# Patient Record
Sex: Female | Born: 1950 | Race: White | Hispanic: No | Marital: Married | Smoking: Former smoker
Health system: Southern US, Community
[De-identification: ages and names within clinical notes are randomized; demographics above are authoritative.]

## PROBLEM LIST (undated history)

## (undated) DIAGNOSIS — R0602 Shortness of breath: Secondary | ICD-10-CM

## (undated) DIAGNOSIS — I10 Essential (primary) hypertension: Secondary | ICD-10-CM

## (undated) DIAGNOSIS — M349 Systemic sclerosis, unspecified: Secondary | ICD-10-CM

## (undated) DIAGNOSIS — M7989 Other specified soft tissue disorders: Secondary | ICD-10-CM

## (undated) DIAGNOSIS — N186 End stage renal disease: Secondary | ICD-10-CM

## (undated) DIAGNOSIS — I4891 Unspecified atrial fibrillation: Secondary | ICD-10-CM

## (undated) HISTORY — PX: NECK SURGERY: SHX720

## (undated) HISTORY — DX: Essential (primary) hypertension: I10

## (undated) HISTORY — PX: APPENDECTOMY: SHX54

---
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
## 2013-08-15 ENCOUNTER — Encounter: Payer: Self-pay | Admitting: Podiatrist

## 2013-08-15 ENCOUNTER — Ambulatory Visit (INDEPENDENT_AMBULATORY_CARE_PROVIDER_SITE_OTHER): Payer: BC Managed Care – PPO

## 2013-08-15 ENCOUNTER — Ambulatory Visit (INDEPENDENT_AMBULATORY_CARE_PROVIDER_SITE_OTHER): Payer: BC Managed Care – PPO | Admitting: Podiatrist

## 2013-08-15 VITALS — BP 164/100 | HR 76 | Resp 12 | Ht 64.0 in | Wt 137.0 lb

## 2013-08-15 DIAGNOSIS — R52 Pain, unspecified: Secondary | ICD-10-CM

## 2013-08-15 DIAGNOSIS — M19079 Primary osteoarthritis, unspecified ankle and foot: Secondary | ICD-10-CM

## 2013-08-15 MED ORDER — MELOXICAM 15 MG PO TABS: 15.0000 mg | ORAL_TABLET | Freq: Every day | ORAL | Status: DC

## 2013-08-15 NOTE — Progress Notes (Signed)
   Subjective:    Patient ID: Joanna Ali, female    DOB: 05/25/1951, 63 y.o.   MRN: [id number]  HPI Comments: ''B/L TOES ARE HURTING ESPECIALLY WHEN WALKING. THIS  BEEN GOING ON FOR 1 YEAR AND ITS GOTTEN WORSE AND TRIED NO TREAMENT.''  Toe Pain    Toe pain bilateral feet-  Pain that the base of the digits and a corn noted medial aspect of bilateral 5th digits.   Review of Systems  All other systems reviewed and are negative.       Objective:   Physical Exam GENERAL APPEARANCE: Alert, conversant. Appropriately groomed. No acute distress.  VASCULAR: Pedal pulses palpable and strong bilateral.  Capillary refill time is immediate to all digits,  Proximal to distal cooling it warm to warm.  Digital hair growth is present bilateral  NEUROLOGIC: sensation is intact epicritically and protectively to 5.07 monofilament at 5/5 sites bilateral.  Light touch is intact bilateral, vibratory sensation intact bilateral, achilles tendon reflex is intact bilateral.  DERMATOLOGIC: skin color, texture, and turger are within normal limits.  No preulcerative lesions are seen, no interdigital maceration noted.  No open lesions present.  Digital nails are asymptomatic.  MUSCULOSKELETAL: mild swelling and deformity noted at the digital level bilateral feet.  Pain at the base of the toes seen bilaterally with the 3,4 digits most symptomatic subjectively.    xrays show arthritic changes at the digital level bilateral.  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  Topical pain cream will be ordered through [facility].  meloxicam was called into the pharmacy.  Discussed injection therapy and she will let me know if she would like to pursue this in the spring before tennis starts back.  At this time the pain isn't severe enough to warrant the therapy per the patient. Also recommended lambswool for  between the 4/5 toes when playing tennis or during activity

## 2013-08-15 NOTE — Patient Instructions (Signed)

## 2013-10-02 ENCOUNTER — Telehealth: Payer: Self-pay | Admitting: *Deleted

## 2013-10-02 NOTE — Telephone Encounter (Signed)
PT CALLED OFFICE STATING THE SHE WAS USING A COMPOUNDED CREAM DR EGERTON HAD MADE FOR HER FOR THE ARTHRITIS IN HER FOOT IT HAS BEEN WORKING WONDERFULLY BUT ALL OF THE SUDDEN SHE HAS DEVELOPED A RASH.  DURING PHONE CALL PT DECIDED THAT SHE WOULD LIKE TO COME IN TO DISCUSS IT WITH THE DR. APPOINTMENT MAKE FOR NEXT AVAILABLE APPOINTMENT TIME.

## 2013-10-03 ENCOUNTER — Encounter: Payer: Self-pay | Admitting: Podiatrist

## 2013-10-03 ENCOUNTER — Ambulatory Visit (INDEPENDENT_AMBULATORY_CARE_PROVIDER_SITE_OTHER): Payer: BC Managed Care – PPO | Admitting: Podiatrist

## 2013-10-03 VITALS — BP 120/78 | HR 80 | Resp 18

## 2013-10-03 DIAGNOSIS — R21 Rash and other nonspecific skin eruption: Secondary | ICD-10-CM

## 2013-10-03 NOTE — Patient Instructions (Signed)
i will call [facility]-- if you havent' heard from them call  [phone] and you can speak directly to the pharmacist. I will call for you today and they should reformulate the medication for you.

## 2013-10-03 NOTE — Progress Notes (Signed)
I came in on Aug 15, 2013 and got my cream and then I noticed this rash on my feet last Friday September 29, 2013 and doesn't itch and feels a little tightness and tender and I stopped using it and called it see if I could get into see Dr. Valentina Lucks   Subjective: Patient presents today for a rash she recently experienced while using the compounding cream. She states it was working very well for her arthritis pain however she recently developed splotchy areas of redness on the top of her feet and toes in the area she was applying a cream. She states it's not uncomfortable however she did become concerned at the site of the rash.  Objective: neurovascular status unchanged.  Mild rash and erythema is present on the dorsum of the forefoot and digits of bilateral feet in the area where she was applying the topical compounded cream   Assessment:  Rash with topical cream  Plan:  Recommended discontinuation of the cream and application of hydrocortisone cream until the rash goes away.  She can then start to use the cream again only one time a day to see if the rash returns.  If it does return, she can call and I will either order her voltaren or similar gel or try another compounding pharmacy with a different base ingredient.

## 2014-10-18 ENCOUNTER — Ambulatory Visit (INDEPENDENT_AMBULATORY_CARE_PROVIDER_SITE_OTHER): Payer: Medicare Other

## 2014-10-18 VITALS — BP 143/86 | HR 94 | Resp 12

## 2014-10-18 DIAGNOSIS — M7730 Calcaneal spur, unspecified foot: Secondary | ICD-10-CM | POA: Diagnosis not present

## 2014-10-18 DIAGNOSIS — R52 Pain, unspecified: Secondary | ICD-10-CM

## 2014-10-18 DIAGNOSIS — M722 Plantar fascial fibromatosis: Secondary | ICD-10-CM | POA: Diagnosis not present

## 2014-10-18 MED ORDER — MELOXICAM 15 MG PO TABS: 15.0000 mg | ORAL_TABLET | Freq: Every day | ORAL | Status: DC

## 2014-10-18 NOTE — Progress Notes (Signed)
   Subjective:    Patient ID: Joanna Ali, female    DOB: Apr 10, 1951, 64 y.o.   MRN: [id number]  HPI  PT STATED RT BOTTOM OF THE HEEL IS BEEN PAINFUL FOR 7 MONTHS. THE HEEL IS SAME BUT WHEN PLAYING TENNIS IT GET WORSE. TRIED THE ICE, STARR BURK SOCKS, AND TAPPING AND IT HELP SOME.  Review of Systems  HENT: Positive for sinus pressure.   Musculoskeletal: Positive for gait problem.  All other systems reviewed and are negative.      Objective:   Physical Exam 64 year old white female well-developed well-nourished oriented 3 presents this time with inferior heel pain on the right heel. Bothering her now for several months patient has been applying physio-tape been stretching using ice taking anti-inflammatories also wearing a night splint or [place] sock although that's been somewhat ineffective. Lower extremity objective findings reveal vascular status to be intact pedal pulses palpable DP +2 PT +2 over 4 Refill timed 3-4 seconds all digits epicritic and proprioceptive sensations intact and symmetric bilateral. There is normal plantar response DTRs not elicited. 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 There is some crepitus and tenderness on palpation range of motion the great toe however most significant pain is on palpation of the medial band plantar fascia just is distal to and at the calcaneal tubercle insertion of the fascia right foot left foot  is asymptomatic and the plantar fascial or retrocalcaneal area       Assessment & Plan:  Assessment this time is plantar fasciitis/heel spur syndrome right foot plan patient is placed in fascial strapping on the right foot maintained for 5 days also recommended a stable shoe she is wearing a pair of sketchers memory foam shoes which have no structure support encouraged her maintain a stiff soled shoe or sandal Birkenstocks crocs for around the house barefoot or flimsy shoes or flip-flops. At this time patient is placed on a regimen of meloxicam 15 oh grams once daily ice to the inferior heel area restrict any ballistic or running activities reevaluate in 2 weeks maintain strapping for 5 days taste as both a test and treatment for the plantar fasciitis patient may be a strong candidate for orthoses based on progress and future evaluation. Dispensed literature on plantar fasciitis and spent some time on education on plantar fasciitis and its management in the future. Follow-up in 2 weeks for reevaluation  Harriet Masson DPM

## 2014-10-18 NOTE — Patient Instructions (Signed)

## 2014-11-01 ENCOUNTER — Ambulatory Visit (INDEPENDENT_AMBULATORY_CARE_PROVIDER_SITE_OTHER): Payer: Medicare Other

## 2014-11-01 VITALS — BP 130/83 | HR 76 | Resp 18

## 2014-11-01 DIAGNOSIS — M722 Plantar fascial fibromatosis: Secondary | ICD-10-CM

## 2014-11-01 DIAGNOSIS — M7730 Calcaneal spur, unspecified foot: Secondary | ICD-10-CM | POA: Diagnosis not present

## 2014-11-01 NOTE — Progress Notes (Signed)
Chief Complaint  Patient presents with  . Plantar Fasciitis    MY RIGHT HEEL ONLY HURTS IN ONE SPOT AND I HAVE BEEN TAPING THEM FOR YEARS     HPI: Patient is 64 y.o. female who presents today for follow-up of plantar fasciitis symptomatology on the plantar lateral aspect of the right foot. She relates that its improved but it's not fully well.   No Known Allergies  Physical Exam  Her vascular status is intact with cubital pedal pulses palpable and sensation intact. High arch/rectus foot type is noted with minimal fat pad present. Plantar lateral aspect of the fascia is painful with palpation. Medial aspect of the plantar fascia is improved.   Assessment: Lateral band plantar fasciitis  Plan: Injected into the area of maximal tenderness with Kenalog and Marcaine mixture under sterile technique without consultation. Recommended  taping and she's been tapering herself. He was also scanned today for orthotics. He will call with orthotics are ready for pickup. She is an avid Curator and I discussed that they will likely fit well in her sneakers but not in her golf shoes.

## 2014-11-22 ENCOUNTER — Encounter: Payer: Self-pay | Admitting: Podiatrist

## 2014-11-22 ENCOUNTER — Ambulatory Visit (INDEPENDENT_AMBULATORY_CARE_PROVIDER_SITE_OTHER): Payer: Medicare Other | Admitting: Podiatrist

## 2014-11-22 VITALS — BP 128/79 | HR 77 | Resp 18

## 2014-11-22 DIAGNOSIS — M722 Plantar fascial fibromatosis: Secondary | ICD-10-CM

## 2014-11-22 NOTE — Progress Notes (Signed)
She presented today to pick up her orthotics. She states her feet are doing well since receiving the last injection. The orthotics are noted to fit and contour her feet and arches nicely. There are dispensed by the medical assistant. She will be seen back for follow-up on as-needed basis or if the orthotics aren't comfortable.

## 2015-01-14 ENCOUNTER — Other Ambulatory Visit: Payer: Self-pay

## 2015-01-14 NOTE — Telephone Encounter (Signed)
Pt needs to be evaluated if continuing to have problems.

## 2015-02-13 ENCOUNTER — Other Ambulatory Visit: Payer: Self-pay | Admitting: Podiatry

## 2015-02-13 NOTE — Telephone Encounter (Signed)
Pt needs an appt prior to future refills. 

## 2015-03-18 ENCOUNTER — Other Ambulatory Visit: Payer: Self-pay | Admitting: Podiatry

## 2015-03-18 NOTE — Telephone Encounter (Signed)
Pt needs an appt prior to future refills. 

## 2015-04-26 ENCOUNTER — Other Ambulatory Visit: Payer: Self-pay | Admitting: Podiatry

## 2019-03-20 ENCOUNTER — Other Ambulatory Visit: Payer: Self-pay

## 2019-03-20 ENCOUNTER — Encounter: Payer: Self-pay | Admitting: Cardiovascular Disease

## 2019-03-20 ENCOUNTER — Ambulatory Visit (INDEPENDENT_AMBULATORY_CARE_PROVIDER_SITE_OTHER): Payer: Medicare Other | Admitting: Cardiovascular Disease

## 2019-03-20 VITALS — BP 120/74 | HR 104 | Temp 98.2°F | Ht 64.0 in | Wt 140.0 lb

## 2019-03-20 DIAGNOSIS — R600 Localized edema: Secondary | ICD-10-CM

## 2019-03-20 DIAGNOSIS — M19042 Primary osteoarthritis, left hand: Secondary | ICD-10-CM

## 2019-03-20 DIAGNOSIS — R000 Tachycardia, unspecified: Secondary | ICD-10-CM

## 2019-03-20 DIAGNOSIS — I10 Essential (primary) hypertension: Secondary | ICD-10-CM | POA: Diagnosis not present

## 2019-03-20 DIAGNOSIS — R0609 Other forms of dyspnea: Secondary | ICD-10-CM | POA: Diagnosis not present

## 2019-03-20 DIAGNOSIS — M19041 Primary osteoarthritis, right hand: Secondary | ICD-10-CM

## 2019-03-20 DIAGNOSIS — R509 Fever, unspecified: Secondary | ICD-10-CM

## 2019-03-20 MED ORDER — FUROSEMIDE 40 MG PO TABS: 40.0000 mg | ORAL_TABLET | Freq: Every day | ORAL | 3 refills | Status: DC

## 2019-03-20 MED ORDER — CARVEDILOL 6.25 MG PO TABS: 6.2500 mg | ORAL_TABLET | Freq: Two times a day (BID) | ORAL | 3 refills | Status: DC

## 2019-03-20 NOTE — Patient Instructions (Signed)
Medication Instructions:  Stop Hydrochlorothiazide Stop Metoprolol. Start Lasix 40 mg daily. Start Carvedilol 6.25 mg twice daily.   If you need a refill on your cardiac medications before your next appointment, please call your pharmacy.   Lab work: Blood culture x2 BNP  If you have labs (blood work) drawn today and your tests are completely normal, you will receive your results only by: [facility] MyChart Message (if you have MyChart) OR . A paper copy in the mail If you have any lab test that is abnormal or we need to change your treatment, we will call you to review the results.  Testing/Procedures: Echocardiogram - Your physician has requested that you have an echocardiogram. Echocardiography is a painless test that uses sound waves to create images of your heart. It provides your doctor with information about the size and shape of your heart and how well your heart's chambers and valves are working. This procedure takes approximately one hour. There are no restrictions for this procedure. This will be performed at our [facility] location - [address], [address].   Follow-Up: At [facility], you and your health needs are our priority.  As part of our continuing mission to provide you with exceptional heart care, we have created designated Provider Care Teams.  These Care Teams include your primary Cardiologist (physician) and Advanced Practice Providers (APPs -  Physician Assistants and Nurse Practitioners) who all work together to provide you with the care you need, when you need it. . Follow up with Dr.Kelly on September 15th at 10:20 (please add to schedule).

## 2019-03-20 NOTE — Progress Notes (Signed)
Cardiology Office Note    Date:  03/22/2019   ID:  Joanna Ali, DOB 1950/09/30, MRN [id number]  PCP:  Octavia Bruckner, MD  Cardiologist:  Shelva Majestic, MD   New cardiology evaluation, referred through the courtesy of Leafy Kindle, [place] at [facility] rheumatology  History of Present Illness:  Joanna Ali is a 68 y.o. female who denies any awareness of prior cardiac history.  She states that up until recently he was able to walk at a good pace and was unaware of any issues.  However, over the past 2 months she has noticed progressive swelling of her lower extremity initiating at the feet and ankles and now moving up to her legs to around her knees.  At least a 20-year history of hypertension.  She also has osteoarthritic issues.  There also is a history of macular degeneration.  She ultimately underwent rheumatologic referral evaluation and was seen at [facility] rheumatology on March 16, 2019 by Leafy Kindle, [place].  The patient was concerned of possibly having toyed arthritis contributing to her issues.  However, on evaluation her blood pressure was 112/66.  He was felt to have no joint erythema edema or warmth but had osteoarthritic changes in her hands and feet.  There were no deformities.  On exam she was noted to have splinter hemorrhages of her fingernails, 3+ pitting edema to her lower extremity and symptoms suggestive of dyspnea on exertion.  She underwent a thorough laboratory evaluation and at present these results are still pending.  Upon further questioning the patient states that she has noticed low-grade temperature every afternoon ranging from 99-100.9.  She denies any night sweats.  She has been on Benicar 40 mg, HCTZ 25 mg.  She has a prescription for Lasix but has not been taking this.  She denies any awareness of COVID exposure.  She is unaware of palpitations.  She denies presyncope or syncope.  She is unaware of any valvular pathology.  She presents for evaluation.   Past Medical  History:  Diagnosis Date   Hypertension     Past Surgical History:  Procedure Laterality Date   NECK SURGERY      Current Medications: Outpatient Medications Prior to Visit  Medication Sig Dispense Refill   acyclovir (ZOVIRAX) 400 MG tablet      BENICAR 40 MG tablet      Multiple Vitamins-Minerals (PRESERVISION AREDS 2) CAPS Take 2 capsules by mouth daily.     TURMERIC PO Take 1 capsule by mouth 2 (two) times daily.     hydrochlorothiazide (HYDRODIURIL) 25 MG tablet      metoprolol (LOPRESSOR) 50 MG tablet      amoxicillin-clavulanate (AUGMENTIN) 875-125 MG per tablet   0   BOOSTRIX 5-2.5-18.5 injection   0   CHERATUSSIN AC 100-10 MG/5ML syrup   0   fluticasone (FLONASE) 50 MCG/ACT nasal spray      meloxicam (MOBIC) 15 MG tablet TAKE 1 TABLET (15 MG TOTAL) BY MOUTH DAILY. 30 tablet 0   predniSONE (DELTASONE) 20 MG tablet Take 40 mg by mouth daily.  0   VAGIFEM 10 MCG TABS vaginal tablet      No facility-administered medications prior to visit.      Allergies:   Patient has no known allergies.   Social History   Socioeconomic History   Marital status: Married    Spouse name: Not on file   Number of children: Not on file   Years of education: Not on file   Highest  education level: Not on file  Occupational History   Not on file  Social Needs   Financial resource strain: Not on file   Food insecurity    Worry: Not on file    Inability: Not on file   Transportation needs    Medical: Not on file    Non-medical: Not on file  Tobacco Use   Smoking status: Former Smoker    Types: Cigarettes    Quit date: 03/20/1979    Years since quitting: 40.0   Smokeless tobacco: Never Used   Tobacco comment: pt states she smoked from her early 42s to early 68s.  Substance and Sexual Activity   Alcohol use: No   Drug use: No   Sexual activity: Not on file  Lifestyle   Physical activity    Days per week: Not on file    Minutes per session: Not on  file   Stress: Not on file  Relationships   Social connections    Talks on phone: Not on file    Gets together: Not on file    Attends religious service: Not on file    Active member of club or organization: Not on file    Attends meetings of clubs or organizations: Not on file    Relationship status: Not on file  Other Topics Concern   Not on file  Social History Narrative   Not on file    Additional social history is notable that she was born in [place].  She is a retired Optometrist.  She is married for 68 years has 1 daughter and 3 grandchildren.  She remotely smoked for short duration for 10 years and quit 40 years ago.  She does drink occasional wine and cocktails.  She enjoys exercise including playing golf and water aerobics.  Family History: Her mother died at age 91 and had esophageal cancer.  She did not know her father but he is deceased.  A maternal grandmother mother had suffered a heart attack a paternal grandmother had lung cancer.  1 brother age 35 is status post CABG surgery x3.  Another sister age 39 is alive and well.  She has 1 daughter age 30  ROS General: Negative; No fevers, chills, or night sweats;  HEENT: Negative; No changes in vision or hearing, sinus congestion, difficulty swallowing Pulmonary: Negative; No cough, wheezing, shortness of breath, hemoptysis Cardiovascular: Negative; No chest pain, presyncope, syncope, palpitations GI: Negative; No nausea, vomiting, diarrhea, or abdominal pain GU: Negative; No dysuria, hematuria, or difficulty voiding Musculoskeletal: Negative; no myalgias, joint pain, or weakness Hematologic/Oncology: Negative; no easy bruising, bleeding Endocrine: Negative; no heat/cold intolerance; no diabetes Neuro: Negative; no changes in balance, headaches Skin: Negative; No rashes or skin lesions Psychiatric: Negative; No behavioral problems, depression Sleep: Negative; No snoring, daytime sleepiness, hypersomnolence, bruxism,  restless legs, hypnogognic hallucinations, no cataplexy Other comprehensive 14 point system review is negative.   PHYSICAL EXAM:   VS:  BP 120/74    Pulse (!) 104    Temp 98.2 F (36.8 C)    Ht 5' 4"  (1.626 m)    Wt 140 lb (63.5 kg)    BMI 24.03 kg/m    Wt Readings from Last 3 Encounters:  03/20/19 140 lb (63.5 kg)  08/15/13 137 lb (62.1 kg)    General: Alert, oriented, no distress.  Skin: normal turgor, no rashes, warm and dry HEENT: Normocephalic, atraumatic. Pupils equal round and reactive to light; sclera anicteric; extraocular muscles intact; no evidence for  conjunctival petechiae; fundi without hemorrhages or exudates Nose without nasal septal hypertrophy Mouth/Parynx benign; Mallinpatti scale 3 Neck: No JVD, no carotid bruits; normal carotid upstroke Lungs: No wheezing, slightly decreased at the bases Chest wall: without tenderness to palpitation Heart: PMI not displaced, tachycardic at heart rate 104, s1 s2 normal, 1/6 systolic murmur, no diastolic murmur, no rubs, gallops, thrills, or heaves Abdomen: soft, nontender; no hepatosplenomehaly, BS+; abdominal aorta nontender and not dilated by palpation. Back: no CVA tenderness Pulses 2+ Musculoskeletal: full range of motion, normal strength, no joint deformities Extremities: 2-3+ lower extremity edema bilaterally to just below her knees; splinter hemorrhages on several nailbeds;  no clubbing cyanosis or edema, Homan's sign negative  Neurologic: grossly nonfocal; Cranial nerves grossly wnl Psychologic: Normal mood and affect   Studies/Labs Reviewed:   EKG:  EKG is ordered today.  ECG (independently read by me): Sinus tachycardia at 104 bpm.  PR interval 128 ms.  Poor anterior R wave progression.  QTc interval 452 ms.  Nonspecific ST changes.  Recent Labs: BMP Latest Ref Rng & Units 03/20/2019  Glucose 65 - 99 mg/dL 98  BUN 8 - 27 mg/dL 22  Creatinine 0.57 - 1.00 mg/dL 0.78  BUN/Creat Ratio 12 - 28 28  Sodium 134 - 144  mmol/L 133(L)  Potassium 3.5 - 5.2 mmol/L 3.1(L)  Chloride 96 - 106 mmol/L 94(L)  CO2 20 - 29 mmol/L 22  Calcium 8.7 - 10.3 mg/dL 8.7     Hepatic Function Latest Ref Rng & Units 03/20/2019  Total Protein 6.0 - 8.5 g/dL 5.2(L)  Albumin 3.8 - 4.8 g/dL 3.2(L)  AST 0 - 40 IU/L 31  ALT 0 - 32 IU/L 16  Alk Phosphatase 39 - 117 IU/L 71  Total Bilirubin 0.0 - 1.2 mg/dL 0.3    CBC Latest Ref Rng & Units 03/20/2019  WBC 3.4 - 10.8 x10E3/uL 5.1  Hemoglobin 11.1 - 15.9 g/dL 11.1  Hematocrit 34.0 - 46.6 % 31.3(L)   Lab Results  Component Value Date   MCV 91 03/20/2019   No results found for: TSH No results found for: HGBA1C   BNP    Component Value Date/Time   BNP 44.1 03/20/2019 1647    ProBNP No results found for: PROBNP   Lipid Panel  No results found for: CHOL, TRIG, HDL, CHOLHDL, VLDL, LDLCALC, LDLDIRECT   RADIOLOGY: No results found.   Additional studies/ records that were reviewed today include:  Reviewed the rheumatology evaluation of March 16, 2019.  We tried calling the office today to obtain results of the laboratory but this was still not available.  ASSESSMENT:    1. Edema of both lower extremities   2. Exertional dyspnea   3. Essential hypertension   4. Sinus tachycardia   5. Fever, unspecified fever cause   6. Osteoarthritis of both hands, unspecified osteoarthritis type     PLAN:  Joanna Ali is a 68 year old female who has a 20-year history of hyper tension, history of osteoarthritis, and for the past 2 months has developed progressive lower extremity edema which has progressed to now 2-3+ involving her lower extremities to just below her knee.  Her ECG today reveals that she is tachycardic with sinus tachycardia at 104 bpm.  Of note she admits to recent development of low-grade fevers in the late afternoon on a daily basis.  She denies any cough.  She denies any change or loss in sense of taste or smell.  She is unaware of any COVID exposure.  Exam also suggests the presence of several splinter hemorrhages on her fingernails.  I did not see any conjunctival petechiae or Roth spots.  I am concerned that she may have a component of heart failure.  A chest x-ray reportedly suggested mild cardiomegaly.  As I was unable to obtain results of the laboratory I will check a brain natruretic peptide, chemistry profile, repeat CBC, and in light of her low-grade fevers I will also check blood cultures.  I am scheduling her for 2D echo Doppler study to evaluate both systolic and diastolic function, valvular architecture make certain there is no evidence for any potential vegetation.  Since I do not know her LV function, I will change her metoprolol tartrate to carvedilol and she will initiate at 6.25 mg twice a day with plans to titrate.  I have recommended she discontinue HCTZ and in its place she will start Lasix 40 mg daily.  If her leg swelling does not improve for several days she may need to take 40 mg twice a day and then resume the 40 mg dose.  I also recommended support stockings with 20 to 30 mm of pressure support.  I was try to obtain the laboratory results from [facility] rheumatology.  I will plan to see her back in the office in 2 weeks for reevaluation.  Medication Adjustments/Labs and Tests Ordered: Current medicines are reviewed at length with the patient today.  Concerns regarding medicines are outlined above.  Medication changes, Labs and Tests ordered today are listed in the Patient Instructions below. Patient Instructions  Medication Instructions:  Stop Hydrochlorothiazide Stop Metoprolol. Start Lasix 40 mg daily. Start Carvedilol 6.25 mg twice daily.   If you need a refill on your cardiac medications before your next appointment, please call your pharmacy.   Lab work: Blood culture x2 BNP  If you have labs (blood work) drawn today and your tests are completely normal, you will receive your results only by:  [place] (if  you have MyChart) OR  A paper copy in the mail If you have any lab test that is abnormal or we need to change your treatment, we will call you to review the results.  Testing/Procedures: Echocardiogram - Your physician has requested that you have an echocardiogram. Echocardiography is a painless test that uses sound waves to create images of your heart. It provides your doctor with information about the size and shape of your heart and how well your hearts chambers and valves are working. This procedure takes approximately one hour. There are no restrictions for this procedure. This will be performed at our [facility] location - [address], [address].   Follow-Up: At [facility], you and your health needs are our priority.  As part of our continuing mission to provide you with exceptional heart care, we have created designated Provider Care Teams.  These Care Teams include your primary Cardiologist (physician) and Advanced Practice Providers (APPs -  Physician Assistants and Nurse Practitioners) who all work together to provide you with the care you need, when you need it.  Follow up with Dr.Tiffony Kite on September 15th at 10:20 (please add to schedule).       Signed, Shelva Majestic, MD  03/22/2019 3:28 PM    [facility] [address], [place], [place], [place]  [address] Phone: [phone]

## 2019-03-21 ENCOUNTER — Other Ambulatory Visit: Payer: Self-pay

## 2019-03-21 DIAGNOSIS — M7989 Other specified soft tissue disorders: Secondary | ICD-10-CM

## 2019-03-21 DIAGNOSIS — R0602 Shortness of breath: Secondary | ICD-10-CM

## 2019-03-21 HISTORY — DX: Shortness of breath: R06.02

## 2019-03-21 HISTORY — DX: Other specified soft tissue disorders: M79.89

## 2019-03-21 MED ORDER — POTASSIUM CHLORIDE CRYS ER 20 MEQ PO TBCR: 20.0000 meq | EXTENDED_RELEASE_TABLET | Freq: Every day | ORAL | 3 refills | Status: DC

## 2019-03-22 ENCOUNTER — Other Ambulatory Visit: Payer: Self-pay

## 2019-03-22 ENCOUNTER — Telehealth: Payer: Self-pay | Admitting: Cardiovascular Disease

## 2019-03-22 ENCOUNTER — Encounter: Payer: Self-pay | Admitting: Cardiovascular Disease

## 2019-03-22 ENCOUNTER — Ambulatory Visit (HOSPITAL_COMMUNITY): Payer: Medicare Other | Attending: Cardiovascular Disease

## 2019-03-22 DIAGNOSIS — R600 Localized edema: Secondary | ICD-10-CM | POA: Diagnosis not present

## 2019-03-22 NOTE — Telephone Encounter (Signed)
Called and discussed Joanna Ali echo with her. Moderate circumferential pericardial effusion without tamponade. Moderate to severe AI. She reports she feels ok no no symptoms. Will have her come see me tomorrow at 9 AM.   Evalina Field, MD

## 2019-03-23 ENCOUNTER — Other Ambulatory Visit: Payer: Self-pay | Admitting: Cardiovascular Disease

## 2019-03-23 ENCOUNTER — Ambulatory Visit (HOSPITAL_COMMUNITY)
Admission: RE | Admit: 2019-03-23 | Discharge: 2019-03-23 | Disposition: A | Discharge status: Home / Self Care | Payer: Medicare Other | Source: Ambulatory Visit | Attending: Cardiovascular Disease | Admitting: Cardiovascular Disease

## 2019-03-23 ENCOUNTER — Ambulatory Visit (HOSPITAL_BASED_OUTPATIENT_CLINIC_OR_DEPARTMENT_OTHER)
Admission: RE | Admit: 2019-03-23 | Discharge: 2019-03-23 | Disposition: A | Discharge status: Home / Self Care | Payer: Medicare Other | Source: Ambulatory Visit | Attending: Cardiovascular Disease | Admitting: Cardiovascular Disease

## 2019-03-23 ENCOUNTER — Ambulatory Visit (INDEPENDENT_AMBULATORY_CARE_PROVIDER_SITE_OTHER): Payer: Medicare Other | Admitting: Cardiovascular Disease

## 2019-03-23 ENCOUNTER — Encounter: Payer: Self-pay | Admitting: Cardiovascular Disease

## 2019-03-23 VITALS — BP 96/52 | HR 87 | Temp 97.1°F | Ht 63.0 in | Wt 138.6 lb

## 2019-03-23 DIAGNOSIS — R0602 Shortness of breath: Secondary | ICD-10-CM | POA: Diagnosis not present

## 2019-03-23 DIAGNOSIS — R600 Localized edema: Secondary | ICD-10-CM

## 2019-03-23 DIAGNOSIS — I351 Nonrheumatic aortic (valve) insufficiency: Secondary | ICD-10-CM

## 2019-03-23 DIAGNOSIS — R230 Cyanosis: Secondary | ICD-10-CM

## 2019-03-23 DIAGNOSIS — R0989 Other specified symptoms and signs involving the circulatory and respiratory systems: Secondary | ICD-10-CM | POA: Diagnosis not present

## 2019-03-23 NOTE — H&P (View-Only) (Signed)
Cardiology Office Note:   Date:  03/23/2019  NAME:  Joanna Ali    MRN: [id number] DOB:  [birth year]   PCP:  Octavia Bruckner, MD  Cardiologist:  No primary care provider on file.  Electrophysiologist:  None   Referring MD: Octavia Bruckner, MD   Chief Complaint  Patient presents with   Shortness of Breath   History of Present Illness:   Joanna Ali is a 68 y.o. female with a hx of hypertension who is being seen today for the evaluation of aortic valve insufficiency, pericardial effusion, lower extremity edema.  She presents for follow-up, she was evaluated by Dr. Ellouise Newer earlier this week.  She has had lower extremity edema, blue toes and blue fingers for nearly 2 months.  She is also had intermittent low-grade fevers around 100.5.  She reports no energy and profound shortness of breath with exertion.  Her legs are extremely swollen today on my examination.  Her primary care physician has instructed her that she has poor venous circulation and instructed her to take Lasix as needed.  She reports a swelling comes and goes but gets worse during the day.  She reports this is not normal for her at all.  Her toes are noticeably blue, including her fingertips as well as tongue.  I read her echocardiogram last night, and this demonstrated a moderate pericardial effusion without tamponade.  There was fibrous material in the pericardial space, and brightening of the RV, this likely represents pericardial fat with epicardial RV involvement.  She was noted to have at least moderate aortic valve regurgitation.  The valve appeared trileaflet, but it is unclear of the etiology of the regurgitation due to the poor quality echocardiogram.  She presents with lab work obtained by her rheumatologist.  A BNP was normal around 59 this was normal repeat here.  ESR 14 CRP 38.  Her thyroid studies were normal as well at 3.3.  Her rheumatoid factor and other rheumatologic studies appear to have been negative as well.  Blood  cultures were obtained by Dr. Claiborne Billings, and those have been negative as well.  Her overall case, is interesting as she has no medical problems prior to this and this appears to be a very serious problem for her without any definitive diagnosis at this time.  She endorses no recent hospitalization no IV drug use or any other risk factors for endocarditis that I can tell.  Past Medical History: Past Medical History:  Diagnosis Date   Hypertension     Past Surgical History: Past Surgical History:  Procedure Laterality Date   NECK SURGERY      Current Medications: Current Meds  Medication Sig   acyclovir (ZOVIRAX) 400 MG tablet    BENICAR 40 MG tablet    carvedilol (COREG) 6.25 MG tablet Take 1 tablet (6.25 mg total) by mouth 2 (two) times daily.   furosemide (LASIX) 40 MG tablet Take 1 tablet (40 mg total) by mouth daily.   Multiple Vitamins-Minerals (PRESERVISION AREDS 2) CAPS Take 2 capsules by mouth daily.   potassium chloride SA (K-DUR) 20 MEQ tablet Take 1 tablet (20 mEq total) by mouth daily.   TURMERIC PO Take 1 capsule by mouth 2 (two) times daily.     Allergies:    Patient has no known allergies.   Social History: Social History   Socioeconomic History   Marital status: Married    Spouse name: Not on file   Number of children: Not on file   Years  of education: Not on file   Highest education level: Not on file  Occupational History   Not on file  Social Needs   Financial resource strain: Not on file   Food insecurity    Worry: Not on file    Inability: Not on file   Transportation needs    Medical: Not on file    Non-medical: Not on file  Tobacco Use   Smoking status: Former Smoker    Types: Cigarettes    Quit date: 03/20/1979    Years since quitting: 40.0   Smokeless tobacco: Never Used   Tobacco comment: pt states she smoked from her early 11s to early 56s.  Substance and Sexual Activity   Alcohol use: No   Drug use: No   Sexual  activity: Not on file  Lifestyle   Physical activity    Days per week: Not on file    Minutes per session: Not on file   Stress: Not on file  Relationships   Social connections    Talks on phone: Not on file    Gets together: Not on file    Attends religious service: Not on file    Active member of club or organization: Not on file    Attends meetings of clubs or organizations: Not on file    Relationship status: Not on file  Other Topics Concern   Not on file  Social History Narrative   Not on file     Family History: The patient's family history includes Hypertension in her mother.  ROS:   All other ROS reviewed and negative. Pertinent positives noted in the HPI.     EKGs/Labs/Other Studies Reviewed:   The following studies were personally reviewed by me today: CRP 38, ESR 14, TSH 3.3, negative rheumatoid factor, BNP noted to be 44  Her echocardiogram performed yesterday personally reviewed by me and read by me demonstrated a moderate pericardial effusion without evidence of tamponade, likely a prominent pericardial fat pad with subsequent epicardial RV fat involvement.  She did have at least moderate degree of aortic insufficiency with unclear etiology.  EKG:  EKG is ordered today.  The ekg ordered today demonstrates normal sinus rhythm, heart rate 90, normal intervals, low voltage noted, nonspecific ST-T abnormalities, poor R wave progression, and was personally reviewed by me.   Recent Labs: 03/20/2019: ALT 16; BNP 44.1; BUN 22; Creatinine, Ser 0.78; Hemoglobin 11.1; Potassium 3.1; Sodium 133   Recent Lipid Panel No results found for: CHOL, TRIG, HDL, CHOLHDL, VLDL, LDLCALC, LDLDIRECT  Physical Exam:   VS:  BP (!) 96/52    Pulse 87    Temp (!) 97.1 F (36.2 C)    Ht 5' 3"  (1.6 m)    Wt 138 lb 9.6 oz (62.9 kg)    SpO2 93%    BMI 24.55 kg/m    Wt Readings from Last 3 Encounters:  03/23/19 138 lb 9.6 oz (62.9 kg)  03/20/19 140 lb (63.5 kg)  08/15/13 137 lb (62.1  kg)    General: Well nourished, well developed, in no acute distress Heart: Atraumatic, normal size  Eyes: PEERLA, EOMI  Neck: Supple, no JVD Endocrine: No thryomegaly Cardiac: Normal S1, S2; faint diastolic rumble noted on expiration Lungs: Clear to auscultation bilaterally, no wheezing, rhonchi or rales  Abd: Soft, nontender, no hepatomegaly  Ext: She has extremely poor pulses, and notable blue toes as well as blue fingertips.  Her tongue also has what appears to be splinter hemorrhages and  a blue discoloration.  She has poor capillary refill in all extremities, there is at least 2+ lower extremity edema Musculoskeletal: No deformities, BUE and BLE strength normal and equal Skin: Warm and dry, no rashes   Neuro: Alert and oriented to person, place, time, and situation, CNII-XII grossly intact, no focal deficits  Psych: Normal mood and affect   ASSESSMENT:   NAME@ is a 68 y.o. female who presents for the following: 1. Shortness of breath   2. Nonrheumatic aortic valve insufficiency   3. Leg edema     PLAN:   1. Shortness of breath 2. Nonrheumatic aortic valve insufficiency 3. LE edema -Her overall picture is concerning for possibly subacute endocarditis.  Given the aortic valve insufficiency which was not well quantified due to poor quality study, I think it is beneficial to proceed with a TEE.  Her blood cultures are negative, but she has evidence of embolization to her fingers and toes.  She also has an abdominal bruit on examination, and I will obtain an abdominal ultrasound of her aorta as well as lower extremity duplexes.  Possibly she has some sort of smoldering rheumatologic process such as giant cell arteritis to explain embolization to her lower extremities.  She does not have classic symptoms of giant cell arteritis or PMR, but something is going on.  Her echocardiogram is likely inconsistent with an aortic dissection, and the pericardial effusion is likely just a result of what  ever is going on. -I will also repeat a CRP, ESR, iron profile, serum ferritin, CBC with platelets, PT/INR in anticipation for above TEE -She has an appoint with Dr. Claiborne Billings on September 15 and we will keep this for now.  Should she need to see me sooner I will be more than happy to work her in.  Disposition: Return She will follow with Dr. Georgina Peer..  Medication Adjustments/Labs and Tests Ordered: Current medicines are reviewed at length with the patient today.  Concerns regarding medicines are outlined above.  Orders Placed This Encounter  Procedures   CBC w/Diff/Platelet   PT and PTT   Fe+TIBC+Fer   Sed Rate (ESR)   C-reactive protein   Basic metabolic panel   EKG 17-CBSW   No orders of the defined types were placed in this encounter.   Patient Instructions  Medication Instructions:  Continue same medications   Lab work: Today Cbc,pt,iron,tibc,ferritin,sed rate,crp,bmet   Testing/Procedures: Schedule lower ext arterial dopplers  Schedule abd/iliac doppler   Schedule TEE  Follow directions below   Follow-Up: At [facility], you and your health needs are our priority.  As part of our continuing mission to provide you with exceptional heart care, we have created designated Provider Care Teams.  These Care Teams include your primary Cardiologist (physician) and Advanced Practice Providers (APPs -  Physician Assistants and Nurse Practitioners) who all work together to provide you with the care you need, when you need it.  Keep appointment as planned with [facility] Tuesday 9/15 at 10:20 am      Your physician has requested that you have a TEE. During a TEE, sound waves are used to create images of your heart. It provides your doctor with information about the size and shape of your heart and how well your hearts chambers and valves are working. In this test, a transducer is attached to the end of a flexible tube thats guided down your throat and into your esophagus  (the tube leading from you mouth to your stomach) to get a  more detailed image of your heart. You are not awake for the procedure. Please see the instruction sheet given to you today. For further information please visit [email or web address].       You are scheduled for a TEE on Tuesday 03/28/19 with Dr.O'Neal.  Please arrive at the [facility] (Main Entrance A) at [facility]: [place], [place] [address] at 9:45 am   DIET: Nothing to eat or drink after midnight except a sip of water with medications (see medication instructions below)  Medication Instructions: Hold Furosemide morning of procedure    Labs: CBC,Bmet 03/23/19  You must have a responsible person to drive you home and stay in the waiting area during your procedure. Failure to do so could result in cancellation.  Bring your insurance cards.  *Special Note: Every effort is made to have your procedure done on time. Occasionally there are emergencies that occur at the hospital that may cause delays. Please be patient if a delay does occur.      Signed, Addison Naegeli. Audie Box, [place]  [address] [place], [place] [address] [phone]  03/23/2019 11:35 AM

## 2019-03-23 NOTE — Progress Notes (Addendum)
Cardiology Office Note:   Date:  03/23/2019  NAME:  Joanna Ali    MRN: [id number] DOB:  02/27/1951   PCP:  Octavia Bruckner, MD  Cardiologist:  No primary care provider on file.  Electrophysiologist:  None   Referring MD: Octavia Bruckner, MD   Chief Complaint  Patient presents with   Shortness of Breath   History of Present Illness:   Joanna Ali is a 68 y.o. female with a hx of hypertension who is being seen today for the evaluation of aortic valve insufficiency, pericardial effusion, lower extremity edema.  She presents for follow-up, she was evaluated by Dr. Ellouise Newer earlier this week.  She has had lower extremity edema, blue toes and blue fingers for nearly 2 months.  She is also had intermittent low-grade fevers around 100.5.  She reports no energy and profound shortness of breath with exertion.  Her legs are extremely swollen today on my examination.  Her primary care physician has instructed her that she has poor venous circulation and instructed her to take Lasix as needed.  She reports a swelling comes and goes but gets worse during the day.  She reports this is not normal for her at all.  Her toes are noticeably blue, including her fingertips as well as tongue.  I read her echocardiogram last night, and this demonstrated a moderate pericardial effusion without tamponade.  There was fibrous material in the pericardial space, and brightening of the RV, this likely represents pericardial fat with epicardial RV involvement.  She was noted to have at least moderate aortic valve regurgitation.  The valve appeared trileaflet, but it is unclear of the etiology of the regurgitation due to the poor quality echocardiogram.  She presents with lab work obtained by her rheumatologist.  A BNP was normal around 59 this was normal repeat here.  ESR 14 CRP 38.  Her thyroid studies were normal as well at 3.3.  Her rheumatoid factor and other rheumatologic studies appear to have been negative as well.  Blood  cultures were obtained by Dr. Claiborne Billings, and those have been negative as well.  Her overall case, is interesting as she has no medical problems prior to this and this appears to be a very serious problem for her without any definitive diagnosis at this time.  She endorses no recent hospitalization no IV drug use or any other risk factors for endocarditis that I can tell.  Past Medical History: Past Medical History:  Diagnosis Date   Hypertension     Past Surgical History: Past Surgical History:  Procedure Laterality Date   NECK SURGERY      Current Medications: Current Meds  Medication Sig   acyclovir (ZOVIRAX) 400 MG tablet    BENICAR 40 MG tablet    carvedilol (COREG) 6.25 MG tablet Take 1 tablet (6.25 mg total) by mouth 2 (two) times daily.   furosemide (LASIX) 40 MG tablet Take 1 tablet (40 mg total) by mouth daily.   Multiple Vitamins-Minerals (PRESERVISION AREDS 2) CAPS Take 2 capsules by mouth daily.   potassium chloride SA (K-DUR) 20 MEQ tablet Take 1 tablet (20 mEq total) by mouth daily.   TURMERIC PO Take 1 capsule by mouth 2 (two) times daily.     Allergies:    Patient has no known allergies.   Social History: Social History   Socioeconomic History   Marital status: Married    Spouse name: Not on file   Number of children: Not on file   Years  of education: Not on file   Highest education level: Not on file  Occupational History   Not on file  Social Needs   Financial resource strain: Not on file   Food insecurity    Worry: Not on file    Inability: Not on file   Transportation needs    Medical: Not on file    Non-medical: Not on file  Tobacco Use   Smoking status: Former Smoker    Types: Cigarettes    Quit date: 03/20/1979    Years since quitting: 40.0   Smokeless tobacco: Never Used   Tobacco comment: pt states she smoked from her early 66s to early 45s.  Substance and Sexual Activity   Alcohol use: No   Drug use: No   Sexual  activity: Not on file  Lifestyle   Physical activity    Days per week: Not on file    Minutes per session: Not on file   Stress: Not on file  Relationships   Social connections    Talks on phone: Not on file    Gets together: Not on file    Attends religious service: Not on file    Active member of club or organization: Not on file    Attends meetings of clubs or organizations: Not on file    Relationship status: Not on file  Other Topics Concern   Not on file  Social History Narrative   Not on file     Family History: The patient's family history includes Hypertension in her mother.  ROS:   All other ROS reviewed and negative. Pertinent positives noted in the HPI.     EKGs/Labs/Other Studies Reviewed:   The following studies were personally reviewed by me today: CRP 38, ESR 14, TSH 3.3, negative rheumatoid factor, BNP noted to be 44  Her echocardiogram performed yesterday personally reviewed by me and read by me demonstrated a moderate pericardial effusion without evidence of tamponade, likely a prominent pericardial fat pad with subsequent epicardial RV fat involvement.  She did have at least moderate degree of aortic insufficiency with unclear etiology.  EKG:  EKG is ordered today.  The ekg ordered today demonstrates normal sinus rhythm, heart rate 90, normal intervals, low voltage noted, nonspecific ST-T abnormalities, poor R wave progression, and was personally reviewed by me.   Recent Labs: 03/20/2019: ALT 16; BNP 44.1; BUN 22; Creatinine, Ser 0.78; Hemoglobin 11.1; Potassium 3.1; Sodium 133   Recent Lipid Panel No results found for: CHOL, TRIG, HDL, CHOLHDL, VLDL, LDLCALC, LDLDIRECT  Physical Exam:   VS:  BP (!) 96/52    Pulse 87    Temp (!) 97.1 F (36.2 C)    Ht 5' 3"  (1.6 m)    Wt 138 lb 9.6 oz (62.9 kg)    SpO2 93%    BMI 24.55 kg/m    Wt Readings from Last 3 Encounters:  03/23/19 138 lb 9.6 oz (62.9 kg)  03/20/19 140 lb (63.5 kg)  08/15/13 137 lb (62.1  kg)    General: Well nourished, well developed, in no acute distress Heart: Atraumatic, normal size  Eyes: PEERLA, EOMI  Neck: Supple, no JVD Endocrine: No thryomegaly Cardiac: Normal S1, S2; faint diastolic rumble noted on expiration Lungs: Clear to auscultation bilaterally, no wheezing, rhonchi or rales  Abd: Soft, nontender, no hepatomegaly  Ext: She has extremely poor pulses, and notable blue toes as well as blue fingertips.  Her tongue also has what appears to be splinter hemorrhages and  a blue discoloration.  She has poor capillary refill in all extremities, there is at least 2+ lower extremity edema Musculoskeletal: No deformities, BUE and BLE strength normal and equal Skin: Warm and dry, no rashes   Neuro: Alert and oriented to person, place, time, and situation, CNII-XII grossly intact, no focal deficits  Psych: Normal mood and affect   ASSESSMENT:   NAME@ is a 68 y.o. female who presents for the following: 1. Shortness of breath   2. Nonrheumatic aortic valve insufficiency   3. Leg edema     PLAN:   1. Shortness of breath 2. Nonrheumatic aortic valve insufficiency 3. LE edema -Her overall picture is concerning for possibly subacute endocarditis.  Given the aortic valve insufficiency which was not well quantified due to poor quality study, I think it is beneficial to proceed with a TEE.  Her blood cultures are negative, but she has evidence of embolization to her fingers and toes.  She also has an abdominal bruit on examination, and I will obtain an abdominal ultrasound of her aorta as well as lower extremity duplexes.  Possibly she has some sort of smoldering rheumatologic process such as giant cell arteritis to explain embolization to her lower extremities.  She does not have classic symptoms of giant cell arteritis or PMR, but something is going on.  Her echocardiogram is likely inconsistent with an aortic dissection, and the pericardial effusion is likely just a result of what  ever is going on. -I will also repeat a CRP, ESR, iron profile, serum ferritin, CBC with platelets, PT/INR in anticipation for above TEE -She has an appoint with Dr. Claiborne Billings on September 15 and we will keep this for now.  Should she need to see me sooner I will be more than happy to work her in.  Disposition: Return She will follow with Dr. Georgina Peer..  Medication Adjustments/Labs and Tests Ordered: Current medicines are reviewed at length with the patient today.  Concerns regarding medicines are outlined above.  Orders Placed This Encounter  Procedures   CBC w/Diff/Platelet   PT and PTT   Fe+TIBC+Fer   Sed Rate (ESR)   C-reactive protein   Basic metabolic panel   EKG 85-YIFO   No orders of the defined types were placed in this encounter.   Patient Instructions  Medication Instructions:  Continue same medications   Lab work: Today Cbc,pt,iron,tibc,ferritin,sed rate,crp,bmet   Testing/Procedures: Schedule lower ext arterial dopplers  Schedule abd/iliac doppler   Schedule TEE  Follow directions below   Follow-Up: At [facility], you and your health needs are our priority.  As part of our continuing mission to provide you with exceptional heart care, we have created designated Provider Care Teams.  These Care Teams include your primary Cardiologist (physician) and Advanced Practice Providers (APPs -  Physician Assistants and Nurse Practitioners) who all work together to provide you with the care you need, when you need it.  Keep appointment as planned with [facility] Tuesday 9/15 at 10:20 am      Your physician has requested that you have a TEE. During a TEE, sound waves are used to create images of your heart. It provides your doctor with information about the size and shape of your heart and how well your hearts chambers and valves are working. In this test, a transducer is attached to the end of a flexible tube thats guided down your throat and into your esophagus  (the tube leading from you mouth to your stomach) to get a  more detailed image of your heart. You are not awake for the procedure. Please see the instruction sheet given to you today. For further information please visit [email or web address].       You are scheduled for a TEE on Tuesday 03/28/19 with Dr.O'Neal.  Please arrive at the [facility] (Main Entrance A) at [facility]: [place], [place] [address] at 9:45 am   DIET: Nothing to eat or drink after midnight except a sip of water with medications (see medication instructions below)  Medication Instructions: Hold Furosemide morning of procedure    Labs: CBC,Bmet 03/23/19  You must have a responsible person to drive you home and stay in the waiting area during your procedure. Failure to do so could result in cancellation.  Bring your insurance cards.  *Special Note: Every effort is made to have your procedure done on time. Occasionally there are emergencies that occur at the hospital that may cause delays. Please be patient if a delay does occur.      Signed, Addison Naegeli. Audie Box, [place]  [address], [place], [place] [address] [phone]  03/23/2019 11:35 AM

## 2019-03-23 NOTE — Patient Instructions (Addendum)
Medication Instructions:  Continue same medications   Lab work: Today Cbc,pt,iron,tibc,ferritin,sed rate,crp,bmet   Testing/Procedures: Schedule lower ext arterial dopplers  Schedule abd/iliac doppler   Schedule TEE  Follow directions below   Follow-Up: At [facility], you and your health needs are our priority.  As part of our continuing mission to provide you with exceptional heart care, we have created designated Provider Care Teams.  These Care Teams include your primary Cardiologist (physician) and Advanced Practice Providers (APPs -  Physician Assistants and Nurse Practitioners) who all work together to provide you with the care you need, when you need it. Marland Kitchen Keep appointment as planned with [facility] Tuesday 9/15 at 10:20 am      Your physician has requested that you have a TEE. During a TEE, sound waves are used to create images of your heart. It provides your doctor with information about the size and shape of your heart and how well your heart's chambers and valves are working. In this test, a transducer is attached to the end of a flexible tube that's guided down your throat and into your esophagus (the tube leading from you mouth to your stomach) to get a more detailed image of your heart. You are not awake for the procedure. Please see the instruction sheet given to you today. For further information please visit [email or web address].       You are scheduled for a TEE on Tuesday 03/28/19 with Dr.O'Neal.  Please arrive at the [facility] (Main Entrance A) at [facility]: [place], [place] [address] at 9:45 am   DIET: Nothing to eat or drink after midnight except a sip of water with medications (see medication instructions below)  Medication Instructions: Hold Furosemide morning of procedure    Labs: CBC,Bmet 03/23/19  You must have a responsible person to drive you home and stay in the waiting area during your procedure. Failure to do so could  result in cancellation.  Bring your insurance cards.  *Special Note: Every effort is made to have your procedure done on time. Occasionally there are emergencies that occur at the hospital that may cause delays. Please be patient if a delay does occur.

## 2019-03-24 LAB — CBC WITH DIFFERENTIAL/PLATELET
Basophils Absolute: 0 10*3/uL (ref 0.0–0.2)
Basos: 1 %
EOS (ABSOLUTE): 0.2 10*3/uL (ref 0.0–0.4)
Eos: 4 %
Hematocrit: 33.3 % — ABNORMAL LOW (ref 34.0–46.6)
Hemoglobin: 11.2 g/dL (ref 11.1–15.9)
Immature Grans (Abs): 0 10*3/uL (ref 0.0–0.1)
Immature Granulocytes: 1 %
Lymphocytes Absolute: 1.2 10*3/uL (ref 0.7–3.1)
Lymphs: 21 %
MCH: 31.4 pg (ref 26.6–33.0)
MCHC: 33.6 g/dL (ref 31.5–35.7)
MCV: 93 fL (ref 79–97)
Monocytes Absolute: 0.5 10*3/uL (ref 0.1–0.9)
Monocytes: 9 %
Neutrophils Absolute: 3.9 10*3/uL (ref 1.4–7.0)
Neutrophils: 64 %
Platelets: 243 10*3/uL (ref 150–450)
RBC: 3.57 x10E6/uL — ABNORMAL LOW (ref 3.77–5.28)
RDW: 11.9 % (ref 11.7–15.4)
WBC: 5.9 10*3/uL (ref 3.4–10.8)

## 2019-03-24 LAB — BASIC METABOLIC PANEL
BUN/Creatinine Ratio: 28 (ref 12–28)
BUN: 23 mg/dL (ref 8–27)
CO2: 21 mmol/L (ref 20–29)
Calcium: 8.7 mg/dL (ref 8.7–10.3)
Chloride: 95 mmol/L — ABNORMAL LOW (ref 96–106)
Creatinine, Ser: 0.81 mg/dL (ref 0.57–1.00)
GFR calc Af Amer: 85 mL/min/{1.73_m2} (ref >59)
GFR calc non Af Amer: 74 mL/min/{1.73_m2} (ref >59)
Glucose: 90 mg/dL (ref 65–99)
Potassium: 3.5 mmol/L (ref 3.5–5.2)
Sodium: 134 mmol/L (ref 134–144)

## 2019-03-24 LAB — IRON,TIBC AND FERRITIN PANEL
Ferritin: 699 ng/mL — ABNORMAL HIGH (ref 15–150)
Iron Saturation: 17 % (ref 15–55)
Iron: 37 ug/dL (ref 27–139)
Total Iron Binding Capacity: 213 ug/dL — ABNORMAL LOW (ref 250–450)
UIBC: 176 ug/dL (ref 118–369)

## 2019-03-24 LAB — PT AND PTT
INR: 1.1 (ref 0.8–1.2)
Prothrombin Time: 11.5 s (ref 9.1–12.0)
aPTT: 28 s (ref 24–33)

## 2019-03-24 LAB — C-REACTIVE PROTEIN: CRP: 37 mg/L — ABNORMAL HIGH (ref 0–10)

## 2019-03-24 LAB — SEDIMENTATION RATE: Sed Rate: 5 mm/hr (ref 0–40)

## 2019-03-25 ENCOUNTER — Other Ambulatory Visit (HOSPITAL_COMMUNITY)
Admission: RE | Admit: 2019-03-25 | Discharge: 2019-03-25 | Disposition: A | Discharge status: Home / Self Care | Payer: Medicare Other | Source: Ambulatory Visit | Attending: Cardiovascular Disease | Admitting: Cardiovascular Disease

## 2019-03-25 DIAGNOSIS — Z20828 Contact with and (suspected) exposure to other viral communicable diseases: Secondary | ICD-10-CM | POA: Diagnosis not present

## 2019-03-25 DIAGNOSIS — Z01812 Encounter for preprocedural laboratory examination: Secondary | ICD-10-CM | POA: Diagnosis present

## 2019-03-26 LAB — COMPREHENSIVE METABOLIC PANEL
ALT: 16 IU/L (ref 0–32)
AST: 31 IU/L (ref 0–40)
Albumin/Globulin Ratio: 1.6 (ref 1.2–2.2)
Albumin: 3.2 g/dL — ABNORMAL LOW (ref 3.8–4.8)
Alkaline Phosphatase: 71 IU/L (ref 39–117)
BUN/Creatinine Ratio: 28 (ref 12–28)
BUN: 22 mg/dL (ref 8–27)
Bilirubin Total: 0.3 mg/dL (ref 0.0–1.2)
CO2: 22 mmol/L (ref 20–29)
Calcium: 8.7 mg/dL (ref 8.7–10.3)
Chloride: 94 mmol/L — ABNORMAL LOW (ref 96–106)
Creatinine, Ser: 0.78 mg/dL (ref 0.57–1.00)
GFR calc Af Amer: 89 mL/min/{1.73_m2} (ref >59)
GFR calc non Af Amer: 77 mL/min/{1.73_m2} (ref >59)
Globulin, Total: 2 g/dL (ref 1.5–4.5)
Glucose: 98 mg/dL (ref 65–99)
Potassium: 3.1 mmol/L — ABNORMAL LOW (ref 3.5–5.2)
Sodium: 133 mmol/L — ABNORMAL LOW (ref 134–144)
Total Protein: 5.2 g/dL — ABNORMAL LOW (ref 6.0–8.5)

## 2019-03-26 LAB — CBC WITH DIFFERENTIAL
Basophils Absolute: 0 10*3/uL (ref 0.0–0.2)
Basos: 0 %
EOS (ABSOLUTE): 0.1 10*3/uL (ref 0.0–0.4)
Eos: 3 %
Hematocrit: 31.3 % — ABNORMAL LOW (ref 34.0–46.6)
Hemoglobin: 11.1 g/dL (ref 11.1–15.9)
Immature Grans (Abs): 0 10*3/uL (ref 0.0–0.1)
Immature Granulocytes: 1 %
Lymphocytes Absolute: 1.1 10*3/uL (ref 0.7–3.1)
Lymphs: 21 %
MCH: 32.4 pg (ref 26.6–33.0)
MCHC: 35.5 g/dL (ref 31.5–35.7)
MCV: 91 fL (ref 79–97)
Monocytes Absolute: 0.5 10*3/uL (ref 0.1–0.9)
Monocytes: 9 %
Neutrophils Absolute: 3.4 10*3/uL (ref 1.4–7.0)
Neutrophils: 66 %
RBC: 3.43 x10E6/uL — ABNORMAL LOW (ref 3.77–5.28)
RDW: 11.8 % (ref 11.7–15.4)
WBC: 5.1 10*3/uL (ref 3.4–10.8)

## 2019-03-26 LAB — NOVEL CORONAVIRUS, NAA (HOSP ORDER, SEND-OUT TO REF LAB; TAT 18-24 HRS): SARS-CoV-2, NAA: NOT DETECTED

## 2019-03-26 LAB — CULTURE, BLOOD (SINGLE)

## 2019-03-26 LAB — BRAIN NATRIURETIC PEPTIDE: BNP: 44.1 pg/mL (ref 0.0–100.0)

## 2019-03-28 ENCOUNTER — Ambulatory Visit (HOSPITAL_BASED_OUTPATIENT_CLINIC_OR_DEPARTMENT_OTHER)
Admission: RE | Admit: 2019-03-28 | Discharge: 2019-03-28 | Disposition: A | Discharge status: Home / Self Care | Payer: Medicare Other | Source: Home / Self Care | Attending: Cardiovascular Disease | Admitting: Cardiovascular Disease

## 2019-03-28 ENCOUNTER — Ambulatory Visit (HOSPITAL_COMMUNITY)
Admission: RE | Admit: 2019-03-28 | Discharge: 2019-03-28 | Disposition: A | Discharge status: Home / Self Care | Payer: Medicare Other | Attending: Cardiovascular Disease | Admitting: Cardiovascular Disease

## 2019-03-28 ENCOUNTER — Encounter (HOSPITAL_COMMUNITY): Payer: Self-pay | Admitting: Emergency Medicine

## 2019-03-28 ENCOUNTER — Encounter (HOSPITAL_COMMUNITY)
Admission: RE | Disposition: A | Discharge status: Home / Self Care | Payer: Self-pay | Source: Home / Self Care | Attending: Cardiovascular Disease

## 2019-03-28 ENCOUNTER — Other Ambulatory Visit: Payer: Self-pay

## 2019-03-28 DIAGNOSIS — R0602 Shortness of breath: Secondary | ICD-10-CM | POA: Diagnosis not present

## 2019-03-28 DIAGNOSIS — I10 Essential (primary) hypertension: Secondary | ICD-10-CM | POA: Insufficient documentation

## 2019-03-28 DIAGNOSIS — I340 Nonrheumatic mitral (valve) insufficiency: Secondary | ICD-10-CM | POA: Diagnosis not present

## 2019-03-28 DIAGNOSIS — I313 Pericardial effusion (noninflammatory): Secondary | ICD-10-CM | POA: Insufficient documentation

## 2019-03-28 DIAGNOSIS — Z8249 Family history of ischemic heart disease and other diseases of the circulatory system: Secondary | ICD-10-CM | POA: Insufficient documentation

## 2019-03-28 DIAGNOSIS — R600 Localized edema: Secondary | ICD-10-CM | POA: Diagnosis not present

## 2019-03-28 DIAGNOSIS — Z79899 Other long term (current) drug therapy: Secondary | ICD-10-CM | POA: Insufficient documentation

## 2019-03-28 DIAGNOSIS — I351 Nonrheumatic aortic (valve) insufficiency: Secondary | ICD-10-CM | POA: Insufficient documentation

## 2019-03-28 DIAGNOSIS — Z87891 Personal history of nicotine dependence: Secondary | ICD-10-CM | POA: Diagnosis not present

## 2019-03-28 HISTORY — PX: TEE WITHOUT CARDIOVERSION: SHX5443

## 2019-03-28 SURGERY — ECHOCARDIOGRAM, TRANSESOPHAGEAL
Anesthesia: Moderate Sedation

## 2019-03-28 MED ORDER — MIDAZOLAM HCL (PF) 5 MG/ML IJ SOLN
INTRAMUSCULAR | Status: DC | PRN
  Administered 2019-03-28: 2 mg via INTRAVENOUS
  Administered 2019-03-28 (×2): 1 mg via INTRAVENOUS

## 2019-03-28 MED ORDER — SODIUM CHLORIDE 0.9 % IV SOLN
INTRAVENOUS | Status: DC
  Administered 2019-03-28: 10:00:00 via INTRAVENOUS

## 2019-03-28 MED ORDER — BUTAMBEN-TETRACAINE-BENZOCAINE 2-2-14 % EX AERO
INHALATION_SPRAY | CUTANEOUS | Status: DC | PRN
  Administered 2019-03-28: 1 via TOPICAL

## 2019-03-28 MED ORDER — MIDAZOLAM HCL (PF) 5 MG/ML IJ SOLN
INTRAMUSCULAR | Status: AC
  Filled 2019-03-28: qty 2

## 2019-03-28 MED ORDER — LIDOCAINE VISCOUS HCL 2 % MT SOLN
OROMUCOSAL | Status: DC | PRN
  Administered 2019-03-28: 10 mL via OROMUCOSAL

## 2019-03-28 MED ORDER — FENTANYL CITRATE (PF) 100 MCG/2ML IJ SOLN
INTRAMUSCULAR | Status: AC
  Filled 2019-03-28: qty 2

## 2019-03-28 MED ORDER — FENTANYL CITRATE (PF) 100 MCG/2ML IJ SOLN
INTRAMUSCULAR | Status: DC | PRN
  Administered 2019-03-28 (×2): 25 ug via INTRAVENOUS

## 2019-03-28 MED ORDER — LIDOCAINE VISCOUS HCL 2 % MT SOLN
OROMUCOSAL | Status: AC
  Filled 2019-03-28: qty 15

## 2019-03-28 NOTE — CV Procedure (Signed)
    TRANSESOPHAGEAL ECHOCARDIOGRAM   NAME:  Joanna Ali    MRN: [id number] DOB:  [birth year]    ADMIT DATE: 03/28/2019  INDICATIONS: Aortic valve regurgitation/concerns for endocarditis   PROCEDURE:   Informed consent was obtained prior to the procedure. The risks, benefits and alternatives for the procedure were discussed and the patient comprehended these risks.  Risks include, but are not limited to, cough, sore throat, vomiting, nausea, somnolence, esophageal and stomach trauma or perforation, bleeding, low blood pressure, aspiration, pneumonia, infection, trauma to the teeth and death.    Procedural time out performed. The oropharynx was anesthetized with topical 1% benzocaine.    During this procedure the patient is administered a total of Versed 3 mg and Fentanyl 75 mcg to achieve and maintain moderate conscious sedation.  The patient's heart rate, blood pressure, and oxygen saturation are monitored continuously during the procedure. The period of conscious sedation is 25 minutes, of which I was present face-to-face 100% of this time.   The transesophageal probe was inserted in the esophagus and stomach without difficulty and multiple views were obtained.   COMPLICATIONS:    There were no immediate complications.  KEY FINDINGS: Moderate aortic valve regurgitation. No dissection.    LVEF ~65%.   No evidence of infective endocarditis.   Full report to follow. Further management per primary team.   Lake Bells T. Audie Box, [place]  [address], [place], [place] [address] [phone]  11:19 AM

## 2019-03-28 NOTE — Progress Notes (Signed)
  Echocardiogram Echocardiogram Transesophageal has been performed.  Joanna Ali 03/28/2019, 11:22 AM

## 2019-03-28 NOTE — Interval H&P Note (Signed)
History and Physical Interval Note:  03/28/2019 9:50 AM  Radonna Ricker  has presented today for surgery, with the diagnosis of AORIC VALVE INSUFFICIENCY.  The various methods of treatment have been discussed with the patient and family. After consideration of risks, benefits and other options for treatment, the patient has consented to  Procedure(s): TRANSESOPHAGEAL ECHOCARDIOGRAM (TEE) (N/A) as a surgical intervention.  The patient's history has been reviewed, patient examined, no change in status, stable for surgery.  I have reviewed the patient's chart and labs.  Questions were answered to the patient's satisfaction.     [place] Bells T O'Neal

## 2019-03-28 NOTE — Discharge Instructions (Signed)
Transesophageal Echocardiogram Transesophageal echocardiogram (TEE) is a test that uses sound waves to take pictures of your heart. TEE is done by passing a flexible tube down the esophagus. The esophagus is the tube that carries food from the throat to the stomach. The pictures give detailed images of your heart. This can help your doctor see if there are problems with your heart.  What happens after the procedure?   Your blood pressure, heart rate, breathing rate, and blood oxygen level will be watched until the medicines you were given have worn off.  When you first wake up, your throat may feel sore and numb. This will get better over time. You will not be allowed to eat or drink until the numbness has gone away.  Do not drive for 24 hours if you were given a medicine to help you relax. Summary  TEE is a test that uses sound waves to take pictures of your heart.  You will be given a medicine to help you relax.  Do not drive for 24 hours if you were given a medicine to help you relax. This information is not intended to replace advice given to you by your health care provider. Make sure you discuss any questions you have with your health care provider. Document Released: 05/03/2009 Document Revised: 03/25/2018 Document Reviewed: 10/07/2016 Elsevier Patient Education  2020 [employer].

## 2019-03-29 ENCOUNTER — Encounter (HOSPITAL_COMMUNITY): Payer: Self-pay | Admitting: Cardiovascular Disease

## 2019-03-29 ENCOUNTER — Telehealth: Payer: Self-pay | Admitting: Adult Health

## 2019-03-29 NOTE — Telephone Encounter (Signed)
Received a new referral from [facility] for abnl spep. Joanna Ali has been cld and scheduled to see Colbert Coyer on 9/17 at 9am. Pt aware to arrive 15 minutes early.

## 2019-04-04 ENCOUNTER — Inpatient Hospital Stay (HOSPITAL_COMMUNITY)
Admission: AD | Admit: 2019-04-04 | Discharge: 2019-04-07 | DRG: 844 | Disposition: A | Discharge status: Home / Self Care | Payer: Medicare Other | Source: Ambulatory Visit | Attending: Cardiovascular Disease | Admitting: Cardiovascular Disease

## 2019-04-04 ENCOUNTER — Ambulatory Visit (INDEPENDENT_AMBULATORY_CARE_PROVIDER_SITE_OTHER): Payer: Medicare Other | Admitting: Cardiovascular Disease

## 2019-04-04 ENCOUNTER — Encounter (HOSPITAL_COMMUNITY): Payer: Self-pay | Admitting: General Practice

## 2019-04-04 ENCOUNTER — Other Ambulatory Visit: Payer: Self-pay

## 2019-04-04 ENCOUNTER — Encounter: Payer: Self-pay | Admitting: Cardiovascular Disease

## 2019-04-04 VITALS — BP 124/80 | HR 111 | Ht 63.0 in | Wt 142.0 lb

## 2019-04-04 DIAGNOSIS — R509 Fever, unspecified: Secondary | ICD-10-CM | POA: Diagnosis not present

## 2019-04-04 DIAGNOSIS — R609 Edema, unspecified: Secondary | ICD-10-CM | POA: Diagnosis not present

## 2019-04-04 DIAGNOSIS — R600 Localized edema: Secondary | ICD-10-CM | POA: Diagnosis not present

## 2019-04-04 DIAGNOSIS — I351 Nonrheumatic aortic (valve) insufficiency: Secondary | ICD-10-CM | POA: Diagnosis present

## 2019-04-04 DIAGNOSIS — E8770 Fluid overload, unspecified: Secondary | ICD-10-CM | POA: Diagnosis present

## 2019-04-04 DIAGNOSIS — G5712 Meralgia paresthetica, left lower limb: Secondary | ICD-10-CM | POA: Diagnosis not present

## 2019-04-04 DIAGNOSIS — Z20828 Contact with and (suspected) exposure to other viral communicable diseases: Secondary | ICD-10-CM | POA: Diagnosis present

## 2019-04-04 DIAGNOSIS — I313 Pericardial effusion (noninflammatory): Secondary | ICD-10-CM | POA: Diagnosis present

## 2019-04-04 DIAGNOSIS — Z87891 Personal history of nicotine dependence: Secondary | ICD-10-CM | POA: Diagnosis not present

## 2019-04-04 DIAGNOSIS — H3530 Unspecified macular degeneration: Secondary | ICD-10-CM | POA: Diagnosis present

## 2019-04-04 DIAGNOSIS — I361 Nonrheumatic tricuspid (valve) insufficiency: Secondary | ICD-10-CM | POA: Diagnosis not present

## 2019-04-04 DIAGNOSIS — I3139 Other pericardial effusion (noninflammatory): Secondary | ICD-10-CM

## 2019-04-04 DIAGNOSIS — R531 Weakness: Secondary | ICD-10-CM | POA: Diagnosis not present

## 2019-04-04 DIAGNOSIS — M1990 Unspecified osteoarthritis, unspecified site: Secondary | ICD-10-CM | POA: Diagnosis present

## 2019-04-04 DIAGNOSIS — G4700 Insomnia, unspecified: Secondary | ICD-10-CM | POA: Diagnosis present

## 2019-04-04 DIAGNOSIS — R0602 Shortness of breath: Secondary | ICD-10-CM | POA: Diagnosis not present

## 2019-04-04 DIAGNOSIS — Z8249 Family history of ischemic heart disease and other diseases of the circulatory system: Secondary | ICD-10-CM

## 2019-04-04 DIAGNOSIS — I10 Essential (primary) hypertension: Secondary | ICD-10-CM | POA: Diagnosis not present

## 2019-04-04 DIAGNOSIS — E8809 Other disorders of plasma-protein metabolism, not elsewhere classified: Principal | ICD-10-CM | POA: Diagnosis present

## 2019-04-04 DIAGNOSIS — J90 Pleural effusion, not elsewhere classified: Secondary | ICD-10-CM | POA: Diagnosis not present

## 2019-04-04 DIAGNOSIS — Z79899 Other long term (current) drug therapy: Secondary | ICD-10-CM | POA: Diagnosis not present

## 2019-04-04 DIAGNOSIS — G5713 Meralgia paresthetica, bilateral lower limbs: Secondary | ICD-10-CM | POA: Diagnosis present

## 2019-04-04 DIAGNOSIS — D472 Monoclonal gammopathy: Secondary | ICD-10-CM | POA: Diagnosis present

## 2019-04-04 DIAGNOSIS — R601 Generalized edema: Secondary | ICD-10-CM | POA: Diagnosis present

## 2019-04-04 DIAGNOSIS — I340 Nonrheumatic mitral (valve) insufficiency: Secondary | ICD-10-CM | POA: Diagnosis not present

## 2019-04-04 LAB — CBC
HCT: 30 % — ABNORMAL LOW (ref 36.0–46.0)
Hemoglobin: 10.2 g/dL — ABNORMAL LOW (ref 12.0–15.0)
MCH: 32.2 pg (ref 26.0–34.0)
MCHC: 34 g/dL (ref 30.0–36.0)
MCV: 94.6 fL (ref 80.0–100.0)
Platelets: 247 10*3/uL (ref 150–400)
RBC: 3.17 MIL/uL — ABNORMAL LOW (ref 3.87–5.11)
RDW: 12.7 % (ref 11.5–15.5)
WBC: 4.5 10*3/uL (ref 4.0–10.5)
nRBC: 0 % (ref 0.0–0.2)

## 2019-04-04 LAB — COMPREHENSIVE METABOLIC PANEL
ALT: 18 U/L (ref 0–44)
AST: 32 U/L (ref 15–41)
Albumin: 2.5 g/dL — ABNORMAL LOW (ref 3.5–5.0)
Alkaline Phosphatase: 53 U/L (ref 38–126)
Anion gap: 13 (ref 5–15)
BUN: 14 mg/dL (ref 8–23)
CO2: 21 mmol/L — ABNORMAL LOW (ref 22–32)
Calcium: 8.4 mg/dL — ABNORMAL LOW (ref 8.9–10.3)
Chloride: 99 mmol/L (ref 98–111)
Creatinine, Ser: 0.67 mg/dL (ref 0.44–1.00)
GFR calc Af Amer: >60 mL/min (ref >60)
GFR calc non Af Amer: >60 mL/min (ref >60)
Glucose, Bld: 92 mg/dL (ref 70–99)
Potassium: 3.2 mmol/L — ABNORMAL LOW (ref 3.5–5.1)
Sodium: 133 mmol/L — ABNORMAL LOW (ref 135–145)
Total Bilirubin: 0.8 mg/dL (ref 0.3–1.2)
Total Protein: 4.9 g/dL — ABNORMAL LOW (ref 6.5–8.1)

## 2019-04-04 LAB — CK: Total CK: 40 U/L (ref 38–234)

## 2019-04-04 LAB — PROTIME-INR
INR: 1.2 (ref 0.8–1.2)
Prothrombin Time: 14.8 seconds (ref 11.4–15.2)

## 2019-04-04 LAB — SEDIMENTATION RATE: Sed Rate: 15 mm/hr (ref 0–22)

## 2019-04-04 LAB — LACTATE DEHYDROGENASE: LDH: 206 U/L — ABNORMAL HIGH (ref 98–192)

## 2019-04-04 LAB — TSH: TSH: 2.73 u[IU]/mL (ref 0.350–4.500)

## 2019-04-04 LAB — BRAIN NATRIURETIC PEPTIDE: B Natriuretic Peptide: 76.7 pg/mL (ref 0.0–100.0)

## 2019-04-04 LAB — URIC ACID: Uric Acid, Serum: 6.4 mg/dL (ref 2.5–7.1)

## 2019-04-04 LAB — C-REACTIVE PROTEIN: CRP: 4 mg/dL — ABNORMAL HIGH (ref <1.0)

## 2019-04-04 LAB — SARS CORONAVIRUS 2 (TAT 6-24 HRS): SARS Coronavirus 2: NEGATIVE

## 2019-04-04 MED ORDER — SODIUM CHLORIDE 0.9 % IV SOLN: 250.0000 mL | INTRAVENOUS | Status: DC | PRN

## 2019-04-04 MED ORDER — CARVEDILOL 12.5 MG PO TABS
12.5000 mg | ORAL_TABLET | Freq: Two times a day (BID) | ORAL | Status: DC
  Administered 2019-04-04 – 2019-04-07 (×6): 12.5 mg via ORAL
  Filled 2019-04-04 (×6): qty 1

## 2019-04-04 MED ORDER — ACETAMINOPHEN 325 MG PO TABS
650.0000 mg | ORAL_TABLET | Freq: Four times a day (QID) | ORAL | Status: DC | PRN
  Administered 2019-04-04 – 2019-04-06 (×3): 650 mg via ORAL
  Filled 2019-04-04 (×3): qty 2

## 2019-04-04 MED ORDER — SODIUM CHLORIDE 0.9% FLUSH: 3.0000 mL | INTRAVENOUS | Status: DC | PRN

## 2019-04-04 MED ORDER — FUROSEMIDE 10 MG/ML IJ SOLN
40.0000 mg | Freq: Once | INTRAMUSCULAR | Status: AC
  Administered 2019-04-04: 40 mg via INTRAVENOUS
  Filled 2019-04-04: qty 4

## 2019-04-04 MED ORDER — ENOXAPARIN SODIUM 40 MG/0.4ML ~~LOC~~ SOLN
40.0000 mg | SUBCUTANEOUS | Status: DC
  Administered 2019-04-04 – 2019-04-06 (×3): 40 mg via SUBCUTANEOUS
  Filled 2019-04-04 (×3): qty 0.4

## 2019-04-04 MED ORDER — HYDRALAZINE HCL 20 MG/ML IJ SOLN: 10.0000 mg | Freq: Four times a day (QID) | INTRAMUSCULAR | Status: DC | PRN

## 2019-04-04 MED ORDER — POTASSIUM CHLORIDE CRYS ER 20 MEQ PO TBCR
20.0000 meq | EXTENDED_RELEASE_TABLET | Freq: Once | ORAL | Status: AC
  Administered 2019-04-04: 20 meq via ORAL
  Filled 2019-04-04: qty 1

## 2019-04-04 MED ORDER — ONDANSETRON HCL 4 MG/2ML IJ SOLN
4.0000 mg | Freq: Four times a day (QID) | INTRAMUSCULAR | Status: DC | PRN
  Administered 2019-04-04: 4 mg via INTRAVENOUS
  Filled 2019-04-04: qty 2

## 2019-04-04 NOTE — Progress Notes (Signed)
Cardiology Office Note    Date:  04/04/2019   ID:  Joanna Ali, DOB 1951-02-22, MRN [id number]  PCP:  Octavia Bruckner, MD  Cardiologist:  Shelva Majestic, MD   F/u cardiology evaluation, initially referred through the courtesy of Leafy Kindle, [place] at [facility] rheumatology  History of Present Illness:  Joanna Ali is a 68 y.o. female who denies any awareness of prior cardiac history.  She states that up until recently he was able to walk at a good pace and was unaware of any issues.  However, over the past 2 months she has noticed progressive swelling of her lower extremity initiating at the feet and ankles and now moving up to her legs to around her knees.  At least a 20-year history of hypertension.  She also has osteoarthritic issues.  There also is a history of macular degeneration.  She ultimately underwent rheumatologic referral evaluation and was seen at [facility] rheumatology on March 16, 2019 by Leafy Kindle, [place].  The patient was concerned of possibly having toyed arthritis contributing to her issues.  However, on evaluation her blood pressure was 112/66.  He was felt to have no joint erythema edema or warmth but had osteoarthritic changes in her hands and feet.  There were no deformities.  On exam she was noted to have splinter hemorrhages of her fingernails, 3+ pitting edema to her lower extremity and symptoms suggestive of dyspnea on exertion.  She underwent a thorough laboratory evaluation and at present these results are still pending.  Upon further questioning the patient states she had noticed low-grade temperature every afternoon ranging from 99-100.9.  She denies any night sweats.  When I initially saw her, she was on Benicar 40 mg and HCTZ 25 mg.  She denied any awareness to COVID exposure.  She denied any prior awareness of cardiac history or valvular pathology.    On my initial evaluation, I was concerned for progressive lower extremity edema extending below her knee, sinus  tachycardia splinter hemorrhages and did not know her LV function.  There was concerns for possible endocarditis.  I sent off significant laboratory.  An echo Doppler done the following day with Dr. Davina Poke feels a moderate pericardial effusion doubt tamponade and at least moderate aortic insufficiency.  Her BNP was normal at 59, erythrocyte sedimentation rate 14 and CRP 38.  Thyroid studies were normal.  She subsequently underwent a TEE on March 28, 2019 which again confirmed moderate pericardial effusion with no evidence for tamponade and moderate aortic insufficiency.  Lower extremity Doppler studies were negative.  Apparently since I saw the patient she had been told hold her beta-blocker therapy due to low blood pressure and has only been taking furosemide 40 mg in addition to potassium.  Since I had initially seen her, she admits that she is significantly more short of breath she has now developed significant 3-4+ edema standing to her groin region bilaterally.  She also admits to some lower abdominal swelling.  She continues to admit to low-grade temperatures at 100 degrees every late afternoon.  She had been scheduled to see a hematologist and had a tentative appointment to be seen later this week in the office.  She presents for evaluation.  Past Medical History:  Diagnosis Date   Hypertension     Past Surgical History:  Procedure Laterality Date   NECK SURGERY     TEE WITHOUT CARDIOVERSION N/A 03/28/2019   Procedure: TRANSESOPHAGEAL ECHOCARDIOGRAM (TEE);  Surgeon: Geralynn Rile, MD;  Location: [facility]  ENDOSCOPY;  Service: Cardiology;  Laterality: N/A;    Current Medications: Outpatient Medications Prior to Visit  Medication Sig Dispense Refill   acyclovir (ZOVIRAX) 400 MG tablet Take 400 mg by mouth 2 (two) times daily as needed (may take for 2 days if needed for fever blisters).      furosemide (LASIX) 40 MG tablet Take 1 tablet (40 mg total) by mouth daily. 90 tablet 3    Multiple Vitamins-Minerals (PRESERVISION AREDS 2) CAPS Take 1 capsule by mouth 2 (two) times daily.      potassium chloride SA (K-DUR) 20 MEQ tablet Take 1 tablet (20 mEq total) by mouth daily. 90 tablet 3   TURMERIC PO Take 1 capsule by mouth 2 (two) times daily.     No facility-administered medications prior to visit.      Allergies:   Patient has no known allergies.   Social History   Socioeconomic History   Marital status: Married    Spouse name: Not on file   Number of children: Not on file   Years of education: Not on file   Highest education level: Not on file  Occupational History   Not on file  Social Needs   Financial resource strain: Not on file   Food insecurity    Worry: Not on file    Inability: Not on file   Transportation needs    Medical: Not on file    Non-medical: Not on file  Tobacco Use   Smoking status: Former Smoker    Types: Cigarettes    Quit date: 03/20/1979    Years since quitting: 40.0   Smokeless tobacco: Never Used   Tobacco comment: pt states she smoked from her early 21s to early 55s.  Substance and Sexual Activity   Alcohol use: No   Drug use: No   Sexual activity: Not on file  Lifestyle   Physical activity    Days per week: Not on file    Minutes per session: Not on file   Stress: Not on file  Relationships   Social connections    Talks on phone: Not on file    Gets together: Not on file    Attends religious service: Not on file    Active member of club or organization: Not on file    Attends meetings of clubs or organizations: Not on file    Relationship status: Not on file  Other Topics Concern   Not on file  Social History Narrative   Not on file    Additional social history is notable that she was born in [place].  She is a retired Optometrist.  She is married for 48 years has 1 daughter and 3 grandchildren.  She remotely smoked for short duration for 10 years and quit 40 years ago.  She does drink  occasional wine and cocktails.  She enjoys exercise including playing golf and water aerobics.  Family History: Her mother died at age 102 and had esophageal cancer.  She did not know her father but he is deceased.  A maternal grandmother mother had suffered a heart attack a paternal grandmother had lung cancer.  1 brother age 68 is status post CABG surgery x3.  Another sister age 49 is alive and well.  She has 1 daughter age 44  ROS General: Negative; No fevers, chills, or night sweats;  HEENT: Negative; No changes in vision or hearing, sinus congestion, difficulty swallowing Pulmonary: Negative; No cough, wheezing, shortness of breath, hemoptysis Cardiovascular:  Negative; No chest pain, presyncope, syncope, palpitations GI: Negative; No nausea, vomiting, diarrhea, or abdominal pain GU: Negative; No dysuria, hematuria, or difficulty voiding Musculoskeletal: Negative; no myalgias, joint pain, or weakness Hematologic/Oncology: Negative; no easy bruising, bleeding Endocrine: Negative; no heat/cold intolerance; no diabetes Neuro: Negative; no changes in balance, headaches Skin: Negative; No rashes or skin lesions Psychiatric: Negative; No behavioral problems, depression Sleep: Negative; No snoring, daytime sleepiness, hypersomnolence, bruxism, restless legs, hypnogognic hallucinations, no cataplexy Other comprehensive 14 point system review is negative.   PHYSICAL EXAM:   VS:  BP 124/80    Pulse (!) 111    Ht 5' 3"  (1.6 m)    Wt 142 lb (64.4 kg)    BMI 25.15 kg/m     Repeat blood pressure by me was 148/80 without significant paradox.  She was tachycardic with pulse at 111  Wt Readings from Last 3 Encounters:  04/04/19 142 lb (64.4 kg)  03/23/19 138 lb 9.6 oz (62.9 kg)  03/20/19 140 lb (63.5 kg)    General: Alert, oriented, fatigued with shortness of breath Skin: normal turgor, no rashes, warm and dry HEENT: Normocephalic, atraumatic. Pupils equal round and reactive to light; sclera  anicteric; extraocular muscles intact; no evidence for conjunctival petechiae; fundi without hemorrhages or exudates Nose without nasal septal hypertrophy Mouth/Parynx benign; Mallinpatti scale 3 previously noted Neck: No JVD, no carotid bruits; normal carotid upstroke Lungs: No wheezing, slightly decreased at the bases Chest wall: without tenderness to palpitation Heart: PMI not displaced, tachycardic at heart rate 111, s1 s2 normal, 1/6 systolic BTDVVO,1/6 diastolic murmur, no rubs, gallops, thrills, or heaves Abdomen: soft, nontender; no hepatosplenomehaly, BS+; abdominal aorta nontender and not dilated by palpation. Back: no CVA tenderness Pulses 2+ Musculoskeletal: full range of motion, normal strength, no joint deformities Extremities: 3-4+ aggressive lower extremity edema bilaterally now extending superiorly and involving her thighs to the groin region  splinter hemorrhages on several nailbeds;  no clubbing cyanosis or edema, Homan's sign negative  Neurologic: grossly nonfocal; Cranial nerves grossly wnl Psychologic: Very concerned about her progressive symptomatology and is very agreeable to hospitalization from the office today.   Studies/Labs Reviewed:   EKG:  EKG is ordered today.  ECG (independently read by me): Sinus tachycardia at 111; PR interval 116 ms, QTc interval 465 ms; low voltage  March 21, 2019 ECG (independently read by me): Sinus tachycardia at 104 bpm.  PR interval 128 ms.  Poor anterior R wave progression.  QTc interval 452 ms.  Nonspecific ST changes.  Recent Labs: BMP Latest Ref Rng & Units 03/23/2019 03/20/2019  Glucose 65 - 99 mg/dL 90 98  BUN 8 - 27 mg/dL 23 22  Creatinine 0.57 - 1.00 mg/dL 0.81 0.78  BUN/Creat Ratio 12 - 28 28 28   Sodium 134 - 144 mmol/L 134 133(L)  Potassium 3.5 - 5.2 mmol/L 3.5 3.1(L)  Chloride 96 - 106 mmol/L 95(L) 94(L)  CO2 20 - 29 mmol/L 21 22  Calcium 8.7 - 10.3 mg/dL 8.7 8.7     Hepatic Function Latest Ref Rng & Units  03/20/2019  Total Protein 6.0 - 8.5 g/dL 5.2(L)  Albumin 3.8 - 4.8 g/dL 3.2(L)  AST 0 - 40 IU/L 31  ALT 0 - 32 IU/L 16  Alk Phosphatase 39 - 117 IU/L 71  Total Bilirubin 0.0 - 1.2 mg/dL 0.3    CBC Latest Ref Rng & Units 03/23/2019 03/20/2019  WBC 3.4 - 10.8 x10E3/uL 5.9 5.1  Hemoglobin 11.1 - 15.9 g/dL 11.2 11.1  Hematocrit 34.0 - 46.6 % 33.3(L) 31.3(L)  Platelets 150 - 450 x10E3/uL 243 -   Lab Results  Component Value Date   MCV 93 03/23/2019   MCV 91 03/20/2019   No results found for: TSH No results found for: HGBA1C   BNP    Component Value Date/Time   BNP 44.1 03/20/2019 1647    ProBNP No results found for: PROBNP   Lipid Panel  No results found for: CHOL, TRIG, HDL, CHOLHDL, VLDL, LDLCALC, LDLDIRECT   RADIOLOGY: Vas [place] Abi With/wo Tbi  Result Date: 03/24/2019 LOWER EXTREMITY DOPPLER STUDY Indications: Patient reports having blue toes and fingers along with bilateral              lower extremity and foot edema x 2 months. Denies claudication              symptoms but does have aches and pains and cramps in her legs at              nighttime. Poor pulses noted on Physician exam with at least 2+              pitting edema. High Risk Factors: Hypertension, past history of smoking.  Performing Technologist: Mariane Masters RVT  Examination Guidelines: A complete evaluation includes at minimum, Doppler waveform signals and systolic blood pressure reading at the level of bilateral brachial, anterior tibial, and posterior tibial arteries, when vessel segments are accessible. Bilateral testing is considered an integral part of a complete examination. Photoelectric Plethysmograph (PPG) waveforms and toe systolic pressure readings are included as required and additional duplex testing as needed. Limited examinations for reoccurring indications may be performed as noted.  ABI Findings: +---------+------------------+-----+---------+--------+  Right     Rt Pressure (mmHg) Index Waveform   Comment   +---------+------------------+-----+---------+--------+  Brachial  103                                          +---------+------------------+-----+---------+--------+  ATA       109                1.06  triphasic           +---------+------------------+-----+---------+--------+  PTA       139                1.35  triphasic           +---------+------------------+-----+---------+--------+  PERO      127                1.23  biphasic            +---------+------------------+-----+---------+--------+  Great Toe 80                 0.78                      +---------+------------------+-----+---------+--------+ +---------+------------------+-----+---------+-------+  Left      Lt Pressure (mmHg) Index Waveform  Comment  +---------+------------------+-----+---------+-------+  Brachial  96                                          +---------+------------------+-----+---------+-------+  ATA       122                1.18  triphasic          +---------+------------------+-----+---------+-------+  PTA       133                1.29  triphasic          +---------+------------------+-----+---------+-------+  PERO      121                1.17  biphasic           +---------+------------------+-----+---------+-------+  Great Toe 59                 0.57                     +---------+------------------+-----+---------+-------+ +-------+-----------+-----------+------------+------------+  ABI/TBI Today's ABI Today's TBI Previous ABI Previous TBI  +-------+-----------+-----------+------------+------------+  Right   1.3         0.78                                   +-------+-----------+-----------+------------+------------+  Left    1.29        0.57                                   +-------+-----------+-----------+------------+------------+ OES Findings:  Digital PPG tracings are somewhat diminished with borderline pulsatile flow.    Summary: Right: Resting right ankle-brachial index is within normal range. No evidence of  significant right lower extremity arterial disease. The right toe-brachial index is normal. Left: Resting left ankle-brachial index is within normal range. No evidence of significant left lower extremity arterial disease. The left toe-brachial index is abnormal.  *See table(s) above for measurements and observations. See arterial duplex and aorta iliac duplex reports.  Electronically signed by Larae Grooms MD on 03/24/2019 at 5:29:26 PM.    Final    Vas [place] Lower Extremity Arterial Duplex  Result Date: 03/24/2019 LOWER EXTREMITY ARTERIAL DUPLEX STUDY Indications: Patient reports having blue toes and fingers along with bilateral              lower extremity and foot edema x 2 months. Denies claudication              symptoms but does have aches and pains and cramps in her legs at              nighttime. Poor pulses noted on Physician exam with at least 2 +              pitting edema. Evaluate for source of possible emboli. High Risk Factors: Hypertension, past history of smoking.  Current ABI: Today's ABIs are 1.3 on the right and 1.29 on the left. Performing Technologist: Mariane Masters RVT  Examination Guidelines: A complete evaluation includes B-mode imaging, spectral Doppler, color Doppler, and power Doppler as needed of all accessible portions of each vessel. Bilateral testing is considered an integral part of a complete examination. Limited examinations for reoccurring indications may be performed as noted.  +-----------+--------+-----+--------+---------+--------+  RIGHT       PSV cm/s Ratio Stenosis Waveform  Comments  +-----------+--------+-----+--------+---------+--------+  CFA Prox    106                     triphasic           +-----------+--------+-----+--------+---------+--------+  DFA  52                      triphasic           +-----------+--------+-----+--------+---------+--------+  SFA Prox    69                      triphasic            +-----------+--------+-----+--------+---------+--------+  SFA Mid     74                      triphasic           +-----------+--------+-----+--------+---------+--------+  SFA Distal  87                      triphasic           +-----------+--------+-----+--------+---------+--------+  POP Prox    69                      triphasic           +-----------+--------+-----+--------+---------+--------+  POP Distal  71                      triphasic           +-----------+--------+-----+--------+---------+--------+  TP Trunk    67                      triphasic           +-----------+--------+-----+--------+---------+--------+  ATA Distal  61                      triphasic           +-----------+--------+-----+--------+---------+--------+  PTA Distal  75                      triphasic           +-----------+--------+-----+--------+---------+--------+  PERO Distal 50                      triphasic           +-----------+--------+-----+--------+---------+--------+  +-----------+--------+-----+--------+---------+--------+  LEFT        PSV cm/s Ratio Stenosis Waveform  Comments  +-----------+--------+-----+--------+---------+--------+  CFA Prox    108                     triphasic           +-----------+--------+-----+--------+---------+--------+  DFA         59                      triphasic           +-----------+--------+-----+--------+---------+--------+  SFA Prox    90                      triphasic           +-----------+--------+-----+--------+---------+--------+  SFA Mid     88                      triphasic           +-----------+--------+-----+--------+---------+--------+  SFA Distal  106                     triphasic           +-----------+--------+-----+--------+---------+--------+  POP Prox    71                      triphasic           +-----------+--------+-----+--------+---------+--------+  POP Distal  56                      triphasic           +-----------+--------+-----+--------+---------+--------+  TP Trunk     71                      triphasic           +-----------+--------+-----+--------+---------+--------+  ATA Distal  54                      triphasic           +-----------+--------+-----+--------+---------+--------+  PTA Distal  78                      triphasic           +-----------+--------+-----+--------+---------+--------+  PERO Distal 45                      triphasic           +-----------+--------+-----+--------+---------+--------+  Summary: Right: Normal examination. No evidence of arterial occlusive disease. Left: Normal examination. No evidence of arterial occlusive disease.  See table(s) above for measurements and observations. See ABI and aorta-iliac duplex reports. Electronically signed by Larae Grooms MD on 03/24/2019 at 5:35:23 PM.    Final    Vas US Aorta/ivc/iliacs  Result Date: 03/24/2019 ABDOMINAL AORTA STUDY Indications: Patient reports having blue toes and fingers along with bilateral              lower extremity and foot edema x 2 months. Denies claudication              symptoms but does have aches and pains and cramps in her legs at              nighttime. Poor pulses noted on Physician exam with at least 2 +              pitting edema. Evaluate for source of possible emboli. Risk Factors: Hypertension, past history of smoking. Limitations: Air/bowel gas.  Performing Technologist: Mariane Masters RVT  Examination Guidelines: A complete evaluation includes B-mode imaging, spectral Doppler, color Doppler, and power Doppler as needed of all accessible portions of each vessel. Bilateral testing is considered an integral part of a complete examination. Limited examinations for reoccurring indications may be performed as noted.  Abdominal Aorta Findings: +-------------+-------+----------+----------+---------+--------+--------+  Location      AP (cm) Trans (cm) PSV (cm/s) Waveform  Thrombus Comments  +-------------+-------+----------+----------+---------+--------+--------+  Proximal       2.50    2.50       82         biphasic                     +-------------+-------+----------+----------+---------+--------+--------+  Mid           1.80    1.80       52         triphasic                    +-------------+-------+----------+----------+---------+--------+--------+  Distal        1.60  1.60       56         triphasic                    +-------------+-------+----------+----------+---------+--------+--------+  RT CIA Prox   1.5     1.5        68         triphasic                    +-------------+-------+----------+----------+---------+--------+--------+  RT CIA Mid                       58         triphasic                    +-------------+-------+----------+----------+---------+--------+--------+  RT CIA Distal                    56         biphasic                     +-------------+-------+----------+----------+---------+--------+--------+  RT EIA Prox                      144        triphasic                    +-------------+-------+----------+----------+---------+--------+--------+  RT EIA Mid                       83         triphasic                    +-------------+-------+----------+----------+---------+--------+--------+  RT EIA Distal                    109        triphasic                    +-------------+-------+----------+----------+---------+--------+--------+  LT CIA Prox   1.1     1.2        91         triphasic                    +-------------+-------+----------+----------+---------+--------+--------+  LT CIA Mid                       81         biphasic                     +-------------+-------+----------+----------+---------+--------+--------+  LT CIA Distal                    138        triphasic                    +-------------+-------+----------+----------+---------+--------+--------+  LT EIA Prox                      92         triphasic                    +-------------+-------+----------+----------+---------+--------+--------+  LT EIA Mid                       144  triphasic                    +-------------+-------+----------+----------+---------+--------+--------+  LT EIA Distal                    93         triphasic                    +-------------+-------+----------+----------+---------+--------+--------+ IVC/Iliac Findings: +--------+------+--------+--------+    IVC    Patent Thrombus Comments  +--------+------+--------+--------+  IVC Prox patent                    +--------+------+--------+--------+   Summary: Abdominal Aorta: No evidence of an abdominal aortic aneurysm was visualized. The largest aortic measurement is 2.5 cm. The right common iliac artery is mildly ectatic measuring 1.5 cm. Stenosis: No evidence of stenosis in the aorta or bilateral iliac arteries.  *See table(s) above for measurements and observations. See ABI and arterial duplex reports.  Electronically signed by Larae Grooms MD on 03/24/2019 at 5:33:51 PM.    Final      Additional studies/ records that were reviewed today include:  Reviewed the rheumatology evaluation of March 16, 2019.  We tried calling the office today to obtain results of the laboratory but this was still not available.  ASSESSMENT:    Will admit patient to [facility] to see progressive unit.  PLAN:  Ms. Joanna Ali is a 68 year old female who has a 20-year history of hypertension, history of osteoarthritis, and since July has developed progressive lower extremity edema now has significantly increased since my initial evaluation of March 21, 2019.  She now has 3-4+ lower extremity edema up to her proximal thigh.  She also started to notice some bloating sensation in her lower abdomen.  I discussed the scenario with Dr. Grayling Congress who had seen the patient after my initial assessment during my absence.  In light of the patient's progressive symptomatology I have recommended hospitalization today.  Dr. Davina Poke will formally admit the patient since he is a rounder at the hospital.  She will need  initiation of IV diuresis, with possible metolazone, low-dose beta-blocker therapy, and should consider CT imaging of her chest and abdomen to make certain there is no obstructive etiology to her progressive lower extremity swelling.  Her TEE and negative blood cultures argues against subacute endocarditis.  She was noted to have a mild M spike on her valuation and will benefit from hematologic/oncologic evaluation.  She has been documented to have a moderate pericardial effusion without tamponade hemodynamics.  Her blood pressure in the office by me was elevated at 148/80 without significant paradox.   Medication Adjustments/Labs and Tests Ordered: Current medicines are reviewed at length with the patient today.  Concerns regarding medicines are outlined above.  Medication changes, Labs and Tests ordered today are listed in the Patient Instructions below. There are no Patient Instructions on file for this visit.   Signed, Shelva Majestic, MD  04/04/2019 10:49 AM    [facility] [address], [place], [place], [place]  [address] Phone: [phone]

## 2019-04-04 NOTE — H&P (Addendum)
The patient has been seen in conjunction with Daune Perch, NP. All aspects of care have been considered and discussed. The patient has been personally interviewed, examined, and all clinical data has been reviewed.   Very tender lower extremities with edema from dorsum of the feet to the thighs.  Neck veins are flat.  There is no pulmonary congestion on exam.  Etiology of lower extremity tenderness is uncertain.  Exam does not support right heart failure/pulmonary hypertension.  Consider an inflammatory myopathy versus hypothyroidism versus inferior vena caval obstruction.  1 dose of IV Lasix this evening.  Potassium is being repleted.  Blood pressure will be watched closely.  A CPK and TSH will be ordered.  Repeat be met in a.m.  The patient is scheduled to have abdominal CT scan performed tomorrow.    Cardiology Admission History and Physical:   Patient ID: Joanna Ali MRN: [id number]; DOB: September 03, 1950   Admission date: 04/04/2019  Primary Care Provider: Octavia Bruckner, MD Primary Cardiologist: Shelva Majestic, MD  Primary Electrophysiologist:  None   Chief Complaint:  Edema  Patient Profile:   Joanna Ali is a 68 y.o. female with hypertension, osteoarthritis and macular degeneration and anasarca. No known prior cardiac history. She has been evaluated for volume overload with normal LV function on echo. BNP normal. No cardiac cause of her volume overload has been found. Pt is being admitted for volume management and further evaluation.   History of Present Illness:   Joanna Ali has been having progressively worsening lower extremity edema extending up to her groins. She has also had splinter hemorrhages and blue toes.  There was concerns for possible endocarditis.  She underwent TEE on 03/28/2019 that showed no evidence of infective endocarditis.  She did have moderate aortic insufficiency that will need periodic surveillance.  She had moderate pericardial effusion with no evidence of  tamponade.  EF was normal at 60-65%.  Dr. Claiborne Billings ordered a panel of labs.  Her BNP was normal at 59, erythrocyte sedimentation rate 14 and CRP 38.  Thyroid studies were normal.  Lower extremity Doppler studies were negative.  Apparently the patient said she had been told hold her beta-blocker therapy due to low blood pressure and has only been taking furosemide 40 mg in addition to potassium.    The patient was seen in the office today by Dr. Claiborne Billings and she admited that she is significantly more short of breath. She has now developed significant 3-4+ edema standing to her groin region bilaterally.  She also admited to some lower abdominal swelling.  She continued to admit to low-grade temperatures at 100 degrees every late afternoon.  She had been scheduled to see a hematologist and had a tentative appointment to be seen later this week in the office.    Direct admission to the hospital was arranged due to her progressive symptomatology with plan for IV diuresis, possible metolazone, low-dose beta-blocker therapy and consider CT scan imaging of the chest and abdomen to make certain that there is no obstructive to her progressive lower extremity swelling.  She was noted to have a mild M spike on her evaluation and will benefit from hematology/oncology evaluation.  Her blood pressure in the office was elevated 140/80 without significant paradox.    Heart Pathway Score:     Past Medical History:  Diagnosis Date  . Hypertension     Past Surgical History:  Procedure Laterality Date  . NECK SURGERY    . TEE WITHOUT CARDIOVERSION N/A 03/28/2019  Procedure: TRANSESOPHAGEAL ECHOCARDIOGRAM (TEE);  Surgeon: Geralynn Rile, MD;  Location: [facility];  Service: Cardiology;  Laterality: N/A;     Medications Prior to Admission: Prior to Admission medications   Medication Sig Start Date End Date Taking? Authorizing Provider  acyclovir (ZOVIRAX) 400 MG tablet Take 400 mg by mouth 2 (two) times daily  as needed (may take for 2 days if needed for fever blisters).  07/03/13   [provider]  furosemide (LASIX) 40 MG tablet Take 1 tablet (40 mg total) by mouth daily. 03/20/19 06/18/19  Troy Sine, MD  Multiple Vitamins-Minerals (PRESERVISION AREDS 2) CAPS Take 1 capsule by mouth 2 (two) times daily.     [provider]  potassium chloride SA (K-DUR) 20 MEQ tablet Take 1 tablet (20 mEq total) by mouth daily. 03/21/19   Troy Sine, MD  TURMERIC PO Take 1 capsule by mouth 2 (two) times daily.    [provider]     Allergies:   No Known Allergies  Social History:   Social History   Socioeconomic History  . Marital status: Married    Spouse name: Not on file  . Number of children: Not on file  . Years of education: Not on file  . Highest education level: Not on file  Occupational History  . Not on file  Social Needs  . Financial resource strain: Not on file  . Food insecurity    Worry: Not on file    Inability: Not on file  . Transportation needs    Medical: Not on file    Non-medical: Not on file  Tobacco Use  . Smoking status: Former Smoker    Types: Cigarettes    Quit date: 03/20/1979    Years since quitting: 40.0  . Smokeless tobacco: Never Used  . Tobacco comment: pt states she smoked from her early 76s to early 81s.  Substance and Sexual Activity  . Alcohol use: No  . Drug use: No  . Sexual activity: Not on file  Lifestyle  . Physical activity    Days per week: Not on file    Minutes per session: Not on file  . Stress: Not on file  Relationships  . Social Herbalist on phone: Not on file    Gets together: Not on file    Attends religious service: Not on file    Active member of club or organization: Not on file    Attends meetings of clubs or organizations: Not on file    Relationship status: Not on file  . Intimate partner violence    Fear of current or ex partner: Not on file    Emotionally abused: Not on file     Physically abused: Not on file    Forced sexual activity: Not on file  Other Topics Concern  . Not on file  Social History Narrative  . Not on file    Family History:   The patient's family history includes Hypertension in her mother.    ROS:  Please see the history of present illness.  All other ROS reviewed and negative.     Physical Exam/Data:   Vitals:   04/04/19 1750  BP: (!) 136/91  Pulse: (!) 102  Temp: 98.6 F (37 C)  TempSrc: Oral  SpO2: 98%  Weight: 65.1 kg   No intake or output data in the 24 hours ending 04/04/19 1759 Last 3 Weights 04/04/2019 04/04/2019 03/23/2019  Weight (lbs) 143 lb  8 oz 142 lb 138 lb 9.6 oz  Weight (kg) 65.091 kg 64.411 kg 62.869 kg     Body mass index is 25.42 kg/m.  General:  Well nourished, well developed, in no acute distress HEENT: normal Lymph: no adenopathy Neck: no JVD Endocrine:  No thryomegaly Vascular: No carotid bruits; FA pulses 2+ bilaterally without bruits  Cardiac:  normal S1, S2; RRR; no murmur  Lungs:  clear to auscultation bilaterally, no wheezing, rhonchi or rales  Abd: soft, nontender, no hepatomegaly  Ext: 3-4+ edema up to thighs. Warm, erythematous and tender legs.  Musculoskeletal:  No deformities, BUE and BLE strength normal and equal Skin: warm and dry  Neuro:  CNs 2-12 intact, no focal abnormalities noted Psych:  Normal affect    EKG:  The ECG that was done on 03/23/19  was personally reviewed and demonstrates NSR with low voltage, and non-specific T changes  Relevant CV Studies:  TEE 03/28/2019 IMPRESSIONS   1. No evidence of infective endocarditis. Moderate AI that will need periodic surveillance.  2. A moderate degree of aortic regurgitation is present. The valve is thickened and the jet is centrally directed. The 2D vena contracta is 0.34 cm. 3D ERO computed to 0.17 cm2. Overall, solidly moderate AI.  3. The right ventricle has normal systolc function. The cavity was normal. There is no increase in  right ventricular wall thickness.  4. Moderate pericardial effusion.  5. No evidence of tamponade.  6. The pericardial effusion is circumferential.  7. The mitral valve is grossly normal. Mild thickening of the mitral valve leaflet.  8. The tricuspid valve was grossly normal.  9. The aortic valve is tricuspid Mild thickening of the aortic valve. Aortic valve regurgitation is moderate by color flow Doppler. No stenosis of the aortic valve. 10. The aortic root, ascending aorta, aortic arch and descending aorta are normal in size and structure. 11. The left ventricle has normal systolic function, with an ejection fraction of 60-65%.  Echocardiogram 03/22/2019 IMPRESSIONS   1. There is a moderate pericardial effusion present that is circumferential. There is no evidence of RV/RA diastolic collapse to suggest overt tamponade. The IVC is collapsable. There is mild collapse of the RA in systole which is a non-specific  finding. There pericardial fluid appears to have a fibrous stranding, which could represent a more chronic effusion. Interrogation of the Aorta shows no obvious dissection.  2. The pericardial effusion is circumferential.  3. The left ventricle has hyperdynamic systolic function, with an ejection fraction of >65%. The cavity size was normal. Left ventricular diastolic parameters were normal.  4. Moderate pericardial effusion.  5. The mitral valve is grossly normal.  6. The tricuspid valve is grossly normal.  7. The aortic valve is tricuspid. Mild thickening of the aortic valve. Aortic valve regurgitation is moderate to severe by color flow Doppler. No stenosis of the aortic valve.  8. The aorta is normal unless otherwise noted.  9. The aortic root, ascending aorta and Asc Ao measures 3.4 cm are normal in size and structure. 10. When compared to the prior study: No comparison.    Laboratory Data:  High Sensitivity Troponin:  No results for input(s): TROPONINIHS in the last 720  hours.    ChemistryNo results for input(s): NA, K, CL, CO2, GLUCOSE, BUN, CREATININE, CALCIUM, GFRNONAA, GFRAA, ANIONGAP in the last 168 hours.  No results for input(s): PROT, ALBUMIN, AST, ALT, ALKPHOS, BILITOT in the last 168 hours. HematologyNo results for input(s): WBC, RBC, HGB, HCT, MCV,  MCH, MCHC, RDW, PLT in the last 168 hours. BNPNo results for input(s): BNP, PROBNP in the last 168 hours.  DDimer No results for input(s): DDIMER in the last 168 hours.   Radiology/Studies:  No results found.  Assessment and Plan:   Lower extremity edema -Patient has had progressively worsening lower extremity edema since July.  Patient is also started to notice a fluttering sensation in her abdomen. -BNP was normal.  -Her TEE and negative blood cultures argues against subacute endocarditis.  -She was noted to have a mild M spike on her valuation and will benefit from hematologic/oncologic evaluation.  She has been documented to have a moderate pericardial effusion without tamponade hemodynamics.  Her blood pressure in the office by me was elevated at 148/80 without significant paradox.  -We will check labs including CK, thyroid function, CBC and complete metabolic panel. -Will give lasix 40 mg IV tonight.  Will give potassium along with Lasix. -Dr. Audie Box will see pt in am. Per his request I will order a CT abdomen/pelvis and CT chest for tomorrow morning. He plans to consult oncology tomorrow.   Hypertension -Pt is only on lasix at home.  -Will monitor.   Severity of Illness: The appropriate patient status for this patient is INPATIENT. Inpatient status is judged to be reasonable and necessary in order to provide the required intensity of service to ensure the patient's safety. The patient's presenting symptoms, physical exam findings, and initial radiographic and laboratory data in the context of their chronic comorbidities is felt to place them at high risk for further clinical deterioration.  Furthermore, it is not anticipated that the patient will be medically stable for discharge from the hospital within 2 midnights of admission. The following factors support the patient status of inpatient.   " The patient's presenting symptoms include swollen, tender legs. " The worrisome physical exam findings include anasarca. " The initial radiographic and laboratory data are worrisome because of no significant findings to explain her anasarca. " The chronic co-morbidities include hypertension.   * I certify that at the point of admission it is my clinical judgment that the patient will require inpatient hospital care spanning beyond 2 midnights from the point of admission due to high intensity of service, high risk for further deterioration and high frequency of surveillance required.*    For questions or updates, please contact [facility] Please consult [email or web address] for contact info under        Signed, Daune Perch, NP  04/04/2019 5:59 PM

## 2019-04-04 NOTE — Patient Instructions (Signed)
Direct admit for 3-4+ edema and SOB w/Dr Audie Box

## 2019-04-05 ENCOUNTER — Telehealth: Payer: Self-pay | Admitting: Adult Health

## 2019-04-05 ENCOUNTER — Inpatient Hospital Stay (HOSPITAL_COMMUNITY): Payer: Medicare Other

## 2019-04-05 ENCOUNTER — Telehealth: Payer: Self-pay | Admitting: Hematology and Oncology

## 2019-04-05 DIAGNOSIS — R609 Edema, unspecified: Secondary | ICD-10-CM

## 2019-04-05 DIAGNOSIS — I340 Nonrheumatic mitral (valve) insufficiency: Secondary | ICD-10-CM

## 2019-04-05 DIAGNOSIS — R531 Weakness: Secondary | ICD-10-CM

## 2019-04-05 DIAGNOSIS — G5712 Meralgia paresthetica, left lower limb: Secondary | ICD-10-CM

## 2019-04-05 DIAGNOSIS — I361 Nonrheumatic tricuspid (valve) insufficiency: Secondary | ICD-10-CM

## 2019-04-05 LAB — PROTEIN / CREATININE RATIO, URINE
Creatinine, Urine: 54.45 mg/dL
Protein Creatinine Ratio: 0.13 mg/mg{Cre} (ref 0.00–0.15)
Total Protein, Urine: 7 mg/dL

## 2019-04-05 LAB — URINALYSIS, COMPLETE (UACMP) WITH MICROSCOPIC
Bacteria, UA: NONE SEEN
Bilirubin Urine: NEGATIVE
Glucose, UA: NEGATIVE mg/dL
Hgb urine dipstick: NEGATIVE
Ketones, ur: NEGATIVE mg/dL
Leukocytes,Ua: NEGATIVE
Nitrite: NEGATIVE
Protein, ur: NEGATIVE mg/dL
Specific Gravity, Urine: 1.04 — ABNORMAL HIGH (ref 1.005–1.030)
pH: 6 (ref 5.0–8.0)

## 2019-04-05 LAB — BASIC METABOLIC PANEL
Anion gap: 10 (ref 5–15)
BUN: 14 mg/dL (ref 8–23)
CO2: 23 mmol/L (ref 22–32)
Calcium: 8.2 mg/dL — ABNORMAL LOW (ref 8.9–10.3)
Chloride: 101 mmol/L (ref 98–111)
Creatinine, Ser: 0.81 mg/dL (ref 0.44–1.00)
GFR calc Af Amer: >60 mL/min (ref >60)
GFR calc non Af Amer: >60 mL/min (ref >60)
Glucose, Bld: 100 mg/dL — ABNORMAL HIGH (ref 70–99)
Potassium: 3.5 mmol/L (ref 3.5–5.1)
Sodium: 134 mmol/L — ABNORMAL LOW (ref 135–145)

## 2019-04-05 LAB — HIV ANTIBODY (ROUTINE TESTING W REFLEX): HIV Screen 4th Generation wRfx: NONREACTIVE

## 2019-04-05 LAB — FERRITIN: Ferritin: 379 ng/mL — ABNORMAL HIGH (ref 11–307)

## 2019-04-05 MED ORDER — POTASSIUM CHLORIDE CRYS ER 20 MEQ PO TBCR
40.0000 meq | EXTENDED_RELEASE_TABLET | ORAL | Status: AC
  Administered 2019-04-05 (×2): 40 meq via ORAL
  Filled 2019-04-05 (×2): qty 2

## 2019-04-05 MED ORDER — FUROSEMIDE 10 MG/ML IJ SOLN
60.0000 mg | Freq: Two times a day (BID) | INTRAMUSCULAR | Status: DC
  Administered 2019-04-05 (×2): 60 mg via INTRAVENOUS
  Filled 2019-04-05 (×2): qty 6

## 2019-04-05 MED ORDER — IOHEXOL 300 MG/ML  SOLN
100.0000 mL | Freq: Once | INTRAMUSCULAR | Status: AC | PRN
  Administered 2019-04-05: 100 mL via INTRAVENOUS

## 2019-04-05 NOTE — Telephone Encounter (Signed)
Returned patient's phone call regarding 09/17 appointment, patient is currently hospitalized and will not be able to make 09/17 appointment so it is cancelled. Left a voicemail, patient will call back when they're ready to reschedule.

## 2019-04-05 NOTE — Progress Notes (Signed)
Cardiology Progress Note  Patient ID: Joanna Ali MRN: [id number] DOB: 10/11/1950 Date of Encounter: 04/05/2019  Primary Cardiologist: Joanna Majestic, MD  Subjective  Admitted from clinic yesterday with worsening anasarca.  She also endorses weakness in her lower extremities that is progressing to the proximal lower extremities.  She is extremely tender to touch on examination.  Good urine output with IV Lasix last night.  Swelling much improved from when I saw her for her transesophageal echocardiogram.  ROS:  All other ROS reviewed and negative. Pertinent positives noted in the HPI.     Inpatient Medications  Scheduled Meds: . carvedilol  12.5 mg Oral BID WC  . enoxaparin (LOVENOX) injection  40 mg Subcutaneous Q24H  . furosemide  60 mg Intravenous Q12H  . potassium chloride  40 mEq Oral Q4H   Continuous Infusions: . sodium chloride     PRN Meds: sodium chloride, acetaminophen, hydrALAZINE, ondansetron (ZOFRAN) IV, sodium chloride flush   Vital Signs   Vitals:   04/04/19 2058 04/05/19 0347 04/05/19 0800 04/05/19 0833  BP: 123/86 99/66 116/79 116/79  Pulse: (!) 106 85 92 92  Temp: 98.8 F (37.1 C)     TempSrc: Oral     SpO2: 99% 97%    Weight:        Intake/Output Summary (Last 24 hours) at 04/05/2019 0856 Last data filed at 04/04/2019 2222 Gross per 24 hour  Intake -  Output 600 ml  Net -600 ml   Last 3 Weights 04/04/2019 04/04/2019 03/23/2019  Weight (lbs) 143 lb 8 oz 142 lb 138 lb 9.6 oz  Weight (kg) 65.091 kg 64.411 kg 62.869 kg      Telemetry  Overnight telemetry shows normal sinus rhythm with rates in the 80-90 range, which I personally reviewed.   ECG  The most recent ECG shows normal sinus rhythm, with low voltage, left axis deviation, EKG dated 03/23/2019, which I personally reviewed.   Physical Exam   Vitals:   04/04/19 2058 04/05/19 0347 04/05/19 0800 04/05/19 0833  BP: 123/86 99/66 116/79 116/79  Pulse: (!) 106 85 92 92  Temp: 98.8 F (37.1 C)      TempSrc: Oral     SpO2: 99% 97%    Weight:         Intake/Output Summary (Last 24 hours) at 04/05/2019 0856 Last data filed at 04/04/2019 2222 Gross per 24 hour  Intake -  Output 600 ml  Net -600 ml    Last 3 Weights 04/04/2019 04/04/2019 03/23/2019  Weight (lbs) 143 lb 8 oz 142 lb 138 lb 9.6 oz  Weight (kg) 65.091 kg 64.411 kg 62.869 kg    Body mass index is 25.42 kg/m.  General: Well nourished, well developed, in no acute distress Heart: Atraumatic, normal size  Eyes: PEERLA, EOMI  Neck: Supple, no JVD Endocrine: No thryomegaly Cardiac: Normal S1, S2; RRR; no murmurs, rubs, or gallops Lungs: Clear to auscultation bilaterally, no wheezing, rhonchi or rales  Abd: Soft, nontender, no hepatomegaly  Ext: 2+ pitting edema to the thighs noted on examination, her legs appear warm and erythematous Musculoskeletal: No deformities Skin: Warm erythematous skin noted Neuro: Alert and oriented to person, place, time, and situation, CNII-XII grossly intact, no focal deficits  Psych: Normal mood and affect   Labs  High Sensitivity Troponin:  No results for input(s): TROPONINIHS in the last 720 hours.   Cardiac EnzymesNo results for input(s): TROPONINI in the last 168 hours. No results for input(s): TROPIPOC in the last 168 hours.  Chemistry Recent Labs  Lab 04/04/19 2000 04/05/19 0425  NA 133* 134*  K 3.2* 3.5  CL 99 101  CO2 21* 23  GLUCOSE 92 100*  BUN 14 14  CREATININE 0.67 0.81  CALCIUM 8.4* 8.2*  PROT 4.9*  --   ALBUMIN 2.5*  --   AST 32  --   ALT 18  --   ALKPHOS 53  --   BILITOT 0.8  --   GFRNONAA >60 >60  GFRAA >60 >60  ANIONGAP 13 10    Hematology Recent Labs  Lab 04/04/19 2000  WBC 4.5  RBC 3.17*  HGB 10.2*  HCT 30.0*  MCV 94.6  MCH 32.2  MCHC 34.0  RDW 12.7  PLT 247   BNP Recent Labs  Lab 04/04/19 2000  BNP 76.7    DDimer No results for input(s): DDIMER in the last 168 hours.   Radiology  No results found.   Patient Profile  Joanna Ali is  a 68 y.o. female with hypertension, osteoarthritis and macular degeneration and anasarca.  She was admitted 04/04/2019 for worsening lower extremity edema, as well as weakness in her legs.  Assessment & Plan  #Lower extremity edema/leg weakness -She has had extremely extensive work-up including negative rheumatologic panels.  She has had 2 months of intermittent low-grade fevers, lower extremity edema and weakness.  She has been noted to have normal left ventricular ejection fraction, and moderate aortic insufficiency.  She has no evidence of endocarditis on examination.  Her inflammatory markers have been unrevealing.  Arterial duplexes of the lower extremities showed no evidence of PAD to explain her symptoms. -We will plan to repeat all of her inflammatory markers including CRP, ESR, rheumatoid factor, ANCA, anti-Jo/Ro antibodies -I will plan for a CT chest and pelvis to look for any underlying malignancy or possibly obstructive process to explain this -Her BNP was normal, but we will repeat an echocardiogram but her recent imaging did not reveal any evidence of cardiac tamponade or significant aortic valve insufficiency explain her symptoms.  She does have a pericardial effusion, but this could be seen in the setting of an inflammatory disorder. -Due to the progressive weakness I have reached out to our neurology colleagues for an assessment -I will also send SPEP UPEP as she was noted to have a M spike and this could be a paraneoplastic syndrome we are missing.  Additionally we will look for any obstructive process or malignancy we could be missing with a CT chest, abdomen and pelvis -I will continue with Lasix 40 mg IV twice daily for now -We will also rule out any sort of thrombus in her lower extremity veins with a venous duplex today    For questions or updates, please contact [facility] Please consult [email or web address] for contact info under        Signed, Joanna Ali, [place]  04/05/2019 8:56 AM

## 2019-04-05 NOTE — Progress Notes (Signed)
Lower extremity venous has been completed.   Preliminary results in CV Proc.   Abram Sander 04/05/2019 11:26 AM

## 2019-04-05 NOTE — Telephone Encounter (Signed)
error 

## 2019-04-05 NOTE — Consult Note (Addendum)
Neurology Consultation  Reason for Consult: Leg weakness Referring Physician: Dr. Davina Poke  History is obtained from: Patient  HPI: Joanna Ali is a 68 y.o. female with past medical history of shortness of breath, leg swelling, hypertension.  Patient states on 1 July she noted that her feet were getting swollen.  Over a period of time the swelling moved up her legs.  She is gone to her PCP who apparently told her that it was normal for age as the venous insufficiency becomes more pronounced.  As she noticed that the swelling increased and continued to move up to her knees she asked her primary care physician to make a referral to a rheumatologist.  She finally got to a rheumatologist had multiple test and all were negative.  Patient then called Dr. Claiborne Billings who ordered a echocardiogram that was somewhat abnormal but then followed up by a TEE which did not show any vegetation or abnormalities.  Patient was started on Lasix.  In the interim patient had inflammatory markers which were unrevealing, arterial duplex lower extremity which did not show any evidence of PAD to explain her symptoms.  she continued to have anasarca and bilateral legs to the point where she needed to come to the hospital.  While in the hospital she had venous Dopplers of lower extremity which did not show any deep vein thrombosis on either leg.  Patient states that compared to yesterday the anasarca and fluid retention in her legs have gone down significantly.  She also states that she has been having significant pain and burning around ankles for approximately 2 to 3 weeks.  Currently she feels although the fluid retention has significantly been reduced the pain to pressure is extremely significant, however, the pain that she had to light touch yesterday has improved.  Neurology was consulted this secondary to questionable weakness in lower extremities.   Chart review: No prior neurological notes  ROS: A 14 point ROS was performed and  is negative except as noted in the HPI.   Past Medical History:  Diagnosis Date  . Hypertension   . Leg swelling 03/2019  . Short of breath on exertion 03/2019    Family History  Problem Relation Age of Onset  . Hypertension Mother    Social History:   reports that she quit smoking about 40 years ago. Her smoking use included cigarettes. She has never used smokeless tobacco. [name].  Medications  Current Facility-Administered Medications:  .  0.9 %  sodium chloride infusion, 250 mL, Intravenous, PRN, Daune Perch, NP .  acetaminophen (TYLENOL) tablet 650 mg, 650 mg, Oral, Q6H PRN, Durenda Age, MD, 650 mg at 04/04/19 2212 .  carvedilol (COREG) tablet 12.5 mg, 12.5 mg, Oral, BID WC, O'Neal, Cassie Freer, MD, 12.5 mg at 04/05/19 0833 .  enoxaparin (LOVENOX) injection 40 mg, 40 mg, Subcutaneous, Q24H, Phylliss Bob, Janine, NP, 40 mg at 04/04/19 2057 .  furosemide (LASIX) injection 60 mg, 60 mg, Intravenous, Q12H, O'Neal, Cassie Freer, MD, 60 mg at 04/05/19 0834 .  hydrALAZINE (APRESOLINE) injection 10 mg, 10 mg, Intravenous, Q6H PRN, O'Neal, Cassie Freer, MD .  ondansetron [facility]) injection 4 mg, 4 mg, Intravenous, Q6H PRN, Durenda Age, MD, 4 mg at 04/04/19 2212 .  sodium chloride flush (NS) 0.9 % injection 3 mL, 3 mL, Intravenous, PRN, Daune Perch, NP   Exam: Current vital signs: BP 116/79   Pulse 92   Temp 98.8 F (37.1 C) (  Oral)   Wt 65.1 kg   SpO2 97%   BMI 25.42 kg/m  Vital signs in last 24 hours: Temp:  [98.6 F (37 C)-98.8 F (37.1 C)] 98.8 F (37.1 C) (09/15 2058) Pulse Rate:  [85-109] 92 (09/16 0833) BP: (99-136)/(66-91) 116/79 (09/16 0833) SpO2:  [97 %-99 %] 97 % (09/16 0347) Weight:  [65.1 kg] 65.1 kg (09/15 1750)  Physical Exam  Constitutional: Appears well-developed and well-nourished.  Psych: Affect appropriate to situation Eyes: No scleral injection HENT: No OP obstrucion Head:  Normocephalic.  Cardiovascular: Normal rate and regular rhythm.  Respiratory: Effort normal, non-labored breathing GI: Soft.  No distension. There is no tenderness.  Skin: WDI  Neuro: Mental Status: Patient is awake, alert, oriented to person, place, month, year, and situation. Patient is able to give a clear and coherent history. No signs of aphasia or neglect Cranial Nerves: II: Visual Fields are full.  III,IV, VI: EOMI without ptosis or diploplia. Pupils equal, round and reactive to light V: Facial sensation is symmetric to temperature VII: Facial movement is symmetric.  VIII: hearing is intact to voice X: Palat elevates symmetrically XI: Shoulder shrug is symmetric. XII: tongue is midline without atrophy or fasciculations.  Motor: Tone is normal. Bulk is normal.  Bilateral upper extremities 5/5.  Bilateral lower extremities 5/5.  It should be noted that she does show full strength however exam must be done carefully thus do not put pressure, as this causes significant pain and will obstruct the exam. 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 Deep Tendon Reflexes: 2+ and symmetric in the biceps and patellae.  Plantars: Unable to assess secondary to pain Cerebellar: Finger-nose-finger within normal limits  Labs I have reviewed labs in epic and the results pertinent to this consultation are:   CBC    Component Value Date/Time   WBC 4.5 04/04/2019 2000   RBC 3.17 (L) 04/04/2019 2000   HGB 10.2 (L) 04/04/2019 2000   HGB 11.2 03/23/2019 1145   HCT 30.0 (L) 04/04/2019 2000   HCT 33.3 (L) 03/23/2019 1145   PLT 247 04/04/2019 2000   PLT 243 03/23/2019 1145   MCV 94.6 04/04/2019 2000   MCV 93 03/23/2019 1145   MCH 32.2 04/04/2019 2000   MCHC 34.0 04/04/2019 2000   RDW 12.7 04/04/2019 2000   RDW 11.9 03/23/2019 1145   LYMPHSABS 1.2 03/23/2019 1145   EOSABS 0.2  03/23/2019 1145   BASOSABS 0.0 03/23/2019 1145    CMP     Component Value Date/Time   NA 134 (L) 04/05/2019 0425   NA 134 03/23/2019 1150   K 3.5 04/05/2019 0425   CL 101 04/05/2019 0425   CO2 23 04/05/2019 0425   GLUCOSE 100 (H) 04/05/2019 0425   BUN 14 04/05/2019 0425   BUN 23 03/23/2019 1150   CREATININE 0.81 04/05/2019 0425   CALCIUM 8.2 (L) 04/05/2019 0425   PROT 4.9 (L) 04/04/2019 2000   PROT 5.2 (L) 03/20/2019 1647   ALBUMIN 2.5 (L) 04/04/2019 2000   ALBUMIN 3.2 (L) 03/20/2019 1647   AST 32 04/04/2019 2000   ALT 18 04/04/2019 2000   ALKPHOS 53 04/04/2019 2000   BILITOT 0.8 04/04/2019 2000   BILITOT 0.3 03/20/2019 1647   GFRNONAA >60 04/05/2019 0425   GFRAA >60 04/05/2019 0425    Lipid Panel  No results found for: CHOL, TRIG, HDL, CHOLHDL, VLDL, LDLCALC, LDLDIRECT   Imaging I have reviewed the  images obtained:   Etta Quill PA-C [facility] [phone]  M-F  (9:00 am- 5:00 PM)  04/05/2019, 2:20 PM     Assessment:  68 year old female with 1 month history of increasing anasarca bilateral lower extremities followed by significant paresthetic pain bilaterally.    The neurologic pain is likely secondary to stretching of cutaneous nerves secondary to significant anasarca.   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   I have reviewed the contents of history and physical exam as documented by PA/ARNP/Resident and agree with above documentation.  I have discussed and formulated the above plan as documented. Edits to the note have been made as needed.   68 year old female presenting with 1 month history of progressive bilateral lower extremity pitting edema.  She has had extensive work-up by cardiology and rheumatology for this condition.   Neurology consulted for bilateral lower extremity weakness and numbness.  On examination, patient is alert and oriented x4.  Cranial nerves are intact, Patient has 5/5 strength B/L to hip flexion, hip extension hip abduction and hip adduction.  5 /5 strength B/L knee flexion and knee extension.  She also has a least 4/5 strength to plantarflexion and plantar extension in both legs. Normal strength in both upper extremities.   Reflexes are 2+ patellar as well as ankle reflexes.  2+ reflexes over biceps, brachioradialis and triceps (essentially reflexes are normal). Sensory exam is also normal, she has intact light touch, pinprick, intact vibration sensation in lower extremities.  No allodynia as well.  She complains of subjective paresthesia over the lateral aspect of her left thigh today which is new for her.  She complains of burning sensation around her ankles but not her feet.  She does not have allodynia as light touch does not elicit pain.  Most of the pain is due to tenderness from painful pitting edema. Other thing noted on exam is splinter hemorrhages.  Impression: Her exam is not consistent with peripheral neuropathy or neuromuscular condition. She does not  have significant muscular weakness, rather weakness in her legs is due to limitation of movement from pain in her joints as well as due to tenderness.  The mild paresthesia she experiences over the left lateral aspect of her thigh is probably from compression of the lateral femoral cutaneous nerve, and this is not concerning.  Relevant lab workup  CK 40, CRP mildly elevated at 37.   She also slightly low TIBC and high ferritin. Cardiology working up for anasarca, most common causes include pulmonary hypertension, heart failure (cardiology does not feel this is the case), being worked up for inferior vena cava obstruction. Does respond to Lasix so this is not myxedema.  TSH is also normal.    Recommendations -Continue to follow lab  work and imaging.  I do not think any neuro imaging would help in diagnosis.  Also do not feel strongly EMG/nerve conduction study would be of high yield however if patient's symptoms continue then can consider outpatient EMG nerve conduction study.   Karena Addison Aroor MD [facility] [phone]   If 7pm to 7am, please call on call as listed on AMION.

## 2019-04-05 NOTE — Progress Notes (Signed)
  Echocardiogram 2D Echocardiogram has been performed.  Joanna Ali 04/05/2019, 4:26 PM

## 2019-04-06 ENCOUNTER — Encounter (HOSPITAL_COMMUNITY): Payer: Self-pay | Admitting: Oncology

## 2019-04-06 ENCOUNTER — Encounter: Payer: No Typology Code available for payment source | Admitting: Adult Health

## 2019-04-06 DIAGNOSIS — I10 Essential (primary) hypertension: Secondary | ICD-10-CM

## 2019-04-06 DIAGNOSIS — D472 Monoclonal gammopathy: Secondary | ICD-10-CM

## 2019-04-06 DIAGNOSIS — J90 Pleural effusion, not elsewhere classified: Secondary | ICD-10-CM

## 2019-04-06 LAB — PROTEIN ELECTROPHORESIS, SERUM
A/G Ratio: 1.5 (ref 0.7–1.7)
Albumin ELP: 2.9 g/dL (ref 2.9–4.4)
Alpha-1-Globulin: 0.3 g/dL (ref 0.0–0.4)
Alpha-2-Globulin: 0.6 g/dL (ref 0.4–1.0)
Beta Globulin: 0.6 g/dL — ABNORMAL LOW (ref 0.7–1.3)
Gamma Globulin: 0.6 g/dL (ref 0.4–1.8)
Globulin, Total: 2 g/dL — ABNORMAL LOW (ref 2.2–3.9)
Total Protein ELP: 4.9 g/dL — ABNORMAL LOW (ref 6.0–8.5)

## 2019-04-06 LAB — BASIC METABOLIC PANEL
Anion gap: 11 (ref 5–15)
BUN: 17 mg/dL (ref 8–23)
CO2: 24 mmol/L (ref 22–32)
Calcium: 8.2 mg/dL — ABNORMAL LOW (ref 8.9–10.3)
Chloride: 99 mmol/L (ref 98–111)
Creatinine, Ser: 0.83 mg/dL (ref 0.44–1.00)
GFR calc Af Amer: >60 mL/min (ref >60)
GFR calc non Af Amer: >60 mL/min (ref >60)
Glucose, Bld: 83 mg/dL (ref 70–99)
Potassium: 3.3 mmol/L — ABNORMAL LOW (ref 3.5–5.1)
Sodium: 134 mmol/L — ABNORMAL LOW (ref 135–145)

## 2019-04-06 LAB — ANTI-JO 1 ANTIBODY, IGG: Anti JO-1: <0.2 AI (ref 0.0–0.9)

## 2019-04-06 LAB — LIPID PANEL
Cholesterol: 74 mg/dL (ref 0–200)
HDL: 27 mg/dL — ABNORMAL LOW (ref >40)
LDL Cholesterol: 33 mg/dL (ref 0–99)
Total CHOL/HDL Ratio: 2.7 RATIO
Triglycerides: 72 mg/dL (ref <150)
VLDL: 14 mg/dL (ref 0–40)

## 2019-04-06 LAB — MAGNESIUM: Magnesium: 1.3 mg/dL — ABNORMAL LOW (ref 1.7–2.4)

## 2019-04-06 LAB — TROPONIN I (HIGH SENSITIVITY): Troponin I (High Sensitivity): 10 ng/L (ref <18)

## 2019-04-06 LAB — ANCA TITERS
Atypical P-ANCA titer: <1:20 {titer}
C-ANCA: <1:20 {titer}
P-ANCA: <1:20 {titer}

## 2019-04-06 LAB — ANA W/REFLEX IF POSITIVE: Anti Nuclear Antibody (ANA): NEGATIVE

## 2019-04-06 LAB — RHEUMATOID FACTOR: Rheumatoid fact SerPl-aCnc: <10 IU/mL (ref 0.0–13.9)

## 2019-04-06 LAB — SJOGRENS SYNDROME-A EXTRACTABLE NUCLEAR ANTIBODY: SSA (Ro) (ENA) Antibody, IgG: <0.2 AI (ref 0.0–0.9)

## 2019-04-06 LAB — CHOLESTEROL, TOTAL: Cholesterol: 68 mg/dL (ref 0–200)

## 2019-04-06 MED ORDER — MAGNESIUM SULFATE 2 GM/50ML IV SOLN
2.0000 g | Freq: Once | INTRAVENOUS | Status: AC
  Administered 2019-04-06: 2 g via INTRAVENOUS
  Filled 2019-04-06: qty 50

## 2019-04-06 MED ORDER — POTASSIUM CHLORIDE CRYS ER 20 MEQ PO TBCR
40.0000 meq | EXTENDED_RELEASE_TABLET | ORAL | Status: AC
  Administered 2019-04-06 (×2): 40 meq via ORAL
  Filled 2019-04-06 (×2): qty 2

## 2019-04-06 MED ORDER — FUROSEMIDE 10 MG/ML IJ SOLN
40.0000 mg | Freq: Two times a day (BID) | INTRAMUSCULAR | Status: DC
  Administered 2019-04-06 – 2019-04-07 (×3): 40 mg via INTRAVENOUS
  Filled 2019-04-06 (×3): qty 4

## 2019-04-06 MED ORDER — POTASSIUM CHLORIDE CRYS ER 20 MEQ PO TBCR
40.0000 meq | EXTENDED_RELEASE_TABLET | ORAL | Status: AC
  Administered 2019-04-06 – 2019-04-07 (×2): 40 meq via ORAL
  Filled 2019-04-06 (×2): qty 2

## 2019-04-06 NOTE — Progress Notes (Signed)
Cardiology Progress Note  Patient ID: Joanna Ali MRN: [id number] DOB: [birth year] Date of Encounter: 04/06/2019  Primary Cardiologist: Shelva Majestic, MD  Subjective  Lab work concerning for severely low albumin level.  Her cholesterols are level as well.  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  She has diuresed well, but still has gross volume overload with anasarca.  She reports she feels better with diuresis.  No complaints this morning.  ROS:  All other ROS reviewed and negative. Pertinent positives noted in the HPI.     Inpatient Medications  Scheduled Meds:  carvedilol  12.5 mg Oral BID WC   enoxaparin (LOVENOX) injection  40 mg Subcutaneous Q24H   furosemide  40 mg Intravenous Q12H   potassium chloride  40 mEq Oral Q4H   Continuous Infusions:  sodium chloride     magnesium sulfate bolus IVPB 2 g (04/06/19 0810)   PRN Meds: sodium chloride, acetaminophen, hydrALAZINE, ondansetron (ZOFRAN) IV, sodium chloride flush   Vital Signs   Vitals:   04/05/19 2119 04/05/19 2130 04/05/19 2227 04/06/19 0434  BP:  97/60 94/66 98/66   Pulse:  87 85 75  Resp:  20 20   Temp: 98.6 F (37 C)   97.8 F (36.6 C)  TempSrc: Oral   Oral  SpO2:  95% 96% 96%  Weight:    61.7 kg    Intake/Output Summary (Last 24 hours) at 04/06/2019 0845 Last data filed at 04/06/2019 0439 Gross per 24 hour  Intake 150 ml  Output 2675 ml  Net -2525 ml   Last 3 Weights 04/06/2019 04/04/2019 04/04/2019  Weight (lbs) 136 lb 1.6 oz 143 lb 8 oz 142 lb  Weight (kg) 61.735 kg 65.091 kg 64.411 kg      Telemetry  Overnight telemetry shows normal sinus rhythm, heart rate in the 70-80 range, which I personally reviewed.   Physical Exam   Vitals:   04/05/19 2119 04/05/19 2130 04/05/19 2227 04/06/19 0434  BP:  97/60 94/66 98/66   Pulse:  87 85 75  Resp:  20 20   Temp: 98.6 F (37 C)   97.8 F (36.6 C)  TempSrc: Oral   Oral  SpO2:  95% 96% 96%  Weight:    61.7 kg     Intake/Output Summary  (Last 24 hours) at 04/06/2019 0845 Last data filed at 04/06/2019 0439 Gross per 24 hour  Intake 150 ml  Output 2675 ml  Net -2525 ml    Last 3 Weights 04/06/2019 04/04/2019 04/04/2019  Weight (lbs) 136 lb 1.6 oz 143 lb 8 oz 142 lb  Weight (kg) 61.735 kg 65.091 kg 64.411 kg    Body mass index is 24.11 kg/m.   General: Well nourished, well developed, in no acute distress Heart: Atraumatic, normal size  Eyes: PEERLA, EOMI  Neck: Supple, no JVD Endocrine: No thryomegaly Cardiac: Normal S1, S2; RRR Lungs: Crackles present bilaterally at the lung bases Abd: Soft, nontender, no hepatomegaly  Ext: 2+ pitting edema, she has significant thigh edema into the buttocks Musculoskeletal: No deformities, BUE and BLE strength normal and equal Skin: Warm and dry, no rashes   Neuro: Alert and oriented to person, place, time, and situation, CNII-XII grossly intact, no focal deficits  Psych: Normal mood and affect   Labs  High Sensitivity Troponin:  No results for input(s): TROPONINIHS in the last 720 hours.   Cardiac EnzymesNo results for input(s): TROPONINI in the last 168 hours. No results for input(s): TROPIPOC in the last 168 hours.  Chemistry Recent  Labs  Lab 04/04/19 2000 04/05/19 0425 04/06/19 0424  NA 133* 134* 134*  K 3.2* 3.5 3.3*  CL 99 101 99  CO2 21* 23 24  GLUCOSE 92 100* 83  BUN 14 14 17   CREATININE 0.67 0.81 0.83  CALCIUM 8.4* 8.2* 8.2*  PROT 4.9*  --   --   ALBUMIN 2.5*  --   --   AST 32  --   --   ALT 18  --   --   ALKPHOS 53  --   --   BILITOT 0.8  --   --   GFRNONAA >60 >60 >60  GFRAA >60 >60 >60  ANIONGAP 13 10 11     Hematology Recent Labs  Lab 04/04/19 2000  WBC 4.5  RBC 3.17*  HGB 10.2*  HCT 30.0*  MCV 94.6  MCH 32.2  MCHC 34.0  RDW 12.7  PLT 247   BNP Recent Labs  Lab 04/04/19 2000  BNP 76.7    DDimer No results for input(s): DDIMER in the last 168 hours.   Radiology  Ct Chest W Contrast  Result Date: 04/05/2019 CLINICAL DATA:  Weakness,  anasarca, dyspnea EXAM: CT CHEST, ABDOMEN, AND PELVIS WITH CONTRAST TECHNIQUE: Multidetector CT imaging of the chest, abdomen and pelvis was performed following the standard protocol during bolus administration of intravenous contrast. CONTRAST:  131m OMNIPAQUE IOHEXOL 300 MG/ML  SOLN COMPARISON:  None. FINDINGS: CT CHEST FINDINGS Cardiovascular: Mild cardiomegaly. Small to moderate pericardial effusion. Thoracic aorta is nonaneurysmal. No acute vascular findings. Mediastinum/Nodes: No axillary, mediastinal, or hilar lymphadenopathy. Unremarkable thyroid, trachea, and esophagus. Lungs/Pleura: Small bilateral pleural effusions, left slightly greater than right. Compressive atelectasis in the left lung base. Lungs are otherwise clear. No pneumothorax. Musculoskeletal: Multiple chronic healed left-sided rib fractures. No acute osseous findings. CT ABDOMEN PELVIS FINDINGS Hepatobiliary: No focal liver abnormality is seen. No gallstones, gallbladder wall thickening, or biliary dilatation. Pancreas: Unremarkable. No pancreatic ductal dilatation or surrounding inflammatory changes. Spleen: Normal in size without focal abnormality. Adrenals/Urinary Tract: Adrenal glands are unremarkable. Kidneys are normal, without renal calculi, focal lesion, or hydronephrosis. Bladder is unremarkable. Stomach/Bowel: Stomach is within normal limits. Appendix not clearly visualized. Bowel well opacified with oral contrast. No evidence of bowel wall thickening, distention, or inflammatory changes. Vascular/Lymphatic: No significant vascular findings are present. No enlarged abdominal or pelvic lymph nodes. Reproductive: Uterus and bilateral adnexa are unremarkable. Other: Small fat containing umbilical hernia. No ascites. Mild diffuse anasarca of the abdomen, pelvis and visualized proximal lower extremities. Musculoskeletal: No acute or significant osseous findings. IMPRESSION: 1. Findings of fluid overload including small bilateral pleural  effusions, small-to-moderate pericardial effusion, and mild diffuse anasarca of the abdomen and pelvis. 2. No acute abdominopelvic findings. Electronically Signed   By: NDavina PokeM.D.   On: 04/05/2019 14:52   Ct Abdomen Pelvis W Contrast  Result Date: 04/05/2019 CLINICAL DATA:  Weakness, anasarca, dyspnea EXAM: CT CHEST, ABDOMEN, AND PELVIS WITH CONTRAST TECHNIQUE: Multidetector CT imaging of the chest, abdomen and pelvis was performed following the standard protocol during bolus administration of intravenous contrast. CONTRAST:  1032mOMNIPAQUE IOHEXOL 300 MG/ML  SOLN COMPARISON:  None. FINDINGS: CT CHEST FINDINGS Cardiovascular: Mild cardiomegaly. Small to moderate pericardial effusion. Thoracic aorta is nonaneurysmal. No acute vascular findings. Mediastinum/Nodes: No axillary, mediastinal, or hilar lymphadenopathy. Unremarkable thyroid, trachea, and esophagus. Lungs/Pleura: Small bilateral pleural effusions, left slightly greater than right. Compressive atelectasis in the left lung base. Lungs are otherwise clear. No pneumothorax. Musculoskeletal: Multiple chronic healed left-sided rib  fractures. No acute osseous findings. CT ABDOMEN PELVIS FINDINGS Hepatobiliary: No focal liver abnormality is seen. No gallstones, gallbladder wall thickening, or biliary dilatation. Pancreas: Unremarkable. No pancreatic ductal dilatation or surrounding inflammatory changes. Spleen: Normal in size without focal abnormality. Adrenals/Urinary Tract: Adrenal glands are unremarkable. Kidneys are normal, without renal calculi, focal lesion, or hydronephrosis. Bladder is unremarkable. Stomach/Bowel: Stomach is within normal limits. Appendix not clearly visualized. Bowel well opacified with oral contrast. No evidence of bowel wall thickening, distention, or inflammatory changes. Vascular/Lymphatic: No significant vascular findings are present. No enlarged abdominal or pelvic lymph nodes. Reproductive: Uterus and bilateral  adnexa are unremarkable. Other: Small fat containing umbilical hernia. No ascites. Mild diffuse anasarca of the abdomen, pelvis and visualized proximal lower extremities. Musculoskeletal: No acute or significant osseous findings. IMPRESSION: 1. Findings of fluid overload including small bilateral pleural effusions, small-to-moderate pericardial effusion, and mild diffuse anasarca of the abdomen and pelvis. 2. No acute abdominopelvic findings. Electronically Signed   By: Davina Poke M.D.   On: 04/05/2019 14:52   Dg Chest Port 1 View  Result Date: 04/05/2019 CLINICAL DATA:  Shortness of breath EXAM: PORTABLE CHEST 1 VIEW COMPARISON:  CT from earlier in the same day. FINDINGS: Progressive left basilar infiltrate is noted with small left pleural effusion. Tiny right pleural effusion is seen. No pneumothorax is noted. Cardiac shadow is enlarged. No bony abnormality is seen. IMPRESSION: Increasing left basilar infiltrate and effusion. Tiny right effusion. Electronically Signed   By: Inez Catalina M.D.   On: 04/05/2019 21:25   Vas [place] Lower Extremity Venous (dvt)  Result Date: 04/05/2019  Lower Venous Study Indications: Edema.  Comparison Study: no prior Performing Technologist: Abram Sander RVS  Examination Guidelines: A complete evaluation includes B-mode imaging, spectral Doppler, color Doppler, and power Doppler as needed of all accessible portions of each vessel. Bilateral testing is considered an integral part of a complete examination. Limited examinations for reoccurring indications may be performed as noted.  +---------+---------------+---------+-----------+----------+--------------+  RIGHT     Compressibility Phasicity Spontaneity Properties Thrombus Aging  +---------+---------------+---------+-----------+----------+--------------+  CFV       Full            Yes       Yes                                    +---------+---------------+---------+-----------+----------+--------------+  SFJ       Full                                                              +---------+---------------+---------+-----------+----------+--------------+  FV Prox   Full                                                             +---------+---------------+---------+-----------+----------+--------------+  FV Mid    Full                                                             +---------+---------------+---------+-----------+----------+--------------+  FV Distal Full                                                             +---------+---------------+---------+-----------+----------+--------------+  PFV       Full                                                             +---------+---------------+---------+-----------+----------+--------------+  POP       Full            Yes       Yes                                    +---------+---------------+---------+-----------+----------+--------------+  PTV       Full                                                             +---------+---------------+---------+-----------+----------+--------------+  PERO                                                       Not visualized  +---------+---------------+---------+-----------+----------+--------------+   +---------+---------------+---------+-----------+----------+--------------+  LEFT      Compressibility Phasicity Spontaneity Properties Thrombus Aging  +---------+---------------+---------+-----------+----------+--------------+  CFV       Full            Yes       Yes                                    +---------+---------------+---------+-----------+----------+--------------+  SFJ       Full                                                             +---------+---------------+---------+-----------+----------+--------------+  FV Prox   Full                                                             +---------+---------------+---------+-----------+----------+--------------+  FV Mid    Full                                                              +---------+---------------+---------+-----------+----------+--------------+  FV Distal Full                                                             +---------+---------------+---------+-----------+----------+--------------+  PFV       Full                                                             +---------+---------------+---------+-----------+----------+--------------+  POP       Full            Yes       Yes                                    +---------+---------------+---------+-----------+----------+--------------+  PTV       Full                                                             +---------+---------------+---------+-----------+----------+--------------+  PERO                                                       Not visualized  +---------+---------------+---------+-----------+----------+--------------+     Summary: Right: There is no evidence of deep vein thrombosis in the lower extremity. No cystic structure found in the popliteal fossa. Left: There is no evidence of deep vein thrombosis in the lower extremity. No cystic structure found in the popliteal fossa.  *See table(s) above for measurements and observations. Electronically signed by Harold Barban MD on 04/05/2019 at 4:31:10 PM.    Final     Cardiac Studies  TTE 04/06/2019  1. Left ventricular ejection fraction, by visual estimation, is 60 to 65%. The left ventricle has normal function. Normal left ventricular size. Left ventricular septal wall thickness was normal. Normal left ventricular posterior wall thickness. There  is no left ventricular hypertrophy.  2. Left ventricular diastolic Doppler parameters are consistent with pseudonormalization pattern of LV diastolic filling.  3. Global right ventricle has normal systolic function.The right ventricular size is normal. No increase in right ventricular wall thickness.  4. Left atrial size was mildly dilated.  5. Right atrial size was normal.  6. Large pericardial  effusion.  7. The pericardial effusion is circumferential.  8. There is inversion of the right atrial wall, excessive respiratory variation in the tricuspid valve spectral Doppler velocities and excessive respiratory variation in the mitral valve spectral Doppler velocities.  9. Pericardial effusion measuring up to 1.36 cm. Findings concerning for early tamponade physiology. 10. The mitral valve is normal in structure. Mild mitral valve regurgitation. No evidence of mitral stenosis. 11. The tricuspid valve is normal in structure. Tricuspid valve regurgitation is mild. 12. The aortic valve The aortic valve is tricuspid  Aortic valve regurgitation is moderate by color flow Doppler. Structurally normal aortic valve, with no evidence of sclerosis or stenosis. 13. The pulmonic valve was normal in structure. Pulmonic valve regurgitation is trivial by color flow Doppler. 14. Moderately elevated pulmonary artery systolic pressure. 15. The inferior vena cava is dilated in size with >50% respiratory variability, suggesting right atrial pressure of 8 mmHg. 16. Small patent foramen ovale with predominantly right to left shunting across the atrial septum.  Patient Profile  Joanna Ali is a 68 y.o. female with medical history significant for hypertension, who has had a rapidly progressive onset of anasarca and shortness of breath.  She was admitted for volume overload and further work-up on 9/15.  Assessment & Plan  #Anasarca/volume overload -Her work-up thus far has been inconsistent with a cardiac etiology for her volume overload.  Interestingly, her serum protein levels and albumin are very low; I discussed her case with gastroenterology and they feel an oncology work-up is needed first. -She reported he had an M spike on her outside hematologic work-up.  -Her BNP is normal, and I have checked a high-sensitivity troponin today.  She does have a pericardial effusion, but this could all just be related to her  low albumin status. -Her echocardiogram is inconsistent with a thick heart concerning for cardiac involvement of her amyloidosis, but this could be possible.  She does have low voltage on her EKG. -Her blood cultures have been negative, and I did a TEE last week on her, that did not reveal any endocarditis.  She did have thickening of the aortic valve with moderate aortic insufficiency, but this appears degenerative.  If she ends up having amyloidosis, I think we will proceed with cardiac MRI just to ensure there is no involvement of her heart. -I did send an extensive rheumatologic panel including rheumatoid factor, ANCA, anti-Jo/Ro antibodies -Her SPEP and UPEP are pending here -Her cholesterol is extremely low, in the 60s and this is total cholesterol, also concerning for gout loss of protein -It is also interesting that her CT scan was negative, but they did comment on rib fractures that appear chronic, I do wonder if this represents lytic lesions or possibly early myeloma versus amyloid: I have asked for an oncology consult to assist with this possibility -For her volume overload today we will continue with Lasix twice daily -Overall, 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   #Pericardial effusion, moderate, circumferential -She has a moderate pericardial effusion that is circumferential, without any evidence of tamponade.  She does have a significant anterior fat pad in the pericardial space, but this is a benign entity. -Her BNP and IVC are inconsistent with a diagnosis of tamponade -I think this is all related to low oncotic pressure due to hypoalbuminemia  #Pleural effusions bilaterally -Related to low oncotic pressure continue with diuresis  #Moderate aortic insufficiency -Valve appears thickened likely degenerative there is no aortic root enlargement -There could be involvement of amyloidosis,  and we will probably pursue cardiac MRI after we receive input from oncology consult team  #Hypertension -Continue beta-blocker for now  # FEN - no IVF - regular diet - dvt ppx: lovenox -code: full   For questions or updates, please contact [place] Please consult [email or web address] for contact info under        Signed, [place] Bells T. Audie Box, [place]  04/06/2019 8:45 AM

## 2019-04-06 NOTE — Consult Note (Addendum)
[place]  [phone] [phone] Clar[place]  Referral MD: Dr. Cassie Freer O'Neal  Reason for Referral: Elevated M-spike, ? amyloidosis.  HPI: Joanna Ali is a 68 year old female with a past medical history significant for hypertension, osteoarthritis, and macular degeneration.  The patient was actually supposed to be seen at the cancer center today as a new patient.  She was referred by rheumatology for an elevated M spike. Unfortunately, we did not receive a copy of her labs with referral.  I have asked my [facility] department to please reach out to obtain these and scanned them in to Epic for our records. The patient was admitted to the hospital secondary to shortness of breath and anasarca.  The patient reports that starting in early July she noticed that her feet were getting swollen.  Over time, the swelling worsened and her entire legs were swollen.  She was initially evaluated and told this was related to venous insufficiency.  Per her request, she was referred to a rheumatologist and had multiple tests which the patient reports as normal other than an elevated M spike for which she was referred to hematology.  She was also seen by cardiology who performed an echocardiogram (03/22/2019- moderate pericardial effusion with no evidence of overt tamponade, pericardial fluid appears to have a fibrous stranding left ventricular ejection fraction was greater than 65%, mitral and tricuspid valves were grossly normal, mild thickening of the aortic valve with aortic valve regurgitation which is moderate to severe) and TEE (no evidence of infective endocarditis, moderate degree of aortic regurgitation is present, right ventricle with normal systolic function, moderate pericardial effusion without evidence of tamponade).  She was started on Lasix.  Inflammatory markers were unrevealing and arterial duplex of the lower extremities did not show any  evidence of PAD to explain her symptoms.  Ultrasound of the bilateral lower extremities performed on 03/23/2019 showed no evidence of arterial occlusive disease.  She also had an ultrasound of the aorta which showed no evidence of abdominal aortic aneurysm and no stenosis in the aorta or bilateral iliac arteries.  Due to continued worsening of her symptoms, she was admitted for further evaluation.  The patient has been started on Lasix.  She had a Doppler ultrasound of the bilateral lower extremities which showed no evidence of DVT.  Repeat echocardiogram again showed left ventricular ejection fraction of 60 to 65%, large pericardial effusion, findings concerning for early tamponade physiology.  The patient had a CT of the chest, abdomen, pelvis performed on 04/05/2019 which showed findings consistent with fluid overload including small bilateral pleural effusions, small to moderate pericardial effusion, and mild diffuse anasarca of the abdomen and pelvis.  On admission her CBC showed a hemoglobin of 10.2 with hematocrit 30.0, with normal MCV.  Her CMET showed a sodium of 133, potassium 3.1, chloride 94, total protein 5.2, albumin 3.2.  Additional labs obtained include an ANA which was negative, nonreactive, C-reactive protein 4.0, ESR 15, BNP normal at 76.7, uric acid normal at 6.4, C-ANCA and P-ANCA both less than 1: 20, LDH mildly elevated at 206, SPEP with no M spike observed, Sjogrens syndrome <0.2, Anti JO-1 <0.2, ferritin 379, total cholesterol 74, triglycerides 72, HDL 27, LDL 33, magnesium 1.3. Rheumatoid factor, UPEP, immunoglobulins pending.  When seen today, the patient reports that she feels much better.  Her lower extremity edema has responded well to IV Lasix.  Still feels tight in her legs particularly on lateral  aspect of her bilateral thighs.  She reports that she has not really been short of breath except with exertion.  She denies having lower extremity numbness but has had some pain/burning  around her ankles.  Denies fevers and chills.  Denies headaches and vision changes.  Denies chest discomfort and cough.  Denies abdominal pain, nausea, vomiting, constipation, diarrhea.  She has not noticed any bleeding.  Hematology was asked see the patient to make recommendations regarding her elevated M spike and to evaluate for possible amyloidosis.   Past Medical History:  Diagnosis Date  . Hypertension   . Leg swelling 03/2019  . Short of breath on exertion 03/2019  :  Past Surgical History:  Procedure Laterality Date  . APPENDECTOMY    . NECK SURGERY    . TEE WITHOUT CARDIOVERSION N/A 03/28/2019   Procedure: TRANSESOPHAGEAL ECHOCARDIOGRAM (TEE);  Surgeon: Geralynn Rile, MD;  Location: [place];  Service: Cardiology;  Laterality: N/A;  :  Current Facility-Administered Medications  Medication Dose Route Frequency Provider Last Rate Last Dose  . 0.9 %  sodium chloride infusion  250 mL Intravenous PRN Daune Perch, NP      . acetaminophen (TYLENOL) tablet 650 mg  650 mg Oral Q6H PRN Durenda Age, MD   650 mg at 04/05/19 2240  . carvedilol (COREG) tablet 12.5 mg  12.5 mg Oral BID WC Geralynn Rile, MD   12.5 mg at 04/06/19 0755  . enoxaparin (LOVENOX) injection 40 mg  40 mg Subcutaneous Q24H Daune Perch, NP   40 mg at 04/05/19 1816  . furosemide (LASIX) injection 40 mg  40 mg Intravenous Q12H Geralynn Rile, MD   40 mg at 04/06/19 0805  . hydrALAZINE (APRESOLINE) injection 10 mg  10 mg Intravenous Q6H PRN Geralynn Rile, MD      . ondansetron [facility]) injection 4 mg  4 mg Intravenous Q6H PRN Durenda Age, MD   4 mg at 04/04/19 2212  . potassium chloride SA (K-DUR) CR tablet 40 mEq  40 mEq Oral Q4H Geralynn Rile, MD   40 mEq at 04/06/19 0755  . sodium chloride flush (NS) 0.9 % injection 3 mL  3 mL Intravenous PRN Daune Perch, NP         No Known Allergies:  Family History  Problem Relation Age of Onset  . Hypertension Mother    :  Social History   Socioeconomic History  . Marital status: Married    Spouse name: Not on file  . Number of children: Not on file  . Years of education: Not on file  . Highest education level: Not on file  Occupational History  . Not on file  Social Needs  . Financial resource strain: Not on file  . Food insecurity    Worry: Not on file    Inability: Not on file  . Transportation needs    Medical: Not on file    Non-medical: Not on file  Tobacco Use  . Smoking status: Former Smoker    Packs/day: 0.50    Years: 10.00    Pack years: 5.00    Types: Cigarettes    Quit date: 03/20/1979    Years since quitting: 40.0  . Smokeless tobacco: Never Used  . Tobacco comment: pt states she smoked from her early 66s to early 78s.  Substance and Sexual Activity  . Alcohol use: No  . Drug use: No  . Sexual activity: Not on file  Lifestyle  .  Physical activity    Days per week: Not on file    Minutes per session: Not on file  . Stress: Not on file  Relationships  . Social Herbalist on phone: Not on file    Gets together: Not on file    Attends religious service: Not on file    Active member of club or organization: Not on file    Attends meetings of clubs or organizations: Not on file    Relationship status: Not on file  . Intimate partner violence    Fear of current or ex partner: Not on file    Emotionally abused: Not on file    Physically abused: Not on file    Forced sexual activity: Not on file  Other Topics Concern  . Not on file  Social History Narrative  . Not on file  :  Review of Systems: A comprehensive 14 point review of systems was negative except as noted in the HPI.  Exam: Patient Vitals for the past 24 hrs:  BP Temp Temp src Pulse Resp SpO2 Weight  04/06/19 0434 98/66 97.8 F (36.6 C) Oral 75 - 96 % 136 lb 1.6 oz (61.7 kg)  04/05/19 2227 94/66 - - 85 20 96 % -  04/05/19 2130 97/60 - - 87 20 95 % -  04/05/19 2119 - 98.6 F (37 C) Oral -  - - -  04/05/19 2100 92/61 - - 79 - 98 % -  04/05/19 2000 - - - 80 - 96 % -  04/05/19 1816 108/68 - - 88 - - -  04/05/19 1707 101/70 98.1 F (36.7 C) Oral 88 16 97 % -    General:  well-nourished in no acute distress.   Eyes:  no scleral icterus.   ENT:  There were no oropharyngeal lesions.   Neck was without thyromegaly.   Lymphatics:  Negative cervical, supraclavicular or axillary adenopathy.   Respiratory: lungs were clear bilaterally without wheezing or crackles.   Cardiovascular:  Regular rate and rhythm, S1/S2, without murmur, rub or gallop.  1+ pitting edema from the thighs down to her feet.   GI:  abdomen was soft, flat, nontender, nondistended, without organomegaly.   Musculoskeletal:  no spinal tenderness of palpation of vertebral spine.   Skin exam was without echymosis, petichae.   Neuro exam was nonfocal. Patient was alert and oriented.  Attention was good.   Language was appropriate.  Mood was normal without depression.  Speech was not pressured.  Thought content was not tangential.     Lab Results  Component Value Date   WBC 4.5 04/04/2019   HGB 10.2 (L) 04/04/2019   HCT 30.0 (L) 04/04/2019   PLT 247 04/04/2019   GLUCOSE 83 04/06/2019   CHOL 74 04/06/2019   TRIG 72 04/06/2019   HDL 27 (L) 04/06/2019   LDLCALC 33 04/06/2019   ALT 18 04/04/2019   AST 32 04/04/2019   NA 134 (L) 04/06/2019   K 3.3 (L) 04/06/2019   CL 99 04/06/2019   CREATININE 0.83 04/06/2019   BUN 17 04/06/2019   CO2 24 04/06/2019    Ct Chest W Contrast  Result Date: 04/05/2019 CLINICAL DATA:  Weakness, anasarca, dyspnea EXAM: CT CHEST, ABDOMEN, AND PELVIS WITH CONTRAST TECHNIQUE: Multidetector CT imaging of the chest, abdomen and pelvis was performed following the standard protocol during bolus administration of intravenous contrast. CONTRAST:  152m OMNIPAQUE IOHEXOL 300 MG/ML  SOLN COMPARISON:  None. FINDINGS: CT CHEST FINDINGS  Cardiovascular: Mild cardiomegaly. Small to moderate pericardial  effusion. Thoracic aorta is nonaneurysmal. No acute vascular findings. Mediastinum/Nodes: No axillary, mediastinal, or hilar lymphadenopathy. Unremarkable thyroid, trachea, and esophagus. Lungs/Pleura: Small bilateral pleural effusions, left slightly greater than right. Compressive atelectasis in the left lung base. Lungs are otherwise clear. No pneumothorax. Musculoskeletal: Multiple chronic healed left-sided rib fractures. No acute osseous findings. CT ABDOMEN PELVIS FINDINGS Hepatobiliary: No focal liver abnormality is seen. No gallstones, gallbladder wall thickening, or biliary dilatation. Pancreas: Unremarkable. No pancreatic ductal dilatation or surrounding inflammatory changes. Spleen: Normal in size without focal abnormality. Adrenals/Urinary Tract: Adrenal glands are unremarkable. Kidneys are normal, without renal calculi, focal lesion, or hydronephrosis. Bladder is unremarkable. Stomach/Bowel: Stomach is within normal limits. Appendix not clearly visualized. Bowel well opacified with oral contrast. No evidence of bowel wall thickening, distention, or inflammatory changes. Vascular/Lymphatic: No significant vascular findings are present. No enlarged abdominal or pelvic lymph nodes. Reproductive: Uterus and bilateral adnexa are unremarkable. Other: Small fat containing umbilical hernia. No ascites. Mild diffuse anasarca of the abdomen, pelvis and visualized proximal lower extremities. Musculoskeletal: No acute or significant osseous findings. IMPRESSION: 1. Findings of fluid overload including small bilateral pleural effusions, small-to-moderate pericardial effusion, and mild diffuse anasarca of the abdomen and pelvis. 2. No acute abdominopelvic findings. Electronically Signed   By: Davina Poke M.D.   On: 04/05/2019 14:52   Ct Abdomen Pelvis W Contrast  Result Date: 04/05/2019 CLINICAL DATA:  Weakness, anasarca, dyspnea EXAM: CT CHEST, ABDOMEN, AND PELVIS WITH CONTRAST TECHNIQUE: Multidetector CT  imaging of the chest, abdomen and pelvis was performed following the standard protocol during bolus administration of intravenous contrast. CONTRAST:  129m OMNIPAQUE IOHEXOL 300 MG/ML  SOLN COMPARISON:  None. FINDINGS: CT CHEST FINDINGS Cardiovascular: Mild cardiomegaly. Small to moderate pericardial effusion. Thoracic aorta is nonaneurysmal. No acute vascular findings. Mediastinum/Nodes: No axillary, mediastinal, or hilar lymphadenopathy. Unremarkable thyroid, trachea, and esophagus. Lungs/Pleura: Small bilateral pleural effusions, left slightly greater than right. Compressive atelectasis in the left lung base. Lungs are otherwise clear. No pneumothorax. Musculoskeletal: Multiple chronic healed left-sided rib fractures. No acute osseous findings. CT ABDOMEN PELVIS FINDINGS Hepatobiliary: No focal liver abnormality is seen. No gallstones, gallbladder wall thickening, or biliary dilatation. Pancreas: Unremarkable. No pancreatic ductal dilatation or surrounding inflammatory changes. Spleen: Normal in size without focal abnormality. Adrenals/Urinary Tract: Adrenal glands are unremarkable. Kidneys are normal, without renal calculi, focal lesion, or hydronephrosis. Bladder is unremarkable. Stomach/Bowel: Stomach is within normal limits. Appendix not clearly visualized. Bowel well opacified with oral contrast. No evidence of bowel wall thickening, distention, or inflammatory changes. Vascular/Lymphatic: No significant vascular findings are present. No enlarged abdominal or pelvic lymph nodes. Reproductive: Uterus and bilateral adnexa are unremarkable. Other: Small fat containing umbilical hernia. No ascites. Mild diffuse anasarca of the abdomen, pelvis and visualized proximal lower extremities. Musculoskeletal: No acute or significant osseous findings. IMPRESSION: 1. Findings of fluid overload including small bilateral pleural effusions, small-to-moderate pericardial effusion, and mild diffuse anasarca of the abdomen and  pelvis. 2. No acute abdominopelvic findings. Electronically Signed   By: NDavina PokeM.D.   On: 04/05/2019 14:52   Dg Chest Port 1 View  Result Date: 04/05/2019 CLINICAL DATA:  Shortness of breath EXAM: PORTABLE CHEST 1 VIEW COMPARISON:  CT from earlier in the same day. FINDINGS: Progressive left basilar infiltrate is noted with small left pleural effusion. Tiny right pleural effusion is seen. No pneumothorax is noted. Cardiac shadow is enlarged. No bony abnormality is seen. IMPRESSION: Increasing left basilar infiltrate and  effusion. Tiny right effusion. Electronically Signed   By: Inez Catalina M.D.   On: 04/05/2019 21:25   Vas [place] Abi With/wo Tbi  Result Date: 03/24/2019 LOWER EXTREMITY DOPPLER STUDY Indications: Patient reports having blue toes and fingers along with bilateral              lower extremity and foot edema x 2 months. Denies claudication              symptoms but does have aches and pains and cramps in her legs at              nighttime. Poor pulses noted on Physician exam with at least 2+              pitting edema. High Risk Factors: Hypertension, past history of smoking.  Performing Technologist: Mariane Masters RVT  Examination Guidelines: A complete evaluation includes at minimum, Doppler waveform signals and systolic blood pressure reading at the level of bilateral brachial, anterior tibial, and posterior tibial arteries, when vessel segments are accessible. Bilateral testing is considered an integral part of a complete examination. Photoelectric Plethysmograph (PPG) waveforms and toe systolic pressure readings are included as required and additional duplex testing as needed. Limited examinations for reoccurring indications may be performed as noted.  ABI Findings: +---------+------------------+-----+---------+--------+ Right    Rt Pressure (mmHg)IndexWaveform Comment  +---------+------------------+-----+---------+--------+ Brachial 103                                       +---------+------------------+-----+---------+--------+ ATA      109               1.06 triphasic         +---------+------------------+-----+---------+--------+ PTA      139               1.35 triphasic         +---------+------------------+-----+---------+--------+ PERO     127               1.23 biphasic          +---------+------------------+-----+---------+--------+ Great Toe80                0.78                   +---------+------------------+-----+---------+--------+ +---------+------------------+-----+---------+-------+ Left     Lt Pressure (mmHg)IndexWaveform Comment +---------+------------------+-----+---------+-------+ Brachial 96                                      +---------+------------------+-----+---------+-------+ ATA      122               1.18 triphasic        +---------+------------------+-----+---------+-------+ PTA      133               1.29 triphasic        +---------+------------------+-----+---------+-------+ PERO     121               1.17 biphasic         +---------+------------------+-----+---------+-------+ Great Toe59                0.57                  +---------+------------------+-----+---------+-------+ +-------+-----------+-----------+------------+------------+ ABI/TBIToday's ABIToday's TBIPrevious ABIPrevious TBI +-------+-----------+-----------+------------+------------+ Right  1.3  0.78                                +-------+-----------+-----------+------------+------------+ Left   1.29       0.57                                +-------+-----------+-----------+------------+------------+ OES Findings:  Digital PPG tracings are somewhat diminished with borderline pulsatile flow.    Summary: Right: Resting right ankle-brachial index is within normal range. No evidence of significant right lower extremity arterial disease. The right toe-brachial index is normal. Left: Resting left  ankle-brachial index is within normal range. No evidence of significant left lower extremity arterial disease. The left toe-brachial index is abnormal.  *See table(s) above for measurements and observations. See arterial duplex and aorta iliac duplex reports.  Electronically signed by Larae Grooms MD on 03/24/2019 at 5:29:26 PM.    Final    Vas [place] Lower Extremity Arterial Duplex  Result Date: 03/24/2019 LOWER EXTREMITY ARTERIAL DUPLEX STUDY Indications: Patient reports having blue toes and fingers along with bilateral              lower extremity and foot edema x 2 months. Denies claudication              symptoms but does have aches and pains and cramps in her legs at              nighttime. Poor pulses noted on Physician exam with at least 2 +              pitting edema. Evaluate for source of possible emboli. High Risk Factors: Hypertension, past history of smoking.  Current ABI: Today's ABIs are 1.3 on the right and 1.29 on the left. Performing Technologist: Mariane Masters RVT  Examination Guidelines: A complete evaluation includes B-mode imaging, spectral Doppler, color Doppler, and power Doppler as needed of all accessible portions of each vessel. Bilateral testing is considered an integral part of a complete examination. Limited examinations for reoccurring indications may be performed as noted.  +-----------+--------+-----+--------+---------+--------+ RIGHT      PSV cm/sRatioStenosisWaveform Comments +-----------+--------+-----+--------+---------+--------+ CFA Prox   106                  triphasic         +-----------+--------+-----+--------+---------+--------+ DFA        52                   triphasic         +-----------+--------+-----+--------+---------+--------+ SFA Prox   69                   triphasic         +-----------+--------+-----+--------+---------+--------+ SFA Mid    74                   triphasic          +-----------+--------+-----+--------+---------+--------+ SFA Distal 87                   triphasic         +-----------+--------+-----+--------+---------+--------+ POP Prox   69                   triphasic         +-----------+--------+-----+--------+---------+--------+ POP Distal 71  triphasic         +-----------+--------+-----+--------+---------+--------+ TP Trunk   67                   triphasic         +-----------+--------+-----+--------+---------+--------+ ATA Distal 61                   triphasic         +-----------+--------+-----+--------+---------+--------+ PTA Distal 75                   triphasic         +-----------+--------+-----+--------+---------+--------+ PERO Distal50                   triphasic         +-----------+--------+-----+--------+---------+--------+  +-----------+--------+-----+--------+---------+--------+ LEFT       PSV cm/sRatioStenosisWaveform Comments +-----------+--------+-----+--------+---------+--------+ CFA Prox   108                  triphasic         +-----------+--------+-----+--------+---------+--------+ DFA        59                   triphasic         +-----------+--------+-----+--------+---------+--------+ SFA Prox   90                   triphasic         +-----------+--------+-----+--------+---------+--------+ SFA Mid    88                   triphasic         +-----------+--------+-----+--------+---------+--------+ SFA Distal 106                  triphasic         +-----------+--------+-----+--------+---------+--------+ POP Prox   71                   triphasic         +-----------+--------+-----+--------+---------+--------+ POP Distal 56                   triphasic         +-----------+--------+-----+--------+---------+--------+ TP Trunk   71                   triphasic         +-----------+--------+-----+--------+---------+--------+ ATA  Distal 54                   triphasic         +-----------+--------+-----+--------+---------+--------+ PTA Distal 78                   triphasic         +-----------+--------+-----+--------+---------+--------+ PERO Distal45                   triphasic         +-----------+--------+-----+--------+---------+--------+  Summary: Right: Normal examination. No evidence of arterial occlusive disease. Left: Normal examination. No evidence of arterial occlusive disease.  See table(s) above for measurements and observations. See ABI and aorta-iliac duplex reports. Electronically signed by Larae Grooms MD on 03/24/2019 at 5:35:23 PM.    Final    Vas [place] Lower Extremity Venous (dvt)  Result Date: 04/05/2019  Lower Venous Study Indications: Edema.  Comparison Study: no prior Performing Technologist: Abram Sander RVS  Examination Guidelines: A complete evaluation includes B-mode imaging, spectral Doppler, color Doppler, and power Doppler as needed  of all accessible portions of each vessel. Bilateral testing is considered an integral part of a complete examination. Limited examinations for reoccurring indications may be performed as noted.  +---------+---------------+---------+-----------+----------+--------------+ RIGHT    CompressibilityPhasicitySpontaneityPropertiesThrombus Aging +---------+---------------+---------+-----------+----------+--------------+ CFV      Full           Yes      Yes                                 +---------+---------------+---------+-----------+----------+--------------+ SFJ      Full                                                        +---------+---------------+---------+-----------+----------+--------------+ FV Prox  Full                                                        +---------+---------------+---------+-----------+----------+--------------+ FV Mid   Full                                                         +---------+---------------+---------+-----------+----------+--------------+ FV DistalFull                                                        +---------+---------------+---------+-----------+----------+--------------+ PFV      Full                                                        +---------+---------------+---------+-----------+----------+--------------+ POP      Full           Yes      Yes                                 +---------+---------------+---------+-----------+----------+--------------+ PTV      Full                                                        +---------+---------------+---------+-----------+----------+--------------+ PERO                                                  Not visualized +---------+---------------+---------+-----------+----------+--------------+   +---------+---------------+---------+-----------+----------+--------------+ LEFT     CompressibilityPhasicitySpontaneityPropertiesThrombus Aging +---------+---------------+---------+-----------+----------+--------------+ CFV      Full  Yes      Yes                                 +---------+---------------+---------+-----------+----------+--------------+ SFJ      Full                                                        +---------+---------------+---------+-----------+----------+--------------+ FV Prox  Full                                                        +---------+---------------+---------+-----------+----------+--------------+ FV Mid   Full                                                        +---------+---------------+---------+-----------+----------+--------------+ FV DistalFull                                                        +---------+---------------+---------+-----------+----------+--------------+ PFV      Full                                                         +---------+---------------+---------+-----------+----------+--------------+ POP      Full           Yes      Yes                                 +---------+---------------+---------+-----------+----------+--------------+ PTV      Full                                                        +---------+---------------+---------+-----------+----------+--------------+ PERO                                                  Not visualized +---------+---------------+---------+-----------+----------+--------------+     Summary: Right: There is no evidence of deep vein thrombosis in the lower extremity. No cystic structure found in the popliteal fossa. Left: There is no evidence of deep vein thrombosis in the lower extremity. No cystic structure found in the popliteal fossa.  *See table(s) above for measurements and observations. Electronically signed by Harold Barban MD on 04/05/2019 at 4:31:10 PM.    Final    Vas US Aorta/ivc/iliacs  Result Date: 03/24/2019 ABDOMINAL  AORTA STUDY Indications: Patient reports having blue toes and fingers along with bilateral              lower extremity and foot edema x 2 months. Denies claudication              symptoms but does have aches and pains and cramps in her legs at              nighttime. Poor pulses noted on Physician exam with at least 2 +              pitting edema. Evaluate for source of possible emboli. Risk Factors: Hypertension, past history of smoking. Limitations: Air/bowel gas.  Performing Technologist: Mariane Masters RVT  Examination Guidelines: A complete evaluation includes B-mode imaging, spectral Doppler, color Doppler, and power Doppler as needed of all accessible portions of each vessel. Bilateral testing is considered an integral part of a complete examination. Limited examinations for reoccurring indications may be performed as noted.  Abdominal Aorta Findings: +-------------+-------+----------+----------+---------+--------+--------+  Location     AP (cm)Trans (cm)PSV (cm/s)Waveform ThrombusComments +-------------+-------+----------+----------+---------+--------+--------+ Proximal     2.50   2.50      82        biphasic                  +-------------+-------+----------+----------+---------+--------+--------+ Mid          1.80   1.80      52        triphasic                 +-------------+-------+----------+----------+---------+--------+--------+ Distal       1.60   1.60      56        triphasic                 +-------------+-------+----------+----------+---------+--------+--------+ RT CIA Prox  1.5    1.5       68        triphasic                 +-------------+-------+----------+----------+---------+--------+--------+ RT CIA Mid                    58        triphasic                 +-------------+-------+----------+----------+---------+--------+--------+ RT CIA Distal                 56        biphasic                  +-------------+-------+----------+----------+---------+--------+--------+ RT EIA Prox                   144       triphasic                 +-------------+-------+----------+----------+---------+--------+--------+ RT EIA Mid                    83        triphasic                 +-------------+-------+----------+----------+---------+--------+--------+ RT EIA Distal                 109       triphasic                 +-------------+-------+----------+----------+---------+--------+--------+ LT CIA Prox  1.1    1.2  91        triphasic                 +-------------+-------+----------+----------+---------+--------+--------+ LT CIA Mid                    81        biphasic                  +-------------+-------+----------+----------+---------+--------+--------+ LT CIA Distal                 138       triphasic                 +-------------+-------+----------+----------+---------+--------+--------+ LT EIA Prox                    92        triphasic                 +-------------+-------+----------+----------+---------+--------+--------+ LT EIA Mid                    144       triphasic                 +-------------+-------+----------+----------+---------+--------+--------+ LT EIA Distal                 93        triphasic                 +-------------+-------+----------+----------+---------+--------+--------+ IVC/Iliac Findings: +--------+------+--------+--------+   IVC   PatentThrombusComments +--------+------+--------+--------+ IVC Proxpatent                 +--------+------+--------+--------+   Summary: Abdominal Aorta: No evidence of an abdominal aortic aneurysm was visualized. The largest aortic measurement is 2.5 cm. The right common iliac artery is mildly ectatic measuring 1.5 cm. Stenosis: No evidence of stenosis in the aorta or bilateral iliac arteries.  *See table(s) above for measurements and observations. See ABI and arterial duplex reports.  Electronically signed by Larae Grooms MD on 03/24/2019 at 5:33:51 PM.    Final      Ct Chest W Contrast  Result Date: 04/05/2019 CLINICAL DATA:  Weakness, anasarca, dyspnea EXAM: CT CHEST, ABDOMEN, AND PELVIS WITH CONTRAST TECHNIQUE: Multidetector CT imaging of the chest, abdomen and pelvis was performed following the standard protocol during bolus administration of intravenous contrast. CONTRAST:  160m OMNIPAQUE IOHEXOL 300 MG/ML  SOLN COMPARISON:  None. FINDINGS: CT CHEST FINDINGS Cardiovascular: Mild cardiomegaly. Small to moderate pericardial effusion. Thoracic aorta is nonaneurysmal. No acute vascular findings. Mediastinum/Nodes: No axillary, mediastinal, or hilar lymphadenopathy. Unremarkable thyroid, trachea, and esophagus. Lungs/Pleura: Small bilateral pleural effusions, left slightly greater than right. Compressive atelectasis in the left lung base. Lungs are otherwise clear. No pneumothorax. Musculoskeletal: Multiple chronic healed  left-sided rib fractures. No acute osseous findings. CT ABDOMEN PELVIS FINDINGS Hepatobiliary: No focal liver abnormality is seen. No gallstones, gallbladder wall thickening, or biliary dilatation. Pancreas: Unremarkable. No pancreatic ductal dilatation or surrounding inflammatory changes. Spleen: Normal in size without focal abnormality. Adrenals/Urinary Tract: Adrenal glands are unremarkable. Kidneys are normal, without renal calculi, focal lesion, or hydronephrosis. Bladder is unremarkable. Stomach/Bowel: Stomach is within normal limits. Appendix not clearly visualized. Bowel well opacified with oral contrast. No evidence of bowel wall thickening, distention, or inflammatory changes. Vascular/Lymphatic: No significant vascular findings are present. No enlarged abdominal or pelvic lymph nodes. Reproductive: Uterus and bilateral adnexa are unremarkable. Other: Small fat containing umbilical hernia. No ascites.  Mild diffuse anasarca of the abdomen, pelvis and visualized proximal lower extremities. Musculoskeletal: No acute or significant osseous findings. IMPRESSION: 1. Findings of fluid overload including small bilateral pleural effusions, small-to-moderate pericardial effusion, and mild diffuse anasarca of the abdomen and pelvis. 2. No acute abdominopelvic findings. Electronically Signed   By: Davina Poke M.D.   On: 04/05/2019 14:52   Ct Abdomen Pelvis W Contrast  Result Date: 04/05/2019 CLINICAL DATA:  Weakness, anasarca, dyspnea EXAM: CT CHEST, ABDOMEN, AND PELVIS WITH CONTRAST TECHNIQUE: Multidetector CT imaging of the chest, abdomen and pelvis was performed following the standard protocol during bolus administration of intravenous contrast. CONTRAST:  151m OMNIPAQUE IOHEXOL 300 MG/ML  SOLN COMPARISON:  None. FINDINGS: CT CHEST FINDINGS Cardiovascular: Mild cardiomegaly. Small to moderate pericardial effusion. Thoracic aorta is nonaneurysmal. No acute vascular findings. Mediastinum/Nodes: No axillary,  mediastinal, or hilar lymphadenopathy. Unremarkable thyroid, trachea, and esophagus. Lungs/Pleura: Small bilateral pleural effusions, left slightly greater than right. Compressive atelectasis in the left lung base. Lungs are otherwise clear. No pneumothorax. Musculoskeletal: Multiple chronic healed left-sided rib fractures. No acute osseous findings. CT ABDOMEN PELVIS FINDINGS Hepatobiliary: No focal liver abnormality is seen. No gallstones, gallbladder wall thickening, or biliary dilatation. Pancreas: Unremarkable. No pancreatic ductal dilatation or surrounding inflammatory changes. Spleen: Normal in size without focal abnormality. Adrenals/Urinary Tract: Adrenal glands are unremarkable. Kidneys are normal, without renal calculi, focal lesion, or hydronephrosis. Bladder is unremarkable. Stomach/Bowel: Stomach is within normal limits. Appendix not clearly visualized. Bowel well opacified with oral contrast. No evidence of bowel wall thickening, distention, or inflammatory changes. Vascular/Lymphatic: No significant vascular findings are present. No enlarged abdominal or pelvic lymph nodes. Reproductive: Uterus and bilateral adnexa are unremarkable. Other: Small fat containing umbilical hernia. No ascites. Mild diffuse anasarca of the abdomen, pelvis and visualized proximal lower extremities. Musculoskeletal: No acute or significant osseous findings. IMPRESSION: 1. Findings of fluid overload including small bilateral pleural effusions, small-to-moderate pericardial effusion, and mild diffuse anasarca of the abdomen and pelvis. 2. No acute abdominopelvic findings. Electronically Signed   By: NDavina PokeM.D.   On: 04/05/2019 14:52   Dg Chest Port 1 View  Result Date: 04/05/2019 CLINICAL DATA:  Shortness of breath EXAM: PORTABLE CHEST 1 VIEW COMPARISON:  CT from earlier in the same day. FINDINGS: Progressive left basilar infiltrate is noted with small left pleural effusion. Tiny right pleural effusion is seen.  No pneumothorax is noted. Cardiac shadow is enlarged. No bony abnormality is seen. IMPRESSION: Increasing left basilar infiltrate and effusion. Tiny right effusion. Electronically Signed   By: MInez CatalinaM.D.   On: 04/05/2019 21:25   Vas [place] With/wo Tbi  Result Date: 03/24/2019 LOWER EXTREMITY DOPPLER STUDY Indications: Patient reports having blue toes and fingers along with bilateral              lower extremity and foot edema x 2 months. Denies claudication              symptoms but does have aches and pains and cramps in her legs at              nighttime. Poor pulses noted on Physician exam with at least 2+              pitting edema. High Risk Factors: Hypertension, past history of smoking.  Performing Technologist: DMariane MastersRVT  Examination Guidelines: A complete evaluation includes at minimum, Doppler waveform signals and systolic blood pressure reading at the level of bilateral brachial, anterior tibial, and posterior  tibial arteries, when vessel segments are accessible. Bilateral testing is considered an integral part of a complete examination. Photoelectric Plethysmograph (PPG) waveforms and toe systolic pressure readings are included as required and additional duplex testing as needed. Limited examinations for reoccurring indications may be performed as noted.  ABI Findings: +---------+------------------+-----+---------+--------+ Right    Rt Pressure (mmHg)IndexWaveform Comment  +---------+------------------+-----+---------+--------+ Brachial 103                                      +---------+------------------+-----+---------+--------+ ATA      109               1.06 triphasic         +---------+------------------+-----+---------+--------+ PTA      139               1.35 triphasic         +---------+------------------+-----+---------+--------+ PERO     127               1.23 biphasic          +---------+------------------+-----+---------+--------+ Great  Toe80                0.78                   +---------+------------------+-----+---------+--------+ +---------+------------------+-----+---------+-------+ Left     Lt Pressure (mmHg)IndexWaveform Comment +---------+------------------+-----+---------+-------+ Brachial 96                                      +---------+------------------+-----+---------+-------+ ATA      122               1.18 triphasic        +---------+------------------+-----+---------+-------+ PTA      133               1.29 triphasic        +---------+------------------+-----+---------+-------+ PERO     121               1.17 biphasic         +---------+------------------+-----+---------+-------+ Great Toe59                0.57                  +---------+------------------+-----+---------+-------+ +-------+-----------+-----------+------------+------------+ ABI/TBIToday's ABIToday's TBIPrevious ABIPrevious TBI +-------+-----------+-----------+------------+------------+ Right  1.3        0.78                                +-------+-----------+-----------+------------+------------+ Left   1.29       0.57                                +-------+-----------+-----------+------------+------------+ OES Findings:  Digital PPG tracings are somewhat diminished with borderline pulsatile flow.    Summary: Right: Resting right ankle-brachial index is within normal range. No evidence of significant right lower extremity arterial disease. The right toe-brachial index is normal. Left: Resting left ankle-brachial index is within normal range. No evidence of significant left lower extremity arterial disease. The left toe-brachial index is abnormal.  *See table(s) above for measurements and observations. See arterial duplex and aorta iliac duplex reports.  Electronically signed by Larae Grooms MD on 03/24/2019 at 5:29:26  PM.    Final    Vas [place] Lower Extremity Arterial Duplex  Result Date:  03/24/2019 LOWER EXTREMITY ARTERIAL DUPLEX STUDY Indications: Patient reports having blue toes and fingers along with bilateral              lower extremity and foot edema x 2 months. Denies claudication              symptoms but does have aches and pains and cramps in her legs at              nighttime. Poor pulses noted on Physician exam with at least 2 +              pitting edema. Evaluate for source of possible emboli. High Risk Factors: Hypertension, past history of smoking.  Current ABI: Today's ABIs are 1.3 on the right and 1.29 on the left. Performing Technologist: Mariane Masters RVT  Examination Guidelines: A complete evaluation includes B-mode imaging, spectral Doppler, color Doppler, and power Doppler as needed of all accessible portions of each vessel. Bilateral testing is considered an integral part of a complete examination. Limited examinations for reoccurring indications may be performed as noted.  +-----------+--------+-----+--------+---------+--------+ RIGHT      PSV cm/sRatioStenosisWaveform Comments +-----------+--------+-----+--------+---------+--------+ CFA Prox   106                  triphasic         +-----------+--------+-----+--------+---------+--------+ DFA        52                   triphasic         +-----------+--------+-----+--------+---------+--------+ SFA Prox   69                   triphasic         +-----------+--------+-----+--------+---------+--------+ SFA Mid    74                   triphasic         +-----------+--------+-----+--------+---------+--------+ SFA Distal 87                   triphasic         +-----------+--------+-----+--------+---------+--------+ POP Prox   69                   triphasic         +-----------+--------+-----+--------+---------+--------+ POP Distal 71                   triphasic         +-----------+--------+-----+--------+---------+--------+ TP Trunk   67                   triphasic          +-----------+--------+-----+--------+---------+--------+ ATA Distal 61                   triphasic         +-----------+--------+-----+--------+---------+--------+ PTA Distal 75                   triphasic         +-----------+--------+-----+--------+---------+--------+ PERO Distal50                   triphasic         +-----------+--------+-----+--------+---------+--------+  +-----------+--------+-----+--------+---------+--------+ LEFT       PSV cm/sRatioStenosisWaveform Comments +-----------+--------+-----+--------+---------+--------+ CFA Prox   108  triphasic         +-----------+--------+-----+--------+---------+--------+ DFA        59                   triphasic         +-----------+--------+-----+--------+---------+--------+ SFA Prox   90                   triphasic         +-----------+--------+-----+--------+---------+--------+ SFA Mid    88                   triphasic         +-----------+--------+-----+--------+---------+--------+ SFA Distal 106                  triphasic         +-----------+--------+-----+--------+---------+--------+ POP Prox   71                   triphasic         +-----------+--------+-----+--------+---------+--------+ POP Distal 56                   triphasic         +-----------+--------+-----+--------+---------+--------+ TP Trunk   71                   triphasic         +-----------+--------+-----+--------+---------+--------+ ATA Distal 54                   triphasic         +-----------+--------+-----+--------+---------+--------+ PTA Distal 78                   triphasic         +-----------+--------+-----+--------+---------+--------+ PERO Distal45                   triphasic         +-----------+--------+-----+--------+---------+--------+  Summary: Right: Normal examination. No evidence of arterial occlusive disease. Left: Normal examination. No evidence  of arterial occlusive disease.  See table(s) above for measurements and observations. See ABI and aorta-iliac duplex reports. Electronically signed by Larae Grooms MD on 03/24/2019 at 5:35:23 PM.    Final    Vas [place] Lower Extremity Venous (dvt)  Result Date: 04/05/2019  Lower Venous Study Indications: Edema.  Comparison Study: no prior Performing Technologist: Abram Sander RVS  Examination Guidelines: A complete evaluation includes B-mode imaging, spectral Doppler, color Doppler, and power Doppler as needed of all accessible portions of each vessel. Bilateral testing is considered an integral part of a complete examination. Limited examinations for reoccurring indications may be performed as noted.  +---------+---------------+---------+-----------+----------+--------------+ RIGHT    CompressibilityPhasicitySpontaneityPropertiesThrombus Aging +---------+---------------+---------+-----------+----------+--------------+ CFV      Full           Yes      Yes                                 +---------+---------------+---------+-----------+----------+--------------+ SFJ      Full                                                        +---------+---------------+---------+-----------+----------+--------------+ FV Prox  Full                                                        +---------+---------------+---------+-----------+----------+--------------+  FV Mid   Full                                                        +---------+---------------+---------+-----------+----------+--------------+ FV DistalFull                                                        +---------+---------------+---------+-----------+----------+--------------+ PFV      Full                                                        +---------+---------------+---------+-----------+----------+--------------+ POP      Full           Yes      Yes                                  +---------+---------------+---------+-----------+----------+--------------+ PTV      Full                                                        +---------+---------------+---------+-----------+----------+--------------+ PERO                                                  Not visualized +---------+---------------+---------+-----------+----------+--------------+   +---------+---------------+---------+-----------+----------+--------------+ LEFT     CompressibilityPhasicitySpontaneityPropertiesThrombus Aging +---------+---------------+---------+-----------+----------+--------------+ CFV      Full           Yes      Yes                                 +---------+---------------+---------+-----------+----------+--------------+ SFJ      Full                                                        +---------+---------------+---------+-----------+----------+--------------+ FV Prox  Full                                                        +---------+---------------+---------+-----------+----------+--------------+ FV Mid   Full                                                        +---------+---------------+---------+-----------+----------+--------------+  FV DistalFull                                                        +---------+---------------+---------+-----------+----------+--------------+ PFV      Full                                                        +---------+---------------+---------+-----------+----------+--------------+ POP      Full           Yes      Yes                                 +---------+---------------+---------+-----------+----------+--------------+ PTV      Full                                                        +---------+---------------+---------+-----------+----------+--------------+ PERO                                                  Not visualized  +---------+---------------+---------+-----------+----------+--------------+     Summary: Right: There is no evidence of deep vein thrombosis in the lower extremity. No cystic structure found in the popliteal fossa. Left: There is no evidence of deep vein thrombosis in the lower extremity. No cystic structure found in the popliteal fossa.  *See table(s) above for measurements and observations. Electronically signed by Harold Barban MD on 04/05/2019 at 4:31:10 PM.    Final    Vas US Aorta/ivc/iliacs  Result Date: 03/24/2019 ABDOMINAL AORTA STUDY Indications: Patient reports having blue toes and fingers along with bilateral              lower extremity and foot edema x 2 months. Denies claudication              symptoms but does have aches and pains and cramps in her legs at              nighttime. Poor pulses noted on Physician exam with at least 2 +              pitting edema. Evaluate for source of possible emboli. Risk Factors: Hypertension, past history of smoking. Limitations: Air/bowel gas.  Performing Technologist: Mariane Masters RVT  Examination Guidelines: A complete evaluation includes B-mode imaging, spectral Doppler, color Doppler, and power Doppler as needed of all accessible portions of each vessel. Bilateral testing is considered an integral part of a complete examination. Limited examinations for reoccurring indications may be performed as noted.  Abdominal Aorta Findings: +-------------+-------+----------+----------+---------+--------+--------+ Location     AP (cm)Trans (cm)PSV (cm/s)Waveform ThrombusComments +-------------+-------+----------+----------+---------+--------+--------+ Proximal     2.50   2.50      82        biphasic                  +-------------+-------+----------+----------+---------+--------+--------+  Mid          1.80   1.80      52        triphasic                 +-------------+-------+----------+----------+---------+--------+--------+ Distal        1.60   1.60      56        triphasic                 +-------------+-------+----------+----------+---------+--------+--------+ RT CIA Prox  1.5    1.5       68        triphasic                 +-------------+-------+----------+----------+---------+--------+--------+ RT CIA Mid                    58        triphasic                 +-------------+-------+----------+----------+---------+--------+--------+ RT CIA Distal                 56        biphasic                  +-------------+-------+----------+----------+---------+--------+--------+ RT EIA Prox                   144       triphasic                 +-------------+-------+----------+----------+---------+--------+--------+ RT EIA Mid                    83        triphasic                 +-------------+-------+----------+----------+---------+--------+--------+ RT EIA Distal                 109       triphasic                 +-------------+-------+----------+----------+---------+--------+--------+ LT CIA Prox  1.1    1.2       91        triphasic                 +-------------+-------+----------+----------+---------+--------+--------+ LT CIA Mid                    81        biphasic                  +-------------+-------+----------+----------+---------+--------+--------+ LT CIA Distal                 138       triphasic                 +-------------+-------+----------+----------+---------+--------+--------+ LT EIA Prox                   92        triphasic                 +-------------+-------+----------+----------+---------+--------+--------+ LT EIA Mid                    144       triphasic                 +-------------+-------+----------+----------+---------+--------+--------+ LT EIA Distal  93        triphasic                 +-------------+-------+----------+----------+---------+--------+--------+ IVC/Iliac Findings:  +--------+------+--------+--------+   IVC   PatentThrombusComments +--------+------+--------+--------+ IVC Proxpatent                 +--------+------+--------+--------+   Summary: Abdominal Aorta: No evidence of an abdominal aortic aneurysm was visualized. The largest aortic measurement is 2.5 cm. The right common iliac artery is mildly ectatic measuring 1.5 cm. Stenosis: No evidence of stenosis in the aorta or bilateral iliac arteries.  *See table(s) above for measurements and observations. See ABI and arterial duplex reports.  Electronically signed by Larae Grooms MD on 03/24/2019 at 5:33:51 PM.    Final     Assessment and Plan:  This is a 68 year old female with  1.  Elevated M spike -The patient had a copy of her outside labs from the rheumatologist which showed an M spike of 0.1.  However, repeat testing this admission with no M spike observed. -CT of the chest, abdomen, pelvis did not show any evidence of widespread bone lesions.  She has multiple chronic healed left-sided rib fractures. ?  Need for bone scan  2.  Anasarca -Cardiac work-up has been inconsistent so far -Noted to have a low albumin and total protein as well as a low total cholesterol -We will defer to Dr. Irene Limbo regarding further work-up for amyloid  3.  Pericardial effusion -Management per cardiology   Thank you for this referral.   Mikey Bussing, DNP, AGPCNP-BC, AOCNP   ADDENDUM  .Patient was Personally and independently interviewed, examined and relevant elements of the history of present illness were reviewed in details and an assessment and plan was created. All elements of the patient's history of present illness , assessment and plan were discussed in details with Mikey Bussing, DNP. The above documentation reflects our combined findings assessment and plan.  Consulted to r/o AL Amyloidosis as an etiology for patients anasarca. No GI symptoms/diarrhea or abdominal pain SPEP with no M  spike Serum K/L free light chains -pending 24h UPEP -pending. Patient has had a rheumatology evaluation which showed no concerns. 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 ? Acute alcoholic hepatitis which can cause her presentation of anasarca. Notes she is eating well. No focal symptoms suggestive of and focal malignancy. Responding well to diuretics. PLAN -optimize dietary therapies -diuresis per cardiology. -absolute ETOH cessation -f/y SFLC and 24h UPEP to determine if there is any clonality to K/L FLC's -discussed with Dr OAudie Box-- ECHO not classical for cardiac amyloidosis. -she had CRP elevations whch has improved ? Etiology -patient had several questions which were answered in details  GSullivan LoneMD MS

## 2019-04-06 NOTE — Plan of Care (Signed)
°  Problem: Education: °Goal: Knowledge of General Education information will improve °Description: Including pain rating scale, medication(s)/side effects and non-pharmacologic comfort measures °Outcome: Progressing °  °Problem: Clinical Measurements: °Goal: Ability to maintain clinical measurements within normal limits will improve °Outcome: Progressing °  °Problem: Activity: °Goal: Risk for activity intolerance will decrease °Outcome: Progressing °  °Problem: Nutrition: °Goal: Adequate nutrition will be maintained °Outcome: Progressing °  °Problem: Coping: °Goal: Level of anxiety will decrease °Outcome: Progressing °  °Problem: Pain Managment: °Goal: General experience of comfort will improve °Outcome: Progressing °  °

## 2019-04-07 ENCOUNTER — Inpatient Hospital Stay (HOSPITAL_COMMUNITY): Payer: Medicare Other

## 2019-04-07 DIAGNOSIS — E8809 Other disorders of plasma-protein metabolism, not elsewhere classified: Principal | ICD-10-CM

## 2019-04-07 DIAGNOSIS — I313 Pericardial effusion (noninflammatory): Secondary | ICD-10-CM

## 2019-04-07 DIAGNOSIS — I10 Essential (primary) hypertension: Secondary | ICD-10-CM

## 2019-04-07 DIAGNOSIS — I3139 Other pericardial effusion (noninflammatory): Secondary | ICD-10-CM

## 2019-04-07 DIAGNOSIS — R609 Edema, unspecified: Secondary | ICD-10-CM

## 2019-04-07 DIAGNOSIS — D472 Monoclonal gammopathy: Secondary | ICD-10-CM

## 2019-04-07 DIAGNOSIS — G4700 Insomnia, unspecified: Secondary | ICD-10-CM

## 2019-04-07 LAB — UPEP/UIFE/LIGHT CHAINS/TP, 24-HR UR
% BETA, Urine: 34.2 %
ALPHA 1 URINE: 7.8 %
Albumin, U: 20.4 %
Alpha 2, Urine: 22.5 %
Free Kappa Lt Chains,Ur: 29.37 mg/L (ref 0.63–113.79)
Free Kappa/Lambda Ratio: 9.44 (ref 1.03–31.76)
Free Lambda Lt Chains,Ur: 3.11 mg/L (ref 0.47–11.77)
GAMMA GLOBULIN URINE: 15.1 %
Total Protein, Urine-Ur/day: 169 mg/24 hr — ABNORMAL HIGH (ref 30–150)
Total Protein, Urine: 12.1 mg/dL
Total Volume: 1400

## 2019-04-07 LAB — FECAL FAT, QUALITATIVE
Fat Qual Neutral, Stl: NORMAL
Fat Qual Total, Stl: NORMAL

## 2019-04-07 LAB — PROTIME-INR
INR: 1.2 (ref 0.8–1.2)
Prothrombin Time: 14.6 seconds (ref 11.4–15.2)

## 2019-04-07 LAB — BASIC METABOLIC PANEL
Anion gap: 11 (ref 5–15)
BUN: 21 mg/dL (ref 8–23)
CO2: 24 mmol/L (ref 22–32)
Calcium: 8.4 mg/dL — ABNORMAL LOW (ref 8.9–10.3)
Chloride: 97 mmol/L — ABNORMAL LOW (ref 98–111)
Creatinine, Ser: 0.89 mg/dL (ref 0.44–1.00)
GFR calc Af Amer: >60 mL/min (ref >60)
GFR calc non Af Amer: >60 mL/min (ref >60)
Glucose, Bld: 108 mg/dL — ABNORMAL HIGH (ref 70–99)
Potassium: 4 mmol/L (ref 3.5–5.1)
Sodium: 132 mmol/L — ABNORMAL LOW (ref 135–145)

## 2019-04-07 LAB — TSH: TSH: 4.565 u[IU]/mL — ABNORMAL HIGH (ref 0.350–4.500)

## 2019-04-07 LAB — FIBRINOGEN: Fibrinogen: 383 mg/dL (ref 210–475)

## 2019-04-07 LAB — MAGNESIUM: Magnesium: 2 mg/dL (ref 1.7–2.4)

## 2019-04-07 LAB — IGG, IGA, IGM
IgA: 97 mg/dL (ref 87–352)
IgG (Immunoglobin G), Serum: 706 mg/dL (ref 586–1602)
IgM (Immunoglobulin M), Srm: 52 mg/dL (ref 26–217)

## 2019-04-07 LAB — MPO/PR-3 (ANCA) ANTIBODIES
ANCA Proteinase 3: <3.5 U/mL (ref 0.0–3.5)
Myeloperoxidase Abs: <9 U/mL (ref 0.0–9.0)

## 2019-04-07 LAB — T4, FREE: Free T4: 1.37 ng/dL — ABNORMAL HIGH (ref 0.61–1.12)

## 2019-04-07 MED ORDER — CARVEDILOL 12.5 MG PO TABS: 12.5000 mg | ORAL_TABLET | Freq: Two times a day (BID) | ORAL | 3 refills | Status: DC

## 2019-04-07 MED ORDER — SODIUM CHLORIDE 0.9% FLUSH
3.0000 mL | Freq: Two times a day (BID) | INTRAVENOUS | Status: DC
  Administered 2019-04-07: 10:00:00 3 mL via INTRAVENOUS

## 2019-04-07 MED ORDER — LIDOCAINE 5 % EX PTCH
1.0000 | MEDICATED_PATCH | Freq: Every day | CUTANEOUS | Status: DC
  Administered 2019-04-07: 1 via TRANSDERMAL
  Filled 2019-04-07: qty 1

## 2019-04-07 MED ORDER — TRAZODONE HCL 50 MG PO TABS: 50.0000 mg | ORAL_TABLET | Freq: Every evening | ORAL | 0 refills | Status: DC | PRN

## 2019-04-07 MED ORDER — FUROSEMIDE 40 MG PO TABS: 40.0000 mg | ORAL_TABLET | Freq: Two times a day (BID) | ORAL | 2 refills | Status: DC

## 2019-04-07 MED FILL — CARVEDILOL 12.5 MG TABLET: 12.5 | 90 days supply | Qty: 180 | Fill #0

## 2019-04-07 MED FILL — traZODone HCL 50 MG TABS: 50 | 30 days supply | Qty: 30 | Fill #0

## 2019-04-07 NOTE — Care Management Important Message (Signed)
Important Message  Patient Details  Name: Joanna Ali MRN: [id number] Date of Birth: 09/29/1950   Medicare Important Message Given:  Yes     Shelda Altes 04/07/2019, 1:22 PM

## 2019-04-07 NOTE — Discharge Summary (Addendum)
Discharge Summary    Patient ID: Joanna Ali MRN: [id number]; DOB: [birth year]  Admit date: 04/04/2019 Discharge date: 04/07/2019  Primary Care Provider: Octavia Bruckner, MD  Primary Cardiologist: Shelva Majestic, MD  Primary Electrophysiologist:  None   Discharge Diagnoses    Principal Problem:   Anasarca Active Problems:   Monoclonal paraproteinemia   Pericardial effusion   Hypertension   Insomnia   Allergies No Known Allergies  Diagnostic Studies/Procedures    Echocardiogram with bubble study 01/03/2019 IMPRESSIONS  1. Left ventricular ejection fraction, by visual estimation, is 60 to 65%. The left ventricle has normal function. Normal left ventricular size. Left ventricular septal wall thickness was normal. Normal left ventricular posterior wall thickness. There  is no left ventricular hypertrophy.  2. Left ventricular diastolic Doppler parameters are consistent with pseudonormalization pattern of LV diastolic filling.  3. Global right ventricle has normal systolic function.The right ventricular size is normal. No increase in right ventricular wall thickness.  4. Left atrial size was mildly dilated.  5. Right atrial size was normal.  6. Large pericardial effusion.  7. The pericardial effusion is circumferential.  8. There is inversion of the right atrial wall, excessive respiratory variation in the tricuspid valve spectral Doppler velocities and excessive respiratory variation in the mitral valve spectral Doppler velocities.  9. Pericardial effusion measuring up to 1.36 cm. Findings concerning for early tamponade physiology. 10. The mitral valve is normal in structure. Mild mitral valve regurgitation. No evidence of mitral stenosis. 11. The tricuspid valve is normal in structure. Tricuspid valve regurgitation is mild. 12. The aortic valve The aortic valve is tricuspid Aortic valve regurgitation is moderate by color flow Doppler. Structurally normal aortic valve, with no evidence  of sclerosis or stenosis. 13. The pulmonic valve was normal in structure. Pulmonic valve regurgitation is trivial by color flow Doppler. 14. Moderately elevated pulmonary artery systolic pressure. 15. The inferior vena cava is dilated in size with >50% respiratory variability, suggesting right atrial pressure of 8 mmHg. 16. Small patent foramen ovale with predominantly right to left shunting across the atrial septum.  FINDINGS  Left Ventricle: Left ventricular ejection fraction, by visual estimation, is 60 to 65%. The left ventricle has normal function. Left ventricular septal wall thickness was normal. Normal left ventricular posterior wall thickness. There is no left  ventricular hypertrophy. Normal left ventricular size. Spectral Doppler shows Left ventricular diastolic Doppler parameters are consistent with pseudonormalization pattern of LV diastolic filling.  Right Ventricle: The right ventricular size is normal. No increase in right ventricular wall thickness. Global RV systolic function is has normal systolic function. The tricuspid regurgitant velocity is 3.30 m/s, and with an assumed right atrial pressure  of 8 mmHg, the estimated right ventricular systolic pressure is moderately elevated at 51.6 mmHg.  Left Atrium: Left atrial size was mildly dilated.  Right Atrium: Right atrial size was normal in size  Pericardium: A large pericardial effusion is present. The pericardial effusion is circumferential. The pericardial effusion appears to contain echogenic mass lateral and anterior to the RV which may be fat pad or thrombus. There is inversion of the right  atrial wall, excessive respiratory variation in the tricuspid valve spectral Doppler velocities and excessive respiratory variation in the mitral valve spectral Doppler velocities. Pericardial effusion measuring up to 1.36 cm. Findings concerning for  early tamponade physiology.  Mitral Valve: The mitral valve is normal in  structure. No evidence of mitral valve stenosis by observation. MV Area by PHT, 4.60 cm. MV PHT, 47.85 msec.  Mild mitral valve regurgitation.  Tricuspid Valve: The tricuspid valve is normal in structure. Tricuspid valve regurgitation is mild by color flow Doppler.  Aortic Valve: The aortic valve The aortic valve is tricuspid. Aortic valve regurgitation is moderate by color flow Doppler. Aortic regurgitation PHT measures 302 msec. The aortic valve is structurally normal, with no evidence of sclerosis or stenosis.  Pulmonic Valve: The pulmonic valve was normal in structure. Pulmonic valve regurgitation is trivial by color flow Doppler.  Aorta: The aortic root, ascending aorta and aortic arch are all structurally normal, with no evidence of dilitation or obstruction.  Venous: The inferior vena cava is dilated in size with greater than 50% respiratory variability, suggesting right atrial pressure of 8 mmHg.  Shunts: A small patent foramen ovale is detected with predominantly right to left shunting across the atrial septum. No ventricular septal defect is seen or detected. There is no evidence of an atrial septal defect. No atrial level shunt detected by  color flow Doppler.  _____________  Lower extremity US 04/05/2019   Summary: Right: There is no evidence of deep vein thrombosis in the lower extremity. No cystic structure found in the popliteal fossa. Left: There is no evidence of deep vein thrombosis in the lower extremity. No cystic structure found in the popliteal fossa.  CT Chest/Abdomen/Pelvis (04/05/2019) FINDINGS: CT CHEST FINDINGS  Cardiovascular: Mild cardiomegaly. Small to moderate pericardial effusion. Thoracic aorta is nonaneurysmal. No acute vascular findings.  Mediastinum/Nodes: No axillary, mediastinal, or hilar lymphadenopathy. Unremarkable thyroid, trachea, and esophagus.  Lungs/Pleura: Small bilateral pleural effusions, left slightly greater than right.  Compressive atelectasis in the left lung base. Lungs are otherwise clear. No pneumothorax.  Musculoskeletal: Multiple chronic healed left-sided rib fractures. No acute osseous findings.  CT ABDOMEN PELVIS FINDINGS  Hepatobiliary: No focal liver abnormality is seen. No gallstones, gallbladder wall thickening, or biliary dilatation.  Pancreas: Unremarkable. No pancreatic ductal dilatation or surrounding inflammatory changes.  Spleen: Normal in size without focal abnormality.  Adrenals/Urinary Tract: Adrenal glands are unremarkable. Kidneys are normal, without renal calculi, focal lesion, or hydronephrosis. Bladder is unremarkable.  Stomach/Bowel: Stomach is within normal limits. Appendix not clearly visualized. Bowel well opacified with oral contrast. No evidence of bowel wall thickening, distention, or inflammatory changes.  Vascular/Lymphatic: No significant vascular findings are present. No enlarged abdominal or pelvic lymph nodes.  Reproductive: Uterus and bilateral adnexa are unremarkable.  Other: Small fat containing umbilical hernia. No ascites. Mild diffuse anasarca of the abdomen, pelvis and visualized proximal lower extremities.  Musculoskeletal: No acute or significant osseous findings.  IMPRESSION: 1. Findings of fluid overload including small bilateral pleural effusions, small-to-moderate pericardial effusion, and mild diffuse anasarca of the abdomen and pelvis. 2. No acute abdominopelvic findings.    History of Present Illness     Joanna Ali is a 68 y.o. female with hypertension, osteoarthritis and macular degeneration and anasarca. No known prior cardiac history. She has been evaluated for volume overload with normal LV function on echo. BNP normal. No cardiac cause of her volume overload has been found. Pt is being admitted for volume management and further evaluation.   Ms. Santore has been having progressively worsening lower extremity edema  extending up to her groins. She has also had splinter hemorrhages and blue toes.  There was concerns for possible endocarditis.  She underwent TEE on 03/28/2019 that showed no evidence of infective endocarditis.  She did have moderate aortic insufficiency that will need periodic surveillance.  She had moderate pericardial effusion with no evidence  of tamponade.  EF was normal at 60-65%.  Dr. Claiborne Billings ordered a panel of labs.  Her BNP was normal at 59, erythrocyte sedimentation rate 14 and CRP 38. Thyroid studies were normal. Lower extremity Doppler studies were negative. Apparently the patient said she had been told hold her beta-blocker therapy due to low blood pressure and has only been taking furosemide 40 mg in addition to potassium.    The patient was seen in the office today by Dr. Claiborne Billings and she admited that she is significantly more short of breath. She has now developed significant 3-4+ edema standing to her groin region bilaterally. She also admited to some lower abdominal swelling. She continued to admit to low-grade temperatures at 100 degrees every late afternoon. She had been scheduled to see a hematologist and had a tentative appointment to be seen later this week in the office.   Direct admission to the hospital was arranged due to her progressive symptomatology with plan for IV diuresis, possible metolazone, low-dose beta-blocker therapy and consider CT scan imaging of the chest and abdomen to make certain that there is no obstructive to her progressive lower extremity swelling.  She was noted to have a mild M spike on her evaluation and will benefit from hematology/oncology evaluation.  Her blood pressure in the office was elevated 140/80 without significant paradox.   Hospital Course     Consultants: GI, Dr. Oletta Lamas Oncology, Dr. Irene Limbo  #Anasarca/volume overload -Her work-up thus far has only revealed hypoalbuminemia as the etiology for her anasarca  -Her echocardiogram has a  pericardial effusion, however this is inconsistent with a diagnosis of tamponade on echocardiography, and more importantly she has no evidence of tamponade clinically -A CT chest/abdomen/pelvis revealed only gross anasarca of the chest, abdomen/pelvis and no obstructive vascular lesions or malignancy  -The reported M spike from outside rheumatology work-up was not seen here; hematology has seen her and has plan for a bit more work-up -All of her rheumatologic work-up including rheumatoid factor, anti-Jo/Ro, ANCA, ANA has been negative -SPEP was negative -Free light chains have been sent by hematology oncology -She does have low voltage on her EKG, however her echocardiogram is not consistent with a diagnosis of cardiac amyloidosis. -At this point, given that the only etiology for her diffuse anasarca is hypoalbuminemia, with extremely low cholesterol, I am concerned that there is a gastrointestinal etiology here.  Possibly a protein-losing enteropathy versus liver disease.  Her liver has been normal on CT scan, we will repeat a liver ultrasound today.  Gastroenterology was consulted who doubts liver disease contributing to her lower extremity edema.  Her albumin was not felt to be that low relative to most patients with cirrhosis.  Multiple studies did not show ascitic fluid in the abdomen. -She does not appear to be losing protein in her urine -Hospitalist team was asked to take over care but they had little else to offer.   -With no specific findings requiring action, pt would like to go home and she will follow up with her own gastroenterologist in [place] for further evaluation.  -We will need to follow her as a cardiology team in case she does eventually need a cardiac MRI, but at this time I would prefer a GI work-up first.  #Pericardial effusion, moderate, circumferential -This does not represent cardiac tamponade.  Her BNP and IVC are inconsistent with this diagnosis.  Clinically she has no  evidence of this. -Possibly related to her hypoalbuminemia  #Pleural effusions bilaterally -Continue diuresis -Will  discharge home on lasix 40 mg BID, although diuretics have not had much effect.   #Moderate aortic insufficiency -Likely degenerative valve disease.  The valve does appear thickened, she is not not in acute heart failure  #Hypertension -Continue beta-blocker for now  #Insomnia -c/o poor sleep with rousing after 1-2 hours of sleep.  -Will provide a one time Rx for trazodone as needed for sleep.   Patient has been seen by Dr. Audie Box today and deemed ready for discharge home. All follow up appointments have been scheduled. Discharge medications are listed below.  _____________  Discharge Vitals Blood pressure 107/75, pulse 77, temperature 98.6 F (37 C), temperature source Oral, resp. rate 20, weight 61.7 kg, SpO2 97 %.  Filed Weights   04/04/19 1750 04/06/19 0434 04/07/19 0542  Weight: 65.1 kg 61.7 kg 61.7 kg    Labs & Radiologic Studies    CBC Recent Labs    04/04/19 2000  WBC 4.5  HGB 10.2*  HCT 30.0*  MCV 94.6  PLT 696   Basic Metabolic Panel Recent Labs    04/06/19 0424 04/06/19 0841 04/07/19 0251  NA 134*  --  132*  K 3.3*  --  4.0  CL 99  --  97*  CO2 24  --  24  GLUCOSE 83  --  108*  BUN 17  --  21  CREATININE 0.83  --  0.89  CALCIUM 8.2*  --  8.4*  MG  --  1.3* 2.0   Liver Function Tests Recent Labs    04/04/19 2000  AST 32  ALT 18  ALKPHOS 53  BILITOT 0.8  PROT 4.9*  ALBUMIN 2.5*   No results for input(s): LIPASE, AMYLASE in the last 72 hours. High Sensitivity Troponin:   Recent Labs  Lab 04/06/19 0841  TROPONINIHS 10    BNP Invalid input(s): POCBNP D-Dimer No results for input(s): DDIMER in the last 72 hours. Hemoglobin A1C No results for input(s): HGBA1C in the last 72 hours. Fasting Lipid Panel Recent Labs    04/06/19 0841  CHOL 74  HDL 27*  LDLCALC 33  TRIG 72  CHOLHDL 2.7   Thyroid Function  Tests Recent Labs    04/07/19 0251  TSH 4.565*   _____________  Ct Chest W Contrast  Result Date: 04/05/2019 CLINICAL DATA:  Weakness, anasarca, dyspnea EXAM: CT CHEST, ABDOMEN, AND PELVIS WITH CONTRAST TECHNIQUE: Multidetector CT imaging of the chest, abdomen and pelvis was performed following the standard protocol during bolus administration of intravenous contrast. CONTRAST:  142m OMNIPAQUE IOHEXOL 300 MG/ML  SOLN COMPARISON:  None. FINDINGS: CT CHEST FINDINGS Cardiovascular: Mild cardiomegaly. Small to moderate pericardial effusion. Thoracic aorta is nonaneurysmal. No acute vascular findings. Mediastinum/Nodes: No axillary, mediastinal, or hilar lymphadenopathy. Unremarkable thyroid, trachea, and esophagus. Lungs/Pleura: Small bilateral pleural effusions, left slightly greater than right. Compressive atelectasis in the left lung base. Lungs are otherwise clear. No pneumothorax. Musculoskeletal: Multiple chronic healed left-sided rib fractures. No acute osseous findings. CT ABDOMEN PELVIS FINDINGS Hepatobiliary: No focal liver abnormality is seen. No gallstones, gallbladder wall thickening, or biliary dilatation. Pancreas: Unremarkable. No pancreatic ductal dilatation or surrounding inflammatory changes. Spleen: Normal in size without focal abnormality. Adrenals/Urinary Tract: Adrenal glands are unremarkable. Kidneys are normal, without renal calculi, focal lesion, or hydronephrosis. Bladder is unremarkable. Stomach/Bowel: Stomach is within normal limits. Appendix not clearly visualized. Bowel well opacified with oral contrast. No evidence of bowel wall thickening, distention, or inflammatory changes. Vascular/Lymphatic: No significant vascular findings are present. No enlarged abdominal or  pelvic lymph nodes. Reproductive: Uterus and bilateral adnexa are unremarkable. Other: Small fat containing umbilical hernia. No ascites. Mild diffuse anasarca of the abdomen, pelvis and visualized proximal lower  extremities. Musculoskeletal: No acute or significant osseous findings. IMPRESSION: 1. Findings of fluid overload including small bilateral pleural effusions, small-to-moderate pericardial effusion, and mild diffuse anasarca of the abdomen and pelvis. 2. No acute abdominopelvic findings. Electronically Signed   By: Davina Poke M.D.   On: 04/05/2019 14:52   Ct Abdomen Pelvis W Contrast  Result Date: 04/05/2019 CLINICAL DATA:  Weakness, anasarca, dyspnea EXAM: CT CHEST, ABDOMEN, AND PELVIS WITH CONTRAST TECHNIQUE: Multidetector CT imaging of the chest, abdomen and pelvis was performed following the standard protocol during bolus administration of intravenous contrast. CONTRAST:  181m OMNIPAQUE IOHEXOL 300 MG/ML  SOLN COMPARISON:  None. FINDINGS: CT CHEST FINDINGS Cardiovascular: Mild cardiomegaly. Small to moderate pericardial effusion. Thoracic aorta is nonaneurysmal. No acute vascular findings. Mediastinum/Nodes: No axillary, mediastinal, or hilar lymphadenopathy. Unremarkable thyroid, trachea, and esophagus. Lungs/Pleura: Small bilateral pleural effusions, left slightly greater than right. Compressive atelectasis in the left lung base. Lungs are otherwise clear. No pneumothorax. Musculoskeletal: Multiple chronic healed left-sided rib fractures. No acute osseous findings. CT ABDOMEN PELVIS FINDINGS Hepatobiliary: No focal liver abnormality is seen. No gallstones, gallbladder wall thickening, or biliary dilatation. Pancreas: Unremarkable. No pancreatic ductal dilatation or surrounding inflammatory changes. Spleen: Normal in size without focal abnormality. Adrenals/Urinary Tract: Adrenal glands are unremarkable. Kidneys are normal, without renal calculi, focal lesion, or hydronephrosis. Bladder is unremarkable. Stomach/Bowel: Stomach is within normal limits. Appendix not clearly visualized. Bowel well opacified with oral contrast. No evidence of bowel wall thickening, distention, or inflammatory changes.  Vascular/Lymphatic: No significant vascular findings are present. No enlarged abdominal or pelvic lymph nodes. Reproductive: Uterus and bilateral adnexa are unremarkable. Other: Small fat containing umbilical hernia. No ascites. Mild diffuse anasarca of the abdomen, pelvis and visualized proximal lower extremities. Musculoskeletal: No acute or significant osseous findings. IMPRESSION: 1. Findings of fluid overload including small bilateral pleural effusions, small-to-moderate pericardial effusion, and mild diffuse anasarca of the abdomen and pelvis. 2. No acute abdominopelvic findings. Electronically Signed   By: NDavina PokeM.D.   On: 04/05/2019 14:52   [place]domen Limited  Result Date: 04/07/2019 CLINICAL DATA:  Anasarca, EXAM: ULTRASOUND ABDOMEN LIMITED and ascites search COMPARISON:  CT chest, abdomen and pelvis 04/05/2019 FINDINGS: Multiplanar, realtime, grayscale and limited color Doppler imaging of the right upper quadrant was performed. Four quadrant examination for ascites also was performed. Liver displays a coarsened echotexture with question of contour nodularity and mildly heterogeneous appearance. There is hepatopetal flow demonstrated in the portal vein. Common bile duct is at upper limits of normal at approximately 6 mm. No signs of cholelithiasis. Mild gallbladder wall thickening at 3 mm with trace amount of pericholecystic fluid. Limited imaging of the inferior vena cava is unremarkable. Midline structures are obscured by overlying bowel gas. Limited images of the right kidney displays no signs of hydronephrosis. Small left-sided pleural effusion. IMPRESSION: 1. Coarsened echotexture with areas of variable echogenicity may represent early signs of liver disease with fatty infiltration correlate for clinical or laboratory evidence of liver disease. 2. Nonspecific appearance of the gallbladder with mildly thickened wall and trace pericholecystic fluid, correlate with any clinical signs of  cholecystitis but is likely related to anasarca and potential liver disease. 3. Small left pleural effusion without ultrasound evidence of ascites at this time. Electronically Signed   By: GZetta BillsM.D.   On: 04/07/2019  11:30   Dg Chest Port 1 View  Result Date: 04/05/2019 CLINICAL DATA:  Shortness of breath EXAM: PORTABLE CHEST 1 VIEW COMPARISON:  CT from earlier in the same day. FINDINGS: Progressive left basilar infiltrate is noted with small left pleural effusion. Tiny right pleural effusion is seen. No pneumothorax is noted. Cardiac shadow is enlarged. No bony abnormality is seen. IMPRESSION: Increasing left basilar infiltrate and effusion. Tiny right effusion. Electronically Signed   By: Inez Catalina M.D.   On: 04/05/2019 21:25   Vas [place] Abi With/wo Tbi  Result Date: 03/24/2019 LOWER EXTREMITY DOPPLER STUDY Indications: Patient reports having blue toes and fingers along with bilateral              lower extremity and foot edema x 2 months. Denies claudication              symptoms but does have aches and pains and cramps in her legs at              nighttime. Poor pulses noted on Physician exam with at least 2+              pitting edema. High Risk Factors: Hypertension, past history of smoking.  Performing Technologist: Mariane Masters RVT  Examination Guidelines: A complete evaluation includes at minimum, Doppler waveform signals and systolic blood pressure reading at the level of bilateral brachial, anterior tibial, and posterior tibial arteries, when vessel segments are accessible. Bilateral testing is considered an integral part of a complete examination. Photoelectric Plethysmograph (PPG) waveforms and toe systolic pressure readings are included as required and additional duplex testing as needed. Limited examinations for reoccurring indications may be performed as noted.  ABI Findings: +---------+------------------+-----+---------+--------+  Right     Rt Pressure (mmHg) Index Waveform   Comment   +---------+------------------+-----+---------+--------+  Brachial  103                                          +---------+------------------+-----+---------+--------+  ATA       109                1.06  triphasic           +---------+------------------+-----+---------+--------+  PTA       139                1.35  triphasic           +---------+------------------+-----+---------+--------+  PERO      127                1.23  biphasic            +---------+------------------+-----+---------+--------+  Great Toe 80                 0.78                      +---------+------------------+-----+---------+--------+ +---------+------------------+-----+---------+-------+  Left      Lt Pressure (mmHg) Index Waveform  Comment  +---------+------------------+-----+---------+-------+  Brachial  96                                          +---------+------------------+-----+---------+-------+  ATA       122  1.18  triphasic          +---------+------------------+-----+---------+-------+  PTA       133                1.29  triphasic          +---------+------------------+-----+---------+-------+  PERO      121                1.17  biphasic           +---------+------------------+-----+---------+-------+  Great Toe 59                 0.57                     +---------+------------------+-----+---------+-------+ +-------+-----------+-----------+------------+------------+  ABI/TBI Today's ABI Today's TBI Previous ABI Previous TBI  +-------+-----------+-----------+------------+------------+  Right   1.3         0.78                                   +-------+-----------+-----------+------------+------------+  Left    1.29        0.57                                   +-------+-----------+-----------+------------+------------+ OES Findings:  Digital PPG tracings are somewhat diminished with borderline pulsatile flow.    Summary: Right: Resting right ankle-brachial index is within normal range. No evidence of  significant right lower extremity arterial disease. The right toe-brachial index is normal. Left: Resting left ankle-brachial index is within normal range. No evidence of significant left lower extremity arterial disease. The left toe-brachial index is abnormal.  *See table(s) above for measurements and observations. See arterial duplex and aorta iliac duplex reports.  Electronically signed by Larae Grooms MD on 03/24/2019 at 5:29:26 PM.    Final    Vas [place] Lower Extremity Arterial Duplex  Result Date: 03/24/2019 LOWER EXTREMITY ARTERIAL DUPLEX STUDY Indications: Patient reports having blue toes and fingers along with bilateral              lower extremity and foot edema x 2 months. Denies claudication              symptoms but does have aches and pains and cramps in her legs at              nighttime. Poor pulses noted on Physician exam with at least 2 +              pitting edema. Evaluate for source of possible emboli. High Risk Factors: Hypertension, past history of smoking.  Current ABI: Today's ABIs are 1.3 on the right and 1.29 on the left. Performing Technologist: Mariane Masters RVT  Examination Guidelines: A complete evaluation includes B-mode imaging, spectral Doppler, color Doppler, and power Doppler as needed of all accessible portions of each vessel. Bilateral testing is considered an integral part of a complete examination. Limited examinations for reoccurring indications may be performed as noted.  +-----------+--------+-----+--------+---------+--------+  RIGHT       PSV cm/s Ratio Stenosis Waveform  Comments  +-----------+--------+-----+--------+---------+--------+  CFA Prox    106                     triphasic           +-----------+--------+-----+--------+---------+--------+  DFA  52                      triphasic           +-----------+--------+-----+--------+---------+--------+  SFA Prox    69                      triphasic            +-----------+--------+-----+--------+---------+--------+  SFA Mid     74                      triphasic           +-----------+--------+-----+--------+---------+--------+  SFA Distal  87                      triphasic           +-----------+--------+-----+--------+---------+--------+  POP Prox    69                      triphasic           +-----------+--------+-----+--------+---------+--------+  POP Distal  71                      triphasic           +-----------+--------+-----+--------+---------+--------+  TP Trunk    67                      triphasic           +-----------+--------+-----+--------+---------+--------+  ATA Distal  61                      triphasic           +-----------+--------+-----+--------+---------+--------+  PTA Distal  75                      triphasic           +-----------+--------+-----+--------+---------+--------+  PERO Distal 50                      triphasic           +-----------+--------+-----+--------+---------+--------+  +-----------+--------+-----+--------+---------+--------+  LEFT        PSV cm/s Ratio Stenosis Waveform  Comments  +-----------+--------+-----+--------+---------+--------+  CFA Prox    108                     triphasic           +-----------+--------+-----+--------+---------+--------+  DFA         59                      triphasic           +-----------+--------+-----+--------+---------+--------+  SFA Prox    90                      triphasic           +-----------+--------+-----+--------+---------+--------+  SFA Mid     88                      triphasic           +-----------+--------+-----+--------+---------+--------+  SFA Distal  106                     triphasic           +-----------+--------+-----+--------+---------+--------+  POP Prox    71                      triphasic           +-----------+--------+-----+--------+---------+--------+  POP Distal  56                      triphasic           +-----------+--------+-----+--------+---------+--------+  TP Trunk     71                      triphasic           +-----------+--------+-----+--------+---------+--------+  ATA Distal  54                      triphasic           +-----------+--------+-----+--------+---------+--------+  PTA Distal  78                      triphasic           +-----------+--------+-----+--------+---------+--------+  PERO Distal 45                      triphasic           +-----------+--------+-----+--------+---------+--------+  Summary: Right: Normal examination. No evidence of arterial occlusive disease. Left: Normal examination. No evidence of arterial occlusive disease.  See table(s) above for measurements and observations. See ABI and aorta-iliac duplex reports. Electronically signed by Larae Grooms MD on 03/24/2019 at 5:35:23 PM.    Final    Vas [place] Lower Extremity Venous (dvt)  Result Date: 04/05/2019  Lower Venous Study Indications: Edema.  Comparison Study: no prior Performing Technologist: Abram Sander RVS  Examination Guidelines: A complete evaluation includes B-mode imaging, spectral Doppler, color Doppler, and power Doppler as needed of all accessible portions of each vessel. Bilateral testing is considered an integral part of a complete examination. Limited examinations for reoccurring indications may be performed as noted.  +---------+---------------+---------+-----------+----------+--------------+  RIGHT     Compressibility Phasicity Spontaneity Properties Thrombus Aging  +---------+---------------+---------+-----------+----------+--------------+  CFV       Full            Yes       Yes                                    +---------+---------------+---------+-----------+----------+--------------+  SFJ       Full                                                             +---------+---------------+---------+-----------+----------+--------------+  FV Prox   Full                                                              +---------+---------------+---------+-----------+----------+--------------+  FV Mid    Full                                                             +---------+---------------+---------+-----------+----------+--------------+  FV Distal Full                                                             +---------+---------------+---------+-----------+----------+--------------+  PFV       Full                                                             +---------+---------------+---------+-----------+----------+--------------+  POP       Full            Yes       Yes                                    +---------+---------------+---------+-----------+----------+--------------+  PTV       Full                                                             +---------+---------------+---------+-----------+----------+--------------+  PERO                                                       Not visualized  +---------+---------------+---------+-----------+----------+--------------+   +---------+---------------+---------+-----------+----------+--------------+  LEFT      Compressibility Phasicity Spontaneity Properties Thrombus Aging  +---------+---------------+---------+-----------+----------+--------------+  CFV       Full            Yes       Yes                                    +---------+---------------+---------+-----------+----------+--------------+  SFJ       Full                                                             +---------+---------------+---------+-----------+----------+--------------+  FV Prox   Full                                                             +---------+---------------+---------+-----------+----------+--------------+  FV Mid    Full                                                             +---------+---------------+---------+-----------+----------+--------------+  FV Distal Full                                                              +---------+---------------+---------+-----------+----------+--------------+  PFV       Full                                                             +---------+---------------+---------+-----------+----------+--------------+  POP       Full            Yes       Yes                                    +---------+---------------+---------+-----------+----------+--------------+  PTV       Full                                                             +---------+---------------+---------+-----------+----------+--------------+  PERO                                                       Not visualized  +---------+---------------+---------+-----------+----------+--------------+     Summary: Right: There is no evidence of deep vein thrombosis in the lower extremity. No cystic structure found in the popliteal fossa. Left: There is no evidence of deep vein thrombosis in the lower extremity. No cystic structure found in the popliteal fossa.  *See table(s) above for measurements and observations. Electronically signed by Harold Barban MD on 04/05/2019 at 4:31:10 PM.    Final    Vas US Aorta/ivc/iliacs  Result Date: 03/24/2019 ABDOMINAL AORTA STUDY Indications: Patient reports having blue toes and fingers along with bilateral              lower extremity and foot edema x 2 months. Denies claudication              symptoms but does have aches and pains and cramps in her legs at              nighttime. Poor pulses noted on Physician exam with at least 2 +              pitting edema. Evaluate for source of possible emboli. Risk Factors: Hypertension, past history of smoking. Limitations: Air/bowel gas.  Performing Technologist: Mariane Masters RVT  Examination Guidelines: A complete evaluation includes B-mode imaging, spectral Doppler, color Doppler, and power Doppler as needed of all accessible portions of each vessel. Bilateral testing is considered an integral part of a complete examination. Limited examinations for  reoccurring indications may be performed as noted.  Abdominal Aorta Findings: +-------------+-------+----------+----------+---------+--------+--------+  Location      AP (cm) Trans (cm) PSV (cm/s) Waveform  Thrombus Comments  +-------------+-------+----------+----------+---------+--------+--------+  Proximal      2.50    2.50       82         biphasic                     +-------------+-------+----------+----------+---------+--------+--------+  Mid           1.80    1.80       52         triphasic                    +-------------+-------+----------+----------+---------+--------+--------+  Distal        1.60    1.60       56         triphasic                    +-------------+-------+----------+----------+---------+--------+--------+  RT CIA Prox   1.5     1.5        68         triphasic                    +-------------+-------+----------+----------+---------+--------+--------+  RT CIA Mid                       58         triphasic                    +-------------+-------+----------+----------+---------+--------+--------+  RT CIA Distal                    56         biphasic                     +-------------+-------+----------+----------+---------+--------+--------+  RT EIA Prox                      144        triphasic                    +-------------+-------+----------+----------+---------+--------+--------+  RT EIA Mid                       83         triphasic                    +-------------+-------+----------+----------+---------+--------+--------+  RT EIA Distal                    109        triphasic                    +-------------+-------+----------+----------+---------+--------+--------+  LT CIA Prox   1.1     1.2        91         triphasic                    +-------------+-------+----------+----------+---------+--------+--------+  LT CIA Mid                       81         biphasic                     +-------------+-------+----------+----------+---------+--------+--------+  LT CIA Distal  138        triphasic                    +-------------+-------+----------+----------+---------+--------+--------+  LT EIA Prox                      92         triphasic                    +-------------+-------+----------+----------+---------+--------+--------+  LT EIA Mid                       144        triphasic                    +-------------+-------+----------+----------+---------+--------+--------+  LT EIA Distal                    93         triphasic                    +-------------+-------+----------+----------+---------+--------+--------+ IVC/Iliac Findings: +--------+------+--------+--------+    IVC    Patent Thrombus Comments  +--------+------+--------+--------+  IVC Prox patent                    +--------+------+--------+--------+   Summary: Abdominal Aorta: No evidence of an abdominal aortic aneurysm was visualized. The largest aortic measurement is 2.5 cm. The right common iliac artery is mildly ectatic measuring 1.5 cm. Stenosis: No evidence of stenosis in the aorta or bilateral iliac arteries.  *See table(s) above for measurements and observations. See ABI and arterial duplex reports.  Electronically signed by Larae Grooms MD on 03/24/2019 at 5:33:51 PM.    Final    Disposition   Pt is being discharged home today in good condition.  Follow-up Plans & Appointments    Follow-up Information    Gastroenterology, Physician, MD Follow up.   Specialty: Gastroenterology Why: Please follow up with your gastroenterologist in [place] for further evaluation.  Contact information: [place] [address] [phone]        Troy Sine, MD Follow up.   Specialty: Cardiology Why: Cardiology follow up on 05/19/2019 at 10:00.  Contact information: [address] [place] [address] [phone]          Discharge Instructions    Diet - low sodium heart healthy   Complete by: As directed    Increase activity slowly   Complete  by: As directed       Discharge Medications   Allergies as of 04/07/2019   No Known Allergies     Medication List    TAKE these medications   carvedilol 12.5 MG tablet Commonly known as: COREG Take 1 tablet (12.5 mg total) by mouth 2 (two) times daily with a meal.   furosemide 40 MG tablet Commonly known as: LASIX Take 1 tablet (40 mg total) by mouth 2 (two) times daily. What changed: when to take this   potassium chloride SA 20 MEQ tablet Commonly known as: K-DUR Take 1 tablet (20 mEq total) by mouth daily.   PreserVision AREDS 2 Caps Take 1 capsule by mouth 2 (two) times daily.   traZODone 50 MG tablet Commonly known as: DESYREL Take 1 tablet (50 mg total) by mouth at bedtime as needed for sleep.   TURMERIC PO Take 1 capsule by mouth 2 (two) times daily.  Acute coronary syndrome (MI, NSTEMI, STEMI, etc) this admission?: No.    Outstanding Labs/Studies     Duration of Discharge Encounter   Greater than 30 minutes including physician time.  Signed, Daune Perch, NP 04/07/2019, 3:07 PM    Patient seen and examined, note reviewed with the signed Advanced Practice Provider. I personally reviewed laboratory data, imaging studies and relevant notes. I independently examined the patient and formulated the important aspects of the plan. I have personally discussed the plan with the patient and/or family. Comments or changes to the note/plan are indicated below.  Overall, this is a very disappointing outcome for Mrs. Stokes. Her work-up for her gross anasarca only revealed hypoalbuminemia as the potential etiology. Her cardiac work-up and work-up for malignancy has been negative. We greatly appreciate our oncology team's input. She has no evidence of cirrhosis to explain her low albumin or low cholesterol levels. A cardiac etiology is not here, except for the fact that her pericardial effusion is likely a manifestation of an undiagnosed systemic illness. She has  no evidence of cardiac tamponade to explain her anasarca, as the effusion has no effect on her hemodynamics. Extensive rheumatological evaluation is negative. An obstructive process is not here either on numerous imaging studies. The gross volume overload (pleural effusion, pericardial effusion, abdominal/pelvic anasarca) was not felt to be related to her hypoalbuminemia by GI. She will follow with [place] in cardiology clinic moving forward, and I hope whatever process is causing her symptoms, will reveal itself in the near future.    Signed, Addison Naegeli. Audie Box, [place]  04/07/2019 5:03 PM

## 2019-04-07 NOTE — Progress Notes (Addendum)
Cardiologist on call notified of pt's bp being 98/63 w an order for 40 mg of IV lasix. Per md ok to give. Will give lasix & continue to monitor the pt. Hoover Brunette, RN   Cardiologist called again around 0025 d/t pt c/o 7/10 lt leg pain that is sharp. Pain is mostly behind knee & on lt thigh. Pt's legs are tender to touch & warm bilaterally. Pulses & capillary refill check & unchanged from previously. New orders for a lidocaine patch. Will continue to monitor the pt. Hoover Brunette, RN

## 2019-04-07 NOTE — Addendum Note (Signed)
Addended by: Diana Eves on: 04/07/2019 03:53 PM   Modules accepted: Orders

## 2019-04-07 NOTE — Progress Notes (Signed)
PT Cancellation Note  Patient Details Name: Joanna Ali MRN: [id number] DOB: 1951/07/08   Cancelled Treatment:    Reason Eval/Treat Not Completed: PT screened, no needs identified, will sign off Per RN, NP stated pt did not need to be seen prior to d/c. Will sign off. If needs change, please re-consult.   Leighton Ruff, PT, DPT  Acute Rehabilitation Services  Pager: [phone] Office: [phone]    Rudean Hitt 04/07/2019, 3:21 PM

## 2019-04-07 NOTE — Discharge Instructions (Signed)
Edema  Edema is when you have too much fluid in your body or under your skin. Edema may make your legs, feet, and ankles swell up. Swelling is also common in looser tissues, like around your eyes. This is a common condition. It gets more common as you get older. There are many possible causes of edema. Eating too much salt (sodium) and being on your feet or sitting for a long time can cause edema in your legs, feet, and ankles. Hot weather may make edema worse. Edema is usually painless. Your skin may look swollen or shiny. Follow these instructions at home:  Keep the swollen body part raised (elevated) above the level of your heart when you are sitting or lying down.  Do not sit still or stand for a long time.  Do not wear tight clothes. Do not wear garters on your upper legs.  Exercise your legs. This can help the swelling go down.  Wear elastic bandages or support stockings as told by your doctor.  Eat a low-salt (low-sodium) diet to reduce fluid as told by your doctor.  Depending on the cause of your swelling, you may need to limit how much fluid you drink (fluid restriction).  Take over-the-counter and prescription medicines only as told by your doctor. Contact a doctor if:  Treatment is not working.  You have heart, liver, or kidney disease and have symptoms of edema.  You have sudden and unexplained weight gain. Get help right away if:  You have shortness of breath or chest pain.  You cannot breathe when you lie down.  You have pain, redness, or warmth in the swollen areas.  You have heart, liver, or kidney disease and get edema all of a sudden.  You have a fever and your symptoms get worse all of a sudden. Summary  Edema is when you have too much fluid in your body or under your skin.  Edema may make your legs, feet, and ankles swell up. Swelling is also common in looser tissues, like around your eyes.  Raise (elevate) the swollen body part above the level of your  heart when you are sitting or lying down.  Follow your doctor's instructions about diet and how much fluid you can drink (fluid restriction). This information is not intended to replace advice given to you by your health care provider. Make sure you discuss any questions you have with your health care provider. Document Released: 12/23/2007 Document Revised: 07/09/2017 Document Reviewed: 07/24/2016 Elsevier Patient Education  2020 [employer]  

## 2019-04-07 NOTE — Consult Note (Signed)
[facility] CONSULT Reason for consult: Question of liver disease or protein-losing neuropathy Referring Physician: Cardiology service  Joanna Ali is an 68 y.o. female.  HPI: She had been doing well until about 3 months ago when she began to have swelling of her lower extremities.  This is been a real problem and she was seen by her PCP Dr. another town.  According to the patient she had labs done to look for signs of liver disease and an ultrasound.  There was no ascites or abnormal liver on ultrasound.  Serology for hepatitis A, B, and C were negative, ultrasound was [place] in [place] todayShowing possible fatty liver with slightly thickened gallbladder spleen appeared normal there was hepato-pedal flow.  Of note, there was no ascites.  CT scan of the chest and abdomen showed cardiomegaly small pericardial effusion no real pleural effusion or pulmonary fluid.  There were small effusions.  The abdominal CT showed no abnormality spleen and liver looked okay there was no ascites.  Patient has continued to have swelling of her lower extremities and has had an extensive evaluation by cardiology, neurology, rheumatology and hematology.  No clear explanation has been determined.  She is undergone TEE showing no endocarditis mild aortic insufficiency and a moderate pericardial effusion.  The patient does complain of swelling in her legs which is been painful but she has no shortness of breath routinely.  Her sed rate and CRP have been okay.  She was having increasing pain and apparently some shortness of breath that she came overloaded with fluid and was admitted to the hospital. We were asked to see her but because it was felt she may have cirrhosis of the liver since she does drink several glasses of wine a week and has for a number of years.  There was also concern of amyloid and possible protein-losing enteropathy.  Her initial protein was low at 5.2 and albumin was 3.2.  Urinalysis was  negative for proteinuria The patient denies any history of diarrhea in fact she is normally constipated and has to take laxatives.  She denies any family history of celiac disease sprue etc.  A qualitative fecal fat study was obtained and was normal.  Past Medical History:  Diagnosis Date  . Hypertension   . Leg swelling 03/2019  . Short of breath on exertion 03/2019    Past Surgical History:  Procedure Laterality Date  . APPENDECTOMY    . NECK SURGERY    . TEE WITHOUT CARDIOVERSION N/A 03/28/2019   Procedure: TRANSESOPHAGEAL ECHOCARDIOGRAM (TEE);  Surgeon: Geralynn Rile, MD;  Location: [facility];  Service: Cardiology;  Laterality: N/A;    Family History  Problem Relation Age of Onset  . Hypertension Mother     Social History:  reports that she quit smoking about 40 years ago. Her smoking use included cigarettes. [name]. She has never used smokeless tobacco. [name].  Allergies: No Known Allergies  Medications; Prior to Admission medications   Medication Sig Start Date End Date Taking? Authorizing Provider  furosemide (LASIX) 40 MG tablet Take 1 tablet (40 mg total) by mouth daily. 03/20/19 06/18/19 Yes Troy Sine, MD  Multiple Vitamins-Minerals (PRESERVISION AREDS 2) CAPS Take 1 capsule by mouth 2 (two) times daily.    Yes [provider]  potassium chloride SA (K-DUR) 20 MEQ tablet Take 1 tablet (20 mEq total) by mouth daily. 03/21/19  Yes Claiborne Billings,  Joyice Faster, MD  TURMERIC PO Take 1 capsule by mouth 2 (two) times daily.   Yes [provider]   . carvedilol  12.5 mg Oral BID WC  . enoxaparin (LOVENOX) injection  40 mg Subcutaneous Q24H  . furosemide  40 mg Intravenous Q12H  . lidocaine  1 patch Transdermal QHS  . sodium chloride flush  3 mL Intravenous Q12H   PRN Meds sodium chloride, acetaminophen, hydrALAZINE, ondansetron (ZOFRAN) IV, sodium chloride flush Results  for orders placed or performed during the hospital encounter of 04/04/19 (from the past 48 hour(s))  Ferritin     Status: Abnormal   Collection Time: 04/05/19  5:27 PM  Result Value Ref Range   Ferritin 379 (H) 11 - 307 ng/mL    Comment: Performed at [facility], [place] [address], [place], [place] [address]  Basic metabolic panel     Status: Abnormal   Collection Time: 04/06/19  4:24 AM  Result Value Ref Range   Sodium 134 (L) 135 - 145 mmol/L   Potassium 3.3 (L) 3.5 - 5.1 mmol/L   Chloride 99 98 - 111 mmol/L   CO2 24 22 - 32 mmol/L   Glucose, Bld 83 70 - 99 mg/dL   BUN 17 8 - 23 mg/dL   Creatinine, Ser 0.83 0.44 - 1.00 mg/dL   Calcium 8.2 (L) 8.9 - 10.3 mg/dL   GFR calc non Af Amer >60 >60 mL/min   GFR calc Af Amer >60 >60 mL/min   Anion gap 11 5 - 15    Comment: Performed at [facility], [place] [address], [place], [place] [address]  Cholesterol, total     Status: None   Collection Time: 04/06/19  4:24 AM  Result Value Ref Range   Cholesterol 68 0 - 200 mg/dL    Comment: Performed at [facility] [address] [place], [place] [address]  Lipid panel     Status: Abnormal   Collection Time: 04/06/19  8:41 AM  Result Value Ref Range   Cholesterol 74 0 - 200 mg/dL   Triglycerides 72 <150 mg/dL   HDL 27 (L) >40 mg/dL   Total CHOL/HDL Ratio 2.7 RATIO   VLDL 14 0 - 40 mg/dL   LDL Cholesterol 33 0 - 99 mg/dL    Comment:        Total Cholesterol/HDL:CHD Risk Coronary Heart Disease Risk Table                     Men   Women  1/2 Average Risk   3.4   3.3  Average Risk       5.0   4.4  2 X Average Risk   9.6   7.1  3 X Average Risk  23.4   11.0        Use the calculated Patient Ratio above and the CHD Risk Table to determine the patient's CHD Risk.        ATP III CLASSIFICATION (LDL):  <100     mg/dL   Optimal  100-129  mg/dL   Near or Above                    Optimal  130-159  mg/dL   Borderline  160-189  mg/dL   High  >190     mg/dL   Very  High Performed at [facility] [address], [place], [place] [address]   Troponin I (  High Sensitivity)     Status: None   Collection Time: 04/06/19  8:41 AM  Result Value Ref Range   Troponin I (High Sensitivity) 10 <18 ng/L    Comment: (NOTE) Elevated high sensitivity troponin I (hsTnI) values and significant  changes across serial measurements may suggest ACS but many other  chronic and acute conditions are known to elevate hsTnI results.  Refer to the "Links" section for chest pain algorithms and additional  guidance. Performed at [facility], [place] [address] [place], [place] [address]   Magnesium     Status: Abnormal   Collection Time: 04/06/19  8:41 AM  Result Value Ref Range   Magnesium 1.3 (L) 1.7 - 2.4 mg/dL    Comment: Performed at [facility] [address], [place], [place] [address]  Fecal fat, qualitative     Status: None   Collection Time: 04/06/19  3:18 PM  Result Value Ref Range   Fat Qual Total, Stl Normal     Comment: (NOTE)                              Normal (<100 Droplets/HPF) This test was developed and its performance characteristics determined by [facility]. It has not been cleared or approved by the Food and Drug Administration. Performed At: [facility] [place], [place] [id number] Rush Farmer MD IO:[id number]    Fat Qual Neutral, Stl Normal     Comment: (NOTE)                               Normal (<60 Droplets/HPF) This test was developed and its performance characteristics determined by [facility]. It has not been cleared or approved by the Food and Drug Administration.   Basic metabolic panel     Status: Abnormal   Collection Time: 04/07/19  2:51 AM  Result Value Ref Range   Sodium 132 (L) 135 - 145 mmol/L   Potassium 4.0 3.5 - 5.1 mmol/L    Comment: NO VISIBLE HEMOLYSIS   Chloride 97 (L) 98 - 111 mmol/L   CO2 24 22 - 32 mmol/L   Glucose, Bld 108 (H) 70 - 99 mg/dL   BUN 21 8 - 23 mg/dL    Creatinine, Ser 0.89 0.44 - 1.00 mg/dL   Calcium 8.4 (L) 8.9 - 10.3 mg/dL   GFR calc non Af Amer >60 >60 mL/min   GFR calc Af Amer >60 >60 mL/min   Anion gap 11 5 - 15    Comment: Performed at [facility], [place] [address] [place], [place] [address]  TSH     Status: Abnormal   Collection Time: 04/07/19  2:51 AM  Result Value Ref Range   TSH 4.565 (H) 0.350 - 4.500 uIU/mL    Comment: Performed by a 3rd Generation assay with a functional sensitivity of <=0.01 uIU/mL. Performed at [facility], [place] [address], [place], [place] [address]   T4, free     Status: Abnormal   Collection Time: 04/07/19  2:51 AM  Result Value Ref Range   Free T4 1.37 (H) 0.61 - 1.12 ng/dL    Comment: (NOTE) Biotin ingestion may interfere with free T4 tests. If the results are inconsistent with the TSH level, previous test results, or the clinical presentation, then consider biotin interference. If needed, order repeat testing after stopping biotin. Performed  at [facility], [place] [address], [place], [place] [address]   Protime-INR     Status: None   Collection Time: 04/07/19  2:51 AM  Result Value Ref Range   Prothrombin Time 14.6 11.4 - 15.2 seconds   INR 1.2 0.8 - 1.2    Comment: (NOTE) INR goal varies based on device and disease states. Performed at [facility], [place] [address], [place], [place] [address]   Fibrinogen     Status: None   Collection Time: 04/07/19  2:51 AM  Result Value Ref Range   Fibrinogen 383 210 - 475 mg/dL    Comment: Performed at [facility] [address] [place], [place] [address]  Magnesium     Status: None   Collection Time: 04/07/19  2:51 AM  Result Value Ref Range   Magnesium 2.0 1.7 - 2.4 mg/dL    Comment: Performed at [facility], [place] [address] [place], [place] [address]    Ct Chest W Contrast  Result Date: 04/05/2019 CLINICAL DATA:  Weakness, anasarca, dyspnea EXAM: CT CHEST, ABDOMEN, AND PELVIS WITH CONTRAST TECHNIQUE:  Multidetector CT imaging of the chest, abdomen and pelvis was performed following the standard protocol during bolus administration of intravenous contrast. CONTRAST:  138m OMNIPAQUE IOHEXOL 300 MG/ML  SOLN COMPARISON:  None. FINDINGS: CT CHEST FINDINGS Cardiovascular: Mild cardiomegaly. Small to moderate pericardial effusion. Thoracic aorta is nonaneurysmal. No acute vascular findings. Mediastinum/Nodes: No axillary, mediastinal, or hilar lymphadenopathy. Unremarkable thyroid, trachea, and esophagus. Lungs/Pleura: Small bilateral pleural effusions, left slightly greater than right. Compressive atelectasis in the left lung base. Lungs are otherwise clear. No pneumothorax. Musculoskeletal: Multiple chronic healed left-sided rib fractures. No acute osseous findings. CT ABDOMEN PELVIS FINDINGS Hepatobiliary: No focal liver abnormality is seen. No gallstones, gallbladder wall thickening, or biliary dilatation. Pancreas: Unremarkable. No pancreatic ductal dilatation or surrounding inflammatory changes. Spleen: Normal in size without focal abnormality. Adrenals/Urinary Tract: Adrenal glands are unremarkable. Kidneys are normal, without renal calculi, focal lesion, or hydronephrosis. Bladder is unremarkable. Stomach/Bowel: Stomach is within normal limits. Appendix not clearly visualized. Bowel well opacified with oral contrast. No evidence of bowel wall thickening, distention, or inflammatory changes. Vascular/Lymphatic: No significant vascular findings are present. No enlarged abdominal or pelvic lymph nodes. Reproductive: Uterus and bilateral adnexa are unremarkable. Other: Small fat containing umbilical hernia. No ascites. Mild diffuse anasarca of the abdomen, pelvis and visualized proximal lower extremities. Musculoskeletal: No acute or significant osseous findings. IMPRESSION: 1. Findings of fluid overload including small bilateral pleural effusions, small-to-moderate pericardial effusion, and mild diffuse anasarca  of the abdomen and pelvis. 2. No acute abdominopelvic findings. Electronically Signed   By: NDavina PokeM.D.   On: 04/05/2019 14:52   Ct Abdomen Pelvis W Contrast  Result Date: 04/05/2019 CLINICAL DATA:  Weakness, anasarca, dyspnea EXAM: CT CHEST, ABDOMEN, AND PELVIS WITH CONTRAST TECHNIQUE: Multidetector CT imaging of the chest, abdomen and pelvis was performed following the standard protocol during bolus administration of intravenous contrast. CONTRAST:  1012mOMNIPAQUE IOHEXOL 300 MG/ML  SOLN COMPARISON:  None. FINDINGS: CT CHEST FINDINGS Cardiovascular: Mild cardiomegaly. Small to moderate pericardial effusion. Thoracic aorta is nonaneurysmal. No acute vascular findings. Mediastinum/Nodes: No axillary, mediastinal, or hilar lymphadenopathy. Unremarkable thyroid, trachea, and esophagus. Lungs/Pleura: Small bilateral pleural effusions, left slightly greater than right. Compressive atelectasis in the left lung base. Lungs are otherwise clear. No pneumothorax. Musculoskeletal: Multiple chronic healed left-sided rib fractures. No acute osseous findings. CT ABDOMEN PELVIS FINDINGS Hepatobiliary: No focal  liver abnormality is seen. No gallstones, gallbladder wall thickening, or biliary dilatation. Pancreas: Unremarkable. No pancreatic ductal dilatation or surrounding inflammatory changes. Spleen: Normal in size without focal abnormality. Adrenals/Urinary Tract: Adrenal glands are unremarkable. Kidneys are normal, without renal calculi, focal lesion, or hydronephrosis. Bladder is unremarkable. Stomach/Bowel: Stomach is within normal limits. Appendix not clearly visualized. Bowel well opacified with oral contrast. No evidence of bowel wall thickening, distention, or inflammatory changes. Vascular/Lymphatic: No significant vascular findings are present. No enlarged abdominal or pelvic lymph nodes. Reproductive: Uterus and bilateral adnexa are unremarkable. Other: Small fat containing umbilical hernia. No ascites.  Mild diffuse anasarca of the abdomen, pelvis and visualized proximal lower extremities. Musculoskeletal: No acute or significant osseous findings. IMPRESSION: 1. Findings of fluid overload including small bilateral pleural effusions, small-to-moderate pericardial effusion, and mild diffuse anasarca of the abdomen and pelvis. 2. No acute abdominopelvic findings. Electronically Signed   By: Davina Poke M.D.   On: 04/05/2019 14:52   US Abdomen Limited  Result Date: 04/07/2019 CLINICAL DATA:  Anasarca, EXAM: ULTRASOUND ABDOMEN LIMITED and ascites search COMPARISON:  CT chest, abdomen and pelvis 04/05/2019 FINDINGS: Multiplanar, realtime, grayscale and limited color Doppler imaging of the right upper quadrant was performed. Four quadrant examination for ascites also was performed. Liver displays a coarsened echotexture with question of contour nodularity and mildly heterogeneous appearance. There is hepatopetal flow demonstrated in the portal vein. Common bile duct is at upper limits of normal at approximately 6 mm. No signs of cholelithiasis. Mild gallbladder wall thickening at 3 mm with trace amount of pericholecystic fluid. Limited imaging of the inferior vena cava is unremarkable. Midline structures are obscured by overlying bowel gas. Limited images of the right kidney displays no signs of hydronephrosis. Small left-sided pleural effusion. IMPRESSION: 1. Coarsened echotexture with areas of variable echogenicity may represent early signs of liver disease with fatty infiltration correlate for clinical or laboratory evidence of liver disease. 2. Nonspecific appearance of the gallbladder with mildly thickened wall and trace pericholecystic fluid, correlate with any clinical signs of cholecystitis but is likely related to anasarca and potential liver disease. 3. Small left pleural effusion without ultrasound evidence of ascites at this time. Electronically Signed   By: Zetta Bills M.D.   On: 04/07/2019 11:30    Dg Chest Port 1 View  Result Date: 04/05/2019 CLINICAL DATA:  Shortness of breath EXAM: PORTABLE CHEST 1 VIEW COMPARISON:  CT from earlier in the same day. FINDINGS: Progressive left basilar infiltrate is noted with small left pleural effusion. Tiny right pleural effusion is seen. No pneumothorax is noted. Cardiac shadow is enlarged. No bony abnormality is seen. IMPRESSION: Increasing left basilar infiltrate and effusion. Tiny right effusion. Electronically Signed   By: Inez Catalina M.D.   On: 04/05/2019 21:25               Blood pressure 107/75, pulse 77, temperature 98.6 F (37 C), temperature source Oral, resp. rate 20, weight 61.7 kg, SpO2 97 %.  Physical exam:   General--talkative white female in no acute distress ENT--nonicteric Neck--no lymphadenopathy or thyroidomegaly Heart--regular rate and rhythm no gross murmur Lungs--clear with no wheezing or rales Abdomen--nondistended and soft with no detectable ascites liver is nonpalpable spleen is nonpalpable Psych--alert and oriented answers questions appropriately mood and affect are appropriate. Extremities- 1+ pitting edema somewhat tender in both lower extremities up to the level of the hips  Assessment: 1.  Bilateral lower extremity edema.  This is really quite confusing.  From a GI  perspective I doubt that liver disease is playing a part in her lower extremity edema.  Her albumin is not really that low relative to most patients with cirrhosis and on multiple studies she has no ascitic fluid in her abdomen.  The problem appears to be isolated to her lower extremities.  There is nothing to suggest pancreatic insufficiency or celiac disease. 2.  Pericardial effusion.  The etiology of this is unclear but certainly this has to be related.  She seems to be better with diuresis with Lasix.  The echocardiogram is not felt to be classic for amyloidosis.  Plan: Patient has already had a normal fecal qualitative fat level.  We will  obtain a celiac panel to look for signs of celiac disease.  If she develops any diarrhea or other GI symptoms we could try biopsy in the small bowel or colon to look for amyloid.   Nancy Fetter 04/07/2019, 12:06 PM   This note was created using voice recognition software and minor errors may Have occurred unintentionally. Pager: [phone] If no answer or after hours call [phone]

## 2019-04-07 NOTE — Progress Notes (Signed)
Cardiology Progress Note  Patient ID: Joanna Ali MRN: [id number] DOB: 1950-12-22 Date of Encounter: 04/07/2019  Primary Cardiologist: Shelva Majestic, MD  Subjective  Remains grossly volume overloaded due to diffuse anasarca.  Evaluated by hematology/oncology yesterday and they have a work-up planned.  She still complains of pain in her lower extremities.  She is making good urine output.  ROS:  All other ROS reviewed and negative. Pertinent positives noted in the HPI.     Inpatient Medications  Scheduled Meds:  carvedilol  12.5 mg Oral BID WC   enoxaparin (LOVENOX) injection  40 mg Subcutaneous Q24H   furosemide  40 mg Intravenous Q12H   lidocaine  1 patch Transdermal QHS   sodium chloride flush  3 mL Intravenous Q12H   Continuous Infusions:  sodium chloride     PRN Meds: sodium chloride, acetaminophen, hydrALAZINE, ondansetron (ZOFRAN) IV, sodium chloride flush   Vital Signs   Vitals:   04/06/19 1316 04/06/19 2109 04/07/19 0500 04/07/19 0542  BP: 105/65 98/63  94/66  Pulse: 76 83  77  Resp: 16 19  20   Temp: 98.1 F (36.7 C) 98.9 F (37.2 C) 98.7 F (37.1 C) 98.6 F (37 C)  TempSrc: Oral Oral Oral Oral  SpO2: 99% 96%  97%  Weight:    61.7 kg    Intake/Output Summary (Last 24 hours) at 04/07/2019 0822 Last data filed at 04/07/2019 0500 Gross per 24 hour  Intake 720 ml  Output 1850 ml  Net -1130 ml   Last 3 Weights 04/07/2019 04/06/2019 04/04/2019  Weight (lbs) 136 lb 0.4 oz 136 lb 1.6 oz 143 lb 8 oz  Weight (kg) 61.7 kg 61.735 kg 65.091 kg      Telemetry  Normal sinus rhythm with heart rate in the 70-80 range, which I personally reviewed  Physical Exam   Vitals:   04/06/19 1316 04/06/19 2109 04/07/19 0500 04/07/19 0542  BP: 105/65 98/63  94/66  Pulse: 76 83  77  Resp: 16 19  20   Temp: 98.1 F (36.7 C) 98.9 F (37.2 C) 98.7 F (37.1 C) 98.6 F (37 C)  TempSrc: Oral Oral Oral Oral  SpO2: 99% 96%  97%  Weight:    61.7 kg     Intake/Output  Summary (Last 24 hours) at 04/07/2019 0822 Last data filed at 04/07/2019 0500 Gross per 24 hour  Intake 720 ml  Output 1850 ml  Net -1130 ml    Last 3 Weights 04/07/2019 04/06/2019 04/04/2019  Weight (lbs) 136 lb 0.4 oz 136 lb 1.6 oz 143 lb 8 oz  Weight (kg) 61.7 kg 61.735 kg 65.091 kg    Body mass index is 24.1 kg/m.   General: Well nourished, well developed, in no acute distress Heart: Atraumatic, normal size  Eyes: PEERLA, EOMI  Neck: Supple, no JVD Endocrine: No thryomegaly Cardiac: Normal S1, S2; RRR Lungs: Crackles present bilaterally at the lung bases Abd: Soft, nontender, no hepatomegaly  Ext: 2+ pitting edema, she has significant thigh edema into the buttocks Musculoskeletal: No deformities, BUE and BLE strength normal and equal Skin: Warm and dry, no rashes   Neuro: Alert and oriented to person, place, time, and situation, CNII-XII grossly intact, no focal deficits  Psych: Normal mood and affect   Labs  High Sensitivity Troponin:   Recent Labs  Lab 04/06/19 0841  TROPONINIHS 10     Cardiac EnzymesNo results for input(s): TROPONINI in the last 168 hours. No results for input(s): TROPIPOC in the last 168 hours.  Chemistry Recent Labs  Lab 04/04/19 2000 04/05/19 0425 04/06/19 0424 04/07/19 0251  NA 133* 134* 134* 132*  K 3.2* 3.5 3.3* 4.0  CL 99 101 99 97*  CO2 21* 23 24 24   GLUCOSE 92 100* 83 108*  BUN 14 14 17 21   CREATININE 0.67 0.81 0.83 0.89  CALCIUM 8.4* 8.2* 8.2* 8.4*  PROT 4.9*  --   --   --   ALBUMIN 2.5*  --   --   --   AST 32  --   --   --   ALT 18  --   --   --   ALKPHOS 53  --   --   --   BILITOT 0.8  --   --   --   GFRNONAA >60 >60 >60 >60  GFRAA >60 >60 >60 >60  ANIONGAP 13 10 11 11     Hematology Recent Labs  Lab 04/04/19 2000  WBC 4.5  RBC 3.17*  HGB 10.2*  HCT 30.0*  MCV 94.6  MCH 32.2  MCHC 34.0  RDW 12.7  PLT 247   BNP Recent Labs  Lab 04/04/19 2000  BNP 76.7    DDimer No results for input(s): DDIMER in the last 168  hours.   Radiology  Ct Chest W Contrast  Result Date: 04/05/2019 CLINICAL DATA:  Weakness, anasarca, dyspnea EXAM: CT CHEST, ABDOMEN, AND PELVIS WITH CONTRAST TECHNIQUE: Multidetector CT imaging of the chest, abdomen and pelvis was performed following the standard protocol during bolus administration of intravenous contrast. CONTRAST:  133m OMNIPAQUE IOHEXOL 300 MG/ML  SOLN COMPARISON:  None. FINDINGS: CT CHEST FINDINGS Cardiovascular: Mild cardiomegaly. Small to moderate pericardial effusion. Thoracic aorta is nonaneurysmal. No acute vascular findings. Mediastinum/Nodes: No axillary, mediastinal, or hilar lymphadenopathy. Unremarkable thyroid, trachea, and esophagus. Lungs/Pleura: Small bilateral pleural effusions, left slightly greater than right. Compressive atelectasis in the left lung base. Lungs are otherwise clear. No pneumothorax. Musculoskeletal: Multiple chronic healed left-sided rib fractures. No acute osseous findings. CT ABDOMEN PELVIS FINDINGS Hepatobiliary: No focal liver abnormality is seen. No gallstones, gallbladder wall thickening, or biliary dilatation. Pancreas: Unremarkable. No pancreatic ductal dilatation or surrounding inflammatory changes. Spleen: Normal in size without focal abnormality. Adrenals/Urinary Tract: Adrenal glands are unremarkable. Kidneys are normal, without renal calculi, focal lesion, or hydronephrosis. Bladder is unremarkable. Stomach/Bowel: Stomach is within normal limits. Appendix not clearly visualized. Bowel well opacified with oral contrast. No evidence of bowel wall thickening, distention, or inflammatory changes. Vascular/Lymphatic: No significant vascular findings are present. No enlarged abdominal or pelvic lymph nodes. Reproductive: Uterus and bilateral adnexa are unremarkable. Other: Small fat containing umbilical hernia. No ascites. Mild diffuse anasarca of the abdomen, pelvis and visualized proximal lower extremities. Musculoskeletal: No acute or  significant osseous findings. IMPRESSION: 1. Findings of fluid overload including small bilateral pleural effusions, small-to-moderate pericardial effusion, and mild diffuse anasarca of the abdomen and pelvis. 2. No acute abdominopelvic findings. Electronically Signed   By: NDavina PokeM.D.   On: 04/05/2019 14:52   Ct Abdomen Pelvis W Contrast  Result Date: 04/05/2019 CLINICAL DATA:  Weakness, anasarca, dyspnea EXAM: CT CHEST, ABDOMEN, AND PELVIS WITH CONTRAST TECHNIQUE: Multidetector CT imaging of the chest, abdomen and pelvis was performed following the standard protocol during bolus administration of intravenous contrast. CONTRAST:  1014mOMNIPAQUE IOHEXOL 300 MG/ML  SOLN COMPARISON:  None. FINDINGS: CT CHEST FINDINGS Cardiovascular: Mild cardiomegaly. Small to moderate pericardial effusion. Thoracic aorta is nonaneurysmal. No acute vascular findings. Mediastinum/Nodes: No axillary, mediastinal, or hilar lymphadenopathy. Unremarkable  thyroid, trachea, and esophagus. Lungs/Pleura: Small bilateral pleural effusions, left slightly greater than right. Compressive atelectasis in the left lung base. Lungs are otherwise clear. No pneumothorax. Musculoskeletal: Multiple chronic healed left-sided rib fractures. No acute osseous findings. CT ABDOMEN PELVIS FINDINGS Hepatobiliary: No focal liver abnormality is seen. No gallstones, gallbladder wall thickening, or biliary dilatation. Pancreas: Unremarkable. No pancreatic ductal dilatation or surrounding inflammatory changes. Spleen: Normal in size without focal abnormality. Adrenals/Urinary Tract: Adrenal glands are unremarkable. Kidneys are normal, without renal calculi, focal lesion, or hydronephrosis. Bladder is unremarkable. Stomach/Bowel: Stomach is within normal limits. Appendix not clearly visualized. Bowel well opacified with oral contrast. No evidence of bowel wall thickening, distention, or inflammatory changes. Vascular/Lymphatic: No significant vascular  findings are present. No enlarged abdominal or pelvic lymph nodes. Reproductive: Uterus and bilateral adnexa are unremarkable. Other: Small fat containing umbilical hernia. No ascites. Mild diffuse anasarca of the abdomen, pelvis and visualized proximal lower extremities. Musculoskeletal: No acute or significant osseous findings. IMPRESSION: 1. Findings of fluid overload including small bilateral pleural effusions, small-to-moderate pericardial effusion, and mild diffuse anasarca of the abdomen and pelvis. 2. No acute abdominopelvic findings. Electronically Signed   By: Davina Poke M.D.   On: 04/05/2019 14:52   Dg Chest Port 1 View  Result Date: 04/05/2019 CLINICAL DATA:  Shortness of breath EXAM: PORTABLE CHEST 1 VIEW COMPARISON:  CT from earlier in the same day. FINDINGS: Progressive left basilar infiltrate is noted with small left pleural effusion. Tiny right pleural effusion is seen. No pneumothorax is noted. Cardiac shadow is enlarged. No bony abnormality is seen. IMPRESSION: Increasing left basilar infiltrate and effusion. Tiny right effusion. Electronically Signed   By: Inez Catalina M.D.   On: 04/05/2019 21:25   Vas [place] Lower Extremity Venous (dvt)  Result Date: 04/05/2019  Lower Venous Study Indications: Edema.  Comparison Study: no prior Performing Technologist: Abram Sander RVS  Examination Guidelines: A complete evaluation includes B-mode imaging, spectral Doppler, color Doppler, and power Doppler as needed of all accessible portions of each vessel. Bilateral testing is considered an integral part of a complete examination. Limited examinations for reoccurring indications may be performed as noted.  +---------+---------------+---------+-----------+----------+--------------+  RIGHT     Compressibility Phasicity Spontaneity Properties Thrombus Aging  +---------+---------------+---------+-----------+----------+--------------+  CFV       Full            Yes       Yes                                     +---------+---------------+---------+-----------+----------+--------------+  SFJ       Full                                                             +---------+---------------+---------+-----------+----------+--------------+  FV Prox   Full                                                             +---------+---------------+---------+-----------+----------+--------------+  FV Mid    Full                                                             +---------+---------------+---------+-----------+----------+--------------+  FV Distal Full                                                             +---------+---------------+---------+-----------+----------+--------------+  PFV       Full                                                             +---------+---------------+---------+-----------+----------+--------------+  POP       Full            Yes       Yes                                    +---------+---------------+---------+-----------+----------+--------------+  PTV       Full                                                             +---------+---------------+---------+-----------+----------+--------------+  PERO                                                       Not visualized  +---------+---------------+---------+-----------+----------+--------------+   +---------+---------------+---------+-----------+----------+--------------+  LEFT      Compressibility Phasicity Spontaneity Properties Thrombus Aging  +---------+---------------+---------+-----------+----------+--------------+  CFV       Full            Yes       Yes                                    +---------+---------------+---------+-----------+----------+--------------+  SFJ       Full                                                             +---------+---------------+---------+-----------+----------+--------------+  FV Prox   Full                                                              +---------+---------------+---------+-----------+----------+--------------+  FV Mid    Full                                                             +---------+---------------+---------+-----------+----------+--------------+  FV Distal Full                                                             +---------+---------------+---------+-----------+----------+--------------+  PFV       Full                                                             +---------+---------------+---------+-----------+----------+--------------+  POP       Full            Yes       Yes                                    +---------+---------------+---------+-----------+----------+--------------+  PTV       Full                                                             +---------+---------------+---------+-----------+----------+--------------+  PERO                                                       Not visualized  +---------+---------------+---------+-----------+----------+--------------+     Summary: Right: There is no evidence of deep vein thrombosis in the lower extremity. No cystic structure found in the popliteal fossa. Left: There is no evidence of deep vein thrombosis in the lower extremity. No cystic structure found in the popliteal fossa.  *See table(s) above for measurements and observations. Electronically signed by Harold Barban MD on 04/05/2019 at 4:31:10 PM.    Final     Cardiac Studies  TTE 04/06/2019  1. Left ventricular ejection fraction, by visual estimation, is 60 to 65%. The left ventricle has normal function. Normal left ventricular size. Left ventricular septal wall thickness was normal. Normal left ventricular posterior wall thickness. There  is no left ventricular hypertrophy.  2. Left ventricular diastolic Doppler parameters are consistent with pseudonormalization pattern of LV diastolic filling.  3. Global right ventricle has normal systolic function.The right ventricular size is normal. No increase  in right ventricular wall thickness.  4. Left atrial size was mildly dilated.  5. Right atrial size was normal.  6. Large pericardial effusion.  7. The pericardial effusion is circumferential.  8. There is inversion of the right atrial wall, excessive respiratory variation in the tricuspid valve spectral Doppler velocities and excessive respiratory variation in the mitral valve spectral Doppler velocities.  9. Pericardial effusion measuring up to 1.36 cm. Findings concerning for early tamponade physiology. 10. The mitral valve is normal in structure. Mild mitral valve regurgitation. No evidence of mitral stenosis. 11. The tricuspid valve is normal in structure. Tricuspid valve regurgitation is mild. 12. The aortic valve The aortic valve is tricuspid  Aortic valve regurgitation is moderate by color flow Doppler. Structurally normal aortic valve, with no evidence of sclerosis or stenosis. 13. The pulmonic valve was normal in structure. Pulmonic valve regurgitation is trivial by color flow Doppler. 14. Moderately elevated pulmonary artery systolic pressure. 15. The inferior vena cava is dilated in size with >50% respiratory variability, suggesting right atrial pressure of 8 mmHg. 16. Small patent foramen ovale with predominantly right to left shunting across the atrial septum.  Patient Profile  Joanna Ali is a 68 y.o. female with medical history significant for hypertension, who has had a rapidly progressive onset of anasarca and shortness of breath.  She was admitted for volume overload and further work-up on 9/15.  Assessment & Plan  #Anasarca/volume overload -Her work-up thus far has only revealed hypoalbuminemia as the etiology for her anasarca  -Her echocardiogram has a pericardial effusion, however this is inconsistent with a diagnosis of tamponade on echocardiography, and more importantly she has no evidence of tamponade clinically -The reported M spike from outside rheumatology work-up  was not seen here; hematology has seen her and has plan for a bit more work-up -All of her rheumatologic work-up including rheumatoid factor, anti-Jo/Ro, ANCA, ANA has been negative -SPEP was negative -Free light chains have been sent by hematology oncology -She does have low voltage on her EKG, however her echocardiogram is not consistent with a diagnosis of cardiac amyloidosis. -At this point, given that the only etiology for her diffuse anasarca is hypoalbuminemia, with extremely low cholesterol, I am concerned that there is a gastrointestinal etiology here.  Possibly a protein-losing enteropathy versus liver disease.  Her liver has been normal on CT scan, we will repeat a liver ultrasound today.  I will ask gastroenterology to evaluate her today -She does not appear to be losing protein in her urine -Today we will continue Lasix 40 mg twice daily with replacement of electrolytes as needed -I will reach out to hospital medicine team to take over her care as this appears to be a GI work-up.  We will need to follow her as a cardiology team in case she does eventually need a cardiac MRI, but at this time I would prefer a GI work-up first.  #Pericardial effusion, moderate, circumferential -This does not represent cardiac tamponade.  Her BNP and IVC are inconsistent with this diagnosis.  Clinically she has no evidence of this. 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  The valve does appear thickened, she is not not in acute heart failure  #Hypertension -Continue beta-blocker for now  # FEN - no IVF - regular diet - dvt ppx: lovenox -code: full   For questions or updates, please contact [place] Please consult [email or web address] for contact info under        Signed, [place] B[place] T. Audie Box, [place]  04/07/2019 8:22 AM

## 2019-04-07 NOTE — Progress Notes (Signed)
Consulted by cardiology to assume care in a.m. as primary.  Joanna Ali is a 68 year old female with pmh hypertension; who presented for evaluation of aortic valve insufficiency, pericardial effusion, and lower extremity edema.  Ultimately work-up has not shown a direct cardiac cause of patient's anasarca symptoms.  CT imaging noted chronic healed left-sided rib fractures.  Hematology consulted and working her up for other causes. There was concern for the possibility of multiple myeloma as previously noted to have a M spike of 0.1 from outside labs by rheumatologist, but repeat testing showed no M spike.   She has been diuresing with IV Lasix.  The only cause that can be found at this time is patient's hypoalbuminemia for which gastroenterology has been consulted.  Other findings include elevated TSH and free T4, but unclear how this factors into patient's presentation.

## 2019-04-09 LAB — CULTURE, BLOOD (ROUTINE X 2)
Culture: NO GROWTH
Culture: NO GROWTH
Special Requests: ADEQUATE
Special Requests: ADEQUATE

## 2019-04-10 ENCOUNTER — Telehealth: Payer: Self-pay | Admitting: Cardiovascular Disease

## 2019-04-10 DIAGNOSIS — R601 Generalized edema: Secondary | ICD-10-CM

## 2019-04-10 NOTE — Telephone Encounter (Signed)
Routed to primary cardiologist Dr. Claiborne Billings and Dr. Audie Box who rounded on patient in the hospital to advise on request for referral

## 2019-04-10 NOTE — Telephone Encounter (Signed)
I have ordered the referral- as external. Will manually fax over to [place] at [phone].  It was placed as a urgent referral.

## 2019-04-10 NOTE — Addendum Note (Signed)
Addended by: Caprice Beaver T on: 04/10/2019 04:26 PM   Modules accepted: Orders

## 2019-04-10 NOTE — Telephone Encounter (Signed)
-----   Message from Geralynn Rile, MD sent at 04/10/2019 4:07 PM EDT -----  Sharon/Julie:    Can you guys help with this? I have no idea how to get her referred to this group in [place]. The reason for referral would be "anasarca".     -W

## 2019-04-10 NOTE — Telephone Encounter (Signed)
Husband of patient called today. Husband would like Dr. Audie Box to send a referral to a GI Doctor on behalf of his wife.  The patient would like to go to [facility] to be treated by  Eula Listen. You can contact Dr. Caren Macadam nurse Wandra Feinstein directly at [phone]. Husband would like this done as soon as possible. Patient will need a liver biopsy done before the end of the week.

## 2019-04-11 LAB — KAPPA/LAMBDA LIGHT CHAINS
Kappa free light chain: 34.3 mg/L — ABNORMAL HIGH (ref 3.3–19.4)
Kappa, lambda light chain ratio: 1.35 (ref 0.26–1.65)
Lambda free light chains: 25.5 mg/L (ref 5.7–26.3)

## 2019-04-13 ENCOUNTER — Telehealth: Payer: Self-pay | Admitting: Cardiovascular Disease

## 2019-04-13 DIAGNOSIS — R601 Generalized edema: Secondary | ICD-10-CM

## 2019-04-13 NOTE — Telephone Encounter (Signed)
Sent Urgent referral to [facility]. Advising that patient was told if he got a referral sent over they could try to get him in-  Advised with scheduling to make sure referral was sent as well since it was external.

## 2019-04-13 NOTE — Telephone Encounter (Signed)
New message   Patient's husband states that need a referral to [facility] for some issues that his wife is having. Please call to discuss this referral.

## 2019-04-13 NOTE — Telephone Encounter (Signed)
Spoke to Patient's husband  they would like an Urgent referral to [facility] - THEY DO NOT WANT TO GO TO [place]  ANYMORE BECAUSE THEY ARE  NOT  ABLE TO GET AN APPOINTMENT WITH  DOCTOR UNTIL NEXT WEEK. HUSBAND STATES  PATIENT IS INCREASING PAIN AND SWELLING FORM HIPS TO HER FEET,  Husband states he called [facility] and was told he would need an referral. Husband states Dr Claiborne Billings and Dr Audie Box was aware of patient 's issues . Recently in the hospital.  husband is aware will defer to Dr Claiborne Billings

## 2019-04-13 NOTE — Telephone Encounter (Signed)
Contacted Jackie at Dr. Malissa Hippo Berg's office to schedule patient.  Faxed records to Dr. Doylene Canard office.  They will review records and call patient to schedule appt.

## 2019-04-14 ENCOUNTER — Telehealth: Payer: Self-pay | Admitting: Cardiovascular Disease

## 2019-04-14 NOTE — Telephone Encounter (Signed)
[place] and talked to Manuela Schwartz regarding referral for Dr. Merrilee Jansky.  They have received the records and will be calling the patient.

## 2019-04-17 ENCOUNTER — Telehealth: Payer: Self-pay | Admitting: Cardiovascular Disease

## 2019-04-17 NOTE — Telephone Encounter (Signed)
Called Dr. Doylene Canard office and talked with Almyra Free.  She states that the patient can be seen by anyone in Dr. Doylene Canard group.  Almyra Free states she will call the patient and see if she would like to be scheduled.

## 2019-04-18 ENCOUNTER — Encounter: Payer: Self-pay | Admitting: Cardiovascular Disease

## 2019-04-19 ENCOUNTER — Telehealth: Payer: Self-pay | Admitting: Cardiovascular Disease

## 2019-04-19 NOTE — Telephone Encounter (Signed)
Called Dr. Caren Macadam office to inquire about appointment.  The patient cancelled the appointment which was scheduled for her on 04-19-19 and did not reschedule.

## 2019-04-19 NOTE — Telephone Encounter (Signed)
04-14-2019.  Called office and talked with Manuela Schwartz.  Office received the referral and will reach out to the patient to schedule.   04-17-19 Called office and talked to Almyra Free who will call the patient to schedule an emergent appointment. 04-09-19.  Called the office and talked with [facility].  The office spoke with the patient and the husband and the husband.  The patient was offered an emergent telehealth visit and/or office visit and the patient and husband declined the appointment.

## 2019-04-24 MED ORDER — ONDANSETRON HCL 4 MG/2ML IJ SOLN
4.00 | INTRAMUSCULAR | Status: DC
Start: ? — End: 2019-04-24

## 2019-04-24 MED ORDER — ENOXAPARIN SODIUM 40 MG/0.4ML ~~LOC~~ SOLN
40.00 | SUBCUTANEOUS | Status: DC
Start: 2019-04-24 — End: 2019-04-24

## 2019-04-24 MED ORDER — SPIRONOLACTONE 25 MG PO TABS
100.00 | ORAL_TABLET | ORAL | Status: DC
Start: 2019-04-24 — End: 2019-04-24

## 2019-04-24 MED ORDER — LIDOCAINE HCL 1 % IJ SOLN
0.50 | INTRAMUSCULAR | Status: DC
Start: ? — End: 2019-04-24

## 2019-04-24 MED ORDER — POLYETHYLENE GLYCOL 3350 17 G PO PACK
17.00 | PACK | ORAL | Status: DC
Start: ? — End: 2019-04-24

## 2019-04-24 MED ORDER — SENNOSIDES-DOCUSATE SODIUM 8.6-50 MG PO TABS
2.00 | ORAL_TABLET | ORAL | Status: DC
Start: 2019-04-23 — End: 2019-04-24

## 2019-04-24 MED ORDER — ACETAMINOPHEN 325 MG PO TABS
500.00 | ORAL_TABLET | ORAL | Status: DC
Start: 2019-04-23 — End: 2019-04-24

## 2019-04-24 MED ORDER — METOCLOPRAMIDE HCL 5 MG/ML IJ SOLN
10.00 | INTRAMUSCULAR | Status: DC
Start: ? — End: 2019-04-24

## 2019-04-24 MED ORDER — TORSEMIDE 20 MG PO TABS
40.00 | ORAL_TABLET | ORAL | Status: DC
Start: 2019-04-24 — End: 2019-04-24

## 2019-04-24 MED ORDER — TRAMADOL HCL 50 MG PO TABS
25.00 | ORAL_TABLET | ORAL | Status: DC
Start: ? — End: 2019-04-24

## 2019-05-18 ENCOUNTER — Telehealth: Payer: Self-pay | Admitting: Cardiovascular Disease

## 2019-05-18 NOTE — Telephone Encounter (Signed)
Patient wanted to make sure her Husband could come in with her to her appointment with Dr. Audie Box on 11/17. She is in a wheelchair. She also has periods of unpredictable, excruciating pain, which prevent her from speaking temporarily. The pain does not last long, but if it were to happen while in the office, she would need her husband to advocate for her. She states her husband has been able to come with her to see Dr. Claiborne Billings recently, but still wants to follow the rules and ask. Please advise if her husband is or is not able to come

## 2019-05-18 NOTE — Telephone Encounter (Signed)
Husband ok to come. -W

## 2019-05-18 NOTE — Telephone Encounter (Signed)
Patient made aware and verbalized her understanding. 

## 2019-05-19 ENCOUNTER — Ambulatory Visit: Payer: Medicare Other | Admitting: Cardiovascular Disease

## 2019-06-05 NOTE — Progress Notes (Signed)
Cardiology Office Note:   Date:  06/06/2019  NAME:  Joanna Ali    MRN: [id number] DOB:  01/06/1951   PCP:  Octavia Bruckner, MD  Cardiologist:  Evalina Field, MD   Referring MD: Octavia Bruckner, MD   Chief Complaint  Patient presents with   Pericardial Effusion   History of Present Illness:   Joanna Ali is a 68 y.o. female with a hx of HTN, osteoarthritis, pericardial effusion, anasarca 2/2 liver disease who is being seen today for follow-up of aortic regurgitation. She was admitted to  9/15-9/18 for volume overload and diuresed. Work-up was negative for venous obstruction or cardiac pathology to explain symptoms. She was recently admitted to [facility] who felt her anasarca was related to alcoholic steatosis.  She had an extensive rheumatologic work-up by me that was negative.  We also performed a transesophageal echocardiogram without endocarditis.  She does have moderate aortic insufficiency with this is not the etiology of her issues.  Regarding her pericardial effusion, her IVC is not plethoric and is collapsible.  Her right heart has no evidence of collapse either.  On examination today she has no evidence of a dilated neck veins or pulses paradoxus.  She actually underwent a liver biopsy at [facility] and just showed steatosis without any evidence of cirrhosis.  They report that her gastroenterologist at [facility] feels that her anasarca is out of proportion to the degree of her liver disease.  She will see a rheumatologist as well as cardiologist at [facility] to get a second opinion everything.  I have encouraged him to do this as our work-up really has been negative.  She reports she still gets lower extremity edema, but this is much improved with Aldactone and torsemide.  Her blood pressures been well controlled with medications.  She still has considerable neuropathic pain and was evaluated by neurologist who was not that helpful.  They did start her on gabapentin with improvement in symptoms.   Overall I still have concerns for some sort of smoldering process that we just not identified yet.  I do feel the pericardial effusion is likely secondary bystander in this and not the primary issue.  Past Medical History: Past Medical History:  Diagnosis Date   Hypertension    Leg swelling 03/2019   Short of breath on exertion 03/2019    Past Surgical History: Past Surgical History:  Procedure Laterality Date   APPENDECTOMY     NECK SURGERY     TEE WITHOUT CARDIOVERSION N/A 03/28/2019   Procedure: TRANSESOPHAGEAL ECHOCARDIOGRAM (TEE);  Surgeon: Geralynn Rile, MD;  Location: [place];  Service: Cardiology;  Laterality: N/A;    Current Medications: Current Meds  Medication Sig   gabapentin (NEURONTIN) 300 MG capsule Take by mouth.   Multiple Vitamins-Minerals (PRESERVISION AREDS 2) CAPS Take 1 capsule by mouth 2 (two) times daily.    sertraline (ZOLOFT) 25 MG tablet Take by mouth.   spironolactone (ALDACTONE) 100 MG tablet Take by mouth.   torsemide (DEMADEX) 20 MG tablet Take by mouth.   traMADol (ULTRAM) 50 MG tablet TAKE 1 TABLET BY MOUTH EVERY 6 HOURS AS NEEDED FOR PAIN FOR 10 DAYS   TURMERIC PO Take 1 capsule by mouth 2 (two) times daily.     Allergies:    Patient has no known allergies.   Social History: Social History   Socioeconomic History   Marital status: Married    Spouse name: Not on file   Number of children: Not on file  Years of education: Not on file   Highest education level: Not on file  Occupational History   Not on file  Social Needs   Financial resource strain: Not on file   Food insecurity    Worry: Not on file    Inability: Not on file   Transportation needs    Medical: Not on file    Non-medical: Not on file  Tobacco Use   Smoking status: Former Smoker    Packs/day: 0.50    Years: 10.00    Pack years: 5.00    Types: Cigarettes    Quit date: 03/20/1979    Years since quitting: 40.2   Smokeless  tobacco: Never Used   Tobacco comment: pt states she smoked from her early 71s to early 7s.  Substance and Sexual Activity   Alcohol use: No   Drug use: No   Sexual activity: Not on file  Lifestyle   Physical activity    Days per week: Not on file    Minutes per session: Not on file   Stress: Not on file  Relationships   Social connections    Talks on phone: Not on file    Gets together: Not on file    Attends religious service: Not on file    Active member of club or organization: Not on file    Attends meetings of clubs or organizations: Not on file    Relationship status: Not on file  Other Topics Concern   Not on file  Social History Narrative   Not on file     Family History: The patient's family history includes Hypertension in her mother.  ROS:   All other ROS reviewed and negative. Pertinent positives noted in the HPI.     EKGs/Labs/Other Studies Reviewed:   The following studies were personally reviewed by me today:  TTE 04/05/2019  1. Left ventricular ejection fraction, by visual estimation, is 60 to 65%. The left ventricle has normal function. Normal left ventricular size. Left ventricular septal wall thickness was normal. Normal left ventricular posterior wall thickness. There  is no left ventricular hypertrophy.  2. Left ventricular diastolic Doppler parameters are consistent with pseudonormalization pattern of LV diastolic filling.  3. Global right ventricle has normal systolic function.The right ventricular size is normal. No increase in right ventricular wall thickness.  4. Left atrial size was mildly dilated.  5. Right atrial size was normal.  6. Large pericardial effusion.  7. The pericardial effusion is circumferential.  8. There is inversion of the right atrial wall, excessive respiratory variation in the tricuspid valve spectral Doppler velocities and excessive respiratory variation in the mitral valve spectral Doppler velocities.  9.  Pericardial effusion measuring up to 1.36 cm. Findings concerning for early tamponade physiology. 10. The mitral valve is normal in structure. Mild mitral valve regurgitation. No evidence of mitral stenosis. 11. The tricuspid valve is normal in structure. Tricuspid valve regurgitation is mild. 12. The aortic valve The aortic valve is tricuspid Aortic valve regurgitation is moderate by color flow Doppler. Structurally normal aortic valve, with no evidence of sclerosis or stenosis. 13. The pulmonic valve was normal in structure. Pulmonic valve regurgitation is trivial by color flow Doppler. 14. Moderately elevated pulmonary artery systolic pressure. 15. The inferior vena cava is dilated in size with >50% respiratory variability, suggesting right atrial pressure of 8 mmHg. 16. Small patent foramen ovale with predominantly right to left shunting across the atrial septum.  TEE 03/28/2019  1. No evidence of  infective endocarditis. Moderate AI that will need periodic surveillance.  2. A moderate degree of aortic regurgitation is present. The valve is thickened and the jet is centrally directed. The 2D vena contracta is 0.34 cm. 3D ERO computed to 0.17 cm2. Overall, solidly moderate AI.  3. The right ventricle has normal systolc function. The cavity was normal. There is no increase in right ventricular wall thickness.  4. Moderate pericardial effusion.  5. No evidence of tamponade.  6. The pericardial effusion is circumferential.  7. The mitral valve is grossly normal. Mild thickening of the mitral valve leaflet.  8. The tricuspid valve was grossly normal.  9. The aortic valve is tricuspid Mild thickening of the aortic valve. Aortic valve regurgitation is moderate by color flow Doppler. No stenosis of the aortic valve. 10. The aortic root, ascending aorta, aortic arch and descending aorta are normal in size and structure. 11. The left ventricle has normal systolic function, with an ejection fraction of  60-65%.  Recent Labs: 04/04/2019: ALT 18; B Natriuretic Peptide 76.7; Hemoglobin 10.2; Platelets 247 04/07/2019: BUN 21; Creatinine, Ser 0.89; Magnesium 2.0; Potassium 4.0; Sodium 132; TSH 4.565   Recent Lipid Panel    Component Value Date/Time   CHOL 74 04/06/2019 0841   TRIG 72 04/06/2019 0841   HDL 27 (L) 04/06/2019 0841   CHOLHDL 2.7 04/06/2019 0841   VLDL 14 04/06/2019 0841   LDLCALC 33 04/06/2019 0841    Physical Exam:   VS:  BP 130/85    Pulse 98    Temp (!) 97 F (36.1 C)    Ht 5' 3"  (1.6 m)    Wt 121 lb 12.8 oz (55.2 kg)    SpO2 98%    BMI 21.58 kg/m    Wt Readings from Last 3 Encounters:  06/06/19 121 lb 12.8 oz (55.2 kg)  04/07/19 136 lb 0.4 oz (61.7 kg)  04/04/19 142 lb (64.4 kg)    General: Well nourished, well developed, in no acute distress Heart: Atraumatic, normal size  Eyes: PEERLA, EOMI  Neck: Supple, no JVD Endocrine: No thryomegaly Cardiac: Normal S1, S2; RRR; no murmurs, rubs, or gallops Lungs: Clear to auscultation bilaterally, no wheezing, rhonchi or rales  Abd: Soft, nontender, no hepatomegaly  Ext: 2+ pitting edema up to the knees, venous insufficiency changes noted, extremities feel warm to touch Musculoskeletal: No deformities, BUE and BLE strength normal and equal Skin: Venous insufficiency changes noted lower extremity Neuro: Alert and oriented to person, place, time, and situation, CNII-XII grossly intact, no focal deficits  Psych: Normal mood and affect   ASSESSMENT:   Joanna Ali is a 68 y.o. female who presents for the following: 1. Anasarca   2. Pericardial effusion   3. Nonrheumatic aortic valve insufficiency   4. Essential hypertension     PLAN:   1. Anasarca 2. Pericardial effusion -She is had extensive rheumatologic work-up that has been negative.  She also was noted to have a low albumin and underwent an EGD and biopsy at [facility].  That was negative for amyloidosis.  Her stool studies were also negative for any protein-losing  enteropathy.  She underwent a liver biopsy that showed hepatic steatosis, but no evidence of cirrhosis.  A CT scan at [facility] did not reveal any obstruction.  DVT study showed no evidence of venous obstruction either.  Her echocardiogram shows a moderate pericardial effusion however the IVC is collapsible and not dilated.  There is no evidence of RV collapse.  She has no  clinical symptoms of cardiac tamponade.  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  I am really perplexed at what the etiology of this will be.  I do agree with her gastroenterologist at [facility] that she needs a fresh set of eyes on this.  She will see rheumatology as well as cardiology at [facility].  We will plan to see her back in 6 months after this.  For now we will continue with the Aldactone and torsemide as this is controlling her swelling.  I really do not understand what is causing her to be so sensitive to touch and pain.  She has considerable neuropathic symptoms with this.  Overall I think there is some rheumatologic issue or systemic illness that has not revealed itself yet.  We will await the secondary evaluation at [facility] with rheumatology and cardiology.  I will see her back in 6 months after this.  3. Nonrheumatic aortic valve insufficiency -Moderate aortic insufficiency on a transesophageal echocardiogram in September of this year.  We will plan to repeat an echocardiogram in 2 to 3 years.  Appears to be related to degenerative aortic valve disease.  4. Essential hypertension -Well-controlled on current medications   Disposition: Return in about 6 months (around 12/04/2019).  Medication Adjustments/Labs and Tests Ordered: Current medicines are reviewed at length with the patient today.  Concerns regarding medicines are outlined above.  No orders of the defined types were placed in this encounter.  No orders of the defined types were placed in  this encounter.   Patient Instructions  Medication Instructions:  The current medical regimen is effective;  continue present plan and medications.  *If you need a refill on your cardiac medications before your next appointment, please call your pharmacy*  Follow-Up: At [facility], you and your health needs are our priority.  As part of our continuing mission to provide you with exceptional heart care, we have created designated Provider Care Teams.  These Care Teams include your primary Cardiologist (physician) and Advanced Practice Providers (APPs -  Physician Assistants and Nurse Practitioners) who all work together to provide you with the care you need, when you need it.  Your next appointment:   6 months  The format for your next appointment:   In Person  Provider:   Eleonore Chiquito, MD       Signed, Addison Naegeli. Audie Box, [place]  [address] [place],  [address] [phone]  06/06/2019 10:21 AM

## 2019-06-06 ENCOUNTER — Other Ambulatory Visit: Payer: Self-pay

## 2019-06-06 ENCOUNTER — Encounter: Payer: Self-pay | Admitting: Cardiovascular Disease

## 2019-06-06 ENCOUNTER — Ambulatory Visit (INDEPENDENT_AMBULATORY_CARE_PROVIDER_SITE_OTHER): Payer: Medicare Other | Admitting: Cardiovascular Disease

## 2019-06-06 VITALS — BP 130/85 | HR 98 | Temp 97.0°F | Ht 63.0 in | Wt 121.8 lb

## 2019-06-06 DIAGNOSIS — I313 Pericardial effusion (noninflammatory): Secondary | ICD-10-CM

## 2019-06-06 DIAGNOSIS — I10 Essential (primary) hypertension: Secondary | ICD-10-CM

## 2019-06-06 DIAGNOSIS — R601 Generalized edema: Secondary | ICD-10-CM

## 2019-06-06 DIAGNOSIS — I351 Nonrheumatic aortic (valve) insufficiency: Secondary | ICD-10-CM

## 2019-06-06 DIAGNOSIS — I3139 Other pericardial effusion (noninflammatory): Secondary | ICD-10-CM

## 2019-06-06 NOTE — Patient Instructions (Signed)
Medication Instructions:  The current medical regimen is effective;  continue present plan and medications.  *If you need a refill on your cardiac medications before your next appointment, please call your pharmacy*  Follow-Up: At [facility], you and your health needs are our priority.  As part of our continuing mission to provide you with exceptional heart care, we have created designated Provider Care Teams.  These Care Teams include your primary Cardiologist (physician) and Advanced Practice Providers (APPs -  Physician Assistants and Nurse Practitioners) who all work together to provide you with the care you need, when you need it.  Your next appointment:   6 months  The format for your next appointment:   In Person  Provider:   Eleonore Chiquito, MD

## 2019-06-20 DIAGNOSIS — I313 Pericardial effusion (noninflammatory): Secondary | ICD-10-CM

## 2019-06-20 DIAGNOSIS — I3139 Other pericardial effusion (noninflammatory): Secondary | ICD-10-CM

## 2019-06-20 HISTORY — DX: Pericardial effusion (noninflammatory): I31.3

## 2019-06-20 HISTORY — DX: Other pericardial effusion (noninflammatory): I31.39

## 2019-07-03 ENCOUNTER — Emergency Department (HOSPITAL_COMMUNITY): Payer: Medicare Other

## 2019-07-03 ENCOUNTER — Other Ambulatory Visit: Payer: Self-pay

## 2019-07-03 ENCOUNTER — Encounter: Payer: Self-pay | Admitting: Cardiovascular Disease

## 2019-07-03 ENCOUNTER — Telehealth: Payer: Self-pay | Admitting: Cardiovascular Disease

## 2019-07-03 ENCOUNTER — Ambulatory Visit (INDEPENDENT_AMBULATORY_CARE_PROVIDER_SITE_OTHER): Payer: Medicare Other | Admitting: Cardiovascular Disease

## 2019-07-03 ENCOUNTER — Inpatient Hospital Stay (HOSPITAL_COMMUNITY)
Admission: EM | Admit: 2019-07-03 | Discharge: 2019-08-23 | DRG: 270 | Disposition: A | Discharge status: Rehabilitation Facility | Payer: Medicare Other | Source: Ambulatory Visit | Attending: Internal Medicine | Admitting: Internal Medicine

## 2019-07-03 ENCOUNTER — Encounter (HOSPITAL_COMMUNITY): Payer: Self-pay | Admitting: Emergency Medicine

## 2019-07-03 VITALS — BP 136/94 | HR 96 | Temp 97.9°F | Ht 66.0 in | Wt 117.0 lb

## 2019-07-03 DIAGNOSIS — I120 Hypertensive chronic kidney disease with stage 5 chronic kidney disease or end stage renal disease: Secondary | ICD-10-CM | POA: Diagnosis not present

## 2019-07-03 DIAGNOSIS — T508X5A Adverse effect of diagnostic agents, initial encounter: Secondary | ICD-10-CM | POA: Diagnosis not present

## 2019-07-03 DIAGNOSIS — G9341 Metabolic encephalopathy: Secondary | ICD-10-CM | POA: Diagnosis not present

## 2019-07-03 DIAGNOSIS — I700 Atherosclerosis of aorta: Secondary | ICD-10-CM | POA: Diagnosis present

## 2019-07-03 DIAGNOSIS — R600 Localized edema: Secondary | ICD-10-CM | POA: Diagnosis not present

## 2019-07-03 DIAGNOSIS — L89152 Pressure ulcer of sacral region, stage 2: Secondary | ICD-10-CM | POA: Diagnosis not present

## 2019-07-03 DIAGNOSIS — R109 Unspecified abdominal pain: Secondary | ICD-10-CM

## 2019-07-03 DIAGNOSIS — E870 Hyperosmolality and hypernatremia: Secondary | ICD-10-CM | POA: Diagnosis not present

## 2019-07-03 DIAGNOSIS — Q211 Atrial septal defect: Secondary | ICD-10-CM

## 2019-07-03 DIAGNOSIS — I4891 Unspecified atrial fibrillation: Secondary | ICD-10-CM

## 2019-07-03 DIAGNOSIS — E43 Unspecified severe protein-calorie malnutrition: Secondary | ICD-10-CM | POA: Diagnosis present

## 2019-07-03 DIAGNOSIS — G629 Polyneuropathy, unspecified: Secondary | ICD-10-CM | POA: Diagnosis present

## 2019-07-03 DIAGNOSIS — I361 Nonrheumatic tricuspid (valve) insufficiency: Secondary | ICD-10-CM

## 2019-07-03 DIAGNOSIS — J95811 Postprocedural pneumothorax: Secondary | ICD-10-CM | POA: Diagnosis not present

## 2019-07-03 DIAGNOSIS — M19042 Primary osteoarthritis, left hand: Secondary | ICD-10-CM | POA: Diagnosis present

## 2019-07-03 DIAGNOSIS — N179 Acute kidney failure, unspecified: Secondary | ICD-10-CM

## 2019-07-03 DIAGNOSIS — E875 Hyperkalemia: Secondary | ICD-10-CM | POA: Diagnosis not present

## 2019-07-03 DIAGNOSIS — J9601 Acute respiratory failure with hypoxia: Secondary | ICD-10-CM | POA: Diagnosis present

## 2019-07-03 DIAGNOSIS — M340 Progressive systemic sclerosis: Secondary | ICD-10-CM

## 2019-07-03 DIAGNOSIS — R7989 Other specified abnormal findings of blood chemistry: Secondary | ICD-10-CM | POA: Diagnosis present

## 2019-07-03 DIAGNOSIS — R64 Cachexia: Secondary | ICD-10-CM | POA: Diagnosis present

## 2019-07-03 DIAGNOSIS — M4819 Ankylosing hyperostosis [Forestier], multiple sites in spine: Secondary | ICD-10-CM | POA: Diagnosis present

## 2019-07-03 DIAGNOSIS — R0902 Hypoxemia: Secondary | ICD-10-CM | POA: Diagnosis present

## 2019-07-03 DIAGNOSIS — E871 Hypo-osmolality and hyponatremia: Secondary | ICD-10-CM | POA: Diagnosis not present

## 2019-07-03 DIAGNOSIS — M549 Dorsalgia, unspecified: Secondary | ICD-10-CM | POA: Diagnosis not present

## 2019-07-03 DIAGNOSIS — R0600 Dyspnea, unspecified: Secondary | ICD-10-CM

## 2019-07-03 DIAGNOSIS — I3139 Other pericardial effusion (noninflammatory): Secondary | ICD-10-CM | POA: Diagnosis present

## 2019-07-03 DIAGNOSIS — M069 Rheumatoid arthritis, unspecified: Secondary | ICD-10-CM

## 2019-07-03 DIAGNOSIS — Z9889 Other specified postprocedural states: Secondary | ICD-10-CM

## 2019-07-03 DIAGNOSIS — D696 Thrombocytopenia, unspecified: Secondary | ICD-10-CM

## 2019-07-03 DIAGNOSIS — E874 Mixed disorder of acid-base balance: Secondary | ICD-10-CM | POA: Diagnosis not present

## 2019-07-03 DIAGNOSIS — Z87891 Personal history of nicotine dependence: Secondary | ICD-10-CM

## 2019-07-03 DIAGNOSIS — R1310 Dysphagia, unspecified: Secondary | ICD-10-CM

## 2019-07-03 DIAGNOSIS — H3530 Unspecified macular degeneration: Secondary | ICD-10-CM | POA: Diagnosis present

## 2019-07-03 DIAGNOSIS — Z992 Dependence on renal dialysis: Secondary | ICD-10-CM

## 2019-07-03 DIAGNOSIS — L89812 Pressure ulcer of head, stage 2: Secondary | ICD-10-CM | POA: Diagnosis not present

## 2019-07-03 DIAGNOSIS — I429 Cardiomyopathy, unspecified: Secondary | ICD-10-CM | POA: Diagnosis present

## 2019-07-03 DIAGNOSIS — R5381 Other malaise: Secondary | ICD-10-CM

## 2019-07-03 DIAGNOSIS — R0602 Shortness of breath: Secondary | ICD-10-CM

## 2019-07-03 DIAGNOSIS — Z681 Body mass index (BMI) 19 or less, adult: Secondary | ICD-10-CM

## 2019-07-03 DIAGNOSIS — R778 Other specified abnormalities of plasma proteins: Secondary | ICD-10-CM | POA: Diagnosis present

## 2019-07-03 DIAGNOSIS — M341 CR(E)ST syndrome: Secondary | ICD-10-CM

## 2019-07-03 DIAGNOSIS — E162 Hypoglycemia, unspecified: Secondary | ICD-10-CM | POA: Diagnosis not present

## 2019-07-03 DIAGNOSIS — L8990 Pressure ulcer of unspecified site, unspecified stage: Secondary | ICD-10-CM | POA: Diagnosis present

## 2019-07-03 DIAGNOSIS — E8779 Other fluid overload: Secondary | ICD-10-CM | POA: Diagnosis not present

## 2019-07-03 DIAGNOSIS — K766 Portal hypertension: Secondary | ICD-10-CM

## 2019-07-03 DIAGNOSIS — R339 Retention of urine, unspecified: Secondary | ICD-10-CM | POA: Diagnosis not present

## 2019-07-03 DIAGNOSIS — J90 Pleural effusion, not elsewhere classified: Secondary | ICD-10-CM | POA: Diagnosis present

## 2019-07-03 DIAGNOSIS — Y838 Other surgical procedures as the cause of abnormal reaction of the patient, or of later complication, without mention of misadventure at the time of the procedure: Secondary | ICD-10-CM | POA: Diagnosis not present

## 2019-07-03 DIAGNOSIS — N320 Bladder-neck obstruction: Secondary | ICD-10-CM | POA: Diagnosis not present

## 2019-07-03 DIAGNOSIS — I480 Paroxysmal atrial fibrillation: Secondary | ICD-10-CM | POA: Diagnosis not present

## 2019-07-03 DIAGNOSIS — Z20822 Contact with and (suspected) exposure to covid-19: Secondary | ICD-10-CM | POA: Diagnosis present

## 2019-07-03 DIAGNOSIS — I471 Supraventricular tachycardia: Secondary | ICD-10-CM | POA: Diagnosis not present

## 2019-07-03 DIAGNOSIS — D631 Anemia in chronic kidney disease: Secondary | ICD-10-CM | POA: Diagnosis present

## 2019-07-03 DIAGNOSIS — J189 Pneumonia, unspecified organism: Secondary | ICD-10-CM | POA: Diagnosis present

## 2019-07-03 DIAGNOSIS — R5383 Other fatigue: Secondary | ICD-10-CM

## 2019-07-03 DIAGNOSIS — E873 Alkalosis: Secondary | ICD-10-CM | POA: Diagnosis not present

## 2019-07-03 DIAGNOSIS — J939 Pneumothorax, unspecified: Secondary | ICD-10-CM

## 2019-07-03 DIAGNOSIS — R627 Adult failure to thrive: Secondary | ICD-10-CM | POA: Diagnosis present

## 2019-07-03 DIAGNOSIS — R000 Tachycardia, unspecified: Secondary | ICD-10-CM | POA: Diagnosis not present

## 2019-07-03 DIAGNOSIS — I083 Combined rheumatic disorders of mitral, aortic and tricuspid valves: Secondary | ICD-10-CM | POA: Diagnosis present

## 2019-07-03 DIAGNOSIS — I959 Hypotension, unspecified: Secondary | ICD-10-CM | POA: Diagnosis not present

## 2019-07-03 DIAGNOSIS — M6281 Muscle weakness (generalized): Secondary | ICD-10-CM | POA: Diagnosis present

## 2019-07-03 DIAGNOSIS — M19041 Primary osteoarthritis, right hand: Secondary | ICD-10-CM | POA: Diagnosis present

## 2019-07-03 DIAGNOSIS — E46 Unspecified protein-calorie malnutrition: Secondary | ICD-10-CM

## 2019-07-03 DIAGNOSIS — I272 Pulmonary hypertension, unspecified: Secondary | ICD-10-CM | POA: Diagnosis present

## 2019-07-03 DIAGNOSIS — F410 Panic disorder [episodic paroxysmal anxiety] without agoraphobia: Secondary | ICD-10-CM | POA: Diagnosis not present

## 2019-07-03 DIAGNOSIS — B3781 Candidal esophagitis: Secondary | ICD-10-CM | POA: Diagnosis not present

## 2019-07-03 DIAGNOSIS — J811 Chronic pulmonary edema: Secondary | ICD-10-CM | POA: Diagnosis present

## 2019-07-03 DIAGNOSIS — D638 Anemia in other chronic diseases classified elsewhere: Secondary | ICD-10-CM

## 2019-07-03 DIAGNOSIS — K760 Fatty (change of) liver, not elsewhere classified: Secondary | ICD-10-CM | POA: Diagnosis present

## 2019-07-03 DIAGNOSIS — N186 End stage renal disease: Secondary | ICD-10-CM

## 2019-07-03 DIAGNOSIS — R601 Generalized edema: Secondary | ICD-10-CM | POA: Diagnosis present

## 2019-07-03 DIAGNOSIS — Z09 Encounter for follow-up examination after completed treatment for conditions other than malignant neoplasm: Secondary | ICD-10-CM

## 2019-07-03 DIAGNOSIS — I340 Nonrheumatic mitral (valve) insufficiency: Secondary | ICD-10-CM

## 2019-07-03 DIAGNOSIS — I313 Pericardial effusion (noninflammatory): Principal | ICD-10-CM | POA: Diagnosis present

## 2019-07-03 DIAGNOSIS — Z8249 Family history of ischemic heart disease and other diseases of the circulatory system: Secondary | ICD-10-CM

## 2019-07-03 DIAGNOSIS — K5900 Constipation, unspecified: Secondary | ICD-10-CM | POA: Diagnosis not present

## 2019-07-03 DIAGNOSIS — B370 Candidal stomatitis: Secondary | ICD-10-CM | POA: Diagnosis present

## 2019-07-03 DIAGNOSIS — J9690 Respiratory failure, unspecified, unspecified whether with hypoxia or hypercapnia: Secondary | ICD-10-CM

## 2019-07-03 DIAGNOSIS — Z7189 Other specified counseling: Secondary | ICD-10-CM

## 2019-07-03 DIAGNOSIS — I10 Essential (primary) hypertension: Secondary | ICD-10-CM | POA: Diagnosis present

## 2019-07-03 DIAGNOSIS — F1011 Alcohol abuse, in remission: Secondary | ICD-10-CM | POA: Diagnosis present

## 2019-07-03 LAB — BASIC METABOLIC PANEL
Anion gap: 14 (ref 5–15)
BUN: 33 mg/dL — ABNORMAL HIGH (ref 8–23)
CO2: 25 mmol/L (ref 22–32)
Calcium: 9.2 mg/dL (ref 8.9–10.3)
Chloride: 91 mmol/L — ABNORMAL LOW (ref 98–111)
Creatinine, Ser: 1.43 mg/dL — ABNORMAL HIGH (ref 0.44–1.00)
GFR calc Af Amer: 43 mL/min — ABNORMAL LOW (ref >60)
GFR calc non Af Amer: 37 mL/min — ABNORMAL LOW (ref >60)
Glucose, Bld: 102 mg/dL — ABNORMAL HIGH (ref 70–99)
Potassium: 3.2 mmol/L — ABNORMAL LOW (ref 3.5–5.1)
Sodium: 130 mmol/L — ABNORMAL LOW (ref 135–145)

## 2019-07-03 LAB — HEPATIC FUNCTION PANEL
ALT: 18 U/L (ref 0–44)
AST: 41 U/L (ref 15–41)
Albumin: 3.1 g/dL — ABNORMAL LOW (ref 3.5–5.0)
Alkaline Phosphatase: 51 U/L (ref 38–126)
Bilirubin, Direct: 0.2 mg/dL (ref 0.0–0.2)
Indirect Bilirubin: 0.9 mg/dL (ref 0.3–0.9)
Total Bilirubin: 1.1 mg/dL (ref 0.3–1.2)
Total Protein: 6.4 g/dL — ABNORMAL LOW (ref 6.5–8.1)

## 2019-07-03 LAB — POCT I-STAT 7, (LYTES, BLD GAS, ICA,H+H)
Bicarbonate: 23.1 mmol/L (ref 20.0–28.0)
Calcium, Ion: 1.2 mmol/L (ref 1.15–1.40)
HCT: 26 % — ABNORMAL LOW (ref 36.0–46.0)
Hemoglobin: 8.8 g/dL — ABNORMAL LOW (ref 12.0–15.0)
O2 Saturation: 97 %
Potassium: 3.2 mmol/L — ABNORMAL LOW (ref 3.5–5.1)
Sodium: 130 mmol/L — ABNORMAL LOW (ref 135–145)
TCO2: 24 mmol/L (ref 22–32)
pCO2 arterial: 28.9 mmHg — ABNORMAL LOW (ref 32.0–48.0)
pH, Arterial: 7.51 — ABNORMAL HIGH (ref 7.350–7.450)
pO2, Arterial: 82 mmHg — ABNORMAL LOW (ref 83.0–108.0)

## 2019-07-03 LAB — TROPONIN I (HIGH SENSITIVITY)
Troponin I (High Sensitivity): 149 ng/L (ref <18)
Troponin I (High Sensitivity): 163 ng/L (ref <18)

## 2019-07-03 LAB — CBC
HCT: 28.6 % — ABNORMAL LOW (ref 36.0–46.0)
Hemoglobin: 8.9 g/dL — ABNORMAL LOW (ref 12.0–15.0)
MCH: 27.8 pg (ref 26.0–34.0)
MCHC: 31.1 g/dL (ref 30.0–36.0)
MCV: 89.4 fL (ref 80.0–100.0)
Platelets: 235 10*3/uL (ref 150–400)
RBC: 3.2 MIL/uL — ABNORMAL LOW (ref 3.87–5.11)
RDW: 16 % — ABNORMAL HIGH (ref 11.5–15.5)
WBC: 8.1 10*3/uL (ref 4.0–10.5)
nRBC: 0 % (ref 0.0–0.2)

## 2019-07-03 LAB — D-DIMER, QUANTITATIVE: D-Dimer, Quant: 3.82 ug/mL-FEU — ABNORMAL HIGH (ref 0.00–0.50)

## 2019-07-03 LAB — TRIGLYCERIDES: Triglycerides: 121 mg/dL (ref <150)

## 2019-07-03 LAB — PROTIME-INR
INR: 1.2 (ref 0.8–1.2)
Prothrombin Time: 14.8 seconds (ref 11.4–15.2)

## 2019-07-03 LAB — LACTIC ACID, PLASMA
Lactic Acid, Venous: 1.7 mmol/L (ref 0.5–1.9)
Lactic Acid, Venous: 1.6 mmol/L (ref 0.5–1.9)

## 2019-07-03 LAB — BRAIN NATRIURETIC PEPTIDE: B Natriuretic Peptide: 1124.1 pg/mL — ABNORMAL HIGH (ref 0.0–100.0)

## 2019-07-03 LAB — LACTATE DEHYDROGENASE: LDH: 310 U/L — ABNORMAL HIGH (ref 98–192)

## 2019-07-03 LAB — FIBRINOGEN: Fibrinogen: 485 mg/dL — ABNORMAL HIGH (ref 210–475)

## 2019-07-03 LAB — POC SARS CORONAVIRUS 2 AG -  ED: SARS Coronavirus 2 Ag: NEGATIVE

## 2019-07-03 LAB — PROCALCITONIN: Procalcitonin: 0.17 ng/mL

## 2019-07-03 MED ORDER — SERTRALINE HCL 50 MG PO TABS
25.0000 mg | ORAL_TABLET | Freq: Every day | ORAL | Status: DC
  Administered 2019-07-03: 25 mg via ORAL
  Filled 2019-07-03 (×2): qty 1

## 2019-07-03 MED ORDER — IOHEXOL 350 MG/ML SOLN
55.0000 mL | Freq: Once | INTRAVENOUS | Status: AC | PRN
  Administered 2019-07-03: 55 mL via INTRAVENOUS

## 2019-07-03 MED ORDER — ONDANSETRON HCL 4 MG/2ML IJ SOLN
4.0000 mg | Freq: Once | INTRAMUSCULAR | Status: AC
  Administered 2019-07-03: 4 mg via INTRAVENOUS
  Filled 2019-07-03: qty 2

## 2019-07-03 MED ORDER — GABAPENTIN 300 MG PO CAPS
300.0000 mg | ORAL_CAPSULE | Freq: Once | ORAL | Status: AC
  Administered 2019-07-03: 300 mg via ORAL
  Filled 2019-07-03: qty 1

## 2019-07-03 MED ORDER — SODIUM CHLORIDE 0.9 % IV SOLN
500.0000 mg | INTRAVENOUS | Status: DC
  Administered 2019-07-04 (×2): 500 mg via INTRAVENOUS
  Filled 2019-07-03 (×3): qty 500

## 2019-07-03 MED ORDER — HEPARIN (PORCINE) 25000 UT/250ML-% IV SOLN
650.0000 [IU]/h | INTRAVENOUS | Status: DC
  Administered 2019-07-03: 650 [IU]/h via INTRAVENOUS
  Filled 2019-07-03: qty 250

## 2019-07-03 MED ORDER — SODIUM CHLORIDE 0.9 % IV SOLN
2.0000 g | INTRAVENOUS | Status: DC
  Administered 2019-07-03: 2 g via INTRAVENOUS
  Filled 2019-07-03 (×2): qty 20

## 2019-07-03 MED ORDER — SODIUM CHLORIDE 0.9% FLUSH
3.0000 mL | Freq: Once | INTRAVENOUS | Status: AC
  Administered 2019-07-03: 23:00:00 3 mL via INTRAVENOUS

## 2019-07-03 MED ORDER — HEPARIN BOLUS VIA INFUSION
3000.0000 [IU] | Freq: Once | INTRAVENOUS | Status: AC
  Administered 2019-07-03: 3000 [IU] via INTRAVENOUS
  Filled 2019-07-03: qty 3000

## 2019-07-03 NOTE — Progress Notes (Signed)
  Echocardiogram 2D Echocardiogram has been performed.  Joanna Ali 07/03/2019, 10:52 PM

## 2019-07-03 NOTE — Patient Instructions (Addendum)
Medication Instructions:   *If you need a refill on your cardiac medications before your next appointment, please call your pharmacy*  Lab Work: PATIENT TO [place] ED If you have labs (blood work) drawn today and your tests are completely normal, you will receive your results only by: Marland Kitchen MyChart Message (if you have MyChart) OR . A paper copy in the mail If you have any lab test that is abnormal or we need to change your treatment, we will call you to review the results.  Testing/Procedures: PATIENT TO GO TO ED  Follow-Up: At [facility], you and your health needs are our priority.  As part of our continuing mission to provide you with exceptional heart care, we have created designated Provider Care Teams.  These Care Teams include your primary Cardiologist (physician) and Advanced Practice Providers (APPs -  Physician Assistants and Nurse Practitioners) who all work together to provide you with the care you need, when you need it.  Your next appointment:    PATIENT TO GO TO ED  The format for your next appointment:     Provider:     Other Instructions

## 2019-07-03 NOTE — ED Triage Notes (Signed)
Pt. Stated, Joanna Ali been SOB for 2 days and it seems like its getting worse. Just left the Dr's office and was told to come here.

## 2019-07-03 NOTE — Consult Note (Addendum)
Cardiology Consultation:   Patient ID: Joanna Ali MRN: [id number]; DOB: Apr 09, 1951  Admit date: 07/03/2019 Date of Consult: 07/03/2019  Primary Care Provider: Renaldo Reel, DO Primary Cardiologist: Evalina Field, MD  Primary Electrophysiologist:  None    Patient Profile:   Joanna Ali is a 68 y.o. female with a history of hepatic steatosis, HTN, volume retention, peripheral neuropathy, and known pericardial effusion who presents with one week of worsening dyspnea found to be hypoxic with elevated hsTn.   History of Present Illness:   [name]  Has been followed closely by cardiology since 03/2019 when she was admitted for anasarca and increasing shortness of breath. An echo performed at that time was notable for a preserved LVEF with a moderate to large pericardial effusion (RA free wall collapse, some respiratory variation across the MV, but collapsible RA) though not clinically with features of tamponade. She was diuresed aggressively and work up for anasarca revealed only hypoalbuminemia. She was sent to GI for further work up of possible occult liver disease, and liver biopsy was notable for mild steatosis and features suspicious for steatohepatitis. There was also mild to moderate centrilobular pericellular fibrosis. She has also been referred to rheumatology for further work up but has not yet had an appointment.   She presented today to clinic to see Dr. Audie Box and was found to be visibly tachypneic and in mild respiratory distress. According to the patient and her husband these symptoms, along with progressive tachycardia, have been worsening gradually for the last week. An EKG done in the office was notable for ST in the 130s with low voltage, the latter feature was similar to prior. Given her respiratory distress, Dr. Audie Box sent her to the ER for further evaluation.   Upon arrival to the [facility], patient was hypertensive 136/94 with HR in the 120s. RRs in the mid 33s.  Difficulty in obtaining SpO2 due to ?Raynauds. CT PE was performed and was negative for acute PE. It did demonstrate (again) known moderate pericardial effusion and multifocal areas of interstitial and airspace opacity worrisome for an acute infectious process with some superimposed edema.  Initial labs notable for an elevated pro-BNP (1124 from 77 previously) and an initial hsTn of 149 with delta of 14. Lactate normal at 1.7. Has an AKI with Cr 1.4 from 0.9 prior. ABG performed and normal for respiratory alkalosis with elevated AA gradient (83) given PaO2 of 18mHg on 2L O2.   On review of symptoms, [name] worsening LE edema and does not feel that her dyspnea is worse with recumbency. She denies fevers, chills, cough and has had no known COVID exposure. She states she experienced some fleeting chest pain while in triage in the ER, but denies have similar symptoms In addition to her dyspnea, she complains of worsening LE neuropathic pain and weakness.   With the help of the sonographer, a bedside echo was performed in the ER which was notable for a moderate to large circumferential pericardial effusion with diastolic collapse of the RA wall (seen on prior) and an IVC that was normal in size with > 50% collapse with respiratory variation. There was minimal respiratory flow variation across the mitral and tricuspid valves. Overall echo findings appear similar to prior documented in 03/2019.   Past Medical History:  Diagnosis Date  . Hypertension   . Leg swelling 03/2019  . Short of breath on exertion 03/2019    Past Surgical History:  Procedure Laterality Date  . APPENDECTOMY    .  NECK SURGERY    . TEE WITHOUT CARDIOVERSION N/A 03/28/2019   Procedure: TRANSESOPHAGEAL ECHOCARDIOGRAM (TEE);  Surgeon: Geralynn Rile, MD;  Location: [place];  Service: Cardiology;  Laterality: N/A;     Home Medications:  Prior to Admission medications   Medication Sig Start Date End Date Taking?  Authorizing Provider  gabapentin (NEURONTIN) 300 MG capsule Take by mouth. 05/24/19 07/03/19  [provider]  Multiple Vitamins-Minerals (PRESERVISION AREDS 2) CAPS Take 1 capsule by mouth 2 (two) times daily.     [provider]  senna (SENOKOT) 8.6 MG TABS tablet Take 1 tablet by mouth daily.    [provider]  sertraline (ZOLOFT) 25 MG tablet Take by mouth. 05/17/19 05/16/20  [provider]  spironolactone (ALDACTONE) 100 MG tablet Take by mouth. 04/19/19 05/22/20  [provider]  torsemide (DEMADEX) 20 MG tablet Take by mouth. 04/18/19 05/22/20  [provider]  traMADol (ULTRAM) 50 MG tablet Take 50 mg by mouth every 6 (six) hours as needed.    [provider]  TURMERIC PO Take 1 capsule by mouth 2 (two) times daily.    [provider]    Inpatient Medications: Scheduled Meds: . heparin  3,000 Units Intravenous Once  . sertraline  25 mg Oral Daily  . sodium chloride flush  3 mL Intravenous Once   Continuous Infusions: . azithromycin    . cefTRIAXone (ROCEPHIN)  IV    . heparin      Allergies:   No Known Allergies  Social History:   Social History   Socioeconomic History  . Marital status: Married    Spouse name: Not on file  . Number of children: Not on file  . Years of education: Not on file  . Highest education level: Not on file  Occupational History  . Not on file  Tobacco Use  . Smoking status: Former Smoker    Packs/day: 0.50    Years: 10.00    Pack years: 5.00    Types: Cigarettes    Quit date: 03/20/1979    Years since quitting: 40.3  . Smokeless tobacco: Never Used  . Tobacco comment: pt states she smoked from her early 65s to early 58s.  Substance and Sexual Activity  . Alcohol use: No  . Drug use: No  . Sexual activity: Not on file  Other Topics Concern  . Not on file  Social History Narrative  . Not on file   Social Determinants of Health   Financial Resource Strain:   .  Difficulty of Paying Living Expenses: Not on file  Food Insecurity:   . Worried About Charity fundraiser in the Last Year: Not on file  . Ran Out of Food in the Last Year: Not on file  Transportation Needs:   . Lack of Transportation (Medical): Not on file  . Lack of Transportation (Non-Medical): Not on file  Physical Activity:   . Days of Exercise per Week: Not on file  . Minutes of Exercise per Session: Not on file  Stress:   . Feeling of Stress : Not on file  Social Connections:   . Frequency of Communication with Friends and Family: Not on file  . Frequency of Social Gatherings with Friends and Family: Not on file  . Attends Religious Services: Not on file  . Active Member of Clubs or Organizations: Not on file  . Attends Archivist Meetings: Not on file  . Marital Status: Not on file  Intimate Partner Violence:   . Fear of Current or Ex-Partner: Not on file  . Emotionally Abused: Not on file  . Physically Abused: Not on file  . Sexually Abused: Not on file    Family History:  Non-contributory.   Family History  Problem Relation Age of Onset  . Hypertension Mother      Review of Systems: [y] = yes, [ ]  = no     General: Weight gain [ ] ; Weight loss [ ] ; Anorexia [ ] ; Fatigue Blue.Reese ]; Fever [ ] ; Chills [ ] ; Weakness Blue.Reese ]   Cardiac: Chest pain/pressure [ ] ; Resting SOB Blue.Reese ]; Exertional SOB [ ] ; Orthopnea [ ] ; Pedal Edema [ ] ; Palpitations Blue.Reese ]; Syncope [ ] ; Presyncope [ ] ; Paroxysmal nocturnal dyspnea[ ]    Pulmonary: Cough [ ] ; Wheezing[ ] ; Hemoptysis[ ] ; Sputum [ ] ; Snoring [ ]    GI: Vomiting[ ] ; Dysphagia[ ] ; Melena[ ] ; Hematochezia [ ] ; Heartburn[ ] ; Abdominal pain [ ] ; Constipation [ ] ; Diarrhea [ ] ; BRBPR [ ]    GU: Hematuria[ ] ; Dysuria [ ] ; Nocturia[ ]    Vascular: Pain in legs with walking [ ] ; Pain in feet with lying flat [ ] ; Non-healing sores [ ] ; Stroke [ ] ; TIA [ ] ; Slurred speech [ ] ;   Neuro: Headaches[ ] ; Vertigo[ ] ; Seizures[ ] ; Paresthesias[  ];Blurred vision [ ] ; Diplopia [ ] ; Vision changes [ ]    Ortho/Skin: Arthritis [ ] ; Joint pain [ ] ; Muscle pain [ ] ; Joint swelling [ ] ; Back Pain [ ] ; Rash [ ]    Psych: Depression[ ] ; Anxiety[ ]    Heme: Bleeding problems [ ] ; Clotting disorders [ ] ; Anemia [ ]    Endocrine: Diabetes [ ] ; Thyroid dysfunction[ ]   Physical Exam/Data:   Vitals:   07/03/19 1647 07/03/19 2224  BP: (!) 175/111   Pulse: (!) 127   Resp: (!) 34   Temp: 98 F (36.7 C)   SpO2: 100% 98%   No intake or output data in the 24 hours ending 07/03/19 2257 There were no vitals filed for this visit. There is no height or weight on file to calculate BMI.  General:  Thin caucasian woman. Tachypneic sitting up in bed. Marked improvement in breathing with lying flat.  HEENT: normal Lymph: no adenopathy Neck: no JVD Endocrine:  No thryomegaly Vascular: No carotid bruits; FA pulses 2+ bilaterally without bruits  Cardiac:  Tachycardic. Faint systolic murmur.   Lungs:  Decrease breath sounds at bilateral bases.   Abd: thin.   Ext: bilateral chronic appearing edema 1-2+.  Skin: warm and dry . Neuro:  CNs 2-12 intact, no focal abnormalities noted Psych:  Normal affect   EKG:  The EKG was personally reviewed and demonstrates ST with low voltage. Unchanged from prior.   Relevant CV Studies: TTE 03/2019  1. Left ventricular ejection fraction, by visual estimation, is 60 to 65%. The left ventricle has normal function. Normal left ventricular size. Left ventricular septal wall thickness was normal. Normal left ventricular posterior wall thickness. There  is no left ventricular hypertrophy.  2. Left ventricular diastolic Doppler parameters are consistent with pseudonormalization pattern of LV diastolic filling.  3. Global right ventricle has normal systolic function.The right ventricular size is normal. No increase in right ventricular wall thickness.  4. Left atrial size was mildly dilated.  5. Right atrial size was  normal.  6. Large pericardial effusion.  7. The pericardial effusion is circumferential.  8. There is inversion of the right atrial wall, excessive  respiratory variation in the tricuspid valve spectral Doppler velocities and excessive respiratory variation in the mitral valve spectral Doppler velocities.  9. Pericardial effusion measuring up to 1.36 cm. Findings concerning for early tamponade physiology. 10. The mitral valve is normal in structure. Mild mitral valve regurgitation. No evidence of mitral stenosis. 11. The tricuspid valve is normal in structure. Tricuspid valve regurgitation is mild. 12. The aortic valve The aortic valve is tricuspid Aortic valve regurgitation is moderate by color flow Doppler. Structurally normal aortic valve, with no evidence of sclerosis or stenosis. 13. The pulmonic valve was normal in structure. Pulmonic valve regurgitation is trivial by color flow Doppler. 14. Moderately elevated pulmonary artery systolic pressure. 15. The inferior vena cava is dilated in size with >50% respiratory variability, suggesting right atrial pressure of 8 mmHg. 16. Small patent foramen ovale with predominantly right to left shunting across the atrial septum.  FINDINGS  Left Ventricle: Left ventricular ejection fraction, by visual estimation, is 60 to 65%. The left ventricle has normal function. Left ventricular septal wall thickness was normal. Normal left ventricular posterior wall thickness. There is no left  ventricular hypertrophy. Normal left ventricular size. Spectral Doppler shows Left ventricular diastolic Doppler parameters are consistent with pseudonormalization pattern of LV diastolic filling.  Right Ventricle: The right ventricular size is normal. No increase in right ventricular wall thickness. Global RV systolic function is has normal systolic function. The tricuspid regurgitant velocity is 3.30 m/s, and with an assumed right atrial pressure  of 8 mmHg, the estimated  right ventricular systolic pressure is moderately elevated at 51.6 mmHg.  Left Atrium: Left atrial size was mildly dilated.  Right Atrium: Right atrial size was normal in size  Pericardium: A large pericardial effusion is present. The pericardial effusion is circumferential. The pericardial effusion appears to contain echogenic mass lateral and anterior to the RV which may be fat pad or thrombus. There is inversion of the right  atrial wall, excessive respiratory variation in the tricuspid valve spectral Doppler velocities and excessive respiratory variation in the mitral valve spectral Doppler velocities. Pericardial effusion measuring up to 1.36 cm. Findings concerning for  early tamponade physiology.  Mitral Valve: The mitral valve is normal in structure. No evidence of mitral valve stenosis by observation. MV Area by PHT, 4.60 cm. MV PHT, 47.85 msec. Mild mitral valve regurgitation.  Tricuspid Valve: The tricuspid valve is normal in structure. Tricuspid valve regurgitation is mild by color flow Doppler.  Aortic Valve: The aortic valve The aortic valve is tricuspid. Aortic valve regurgitation is moderate by color flow Doppler. Aortic regurgitation PHT measures 302 msec. The aortic valve is structurally normal, with no evidence of sclerosis or stenosis.  Pulmonic Valve: The pulmonic valve was normal in structure. Pulmonic valve regurgitation is trivial by color flow Doppler.  Aorta: The aortic root, ascending aorta and aortic arch are all structurally normal, with no evidence of dilitation or obstruction.  Venous: The inferior vena cava is dilated in size with greater than 50% respiratory variability, suggesting right atrial pressure of 8 mmHg.  Shunts: A small patent foramen ovale is detected with predominantly right to left shunting across the atrial septum. No ventricular septal defect is seen or detected. There is no evidence of an atrial septal defect. No atrial level shunt  detected by  color flow Doppler.  Laboratory Data:  Chemistry Recent Labs  Lab 07/03/19 1739 07/03/19 2149  NA 130* 130*  K 3.2* 3.2*  CL 91*  --   CO2 25  --  GLUCOSE 102*  --   BUN 33*  --   CREATININE 1.43*  --   CALCIUM 9.2  --   GFRNONAA 37*  --   GFRAA 43*  --   ANIONGAP 14  --     Recent Labs  Lab 07/03/19 1930  PROT 6.4*  ALBUMIN 3.1*  AST 41  ALT 18  ALKPHOS 51  BILITOT 1.1   Hematology Recent Labs  Lab 07/03/19 1739 07/03/19 2149  WBC 8.1  --   RBC 3.20*  --   HGB 8.9* 8.8*  HCT 28.6* 26.0*  MCV 89.4  --   MCH 27.8  --   MCHC 31.1  --   RDW 16.0*  --   PLT 235  --    Cardiac EnzymesNo results for input(s): TROPONINI in the last 168 hours. No results for input(s): TROPIPOC in the last 168 hours.  BNP Recent Labs  Lab 07/03/19 1930  BNP 1,124.1*    DDimer No results for input(s): DDIMER in the last 168 hours.  Radiology/Studies:  DG Chest 2 View  Result Date: 07/03/2019 CLINICAL DATA:  Shortness of breath worsening for 1 week, anasarca, liver disease EXAM: CHEST - 2 VIEW COMPARISON:  04/05/2019 FINDINGS: Enlargement of cardiac silhouette. Mediastinal contours and pulmonary vascularity normal. Peribronchial thickening and mild bibasilar atelectasis. No definite acute infiltrate or pneumothorax. Tiny bibasilar effusions blunt the posterior costophrenic angles. Mild scattered endplate spur formation thoracic spine. IMPRESSION: Bronchitic changes with mild bibasilar atelectasis and tiny pleural effusions. Electronically Signed   By: Lavonia Dana M.D.   On: 07/03/2019 18:06   CT Angio Chest PE W and/or Wo Contrast  Result Date: 07/03/2019 CLINICAL DATA:  PE suspected, high pretest probability. EXAM: CT ANGIOGRAPHY CHEST WITH CONTRAST TECHNIQUE: Multidetector CT imaging of the chest was performed using the standard protocol during bolus administration of intravenous contrast. Multiplanar CT image reconstructions and MIPs were obtained to evaluate the  vascular anatomy. CONTRAST:  60m OMNIPAQUE IOHEXOL 350 MG/ML SOLN COMPARISON:  Chest radiograph 07/03/2019, CT chest 04/05/2019 FINDINGS: Cardiovascular: Satisfactory opacification the pulmonary arteries to the segmental level. No pulmonary artery filling defects are identified. Normal caliber central pulmonary arteries. No elevation of the RV/LV ratio (0.6). Cardiac size at the upper limits of normal. Moderate pericardial effusion. Thoracic aorta is normal caliber. Normal 3 vessel branching of the arch. Minimal atherosclerotic plaque throughout the aorta and branch vessels. Luminal evaluation precluded by suboptimal contrast opacification. Mediastinum/Nodes: Scattered low-attenuation mediastinal and hilar nodes are present. No pathologically enlarged mediastinal, hilar or axillary lymph nodes are seen. Thyroid gland and thoracic inlet are unremarkable. No acute abnormality of the trachea or esophagus. Lungs/Pleura: Multifocal areas of interstitial and airspace opacity with a peripheral predominance seen throughout both lungs. Some interlobular septal thickening is noted in the lung apices. There are small moderate bilateral pleural effusions, right slightly greater than left with extension into the fissures. No pneumothorax. Upper Abdomen: No acute abnormalities present in the visualized portions of the upper abdomen. Musculoskeletal: Multilevel degenerative changes are present in the imaged portions of the spine. Multilevel flowing anterior osteophytosis, compatible with features of diffuse idiopathic skeletal hyperostosis (DISH). Additional mild degenerative changes in the shoulders. Review of the MIP images confirms the above findings. IMPRESSION: 1. No evidence of pulmonary embolus. Multifocal areas of interstitial and airspace opacity with septal thickening bilateral pleural effusions, and pericardial effusion is worrisome for an acute infection process with superimposed edema/heart failure. 2. Scattered  subcentimeter low-attenuation mediastinal and hilar adenopathy, likely reactive. 3.  Aortic Atherosclerosis (ICD10-I70.0). Electronically Signed   By: Lovena Le M.D.   On: 07/03/2019 20:11    Assessment and Plan:   Ms. Eifert is a 68 year old woman who presents with a week of worsening dyspnea and tachycardia. Labs most notable for elevated proBNP at 1124 and hsTn of 149-> 163. CT PE was without embolism, though lung parenchyma with multifocal opacities concerning for atypical infection. ABG with hypoxemic respiratory failure.   On bedside echo tonight, she continues to have a moderate to large pericardial effusion that is mostly unchanged in terms of early tamponade features from prior study. She still has RA free wall collapse and an IVC that is not particularly plethoric with normal respiratory flow variation. I am concerned, however, that her worsening dyspnea may be related to increased shunting across her known, small PFO in the setting of increasing R sided pressures from her effusion. This is further supported by what appears to be (based on her symptoms) platypnea - orthodeoxia [she is acyanotic, not dyspnea when lying down and develops peripheral cyanosis and worsening dyspnea when she sits upright]. If this shunting is related to her worsening effusion vis a vis elevated right sided filling pressures this may be an indication for pericardiocentesis. We should be able to diagnose both via RHC.   With regards to her abnormal hsTn, initally there was concern for possible ACS given report of CP in triage and initial abnormal value. Delta more or less flat and with newly uncovered hypoxia on ABG (which was not apparently on peripheral oximetry) I suspect increased demand most likely to blame and thus heparin has been discontinued.   Her pericardial effusion is likely in some way related to her anasarca and her peripheral neuropathy and additional investigations into a unifying diagnosis are needed.  Will plan to consult rheumatology while inpatient.   At the time of my most recent evaluation she was resting more comfortably with HR 90s and felt that her dyspnea had slightly improved with stable BP in the 595G systolic. This was while recumbent.   I agree with the plan overnight tonight to increase supplemental oxygen and to start antibiotics for presumed CAP while awaiting COVID results. IV antibiotics will also provide small fluid challenge, which will help to uncover low-volume tamponade if this is at play. If she does not improve clinically with these interventions and we don't have an alternative explanation for her worsening dyspnea, we should consider RHC with shunt run to diagnose POS +/- tamponade and quantify her pulmonary pressures. Will keep NPO at midnight tonight and hold additional anticoagulation. Thankfully, she is most comfortable while supine.    #Pericardial Effusion #Platypnea - Orthodeoxia #Hypoxemic Respiratory Failure #Known small PFO -- Keep NPO for possible RHC -- Will receive small amount of fluid resuscitation with IV antibiotics; pressures remain robust.  -- Can consider repeating supine ABG to confirm orthodeoxia.  -- Limited TTE performed overnight; await final read in the AM. Consider additional images to look for position right to left shunting.  -- Continue supplemental oxygen - peripheral oximetry not reliable. Would continue at least 6L. -- Rheumatology consult -- Medications should be filtered due to known PFO and likely right to left shunting.  -- COVID negative. Procalcitonin wnl.   #Anasarca -- Hold torsemide and spironolactone for now.   #Peripheral neuropathy -- Ok to continue gabapentin, tramadol.   Kiernan Atkerson K. Marletta Lor, MD    For questions or updates, please contact [facility] Please consult [email or web address] for contact info  under   Signed, Milus Banister, MD  07/03/2019 10:57 PM

## 2019-07-03 NOTE — Progress Notes (Signed)
[facility] for heparin Indication: chest pain/ACS  Heparin Dosing Weight: 53.1 kg  Labs: Recent Labs    07/03/19 1739 07/03/19 1930  HGB 8.9*  --   HCT 28.6*  --   PLT 235  --   LABPROT  --  14.8  INR  --  1.2  CREATININE 1.43*  --   TROPONINIHS 149* 163*    Estimated Creatinine Clearance: 30.7 mL/min (A) (by C-G formula based on SCr of 1.43 mg/dL (H)).  Assessment: 11 yof presenting with worsening SOB and palpitations. CTA negative for PE but elevated high-sensitivity troponin. Pharmacy consulted to dose heparin for ACS. Patient is not on anticoagulation PTA. Hg 8.9, plt wnl. No active bleed issues documented.  Goal of Therapy:  Heparin level 0.3-0.7 units/ml Monitor platelets by anticoagulation protocol: Yes   Plan:  Heparin 3000 unit bolus Start heparin at 650 units/h 8h heparin level Daily heparin level/CBC Monitor s/sx bleeding   Elicia Lamp, PharmD, BCPS Clinical Pharmacist 06/11/2019 8:49 PM

## 2019-07-03 NOTE — ED Notes (Signed)
Pt placed on 5L per [employer], due to ABG results  Cards and Echo tech at bedside

## 2019-07-03 NOTE — ED Provider Notes (Signed)
[facility] DEPART[facility] Provider Note   CSN: [id number] Arrival date & time: 07/03/19  0277     History Chief Complaint  Patient presents with  . Shortness of Breath  . Palpitations    Joanna Ali is a 68 y.o. 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  She was seen by cardiology earlier today where she has reportedly had a 1 week worsening of shortness of breath and deconditioning.  According to cardiology notes they had a high suspicion for a PE and referred her here for CTA PE study.  She reports that she has been having intermittent feelings of rapid heart rate over the past week.  She denies any known sick contacts.  No nausea or vomiting.  History is limited by patient condition.   HPI     Past Medical History:  Diagnosis Date  . Hypertension   . Leg swelling 03/2019  . Short of breath on exertion 03/2019    Patient Active Problem List   Diagnosis Date Noted  . Pericardial effusion 04/07/2019  . Hypertension 04/07/2019  . Insomnia 04/07/2019  . Monoclonal paraproteinemia   . Anasarca 04/04/2019    Past Surgical History:  Procedure Laterality Date  . APPENDECTOMY    . NECK SURGERY    . TEE WITHOUT CARDIOVERSION N/A 03/28/2019   Procedure: TRANSESOPHAGEAL ECHOCARDIOGRAM (TEE);  Surgeon: Geralynn Rile, MD;  Location: [facility];  Service: Cardiology;  Laterality: N/A;     OB History   No obstetric history on file.     Family History  Problem Relation Age of Onset  . Hypertension Mother     Social History   Tobacco Use  . Smoking status: Former Smoker    Packs/day: 0.50    Years: 10.00    Pack years: 5.00    Types: Cigarettes    Quit date: 03/20/1979    Years since quitting: 40.3  . Smokeless tobacco: Never Used  . Tobacco comment: pt states she smoked from her  early 16s to early 60s.  Substance Use Topics  . Alcohol use: No  . Drug use: No    Home Medications Prior to Admission medications   Medication Sig Start Date End Date Taking? Authorizing Provider  gabapentin (NEURONTIN) 300 MG capsule Take by mouth. 05/24/19 07/03/19  [provider]  Multiple Vitamins-Minerals (PRESERVISION AREDS 2) CAPS Take 1 capsule by mouth 2 (two) times daily.     [provider]  senna (SENOKOT) 8.6 MG TABS tablet Take 1 tablet by mouth daily.    [provider]  sertraline (ZOLOFT) 25 MG tablet Take by mouth. 05/17/19 05/16/20  [provider]  spironolactone (ALDACTONE) 100 MG tablet Take by mouth. 04/19/19 05/22/20  [provider]  torsemide (DEMADEX) 20 MG tablet Take by mouth. 04/18/19 05/22/20  [provider]  traMADol (ULTRAM) 50 MG tablet Take 50 mg by mouth every 6 (six) hours as needed.    [provider]  TURMERIC PO Take 1 capsule by mouth 2 (two) times daily.    [provider]    Allergies    Patient has no known allergies.  Review of Systems   Review of Systems  Constitutional: Positive for fatigue. Negative for chills and fever.  Respiratory: Positive for chest tightness and shortness of breath.   Cardiovascular: Positive for palpitations and leg swelling (Unchanged from normal per  patient). Negative for chest pain.  Gastrointestinal: Negative for abdominal pain.  Neurological: Positive for weakness (Generalized). Negative for headaches.  All other systems reviewed and are negative.   Physical Exam Updated Vital Signs BP (!) 156/108 (BP Location: Left Arm)   Pulse (!) 113   Temp 97.8 F (36.6 C) (Oral)   Resp (!) 28   SpO2 99%   Physical Exam Vitals and nursing note reviewed.  Constitutional:      General: She is in acute distress (Tachypnic, 2-3 word dyspnea. ).     Appearance: She is well-developed.  HENT:     Head: Normocephalic and atraumatic.      Mouth/Throat:     Mouth: Mucous membranes are moist.  Eyes:     General: No scleral icterus.       Right eye: No discharge.        Left eye: No discharge.     Conjunctiva/sclera: Conjunctivae normal.  Cardiovascular:     Rate and Rhythm: Normal rate and regular rhythm.     Pulses:          Radial pulses are 1+ on the right side and 1+ on the left side.       Dorsalis pedis pulses are 1+ on the right side and 1+ on the left side.  Pulmonary:     Effort: Tachypnea present.     Breath sounds: Examination of the left-upper field reveals decreased breath sounds. Examination of the left-middle field reveals decreased breath sounds. Examination of the right-lower field reveals decreased breath sounds. Examination of the left-lower field reveals decreased breath sounds. Decreased breath sounds present.  Chest:     Chest wall: No mass or tenderness.  Abdominal:     General: There is no distension.     Palpations: Abdomen is soft.  Musculoskeletal:        General: No deformity.     Cervical back: Normal range of motion and neck supple.     Comments: BLE with edema.  Right calf is more firm when compared to  Left calf.  TTP in bilateral calfs.   Skin:    General: Skin is warm and dry.     Coloration: Skin is cyanotic (Mild cyanosis of bilateral finger tips, lips, tongue).  Neurological:     General: No focal deficit present.     Mental Status: She is alert.     Motor: No abnormal muscle tone.  Psychiatric:        Mood and Affect: Mood normal.        Behavior: Behavior normal.     ED Results / Procedures / Treatments   Labs (all labs ordered are listed, but only abnormal results are displayed) Labs Reviewed  BASIC METABOLIC PANEL - Abnormal; Notable for the following components:      Result Value   Sodium 130 (*)    Potassium 3.2 (*)    Chloride 91 (*)    Glucose, Bld 102 (*)    BUN 33 (*)    Creatinine, Ser 1.43 (*)    GFR calc non Af Amer 37 (*)    GFR calc Af Amer 43 (*)     All other components within normal limits  CBC - Abnormal; Notable for the following components:   RBC 3.20 (*)    Hemoglobin 8.9 (*)    HCT 28.6 (*)    RDW 16.0 (*)    All other components within normal limits  HEPATIC FUNCTION PANEL - Abnormal; Notable  for the following components:   Total Protein 6.4 (*)    Albumin 3.1 (*)    All other components within normal limits  BRAIN NATRIURETIC PEPTIDE - Abnormal; Notable for the following components:   B Natriuretic Peptide 1,124.1 (*)    All other components within normal limits  D-DIMER, QUANTITATIVE (NOT AT [facility]) - Abnormal; Notable for the following components:   D-Dimer, Quant 3.82 (*)    All other components within normal limits  LACTATE DEHYDROGENASE - Abnormal; Notable for the following components:   LDH 310 (*)    All other components within normal limits  FIBRINOGEN - Abnormal; Notable for the following components:   Fibrinogen 485 (*)    All other components within normal limits  POCT I-STAT 7, (LYTES, BLD GAS, ICA,H+H) - Abnormal; Notable for the following components:   pH, Arterial 7.510 (*)    pCO2 arterial 28.9 (*)    pO2, Arterial 82.0 (*)    Sodium 130 (*)    Potassium 3.2 (*)    HCT 26.0 (*)    Hemoglobin 8.8 (*)    All other components within normal limits  TROPONIN I (HIGH SENSITIVITY) - Abnormal; Notable for the following components:   Troponin I (High Sensitivity) 149 (*)    All other components within normal limits  TROPONIN I (HIGH SENSITIVITY) - Abnormal; Notable for the following components:   Troponin I (High Sensitivity) 163 (*)    All other components within normal limits  RESPIRATORY PANEL BY PCR  CULTURE, BLOOD (ROUTINE X 2)  CULTURE, BLOOD (ROUTINE X 2)  SARS CORONAVIRUS 2 (TAT 6-24 HRS)  PROTIME-INR  LACTIC ACID, PLASMA  LACTIC ACID, PLASMA  PROCALCITONIN  TRIGLYCERIDES  HEPARIN LEVEL (UNFRACTIONATED)  CBC  C-REACTIVE PROTEIN  FERRITIN  POC SARS CORONAVIRUS 2 AG -  ED  I-STAT ARTERIAL  BLOOD GAS, ED    EKG None  Radiology DG Chest 2 View  Result Date: 07/03/2019 CLINICAL DATA:  Shortness of breath worsening for 1 week, anasarca, liver disease EXAM: CHEST - 2 VIEW COMPARISON:  04/05/2019 FINDINGS: Enlargement of cardiac silhouette. Mediastinal contours and pulmonary vascularity normal. Peribronchial thickening and mild bibasilar atelectasis. No definite acute infiltrate or pneumothorax. Tiny bibasilar effusions blunt the posterior costophrenic angles. Mild scattered endplate spur formation thoracic spine. IMPRESSION: Bronchitic changes with mild bibasilar atelectasis and tiny pleural effusions. Electronically Signed   By: Lavonia Dana M.D.   On: 07/03/2019 18:06   CT Angio Chest PE W and/or Wo Contrast  Result Date: 07/03/2019 CLINICAL DATA:  PE suspected, high pretest probability. EXAM: CT ANGIOGRAPHY CHEST WITH CONTRAST TECHNIQUE: Multidetector CT imaging of the chest was performed using the standard protocol during bolus administration of intravenous contrast. Multiplanar CT image reconstructions and MIPs were obtained to evaluate the vascular anatomy. CONTRAST:  40m OMNIPAQUE IOHEXOL 350 MG/ML SOLN COMPARISON:  Chest radiograph 07/03/2019, CT chest 04/05/2019 FINDINGS: Cardiovascular: Satisfactory opacification the pulmonary arteries to the segmental level. No pulmonary artery filling defects are identified. Normal caliber central pulmonary arteries. No elevation of the RV/LV ratio (0.6). Cardiac size at the upper limits of normal. Moderate pericardial effusion. Thoracic aorta is normal caliber. Normal 3 vessel branching of the arch. Minimal atherosclerotic plaque throughout the aorta and branch vessels. Luminal evaluation precluded by suboptimal contrast opacification. Mediastinum/Nodes: Scattered low-attenuation mediastinal and hilar nodes are present. No pathologically enlarged mediastinal, hilar or axillary lymph nodes are seen. Thyroid gland and thoracic inlet are  unremarkable. No acute abnormality of the trachea or esophagus. Lungs/Pleura: Multifocal  areas of interstitial and airspace opacity with a peripheral predominance seen throughout both lungs. Some interlobular septal thickening is noted in the lung apices. There are small moderate bilateral pleural effusions, right slightly greater than left with extension into the fissures. No pneumothorax. Upper Abdomen: No acute abnormalities present in the visualized portions of the upper abdomen. Musculoskeletal: Multilevel degenerative changes are present in the imaged portions of the spine. Multilevel flowing anterior osteophytosis, compatible with features of diffuse idiopathic skeletal hyperostosis (DISH). Additional mild degenerative changes in the shoulders. Review of the MIP images confirms the above findings. IMPRESSION: 1. No evidence of pulmonary embolus. Multifocal areas of interstitial and airspace opacity with septal thickening bilateral pleural effusions, and pericardial effusion is worrisome for an acute infection process with superimposed edema/heart failure. 2. Scattered subcentimeter low-attenuation mediastinal and hilar adenopathy, likely reactive. 3.  Aortic Atherosclerosis (ICD10-I70.0). Electronically Signed   By: Lovena Le M.D.   On: 07/03/2019 20:11    Procedures .Critical Care Performed by: Lorin Glass, PA-C Authorized by: Lorin Glass, PA-C   Critical care provider statement:    Critical care time (minutes):  45   Critical care was necessary to treat or prevent imminent or life-threatening deterioration of the following conditions:  Circulatory failure and respiratory failure   Critical care was time spent personally by me on the following activities:  Discussions with consultants, evaluation of patient's response to treatment, examination of patient, ordering and performing treatments and interventions, ordering and review of laboratory studies, ordering and review of  radiographic studies, pulse oximetry, re-evaluation of patient's condition, obtaining history from patient or surrogate and review of old charts   (including critical care time)  Medications Ordered in ED Medications  sertraline (ZOLOFT) tablet 25 mg (25 mg Oral Given 07/03/19 2245)  cefTRIAXone (ROCEPHIN) 2 g in sodium chloride 0.9 % 100 mL IVPB (0 g Intravenous Stopped 07/03/19 2355)  azithromycin (ZITHROMAX) 500 mg in sodium chloride 0.9 % 250 mL IVPB (500 mg Intravenous New Bag/Given 07/04/19 0005)  sodium chloride flush (NS) 0.9 % injection 3 mL (3 mLs Intravenous Given 07/03/19 2302)  iohexol (OMNIPAQUE) 350 MG/ML injection 55 mL (55 mLs Intravenous Contrast Given 07/03/19 1955)  heparin bolus via infusion 3,000 Units (3,000 Units Intravenous Bolus from Bag 07/03/19 2302)  gabapentin (NEURONTIN) capsule 300 mg (300 mg Oral Given 07/03/19 2244)  ondansetron (ZOFRAN) injection 4 mg (4 mg Intravenous Given 07/03/19 2348)    ED Course  I have reviewed the triage vital signs and the nursing notes.  Pertinent labs & imaging results that were available during my care of the patient were reviewed by me and considered in my medical decision making (see chart for details).  Clinical Course as of Jul 03 17  Mon Jul 03, 2019  1930 Called CT to expedite the CTA PE study.    [EH]  2046 I spoke with on call cardiology, they recommend heparinizing patient.    [EH]  2121 Patient reevaluated.  She is feeling better and like her breathing is easier.  She is currently on 2 L of oxygen however remains tachycardic and tachypneic.     [EH]  2130 I spoke with respiratory about ABG.  Was ordered at 1851 and not resulted.  Linton Rump will follow up and obtain if needed .   [EH]  Tue Jul 04, 2019  0000 Spoke with Dr. Roel Cluck who will admit patient.  Have been in contact with Dr. Marletta Lor of cardiology multiple times.  Will stop heparin.  Given her PFO concern for worsening pericardial effusion/tamponade with  her hypoxia. She states patient does not need a pericardial window tonight or CT surgery consult tonight, that she will arrange for appropriate additional diagnostics.   [EH]    Clinical Course User Index [EH] Ollen Gross   MDM Rules/Calculators/A&P                       Patient presents today from her cardiologist office for evaluation of shortness of breath and tachypnea with concern for a PE.  On initial evaluation she has 2-3 word dyspnea and is requiring oxygen.  In addition she had mild cyanosis of her tongue, lips and fingertips.    Chest x-ray was obtained showing bibasilar effusions with bronchiectatic changes.  Covid antigen test was negative.  CBC shows anemia with a hemoglobin of 8.9.  BMP is significant for evaded creatinine of one-point 0.3, sodium of 130, potassium of 3.2.  Her BNP is elevated at 1124.  PT/INR is normal.  CTA PE study without evidence of PE, does show multifocal areas of interstitial and airspace opacity with septal thickening and bilateral pleural effusions with pericardial effusion.  She does not have a significant leukocytosis.  Her initial troponin was elevated at 149, repeat was elevated at 163.  EKG without evidence of acute ischemia.    Given elevated troponin cardiology was consulted.  I spoke multiple times with Dr. Marletta Lor whose assistance was much appreciated on this complex patient.    Initially given elevated troponin patient was started on heparin.  After imaging ultimately this was discontinued.  Currently leading diagnosis is worsening pericardial effusion starting to cause mild tamponade physiology which for patient, with her PFO, is primarily represented as hypoxia from shunting.  Dr. Marletta Lor states that this does not require emergent intervention tonight, recommended discontinuing heparin in anticipation of possible pericardial centesis in the morning.  ABG was obtained, after patient had been on oxygen for approximately 3 hours, showing  elevated pH at 7.510 with decreased PCO2 at 28.9 and decreased PO2 at 82.  Given this ABG, despite her oxygen saturation reportedly at 100% on monitor, her oxygen was increased to 5 L nasal cannula.    While on oxygen patient reported feeling better and became less tachypneic.    Given CT scan and her constellation of symptoms blood cultures were obtained.  Lactic acid is not elevated.  She was started on antibiotics for possible pneumonia.  This patient was seen as a shared visit with Dr. Roslynn Amble.  Spoke with Dr. Roel Cluck who will see patient for admission.  Final Clinical Impression(s) / ED Diagnoses Final diagnoses:  Hypoxia  Shortness of breath  Pericardial effusion    Rx / DC Orders ED Discharge Orders    None       Lorin Glass, PA-C 07/04/19 0038    Lucrezia Starch, MD 07/05/19 [phone]

## 2019-07-03 NOTE — Telephone Encounter (Signed)
Called Mr. Paszkiewicz back. Mrs. Granderson is more SOB and fatigued. We will get her in for an urgent appointment today.   [place] [address]. Audie Box, [place]  [address] [place], [place] [address] [phone]  12:54 PM

## 2019-07-03 NOTE — Progress Notes (Signed)
Cardiology Office Note:   Date:  07/03/2019  NAME:  Joanna Ali    MRN: [id number] DOB:  [birth year]   PCP:  Renaldo Reel, DO  Cardiologist:  Evalina Field, MD   Referring MD: Octavia Bruckner, MD   Chief Complaint  Patient presents with  . Tachycardia    History of Present Illness:   Joanna Ali is a 68 y.o. female with a hx of hypertension, osteoarthritis, pericardial effusion, anasarca secondary to liver disease, moderate aortic regurgitation who presents for follow-up of worsening shortness of breath.  She presents with her husband.  She reports nearly 1 week of worsening shortness of breath as well as fatigue and weakness.  She reports has been unable to do much in the way of activity.  She requires assistance with all activity.  This is an extreme change for her.  She is noticed no increase lower extremity edema but does have pretty noticeable edema on examination.  Her husband reports that they have been monitoring her heart rate has been rather rapid.  An EKG performed in office today shows sinus tachycardia with PACs with heart rate around 130.  She is noticeably tachypneic on examination and has trouble completing full sentences.  Past Medical History: Past Medical History:  Diagnosis Date  . Hypertension   . Leg swelling 03/2019  . Short of breath on exertion 03/2019    Past Surgical History: Past Surgical History:  Procedure Laterality Date  . APPENDECTOMY    . NECK SURGERY    . TEE WITHOUT CARDIOVERSION N/A 03/28/2019   Procedure: TRANSESOPHAGEAL ECHOCARDIOGRAM (TEE);  Surgeon: Geralynn Rile, MD;  Location: [facility];  Service: Cardiology;  Laterality: N/A;    Current Medications: Current Meds  Medication Sig  . gabapentin (NEURONTIN) 300 MG capsule Take by mouth.  . Multiple Vitamins-Minerals (PRESERVISION AREDS 2) CAPS Take 1 capsule by mouth 2 (two) times daily.   Marland Kitchen senna (SENOKOT) 8.6 MG TABS tablet Take 1 tablet by mouth daily.  . sertraline  (ZOLOFT) 25 MG tablet Take by mouth.  . spironolactone (ALDACTONE) 100 MG tablet Take by mouth.  . torsemide (DEMADEX) 20 MG tablet Take by mouth.  . traMADol (ULTRAM) 50 MG tablet Take 50 mg by mouth every 6 (six) hours as needed.  . TURMERIC PO Take 1 capsule by mouth 2 (two) times daily.     Allergies:    Patient has no known allergies.   Social History: Social History   Socioeconomic History  . Marital status: Married    Spouse name: Not on file  . Number of children: Not on file  . Years of education: Not on file  . Highest education level: Not on file  Occupational History  . Not on file  Tobacco Use  . Smoking status: Former Smoker    Packs/day: 0.50    Years: 10.00    Pack years: 5.00    Types: Cigarettes    Quit date: 03/20/1979    Years since quitting: 40.3  . Smokeless tobacco: Never Used  . Tobacco comment: pt states she smoked from her early 26s to early 6s.  Substance and Sexual Activity  . Alcohol use: No  . Drug use: No  . Sexual activity: Not on file  Other Topics Concern  . Not on file  Social History Narrative  . Not on file   Social Determinants of Health   Financial Resource Strain:   . Difficulty of Paying Living Expenses: Not on file  Food  Insecurity:   . Worried About Charity fundraiser in the Last Year: Not on file  . Ran Out of Food in the Last Year: Not on file  Transportation Needs:   . Lack of Transportation (Medical): Not on file  . Lack of Transportation (Non-Medical): Not on file  Physical Activity:   . Days of Exercise per Week: Not on file  . Minutes of Exercise per Session: Not on file  Stress:   . Feeling of Stress : Not on file  Social Connections:   . Frequency of Communication with Friends and Family: Not on file  . Frequency of Social Gatherings with Friends and Family: Not on file  . Attends Religious Services: Not on file  . Active Member of Clubs or Organizations: Not on file  . Attends Archivist  Meetings: Not on file  . Marital Status: Not on file     Family History: The patient's family history includes Hypertension in her mother.  ROS:   All other ROS reviewed and negative. Pertinent positives noted in the HPI.     EKGs/Labs/Other Studies Reviewed:   The following studies were personally reviewed by me today:  EKG:  EKG is ordered today.  The ekg ordered today demonstrates sinus tachycardia with frequent PACs, heart rate 130, poor R wave progression noted, and was personally reviewed by me.   Recent Labs: 04/04/2019: ALT 18; B Natriuretic Peptide 76.7; Hemoglobin 10.2; Platelets 247 04/07/2019: BUN 21; Creatinine, Ser 0.89; Magnesium 2.0; Potassium 4.0; Sodium 132; TSH 4.565   Recent Lipid Panel    Component Value Date/Time   CHOL 74 04/06/2019 0841   TRIG 72 04/06/2019 0841   HDL 27 (L) 04/06/2019 0841   CHOLHDL 2.7 04/06/2019 0841   VLDL 14 04/06/2019 0841   LDLCALC 33 04/06/2019 0841    Physical Exam:   VS:  BP (!) 136/94 (BP Location: Left Arm, Patient Position: Sitting, Cuff Size: Normal)   Pulse 96   Temp 97.9 F (36.6 C)   Ht 5' 6"  (1.676 m)   Wt 117 lb (53.1 kg)   BMI 18.88 kg/m    Wt Readings from Last 3 Encounters:  07/03/19 117 lb (53.1 kg)  06/06/19 121 lb 12.8 oz (55.2 kg)  04/07/19 136 lb 0.4 oz (61.7 kg)    General: Tachypnea noted on examination, ill-appearing Heart: Atraumatic, normal size  Eyes: PEERLA, EOMI  Neck: Supple, JVD noted around 5 to 7 cm of water Endocrine: No thryomegaly Cardiac: Tachycardia noted Lungs: Diminished breath sounds bilaterally Abd: Soft, nontender, no hepatomegaly  Ext: 2+ pitting edema up to knees Musculoskeletal: No deformities, BUE and BLE strength normal and equal Skin: Venous insufficiency changes noted Neuro: Alert and oriented to person, place, time, and situation, CNII-XII grossly intact, no focal deficits  Psych: Normal mood and affect   ASSESSMENT:   Joanna Ali is a 68 y.o. female who presents  for the following: 1. Sinus tachycardia   2. SOB (shortness of breath)   3. Leg edema   4. Fatigue, unspecified type     PLAN:   1. Sinus tachycardia 2. SOB (shortness of breath) 3. Leg edema 4. Fatigue, unspecified type -She has been treated for diffuse anasarca which is presumably due to liver disease.  This is been rather questionable diagnosis but she was diagnosed at [facility].  She is been evaluated extensively by gastroenterology.  She also has been known to have a moderate pericardial effusion without any evidence of tamponade in the  past.  She does have moderate aortic insufficiency but none of her cardiac work-up explains her symptoms.  She had a rather progressive course of worsening lower extremity edema with the only real etiology of low albumin.  This is all been attributed to possibly liver disease.  She presents with acute worsening of 1 week of shortness of breath, tachycardia and tachypnea.  She is very ill-appearing on my examination.  Her EKG demonstrates sinus tachycardia with frequent PACs.  Overall I am really concerned she possibly had a pulmonary embolism to explain her symptoms.  She also could be having sepsis from some unclear source at this time.  Due to her ill state I have recommended she proceed with emergency room evaluation.  There is no clear cardiac etiology here at this time.  I think we should start in the emergency room with a CT PE study and then further evaluation pending work-up there.  Her husband has agreed to take her.  They are both comfortable with going by personal vehicle. She is too old to go home and her husband agrees.  I do wonder about him taking care for at home as well.  They are both elderly and she is had a rapid decline.  We will plan to see her back in 3 months after this work-up.  Disposition: Return in about 3 months (around 10/01/2019).  Medication Adjustments/Labs and Tests Ordered: Current medicines are reviewed at length with the patient  today.  Concerns regarding medicines are outlined above.  Orders Placed This Encounter  Procedures  . EKG 12-Lead   No orders of the defined types were placed in this encounter.   Patient Instructions  Medication Instructions:   *If you need a refill on your cardiac medications before your next appointment, please call your pharmacy*  Lab Work: PATIENT TO [facility] ED If you have labs (blood work) drawn today and your tests are completely normal, you will receive your results only by: Marland Kitchen MyChart Message (if you have MyChart) OR . A paper copy in the mail If you have any lab test that is abnormal or we need to change your treatment, we will call you to review the results.  Testing/Procedures: PATIENT TO GO TO ED  Follow-Up: At [facility], you and your health needs are our priority.  As part of our continuing mission to provide you with exceptional heart care, we have created designated Provider Care Teams.  These Care Teams include your primary Cardiologist (physician) and Advanced Practice Providers (APPs -  Physician Assistants and Nurse Practitioners) who all work together to provide you with the care you need, when you need it.  Your next appointment:    PATIENT TO GO TO ED  The format for your next appointment:     Provider:     Other Instructions       Signed, Addison Naegeli. Audie Box, [place]  [address], [place],  [address] [phone]  07/03/2019 5:42 PM

## 2019-07-04 ENCOUNTER — Inpatient Hospital Stay (HOSPITAL_COMMUNITY): Payer: Medicare Other

## 2019-07-04 ENCOUNTER — Other Ambulatory Visit: Payer: Self-pay | Admitting: Emergency Medicine

## 2019-07-04 DIAGNOSIS — Q211 Atrial septal defect: Secondary | ICD-10-CM | POA: Diagnosis not present

## 2019-07-04 DIAGNOSIS — J95811 Postprocedural pneumothorax: Secondary | ICD-10-CM | POA: Diagnosis not present

## 2019-07-04 DIAGNOSIS — R9389 Abnormal findings on diagnostic imaging of other specified body structures: Secondary | ICD-10-CM | POA: Diagnosis not present

## 2019-07-04 DIAGNOSIS — Z992 Dependence on renal dialysis: Secondary | ICD-10-CM | POA: Diagnosis not present

## 2019-07-04 DIAGNOSIS — I340 Nonrheumatic mitral (valve) insufficiency: Secondary | ICD-10-CM | POA: Diagnosis not present

## 2019-07-04 DIAGNOSIS — I120 Hypertensive chronic kidney disease with stage 5 chronic kidney disease or end stage renal disease: Secondary | ICD-10-CM | POA: Diagnosis present

## 2019-07-04 DIAGNOSIS — R601 Generalized edema: Secondary | ICD-10-CM

## 2019-07-04 DIAGNOSIS — Z681 Body mass index (BMI) 19 or less, adult: Secondary | ICD-10-CM | POA: Diagnosis not present

## 2019-07-04 DIAGNOSIS — L940 Localized scleroderma [morphea]: Secondary | ICD-10-CM | POA: Diagnosis not present

## 2019-07-04 DIAGNOSIS — R627 Adult failure to thrive: Secondary | ICD-10-CM | POA: Diagnosis not present

## 2019-07-04 DIAGNOSIS — R0902 Hypoxemia: Secondary | ICD-10-CM | POA: Diagnosis present

## 2019-07-04 DIAGNOSIS — L89153 Pressure ulcer of sacral region, stage 3: Secondary | ICD-10-CM | POA: Diagnosis present

## 2019-07-04 DIAGNOSIS — R778 Other specified abnormalities of plasma proteins: Secondary | ICD-10-CM | POA: Diagnosis present

## 2019-07-04 DIAGNOSIS — D72819 Decreased white blood cell count, unspecified: Secondary | ICD-10-CM | POA: Diagnosis present

## 2019-07-04 DIAGNOSIS — Y838 Other surgical procedures as the cause of abnormal reaction of the patient, or of later complication, without mention of misadventure at the time of the procedure: Secondary | ICD-10-CM | POA: Diagnosis not present

## 2019-07-04 DIAGNOSIS — N186 End stage renal disease: Secondary | ICD-10-CM | POA: Diagnosis not present

## 2019-07-04 DIAGNOSIS — R1310 Dysphagia, unspecified: Secondary | ICD-10-CM | POA: Diagnosis not present

## 2019-07-04 DIAGNOSIS — D649 Anemia, unspecified: Secondary | ICD-10-CM | POA: Diagnosis not present

## 2019-07-04 DIAGNOSIS — J90 Pleural effusion, not elsewhere classified: Secondary | ICD-10-CM | POA: Diagnosis present

## 2019-07-04 DIAGNOSIS — D638 Anemia in other chronic diseases classified elsewhere: Secondary | ICD-10-CM | POA: Diagnosis not present

## 2019-07-04 DIAGNOSIS — Z7189 Other specified counseling: Secondary | ICD-10-CM | POA: Diagnosis not present

## 2019-07-04 DIAGNOSIS — D709 Neutropenia, unspecified: Secondary | ICD-10-CM | POA: Diagnosis not present

## 2019-07-04 DIAGNOSIS — Z515 Encounter for palliative care: Secondary | ICD-10-CM | POA: Diagnosis not present

## 2019-07-04 DIAGNOSIS — B373 Candidiasis of vulva and vagina: Secondary | ICD-10-CM | POA: Diagnosis present

## 2019-07-04 DIAGNOSIS — Z87891 Personal history of nicotine dependence: Secondary | ICD-10-CM | POA: Diagnosis not present

## 2019-07-04 DIAGNOSIS — R0602 Shortness of breath: Secondary | ICD-10-CM | POA: Diagnosis present

## 2019-07-04 DIAGNOSIS — E870 Hyperosmolality and hypernatremia: Secondary | ICD-10-CM | POA: Diagnosis not present

## 2019-07-04 DIAGNOSIS — I351 Nonrheumatic aortic (valve) insufficiency: Secondary | ICD-10-CM | POA: Diagnosis present

## 2019-07-04 DIAGNOSIS — I480 Paroxysmal atrial fibrillation: Secondary | ICD-10-CM | POA: Diagnosis present

## 2019-07-04 DIAGNOSIS — K760 Fatty (change of) liver, not elsewhere classified: Secondary | ICD-10-CM | POA: Diagnosis present

## 2019-07-04 DIAGNOSIS — I998 Other disorder of circulatory system: Secondary | ICD-10-CM | POA: Diagnosis not present

## 2019-07-04 DIAGNOSIS — R230 Cyanosis: Secondary | ICD-10-CM | POA: Diagnosis not present

## 2019-07-04 DIAGNOSIS — F410 Panic disorder [episodic paroxysmal anxiety] without agoraphobia: Secondary | ICD-10-CM | POA: Diagnosis present

## 2019-07-04 DIAGNOSIS — M340 Progressive systemic sclerosis: Secondary | ICD-10-CM | POA: Diagnosis not present

## 2019-07-04 DIAGNOSIS — M341 CR(E)ST syndrome: Secondary | ICD-10-CM | POA: Diagnosis present

## 2019-07-04 DIAGNOSIS — I313 Pericardial effusion (noninflammatory): Secondary | ICD-10-CM | POA: Diagnosis not present

## 2019-07-04 DIAGNOSIS — J189 Pneumonia, unspecified organism: Secondary | ICD-10-CM | POA: Diagnosis not present

## 2019-07-04 DIAGNOSIS — I471 Supraventricular tachycardia: Secondary | ICD-10-CM | POA: Diagnosis not present

## 2019-07-04 DIAGNOSIS — I361 Nonrheumatic tricuspid (valve) insufficiency: Secondary | ICD-10-CM | POA: Diagnosis not present

## 2019-07-04 DIAGNOSIS — E874 Mixed disorder of acid-base balance: Secondary | ICD-10-CM | POA: Diagnosis not present

## 2019-07-04 DIAGNOSIS — J811 Chronic pulmonary edema: Secondary | ICD-10-CM | POA: Diagnosis present

## 2019-07-04 DIAGNOSIS — R5381 Other malaise: Secondary | ICD-10-CM | POA: Diagnosis present

## 2019-07-04 DIAGNOSIS — I5021 Acute systolic (congestive) heart failure: Secondary | ICD-10-CM | POA: Diagnosis not present

## 2019-07-04 DIAGNOSIS — E860 Dehydration: Secondary | ICD-10-CM | POA: Diagnosis present

## 2019-07-04 DIAGNOSIS — I429 Cardiomyopathy, unspecified: Secondary | ICD-10-CM | POA: Diagnosis present

## 2019-07-04 DIAGNOSIS — G9341 Metabolic encephalopathy: Secondary | ICD-10-CM | POA: Diagnosis not present

## 2019-07-04 DIAGNOSIS — I4891 Unspecified atrial fibrillation: Secondary | ICD-10-CM | POA: Diagnosis not present

## 2019-07-04 DIAGNOSIS — J9601 Acute respiratory failure with hypoxia: Secondary | ICD-10-CM | POA: Diagnosis not present

## 2019-07-04 DIAGNOSIS — I10 Essential (primary) hypertension: Secondary | ICD-10-CM | POA: Diagnosis not present

## 2019-07-04 DIAGNOSIS — R0989 Other specified symptoms and signs involving the circulatory and respiratory systems: Secondary | ICD-10-CM | POA: Diagnosis not present

## 2019-07-04 DIAGNOSIS — N179 Acute kidney failure, unspecified: Secondary | ICD-10-CM | POA: Diagnosis not present

## 2019-07-04 DIAGNOSIS — D696 Thrombocytopenia, unspecified: Secondary | ICD-10-CM | POA: Diagnosis present

## 2019-07-04 DIAGNOSIS — E873 Alkalosis: Secondary | ICD-10-CM | POA: Diagnosis not present

## 2019-07-04 DIAGNOSIS — E871 Hypo-osmolality and hyponatremia: Secondary | ICD-10-CM | POA: Diagnosis present

## 2019-07-04 DIAGNOSIS — B3781 Candidal esophagitis: Secondary | ICD-10-CM | POA: Diagnosis not present

## 2019-07-04 DIAGNOSIS — Z20822 Contact with and (suspected) exposure to covid-19: Secondary | ICD-10-CM | POA: Diagnosis present

## 2019-07-04 DIAGNOSIS — Z931 Gastrostomy status: Secondary | ICD-10-CM | POA: Diagnosis not present

## 2019-07-04 DIAGNOSIS — J810 Acute pulmonary edema: Secondary | ICD-10-CM | POA: Diagnosis not present

## 2019-07-04 DIAGNOSIS — R64 Cachexia: Secondary | ICD-10-CM | POA: Diagnosis not present

## 2019-07-04 DIAGNOSIS — Z9889 Other specified postprocedural states: Secondary | ICD-10-CM | POA: Diagnosis not present

## 2019-07-04 DIAGNOSIS — D72829 Elevated white blood cell count, unspecified: Secondary | ICD-10-CM | POA: Diagnosis not present

## 2019-07-04 DIAGNOSIS — E43 Unspecified severe protein-calorie malnutrition: Secondary | ICD-10-CM | POA: Diagnosis not present

## 2019-07-04 DIAGNOSIS — D631 Anemia in chronic kidney disease: Secondary | ICD-10-CM | POA: Diagnosis present

## 2019-07-04 LAB — RESPIRATORY PANEL BY PCR

## 2019-07-04 LAB — COMPREHENSIVE METABOLIC PANEL
ALT: 18 U/L (ref 0–44)
AST: 40 U/L (ref 15–41)
Albumin: 2.9 g/dL — ABNORMAL LOW (ref 3.5–5.0)
Alkaline Phosphatase: 46 U/L (ref 38–126)
Anion gap: 28 — ABNORMAL HIGH (ref 5–15)
BUN: 36 mg/dL — ABNORMAL HIGH (ref 8–23)
CO2: 10 mmol/L — ABNORMAL LOW (ref 22–32)
Calcium: 9 mg/dL (ref 8.9–10.3)
Chloride: 95 mmol/L — ABNORMAL LOW (ref 98–111)
Creatinine, Ser: 1.5 mg/dL — ABNORMAL HIGH (ref 0.44–1.00)
GFR calc Af Amer: 40 mL/min — ABNORMAL LOW (ref >60)
GFR calc non Af Amer: 35 mL/min — ABNORMAL LOW (ref >60)
Glucose, Bld: 98 mg/dL (ref 70–99)
Potassium: 3.7 mmol/L (ref 3.5–5.1)
Sodium: 133 mmol/L — ABNORMAL LOW (ref 135–145)
Total Bilirubin: 0.7 mg/dL (ref 0.3–1.2)
Total Protein: 5.7 g/dL — ABNORMAL LOW (ref 6.5–8.1)

## 2019-07-04 LAB — SARS CORONAVIRUS 2 (TAT 6-24 HRS): SARS Coronavirus 2: NEGATIVE

## 2019-07-04 LAB — C-REACTIVE PROTEIN: CRP: 5.7 mg/dL — ABNORMAL HIGH (ref <1.0)

## 2019-07-04 LAB — URINALYSIS, ROUTINE W REFLEX MICROSCOPIC
Bilirubin Urine: NEGATIVE
Glucose, UA: NEGATIVE mg/dL
Hgb urine dipstick: NEGATIVE
Ketones, ur: NEGATIVE mg/dL
Leukocytes,Ua: NEGATIVE
Nitrite: NEGATIVE
Protein, ur: NEGATIVE mg/dL
Specific Gravity, Urine: 1.013 (ref 1.005–1.030)
pH: 6 (ref 5.0–8.0)

## 2019-07-04 LAB — LACTIC ACID, PLASMA: Lactic Acid, Venous: 2 mmol/L (ref 0.5–1.9)

## 2019-07-04 LAB — RESPIRATORY PANEL BY RT PCR (FLU A&B, COVID)
Influenza A by PCR: NEGATIVE
Influenza B by PCR: NEGATIVE
SARS Coronavirus 2 by RT PCR: NEGATIVE

## 2019-07-04 LAB — FERRITIN
Ferritin: 315 ng/mL — ABNORMAL HIGH (ref 11–307)
Ferritin: 329 ng/mL — ABNORMAL HIGH (ref 11–307)

## 2019-07-04 LAB — LACTATE DEHYDROGENASE, PLEURAL OR PERITONEAL FLUID
LD, Fluid: 82 U/L — ABNORMAL HIGH (ref 3–23)
LD, Fluid: 87 U/L — ABNORMAL HIGH (ref 3–23)

## 2019-07-04 LAB — BODY FLUID CELL COUNT WITH DIFFERENTIAL
Eos, Fluid: 0 %
Eos, Fluid: 2 %
Lymphs, Fluid: 18 %
Lymphs, Fluid: 20 %
Monocyte-Macrophage-Serous Fluid: 65 % (ref 50–90)
Monocyte-Macrophage-Serous Fluid: 69 % (ref 50–90)
Neutrophil Count, Fluid: 13 % (ref 0–25)
Neutrophil Count, Fluid: 13 % (ref 0–25)
Total Nucleated Cell Count, Fluid: 400 cu mm (ref 0–1000)
Total Nucleated Cell Count, Fluid: 415 cu mm (ref 0–1000)

## 2019-07-04 LAB — PROTEIN, PLEURAL OR PERITONEAL FLUID
Total protein, fluid: <3 g/dL
Total protein, fluid: <3 g/dL

## 2019-07-04 LAB — CBC
HCT: 25.9 % — ABNORMAL LOW (ref 36.0–46.0)
Hemoglobin: 8.1 g/dL — ABNORMAL LOW (ref 12.0–15.0)
MCH: 27.8 pg (ref 26.0–34.0)
MCHC: 31.3 g/dL (ref 30.0–36.0)
MCV: 89 fL (ref 80.0–100.0)
Platelets: 204 10*3/uL (ref 150–400)
RBC: 2.91 MIL/uL — ABNORMAL LOW (ref 3.87–5.11)
RDW: 16.1 % — ABNORMAL HIGH (ref 11.5–15.5)
WBC: 7.8 10*3/uL (ref 4.0–10.5)
nRBC: 0 % (ref 0.0–0.2)

## 2019-07-04 LAB — POCT I-STAT 7, (LYTES, BLD GAS, ICA,H+H)
Acid-Base Excess: 1 mmol/L (ref 0.0–2.0)
Bicarbonate: 25.7 mmol/L (ref 20.0–28.0)
Calcium, Ion: 1.21 mmol/L (ref 1.15–1.40)
HCT: 26 % — ABNORMAL LOW (ref 36.0–46.0)
Hemoglobin: 8.8 g/dL — ABNORMAL LOW (ref 12.0–15.0)
O2 Saturation: 97 %
Patient temperature: 97.8
Potassium: 3.1 mmol/L — ABNORMAL LOW (ref 3.5–5.1)
Sodium: 131 mmol/L — ABNORMAL LOW (ref 135–145)
TCO2: 27 mmol/L (ref 22–32)
pCO2 arterial: 39.7 mmHg (ref 32.0–48.0)
pH, Arterial: 7.418 (ref 7.350–7.450)
pO2, Arterial: 86 mmHg (ref 83.0–108.0)

## 2019-07-04 LAB — ECHOCARDIOGRAM LIMITED
Height: 66 in
Weight: 1872 oz

## 2019-07-04 LAB — RENAL FUNCTION PANEL
Albumin: 2.9 g/dL — ABNORMAL LOW (ref 3.5–5.0)
Anion gap: 15 (ref 5–15)
BUN: 36 mg/dL — ABNORMAL HIGH (ref 8–23)
CO2: 25 mmol/L (ref 22–32)
Calcium: 9.1 mg/dL (ref 8.9–10.3)
Chloride: 90 mmol/L — ABNORMAL LOW (ref 98–111)
Creatinine, Ser: 1.5 mg/dL — ABNORMAL HIGH (ref 0.44–1.00)
GFR calc Af Amer: 40 mL/min — ABNORMAL LOW (ref >60)
GFR calc non Af Amer: 35 mL/min — ABNORMAL LOW (ref >60)
Glucose, Bld: 147 mg/dL — ABNORMAL HIGH (ref 70–99)
Phosphorus: 5.8 mg/dL — ABNORMAL HIGH (ref 2.5–4.6)
Potassium: 3.3 mmol/L — ABNORMAL LOW (ref 3.5–5.1)
Sodium: 130 mmol/L — ABNORMAL LOW (ref 135–145)

## 2019-07-04 LAB — MAGNESIUM: Magnesium: 1.6 mg/dL — ABNORMAL LOW (ref 1.7–2.4)

## 2019-07-04 LAB — IRON AND TIBC
Iron: 32 ug/dL (ref 28–170)
Saturation Ratios: 11 % (ref 10.4–31.8)
TIBC: 287 ug/dL (ref 250–450)
UIBC: 255 ug/dL

## 2019-07-04 LAB — FOLATE: Folate: 17.4 ng/mL (ref >5.9)

## 2019-07-04 LAB — RETICULOCYTES
Immature Retic Fract: 21.2 % — ABNORMAL HIGH (ref 2.3–15.9)
RBC.: 2.91 MIL/uL — ABNORMAL LOW (ref 3.87–5.11)
Retic Count, Absolute: 119 10*3/uL (ref 19.0–186.0)
Retic Ct Pct: 4.1 % — ABNORMAL HIGH (ref 0.4–3.1)

## 2019-07-04 LAB — VITAMIN B12: Vitamin B-12: 466 pg/mL (ref 180–914)

## 2019-07-04 LAB — PREALBUMIN: Prealbumin: 12.7 mg/dL — ABNORMAL LOW (ref 18–38)

## 2019-07-04 LAB — SODIUM, URINE, RANDOM: Sodium, Ur: 70 mmol/L

## 2019-07-04 LAB — OSMOLALITY, URINE: Osmolality, Ur: 326 mOsm/kg (ref 300–900)

## 2019-07-04 LAB — MRSA PCR SCREENING: MRSA by PCR: NEGATIVE

## 2019-07-04 LAB — PROTEIN, TOTAL: Total Protein: 6.2 g/dL — ABNORMAL LOW (ref 6.5–8.1)

## 2019-07-04 LAB — LACTATE DEHYDROGENASE: LDH: 289 U/L — ABNORMAL HIGH (ref 98–192)

## 2019-07-04 LAB — CREATININE, URINE, RANDOM: Creatinine, Urine: 32.83 mg/dL

## 2019-07-04 LAB — CBG MONITORING, ED: Glucose-Capillary: 103 mg/dL — ABNORMAL HIGH (ref 70–99)

## 2019-07-04 MED ORDER — SODIUM CHLORIDE 0.9% FLUSH
3.0000 mL | Freq: Two times a day (BID) | INTRAVENOUS | Status: DC
  Administered 2019-07-04 – 2019-07-29 (×35): 3 mL via INTRAVENOUS
  Administered 2019-07-30: 10:00:00 10 mL via INTRAVENOUS
  Administered 2019-07-31 – 2019-08-03 (×6): 3 mL via INTRAVENOUS

## 2019-07-04 MED ORDER — GABAPENTIN 300 MG PO CAPS
300.0000 mg | ORAL_CAPSULE | Freq: Three times a day (TID) | ORAL | Status: DC
  Administered 2019-07-04 – 2019-07-12 (×24): 300 mg via ORAL
  Filled 2019-07-04 (×26): qty 1

## 2019-07-04 MED ORDER — SPIRONOLACTONE 25 MG PO TABS
100.0000 mg | ORAL_TABLET | Freq: Every day | ORAL | Status: DC
  Administered 2019-07-04 – 2019-07-05 (×2): 100 mg via ORAL
  Filled 2019-07-04 (×2): qty 4

## 2019-07-04 MED ORDER — POTASSIUM CHLORIDE CRYS ER 20 MEQ PO TBCR
40.0000 meq | EXTENDED_RELEASE_TABLET | Freq: Three times a day (TID) | ORAL | Status: AC
  Administered 2019-07-04 (×2): 40 meq via ORAL
  Filled 2019-07-04 (×2): qty 2

## 2019-07-04 MED ORDER — FUROSEMIDE 10 MG/ML IJ SOLN
80.0000 mg | Freq: Two times a day (BID) | INTRAMUSCULAR | Status: DC
  Administered 2019-07-04 – 2019-07-05 (×3): 80 mg via INTRAVENOUS
  Filled 2019-07-04 (×3): qty 8

## 2019-07-04 MED ORDER — ORAL CARE MOUTH RINSE
15.0000 mL | Freq: Two times a day (BID) | OROMUCOSAL | Status: DC
  Administered 2019-07-04 – 2019-08-22 (×58): 15 mL via OROMUCOSAL

## 2019-07-04 MED ORDER — FENTANYL CITRATE (PF) 100 MCG/2ML IJ SOLN: 25.0000 ug | Freq: Once | INTRAMUSCULAR | Status: AC

## 2019-07-04 MED ORDER — BOOST / RESOURCE BREEZE PO LIQD CUSTOM
1.0000 | Freq: Three times a day (TID) | ORAL | Status: DC
  Administered 2019-07-04: 1 via ORAL

## 2019-07-04 MED ORDER — CHLORHEXIDINE GLUCONATE CLOTH 2 % EX PADS
6.0000 | MEDICATED_PAD | Freq: Every day | CUTANEOUS | Status: DC
  Administered 2019-07-04 – 2019-07-08 (×4): 6 via TOPICAL

## 2019-07-04 MED ORDER — SODIUM BICARBONATE 8.4 % IV SOLN
INTRAVENOUS | Status: DC
  Filled 2019-07-04: qty 150

## 2019-07-04 MED ORDER — SERTRALINE HCL 50 MG PO TABS
25.0000 mg | ORAL_TABLET | Freq: Two times a day (BID) | ORAL | Status: DC
  Administered 2019-07-04 – 2019-07-10 (×13): 25 mg via ORAL
  Filled 2019-07-04 (×14): qty 1

## 2019-07-04 MED ORDER — CAMPHOR-MENTHOL 0.5-0.5 % EX LOTN
TOPICAL_LOTION | CUTANEOUS | Status: DC | PRN
  Administered 2019-07-04 (×2): 1 via TOPICAL
  Filled 2019-07-04 (×2): qty 222

## 2019-07-04 MED ORDER — TRAMADOL HCL 50 MG PO TABS
50.0000 mg | ORAL_TABLET | Freq: Four times a day (QID) | ORAL | Status: DC | PRN
  Administered 2019-07-04 – 2019-07-17 (×14): 50 mg via ORAL
  Filled 2019-07-04 (×16): qty 1

## 2019-07-04 MED ORDER — SODIUM CHLORIDE 0.9% FLUSH: 3.0000 mL | INTRAVENOUS | Status: DC | PRN

## 2019-07-04 MED ORDER — ACETAMINOPHEN 325 MG PO TABS
650.0000 mg | ORAL_TABLET | ORAL | Status: DC | PRN
  Administered 2019-07-19: 650 mg via ORAL
  Filled 2019-07-04 (×2): qty 2

## 2019-07-04 MED ORDER — SODIUM BICARBONATE 8.4 % IV SOLN
100.0000 meq | Freq: Once | INTRAVENOUS | Status: AC
  Administered 2019-07-04: 50 meq via INTRAVENOUS
  Filled 2019-07-04: qty 50

## 2019-07-04 MED ORDER — FENTANYL CITRATE (PF) 100 MCG/2ML IJ SOLN
INTRAMUSCULAR | Status: AC
  Administered 2019-07-04: 13:00:00 25 ug via INTRAVENOUS
  Filled 2019-07-04: qty 2

## 2019-07-04 MED ORDER — SODIUM CHLORIDE 0.9 % IV SOLN
250.0000 mL | INTRAVENOUS | Status: DC | PRN
  Administered 2019-07-06: 500 mL via INTRAVENOUS

## 2019-07-04 NOTE — ED Notes (Signed)
Pt c/o itching and requesting pain medication

## 2019-07-04 NOTE — Progress Notes (Signed)
CXR noted ? incomplete expansion of L lung, will repeat CXR in 2 hours.  Patient states breathing improved.  Erskine Emery MD

## 2019-07-04 NOTE — Consult Note (Addendum)
NAME:  Joanna Ali, MRN:  [id number], DOB:  1951/02/20, LOS: 0 ADMISSION DATE:  07/03/2019, CONSULTATION DATE:  07/04/2019 REFERRING MD:  Dr. Meda Coffee, CHIEF COMPLAINT:  Tachycardia, hypertension, tachypnea  Brief History   68 year old female with several month history of anasarca, known pericardial effusion, pleural effusions, and intermittent dyspnea presenting with worsening dyspnea and fatigue.  Admitted by cardiology, found to be hypervolemic with new AKI but with ongoing tachycardia, tachypnea, respiratory distress/ platypnea- orthodeoxia with essentially unchanged pericardial effusion, PCCM called for evaluation.   History of present illness    68 year old female with history of chronic pericardial effusion, moderate AVR, HTN, former smoker (quit 1980), hepatic steatosis, peripheral neuropathy presenting with one week history of progressive dyspnea and fatigue.    On chart review, patient being thoroughly worked up for progressive dyspnea, bluish discoloration of fingers and toes, increasing LE swelling progressing to diffuse anasarca with multiple admissions for evaluation and diuresis since September.  Ruled out for IE. Was seen by outside rheumatology back in September 2020 with reported negative CRP, ESR RF, anti-Jo/Ro, ANCA, ANA, and SPEP; did have reported M spike.  Thus far, her anasarca, bilateral pleural effusions, and pericardial effusion has been attributed to her hypoalbuminemia.   She was at cardiology appointment where she was found to be again tachycardic, tachypneic with respiratory distress and EKG showing low voltage with rates in 130's.  Saturations have been difficult given her questionable raynaud's.  Sent to ER for further evaluation.  CXR showing bronchitic changes with mild bibasilar atelectatics and tiny pleural effusions.  CTA PE negative for acute PE, showing moderate pericardial effusion and multifocal areas of interstitial and airspace opacity.  Labs noted for pBNP  1124, hs trop 149- 169, hemoglobin 8.9 (previously 10.2), Na 130, lactate 1.7, new AKI with sCr 1.4 and ABG showing respiratory alkalosis.  No WBC or fever or reports of infectious etiology. She was admitted to cardiology.  Bedside echo performed by cardiology showed a moderate to large circumferential pericardial effusion with diastolic collapse of the RA wall (seen on prior TTE) and normal IVC with > 50% collapse with respiratory variation, overall similar to TTE documented 03/2019.  Noted to be more comfortable and less respiratory distress laying flat.  Admitted by cardiology for aggressive diuresis, currently being monitored in ICU.  PCCM consulted for ongoing respiratory distress, tachycardia, and hypertension.   Past Medical History  Chronic pericardial effusion, moderate AVR, HTN, osteoarthritis, macular degeneration, anasarca, hepatic steatosis, peripheral neuropathy  Significant Hospital Events   12/14 Admitted  Consults:  Cards PCCM  Procedures:  12/15 Foley   Significant Diagnostic Tests:  12/14 CTA PE >> 1. No evidence of pulmonary embolus. Multifocal areas of interstitial and airspace opacity with septal thickening bilateral pleural effusions, and pericardial effusion is worrisome for an acute infection process with superimposed edema/heart failure. 2. Scattered subcentimeter low-attenuation mediastinal and hilar adenopathy, likely reactive. 3.  Aortic Atherosclerosis   12/14 TTE  >> 1. Mild global left ventricular hypokinesis with LV EF approximately 50%.  2. The left ventricle has no regional wall motion abnormalities.  3. Left ventricular diastolic parameters are consistent with Grade II diastolic dysfunction (pseudonormalization).  4. Elevated left atrial pressure.  5. Moderate circumferential pericardial effusion.  6. There is inversion of the right atrial wall. No other signs of tamponade are identified.  7. The tricuspid valve is grossly normal. Tricuspid valve  regurgitation is moderate.  8. The pulmonic valve was not well visualized. Pulmonic valve regurgitation  is not visualized.  9. Severely elevated pulmonary artery systolic pressure. 10. The tricuspid regurgitant velocity is 3.85 m/s, and with an assumed right atrial pressure of 8 mmHg, the estimated right ventricular systolic pressure is severely elevated at 67.3 mmHg. 11. The inferior vena cava is normal in size with <50% respiratory variability, suggesting right atrial pressure of 8 mmHg. 12. Aortic valve regurgitation not evaluated. 13. The aortic valve is grossly normal. Aortic valve regurgitation not evaluated. 14. Left ventricular ejection fraction, by visual estimation, is 50 to 55%. The left ventricle has low normal function. There is no left ventricular hypertrophy. 15. The mitral valve is grossly normal. Moderate mitral valve regurgitation. 16. Global right ventricle has normal systolic function.The right ventricular size is normal. No increase in right ventricular wall thickness. 93. Compared to 04/05/2019, the left ventricular systolic function has diminished. The pericardial effusion is unchanged in size and there are still only incomplete findings to support tamponade. Mitral and tricuspid insufficiency are worse. The degree  of pulmonary hypertension appears similar. 18. The diagnosis of pericardial tamponade has reduced sensitivity in the setting of severe pulmonary hypertension. Invasive hemodynamic assessment may be appropriate.   Micro Data:  12/14 BC x 2 >> 12/14 RVP >> neg 12/14 SARS2 >> neg 12/15 SARS2 >> neg 12/15 MRSA PCR >> neg  Antimicrobials:  n/a  Interim history/subjective:  Coughing up some blood tinged secretions Foley placed for urinary retention   Objective   Blood pressure (!) 163/121, pulse (!) 153, temperature 97.7 F (36.5 C), temperature source Oral, resp. rate (!) 31, height _0  (1.676 m), weight 51.9 kg, SpO2 (!) 88 %.        Intake/Output  Summary (Last 24 hours) at 07/04/2019 1122 Last data filed at 07/04/2019 0800 Gross per 24 hour  Intake 335.82 ml  Output --  Net 335.82 ml   Filed Weights   07/04/19 0600  Weight: 51.9 kg    Examination: General:  Thin elderly female lying in bed in mild resp distress, husband at bedside HEENT: MM pink/moist Neuro: Awake, oriented, MAE CV: tachycardic, RR PULM:  tachypneic with increased WOB, clear anteriorly  GI: soft, bs active  Extremities: warm/dry, 2+ LE edema, fingers slightly cyanotic  Skin: no rashes  Resolved Hospital Problem list    Assessment & Plan:   Acute hypoxic respiratory distress - no infectious prodrome, PCT 0.17, CTA neg for PE, more likely related to pericardial effusion vs bilateral pleural effusions +/- metabolic acidosis compensation P:  ABG rechecked- reassuring Supplemental O2 prn sats >92% Bedside US evaluation of effusions- are large with plans for bilateral thoracentesis by Dr. Tamala Julian Sending pleural fluid studies and cytology for left and right, serum LDH and protein CXR following procedure - if thoracentesis do not significant improve her respiratory status, she is at high risk for further decompensation and high risk for intubation, she could be preload dependent given her pericardial effusion which makes intubation risky.  Dc abx for now, doubt this is infectious   Pericardial effusion- chronic, moderate to large and circumferential without tamponade physiology on TTE 12/14 PM - etiology unclear, attributed to hypoalbuminemia thus far, mostly negative outside rheumatology workup in September except noted for M spike, elevated kappa free light chain Hx HTN Anasarca - prior GI workup thought to have mild steatosis  - TTE as above, EF 50%, severely elevated PAP - prior TTE in 03/2019 noted for small PFO P:  She continues to remain tachycardic, tachypneic and hypertensive  Close monitoring in  ICU for further decompensation If all else falls  without improvement/ distress, consider diagnostic/therapeutic pericardiocentesis  Cards with plans for RHC at some point NPO for now  Will need further rheumatology workup Cards diuresing  Strict I/O's, weights   AKI  Hyponatremia  Urinary retention Worsening AGMA - AG of 28 with unclear etiology, ? HF/ erroneous  Anasarca  P:  Recheck lactic acid UA and urine lytes pending Foley cath  Repeat renal panel now Strict I/Os, UOP   Anemia P:  Anemia panel wnl except ferritin level 329 Trend CBC   Hypoalbuminemia - unclear etiology  Protein calorie malnutrition  P:  Check prealbumin  maximize nutrition    Best practice:  Diet: NPO Pain/Anxiety/Delirium protocol (if indicated): n/a VAP protocol (if indicated): n/a DVT prophylaxis: SCDs GI prophylaxis: n/a Glucose control: trend on BMP Mobility: BR Code Status: full  Family Communication: patient and husband updated at bedside on plan of care Disposition: ICU   Labs   CBC: Recent Labs  Lab 07/03/19 1739 07/03/19 2149 07/04/19 0409  WBC 8.1  --  7.8  HGB 8.9* 8.8* 8.1*  HCT 28.6* 26.0* 25.9*  MCV 89.4  --  89.0  PLT 235  --  938    Basic Metabolic Panel: Recent Labs  Lab 07/03/19 1739 07/03/19 2149 07/04/19 0822  NA 130* 130* 133*  K 3.2* 3.2* 3.7  CL 91*  --  95*  CO2 25  --  10*  GLUCOSE 102*  --  98  BUN 33*  --  36*  CREATININE 1.43*  --  1.50*  CALCIUM 9.2  --  9.0   GFR: Estimated Creatinine Clearance: 28.6 mL/min (A) (by C-G formula based on SCr of 1.5 mg/dL (H)). Recent Labs  Lab 07/03/19 1739 07/03/19 1930 07/03/19 2052 07/03/19 2056 07/04/19 0409  PROCALCITON  --   --  0.17  --   --   WBC 8.1  --   --   --  7.8  LATICACIDVEN  --  1.7  --  1.6  --     Liver Function Tests: Recent Labs  Lab 07/03/19 1930 07/04/19 0822  AST 41 40  ALT 18 18  ALKPHOS 51 46  BILITOT 1.1 0.7  PROT 6.4* 5.7*  ALBUMIN 3.1* 2.9*   No results for input(s): LIPASE, AMYLASE in the last 168  hours. No results for input(s): AMMONIA in the last 168 hours.  ABG    Component Value Date/Time   PHART 7.510 (H) 07/03/2019 2149   PCO2ART 28.9 (L) 07/03/2019 2149   PO2ART 82.0 (L) 07/03/2019 2149   HCO3 23.1 07/03/2019 2149   TCO2 24 07/03/2019 2149   O2SAT 97.0 07/03/2019 2149     Coagulation Profile: Recent Labs  Lab 07/03/19 1930  INR 1.2    Cardiac Enzymes: No results for input(s): CKTOTAL, CKMB, CKMBINDEX, TROPONINI in the last 168 hours.  HbA1C: No results found for: HGBA1C  CBG: Recent Labs  Lab 07/04/19 0418  GLUCAP 103*    Review of Systems:   As per HPI otherwise negative  Past Medical History  She,  has a past medical history of Hypertension, Leg swelling (03/2019), and Short of breath on exertion (03/2019).   Surgical History    Past Surgical History:  Procedure Laterality Date  . APPENDECTOMY    . NECK SURGERY    . TEE WITHOUT CARDIOVERSION N/A 03/28/2019   Procedure: TRANSESOPHAGEAL ECHOCARDIOGRAM (TEE);  Surgeon: Geralynn Rile, MD;  Location: [facility];  Service: Cardiology;  Laterality: N/A;     Social History   reports that she quit smoking about 40 years ago. Her smoking use included cigarettes. [name]. She has never used smokeless tobacco. [name].   Family History   Her family history includes Hypertension in her mother.   Allergies No Known Allergies   Home Medications  Prior to Admission medications   Medication Sig Start Date End Date Taking? Authorizing Provider  gabapentin (NEURONTIN) 300 MG capsule Take 300 mg by mouth 3 (three) times daily.  05/24/19 07/04/19 Yes [provider]  Multiple Vitamins-Minerals (MULTIVITAMIN WITH MINERALS) tablet Take 1 tablet by mouth daily.   Yes [provider]  Multiple Vitamins-Minerals (PRESERVISION AREDS 2) CAPS Take 1 capsule by mouth 2 (two) times daily.    Yes [provider]  Propylene Glycol (SYSTANE BALANCE) 0.6 % SOLN Place 1 drop into both eyes 2 (two) times daily as needed (dry eyes).   Yes [provider]  senna (SENOKOT) 8.6 MG TABS tablet Take 1-3 tablets by mouth See admin instructions. Taking 1 tablet in the am and 2 tabs at bedtime   Yes [provider]  sertraline (ZOLOFT) 25 MG tablet Take 25 mg by mouth 2 (two) times daily.  05/17/19 05/16/20 Yes [provider]  spironolactone (ALDACTONE) 100 MG tablet Take 100 mg by mouth daily.  04/19/19 05/22/20 Yes [provider]  torsemide (DEMADEX) 20 MG tablet Take 20 mg by mouth daily.  04/18/19 05/22/20 Yes [provider]  traMADol (ULTRAM) 50 MG tablet Take 50 mg by mouth every 6 (six) hours as needed for moderate pain.    Yes [provider]  TURMERIC PO Take 1 capsule by mouth daily.    Yes [provider]     Critical care time: 30 min      Kennieth Rad, MSN, AGACNP-BC [facility] 07/04/2019, 1:39 PM

## 2019-07-04 NOTE — Progress Notes (Signed)
Paged cards PA Furth in regards to pt arrival and BP of 157/100 with no current BP PRNs ordered. Awaiting call back.

## 2019-07-04 NOTE — Progress Notes (Signed)
Progress Note  [name] Date of Encounter: 07/04/2019  Primary Cardiologist: Evalina Field, MD   Subjective   Difficulty completing half of a sentence without taking in a breath.  Feels poor.  Laying flat.  Inpatient Medications    Scheduled Meds: . Chlorhexidine Gluconate Cloth  6 each Topical Daily  . mouth rinse  15 mL Mouth Rinse BID  . sertraline  25 mg Oral Daily  . sodium chloride flush  3 mL Intravenous Q12H   Continuous Infusions: . sodium chloride    . azithromycin Stopped (07/04/19 0105)  . cefTRIAXone (ROCEPHIN)  IV Stopped (07/03/19 2332)   PRN Meds: sodium chloride, acetaminophen, camphor-menthol, sodium chloride flush, traMADol   Vital Signs    Vitals:   07/04/19 0700 07/04/19 0715 07/04/19 0800 07/04/19 0813  BP: (!) 157/100 (!) 154/104 (!) 143/98   Pulse: (!) 106  (!) 110   Resp: (!) 35 (!) 37 (!) 30   Temp:    97.7 F (36.5 C)  TempSrc:    Oral  SpO2: 98% 98% 100%   Weight:      Height:        Intake/Output Summary (Last 24 hours) at 07/04/2019 0851 Last data filed at 07/04/2019 0800 Gross per 24 hour  Intake 335.82 ml  Output --  Net 335.82 ml   Last 3 Weights 07/04/2019 07/03/2019 06/06/2019  Weight (lbs) 114 lb 6.7 oz 117 lb 121 lb 12.8 oz  Weight (kg) 51.9 kg 53.071 kg 55.248 kg      Telemetry    Sinus rhythm/sinus tachycardia- Personally Reviewed  ECG    Sinus tachycardia no ischemic changes- Personally Reviewed  Physical Exam   GEN:  Cachectic, in moderate distress Neck:  Moderate JVD Cardiac: RRR, soft systolic and diastolic murmurs, no rubs, or gallops.  Respiratory: Clear to auscultation bilaterally with decreased breath sounds at bases. GI: Soft, nontender, minimally-distended  MS:  Minimal lower extremity edema; No deformity. Neuro:  Nonfocal  Psych: Normal affect   Labs    High Sensitivity Troponin:   Recent Labs  Lab 07/03/19 1739 07/03/19 1930  TROPONINIHS 149* 163*       Chemistry Recent Labs  Lab 07/03/19 1739 07/03/19 1930 07/03/19 2149  NA 130*  --  130*  K 3.2*  --  3.2*  CL 91*  --   --   CO2 25  --   --   GLUCOSE 102*  --   --   BUN 33*  --   --   CREATININE 1.43*  --   --   CALCIUM 9.2  --   --   PROT  --  6.4*  --   ALBUMIN  --  3.1*  --   AST  --  41  --   ALT  --  18  --   ALKPHOS  --  51  --   BILITOT  --  1.1  --   GFRNONAA 37*  --   --   GFRAA 43*  --   --   ANIONGAP 14  --   --      Hematology Recent Labs  Lab 07/03/19 1739 07/03/19 2149 07/04/19 0409  WBC 8.1  --  7.8  RBC 3.20*  --  2.91*  2.91*  HGB 8.9* 8.8* 8.1*  HCT 28.6* 26.0* 25.9*  MCV 89.4  --  89.0  MCH 27.8  --  27.8  MCHC 31.1  --  31.3  RDW 16.0*  --  16.1*  PLT 235  --  204    BNP Recent Labs  Lab 07/03/19 1930  BNP 1,124.1*     DDimer  Recent Labs  Lab 07/03/19 2052  DDIMER 3.82*     Radiology    DG Chest 2 View  Result Date: 07/03/2019 CLINICAL DATA:  Shortness of breath worsening for 1 week, anasarca, liver disease EXAM: CHEST - 2 VIEW COMPARISON:  04/05/2019 FINDINGS: Enlargement of cardiac silhouette. Mediastinal contours and pulmonary vascularity normal. Peribronchial thickening and mild bibasilar atelectasis. No definite acute infiltrate or pneumothorax. Tiny bibasilar effusions blunt the posterior costophrenic angles. Mild scattered endplate spur formation thoracic spine. IMPRESSION: Bronchitic changes with mild bibasilar atelectasis and tiny pleural effusions. Electronically Signed   By: Lavonia Dana M.D.   On: 07/03/2019 18:06   CT Angio Chest PE W and/or Wo Contrast  Result Date: 07/03/2019 CLINICAL DATA:  PE suspected, high pretest probability. EXAM: CT ANGIOGRAPHY CHEST WITH CONTRAST TECHNIQUE: Multidetector CT imaging of the chest was performed using the standard protocol during bolus administration of intravenous contrast. Multiplanar CT image reconstructions and MIPs were obtained to evaluate the vascular anatomy.  CONTRAST:  64m OMNIPAQUE IOHEXOL 350 MG/ML SOLN COMPARISON:  Chest radiograph 07/03/2019, CT chest 04/05/2019 FINDINGS: Cardiovascular: Satisfactory opacification the pulmonary arteries to the segmental level. No pulmonary artery filling defects are identified. Normal caliber central pulmonary arteries. No elevation of the RV/LV ratio (0.6). Cardiac size at the upper limits of normal. Moderate pericardial effusion. Thoracic aorta is normal caliber. Normal 3 vessel branching of the arch. Minimal atherosclerotic plaque throughout the aorta and branch vessels. Luminal evaluation precluded by suboptimal contrast opacification. Mediastinum/Nodes: Scattered low-attenuation mediastinal and hilar nodes are present. No pathologically enlarged mediastinal, hilar or axillary lymph nodes are seen. Thyroid gland and thoracic inlet are unremarkable. No acute abnormality of the trachea or esophagus. Lungs/Pleura: Multifocal areas of interstitial and airspace opacity with a peripheral predominance seen throughout both lungs. Some interlobular septal thickening is noted in the lung apices. There are small moderate bilateral pleural effusions, right slightly greater than left with extension into the fissures. No pneumothorax. Upper Abdomen: No acute abnormalities present in the visualized portions of the upper abdomen. Musculoskeletal: Multilevel degenerative changes are present in the imaged portions of the spine. Multilevel flowing anterior osteophytosis, compatible with features of diffuse idiopathic skeletal hyperostosis (DISH). Additional mild degenerative changes in the shoulders. Review of the MIP images confirms the above findings. IMPRESSION: 1. No evidence of pulmonary embolus. Multifocal areas of interstitial and airspace opacity with septal thickening bilateral pleural effusions, and pericardial effusion is worrisome for an acute infection process with superimposed edema/heart failure. 2. Scattered subcentimeter  low-attenuation mediastinal and hilar adenopathy, likely reactive. 3.  Aortic Atherosclerosis (ICD10-I70.0). Electronically Signed   By: PLovena LeM.D.   On: 07/03/2019 20:11    Cardiac Studies   Echocardiogram 07/03/2019: Moderate to large pericardial effusion circumferential with periodic diastolic invagination of the right atrial free wall, IVC does collapse, normal EF, elevated pulmonary pressures  Upper and lower extremity ABIs were normal secondary to blue fingers and toes  CT of abdomen pelvis on 04/05/2019 was unremarkable  Patient Profile     68y.o. female with anasarca thought to be liver related with elevated pulmonary pressures normal EF chronic pericardial effusion pleural effusion negative PE increased dyspnea on exertion unremarkable thyroid function negative proteinuria  Assessment & Plan    Anasarca/ dyspnea/pleural effusions bilaterally/moderate to large circumferential pericardial effusion/hypertension/Covid negative -In review of prior notes, presumably  secondary to liver disease diagnosed at [facility] but this has been a rather questionable diagnosis, extensive work-up by gastroenterology as well. -No PE -Pleural effusions present. -Personally reviewed her echocardiogram, I do not see any evidence of RV collapse, IVC does collapse.  Minimal respiratory variation in TV/MV continuous-wave Doppler.  She does have periodic invagination of the right atrial free wall. -Her blood pressures currently are in the 681L to 572I systolic with diastolics as high as 203.  This is not compatible with cardiac tamponade. -Clearly with her volume excess, pleural effusions etc., I think it makes sense for [place] to first trial IV Lasix.  Obviously, if hemodynamic collapse were to occur, right heart cath can always be performed. -Extremely complex prior medical history with multiple consultants. -Thyroid functions were overall normal, no evidence of myxedema. -Inflammatory markers are  elevated -Albumin is mildly reduced 3 -Abdominal ultrasound is unremarkable except for evidence of contrast in her IVC which is secondary to elevated right ventricular/right atrial pressures -I will go ahead and start with Lasix IV 80 mg twice a day.  Resume her spironolactone home dose of 100 mg a day. -She is also requesting her gabapentin for itchy lower extremities.  Continue with sertraline.  Protein calorie malnutrition -BMI is currently 18.  Support protein load.  Anemia of chronic disease -Chronically low hemoglobin.  CRITICAL CARE Performed by: Candee Furbish   Total critical care time: 50 minutes  Critical care time was exclusive of separately billable procedures and treating other patients.  Critical care was necessary to treat or prevent imminent or life-threatening deterioration.  Critical care was time spent personally by me on the following activities: development of treatment plan with patient and/or surrogate as well as nursing, discussions with consultants, evaluation of patient's response to treatment, examination of patient, obtaining history from patient or surrogate, ordering and performing treatments and interventions, ordering and review of laboratory studies, ordering and review of radiographic studies, pulse oximetry and re-evaluation of patient's condition.   For questions or updates, please contact [place] Please consult [email or web address] for contact info under        Signed, Candee Furbish, MD  07/04/2019, 8:51 AM

## 2019-07-04 NOTE — Progress Notes (Signed)
Patient complaining of worsening SOB. RRs 30-40s. HR 140s-150s. O2 sats dropped to the 80s on 6LNC. B/P 163/121 (135). Has not voided since IV Lasix administered. Paged Marlou Porch, MD. Will consult CCM, obtain ABG, bladder scan patient.

## 2019-07-04 NOTE — ED Notes (Signed)
Dr. Roel Cluck at bedside. Pt c/o chest tightness and worsening sob

## 2019-07-04 NOTE — ED Notes (Signed)
Pt became nauseous when sitting on the side of the bed. Small Clear liquid vomited.

## 2019-07-04 NOTE — ED Notes (Signed)
R fingertips noted to be intermittently purple

## 2019-07-04 NOTE — Procedures (Signed)
[place] w/ [place] Note Timeout performed R chest examined with [place] and skin overlying fluid pocket marked Area prepped and anesthesized with 1% lidocaine 700  cc straw  fluid removed Bandaid applied to site CXR pending No immediate complications

## 2019-07-04 NOTE — ED Notes (Signed)
Spoke to Cards concerning respiratory rate, c/o chest tightness, sats 100% on 6L [place]

## 2019-07-04 NOTE — Progress Notes (Signed)
[facility] Progress Note [name] DOB: 11/20/1950 MRN: [id number]   Date of Service  07/04/2019  HPI/Events of Note  Pt is s/p bilateral thoracentesis,  Request to check CXR to r/o pneumothorax.  eICU Interventions  No evidence of pneumothorax.        Kerry Kass [name] 07/04/2019, 10:05 PM

## 2019-07-04 NOTE — Progress Notes (Signed)
CRITICAL VALUE STICKER  CRITICAL VALUE: Lactic 2.0  DATE & TIME NOTIFIED: 07/04/2019 1317  MD NOTIFIED: Tamala Julian, MD  RESPONSE: No new orders

## 2019-07-04 NOTE — ED Notes (Signed)
Pt resting, appears comfortable at present

## 2019-07-04 NOTE — ED Notes (Signed)
Message sent to pharmacy for cerna

## 2019-07-04 NOTE — ED Notes (Signed)
Respiratory rate decreasing; chest pain also has lessened at present

## 2019-07-04 NOTE — Procedures (Signed)
[place] w/ [place] Note Timeout performed L chest examined with [place] and skin overlying fluid pocket marked Area prepped and anesthesized with 1% lidocaine 700  cc straw  fluid removed Bandaid applied to site CXR pending No immediate complications

## 2019-07-05 ENCOUNTER — Inpatient Hospital Stay (HOSPITAL_COMMUNITY): Payer: Medicare Other

## 2019-07-05 DIAGNOSIS — R778 Other specified abnormalities of plasma proteins: Secondary | ICD-10-CM

## 2019-07-05 DIAGNOSIS — J90 Pleural effusion, not elsewhere classified: Secondary | ICD-10-CM

## 2019-07-05 DIAGNOSIS — E43 Unspecified severe protein-calorie malnutrition: Secondary | ICD-10-CM | POA: Diagnosis present

## 2019-07-05 LAB — CBC
HCT: 25.6 % — ABNORMAL LOW (ref 36.0–46.0)
Hemoglobin: 8.1 g/dL — ABNORMAL LOW (ref 12.0–15.0)
MCH: 27.3 pg (ref 26.0–34.0)
MCHC: 31.6 g/dL (ref 30.0–36.0)
MCV: 86.2 fL (ref 80.0–100.0)
Platelets: 191 10*3/uL (ref 150–400)
RBC: 2.97 MIL/uL — ABNORMAL LOW (ref 3.87–5.11)
RDW: 16 % — ABNORMAL HIGH (ref 11.5–15.5)
WBC: 9.1 10*3/uL (ref 4.0–10.5)
nRBC: 0 % (ref 0.0–0.2)

## 2019-07-05 LAB — BASIC METABOLIC PANEL
Anion gap: 15 (ref 5–15)
BUN: 38 mg/dL — ABNORMAL HIGH (ref 8–23)
CO2: 24 mmol/L (ref 22–32)
Calcium: 8.7 mg/dL — ABNORMAL LOW (ref 8.9–10.3)
Chloride: 92 mmol/L — ABNORMAL LOW (ref 98–111)
Creatinine, Ser: 1.72 mg/dL — ABNORMAL HIGH (ref 0.44–1.00)
GFR calc Af Amer: 34 mL/min — ABNORMAL LOW (ref >60)
GFR calc non Af Amer: 30 mL/min — ABNORMAL LOW (ref >60)
Glucose, Bld: 107 mg/dL — ABNORMAL HIGH (ref 70–99)
Potassium: 4.1 mmol/L (ref 3.5–5.1)
Sodium: 131 mmol/L — ABNORMAL LOW (ref 135–145)

## 2019-07-05 LAB — SCLERODERMA DIAGNOSTIC PROFILE
Anti Nuclear Antibody (ANA): NEGATIVE
Scleroderma (Scl-70) (ENA) Antibody, IgG: <0.2 AI (ref 0.0–0.9)

## 2019-07-05 LAB — CYTOLOGY - NON PAP

## 2019-07-05 MED ORDER — ONDANSETRON HCL 4 MG/2ML IJ SOLN
4.0000 mg | Freq: Four times a day (QID) | INTRAMUSCULAR | Status: DC | PRN
  Administered 2019-07-05 – 2019-08-09 (×18): 4 mg via INTRAVENOUS
  Filled 2019-07-05 (×19): qty 2

## 2019-07-05 MED ORDER — TECHNETIUM TC 99M PYROPHOSPHATE
20.7000 | Freq: Once | INTRAVENOUS | Status: AC | PRN
  Administered 2019-07-05: 20.7 via INTRAVENOUS
  Filled 2019-07-05: qty 21

## 2019-07-05 MED ORDER — ENSURE ENLIVE PO LIQD
237.0000 mL | Freq: Three times a day (TID) | ORAL | Status: DC
  Administered 2019-07-05 – 2019-07-20 (×19): 237 mL via ORAL

## 2019-07-05 NOTE — Progress Notes (Signed)
Progress Note  [name] Date of Encounter: 07/05/2019  Primary Cardiologist: Evalina Field, MD   Subjective   Breathing slightly worse today than yesterday after thoracentesis.  No chest pain.  Inpatient Medications    Scheduled Meds: . Chlorhexidine Gluconate Cloth  6 each Topical Daily  . feeding supplement  1 Container Oral TID BM  . gabapentin  300 mg Oral TID  . mouth rinse  15 mL Mouth Rinse BID  . sertraline  25 mg Oral BID  . sodium chloride flush  3 mL Intravenous Q12H   Continuous Infusions: . sodium chloride    . azithromycin Stopped (07/04/19 2231)   PRN Meds: sodium chloride, acetaminophen, camphor-menthol, sodium chloride flush, traMADol   Vital Signs    Vitals:   07/05/19 0700 07/05/19 0751 07/05/19 0800 07/05/19 0900  BP: (!) 141/94  (!) 139/98 125/86  Pulse: (!) 114  (!) 111 (!) 101  Resp: (!) 35  (!) 30 19  Temp:  (!) 96.8 F (36 C)    TempSrc:  Axillary    SpO2: 100%  100% 100%  Weight:      Height:        Intake/Output Summary (Last 24 hours) at 07/05/2019 0937 Last data filed at 07/05/2019 0914 Gross per 24 hour  Intake 978.35 ml  Output 2070 ml  Net -1091.65 ml   Last 3 Weights 07/05/2019 07/04/2019 07/03/2019  Weight (lbs) 111 lb 114 lb 6.7 oz 117 lb  Weight (kg) 50.349 kg 51.9 kg 53.071 kg      Telemetry    Sinus rhythm/sinus tachycardia- Personally Reviewed  ECG    Sinus tachycardia no ischemic changes- Personally Reviewed  Physical Exam   GEN:  Cachectic, breathing has improved but still mildly labored Neck:  No sigJVD Cardiac: RRR, soft systolic and diastolic murmurs, no rubs, or gallops.  2+ right radial Respiratory: Clear to auscultation bilaterally with mild crackles at the bases GI: Soft, nontender, minimally-distended  MS:  Minimal lower extremity edema, dry skin; No deformity. Neuro:  Nonfocal  Psych: Normal affect   Labs    High Sensitivity Troponin:   Recent Labs  Lab  07/03/19 1739 07/03/19 1930  TROPONINIHS 149* 163*      Chemistry Recent Labs  Lab 07/03/19 1930 07/04/19 0822 07/04/19 1229 07/04/19 1424 07/05/19 0245  NA  --  133* 131* 130* 131*  K  --  3.7 3.1* 3.3* 4.1  CL  --  95*  --  90* 92*  CO2  --  10*  --  25 24  GLUCOSE  --  98  --  147* 107*  BUN  --  36*  --  36* 38*  CREATININE  --  1.50*  --  1.50* 1.72*  CALCIUM  --  9.0  --  9.1 8.7*  PROT 6.4* 5.7*  --  6.2*  --   ALBUMIN 3.1* 2.9*  --  2.9*  --   AST 41 40  --   --   --   ALT 18 18  --   --   --   ALKPHOS 51 46  --   --   --   BILITOT 1.1 0.7  --   --   --   GFRNONAA  --  35*  --  35* 30*  GFRAA  --  40*  --  40* 34*  ANIONGAP  --  28*  --  15 15     Hematology Recent Labs  Lab 07/03/19 1739 07/04/19 0409 07/04/19 1229 07/05/19 0245  WBC 8.1 7.8  --  9.1  RBC 3.20* 2.91*  2.91*  --  2.97*  HGB 8.9* 8.1* 8.8* 8.1*  HCT 28.6* 25.9* 26.0* 25.6*  MCV 89.4 89.0  --  86.2  MCH 27.8 27.8  --  27.3  MCHC 31.1 31.3  --  31.6  RDW 16.0* 16.1*  --  16.0*  PLT 235 204  --  191    BNP Recent Labs  Lab 07/03/19 1930  BNP 1,124.1*     DDimer  Recent Labs  Lab 07/03/19 2052  DDIMER 3.82*     Radiology    DG Chest 2 View  Result Date: 07/03/2019 CLINICAL DATA:  Shortness of breath worsening for 1 week, anasarca, liver disease EXAM: CHEST - 2 VIEW COMPARISON:  04/05/2019 FINDINGS: Enlargement of cardiac silhouette. Mediastinal contours and pulmonary vascularity normal. Peribronchial thickening and mild bibasilar atelectasis. No definite acute infiltrate or pneumothorax. Tiny bibasilar effusions blunt the posterior costophrenic angles. Mild scattered endplate spur formation thoracic spine. IMPRESSION: Bronchitic changes with mild bibasilar atelectasis and tiny pleural effusions. Electronically Signed   By: Lavonia Dana M.D.   On: 07/03/2019 18:06   CT Angio Chest PE W and/or Wo Contrast  Result Date: 07/03/2019 CLINICAL DATA:  PE suspected, high pretest  probability. EXAM: CT ANGIOGRAPHY CHEST WITH CONTRAST TECHNIQUE: Multidetector CT imaging of the chest was performed using the standard protocol during bolus administration of intravenous contrast. Multiplanar CT image reconstructions and MIPs were obtained to evaluate the vascular anatomy. CONTRAST:  65m OMNIPAQUE IOHEXOL 350 MG/ML SOLN COMPARISON:  Chest radiograph 07/03/2019, CT chest 04/05/2019 FINDINGS: Cardiovascular: Satisfactory opacification the pulmonary arteries to the segmental level. No pulmonary artery filling defects are identified. Normal caliber central pulmonary arteries. No elevation of the RV/LV ratio (0.6). Cardiac size at the upper limits of normal. Moderate pericardial effusion. Thoracic aorta is normal caliber. Normal 3 vessel branching of the arch. Minimal atherosclerotic plaque throughout the aorta and branch vessels. Luminal evaluation precluded by suboptimal contrast opacification. Mediastinum/Nodes: Scattered low-attenuation mediastinal and hilar nodes are present. No pathologically enlarged mediastinal, hilar or axillary lymph nodes are seen. Thyroid gland and thoracic inlet are unremarkable. No acute abnormality of the trachea or esophagus. Lungs/Pleura: Multifocal areas of interstitial and airspace opacity with a peripheral predominance seen throughout both lungs. Some interlobular septal thickening is noted in the lung apices. There are small moderate bilateral pleural effusions, right slightly greater than left with extension into the fissures. No pneumothorax. Upper Abdomen: No acute abnormalities present in the visualized portions of the upper abdomen. Musculoskeletal: Multilevel degenerative changes are present in the imaged portions of the spine. Multilevel flowing anterior osteophytosis, compatible with features of diffuse idiopathic skeletal hyperostosis (DISH). Additional mild degenerative changes in the shoulders. Review of the MIP images confirms the above findings.  IMPRESSION: 1. No evidence of pulmonary embolus. Multifocal areas of interstitial and airspace opacity with septal thickening bilateral pleural effusions, and pericardial effusion is worrisome for an acute infection process with superimposed edema/heart failure. 2. Scattered subcentimeter low-attenuation mediastinal and hilar adenopathy, likely reactive. 3.  Aortic Atherosclerosis (ICD10-I70.0). Electronically Signed   By: PLovena LeM.D.   On: 07/03/2019 20:11   DG Chest Port 1 View  Result Date: 07/05/2019 CLINICAL DATA:  Shortness of breath, respiratory failure EXAM: PORTABLE CHEST 1 VIEW COMPARISON:  07/04/2019 FINDINGS: Persistent bilateral opacities including dense retrocardiac consolidation. There is worsening lung aeration. Probable small bilateral pleural effusions. Stable cardiomediastinal  contours. IMPRESSION: Bilateral opacities with worsening lung aeration since 07/04/2019 Electronically Signed   By: Macy Mis M.D.   On: 07/05/2019 09:12   DG Chest Port 1 View  Result Date: 07/04/2019 CLINICAL DATA:  Pneumothorax, history of hypertension EXAM: PORTABLE CHEST 1 VIEW COMPARISON:  07/04/2019 FINDINGS: Heart size remains enlarged. There is signs of bilateral pleural effusion. No signs of pneumothorax. Interstitial and airspace opacities throughout both right and left chest persist. Worsening opacification particularly at the left lung base, to a lesser extent on the right. No acute bone finding. IMPRESSION: 1. Increasing basilar opacification may represent enlarging effusions are worsening consolidative changes particularly on the left. 2. Cardiomegaly as before. 3. No signs of pneumothorax. 4. Small bilateral effusions. Electronically Signed   By: Zetta Bills M.D.   On: 07/04/2019 20:39   DG Chest Port 1 View  Result Date: 07/04/2019 CLINICAL DATA:  Status post thoracentesis, bedside EXAM: PORTABLE CHEST 1 VIEW COMPARISON:  CT angiogram chest 07/03/2019, chest radiograph  07/03/2019. FINDINGS: Unchanged cardiomegaly. Interstitial and ill-defined airspace opacities throughout both lungs have increased from prior chest radiograph 07/03/2019. Persistent although decreased left basilar opacity which may reflect incomplete atelectasis, consolidation and/or minimal residual pleural effusion. A curvilinear vertically oriented line projects over the left hemithorax. This may reflect a prominent skin fold (lung markings are seen distally) or pneumothorax. As per the radiographer, the ordering clinician is aware of this finding at the time of interpretation. No evidence of acute bony abnormality. Overlying cardiac monitoring leads. IMPRESSION: A curvilinear vertically oriented line projects over the left hemithorax, which may reflect a prominent skin fold (lung markings are seen distally) or pneumothorax. Patient repositioning with repeat radiograph recommended. Interstitial and ill-defined airspace opacities throughout both lungs have increased from prior chest radiograph 07/03/2019. Findings may reflect worsening pulmonary edema or infection. Persistent although decreased opacity at the left lung base which may reflect incomplete atelectasis, consolidation and/or minimal residual pleural effusion. Unchanged cardiomegaly. Electronically Signed   By: Kellie Simmering DO   On: 07/04/2019 14:13   DG Chest Port 1V same Day  Result Date: 07/04/2019 CLINICAL DATA:  Follow-up for possible pneumothorax status post thoracentesis. EXAM: PORTABLE CHEST 1 VIEW COMPARISON:  Chest radiograph performed earlier the same day 07/04/2019, CT angiogram chest 07/03/2019. FINDINGS: Unchanged cardiomegaly. A previously questioned left-sided pneumothorax is not well appreciated on the current study. Persistent mild left basilar opacity which may reflect atelectasis, consolidation and/or small residual pleural effusion. Unchanged bilateral interstitial and ill-defined airspace opacities throughout both lungs.  Findings may reflect pulmonary edema or infection. Overlying cardiac monitoring leads. IMPRESSION: A previously questioned left-sided pneumothorax is not well appreciated on the current study and may have reflected a skin fold. 6 hour chest x-ray follow-up is recommended for confirmation. Otherwise, no significant interval change as compared to chest radiograph performed earlier the same day at 1:36 p.m. Electronically Signed   By: Kellie Simmering DO   On: 07/04/2019 15:29   ECHOCARDIOGRAM LIMITED  Result Date: 07/04/2019   ECHOCARDIOGRAM LIMITED REPORT   Patient Name:   LARUA COLLIER Date of Exam: 07/03/2019 Medical Rec #:  [id number]    Height:       66.0 in Accession #:    [id number]   Weight:       117.0 lb Date of Birth:  Oct 25, 1950    BSA:          1.59 m Patient Age:    68 years     BP:  175/111 mmHg Patient Gender: F            HR:           108 bpm. Exam Location:  Inpatient  Procedure: Limited Echo, Limited Color Doppler and Cardiac Doppler                     STAT ECHO Reported to: Dr. Marletta Lor on 07/03/2019 10:40:00 PM. Indications:     pericardial effusion  History:         Patient has prior history of Echocardiogram examinations, most                  recent 03/28/2019.  Sonographer:     Johny Chess Referring Phys:  [id number] Milus Banister Diagnosing Phys: Sanda Klein MD IMPRESSIONS  1. Mild global left ventricular hypokinesis with LV EF approximately 50%.  2. The left ventricle has no regional wall motion abnormalities.  3. Left ventricular diastolic parameters are consistent with Grade II diastolic dysfunction (pseudonormalization).  4. Elevated left atrial pressure.  5. Moderate circumferential pericardial effusion.  6. There is inversion of the right atrial wall. No other signs of tamponade are identified.  7. The tricuspid valve is grossly normal. Tricuspid valve regurgitation is moderate.  8. The pulmonic valve was not well visualized. Pulmonic valve regurgitation is not visualized.   9. Severely elevated pulmonary artery systolic pressure. 10. The tricuspid regurgitant velocity is 3.85 m/s, and with an assumed right atrial pressure of 8 mmHg, the estimated right ventricular systolic pressure is severely elevated at 67.3 mmHg. 11. The inferior vena cava is normal in size with <50% respiratory variability, suggesting right atrial pressure of 8 mmHg. 12. Aortic valve regurgitation not evaluated. 13. The aortic valve is grossly normal. Aortic valve regurgitation not evaluated. 14. Left ventricular ejection fraction, by visual estimation, is 50 to 55%. The left ventricle has low normal function. There is no left ventricular hypertrophy. 15. The mitral valve is grossly normal. Moderate mitral valve regurgitation. 16. Global right ventricle has normal systolic function.The right ventricular size is normal. No increase in right ventricular wall thickness. 25. Compared to 04/05/2019, the left ventricular systolic function has diminished. The pericardial effusion is unchanged in size and there are still only incomplete findings to support tamponade. Mitral and tricuspid insufficiency are worse. The degree of pulmonary hypertension appears similar. 18. The diagnosis of pericardial tamponade has reduced sensitivity in the setting of severe pulmonary hypertension. Invasive hemodynamic assessment may be appropriate. FINDINGS  Left Ventricle: Left ventricular ejection fraction, by visual estimation, is 50 to 55%. The left ventricle has low normal function. The left ventricle has no regional wall motion abnormalities. The left ventricular internal cavity size was the left ventricle is normal in size. There is no increased left ventricular wall thickness. Left ventricular diastolic parameters are consistent with Grade II diastolic dysfunction (pseudonormalization). Elevated left atrial pressure. Mild global left ventricular hypokinesis with LV EF approximately 50%. Right Ventricle: The right ventricular size is  normal. No increase in right ventricular wall thickness. Global RV systolic function is has normal systolic function. The tricuspid regurgitant velocity is 3.85 m/s, and with an assumed right atrial pressure  of 8 mmHg, the estimated right ventricular systolic pressure is severely elevated at 67.3 mmHg. Left Atrium: Left atrial size was normal in size. Right Atrium: Right atrial size was normal in size. Right atrial pressure is estimated at 8 mmHg. Pericardium: A moderately sized pericardial effusion is present. The pericardial effusion is  circumferential. There is inversion of the right atrial wall. Mitral Valve: The mitral valve is normal in structure. Moderate mitral valve regurgitation, with centrally-directed jet. Tricuspid Valve: The tricuspid valve is grossly normal. Tricuspid valve regurgitation moderate. Aortic Valve: The aortic valve is grossly normal. The aortic valve is normal in structure. Aortic valve regurgitation not evaluated. Pulmonic Valve: The pulmonic valve was not well visualized. Pulmonic valve regurgitation is not visualized. Aorta: The aortic root is normal in size and structure. Venous: The inferior vena cava is normal in size with less than 50% respiratory variability, suggesting right atrial pressure of 8 mmHg. Additional Comments: The diagnosis of pericardial tamponade has reduced sensitivity in the setting of severe pulmonary hypertension. Invasive hemodynamic assessment may be appropriate.  LEFT VENTRICLE         Normals PLAX 2D LVIDd:         5.09 cm 3.6 cm LVIDs:         3.56 cm 1.7 cm LV PW:         1.14 cm 1.4 cm LV IVS:        1.02 cm 1.3 cm LV SV:         70 ml   79 ml LV SV Index:   44.77   45 ml/m2  LEFT ATRIUM         Index LA diam:    3.55 cm 2.23 cm/m  TRICUSPID VALVE             Normals TR Peak grad:   59.3 mmHg TR Vmax:        385.00 cm/s 288 cm/s  Dani Gobble Croitoru MD Electronically signed by Sanda Klein MD Signature Date/Time: 07/04/2019/8:55:54 AMThe mitral valve is  normal in structure.    Final (Updated)     Cardiac Studies   Echocardiogram 07/03/2019: Moderate to large pericardial effusion circumferential with periodic diastolic invagination of the right atrial free wall, IVC does collapse, normal EF, elevated pulmonary pressures  Upper and lower extremity ABIs were normal secondary to blue fingers and toes  CT of abdomen pelvis on 04/05/2019 was unremarkable  Patient Profile     68 y.o. female with anasarca thought to be liver related with elevated pulmonary pressures normal EF chronic pericardial effusion pleural effusion negative PE increased dyspnea on exertion unremarkable thyroid function negative proteinuria  Assessment & Plan    Anasarca/ dyspnea/pleural effusions bilaterally/moderate to large circumferential pericardial effusion/hypertension/Covid negative -In review of prior notes, presumably secondary to liver disease diagnosed at [facility] but this has been a rather questionable diagnosis, extensive work-up by gastroenterology as well. -No PE -Pleural effusions present-bilateral thoracentesis performed on 07/04/2019 700 cc bilaterally. -Personally reviewed her echocardiogram, I do not see any evidence of RV collapse, IVC does collapse.  Minimal respiratory variation in TV/MV continuous-wave Doppler.  She does have periodic invagination of the right atrial free wall. -Her blood pressures have improved this morning but originally were in the 469G to 295M systolic with diastolics as high as 841.  This does not seem compatible with cardiac tamponade. -After 1.5 L out of lungs, and 2L urine creatinine is slightly increased from yesterday 1.7.  I will hold her Lasix and spironolactone today. -Extremely complex prior medical history with multiple outpatient consultants. -Thyroid functions were overall normal, no evidence of myxedema. -Inflammatory markers are elevated -Albumin is mildly reduced 3 -Abdominal ultrasound is unremarkable except for  evidence of contrast in her IVC which is secondary to elevated right ventricular/right atrial pressures -She is also requesting  her gabapentin for itchy lower extremities.  Continue with sertraline. -I think it makes sense for [place] to perform a right and left heart catheterization minus coronary injection to evaluate her hemodynamics, restriction/constriction etc.  I have discussed with the interventional team, Dr. Haroldine Laws.  We will go ahead and order a PYP scan to elucidate the possibility of T TR amyloid.  Tomorrow we will proceed with right and left heart catheterization with Dr. Haroldine Laws.  Protein calorie malnutrition -BMI is currently 18.  Support protein load. Wasting.   Anemia of chronic disease -Chronically low hemoglobin.  CRITICAL CARE Performed by: Candee Furbish   Total critical care time: 50 minutes  Critical care time was exclusive of separately billable procedures and treating other patients.  Critical care was necessary to treat or prevent imminent or life-threatening deterioration.  Critical care was time spent personally by me on the following activities: development of treatment plan with patient and/or surrogate as well as nursing, discussions with consultants, evaluation of patient's response to treatment, examination of patient, obtaining history from patient or surrogate, ordering and performing treatments and interventions, ordering and review of laboratory studies, ordering and review of radiographic studies, pulse oximetry and re-evaluation of patient's condition.   For questions or updates, please contact [place] Please consult [email or web address] for contact info under        Signed, Candee Furbish, MD  07/05/2019, 9:37 AM

## 2019-07-05 NOTE — Progress Notes (Addendum)
NAME:  Joanna Ali, MRN:  [id number], DOB:  March 14, 1951, LOS: 1 ADMISSION DATE:  07/03/2019, CONSULTATION DATE:  07/04/2019 REFERRING MD:  Dr. Meda Coffee, CHIEF COMPLAINT:  Tachycardia, hypertension, tachypnea  Brief History   68 year old female with several month history of anasarca, known pericardial effusion, pleural effusions, and intermittent dyspnea presenting with worsening dyspnea and fatigue.  Admitted by cardiology, found to be hypervolemic with new AKI but with ongoing tachycardia, tachypnea, respiratory distress/ platypnea- orthodeoxia with essentially unchanged pericardial effusion, PCCM called for evaluation.   History of present illness    68 year old female with history of chronic pericardial effusion, moderate AVR, HTN, former smoker (quit 1980), hepatic steatosis, peripheral neuropathy presenting with one week history of progressive dyspnea and fatigue.    On chart review, patient being thoroughly worked up for progressive dyspnea, bluish discoloration of fingers and toes, increasing LE swelling progressing to diffuse anasarca with multiple admissions for evaluation and diuresis since September.  Ruled out for IE. Was seen by outside rheumatology back in September 2020 with reported negative CRP, ESR RF, anti-Jo/Ro, ANCA, ANA, and SPEP; did have reported M spike.  Thus far, her anasarca, bilateral pleural effusions, and pericardial effusion has been attributed to her hypoalbuminemia.    Past Medical History  Chronic pericardial effusion, moderate AVR, HTN, osteoarthritis, macular degeneration, anasarca, hepatic steatosis, peripheral neuropathy  Significant Hospital Events   12/14 Admitted  Consults:  Cards PCCM  Procedures:  12/15 Foley   Significant Diagnostic Tests:  12/14 CTA PE >> 1. No evidence of pulmonary embolus. Multifocal areas of interstitial and airspace opacity with septal thickening bilateral pleural effusions, and pericardial effusion is worrisome for an  acute infection process with superimposed edema/heart failure. 2. Scattered subcentimeter low-attenuation mediastinal and hilar adenopathy, likely reactive. 3.  Aortic Atherosclerosis   12/14 TTE  >> 1. Mild global left ventricular hypokinesis with LV EF approximately 50%.  2. The left ventricle has no regional wall motion abnormalities.  3. Left ventricular diastolic parameters are consistent with Grade II diastolic dysfunction (pseudonormalization).  4. Elevated left atrial pressure.  5. Moderate circumferential pericardial effusion.  6. There is inversion of the right atrial wall. No other signs of tamponade are identified.  7. The tricuspid valve is grossly normal. Tricuspid valve regurgitation is moderate.  8. The pulmonic valve was not well visualized. Pulmonic valve regurgitation is not visualized.  9. Severely elevated pulmonary artery systolic pressure. 10. The tricuspid regurgitant velocity is 3.85 m/s, and with an assumed right atrial pressure of 8 mmHg, the estimated right ventricular systolic pressure is severely elevated at 67.3 mmHg. 11. The inferior vena cava is normal in size with <50% respiratory variability, suggesting right atrial pressure of 8 mmHg. 12. Aortic valve regurgitation not evaluated. 13. The aortic valve is grossly normal. Aortic valve regurgitation not evaluated. 14. Left ventricular ejection fraction, by visual estimation, is 50 to 55%. The left ventricle has low normal function. There is no left ventricular hypertrophy. 15. The mitral valve is grossly normal. Moderate mitral valve regurgitation. 16. Global right ventricle has normal systolic function.The right ventricular size is normal. No increase in right ventricular wall thickness. 6. Compared to 04/05/2019, the left ventricular systolic function has diminished. The pericardial effusion is unchanged in size and there are still only incomplete findings to support tamponade. Mitral and tricuspid  insufficiency are worse. The degree  of pulmonary hypertension appears similar. 18. The diagnosis of pericardial tamponade has reduced sensitivity in the setting of severe pulmonary hypertension.  Invasive hemodynamic assessment may be appropriate.   Micro Data:  12/14 BC x 2 >> 12/14 RVP >> neg 12/14 SARS2 >> neg 12/15 SARS2 >> neg 12/15 MRSA PCR >> neg  Antimicrobials:  n/a  Interim history/subjective:  Feels a little more short of breath Objective   Blood pressure 125/86, pulse (Abnormal) 101, temperature (Abnormal) 96.8 F (36 C), temperature source Axillary, resp. rate 19, height 5' 6"  (1.676 m), weight 50.3 kg, SpO2 100 %.        Intake/Output Summary (Last 24 hours) at 07/05/2019 0934 Last data filed at 07/05/2019 3832 Gross per 24 hour  Intake 978.35 ml  Output 2070 ml  Net -1091.65 ml   Filed Weights   07/04/19 0600 07/05/19 0500  Weight: 51.9 kg 50.3 kg    Examination:  General this is a pleasant 68 year old white female she is sitting upright in bed she is in no acute distress on 4 L however is speaking in 5-6 word phrases \HEENT normocephalic atraumatic no jugular venous distention mucous membranes moist Pulmonary: Posterior rales no accessory use currently Cardiac: Tachycardic regular rhythm Abdomen: Soft nontender Extremities: Warm and dry chronic venous stasis changing and discoloration Neuro: Awake oriented no focal deficits.  Resolved Hospital Problem list    Assessment & Plan:  68 year old female w/ massive anasarca and protein calorie malnutrition. Has had extensive rheum, onc and GI eval. Still no unifying dx as to etiology. PCCM asked to see for worsening dyspnea and hypoxia from volume overload and bilateral effusions   Acute hypoxic respiratory distress -2/2 global volume overload (pericardial and bilateral pleural effusions) & severe protein cal malnutrition -s/p bilateral thoracentesis (transudate) -pcxr w/ worsening aeration. Effusions  bilateral w/ pulm edema. No PTX  Plan Reassess for diuretics Am cxr->may need repeat thora F/u cytology but should be neg  Wean oxygen  IS  Left PTX s/p thora. Suspect this reflects incomplete re-expansion as resolved  Plan Am cxr  PH, FTT, severe protein calorie malnutrition, Pericardial effusion- chronic, moderate to large and circumferential without tamponade physiology on TTE 12/14 PM - etiology unclear, attributed to hypoalbuminemia thus far, mostly negative outside rheumatology workup in September except noted for M spike, elevated kappa free light chain Plan Cont supportive care Supportive care For nuclear med scan today to r/o amyloid Eventually RH and left heart cath  AKI w/ severe total body overload Plan Cont diuresis as BP/BUN/cr will allow (holding today)  Anemia Anemia panel wnl except ferritin level 329 Plan Trend cbc   Best practice:  Diet: NPO Pain/Anxiety/Delirium protocol (if indicated): n/a VAP protocol (if indicated): n/a DVT prophylaxis: SCDs GI prophylaxis: n/a Glucose control: trend on BMP Mobility: BR Code Status: full  Family Communication: patient and husband updated at bedside on plan of care Disposition: ICU     Erick Colace ACNP-BC Eugenio Saenz Pager # [phone] OR # [phone] if no answer

## 2019-07-05 NOTE — Progress Notes (Signed)
Initial Nutrition Assessment  DOCUMENTATION CODES:   Severe malnutrition in context of chronic illness, Underweight  INTERVENTION:    Ensure Enlive po TID, each supplement provides 350 kcal and 20 grams of protein  MVI daily   NUTRITION DIAGNOSIS:   Severe Malnutrition related to chronic illness(pericardial effusion w/ volume overload) as evidenced by moderate fat depletion, severe muscle depletion, percent weight loss.  GOAL:   Patient will meet greater than or equal to 90% of their needs  MONITOR:   PO intake, Supplement acceptance, Weight trends, Labs, I & O's, Skin  REASON FOR ASSESSMENT:   Malnutrition Screening Tool, Consult Assessment of nutrition requirement/status  ASSESSMENT:   Patient with PMH significant for chronic pericardial effusion, moderate AVR, HTN, hepatic steatosis, and peripheral neuropathy. Presents this admission with anasarca and bilateral pleural effusions.   12/15- s/p bilateral thoracentesis- 700 ml fluid removed   Pt discussed during ICU rounds and with RN.   Plan R/L heart cath tomorrow.   Pt reports a loss of appetite starting in August due to fluid retention. During this time she consumed 3 meals daily but was only able to complete 25-50% of each meal. Appetite picked up in November and has been normal since. In a typically day she eats 3 small meals that consist of B- oatmeal bagel with salmon, muscle milk (1 carton) L- sandwich or soup D- meat, vegetable, and grain. Discussed the importance of high calorie high protein supplementation to promote weight gain vs low calorie high protein supplementation. Pt amendable to Ensure this admission. Diet advanced to regular this am.   Pt endorses a UBW of 138 lb and an initial weight loss of 25 lb in August. States she gained weight back to 117 lb but finds it difficult to get to her UBW. Records indicate pt weighed 142 lb on 9/15 and 111 lb this admission (21.9% wt loss in 3 months, significant for  time frame). Suspect weight is lower as pt continues to diurese.    I/O: -636 ml since admit UOP: 2,070 ml x 24 hrs   Medications: reviewed  Labs: Na 131 (L) Phosphorus 5.8 (H) Mg 1.6 (L) Cr 1.72-trending up   NUTRITION - FOCUSED PHYSICAL EXAM:    Most Recent Value  Orbital Region  Moderate depletion  Upper Arm Region  Severe depletion  Thoracic and Lumbar Region  Unable to assess  Buccal Region  Moderate depletion  Temple Region  Moderate depletion  Clavicle Bone Region  Severe depletion  Clavicle and Acromion Bone Region  Severe depletion  Scapular Bone Region  Unable to assess  Dorsal Hand  Severe depletion  Patellar Region  Severe depletion  Anterior Thigh Region  Severe depletion  Posterior Calf Region  Severe depletion  Edema (RD Assessment)  Moderate  Hair  Reviewed  Eyes  Reviewed  Mouth  Reviewed  Skin  Reviewed  Nails  Reviewed       Diet Order:   Diet Order            Diet regular Room service appropriate? Yes; Fluid consistency: Thin  Diet effective now              EDUCATION NEEDS:   Education needs have been addressed  Skin:  Skin Assessment: Skin Integrity Issues: Skin Integrity Issues:: Other (Comment) Other: bilateral legs cracking-dry  Last BM:  12/13  Height:   Ht Readings from Last 1 Encounters:  07/04/19 5' 6"  (1.676 m)    Weight:   Wt Readings from  Last 1 Encounters:  07/05/19 50.3 kg    Ideal Body Weight:  59.1 kg  BMI:  Body mass index is 17.92 kg/m.  Estimated Nutritional Needs:   Kcal:  1600-1800 kcal  Protein:  80-95 grams  Fluid:  >/= 1.6 L/day   Mariana Single RD, LDN Clinical Nutrition Pager # - [phone]

## 2019-07-05 NOTE — Progress Notes (Signed)
Called PA Bhagat regarding PRN Zofran for patient's nausea. Received verbal order.

## 2019-07-06 ENCOUNTER — Inpatient Hospital Stay (HOSPITAL_COMMUNITY): Payer: Medicare Other

## 2019-07-06 ENCOUNTER — Encounter (HOSPITAL_COMMUNITY)
Admission: EM | Disposition: A | Discharge status: Rehabilitation Facility | Payer: Self-pay | Source: Ambulatory Visit | Attending: Cardiology

## 2019-07-06 HISTORY — PX: RIGHT AND LEFT HEART CATH: CATH118262

## 2019-07-06 LAB — POCT I-STAT 7, (LYTES, BLD GAS, ICA,H+H)
Acid-Base Excess: 3 mmol/L — ABNORMAL HIGH (ref 0.0–2.0)
Bicarbonate: 27 mmol/L (ref 20.0–28.0)
Calcium, Ion: 1.17 mmol/L (ref 1.15–1.40)
HCT: 24 % — ABNORMAL LOW (ref 36.0–46.0)
Hemoglobin: 8.2 g/dL — ABNORMAL LOW (ref 12.0–15.0)
O2 Saturation: 98 %
Potassium: 3.7 mmol/L (ref 3.5–5.1)
Sodium: 131 mmol/L — ABNORMAL LOW (ref 135–145)
TCO2: 28 mmol/L (ref 22–32)
pCO2 arterial: 40.3 mmHg (ref 32.0–48.0)
pH, Arterial: 7.434 (ref 7.350–7.450)
pO2, Arterial: 95 mmHg (ref 83.0–108.0)

## 2019-07-06 LAB — POCT I-STAT EG7
Acid-Base Excess: 3 mmol/L — ABNORMAL HIGH (ref 0.0–2.0)
Acid-base deficit: 3 mmol/L — ABNORMAL HIGH (ref 0.0–2.0)
Bicarbonate: 22.5 mmol/L (ref 20.0–28.0)
Bicarbonate: 28 mmol/L (ref 20.0–28.0)
Calcium, Ion: 0.9 mmol/L — ABNORMAL LOW (ref 1.15–1.40)
Calcium, Ion: 1.18 mmol/L (ref 1.15–1.40)
HCT: 22 % — ABNORMAL LOW (ref 36.0–46.0)
HCT: 24 % — ABNORMAL LOW (ref 36.0–46.0)
Hemoglobin: 7.5 g/dL — ABNORMAL LOW (ref 12.0–15.0)
Hemoglobin: 8.2 g/dL — ABNORMAL LOW (ref 12.0–15.0)
O2 Saturation: 62 %
O2 Saturation: 62 %
Potassium: 2.9 mmol/L — ABNORMAL LOW (ref 3.5–5.1)
Potassium: 3.6 mmol/L (ref 3.5–5.1)
Sodium: 131 mmol/L — ABNORMAL LOW (ref 135–145)
Sodium: 134 mmol/L — ABNORMAL LOW (ref 135–145)
TCO2: 24 mmol/L (ref 22–32)
TCO2: 29 mmol/L (ref 22–32)
pCO2, Ven: 39.7 mmHg — ABNORMAL LOW (ref 44.0–60.0)
pCO2, Ven: 44.7 mmHg (ref 44.0–60.0)
pH, Ven: 7.36 (ref 7.250–7.430)
pH, Ven: 7.405 (ref 7.250–7.430)
pO2, Ven: 32 mmHg (ref 32.0–45.0)
pO2, Ven: 34 mmHg (ref 32.0–45.0)

## 2019-07-06 LAB — CBC
HCT: 26.4 % — ABNORMAL LOW (ref 36.0–46.0)
Hemoglobin: 8.5 g/dL — ABNORMAL LOW (ref 12.0–15.0)
MCH: 28.2 pg (ref 26.0–34.0)
MCHC: 32.2 g/dL (ref 30.0–36.0)
MCV: 87.7 fL (ref 80.0–100.0)
Platelets: 211 10*3/uL (ref 150–400)
RBC: 3.01 MIL/uL — ABNORMAL LOW (ref 3.87–5.11)
RDW: 16.5 % — ABNORMAL HIGH (ref 11.5–15.5)
WBC: 10.8 10*3/uL — ABNORMAL HIGH (ref 4.0–10.5)
nRBC: 0 % (ref 0.0–0.2)

## 2019-07-06 LAB — BASIC METABOLIC PANEL
Anion gap: 15 (ref 5–15)
BUN: 46 mg/dL — ABNORMAL HIGH (ref 8–23)
CO2: 24 mmol/L (ref 22–32)
Calcium: 8.9 mg/dL (ref 8.9–10.3)
Chloride: 92 mmol/L — ABNORMAL LOW (ref 98–111)
Creatinine, Ser: 2.03 mg/dL — ABNORMAL HIGH (ref 0.44–1.00)
GFR calc Af Amer: 28 mL/min — ABNORMAL LOW (ref >60)
GFR calc non Af Amer: 24 mL/min — ABNORMAL LOW (ref >60)
Glucose, Bld: 102 mg/dL — ABNORMAL HIGH (ref 70–99)
Potassium: 3.8 mmol/L (ref 3.5–5.1)
Sodium: 131 mmol/L — ABNORMAL LOW (ref 135–145)

## 2019-07-06 LAB — TYPE AND SCREEN
ABO/RH(D): O POS
Antibody Screen: NEGATIVE

## 2019-07-06 LAB — ABO/RH: ABO/RH(D): O POS

## 2019-07-06 SURGERY — RIGHT AND LEFT HEART CATH

## 2019-07-06 MED ORDER — LABETALOL HCL 5 MG/ML IV SOLN: 10.0000 mg | INTRAVENOUS | Status: AC | PRN

## 2019-07-06 MED ORDER — FENTANYL CITRATE (PF) 100 MCG/2ML IJ SOLN
INTRAMUSCULAR | Status: DC | PRN
  Administered 2019-07-06: 25 ug via INTRAVENOUS

## 2019-07-06 MED ORDER — HEPARIN SODIUM (PORCINE) 1000 UNIT/ML IJ SOLN
INTRAMUSCULAR | Status: DC | PRN
  Administered 2019-07-06: 2500 [IU] via INTRAVENOUS

## 2019-07-06 MED ORDER — SODIUM CHLORIDE 0.9 % IV SOLN: INTRAVENOUS | Status: DC

## 2019-07-06 MED ORDER — ENOXAPARIN SODIUM 30 MG/0.3ML ~~LOC~~ SOLN
30.0000 mg | SUBCUTANEOUS | Status: DC
  Administered 2019-07-07 – 2019-07-12 (×6): 30 mg via SUBCUTANEOUS
  Filled 2019-07-06 (×6): qty 0.3

## 2019-07-06 MED ORDER — HYDRALAZINE HCL 20 MG/ML IJ SOLN: 10.0000 mg | INTRAMUSCULAR | Status: AC | PRN

## 2019-07-06 MED ORDER — LIDOCAINE HCL (PF) 1 % IJ SOLN
INTRAMUSCULAR | Status: DC | PRN
  Administered 2019-07-06: 1 mL
  Administered 2019-07-06: 2 mL

## 2019-07-06 MED ORDER — SODIUM CHLORIDE 0.9% FLUSH
3.0000 mL | Freq: Two times a day (BID) | INTRAVENOUS | Status: DC
  Administered 2019-07-06 – 2019-07-30 (×36): 3 mL via INTRAVENOUS
  Administered 2019-07-30: 10 mL via INTRAVENOUS
  Administered 2019-07-31 – 2019-08-18 (×34): 3 mL via INTRAVENOUS

## 2019-07-06 MED ORDER — HEPARIN (PORCINE) IN NACL 1000-0.9 UT/500ML-% IV SOLN
INTRAVENOUS | Status: AC
  Filled 2019-07-06: qty 1000

## 2019-07-06 MED ORDER — ACETAMINOPHEN 325 MG PO TABS: 650.0000 mg | ORAL_TABLET | ORAL | Status: DC | PRN

## 2019-07-06 MED ORDER — ASPIRIN 81 MG PO CHEW
81.0000 mg | CHEWABLE_TABLET | ORAL | Status: AC
  Administered 2019-07-06: 81 mg via ORAL
  Filled 2019-07-06: qty 1

## 2019-07-06 MED ORDER — LIDOCAINE HCL (PF) 1 % IJ SOLN
INTRAMUSCULAR | Status: AC
  Filled 2019-07-06: qty 30

## 2019-07-06 MED ORDER — VERAPAMIL HCL 2.5 MG/ML IV SOLN
INTRAVENOUS | Status: DC | PRN
  Administered 2019-07-06: 10 mL via INTRA_ARTERIAL

## 2019-07-06 MED ORDER — MIDAZOLAM HCL 2 MG/2ML IJ SOLN
INTRAMUSCULAR | Status: AC
  Filled 2019-07-06: qty 2

## 2019-07-06 MED ORDER — SODIUM CHLORIDE 0.9 % IV SOLN: 250.0000 mL | INTRAVENOUS | Status: DC | PRN

## 2019-07-06 MED ORDER — ENOXAPARIN SODIUM 40 MG/0.4ML ~~LOC~~ SOLN: 40.0000 mg | SUBCUTANEOUS | Status: DC

## 2019-07-06 MED ORDER — HEPARIN (PORCINE) IN NACL 1000-0.9 UT/500ML-% IV SOLN
INTRAVENOUS | Status: DC | PRN
  Administered 2019-07-06: 500 mL

## 2019-07-06 MED ORDER — MIDAZOLAM HCL 2 MG/2ML IJ SOLN
INTRAMUSCULAR | Status: DC | PRN
  Administered 2019-07-06: 1 mg via INTRAVENOUS

## 2019-07-06 MED ORDER — VERAPAMIL HCL 2.5 MG/ML IV SOLN
INTRAVENOUS | Status: AC
  Filled 2019-07-06: qty 2

## 2019-07-06 MED ORDER — ONDANSETRON HCL 4 MG/2ML IJ SOLN: 4.0000 mg | Freq: Four times a day (QID) | INTRAMUSCULAR | Status: DC | PRN

## 2019-07-06 MED ORDER — HEPARIN SODIUM (PORCINE) 1000 UNIT/ML IJ SOLN
INTRAMUSCULAR | Status: AC
  Filled 2019-07-06: qty 1

## 2019-07-06 MED ORDER — SODIUM CHLORIDE 0.9% FLUSH
3.0000 mL | INTRAVENOUS | Status: DC | PRN
  Administered 2019-07-15 – 2019-08-18 (×2): 3 mL via INTRAVENOUS

## 2019-07-06 MED ORDER — FENTANYL CITRATE (PF) 100 MCG/2ML IJ SOLN
INTRAMUSCULAR | Status: AC
  Filled 2019-07-06: qty 2

## 2019-07-06 SURGICAL SUPPLY — 12 items
CATH BALLN WEDGE 5F 110CM (CATHETERS) ×1 IMPLANT
CATH INFINITI MULTIPACK ANG 4F (CATHETERS) ×1 IMPLANT
DEVICE RAD TR BAND REGULAR (VASCULAR PRODUCTS) ×2 IMPLANT
GLIDESHEATH SLEND SS 6F .021 (SHEATH) ×1 IMPLANT
GUIDEWIRE .025 260CM (WIRE) ×1 IMPLANT
GUIDEWIRE INQWIRE 1.5J.035X260 (WIRE) IMPLANT
INQWIRE 1.5J .035X260CM (WIRE) ×2
PACK CARDIAC CATHETERIZATION (CUSTOM PROCEDURE TRAY) ×2 IMPLANT
SHEATH GLIDE SLENDER 4/5FR (SHEATH) ×1 IMPLANT
TRANSDUCER W/STOPCOCK (MISCELLANEOUS) ×3 IMPLANT
TUBING ART PRESS 72  MALE/FEM (TUBING) ×1
TUBING ART PRESS 72 MALE/FEM (TUBING) IMPLANT

## 2019-07-06 NOTE — Progress Notes (Signed)
   Dr. Haroldine Laws to perform right and left heart cath today.  Note mild rising creatinine.  She has been off of Lasix and spironolactone since yesterday.  PYP scan was negative for transthyretin amyloidosis.  Candee Furbish, MD

## 2019-07-06 NOTE — Interval H&P Note (Signed)
History and Physical Interval Note:  07/06/2019 10:17 AM  Joanna Ali  has presented today for surgery, with the diagnosis of pericardial effusion possible constriction  The various methods of treatment have been discussed with the patient and family. After consideration of risks, benefits and other options for treatment, the patient has consented to  Right ant left heart cath as a surgical intervention.  The patient's history has been reviewed, patient examined, no change in status, stable for surgery.  I have reviewed the patient's chart and labs.  Questions were answered to the patient's satisfaction.     Joanna Ali

## 2019-07-06 NOTE — H&P (View-Only) (Signed)
   Dr. Haroldine Laws to perform right and left heart cath today.  Note mild rising creatinine.  She has been off of Lasix and spironolactone since yesterday.  PYP scan was negative for transthyretin amyloidosis.  Candee Furbish, MD

## 2019-07-06 NOTE — Progress Notes (Signed)
 NAME:  Joanna Ali, MRN:  [id number], DOB:  06/19/1951, LOS: 2 ADMISSION DATE:  07/03/2019, CONSULTATION DATE:  07/04/2019 REFERRING MD:  Dr. Nelson, CHIEF COMPLAINT:  Tachycardia, hypertension, tachypnea  Brief History   68 year old female with several month history of anasarca, known pericardial effusion, pleural effusions, and intermittent dyspnea presenting with worsening dyspnea and fatigue.  Admitted by cardiology, found to be hypervolemic with new AKI but with ongoing tachycardia, tachypnea, respiratory distress/ platypnea- orthodeoxia with essentially unchanged pericardial effusion, PCCM called for evaluation.   History of present illness    68 year old female with history of chronic pericardial effusion, moderate AVR, HTN, former smoker (quit 1980), hepatic steatosis, peripheral neuropathy presenting with one week history of progressive dyspnea and fatigue.    On chart review, patient being thoroughly worked up for progressive dyspnea, bluish discoloration of fingers and toes, increasing LE swelling progressing to diffuse anasarca with multiple admissions for evaluation and diuresis since September.  Ruled out for IE. Was seen by outside rheumatology back in September 2020 with reported negative CRP, ESR RF, anti-Jo/Ro, ANCA, ANA, and SPEP; did have reported M spike.  Thus far, her anasarca, bilateral pleural effusions, and pericardial effusion has been attributed to her hypoalbuminemia.   Past Medical History  Chronic pericardial effusion, moderate AVR, HTN, osteoarthritis, macular degeneration, anasarca, hepatic steatosis, peripheral neuropathy  Significant Hospital Events   12/14 Admitted  Consults:  Cards PCCM  Procedures:  12/15 Foley   Significant Diagnostic Tests:  12/14 CTA PE >> No evidence of pulmonary embolus. Multifocal areas of interstitial and airspace opacity with septal thickening bilateral pleural effusions, and pericardial effusion is worrisome for an acute  infection process with superimposed edema/heart failure. Scattered subcentimeter low-attenuation mediastinal and hilar adenopathy, likely reactive. 12/14 TTE  >> Mild global left ventricular hypokinesis with LV EF approximately 50%. Grade II diastolic dysfunction (pseudonormalization). Elevated left atrial pressure. Moderate circumferential pericardial effusion. There is inversion of the right atrial wall. No other signs of tamponade are identified. Tricuspid valve regurgitation is moderate. Severely elevated pulmonary artery systolic pressure. Moderate mitral valve regurgitation. 12/16 PYP scan > not suggestive of amyloid 12/17 R/LHC >   Micro Data:  12/14 BC x 2 >> 12/14 RVP >> neg 12/14 SARS2 >> neg 12/15 SARS2 >> neg 12/15 MRSA PCR >> neg  Antimicrobials:  n/a  Interim history/subjective:  Feels about the same today. Breathes OK at rest, but with talking or any physical activity, she becomes short of breath.   Objective   Blood pressure (!) 146/102, pulse (!) 115, temperature 97.7 F (36.5 C), temperature source Oral, resp. rate (!) 27, height 5' 6" (1.676 m), weight 50 kg, SpO2 99 %.        Intake/Output Summary (Last 24 hours) at 07/06/2019 0833 Last data filed at 07/06/2019 0400 Gross per 24 hour  Intake 723 ml  Output 700 ml  Net 23 ml   Filed Weights   07/04/19 0600 07/05/19 0500 07/06/19 0500  Weight: 51.9 kg 50.3 kg 50 kg    Examination:  General:  Frail female in NAD Neuro:  Alert, oriented, non-focal HEENT:  [place]/AT, No JVD noted, PERRL Cardiovascular:  Tachy, regular, no MRG Lungs:  Rales in posterior bases. Dyspneic with conversation.  Abdomen:  Soft, non-distended, non-tender Musculoskeletal:  No acute deformity or ROM limitation. Trace LE edema.  Skin:  Intact, MMM    Resolved Hospital Problem list    Assessment & Plan:  68 year old female w/ massive anasarca   and protein calorie malnutrition. Has had extensive rheum, onc and GI eval. Still no  unifying dx as to etiology. PCCM asked to see for worsening dyspnea and hypoxia from volume overload and bilateral effusions   Acute hypoxic respiratory distress - secondary to global volume overload (pericardial and bilateral pleural effusions) & severe protein cal malnutrition -s/p bilateral thoracentesis (transudate), cytology negative - CXR somewhat improved this morning.  Plan Diuretics per cardiology No indication for repeat thoracentesis Incentive spirometer.  Wean oxygen   Left PTX s/p thora. Suspect this reflects incomplete re-expansion as resolved> improved on CXR Plan Monitor  PH, FTT, severe protein calorie malnutrition, Pericardial effusion- chronic, moderate to large and circumferential without tamponade physiology on TTE 12/14 PM - etiology unclear, attributed to hypoalbuminemia thus far, mostly negative outside rheumatology workup in September except noted for M spike, elevated kappa free light chain Nuc Med PYP not suggestive of amyloid.  Plan Cont supportive care Supportive care For [facility] today.   AKI w/ severe total body overload Plan Cont diuresis as BP/BUN/cr will allow  Anemia Anemia panel wnl except ferritin level 329 Plan Trend cbc   Best practice:  Diet: per primary Pain/Anxiety/Delirium protocol (if indicated): n/a VAP protocol (if indicated): n/a DVT prophylaxis: SCDs GI prophylaxis: n/a Glucose control: trend on BMP Mobility: BR    Code Status: full  Family Communication: Patient updated Disposition: ICU     Georgann Housekeeper, AGACNP-BC [place] for personal pager PCCM on call pager [phone]  07/06/2019 8:42 AM

## 2019-07-06 NOTE — Progress Notes (Signed)
Progress Note  [name] Date of Encounter: 07/06/2019  Primary Cardiologist: Evalina Field, MD   Subjective   Sitting up in chair, husband in room.  Post cardiac catheterization, minimal shortness of breath  Inpatient Medications    Scheduled Meds: . Chlorhexidine Gluconate Cloth  6 each Topical Daily  . [START ON 07/07/2019] enoxaparin (LOVENOX) injection  30 mg Subcutaneous Q24H  . feeding supplement (ENSURE ENLIVE)  237 mL Oral TID BM  . gabapentin  300 mg Oral TID  . mouth rinse  15 mL Mouth Rinse BID  . sertraline  25 mg Oral BID  . sodium chloride flush  3 mL Intravenous Q12H  . sodium chloride flush  3 mL Intravenous Q12H   Continuous Infusions: . sodium chloride 500 mL (07/06/19 1043)  . sodium chloride     PRN Meds: sodium chloride, sodium chloride, acetaminophen, camphor-menthol, hydrALAZINE, labetalol, ondansetron (ZOFRAN) IV, sodium chloride flush, sodium chloride flush, traMADol   Vital Signs    Vitals:   07/06/19 1119 07/06/19 1200 07/06/19 1300 07/06/19 1520  BP:   (!) 133/94   Pulse: (!) 0     Resp: (!) 0 (!) 25    Temp:    (!) 96.7 F (35.9 C)  TempSrc:    Axillary  SpO2: (!) 0%     Weight:      Height:        Intake/Output Summary (Last 24 hours) at 07/06/2019 1655 Last data filed at 07/06/2019 1300 Gross per 24 hour  Intake 460 ml  Output 700 ml  Net -240 ml   Last 3 Weights 07/06/2019 07/05/2019 07/04/2019  Weight (lbs) 110 lb 3.7 oz 111 lb 114 lb 6.7 oz  Weight (kg) 50 kg 50.349 kg 51.9 kg      Telemetry    Sinus rhythm/sinus tachycardia- Personally Reviewed  ECG    Sinus tachycardia no ischemic changes- Personally Reviewed  Physical Exam   GEN:  Cachectic, breathing has improved but still mildly labored Neck:  No sig JVD Cardiac:  Minimal lower extremity edema.   Respiratory:  Fairly normal appearing respiratory effort GI: minimally-distended  MS:  Minimal lower extremity edema, dry skin; No  deformity. Neuro:  Nonfocal  Psych: Normal affect   Labs    High Sensitivity Troponin:   Recent Labs  Lab 07/03/19 1739 07/03/19 1930  TROPONINIHS 149* 163*      Chemistry Recent Labs  Lab 07/03/19 1930 07/04/19 0822 07/04/19 1424 07/05/19 0245 07/06/19 0233  NA  --  133* 130* 131* 131*  K  --  3.7 3.3* 4.1 3.8  CL  --  95* 90* 92* 92*  CO2  --  10* 25 24 24   GLUCOSE  --  98 147* 107* 102*  BUN  --  36* 36* 38* 46*  CREATININE  --  1.50* 1.50* 1.72* 2.03*  CALCIUM  --  9.0 9.1 8.7* 8.9  PROT 6.4* 5.7* 6.2*  --   --   ALBUMIN 3.1* 2.9* 2.9*  --   --   AST 41 40  --   --   --   ALT 18 18  --   --   --   ALKPHOS 51 46  --   --   --   BILITOT 1.1 0.7  --   --   --   GFRNONAA  --  35* 35* 30* 24*  GFRAA  --  40* 40* 34* 28*  ANIONGAP  --  28* 15  15 15     Hematology Recent Labs  Lab 07/04/19 0409 07/04/19 1229 07/05/19 0245 07/06/19 0609  WBC 7.8  --  9.1 10.8*  RBC 2.91*  2.91*  --  2.97* 3.01*  HGB 8.1* 8.8* 8.1* 8.5*  HCT 25.9* 26.0* 25.6* 26.4*  MCV 89.0  --  86.2 87.7  MCH 27.8  --  27.3 28.2  MCHC 31.3  --  31.6 32.2  RDW 16.1*  --  16.0* 16.5*  PLT 204  --  191 211    BNP Recent Labs  Lab 07/03/19 1930  BNP 1,124.1*     DDimer  Recent Labs  Lab 07/03/19 2052  DDIMER 3.82*     Radiology    NM CARDIAC AMYLOID TUMOR LOC INFLAM SPECT 1 DAY  Result Date: 07/05/2019 CLINICAL DATA:  HEART FAILURE. CONCERN FOR CARDIAC AMYLOIDOSIS.68 y.o. female with anasarca thought to be liver related with elevated pulmonary pressures normal EF chronic pericardial effusion pleural effusion negative PE increased dyspnea on exertion unremarkable thyroid function negative proteinuria EXAM: NUCLEAR MEDICINE TUMOR LOCALIZATION. PYP CARDIAC AMYLOIDOSIS SCAN WITH SPECT TECHNIQUE: Following intravenous administration of radiopharmaceutical, anterior planar images of the chest were obtained. Regions of interest were placed on the heart and contralateral chest wall for  quantitative assessment. Additional SPECT imaging of the chest was obtained. RADIOPHARMACEUTICALS:  20.7 mCi TECHNETIUM 99 PYROPHOSPHATE FINDINGS: Planar Visual assessment: Anterior planar imaging demonstrates radiotracer uptake within the heart less than uptake within the adjacent ribs (Grade 1). Quantitative assessment : Quantitative assessment of the cardiac uptake compared to the contralateral chest wall is equal to 1.16 (H/CL = 1.1). SPECT assessment: SPECT imaging of the chest demonstrates trace radiotracer accumulation within the LEFT ventricle. IMPRESSION: Visual and quantitative assessment (grade 1, H/CLL equal 1.16) are NOT suggestive of transthyretin amyloidosis. Electronically Signed   By: Suzy Bouchard M.D.   On: 07/05/2019 17:45   CARDIAC CATHETERIZATION  Result Date: 07/06/2019 Baseline Findings: RA = 7 RV = 42/7 PA = 48/17 (28) PCW = 16 LV = 122/8 Fick cardiac output/index = 5.0/3.2 PVR = 2.4 WU FA sat = 98% PA sat = 62%, 62% Prominent y-descents in RA tracing but no square root sign in ventricular tracings and no RV/LV interdeprendence After 350 cc NS bolus RA = 9 RV = 46/9 PA = 48/24 (33) PCW = 23 LV = 125/15 Prominent y-descents in RA tracing but no square root sign in ventricular tracings and no RV/LV interdeprendence Assessment: 1. Moderate pericardial effusion 2. Mild PAH 3. 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  D/w Dr. Marlou Porch. If effusion becomes hemodynamically significant in future can consider window. Glori Bickers, MD 11:55 AM   DG Chest Port 1 View  Result Date: 07/06/2019 CLINICAL DATA:  Shortness of breath with pleural effusion EXAM: PORTABLE CHEST 1 VIEW COMPARISON:  Yesterday FINDINGS: Improving pulmonary opacification although there is still interstitial and airspace  opacity. Cardiopericardial enlargement with pericardial effusion by recent CT. There is also pleural effusions. No pneumothorax IMPRESSION: 1. Improving interstitial and airspace opacity, rapid changes likely related to improving edema. 2. Stable cardiopericardial enlargement. Left more than right pleural effusions. Electronically Signed   By: Monte Fantasia M.D.   On: 07/06/2019 07:50   DG Chest Port 1 View  Result Date: 07/05/2019 CLINICAL DATA:  Shortness of breath, respiratory failure EXAM: PORTABLE CHEST 1 VIEW  COMPARISON:  07/04/2019 FINDINGS: Persistent bilateral opacities including dense retrocardiac consolidation. There is worsening lung aeration. Probable small bilateral pleural effusions. Stable cardiomediastinal contours. IMPRESSION: Bilateral opacities with worsening lung aeration since 07/04/2019 Electronically Signed   By: Macy Mis M.D.   On: 07/05/2019 09:12   DG Chest Port 1 View  Result Date: 07/04/2019 CLINICAL DATA:  Pneumothorax, history of hypertension EXAM: PORTABLE CHEST 1 VIEW COMPARISON:  07/04/2019 FINDINGS: Heart size remains enlarged. There is signs of bilateral pleural effusion. No signs of pneumothorax. Interstitial and airspace opacities throughout both right and left chest persist. Worsening opacification particularly at the left lung base, to a lesser extent on the right. No acute bone finding. IMPRESSION: 1. Increasing basilar opacification may represent enlarging effusions are worsening consolidative changes particularly on the left. 2. Cardiomegaly as before. 3. No signs of pneumothorax. 4. Small bilateral effusions. Electronically Signed   By: Zetta Bills M.D.   On: 07/04/2019 20:39    Cardiac Studies   Echocardiogram 07/03/2019: Moderate to large pericardial effusion circumferential with periodic diastolic invagination of the right atrial free wall, IVC does collapse, normal EF, elevated pulmonary pressures  Upper and lower extremity ABIs were normal  secondary to blue fingers and toes  CT of abdomen pelvis on 04/05/2019 was unremarkable  Cardiac catheterization 07/06/2019:  Baseline  Findings:  RA = 7  RV = 42/7 PA = 48/17 (28) PCW = 16 LV = 122/8 Fick cardiac output/index = 5.0/3.2 PVR = 2.4 WU FA sat = 98% PA sat = 62%, 62%  Prominent y-descents in RA tracing but no square root sign in ventricular tracings and no RV/LV interdeprendence  After 350 cc NS bolus  RA = 9 RV = 46/9 PA = 48/24 (33) PCW = 23 LV = 125/15  Prominent y-descents in RA tracing but no square root sign in ventricular tracings and no RV/LV interdeprendence  Assessment: 1. Moderate pericardial effusion 2. Mild PAH 3. 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  D/w Dr. Marlou Porch. If effusion becomes hemodynamically significant in future can consider window.   Glori Bickers, MD   Patient Profile     68 y.o. female with anasarca thought to be liver related with elevated pulmonary pressures normal EF chronic pericardial effusion pleural effusion negative PE increased dyspnea on exertion unremarkable thyroid function negative proteinuria  Assessment & Plan    Anasarca/ dyspnea/pleural effusions bilaterally/moderate to large circumferential pericardial effusion/hypertension/Covid negative -In review of prior notes, presumably secondary to liver disease diagnosed at [facility] but this has been a rather questionable diagnosis, although she has steatosis. -No PE -Pleural effusions present-bilateral thoracentesis performed on 07/04/2019 700 cc bilaterally. -Personally reviewed her echocardiogram, I do not see any evidence of RV collapse, IVC does collapse.  Minimal respiratory variation in TV/MV continuous-wave Doppler.  She does have periodic  invagination of the right atrial free wall. -Both right and left heart cath overall reassuring with no evidence of constriction no evidence of tamponade.  No shunting.  Cardiac output 5 L/min, original left ventricular end-diastolic pressure 8 mmHg, mean pulmonary artery pressure 28 -Continuing to hold her Lasix and spironolactone after creatinine increase and findings on cardiac catheterization. -Extremely complex prior medical history with multiple outpatient consultants. -Thyroid functions were overall normal, no evidence of myxedema. -Inflammatory markers are elevated, CRP for instance -Albumin is reduced 3 -She is  also requesting her gabapentin for itchy lower extremities.  Continue with sertraline. -PYP scan was negative for transthyretin amyloidosis   Protein calorie malnutrition -BMI is currently 18.  Support protein load. Wasting.   Anemia of chronic disease -Chronically low hemoglobin.  Discussed at length with Dr. Audie Box as well as family  For questions or updates, please contact [facility] Please consult [email or web address] for contact info under        Signed, Candee Furbish, MD  07/06/2019, 4:55 PM

## 2019-07-07 DIAGNOSIS — J90 Pleural effusion, not elsewhere classified: Secondary | ICD-10-CM | POA: Insufficient documentation

## 2019-07-07 LAB — CBC
HCT: 24 % — ABNORMAL LOW (ref 36.0–46.0)
Hemoglobin: 7.5 g/dL — ABNORMAL LOW (ref 12.0–15.0)
MCH: 28.3 pg (ref 26.0–34.0)
MCHC: 31.3 g/dL (ref 30.0–36.0)
MCV: 90.6 fL (ref 80.0–100.0)
Platelets: 197 10*3/uL (ref 150–400)
RBC: 2.65 MIL/uL — ABNORMAL LOW (ref 3.87–5.11)
RDW: 17 % — ABNORMAL HIGH (ref 11.5–15.5)
WBC: 9.6 10*3/uL (ref 4.0–10.5)
nRBC: 0 % (ref 0.0–0.2)

## 2019-07-07 LAB — BODY FLUID CULTURE
Culture: NO GROWTH
Culture: NO GROWTH

## 2019-07-07 LAB — BASIC METABOLIC PANEL
Anion gap: 12 (ref 5–15)
BUN: 55 mg/dL — ABNORMAL HIGH (ref 8–23)
CO2: 25 mmol/L (ref 22–32)
Calcium: 8.8 mg/dL — ABNORMAL LOW (ref 8.9–10.3)
Chloride: 94 mmol/L — ABNORMAL LOW (ref 98–111)
Creatinine, Ser: 2.15 mg/dL — ABNORMAL HIGH (ref 0.44–1.00)
GFR calc Af Amer: 26 mL/min — ABNORMAL LOW (ref >60)
GFR calc non Af Amer: 23 mL/min — ABNORMAL LOW (ref >60)
Glucose, Bld: 105 mg/dL — ABNORMAL HIGH (ref 70–99)
Potassium: 3.6 mmol/L (ref 3.5–5.1)
Sodium: 131 mmol/L — ABNORMAL LOW (ref 135–145)

## 2019-07-07 NOTE — Progress Notes (Signed)
Progress Note  [name] Date of Encounter: 07/07/2019  Primary Cardiologist: Evalina Field, MD   Subjective   In bed, husband at bedside.  Several questions answered.  Mild shortness of breath.  Inpatient Medications    Scheduled Meds: . Chlorhexidine Gluconate Cloth  6 each Topical Daily  . enoxaparin (LOVENOX) injection  30 mg Subcutaneous Q24H  . feeding supplement (ENSURE ENLIVE)  237 mL Oral TID BM  . gabapentin  300 mg Oral TID  . mouth rinse  15 mL Mouth Rinse BID  . sertraline  25 mg Oral BID  . sodium chloride flush  3 mL Intravenous Q12H  . sodium chloride flush  3 mL Intravenous Q12H   Continuous Infusions: . sodium chloride 500 mL (07/06/19 1043)  . sodium chloride     PRN Meds: sodium chloride, sodium chloride, acetaminophen, camphor-menthol, ondansetron (ZOFRAN) IV, sodium chloride flush, sodium chloride flush, traMADol   Vital Signs    Vitals:   07/07/19 0700 07/07/19 0800 07/07/19 0900 07/07/19 1000  BP: (!) 155/104 (!) 149/103 (!) 157/106 (!) 154/94  Pulse: (!) 108 (!) 105 (!) 105 (!) 105  Resp: (!) 33 (!) 30 (!) 32 (!) 27  Temp:  97.6 F (36.4 C)    TempSrc:  Oral    SpO2: 100% 100% 100% 100%  Weight:      Height:        Intake/Output Summary (Last 24 hours) at 07/07/2019 1118 Last data filed at 07/07/2019 0800 Gross per 24 hour  Intake 700 ml  Output 1685 ml  Net -985 ml   Last 3 Weights 07/07/2019 07/06/2019 07/05/2019  Weight (lbs) 111 lb 12.4 oz 110 lb 3.7 oz 111 lb  Weight (kg) 50.7 kg 50 kg 50.349 kg      Telemetry    Sinus rhythm/sinus tachycardia- Personally Reviewed  ECG    Sinus tachycardia no ischemic changes- Personally Reviewed  Physical Exam   GEN:  Cachectic, breathing has improved but still mildly labored Neck:  No sig JVD Cardiac:  Minimal lower extremity edema.   Respiratory:  Crackles heard at bases GI: minimally-distended  MS:  Minimal lower extremity edema, dry skin; No deformity.  Neuro:  Nonfocal  Psych: Normal affect   Labs    High Sensitivity Troponin:   Recent Labs  Lab 07/03/19 1739 07/03/19 1930  TROPONINIHS 149* 163*      Chemistry Recent Labs  Lab 07/03/19 1930 07/04/19 0822 07/04/19 1424 07/05/19 0245 07/06/19 0233 07/06/19 1031 07/06/19 1037 07/07/19 0534  NA  --  133* 130* 131* 131* 131* 131*  134* 131*  K  --  3.7 3.3* 4.1 3.8 3.7 3.6  2.9* 3.6  CL  --  95* 90* 92* 92*  --   --  94*  CO2  --  10* 25 24 24   --   --  25  GLUCOSE  --  98 147* 107* 102*  --   --  105*  BUN  --  36* 36* 38* 46*  --   --  55*  CREATININE  --  1.50* 1.50* 1.72* 2.03*  --   --  2.15*  CALCIUM  --  9.0 9.1 8.7* 8.9  --   --  8.8*  PROT 6.4* 5.7* 6.2*  --   --   --   --   --   ALBUMIN 3.1* 2.9* 2.9*  --   --   --   --   --   AST  41 40  --   --   --   --   --   --   ALT 18 18  --   --   --   --   --   --   ALKPHOS 51 46  --   --   --   --   --   --   BILITOT 1.1 0.7  --   --   --   --   --   --   GFRNONAA  --  35* 35* 30* 24*  --   --  23*  GFRAA  --  40* 40* 34* 28*  --   --  26*  ANIONGAP  --  28* 15 15 15   --   --  12     Hematology Recent Labs  Lab 07/04/19 0409 07/05/19 0245 07/06/19 0609 07/06/19 1031 07/06/19 1037  WBC 7.8 9.1 10.8*  --   --   RBC 2.91*  2.91* 2.97* 3.01*  --   --   HGB 8.1* 8.1* 8.5* 8.2* 8.2*  7.5*  HCT 25.9* 25.6* 26.4* 24.0* 24.0*  22.0*  MCV 89.0 86.2 87.7  --   --   MCH 27.8 27.3 28.2  --   --   MCHC 31.3 31.6 32.2  --   --   RDW 16.1* 16.0* 16.5*  --   --   PLT 204 191 211  --   --     BNP Recent Labs  Lab 07/03/19 1930  BNP 1,124.1*     DDimer  Recent Labs  Lab 07/03/19 2052  DDIMER 3.82*     Radiology    NM CARDIAC AMYLOID TUMOR LOC INFLAM SPECT 1 DAY  Result Date: 07/05/2019 CLINICAL DATA:  HEART FAILURE. CONCERN FOR CARDIAC AMYLOIDOSIS.68 y.o. female with anasarca thought to be liver related with elevated pulmonary pressures normal EF chronic pericardial effusion pleural effusion negative  PE increased dyspnea on exertion unremarkable thyroid function negative proteinuria EXAM: NUCLEAR MEDICINE TUMOR LOCALIZATION. PYP CARDIAC AMYLOIDOSIS SCAN WITH SPECT TECHNIQUE: Following intravenous administration of radiopharmaceutical, anterior planar images of the chest were obtained. Regions of interest were placed on the heart and contralateral chest wall for quantitative assessment. Additional SPECT imaging of the chest was obtained. RADIOPHARMACEUTICALS:  20.7 mCi TECHNETIUM 99 PYROPHOSPHATE FINDINGS: Planar Visual assessment: Anterior planar imaging demonstrates radiotracer uptake within the heart less than uptake within the adjacent ribs (Grade 1). Quantitative assessment : Quantitative assessment of the cardiac uptake compared to the contralateral chest wall is equal to 1.16 (H/CL = 1.1). SPECT assessment: SPECT imaging of the chest demonstrates trace radiotracer accumulation within the LEFT ventricle. IMPRESSION: Visual and quantitative assessment (grade 1, H/CLL equal 1.16) are NOT suggestive of transthyretin amyloidosis. Electronically Signed   By: Suzy Bouchard M.D.   On: 07/05/2019 17:45   CARDIAC CATHETERIZATION  Result Date: 07/06/2019 Baseline Findings: RA = 7 RV = 42/7 PA = 48/17 (28) PCW = 16 LV = 122/8 Fick cardiac output/index = 5.0/3.2 PVR = 2.4 WU FA sat = 98% PA sat = 62%, 62% Prominent y-descents in RA tracing but no square root sign in ventricular tracings and no RV/LV interdeprendence After 350 cc NS bolus RA = 9 RV = 46/9 PA = 48/24 (33) PCW = 23 LV = 125/15 Prominent y-descents in RA tracing but no square root sign in ventricular tracings and no RV/LV interdeprendence Assessment: 1. Moderate pericardial effusion 2. Mild PAH 3. 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  D/w Dr. Marlou Porch.  If effusion becomes hemodynamically significant in future can consider window. Glori Bickers, MD 11:55 AM   DG Chest Port 1 View  Result Date: 07/06/2019 CLINICAL DATA:  Shortness of breath with pleural effusion EXAM: PORTABLE CHEST 1 VIEW COMPARISON:  Yesterday FINDINGS: Improving pulmonary opacification although there is still interstitial and airspace opacity. Cardiopericardial enlargement with pericardial effusion by recent CT. There is also pleural effusions. No pneumothorax IMPRESSION: 1. Improving interstitial and airspace opacity, rapid changes likely related to improving edema. 2. Stable cardiopericardial enlargement. Left more than right pleural effusions. Electronically Signed   By: Monte Fantasia M.D.   On: 07/06/2019 07:50    Cardiac Studies   Echocardiogram 07/03/2019: Moderate to large pericardial effusion circumferential with periodic diastolic invagination of the right atrial free wall, IVC does collapse, normal EF, elevated pulmonary pressures  Upper and lower extremity ABIs were normal secondary to blue fingers and toes  CT of abdomen pelvis on 04/05/2019 was unremarkable  Cardiac catheterization 07/06/2019:  Baseline  Findings:  RA = 7  RV = 42/7 PA = 48/17 (28) PCW = 16 LV = 122/8 Fick cardiac output/index = 5.0/3.2 PVR = 2.4 WU FA sat = 98% PA sat = 62%, 62%  Prominent y-descents in RA tracing but no square root sign in ventricular tracings and no RV/LV interdeprendence  After 350 cc NS bolus  RA = 9 RV = 46/9 PA = 48/24 (33) PCW = 23 LV = 125/15  Prominent y-descents in RA tracing but no square root sign in ventricular tracings and no RV/LV interdeprendence  Assessment: 1. Moderate pericardial effusion 2. Mild PAH 3. 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  D/w Dr. Marlou Porch. If effusion becomes hemodynamically significant in future can consider window.   Glori Bickers, MD   Patient Profile     68 y.o. female with anasarca thought to be liver related with elevated pulmonary pressures normal EF chronic pericardial effusion pleural effusion negative PE increased dyspnea on exertion unremarkable thyroid function negative proteinuria  Assessment & Plan    Anasarca/ dyspnea/pleural effusions bilaterally/moderate to large circumferential pericardial effusion/hypertension/Covid negative -In review of prior notes, presumably secondary to liver disease diagnosed at [facility] but this has been a rather questionable diagnosis, although she has steatosis. -No PE -Pleural effusions present-bilateral thoracentesis performed on 07/04/2019 700 cc bilaterally. -Personally reviewed her echocardiogram, I do not see any evidence of RV collapse, IVC does collapse.  Minimal respiratory variation in TV/MV continuous-wave Doppler.  She does have periodic invagination of the right atrial free wall. -Both right and left heart cath overall reassuring with no evidence of constriction no evidence of tamponade.  No shunting.  Cardiac output 5 L/min, original left ventricular end-diastolic pressure 8 mmHg, mean pulmonary artery pressure 28 -Continuing to hold her Lasix and spironolactone after creatinine increase and findings on cardiac catheterization. -Extremely complex prior medical history with multiple outpatient consultants. -Thyroid functions were overall normal, no evidence of myxedema. -Inflammatory markers are elevated, CRP for instance -Albumin is reduced 3 -She is also requesting her gabapentin for itchy lower extremities.  Continue with sertraline. -PYP scan was negative for transthyretin amyloidosis   Protein calorie malnutrition -BMI is currently  18.  Support protein load. Wasting.   Anemia of chronic disease -Chronically low  hemoglobin.  We will repeat CBC  Acute kidney injury -Creatinine this morning 2.1 slightly elevated from yesterday.  Not ready for cardiac MRI.  If stable tomorrow, could consider potential discharge or perhaps keep her here over the weekend to perform cardiac MRI if her renal function allows.   Discussed at length with Dr. Audie Box as well as family  For questions or updates, please contact [facility] Please consult [email or web address] for contact info under        Signed, Candee Furbish, MD  07/07/2019, 11:18 AM

## 2019-07-07 NOTE — Progress Notes (Signed)
NAME:  Joanna Ali, MRN:  [id number], DOB:  1951-01-03, LOS: 3 ADMISSION DATE:  07/03/2019, CONSULTATION DATE:  07/04/2019 REFERRING MD:  Dr. Meda Coffee, CHIEF COMPLAINT:  Tachycardia, hypertension, tachypnea  Brief History   68 year old female with several month history of anasarca, known pericardial effusion, pleural effusions, and intermittent dyspnea presenting with worsening dyspnea and fatigue.  Admitted by cardiology, found to be hypervolemic with new AKI but with ongoing tachycardia, tachypnea, respiratory distress/ platypnea- orthodeoxia with essentially unchanged pericardial effusion, PCCM called for evaluation.   Past Medical History  Chronic pericardial effusion, moderate AVR, HTN, osteoarthritis, macular degeneration, anasarca, hepatic steatosis, peripheral neuropathy  Significant Hospital Events   12/14 Admitted  Consults:  Cards PCCM  Procedures:  12/15 Foley   Significant Diagnostic Tests:  12/14 CTA PE >> No evidence of pulmonary embolus. Multifocal areas of interstitial and airspace opacity with septal thickening bilateral pleural effusions, and pericardial effusion is worrisome for an acute infection process with superimposed edema/heart failure. Scattered subcentimeter low-attenuation mediastinal and hilar adenopathy, likely reactive. 12/14 TTE  >> Mild global left ventricular hypokinesis with LV EF approximately 50%. Grade II diastolic dysfunction (pseudonormalization). Elevated left atrial pressure. Moderate circumferential pericardial effusion. There is inversion of the right atrial wall. No other signs of tamponade are identified. Tricuspid valve regurgitation is moderate. Severely elevated pulmonary artery systolic pressure. Moderate mitral valve regurgitation. 12/16 PYP scan > not suggestive of amyloid 12/17 R/LHC > mild PAH, normal filling pressures. No significant coronary disease.    Micro Data:  12/14 BC x 2 >> 12/14 RVP >> neg 12/14 SARS2 >> neg 12/15  SARS2 >> neg 12/15 MRSA PCR >> neg  Antimicrobials:  n/a  Interim history/subjective:  Somewhat more short of breath right now, but did just transfer form chair. No other complaints.   Objective   Blood pressure (!) 157/106, pulse (!) 105, temperature 97.6 F (36.4 C), temperature source Oral, resp. rate (!) 32, height 5' 6"  (1.676 m), weight 50.7 kg, SpO2 100 %.        Intake/Output Summary (Last 24 hours) at 07/07/2019 0956 Last data filed at 07/07/2019 0800 Gross per 24 hour  Intake 820 ml  Output 1685 ml  Net -865 ml   Filed Weights   07/05/19 0500 07/06/19 0500 07/07/19 0148  Weight: 50.3 kg 50 kg 50.7 kg    Examination:  General:  Frail female in NAD Neuro:  Alert, oriented, non-focal HEENT:  [place]/AT, PERRL, no JVD Cardiovascular:  RRR, no MRG Lungs: Crackles bilateral Abdomen:  Soft, non-tender, non-distended Musculoskeletal:  No acute deformity or ROM limitation.  Skin:  Intact, MMM   Resolved Hospital Problem list    Assessment & Plan:  68 year old female w/ massive anasarca and protein calorie malnutrition. Has had extensive rheum, onc and GI eval. Still no unifying dx as to etiology. PCCM asked to see for worsening dyspnea and hypoxia from volume overload and bilateral effusions  Acute hypoxic respiratory failure - secondary to global volume overload (pericardial and bilateral pleural effusions) & severe protein cal malnutrition. S/p bilateral thoracentesis (transudate), cytology negative. Has somewhat improved with diuresis, but still dyspneic and requiring oxygen.  Plan - Diuresis per cardiology - No indication for repeat thoracentesis, or PleurX - Incentive spirometer.  - Wean oxygen as tolerated    Pericardial effusion- chronic, moderate to large and circumferential without tamponade physiology on TTE 12/14 PM - etiology unclear, attributed to hypoalbuminemia thus far, mostly negative outside rheumatology workup in September except noted for  M spike,  elevated kappa free light chain Nuc Med PYP not suggestive of amyloid.  Plan Cont supportive care Supportive care For [facility] today.   PH, FTT, severe protein calorie malnutrition,  - ongoing workup - Management per primary  Left PTX s/p thora. Suspect this reflects incomplete re-expansion. Resolved   AKI w/ severe total body overload Plan Will likely need to hold diuresis today as creatinine has bumped after LHC yesterday  Anemia Anemia panel wnl except ferritin level [place] will sign off. Please re-consult if needed.    Best practice:  Diet: per primary Pain/Anxiety/Delirium protocol (if indicated): n/a VAP protocol (if indicated): n/a DVT prophylaxis: SCDs GI prophylaxis: n/a Glucose control: trend on BMP Mobility: BR    Code Status: full  Family Communication: patient updated Disposition: ICU     Georgann Housekeeper, AGACNP-BC [place] for personal pager PCCM on call pager [phone]  07/07/2019 9:56 AM

## 2019-07-08 DIAGNOSIS — E43 Unspecified severe protein-calorie malnutrition: Secondary | ICD-10-CM

## 2019-07-08 DIAGNOSIS — N179 Acute kidney failure, unspecified: Secondary | ICD-10-CM

## 2019-07-08 DIAGNOSIS — D649 Anemia, unspecified: Secondary | ICD-10-CM

## 2019-07-08 LAB — BASIC METABOLIC PANEL
Anion gap: 14 (ref 5–15)
BUN: 60 mg/dL — ABNORMAL HIGH (ref 8–23)
CO2: 24 mmol/L (ref 22–32)
Calcium: 8.9 mg/dL (ref 8.9–10.3)
Chloride: 92 mmol/L — ABNORMAL LOW (ref 98–111)
Creatinine, Ser: 2.13 mg/dL — ABNORMAL HIGH (ref 0.44–1.00)
GFR calc Af Amer: 26 mL/min — ABNORMAL LOW (ref >60)
GFR calc non Af Amer: 23 mL/min — ABNORMAL LOW (ref >60)
Glucose, Bld: 89 mg/dL (ref 70–99)
Potassium: 3.5 mmol/L (ref 3.5–5.1)
Sodium: 130 mmol/L — ABNORMAL LOW (ref 135–145)

## 2019-07-08 LAB — CULTURE, BLOOD (ROUTINE X 2)
Culture: NO GROWTH
Culture: NO GROWTH
Special Requests: ADEQUATE
Special Requests: ADEQUATE

## 2019-07-08 LAB — CBC
HCT: 23.3 % — ABNORMAL LOW (ref 36.0–46.0)
Hemoglobin: 7.6 g/dL — ABNORMAL LOW (ref 12.0–15.0)
MCH: 28.7 pg (ref 26.0–34.0)
MCHC: 32.6 g/dL (ref 30.0–36.0)
MCV: 87.9 fL (ref 80.0–100.0)
Platelets: 220 10*3/uL (ref 150–400)
RBC: 2.65 MIL/uL — ABNORMAL LOW (ref 3.87–5.11)
RDW: 16.8 % — ABNORMAL HIGH (ref 11.5–15.5)
WBC: 9 10*3/uL (ref 4.0–10.5)
nRBC: 0.2 % (ref 0.0–0.2)

## 2019-07-08 MED ORDER — CARVEDILOL 3.125 MG PO TABS: 3.1250 mg | ORAL_TABLET | Freq: Two times a day (BID) | ORAL | Status: DC

## 2019-07-08 MED ORDER — CARVEDILOL 3.125 MG PO TABS
3.1250 mg | ORAL_TABLET | Freq: Two times a day (BID) | ORAL | Status: DC
  Administered 2019-07-08 – 2019-07-12 (×8): 3.125 mg via ORAL
  Filled 2019-07-08 (×8): qty 1

## 2019-07-08 NOTE — Progress Notes (Signed)
Progress Note  [name] Date of Encounter: 07/08/2019  Primary Cardiologist: Evalina Field, MD   Subjective   No chest pain  Inpatient Medications    Scheduled Meds: . Chlorhexidine Gluconate Cloth  6 each Topical Daily  . enoxaparin (LOVENOX) injection  30 mg Subcutaneous Q24H  . feeding supplement (ENSURE ENLIVE)  237 mL Oral TID BM  . gabapentin  300 mg Oral TID  . mouth rinse  15 mL Mouth Rinse BID  . sertraline  25 mg Oral BID  . sodium chloride flush  3 mL Intravenous Q12H  . sodium chloride flush  3 mL Intravenous Q12H   Continuous Infusions: . sodium chloride 500 mL (07/06/19 1043)  . sodium chloride     PRN Meds:    sodium chloride, sodium chloride, acetaminophen, camphor-menthol, ondansetron (ZOFRAN) IV, sodium chloride flush, sodium chloride flush, traMADol   Vital Signs    Vitals:   07/07/19 1403 07/07/19 2201 07/08/19 0044 07/08/19 0524  BP: (!) 159/102 (!) 153/107 (!) 137/92 (!) 147/96  Pulse: (!) 105 (!) 105  (!) 106  Resp: (!) 24 (!) _0 Temp: 97.6 F (36.4 C) 97.6 F (36.4 C)  (!) 97.5 F (36.4 C)  TempSrc: Oral Oral  Oral  SpO2: 95% 95%  96%  Weight:    51.4 kg  Height:        Intake/Output Summary (Last 24 hours) at 07/08/2019 0913 Last data filed at 07/07/2019 1330 Gross per 24 hour  Intake --  Output 275 ml  Net -275 ml    I/O since admission: -2295  Filed Weights   07/06/19 0500 07/07/19 0148 07/08/19 0524  Weight: 50 kg 50.7 kg 51.4 kg    Telemetry    Sinus tachycardia 101 with occasional PACs- Personally Reviewed  ECG    07/04/2019 ECG (independently read by me): Sinus tachycardia at 129, poor R progression.  Low voltage.  Physical Exam   BP (!) 147/96 (BP Location: Left Arm)   Pulse (!) 106   Temp (!) 97.5 F (36.4 C) (Oral)   Resp 16   Ht _1  (1.676 m)   Wt 51.4 kg   SpO2 96%   BMI 18.29 kg/m  General: Alert, oriented, no distress.  Skin: normal turgor, no rashes, warm and  dry HEENT: Normocephalic, atraumatic. Pupils equal round and reactive to light; sclera anicteric; extraocular muscles intact; Nose without nasal septal hypertrophy Mouth/Parynx benign;  Neck: No JVD, no carotid bruits; normal carotid upstroke Lungs: clear to ausculatation and percussion; no wheezing or rales Chest wall: without tenderness to palpitation Heart: PMI not displaced, RRR, s1 s2 normal, 1/6 systolic murmur, no diastolic murmur, no rubs, gallops, thrills, or heaves Abdomen: soft, nontender; no hepatosplenomehaly, BS+; abdominal aorta nontender and not dilated by palpation. Back: no CVA tenderness Pulses 2+ Musculoskeletal: full range of motion, normal strength, no joint deformities Extremities: no clubbing cyanosis or edema, Homan's sign negative  Neurologic: grossly nonfocal; Cranial nerves grossly wnl Psychologic: Normal mood and affect     Labs    Chemistry Recent Labs  Lab 07/03/19 1930 07/04/19 0822 07/04/19 1424 07/06/19 0233 07/06/19 1037 07/07/19 0534 07/08/19 0456  NA  --  133* 130* 131* 131*  134* 131* 130*  K  --  3.7 3.3* 3.8 3.6  2.9* 3.6 3.5  CL  --  95* 90* 92*  --  94* 92*  CO2  --  10* 25 24  --  25 24  GLUCOSE  --  98 147* 102*  --  105* 89  BUN  --  36* 36* 46*  --  55* 60*  CREATININE  --  1.50* 1.50* 2.03*  --  2.15* 2.13*  CALCIUM  --  9.0 9.1 8.9  --  8.8* 8.9  PROT 6.4* 5.7* 6.2*  --   --   --   --   ALBUMIN 3.1* 2.9* 2.9*  --   --   --   --   AST 41 40  --   --   --   --   --   ALT 18 18  --   --   --   --   --   ALKPHOS 51 46  --   --   --   --   --   BILITOT 1.1 0.7  --   --   --   --   --   GFRNONAA  --  35* 35* 24*  --  23* 23*  GFRAA  --  40* 40* 28*  --  26* 26*  ANIONGAP  --  28* 15 15  --  12 14     Hematology Recent Labs  Lab 07/06/19 0609 07/06/19 1037 07/07/19 1119 07/08/19 0456  WBC 10.8*  --  9.6 9.0  RBC 3.01*  --  2.65* 2.65*  HGB 8.5* 8.2*  7.5* 7.5* 7.6*  HCT 26.4* 24.0*  22.0* 24.0* 23.3*  MCV 87.7   --  90.6 87.9  MCH 28.2  --  28.3 28.7  MCHC 32.2  --  31.3 32.6  RDW 16.5*  --  17.0* 16.8*  PLT 211  --  197 220    Cardiac EnzymesNo results for input(s): TROPONINI in the last 168 hours. No results for input(s): TROPIPOC in the last 168 hours.   BNP Recent Labs  Lab 07/03/19 1930  BNP 1,124.1*     DDimer  Recent Labs  Lab 07/03/19 2052  DDIMER 3.82*     Lipid Panel     Component Value Date/Time   CHOL 74 04/06/2019 0841   TRIG 121 07/03/2019 2052   HDL 27 (L) 04/06/2019 0841   CHOLHDL 2.7 04/06/2019 0841   VLDL 14 04/06/2019 0841   LDLCALC 33 04/06/2019 0841     Radiology    CARDIAC CATHETERIZATION  Result Date: 07/06/2019 Baseline Findings: RA = 7 RV = 42/7 PA = 48/17 (28) PCW = 16 LV = 122/8 Fick cardiac output/index = 5.0/3.2 PVR = 2.4 WU FA sat = 98% PA sat = 62%, 62% Prominent y-descents in RA tracing but no square root sign in ventricular tracings and no RV/LV interdeprendence After 350 cc NS bolus RA = 9 RV = 46/9 PA = 48/24 (33) PCW = 23 LV = 125/15 Prominent y-descents in RA tracing but no square root sign in ventricular tracings and no RV/LV interdeprendence Assessment: 1. Moderate pericardial effusion 2. Mild PAH 3. 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  D/w Dr. Marlou Porch. If effusion becomes hemodynamically significant in future can consider window. Glori Bickers, MD 11:55 AM    Cardiac Studies   Echocardiogram 07/03/2019: Moderate to large pericardial effusion circumferential with periodic diastolic invagination of the right atrial free wall, IVC does collapse, normal EF, elevated pulmonary pressures  Upper and lower extremity ABIs were normal secondary to blue fingers and toes  CT of abdomen pelvis on 04/05/2019 was unremarkable  Cardiac  catheterization 07/06/2019:  Baseline  Findings:  RA = 7  RV = 42/7 PA = 48/17 (28) PCW = 16 LV = 122/8 Fick cardiac output/index = 5.0/3.2 PVR = 2.4 WU FA sat = 98% PA sat = 62%, 62%  Prominent y-descents in RA tracing but no square root sign in ventricular tracings and no RV/LV interdeprendence  After 350 cc NS bolus  RA = 9 RV = 46/9 PA = 48/24 (33) PCW = 23 LV = 125/15  Prominent y-descents in RA tracing but no square root sign in ventricular tracings and no RV/LV interdeprendence  Assessment: 1. Moderate pericardial effusion 2. Mild PAH 3. 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 D/w Dr. Marlou Porch. If effusion becomes hemodynamically significant in future can consider window.   Glori Bickers, MD  Patient Profile     68 y.o. female with anasarca thought to be liver related with elevated pulmonary pressures normal EF chronic pericardial effusion pleural effusion negative PE increased dyspnea on exertion unremarkable thyroid function negative proteinuria  Assessment & Plan    1. Anasarca/ dyspnea/pleural effusions bilaterally/moderate to large circumferential pericardial effusion/hypertension/Covid negative: Right and left heart cath data reviewed, no evidence for tamponade or constrictive pericarditis.  PYP scan negative for transthyretin amyloidosis.  Elevated inflammatory markers first.  Normal thyroid function without evidence for myxedema.  Cardiac output was 5 L/min at cath. 2.  Sinus tachycardia: BP elevated.  Will initiate low-dose carvedilol 3.125 mg twice a day.  3.  Acute kidney injury: Creatinine remains elevated at 2.13.  Not ready for cardiac MRI.  Lasix and spironolactone have been on hold.  BUN 60, creatinine 2.13.  4.  Anemia:  Hemoglobin/hematocrit 7.6/23.3 today.  May need transfusion if further reduced.   5.  Protein calorie malnutrition.  BMI 18.  We will recheck c-Met in a.m.  Increase protein.   Time spent: 40 minutes Signed, Troy Sine, MD, [facility] 07/08/2019, 9:13 AM

## 2019-07-08 NOTE — Plan of Care (Signed)

## 2019-07-09 ENCOUNTER — Inpatient Hospital Stay (HOSPITAL_COMMUNITY): Payer: Medicare Other

## 2019-07-09 DIAGNOSIS — I313 Pericardial effusion (noninflammatory): Secondary | ICD-10-CM

## 2019-07-09 LAB — CBC
HCT: 24.2 % — ABNORMAL LOW (ref 36.0–46.0)
Hemoglobin: 7.6 g/dL — ABNORMAL LOW (ref 12.0–15.0)
MCH: 28.3 pg (ref 26.0–34.0)
MCHC: 31.4 g/dL (ref 30.0–36.0)
MCV: 90 fL (ref 80.0–100.0)
Platelets: 251 10*3/uL (ref 150–400)
RBC: 2.69 MIL/uL — ABNORMAL LOW (ref 3.87–5.11)
RDW: 18 % — ABNORMAL HIGH (ref 11.5–15.5)
WBC: 10.2 10*3/uL (ref 4.0–10.5)
nRBC: 0.4 % — ABNORMAL HIGH (ref 0.0–0.2)

## 2019-07-09 LAB — COMPREHENSIVE METABOLIC PANEL
ALT: 16 U/L (ref 0–44)
AST: 37 U/L (ref 15–41)
Albumin: 2.6 g/dL — ABNORMAL LOW (ref 3.5–5.0)
Alkaline Phosphatase: 57 U/L (ref 38–126)
Anion gap: 14 (ref 5–15)
BUN: 66 mg/dL — ABNORMAL HIGH (ref 8–23)
CO2: 25 mmol/L (ref 22–32)
Calcium: 9.1 mg/dL (ref 8.9–10.3)
Chloride: 89 mmol/L — ABNORMAL LOW (ref 98–111)
Creatinine, Ser: 2.35 mg/dL — ABNORMAL HIGH (ref 0.44–1.00)
GFR calc Af Amer: 24 mL/min — ABNORMAL LOW (ref >60)
GFR calc non Af Amer: 20 mL/min — ABNORMAL LOW (ref >60)
Glucose, Bld: 101 mg/dL — ABNORMAL HIGH (ref 70–99)
Potassium: 4 mmol/L (ref 3.5–5.1)
Sodium: 128 mmol/L — ABNORMAL LOW (ref 135–145)
Total Bilirubin: 0.9 mg/dL (ref 0.3–1.2)
Total Protein: 5.5 g/dL — ABNORMAL LOW (ref 6.5–8.1)

## 2019-07-09 LAB — URINALYSIS, ROUTINE W REFLEX MICROSCOPIC
Bacteria, UA: NONE SEEN
Bilirubin Urine: NEGATIVE
Glucose, UA: NEGATIVE mg/dL
Hgb urine dipstick: NEGATIVE
Ketones, ur: NEGATIVE mg/dL
Nitrite: NEGATIVE
Protein, ur: 30 mg/dL — AB
Specific Gravity, Urine: 1.012 (ref 1.005–1.030)
pH: 5 (ref 5.0–8.0)

## 2019-07-09 LAB — ECHOCARDIOGRAM LIMITED
Height: 66 in
Weight: 1897.72 oz

## 2019-07-09 LAB — NA AND K (SODIUM & POTASSIUM), RAND UR
Potassium Urine: 40 mmol/L
Sodium, Ur: <10 mmol/L

## 2019-07-09 LAB — BRAIN NATRIURETIC PEPTIDE: B Natriuretic Peptide: 4004.9 pg/mL — ABNORMAL HIGH (ref 0.0–100.0)

## 2019-07-09 LAB — OSMOLALITY, URINE: Osmolality, Ur: 385 mOsm/kg (ref 300–900)

## 2019-07-09 NOTE — Progress Notes (Signed)
Echocardiogram 2D Echocardiogram (limited) has been performed.  Oneal Deputy Hilbert Briggs 07/09/2019, 2:56 PM

## 2019-07-09 NOTE — Progress Notes (Addendum)
Progress Note  [name] Date of Encounter: 07/09/2019  Primary Cardiologist: Evalina Field, MD   Subjective   Pt continues to be fatigued.  Inpatient Medications    Scheduled Meds: . carvedilol  3.125 mg Oral BID WC  . Chlorhexidine Gluconate Cloth  6 each Topical Daily  . enoxaparin (LOVENOX) injection  30 mg Subcutaneous Q24H  . feeding supplement (ENSURE ENLIVE)  237 mL Oral TID BM  . gabapentin  300 mg Oral TID  . mouth rinse  15 mL Mouth Rinse BID  . sertraline  25 mg Oral BID  . sodium chloride flush  3 mL Intravenous Q12H  . sodium chloride flush  3 mL Intravenous Q12H   Continuous Infusions: . sodium chloride 500 mL (07/06/19 1043)  . sodium chloride     PRN Meds: sodium chloride, sodium chloride, acetaminophen, camphor-menthol, ondansetron (ZOFRAN) IV, sodium chloride flush, sodium chloride flush, traMADol   Vital Signs    Vitals:   07/09/19 0507 07/09/19 0740 07/09/19 0753 07/09/19 0826  BP: 113/81 (!) 140/93 (!) 138/92 (!) 146/95  Pulse: 100 93  96  Resp: (!) 24 20 19    Temp: (!) 97.4 F (36.3 C) 98.3 F (36.8 C)    TempSrc: Oral Oral    SpO2: 92% 92%    Weight: 53.8 kg     Height:        Intake/Output Summary (Last 24 hours) at 07/09/2019 0943 Last data filed at 07/09/2019 7262 Gross per 24 hour  Intake 225 ml  Output 250 ml  Net -25 ml   Last 3 Weights 07/09/2019 07/08/2019 07/07/2019  Weight (lbs) 118 lb 9.7 oz 113 lb 5.1 oz 111 lb 12.4 oz  Weight (kg) 53.8 kg 51.4 kg 50.7 kg      Telemetry    Sinus rhythm 80-90s - Personally Reviewed  ECG    No new tracings - Personally Reviewed  Physical Exam   GEN: frail appearing elderly female  Neck: No JVD Cardiac: RRR, no murmurs, rubs, or gallops.  Respiratory: respirations diminished in bases GI: Soft, nontender, non-distended  MS: trace edema; No deformity. Neuro:  Nonfocal  Psych: Normal affect   Labs    High Sensitivity Troponin:   Recent Labs  Lab  07/03/19 1739 07/03/19 1930  TROPONINIHS 149* 163*      Chemistry Recent Labs  Lab 07/03/19 1930 07/04/19 0822 07/04/19 1424 07/07/19 0534 07/08/19 0456 07/09/19 0347  NA  --  133* 130* 131* 130* 128*  K  --  3.7 3.3* 3.6 3.5 4.0  CL  --  95* 90* 94* 92* 89*  CO2  --  10* 25 25 24 25   GLUCOSE  --  98 147* 105* 89 101*  BUN  --  36* 36* 55* 60* 66*  CREATININE  --  1.50* 1.50* 2.15* 2.13* 2.35*  CALCIUM  --  9.0 9.1 8.8* 8.9 9.1  PROT 6.4* 5.7* 6.2*  --   --  5.5*  ALBUMIN 3.1* 2.9* 2.9*  --   --  2.6*  AST 41 40  --   --   --  37  ALT 18 18  --   --   --  16  ALKPHOS 51 46  --   --   --  57  BILITOT 1.1 0.7  --   --   --  0.9  GFRNONAA  --  35* 35* 23* 23* 20*  GFRAA  --  40* 40* 26* 26* 24*  ANIONGAP  --  28* 15 12 14 14      Hematology Recent Labs  Lab 07/07/19 1119 07/08/19 0456 07/09/19 0347  WBC 9.6 9.0 10.2  RBC 2.65* 2.65* 2.69*  HGB 7.5* 7.6* 7.6*  HCT 24.0* 23.3* 24.2*  MCV 90.6 87.9 90.0  MCH 28.3 28.7 28.3  MCHC 31.3 32.6 31.4  RDW 17.0* 16.8* 18.0*  PLT 197 220 251    BNP Recent Labs  Lab 07/03/19 1930 07/09/19 0347  BNP 1,124.1* 4,004.9*     DDimer  Recent Labs  Lab 07/03/19 2052  DDIMER 3.82*     Radiology    No results found.  Cardiac Studies   Right and left heart cath 07/06/19:  Findings:  RA = 7  RV = 42/7 PA = 48/17 (28) PCW = 16 LV = 122/8 Fick cardiac output/index = 5.0/3.2 PVR = 2.4 WU FA sat = 98% PA sat = 62%, 62%  Prominent y-descents in RA tracing but no square root sign in ventricular tracings and no RV/LV interdeprendence  After 350 cc NS bolus  RA = 9 RV = 46/9 PA = 48/24 (33) PCW = 23 LV = 125/15  Prominent y-descents in RA tracing but no square root sign in ventricular tracings and no RV/LV interdeprendence  Assessment: 1. Moderate pericardial effusion 2. Mild PAH 3. 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  D/w Dr. Marlou Porch. If effusion becomes hemodynamically significant in future can consider window.   Patient Profile     68 y.o. female with anasarca thought to be liver related with elevated pulmonary pressures normal EF chronic pericardial effusion pleural effusion negative PE increased dyspnea on exertion unremarkable thyroid function negative proteinuria.  Assessment & Plan    1. Anasarca 2. Dyspnea 3. Bilateral pleural effusions 4. Moderate to large circumferential pericardial effusion  5. Hypertension - anasarca presumably due to liver disease diagnosed at [facility], but noted to be a questionable diagnosis - does have steatosis - symptoms seem out of proportion to her previous alcohol use - COVID negative - CTA negative for PE, but bilateral pleural effusions s/p bilateral thoracenteses - right and left heart cath negative for tamponade or constrictive pericarditis; cardiac output was 5L/min - PYP scan negative for transthyretin amyloidosis - carvedilol was started at 3.125 mg BID yesterday - pressure and HR much better controlled, continue BB - will check a portable CXR today - will check limited echo tomorrow to make sure effusion is not progressing given her tachycardia   6. Sinus tachycardia - coreg started yesterday - telemetry with HR in the 90-100s, but 80s on exam - will continue BB at present dose - recheck CXR and limited echo as above   7. AKI - sCr 2.35 (2.13) - lasix and spiro on hold, creatine continues to rise - may need nephrology consult   8. Anemia of chronic disease - Hb 7.6 - appears to be her baseline   9. Albuminemia  - albumin 2.6 - she is drinking protein supplements with meals, eating OK    For questions or updates, please contact [place] Please consult [email or web address] for contact info under         Signed, Ledora Bottcher, PA  07/09/2019, 9:43 AM    Patient seen and examined. Agree with assessment and plan.  Heart rate is improved from yesterday  with the addition of low-dose carvedilol 3.125 mg twice a day, now with rate in the 80s to low 90s.  BMP this morning is significantly increased at 4004, up from 1124 6 days ago.  BUN/creatinine further increased at 66/2.35, not on diuretic.  LFTs normal.  Albumin 2.6.  Decreased breath sounds at bases can consistent with probable pleural effusion.  We will recheck chest x-ray.  Also recommend recheck echo Doppler study to reassess LV function and effusion.  Hyponatremia with sodium at 128 most likely related to volume overload.  I discussed at length with the patient.  Will obtain nephrology consult.  Discussed at length with patient and husband.   Troy Sine, MD, [facility] 07/09/2019 10:58 AM

## 2019-07-09 NOTE — Plan of Care (Signed)

## 2019-07-09 NOTE — Consult Note (Addendum)
[facility] Nephrology Consultation Note  Requesting MD: Dr. Claiborne Billings, Marcello Moores Reason for consult: AKI on CKD  HPI:  Joanna Ali is a 68 y.o. female with history of hypertension, former smoker, hepatic steatosis, chronic pericardial effusion, moderate AVR initially admitted on 12/17 for progressive dyspnea, anasarca and fatigue, seen as a consultation at the request by Dr. Claiborne Billings for AKI and fluid overload.  Patient has dyspnea on exertion and fluid overload since September 2020.  She required admission for anasarca and IV diuretics previously.  Seen by rheumatologist with negative CRP, ESR, ANCA, ANA, free light chain.  The chronic bilateral pleural effusion and pericardial effusion has been attributed to hypoalbuminemia possibly related with her liver disease.  She was at her cardiology clinic where she was tachycardic, tachypneic and went into respiratory distress.  She was sent to the ER.  Chest x-ray with pleural effusion.  She had CT angio on 12/14 which was negative for PE however showing moderate pericardial effusion and airspace disease.  BNP was elevated.  The echocardiogram showed moderate to large pericardial effusion.  Until September 2020 her creatinine level was normal and then the cr  was around 1.5-1.7.  She had left and right heart cath on 12/17.  The serum creatinine level is started to climb up to 2.35 today.  The IV Lasix was held and we are consulted for further evaluation.  She was responding with IV diuretics however urine output declined after holding Lasix.  Also seen by pulmonologist and underwent thoracocentesis with removal of 700 cc fluid on 12/15.  Today patient reports that her lower extremity edema is much better.  She denied dysuria, urgency, nausea vomiting, dysgeusia.  Has weakness.  S OB much better.  No chest pain.  Her husband at the bedside.  PMHx:   Past Medical History:  Diagnosis Date  . Hypertension   . Leg swelling 03/2019  . Short of  breath on exertion 03/2019    Past Surgical History:  Procedure Laterality Date  . APPENDECTOMY    . NECK SURGERY    . RIGHT AND LEFT HEART CATH N/A 07/06/2019   Procedure: RIGHT AND LEFT HEART CATH;  Surgeon: Jolaine Artist, MD;  Location: [facility];  Service: Cardiovascular;  Laterality: N/A;  . TEE WITHOUT CARDIOVERSION N/A 03/28/2019   Procedure: TRANSESOPHAGEAL ECHOCARDIOGRAM (TEE);  Surgeon: Geralynn Rile, MD;  Location: [facility];  Service: Cardiology;  Laterality: N/A;    Family Hx:  Family History  Problem Relation Age of Onset  . Hypertension Mother     Social History:  reports that she quit smoking about 40 years ago. Her smoking use included cigarettes. [name]. She has never used smokeless tobacco. [name].  Allergies: No Known Allergies  Medications: Prior to Admission medications   Medication Sig Start Date End Date Taking? Authorizing Provider  gabapentin (NEURONTIN) 300 MG capsule Take 300 mg by mouth 3 (three) times daily.  05/24/19 07/04/19 Yes [provider]  Multiple Vitamins-Minerals (MULTIVITAMIN WITH MINERALS) tablet Take 1 tablet by mouth daily.   Yes [provider]  Multiple Vitamins-Minerals (PRESERVISION AREDS 2) CAPS Take 1 capsule by mouth 2 (two) times daily.    Yes [provider]  Propylene Glycol (SYSTANE BALANCE) 0.6 % SOLN Place 1 drop into both eyes 2 (two) times daily as needed (dry eyes).   Yes [provider]  senna (SENOKOT) 8.6 MG  TABS tablet Take 1-3 tablets by mouth See admin instructions. Taking 1 tablet in the am and 2 tabs at bedtime   Yes [provider]  sertraline (ZOLOFT) 25 MG tablet Take 25 mg by mouth 2 (two) times daily.  05/17/19 05/16/20 Yes [provider]  spironolactone (ALDACTONE) 100 MG tablet Take 100 mg by mouth daily.  04/19/19 05/22/20 Yes [provider]   torsemide (DEMADEX) 20 MG tablet Take 20 mg by mouth daily.  04/18/19 05/22/20 Yes [provider]  traMADol (ULTRAM) 50 MG tablet Take 50 mg by mouth every 6 (six) hours as needed for moderate pain.    Yes [provider]  TURMERIC PO Take 1 capsule by mouth daily.    Yes [provider]    I have reviewed the patient's current medications.  Labs:  Results for orders placed or performed during the hospital encounter of 07/03/19 (from the past 48 hour(s))  Basic metabolic panel     Status: Abnormal   Collection Time: 07/08/19  4:56 AM  Result Value Ref Range   Sodium 130 (L) 135 - 145 mmol/L   Potassium 3.5 3.5 - 5.1 mmol/L   Chloride 92 (L) 98 - 111 mmol/L   CO2 24 22 - 32 mmol/L   Glucose, Bld 89 70 - 99 mg/dL   BUN 60 (H) 8 - 23 mg/dL   Creatinine, Ser 2.13 (H) 0.44 - 1.00 mg/dL   Calcium 8.9 8.9 - 10.3 mg/dL   GFR calc non Af Amer 23 (L) >60 mL/min   GFR calc Af Amer 26 (L) >60 mL/min   Anion gap 14 5 - 15    Comment: Performed at [facility], [address], [place], [place] [address]  CBC daily     Status: Abnormal   Collection Time: 07/08/19  4:56 AM  Result Value Ref Range   WBC 9.0 4.0 - 10.5 K/uL   RBC 2.65 (L) 3.87 - 5.11 MIL/uL   Hemoglobin 7.6 (L) 12.0 - 15.0 g/dL   HCT 23.3 (L) 36.0 - 46.0 %   MCV 87.9 80.0 - 100.0 fL   MCH 28.7 26.0 - 34.0 pg   MCHC 32.6 30.0 - 36.0 g/dL   RDW 16.8 (H) 11.5 - 15.5 %   Platelets 220 150 - 400 K/uL   nRBC 0.2 0.0 - 0.2 %    Comment: Performed at [facility], [place] [address] [place], [place] [address]  CBC daily     Status: Abnormal   Collection Time: 07/09/19  3:47 AM  Result Value Ref Range   WBC 10.2 4.0 - 10.5 K/uL   RBC 2.69 (L) 3.87 - 5.11 MIL/uL   Hemoglobin 7.6 (L) 12.0 - 15.0 g/dL   HCT 24.2 (L) 36.0 - 46.0 %   MCV 90.0 80.0 - 100.0 fL   MCH 28.3 26.0 - 34.0 pg   MCHC 31.4 30.0 - 36.0 g/dL   RDW 18.0 (H) 11.5 - 15.5 %   Platelets 251 150 - 400 K/uL   nRBC 0.4 (H) 0.0 - 0.2  %    Comment: Performed at [facility], [place] [address], [place], [place] [address]  Brain natriuretic peptide     Status: Abnormal   Collection Time: 07/09/19  3:47 AM  Result Value Ref Range   B Natriuretic Peptide 4,004.9 (H) 0.0 - 100.0 pg/mL    Comment: Performed at [facility] [address] [place], [place] [address]  Comprehensive metabolic panel in am     Status: Abnormal   Collection Time: 07/09/19  3:47 AM  Result Value Ref Range   Sodium 128 (L) 135 - 145 mmol/L   Potassium 4.0 3.5 - 5.1 mmol/L   Chloride 89 (L) 98 - 111 mmol/L   CO2 25 22 - 32 mmol/L   Glucose, Bld 101 (H) 70 - 99 mg/dL   BUN 66 (H) 8 - 23 mg/dL   Creatinine, Ser 2.35 (H) 0.44 - 1.00 mg/dL   Calcium 9.1 8.9 - 10.3 mg/dL   Total Protein 5.5 (L) 6.5 - 8.1 g/dL   Albumin 2.6 (L) 3.5 - 5.0 g/dL   AST 37 15 - 41 U/L   ALT 16 0 - 44 U/L   Alkaline Phosphatase 57 38 - 126 U/L   Total Bilirubin 0.9 0.3 - 1.2 mg/dL   GFR calc non Af Amer 20 (L) >60 mL/min   GFR calc Af Amer 24 (L) >60 mL/min   Anion gap 14 5 - 15    Comment: Performed at [facility], [address] [place], [place] [address]     ROS:  Pertinent items noted in HPI and remainder of comprehensive ROS otherwise negative.  Physical Exam: Vitals:   07/09/19 0753 07/09/19 0826  BP: (!) 138/92 (!) 146/95  Pulse:  96  Resp: 19   Temp:    SpO2:       General exam: Lying on bed comfortable, not in distress Respiratory system: Bibasal crackles, no increased work of breathing  Cardiovascular system: S1 & S2 heard, RRR.  Bilateral lower extremity pitting edema Gastrointestinal system: Abdomen is nondistended, soft and nontender. Normal bowel sounds heard. Central nervous system: Alert and oriented. No focal neurological deficits. Extremities: Symmetric 5 x 5 power. Skin: No rashes, lesions or ulcers Psychiatry: Judgement and insight appear normal. Mood & affect appropriate.   Assessment/Plan:  #Acute kidney injury,  nonoliguric: Likely hemodynamically mediated concomitant with use of IV contrast.  She probably has poor renal perfusion in the setting of pericardial effusion. Urinalysis was negative for protein.  No RBC or WBC in 03/2019 Repeat UA.  Order bladder scan, kidney ultrasound to evaluate the echogenicity and as a baseline.  Less likely to have obstruction. She probably need further IV diuretics to maintain euvolemia.  Patient is hesitant to resume Lasix today because of renal failure.  Readdress in the morning pending lab result. Monitor BMP, strict ins and out.  Avoid further IV contrast. No uremia.  No indication for dialysis.  #Hyponatremia, hypervolemic: Check urine electrolytes and osmolality.  She will need further loop diuretics.  #Anasarca presumably due to liver disease which was diagnosed at [facility].  She has a history of liver steatosis.  Which might be causing low albumin.  No evidence of GN/nephrotic syndrome.  #Moderate to large pericardial effusion: Repeating echocardiogram.  Per cardiology team.  #Hypertension: Blood pressure acceptable.  On Coreg.  Thank you for the consult.  We will follow with you.  Joanna Ali 07/09/2019, 2:39 PM  [facility].

## 2019-07-10 ENCOUNTER — Telehealth: Payer: Self-pay | Admitting: Cardiovascular Disease

## 2019-07-10 LAB — RENAL FUNCTION PANEL
Albumin: 2.5 g/dL — ABNORMAL LOW (ref 3.5–5.0)
Anion gap: 15 (ref 5–15)
BUN: 72 mg/dL — ABNORMAL HIGH (ref 8–23)
CO2: 23 mmol/L (ref 22–32)
Calcium: 8.9 mg/dL (ref 8.9–10.3)
Chloride: 89 mmol/L — ABNORMAL LOW (ref 98–111)
Creatinine, Ser: 2.6 mg/dL — ABNORMAL HIGH (ref 0.44–1.00)
GFR calc Af Amer: 21 mL/min — ABNORMAL LOW (ref >60)
GFR calc non Af Amer: 18 mL/min — ABNORMAL LOW (ref >60)
Glucose, Bld: 94 mg/dL (ref 70–99)
Phosphorus: 5.6 mg/dL — ABNORMAL HIGH (ref 2.5–4.6)
Potassium: 4.1 mmol/L (ref 3.5–5.1)
Sodium: 127 mmol/L — ABNORMAL LOW (ref 135–145)

## 2019-07-10 LAB — CBC
HCT: 23.9 % — ABNORMAL LOW (ref 36.0–46.0)
Hemoglobin: 7.6 g/dL — ABNORMAL LOW (ref 12.0–15.0)
MCH: 28.5 pg (ref 26.0–34.0)
MCHC: 31.8 g/dL (ref 30.0–36.0)
MCV: 89.5 fL (ref 80.0–100.0)
Platelets: 234 10*3/uL (ref 150–400)
RBC: 2.67 MIL/uL — ABNORMAL LOW (ref 3.87–5.11)
RDW: 18.4 % — ABNORMAL HIGH (ref 11.5–15.5)
WBC: 9.1 10*3/uL (ref 4.0–10.5)
nRBC: 0.5 % — ABNORMAL HIGH (ref 0.0–0.2)

## 2019-07-10 MED ORDER — SERTRALINE HCL 25 MG PO TABS
25.0000 mg | ORAL_TABLET | Freq: Two times a day (BID) | ORAL | Status: DC
  Administered 2019-07-11 – 2019-08-23 (×79): 25 mg via ORAL
  Filled 2019-07-10 (×80): qty 1

## 2019-07-10 MED ORDER — ALBUMIN HUMAN 25 % IV SOLN
50.0000 g | Freq: Four times a day (QID) | INTRAVENOUS | Status: AC
  Administered 2019-07-10 (×2): 50 g via INTRAVENOUS
  Filled 2019-07-10 (×2): qty 200

## 2019-07-10 MED ORDER — HYDROXYZINE HCL 25 MG PO TABS
12.5000 mg | ORAL_TABLET | Freq: Two times a day (BID) | ORAL | Status: DC | PRN
  Administered 2019-07-10 – 2019-08-17 (×4): 12.5 mg via ORAL
  Filled 2019-07-10 (×5): qty 1

## 2019-07-10 NOTE — Plan of Care (Signed)
  Problem: Clinical Measurements: Goal: Respiratory complications will improve Outcome: Progressing   Problem: Activity: Goal: Risk for activity intolerance will decrease Outcome: Not Progressing Pt remains markedly weak and deconditioned.

## 2019-07-10 NOTE — Telephone Encounter (Signed)
Called and discussed with Mr. Langlinais that she is still in the hospital. Kidney functioning worsening. Repeat TTE with EF 45-50%. BNP 4000. Hemodynamic cath negative for pericardial tamponade or restrictive cardiomyopathy. Nephrology following.   [place] T. Audie Box, [place]  [address], [place], [place] [address] [phone]  11:16 AM

## 2019-07-10 NOTE — Progress Notes (Signed)
Paged by nurse as patient requests something for itching. Has been going on for months, so not clearly related to uremia (BUN normal in 03/2019). She was put on Zoloft for this with some improvement but it persists. I discussed with Dr. Gardiner Rhyme in case this presented another clue as to the unifying diagnosis. He plans to review further with Dr. Audie Box but does agree with trial of hydroxyzine as requested by nurse. Will renally-dose adjust. Melina Copa PA-C

## 2019-07-10 NOTE — Progress Notes (Addendum)
[facility] NEPHROLOGY PROGRESS NOTE  Assessment/ Plan: Pt is a 68 y.o. yo female  with history of hypertension, former smoker, hepatic steatosis, chronic pericardial effusion, moderate AVR initially admitted on 12/17 for progressive dyspnea, anasarca and fatigue, seen as a consultation for AKI and fluid overload.  #Acute kidney injury, nonoliguric: Likely hemodynamically mediated concomitant with use of IV contrast.  She probably has poor renal perfusion in the setting of pericardial effusion. Urinalysis was negative for protein.  No RBC or WBC in 03/2019.  Autoimmune work-up were unremarkable in the past. US renal: No hydronephrosis.   Decent urine output today however creatinine level continued to rise to 2.6.  On exam she has fluid overload with mostly third spacing.  She is still hesitant to start IV Lasix.  We discussed about waiting till tomorrow to monitor lab and then most likely resume diuretics.  She is worried that Lasix will cause further worsening of her renal function. Order IV albumin today. Monitor BMP, strict ins and out.  Avoid further IV contrast. No uremia.  No indication for dialysis.  #Hyponatremia, hypervolemic: Urine sodium l< 10.   She will need loop diuretics to increase free water excretion.  #Anasarca presumably due to liver disease which was diagnosed at [facility].  She has a history of liver steatosis.  Which might be causing low albumin.  No evidence of GN/nephrotic syndrome. Order 2 doses of IV albumin.  #Moderate to large pericardial effusion: Repeating echocardiogram.  Per cardiology team.  #Hypertension: Blood pressure acceptable.  On Coreg.  Subjective: Seen and examined at bedside.  Feels weak.  In room air.  Denies nausea vomiting chest pain shortness of breath.  Urine output of around 1000 cc. Objective Vital signs in last 24 hours: Vitals:   07/09/19 2108 07/10/19 0044 07/10/19 0500 07/10/19 0835  BP: 120/84 116/83 131/88   Pulse: 85  82 82 85  Resp: 20 14 15 16   Temp: (!) 97.5 F (36.4 C)  98 F (36.7 C) 97.9 F (36.6 C)  TempSrc: Oral  Oral Oral  SpO2: 98% 97% 97% 96%  Weight:   53.1 kg   Height:       Weight change: -0.7 kg  Intake/Output Summary (Last 24 hours) at 07/10/2019 1144 Last data filed at 07/10/2019 0836 Gross per 24 hour  Intake 780 ml  Output 1000 ml  Net -220 ml       Labs: Basic Metabolic Panel: Recent Labs  Lab 07/04/19 1424 07/08/19 0456 07/09/19 0347 07/10/19 0408  NA 130* 130* 128* 127*  K 3.3* 3.5 4.0 4.1  CL 90* 92* 89* 89*  CO2 25 24 25 23   GLUCOSE 147* 89 101* 94  BUN 36* 60* 66* 72*  CREATININE 1.50* 2.13* 2.35* 2.60*  CALCIUM 9.1 8.9 9.1 8.9  PHOS 5.8*  --   --  5.6*   Liver Function Tests: Recent Labs  Lab 07/03/19 1930 07/04/19 0822 07/04/19 1424 07/09/19 0347 07/10/19 0408  AST 41 40  --  37  --   ALT 18 18  --  16  --   ALKPHOS 51 46  --  57  --   BILITOT 1.1 0.7  --  0.9  --   PROT 6.4* 5.7* 6.2* 5.5*  --   ALBUMIN 3.1* 2.9* 2.9* 2.6* 2.5*   No results for input(s): LIPASE, AMYLASE in the last 168 hours. No results for input(s): AMMONIA in the last 168 hours. CBC: Recent Labs  Lab 07/06/19 0609 07/07/19 1119  07/08/19 0456 07/09/19 0347 07/10/19 0408  WBC 10.8* 9.6 9.0 10.2 9.1  HGB 8.5* 7.5* 7.6* 7.6* 7.6*  HCT 26.4* 24.0* 23.3* 24.2* 23.9*  MCV 87.7 90.6 87.9 90.0 89.5  PLT 211 197 220 251 234   Cardiac Enzymes: No results for input(s): CKTOTAL, CKMB, CKMBINDEX, TROPONINI in the last 168 hours. CBG: Recent Labs  Lab 07/04/19 0418  GLUCAP 103*    Iron Studies: No results for input(s): IRON, TIBC, TRANSFERRIN, FERRITIN in the last 72 hours. Studies/Results: US RENAL  Result Date: 07/09/2019 CLINICAL DATA:  Acute renal injury. EXAM: RENAL / URINARY TRACT ULTRASOUND COMPLETE COMPARISON:  None. FINDINGS: Right Kidney: Renal measurements: 10.9 x 3.9 x 5.8 cm = volume: 128.7 mL . Echogenicity within normal limits. No mass or  hydronephrosis visualized. Left Kidney: Renal measurements: 10.7 x 4.7 x 5.9 cm = volume: 154.0 mL. Echogenicity within normal limits. No mass or hydronephrosis visualized. Bladder: Appears normal for degree of bladder distention. Other: None. IMPRESSION: Normal study.  No cause for acute renal injury identified. Electronically Signed   By: Dorise Bullion III M.D   On: 07/09/2019 15:34   DG CHEST PORT 1 VIEW  Result Date: 07/09/2019 CLINICAL DATA:  Pleural effusion EXAM: PORTABLE CHEST 1 VIEW COMPARISON:  July 06, 2019 FINDINGS: No pneumothorax. No change in cardiomegaly. The hila and mediastinum are stable. Bilateral pulmonary opacities are stable. More focal opacity in the left retrocardiac region with a probable small associated effusion is stable. No other abnormalities. IMPRESSION: 1. No interval change in the small left effusion, focal left retrocardiac opacity, or diffuse interstitial opacities. Electronically Signed   By: Dorise Bullion III M.D   On: 07/09/2019 13:25   ECHOCARDIOGRAM LIMITED  Result Date: 07/09/2019   ECHOCARDIOGRAM LIMITED REPORT   Patient Name:   WENDELL FIEBIG Date of Exam: 07/09/2019 Medical Rec #:  [id number]    Height:       66.0 in Accession #:    [id number]   Weight:       118.6 lb Date of Birth:  07-Jul-1951    BSA:          1.60 m Patient Age:    34 years     BP:           146/95 mmHg Patient Gender: F            HR:           88 bpm. Exam Location:  Inpatient  Procedure: Limited Echo, Color Doppler and Cardiac Doppler Indications:    I31.3 Pericardial effusion  History:        Patient has prior history of Echocardiogram examinations, most                 recent 07/04/2019.  Sonographer:    Raquel Sarna Senior RDCS Referring Phys: [id number] [place]  1. Left ventricular ejection fraction, by visual estimation, is 45 to 50%. The left ventricle has moderately decreased function. There is no left ventricular hypertrophy.  2. The left ventricle demonstrates  global hypokinesis.  3. Global right ventricle was not assessed.The right ventricular size is not well visualized. Right vetricular wall thickness was not assessed.  4. Left atrial size was not assessed.  5. Moderate pericardial effusion.  6. The pericardial effusion is circumferential.  7. The mitral valve is grossly normal. Mild mitral valve regurgitation.  8. The tricuspid valve is grossly normal. Tricuspid valve regurgitation moderate-severe.  9. The tricuspid valve was normal in structure.  Tricuspid valve regurgitation moderate-severe. 10. Aortic valve regurgitation is moderate to severe. 11. The aortic valve is tricuspid. Aortic valve regurgitation is moderate to severe. 12. Aortic root could not be assessed. 13. Mildly elevated pulmonary artery systolic pressure. 14. The inferior vena cava is normal in size with <50% respiratory variability, suggesting right atrial pressure of 8 mmHg. 15. Limited echo. Effusion appears similar to just slightly enlarged from prior. Aortic regurgitation has been previously evaluated by TEE; incomplete evaluation on this study but cannot exclude severe AR based on images. 16. The interatrial septum was not assessed. FINDINGS  Left Ventricle: Left ventricular ejection fraction, by visual estimation, is 45 to 50%. The left ventricle has moderately decreased function. The left ventricle demonstrates global hypokinesis. Right Ventricle: The right ventricular size is not assessed. Right vetricular wall thickness was not assessed. Global RV systolic function is was not assessed. The tricuspid regurgitant velocity is 2.57 m/s, and with an assumed right atrial pressure of 8  mmHg, the estimated right ventricular systolic pressure is mildly elevated at 34.4 mmHg. Left Atrium: Left atrial size was not assessed. Right Atrium: Right atrial size was not assessed. Right atrial pressure is estimated at 8 mmHg. Pericardium: A moderately sized pericardial effusion is present is seen. A  moderately sized pericardial effusion is present. The pericardial effusion is circumferential. Tricuspid Valve: The tricuspid valve is normal in structure. Tricuspid valve regurgitation moderate-severe. Aortic Valve: The aortic valve is tricuspid. Aortic valve regurgitation is moderate to severe. Pulmonic Valve: The pulmonic valve was grossly normal. Pulmonic valve regurgitation is mild to moderate by color flow Doppler. Pulmonic regurgitation is mild to moderate by color flow Doppler. Aorta: Aortic root could not be assessed. Venous: The inferior vena cava is normal in size with less than 50% respiratory variability, suggesting right atrial pressure of 8 mmHg. Shunts: The interatrial septum was not assessed.   LV Volumes (MOD)             Normals LV area d, A2C:    23.00 cm LV area d, A4C:    25.40 cm LV area s, A2C:    18.30 cm LV area s, A4C:    17.90 cm LV major d, A2C:   6.58 cm LV major d, A4C:   6.80 cm LV major s, A2C:   6.41 cm LV major s, A4C:   6.25 cm LV vol d, MOD A2C: 69.4 ml   68 ml LV vol d, MOD A4C: 79.1 ml LV vol s, MOD A2C: 44.5 ml   24 ml LV vol s, MOD A4C: 44.5 ml LV SV MOD A2C:     24.9 ml LV SV MOD A4C:     79.1 ml LV SV MOD BP:      29.9 ml   45 ml RIGHT VENTRICLE RV S prime:     8.92 cm/s TAPSE (M-mode): 1.8 cm TRICUSPID VALVE             Normals TR Peak grad:   26.4 mmHg TR Vmax:        257.00 cm/s 288 cm/s  Buford Dresser MD Electronically signed by Buford Dresser MD Signature Date/Time: 07/09/2019/5:23:10 PM    Final     Medications: Infusions: . sodium chloride 500 mL (07/06/19 1043)  . sodium chloride    . albumin human      Scheduled Medications: . carvedilol  3.125 mg Oral BID WC  . Chlorhexidine Gluconate Cloth  6 each Topical Daily  . enoxaparin (LOVENOX) injection  30  mg Subcutaneous Q24H  . feeding supplement (ENSURE ENLIVE)  237 mL Oral TID BM  . gabapentin  300 mg Oral TID  . mouth rinse  15 mL Mouth Rinse BID  . sertraline  25 mg Oral BID  .  sodium chloride flush  3 mL Intravenous Q12H  . sodium chloride flush  3 mL Intravenous Q12H    have reviewed scheduled and prn medications.  Physical Exam: General:NAD, comfortable Heart:RRR, s1s2 nl Lungs:clear b/l, no crackle Abdomen:soft, Non-tender, non-distended Extremities: Bilateral lower extremity pitting edema Neurology: Alert, awake and following commands.  No asterixis.  Voncile Schwarz Prasad Timouthy Gilardi 07/10/2019,11:44 AM  LOS: 6 days  Pager: [phone]

## 2019-07-10 NOTE — Progress Notes (Signed)
Progress Note  [name] Date of Encounter: 07/10/2019  Primary Cardiologist: Evalina Field, MD   Subjective   TTE yesterday shows EF 45-50% with slight enlargement in pericardial effusion.  Moderate effusion, no evidence of tamponade.  Lasix held, was net even yesterday, with worsening renal function today (2.4 ->2.6).  Reports continues to feel short of breath.  Inpatient Medications    Scheduled Meds: . carvedilol  3.125 mg Oral BID WC  . Chlorhexidine Gluconate Cloth  6 each Topical Daily  . enoxaparin (LOVENOX) injection  30 mg Subcutaneous Q24H  . feeding supplement (ENSURE ENLIVE)  237 mL Oral TID BM  . gabapentin  300 mg Oral TID  . mouth rinse  15 mL Mouth Rinse BID  . sertraline  25 mg Oral BID  . sodium chloride flush  3 mL Intravenous Q12H  . sodium chloride flush  3 mL Intravenous Q12H   Continuous Infusions: . sodium chloride 500 mL (07/06/19 1043)  . sodium chloride     PRN Meds: sodium chloride, sodium chloride, acetaminophen, camphor-menthol, ondansetron (ZOFRAN) IV, sodium chloride flush, sodium chloride flush, traMADol   Vital Signs    Vitals:   07/09/19 2108 07/10/19 0044 07/10/19 0500 07/10/19 0835  BP: 120/84 116/83 131/88   Pulse: 85 82 82 85  Resp: 20 14 15 16   Temp: (!) 97.5 F (36.4 C)  98 F (36.7 C) 97.9 F (36.6 C)  TempSrc: Oral  Oral Oral  SpO2: 98% 97% 97% 96%  Weight:   53.1 kg   Height:        Intake/Output Summary (Last 24 hours) at 07/10/2019 9604 Last data filed at 07/10/2019 5409 Gross per 24 hour  Intake 780 ml  Output 1000 ml  Net -220 ml   Last 3 Weights 07/10/2019 07/09/2019 07/08/2019  Weight (lbs) 117 lb 1 oz 118 lb 9.7 oz 113 lb 5.1 oz  Weight (kg) 53.1 kg 53.8 kg 51.4 kg      Telemetry    Sinus rhythm 80s - Personally Reviewed  ECG    No new tracings - Personally Reviewed  Physical Exam   GEN: frail appearing elderly female  Neck: + JVD Cardiac: RRR, no murmurs Respiratory:  crackles at R lung base GI: Soft, nontender, non-distended  MS: trace edema; No deformity. Neuro:  Nonfocal  Psych: Normal affect   Labs    High Sensitivity Troponin:   Recent Labs  Lab 07/03/19 1739 07/03/19 1930  TROPONINIHS 149* 163*      Chemistry Recent Labs  Lab 07/03/19 1930 07/04/19 0822 07/04/19 1424 07/08/19 0456 07/09/19 0347 07/10/19 0408  NA  --  133* 130* 130* 128* 127*  K  --  3.7 3.3* 3.5 4.0 4.1  CL  --  95* 90* 92* 89* 89*  CO2  --  10* 25 24 25 23   GLUCOSE  --  98 147* 89 101* 94  BUN  --  36* 36* 60* 66* 72*  CREATININE  --  1.50* 1.50* 2.13* 2.35* 2.60*  CALCIUM  --  9.0 9.1 8.9 9.1 8.9  PROT 6.4* 5.7* 6.2*  --  5.5*  --   ALBUMIN 3.1* 2.9* 2.9*  --  2.6* 2.5*  AST 41 40  --   --  37  --   ALT 18 18  --   --  16  --   ALKPHOS 51 46  --   --  57  --   BILITOT 1.1 0.7  --   --  0.9  --   GFRNONAA  --  35* 35* 23* 20* 18*  GFRAA  --  40* 40* 26* 24* 21*  ANIONGAP  --  28* 15 14 14 15      Hematology Recent Labs  Lab 07/08/19 0456 07/09/19 0347 07/10/19 0408  WBC 9.0 10.2 9.1  RBC 2.65* 2.69* 2.67*  HGB 7.6* 7.6* 7.6*  HCT 23.3* 24.2* 23.9*  MCV 87.9 90.0 89.5  MCH 28.7 28.3 28.5  MCHC 32.6 31.4 31.8  RDW 16.8* 18.0* 18.4*  PLT 220 251 234    BNP Recent Labs  Lab 07/03/19 1930 07/09/19 0347  BNP 1,124.1* 4,004.9*     DDimer  Recent Labs  Lab 07/03/19 2052  DDIMER 3.82*     Radiology    US RENAL  Result Date: 07/09/2019 CLINICAL DATA:  Acute renal injury. EXAM: RENAL / URINARY TRACT ULTRASOUND COMPLETE COMPARISON:  None. FINDINGS: Right Kidney: Renal measurements: 10.9 x 3.9 x 5.8 cm = volume: 128.7 mL . Echogenicity within normal limits. No mass or hydronephrosis visualized. Left Kidney: Renal measurements: 10.7 x 4.7 x 5.9 cm = volume: 154.0 mL. Echogenicity within normal limits. No mass or hydronephrosis visualized. Bladder: Appears normal for degree of bladder distention. Other: None. IMPRESSION: Normal study.  No  cause for acute renal injury identified. Electronically Signed   By: Dorise Bullion III M.D   On: 07/09/2019 15:34   DG CHEST PORT 1 VIEW  Result Date: 07/09/2019 CLINICAL DATA:  Pleural effusion EXAM: PORTABLE CHEST 1 VIEW COMPARISON:  July 06, 2019 FINDINGS: No pneumothorax. No change in cardiomegaly. The hila and mediastinum are stable. Bilateral pulmonary opacities are stable. More focal opacity in the left retrocardiac region with a probable small associated effusion is stable. No other abnormalities. IMPRESSION: 1. No interval change in the small left effusion, focal left retrocardiac opacity, or diffuse interstitial opacities. Electronically Signed   By: Dorise Bullion III M.D   On: 07/09/2019 13:25   ECHOCARDIOGRAM LIMITED  Result Date: 07/09/2019   ECHOCARDIOGRAM LIMITED REPORT   Patient Name:   Joanna Ali Date of Exam: 07/09/2019 Medical Rec #:  [id number]    Height:       66.0 in Accession #:    [id number]   Weight:       118.6 lb Date of Birth:  Sep 14, 1950    BSA:          1.60 m Patient Age:    68 years     BP:           146/95 mmHg Patient Gender: F            HR:           88 bpm. Exam Location:  Inpatient  Procedure: Limited Echo, Color Doppler and Cardiac Doppler Indications:    I31.3 Pericardial effusion  History:        Patient has prior history of Echocardiogram examinations, most                 recent 07/04/2019.  Sonographer:    Raquel Sarna Senior RDCS Referring Phys: [id number] [place]  1. Left ventricular ejection fraction, by visual estimation, is 45 to 50%. The left ventricle has moderately decreased function. There is no left ventricular hypertrophy.  2. The left ventricle demonstrates global hypokinesis.  3. Global right ventricle was not assessed.The right ventricular size is not well visualized. Right vetricular wall thickness was not assessed.  4. Left atrial size was not assessed.  5. Moderate pericardial effusion.  6. The pericardial effusion is  circumferential.  7. The mitral valve is grossly normal. Mild mitral valve regurgitation.  8. The tricuspid valve is grossly normal. Tricuspid valve regurgitation moderate-severe.  9. The tricuspid valve was normal in structure. Tricuspid valve regurgitation moderate-severe. 10. Aortic valve regurgitation is moderate to severe. 11. The aortic valve is tricuspid. Aortic valve regurgitation is moderate to severe. 12. Aortic root could not be assessed. 13. Mildly elevated pulmonary artery systolic pressure. 14. The inferior vena cava is normal in size with <50% respiratory variability, suggesting right atrial pressure of 8 mmHg. 15. Limited echo. Effusion appears similar to just slightly enlarged from prior. Aortic regurgitation has been previously evaluated by TEE; incomplete evaluation on this study but cannot exclude severe AR based on images. 16. The interatrial septum was not assessed. FINDINGS  Left Ventricle: Left ventricular ejection fraction, by visual estimation, is 45 to 50%. The left ventricle has moderately decreased function. The left ventricle demonstrates global hypokinesis. Right Ventricle: The right ventricular size is not assessed. Right vetricular wall thickness was not assessed. Global RV systolic function is was not assessed. The tricuspid regurgitant velocity is 2.57 m/s, and with an assumed right atrial pressure of 8  mmHg, the estimated right ventricular systolic pressure is mildly elevated at 34.4 mmHg. Left Atrium: Left atrial size was not assessed. Right Atrium: Right atrial size was not assessed. Right atrial pressure is estimated at 8 mmHg. Pericardium: A moderately sized pericardial effusion is present is seen. A moderately sized pericardial effusion is present. The pericardial effusion is circumferential. Tricuspid Valve: The tricuspid valve is normal in structure. Tricuspid valve regurgitation moderate-severe. Aortic Valve: The aortic valve is tricuspid. Aortic valve regurgitation is  moderate to severe. Pulmonic Valve: The pulmonic valve was grossly normal. Pulmonic valve regurgitation is mild to moderate by color flow Doppler. Pulmonic regurgitation is mild to moderate by color flow Doppler. Aorta: Aortic root could not be assessed. Venous: The inferior vena cava is normal in size with less than 50% respiratory variability, suggesting right atrial pressure of 8 mmHg. Shunts: The interatrial septum was not assessed.   LV Volumes (MOD)             Normals LV area d, A2C:    23.00 cm LV area d, A4C:    25.40 cm LV area s, A2C:    18.30 cm LV area s, A4C:    17.90 cm LV major d, A2C:   6.58 cm LV major d, A4C:   6.80 cm LV major s, A2C:   6.41 cm LV major s, A4C:   6.25 cm LV vol d, MOD A2C: 69.4 ml   68 ml LV vol d, MOD A4C: 79.1 ml LV vol s, MOD A2C: 44.5 ml   24 ml LV vol s, MOD A4C: 44.5 ml LV SV MOD A2C:     24.9 ml LV SV MOD A4C:     79.1 ml LV SV MOD BP:      29.9 ml   45 ml RIGHT VENTRICLE RV S prime:     8.92 cm/s TAPSE (M-mode): 1.8 cm TRICUSPID VALVE             Normals TR Peak grad:   26.4 mmHg TR Vmax:        257.00 cm/s 288 cm/s  Buford Dresser MD Electronically signed by Buford Dresser MD Signature Date/Time: 07/09/2019/5:23:10 PM    Final     Cardiac Studies   Right and  left heart cath 07/06/19:  Findings:  RA = 7  RV = 42/7 PA = 48/17 (28) PCW = 16 LV = 122/8 Fick cardiac output/index = 5.0/3.2 PVR = 2.4 WU FA sat = 98% PA sat = 62%, 62%  Prominent y-descents in RA tracing but no square root sign in ventricular tracings and no RV/LV interdeprendence  After 350 cc NS bolus  RA = 9 RV = 46/9 PA = 48/24 (33) PCW = 23 LV = 125/15  Prominent y-descents in RA tracing but no square root sign in ventricular tracings and no RV/LV interdeprendence  Assessment: 1. Moderate pericardial effusion 2. Mild PAH 3. 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  D/w Dr. Marlou Porch. If effusion becomes hemodynamically significant in future can consider window.   Patient Profile     68 y.o. female with anasarca thought to be liver related with elevated pulmonary pressures normal EF chronic pericardial effusion pleural effusion negative PE increased dyspnea on exertion unremarkable thyroid function negative proteinuria.  Assessment & Plan    1. Anasarca 2. Dyspnea 3. Bilateral pleural effusions 4. Moderate to large circumferential pericardial effusion  5. Hypertension - anasarca presumably due to liver disease diagnosed at [facility], but noted to be a questionable diagnosis - does have steatosis - symptoms seem out of proportion to her previous alcohol use - COVID negative - CTA negative for PE, but bilateral pleural effusions s/p bilateral thoracenteses - right and left heart cath negative for tamponade or constrictive pericarditis; cardiac output was 5L/min - PYP scan negative for transthyretin amyloidosis - carvedilol was started at 3.125 mg BID - echo shows moderate pericardial effusion, no evidence of tramponade. EF 45-50%   6. AKI - sCr 2.6 today (up from 2.4) - lasix and spiro on hold, creatine continues to rise.  May need to resume diuresis.  Nephrology consulted, appreciate recs   7. Anemia of chronic disease - Hb 7.6 - appears to be her baseline   8. Albuminemia  - albumin 2.5 - she is drinking protein supplements with meals, eating OK    For questions or updates, please contact [place] Please consult [email or web address] for contact info under        Signed, Donato Heinz, MD  07/10/2019, 9:18 AM

## 2019-07-11 DIAGNOSIS — I5021 Acute systolic (congestive) heart failure: Secondary | ICD-10-CM

## 2019-07-11 LAB — RENAL FUNCTION PANEL
Albumin: 3.9 g/dL (ref 3.5–5.0)
Anion gap: 15 (ref 5–15)
BUN: 78 mg/dL — ABNORMAL HIGH (ref 8–23)
CO2: 24 mmol/L (ref 22–32)
Calcium: 9.5 mg/dL (ref 8.9–10.3)
Chloride: 86 mmol/L — ABNORMAL LOW (ref 98–111)
Creatinine, Ser: 2.86 mg/dL — ABNORMAL HIGH (ref 0.44–1.00)
GFR calc Af Amer: 19 mL/min — ABNORMAL LOW (ref >60)
GFR calc non Af Amer: 16 mL/min — ABNORMAL LOW (ref >60)
Glucose, Bld: 94 mg/dL (ref 70–99)
Phosphorus: 6.1 mg/dL — ABNORMAL HIGH (ref 2.5–4.6)
Potassium: 4.1 mmol/L (ref 3.5–5.1)
Sodium: 125 mmol/L — ABNORMAL LOW (ref 135–145)

## 2019-07-11 LAB — HEMOGLOBIN AND HEMATOCRIT, BLOOD
HCT: 28.1 % — ABNORMAL LOW (ref 36.0–46.0)
Hemoglobin: 8.9 g/dL — ABNORMAL LOW (ref 12.0–15.0)

## 2019-07-11 LAB — FERRITIN: Ferritin: 283 ng/mL (ref 11–307)

## 2019-07-11 LAB — CBC
HCT: 22.3 % — ABNORMAL LOW (ref 36.0–46.0)
Hemoglobin: 6.8 g/dL — CL (ref 12.0–15.0)
MCH: 28 pg (ref 26.0–34.0)
MCHC: 30.5 g/dL (ref 30.0–36.0)
MCV: 91.8 fL (ref 80.0–100.0)
Platelets: 220 10*3/uL (ref 150–400)
RBC: 2.43 MIL/uL — ABNORMAL LOW (ref 3.87–5.11)
RDW: 18.7 % — ABNORMAL HIGH (ref 11.5–15.5)
WBC: 7.6 10*3/uL (ref 4.0–10.5)
nRBC: 1 % — ABNORMAL HIGH (ref 0.0–0.2)

## 2019-07-11 LAB — VITAMIN B12: Vitamin B-12: 792 pg/mL (ref 180–914)

## 2019-07-11 LAB — RETICULOCYTES
Immature Retic Fract: 41.6 % — ABNORMAL HIGH (ref 2.3–15.9)
RBC.: 2.76 MIL/uL — ABNORMAL LOW (ref 3.87–5.11)
Retic Count, Absolute: 269.4 10*3/uL — ABNORMAL HIGH (ref 19.0–186.0)
Retic Ct Pct: 9.8 % — ABNORMAL HIGH (ref 0.4–3.1)

## 2019-07-11 LAB — FOLATE: Folate: 20.8 ng/mL (ref >5.9)

## 2019-07-11 LAB — PREPARE RBC (CROSSMATCH)

## 2019-07-11 LAB — IRON AND TIBC
Iron: 38 ug/dL (ref 28–170)
Saturation Ratios: 15 % (ref 10.4–31.8)
TIBC: 252 ug/dL (ref 250–450)
UIBC: 214 ug/dL

## 2019-07-11 MED ORDER — DIPHENHYDRAMINE HCL 25 MG PO CAPS
25.0000 mg | ORAL_CAPSULE | Freq: Once | ORAL | Status: DC
  Filled 2019-07-11: qty 1

## 2019-07-11 MED ORDER — SODIUM CHLORIDE 0.9% IV SOLUTION: Freq: Once | INTRAVENOUS | Status: AC

## 2019-07-11 MED ORDER — FUROSEMIDE 10 MG/ML IJ SOLN
80.0000 mg | Freq: Two times a day (BID) | INTRAMUSCULAR | Status: DC
  Administered 2019-07-11 (×2): 80 mg via INTRAVENOUS
  Filled 2019-07-11: qty 8

## 2019-07-11 MED ORDER — FUROSEMIDE 10 MG/ML IJ SOLN
80.0000 mg | Freq: Once | INTRAMUSCULAR | Status: AC
  Administered 2019-07-11: 80 mg via INTRAVENOUS
  Filled 2019-07-11: qty 8

## 2019-07-11 NOTE — Progress Notes (Signed)
Nutrition Follow-up  DOCUMENTATION CODES:   Severe malnutrition in context of chronic illness, Underweight  INTERVENTION:   -Ensure Enlive po TID, each supplement provides 350 kcal and 20 grams of protein -Multivitamin with minerals daily  If PO intakes do not improve, may need to consider Cortrak tube placement given severe malnutrition. Next services available 12/23 & 12/28  NUTRITION DIAGNOSIS:   Severe Malnutrition related to chronic illness(pericardial effusion w/ volume overload) as evidenced by moderate fat depletion, severe muscle depletion, percent weight loss.  Ongoing.  GOAL:   Patient will meet greater than or equal to 90% of their needs  Progressing.  MONITOR:   PO intake, Supplement acceptance, Weight trends, Labs, I & O's, Skin  REASON FOR ASSESSMENT:   Malnutrition Screening Tool, Consult Assessment of nutrition requirement/status  ASSESSMENT:   Patient with PMH significant for chronic pericardial effusion, moderate AVR, HTN, hepatic steatosis, and peripheral neuropathy. Presents this admission with anasarca and bilateral pleural effusions.  12/15- s/p bilateral thoracentesis- 700 ml fluid removed 12/17- s/p cardiac cath  **RD working remotely.  Patient consuming 20-50% of meals at this time. Pt drinking ~1 Ensure daily.   Admission weight: 114 lbs. Current weight: 121 lbs. Per nursing documentation, pt with moderate BLE edema.  Medications: IV Lasix  Labs reviewed: Low Na Phos 6.1  Diet Order:   Diet Order            Diet Heart Room service appropriate? Yes; Fluid consistency: Thin  Diet effective now              EDUCATION NEEDS:   Education needs have been addressed  Skin:  Skin Assessment: Skin Integrity Issues: Skin Integrity Issues:: Other (Comment) Other: bilateral legs cracking-dry  Last BM:  12/19 -type 5  Height:   Ht Readings from Last 1 Encounters:  07/04/19 5' 6"  (1.676 m)    Weight:   Wt Readings from Last  1 Encounters:  07/11/19 54.9 kg    Ideal Body Weight:  59.1 kg  BMI:  Body mass index is 19.54 kg/m.  Estimated Nutritional Needs:   Kcal:  1600-1800 kcal  Protein:  80-95 grams  Fluid:  >/= 1.6 L/day  Clayton Bibles, MS, RD, LDN Inpatient Clinical Dietitian Pager: [phone] After Hours Pager: [phone]

## 2019-07-11 NOTE — Plan of Care (Signed)
  Problem: Education: Goal: Knowledge of General Education information will improve Description: Including pain rating scale, medication(s)/side effects and non-pharmacologic comfort measures Outcome: Progressing   Problem: Clinical Measurements: Goal: Respiratory complications will improve Outcome: Progressing   Problem: Nutrition: Goal: Adequate nutrition will be maintained Outcome: Progressing   Problem: Coping: Goal: Level of anxiety will decrease Outcome: Progressing   Problem: Pain Managment: Goal: General experience of comfort will improve Outcome: Progressing

## 2019-07-11 NOTE — Progress Notes (Signed)
Provider on call, Dr Radford Pax notified via amion Hgb 6.8 on am labs. No obvious signs of bleeding noted. Will continue to monitor. Jessie Foot, RN

## 2019-07-11 NOTE — Progress Notes (Addendum)
Progress Note  [name] Date of Encounter: 07/11/2019  Primary Cardiologist: Evalina Field, MD   Subjective   12/21: TTE yesterday shows EF 45-50% with slight enlargement in pericardial effusion.  Moderate effusion, no evidence of tamponade.  Lasix held, was net even yesterday, with worsening renal function today (2.4 ->2.6).  Reports continues to feel short of breath.  12/22: says she feels a little better today. Extremely weak, SOB w/ minimal activity.  Inpatient Medications    Scheduled Meds: . carvedilol  3.125 mg Oral BID WC  . enoxaparin (LOVENOX) injection  30 mg Subcutaneous Q24H  . feeding supplement (ENSURE ENLIVE)  237 mL Oral TID BM  . gabapentin  300 mg Oral TID  . mouth rinse  15 mL Mouth Rinse BID  . sertraline  25 mg Oral BID  . sodium chloride flush  3 mL Intravenous Q12H  . sodium chloride flush  3 mL Intravenous Q12H   Continuous Infusions: . sodium chloride 500 mL (07/06/19 1043)  . sodium chloride     PRN Meds: sodium chloride, sodium chloride, acetaminophen, camphor-menthol, hydrOXYzine, ondansetron (ZOFRAN) IV, sodium chloride flush, sodium chloride flush, traMADol   Vital Signs    Vitals:   07/10/19 1658 07/10/19 2030 07/11/19 0006 07/11/19 0520  BP:  103/80 113/77 119/82  Pulse: 84 76 80 79  Resp:      Temp: (!) 97.4 F (36.3 C) (!) 97.3 F (36.3 C) (!) 97.5 F (36.4 C) (!) 97.4 F (36.3 C)  TempSrc: Oral Oral Oral Oral  SpO2: 95% 98% 96% 96%  Weight:    54.9 kg  Height:        Intake/Output Summary (Last 24 hours) at 07/11/2019 0800 Last data filed at 07/10/2019 1900 Gross per 24 hour  Intake 420 ml  Output 550 ml  Net -130 ml   Last 3 Weights 07/11/2019 07/10/2019 07/09/2019  Weight (lbs) 121 lb 0.5 oz 117 lb 1 oz 118 lb 9.7 oz  Weight (kg) 54.9 kg 53.1 kg 53.8 kg      Telemetry    SR, no sig ectopy - Personally Reviewed  ECG    No new tracings - Personally Reviewed  Physical Exam   General:  frail, cachectic, female in no acute distress Head: Eyes PERRLA, Head normocephalic and atraumatic Lungs: rales bases bilaterally to auscultation. Heart: HRRR, S1 S2, without rub or gallop. 3/6 murmur. Upper extremity pulses are 2+ & equal. JVD to jaw. Cap refill on feet wnl but pulses decreased Abdomen: Bowel sounds are present, abdomen soft and non-tender without masses or  hernias noted. Msk: weak strength and tone for age. Extremities: No clubbing, cyanosis or edema.    Skin:  No rashes or lesions noted. Neuro: Alert and oriented X 3. Psych:  Kermit Balo affect, responds appropriately  Labs    High Sensitivity Troponin:   Recent Labs  Lab 07/03/19 1739 07/03/19 1930  TROPONINIHS 149* 163*      Chemistry Recent Labs  Lab 07/04/19 0822 07/04/19 1424 07/09/19 0347 07/10/19 0408 07/11/19 0355  NA 133* 130* 128* 127* 125*  K 3.7 3.3* 4.0 4.1 4.1  CL 95* 90* 89* 89* 86*  CO2 10* 25 25 23 24   GLUCOSE 98 147* 101* 94 94  BUN 36* 36* 66* 72* 78*  CREATININE 1.50* 1.50* 2.35* 2.60* 2.86*  CALCIUM 9.0 9.1 9.1 8.9 9.5  PROT 5.7* 6.2* 5.5*  --   --   ALBUMIN 2.9* 2.9* 2.6* 2.5* 3.9  AST  40  --  37  --   --   ALT 18  --  16  --   --   ALKPHOS 46  --  57  --   --   BILITOT 0.7  --  0.9  --   --   GFRNONAA 35* 35* 20* 18* 16*  GFRAA 40* 40* 24* 21* 19*  ANIONGAP 28* 15 14 15 15      Hematology Recent Labs  Lab 07/09/19 0347 07/10/19 0408 07/11/19 0355  WBC 10.2 9.1 7.6  RBC 2.69* 2.67* 2.43*  HGB 7.6* 7.6* 6.8*  HCT 24.2* 23.9* 22.3*  MCV 90.0 89.5 91.8  MCH 28.3 28.5 28.0  MCHC 31.4 31.8 30.5  RDW 18.0* 18.4* 18.7*  PLT 251 234 220    BNP Recent Labs  Lab 07/09/19 0347  BNP 4,004.9*      Radiology    US RENAL  Result Date: 07/09/2019 CLINICAL DATA:  Acute renal injury. EXAM: RENAL / URINARY TRACT ULTRASOUND COMPLETE COMPARISON:  None. FINDINGS: Right Kidney: Renal measurements: 10.9 x 3.9 x 5.8 cm = volume: 128.7 mL . Echogenicity within normal limits. No  mass or hydronephrosis visualized. Left Kidney: Renal measurements: 10.7 x 4.7 x 5.9 cm = volume: 154.0 mL. Echogenicity within normal limits. No mass or hydronephrosis visualized. Bladder: Appears normal for degree of bladder distention. Other: None. IMPRESSION: Normal study.  No cause for acute renal injury identified. Electronically Signed   By: Dorise Bullion III M.D   On: 07/09/2019 15:34   DG CHEST PORT 1 VIEW  Result Date: 07/09/2019 CLINICAL DATA:  Pleural effusion EXAM: PORTABLE CHEST 1 VIEW COMPARISON:  July 06, 2019 FINDINGS: No pneumothorax. No change in cardiomegaly. The hila and mediastinum are stable. Bilateral pulmonary opacities are stable. More focal opacity in the left retrocardiac region with a probable small associated effusion is stable. No other abnormalities. IMPRESSION: 1. No interval change in the small left effusion, focal left retrocardiac opacity, or diffuse interstitial opacities. Electronically Signed   By: Dorise Bullion III M.D   On: 07/09/2019 13:25   ECHOCARDIOGRAM LIMITED  Result Date: 07/09/2019   ECHOCARDIOGRAM LIMITED REPORT   Patient Name:   Joanna Ali Date of Exam: 07/09/2019 Medical Rec #:  [id number]    Height:       66.0 in Accession #:    [id number]   Weight:       118.6 lb Date of Birth:  1951-03-23    BSA:          1.60 m Patient Age:    68 years     BP:           146/95 mmHg Patient Gender: F            HR:           88 bpm. Exam Location:  Inpatient  Procedure: Limited Echo, Color Doppler and Cardiac Doppler Indications:    I31.3 Pericardial effusion  History:        Patient has prior history of Echocardiogram examinations, most                 recent 07/04/2019.  Sonographer:    Raquel Sarna Senior RDCS Referring Phys: [id number] [place]  1. Left ventricular ejection fraction, by visual estimation, is 45 to 50%. The left ventricle has moderately decreased function. There is no left ventricular hypertrophy.  2. The left ventricle  demonstrates global hypokinesis.  3. Global right ventricle was not assessed.The right  ventricular size is not well visualized. Right vetricular wall thickness was not assessed.  4. Left atrial size was not assessed.  5. Moderate pericardial effusion.  6. The pericardial effusion is circumferential.  7. The mitral valve is grossly normal. Mild mitral valve regurgitation.  8. The tricuspid valve is grossly normal. Tricuspid valve regurgitation moderate-severe.  9. The tricuspid valve was normal in structure. Tricuspid valve regurgitation moderate-severe. 10. Aortic valve regurgitation is moderate to severe. 11. The aortic valve is tricuspid. Aortic valve regurgitation is moderate to severe. 12. Aortic root could not be assessed. 13. Mildly elevated pulmonary artery systolic pressure. 14. The inferior vena cava is normal in size with <50% respiratory variability, suggesting right atrial pressure of 8 mmHg. 15. Limited echo. Effusion appears similar to just slightly enlarged from prior. Aortic regurgitation has been previously evaluated by TEE; incomplete evaluation on this study but cannot exclude severe AR based on images. 16. The interatrial septum was not assessed. FINDINGS  Left Ventricle: Left ventricular ejection fraction, by visual estimation, is 45 to 50%. The left ventricle has moderately decreased function. The left ventricle demonstrates global hypokinesis. Right Ventricle: The right ventricular size is not assessed. Right vetricular wall thickness was not assessed. Global RV systolic function is was not assessed. The tricuspid regurgitant velocity is 2.57 m/s, and with an assumed right atrial pressure of 8  mmHg, the estimated right ventricular systolic pressure is mildly elevated at 34.4 mmHg. Left Atrium: Left atrial size was not assessed. Right Atrium: Right atrial size was not assessed. Right atrial pressure is estimated at 8 mmHg. Pericardium: A moderately sized pericardial effusion is present is  seen. A moderately sized pericardial effusion is present. The pericardial effusion is circumferential. Tricuspid Valve: The tricuspid valve is normal in structure. Tricuspid valve regurgitation moderate-severe. Aortic Valve: The aortic valve is tricuspid. Aortic valve regurgitation is moderate to severe. Pulmonic Valve: The pulmonic valve was grossly normal. Pulmonic valve regurgitation is mild to moderate by color flow Doppler. Pulmonic regurgitation is mild to moderate by color flow Doppler. Aorta: Aortic root could not be assessed. Venous: The inferior vena cava is normal in size with less than 50% respiratory variability, suggesting right atrial pressure of 8 mmHg. Shunts: The interatrial septum was not assessed.   LV Volumes (MOD)             Normals LV area d, A2C:    23.00 cm LV area d, A4C:    25.40 cm LV area s, A2C:    18.30 cm LV area s, A4C:    17.90 cm LV major d, A2C:   6.58 cm LV major d, A4C:   6.80 cm LV major s, A2C:   6.41 cm LV major s, A4C:   6.25 cm LV vol d, MOD A2C: 69.4 ml   68 ml LV vol d, MOD A4C: 79.1 ml LV vol s, MOD A2C: 44.5 ml   24 ml LV vol s, MOD A4C: 44.5 ml LV SV MOD A2C:     24.9 ml LV SV MOD A4C:     79.1 ml LV SV MOD BP:      29.9 ml   45 ml RIGHT VENTRICLE RV S prime:     8.92 cm/s TAPSE (M-mode): 1.8 cm TRICUSPID VALVE             Normals TR Peak grad:   26.4 mmHg TR Vmax:        257.00 cm/s 288 cm/s  Buford Dresser MD Electronically signed by  Buford Dresser MD Signature Date/Time: 07/09/2019/5:23:10 PM    Final     Cardiac Studies   Right and left heart cath 07/06/19:  Findings:  RA = 7  RV = 42/7 PA = 48/17 (28) PCW = 16 LV = 122/8 Fick cardiac output/index = 5.0/3.2 PVR = 2.4 WU FA sat = 98% PA sat = 62%, 62%  Prominent y-descents in RA tracing but no square root sign in ventricular tracings and no RV/LV interdeprendence  After 350 cc NS bolus  RA = 9 RV = 46/9 PA = 48/24 (33) PCW = 23 LV = 125/15  Prominent y-descents in  RA tracing but no square root sign in ventricular tracings and no RV/LV interdeprendence  Assessment: 1. Moderate pericardial effusion 2. Mild PAH 3. 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  D/w Dr. Marlou Porch. If effusion becomes hemodynamically significant in future can consider window.   Patient Profile     68 y.o. female with anasarca thought to be liver related with elevated pulmonary pressures normal EF chronic pericardial effusion pleural effusion negative PE increased dyspnea on exertion unremarkable thyroid function negative proteinuria.  Assessment & Plan    1. Anasarca 2. Dyspnea 3. Bilateral pleural effusions 4. Moderate to large circumferential pericardial effusion  5. Hypertension - anasarca felt 2nd liver dz (dx [facility]), but questionable, no steatosis - ETOH use does not completely explain things - COVID NEG - pleural eff s/p bilateral t-cenetesis w/ 700 ml removed each side - R heart cath w/ no tamponade or constrictive pericarditis, C.O. 5 L/min - no TTR amyloid by PYP scan - EF 45-50% w/ mod effusion, no tamponade - wt trending up, I/O either incomplete or w/ poor po intake - will start IV lasix 80 mg BID today - possibly worsening AI on TTE.  Will plan for non-contrast CMR once diuresed and better able to lie flat to quantify AI and LV systolic function  6. AKI - no Lasix or spiro since 12/16 - Cr continues to rise  - admit BUN/Cr 33/1.43>>>78/2.86 today - Nephrology has seen, appreciate the help  7. Anemia of chronic disease - H&H 8.1/25.9 on admission>>6.8/22.3 today - Transfuse 1 U, give 80 mg Lasix after that. Anemia work-up with iron studies/haptoglobin/reticulocytes/B12/folate  8. Albuminemia  - albumin 3.1 on admission>>2.5>>3.9 after albumin infusion x  2 - getting Ensure at least once a day - po intake poor   For questions or updates, please contact [place] Please consult [email or web address] for contact info under        Signed, Rosaria Ferries, PA-C  07/11/2019, 8:00 AM    Patient seen and examined.  Agree with above documentation.  On exam today, she has elevated JVD, bibasilar crackles, regular rate and rhythm, no murmur.  She reports that she is feeling very weak and having shortness of breath.  Labs notable for hemoglobin 6.8 this morning.  Worsening renal function, up to creatinine 2.9 today.  Given worsening volume overload and renal function, discussed with Dr Carolin Sicks in nephrology and will start diuresis.  Will start IV Lasix 80 mg twice daily.  Will transfuse 1 unit PRBCs due to hemoglobin 6.8.  Suspect anemia of chronic disease, will check iron studies/haptoglobin/reticulocytes/B12/folate.  Once diuresed and patient is able to lie flat, will plan for noncontrast CMR to quantify AI and LV systolic function, can  also evaluate for amyloid with T1 mapping and constriction with real-time imaging (though no evidence of constriction on recent cath).  Donato Heinz, MD

## 2019-07-11 NOTE — Care Management Important Message (Signed)
Important Message  Patient Details  Name: Joanna Ali MRN: [id number] Date of Birth: Dec 02, 1950   Medicare Important Message Given:  Yes     Shelda Altes 07/11/2019, 2:05 PM

## 2019-07-11 NOTE — Plan of Care (Signed)
°  Problem: Coping: °Goal: Level of anxiety will decrease °Outcome: Progressing °  °

## 2019-07-11 NOTE — Progress Notes (Signed)
[facility] PROGRESS NOTE  Assessment/ Plan: Pt is a 68 y.o. yo female  with history of hypertension, former smoker, hepatic steatosis, chronic pericardial effusion, moderate AVR initially admitted on 12/17 for progressive dyspnea, anasarca and fatigue, seen as a consultation for AKI and fluid overload.  #Acute kidney injury, nonoliguric: Likely hemodynamically mediated concomitant with use of IV contrast.  She probably has poor renal perfusion in the setting of pericardial effusion. Urinalysis was negative for protein.  No RBC or WBC in 03/2019.  Autoimmune work-up were unremarkable in the past. US renal: No hydronephrosis.   Marginal urine output and serum creatinine level continued to go up.  She is volume overloaded.  Discussed with the cardiology and starting Lasix 80 mg IV twice daily. Monitor BMP, strict ins and out.  Avoid further IV contrast. No uremia.  No indication for dialysis.  #Hyponatremia, hypervolemic: Urine sodium l< 10.   Starting loop diuretics.  Monitor lab.  #Anasarca presumably due to liver disease which was diagnosed at [facility].  She has a history of liver steatosis. No evidence of GN/nephrotic syndrome. Received 2 doses of IV albumin yesterday.  #Moderate to large pericardial effusion:   Per cardiology team.  #Hypertension: Blood pressure acceptable.  On Coreg.  #Anemia: Receiving a unit of blood transfusion.  Anemia labs sent.  Subjective: Seen and examined at bedside.  Feels weak and short of breath with minimal exertion.  No chest pain.  Urine output is recorded only 550 cc.  Denies nausea vomiting, dysgeusia.  Objective Vital signs in last 24 hours: Vitals:   07/11/19 1210 07/11/19 1215 07/11/19 1236 07/11/19 1237  BP: 120/84 120/84    Pulse: 82     Resp: 20 (!) 22 14 14   Temp: 97.9 F (36.6 C)     TempSrc: Oral     SpO2: 100%   100%  Weight:      Height:       Weight change: 1.8 kg  Intake/Output Summary (Last 24  hours) at 07/11/2019 1343 Last data filed at 07/11/2019 1210 Gross per 24 hour  Intake 715 ml  Output 550 ml  Net 165 ml       Labs: Basic Metabolic Panel: Recent Labs  Lab 07/04/19 1424 07/09/19 0347 07/10/19 0408 07/11/19 0355  NA 130* 128* 127* 125*  K 3.3* 4.0 4.1 4.1  CL 90* 89* 89* 86*  CO2 25 25 23 24   GLUCOSE 147* 101* 94 94  BUN 36* 66* 72* 78*  CREATININE 1.50* 2.35* 2.60* 2.86*  CALCIUM 9.1 9.1 8.9 9.5  PHOS 5.8*  --  5.6* 6.1*   Liver Function Tests: Recent Labs  Lab 07/04/19 1424 07/09/19 0347 07/10/19 0408 07/11/19 0355  AST  --  37  --   --   ALT  --  16  --   --   ALKPHOS  --  57  --   --   BILITOT  --  0.9  --   --   PROT 6.2* 5.5*  --   --   ALBUMIN 2.9* 2.6* 2.5* 3.9   No results for input(s): LIPASE, AMYLASE in the last 168 hours. No results for input(s): AMMONIA in the last 168 hours. CBC: Recent Labs  Lab 07/07/19 1119 07/08/19 0456 07/09/19 0347 07/10/19 0408 07/11/19 0355  WBC 9.6 9.0 10.2 9.1 7.6  HGB 7.5* 7.6* 7.6* 7.6* 6.8*  HCT 24.0* 23.3* 24.2* 23.9* 22.3*  MCV 90.6 87.9 90.0 89.5 91.8  PLT 197 220 251  234 220   Cardiac Enzymes: No results for input(s): CKTOTAL, CKMB, CKMBINDEX, TROPONINI in the last 168 hours. CBG: No results for input(s): GLUCAP in the last 168 hours.  Iron Studies:  Recent Labs    07/11/19 0937  IRON 38  TIBC 252  FERRITIN 283   Studies/Results: US RENAL  Result Date: 07/09/2019 CLINICAL DATA:  Acute renal injury. EXAM: RENAL / URINARY TRACT ULTRASOUND COMPLETE COMPARISON:  None. FINDINGS: Right Kidney: Renal measurements: 10.9 x 3.9 x 5.8 cm = volume: 128.7 mL . Echogenicity within normal limits. No mass or hydronephrosis visualized. Left Kidney: Renal measurements: 10.7 x 4.7 x 5.9 cm = volume: 154.0 mL. Echogenicity within normal limits. No mass or hydronephrosis visualized. Bladder: Appears normal for degree of bladder distention. Other: None. IMPRESSION: Normal study.  No cause for acute  renal injury identified. Electronically Signed   By: Dorise Bullion III M.D   On: 07/09/2019 15:34   ECHOCARDIOGRAM LIMITED  Result Date: 07/09/2019   ECHOCARDIOGRAM LIMITED REPORT   Patient Name:   RIDDHI GRETHER Date of Exam: 07/09/2019 Medical Rec #:  [id number]    Height:       66.0 in Accession #:    [id number]   Weight:       118.6 lb Date of Birth:  1951/07/01    BSA:          1.60 m Patient Age:    39 years     BP:           146/95 mmHg Patient Gender: F            HR:           88 bpm. Exam Location:  Inpatient  Procedure: Limited Echo, Color Doppler and Cardiac Doppler Indications:    I31.3 Pericardial effusion  History:        Patient has prior history of Echocardiogram examinations, most                 recent 07/04/2019.  Sonographer:    Raquel Sarna Senior RDCS Referring Phys: [id number] [place]  1. Left ventricular ejection fraction, by visual estimation, is 45 to 50%. The left ventricle has moderately decreased function. There is no left ventricular hypertrophy.  2. The left ventricle demonstrates global hypokinesis.  3. Global right ventricle was not assessed.The right ventricular size is not well visualized. Right vetricular wall thickness was not assessed.  4. Left atrial size was not assessed.  5. Moderate pericardial effusion.  6. The pericardial effusion is circumferential.  7. The mitral valve is grossly normal. Mild mitral valve regurgitation.  8. The tricuspid valve is grossly normal. Tricuspid valve regurgitation moderate-severe.  9. The tricuspid valve was normal in structure. Tricuspid valve regurgitation moderate-severe. 10. Aortic valve regurgitation is moderate to severe. 11. The aortic valve is tricuspid. Aortic valve regurgitation is moderate to severe. 12. Aortic root could not be assessed. 13. Mildly elevated pulmonary artery systolic pressure. 14. The inferior vena cava is normal in size with <50% respiratory variability, suggesting right atrial pressure of 8 mmHg.  15. Limited echo. Effusion appears similar to just slightly enlarged from prior. Aortic regurgitation has been previously evaluated by TEE; incomplete evaluation on this study but cannot exclude severe AR based on images. 16. The interatrial septum was not assessed. FINDINGS  Left Ventricle: Left ventricular ejection fraction, by visual estimation, is 45 to 50%. The left ventricle has moderately decreased function. The left ventricle demonstrates global hypokinesis. Right Ventricle: The  right ventricular size is not assessed. Right vetricular wall thickness was not assessed. Global RV systolic function is was not assessed. The tricuspid regurgitant velocity is 2.57 m/s, and with an assumed right atrial pressure of 8  mmHg, the estimated right ventricular systolic pressure is mildly elevated at 34.4 mmHg. Left Atrium: Left atrial size was not assessed. Right Atrium: Right atrial size was not assessed. Right atrial pressure is estimated at 8 mmHg. Pericardium: A moderately sized pericardial effusion is present is seen. A moderately sized pericardial effusion is present. The pericardial effusion is circumferential. Tricuspid Valve: The tricuspid valve is normal in structure. Tricuspid valve regurgitation moderate-severe. Aortic Valve: The aortic valve is tricuspid. Aortic valve regurgitation is moderate to severe. Pulmonic Valve: The pulmonic valve was grossly normal. Pulmonic valve regurgitation is mild to moderate by color flow Doppler. Pulmonic regurgitation is mild to moderate by color flow Doppler. Aorta: Aortic root could not be assessed. Venous: The inferior vena cava is normal in size with less than 50% respiratory variability, suggesting right atrial pressure of 8 mmHg. Shunts: The interatrial septum was not assessed.   LV Volumes (MOD)             Normals LV area d, A2C:    23.00 cm LV area d, A4C:    25.40 cm LV area s, A2C:    18.30 cm LV area s, A4C:    17.90 cm LV major d, A2C:   6.58 cm LV major d,  A4C:   6.80 cm LV major s, A2C:   6.41 cm LV major s, A4C:   6.25 cm LV vol d, MOD A2C: 69.4 ml   68 ml LV vol d, MOD A4C: 79.1 ml LV vol s, MOD A2C: 44.5 ml   24 ml LV vol s, MOD A4C: 44.5 ml LV SV MOD A2C:     24.9 ml LV SV MOD A4C:     79.1 ml LV SV MOD BP:      29.9 ml   45 ml RIGHT VENTRICLE RV S prime:     8.92 cm/s TAPSE (M-mode): 1.8 cm TRICUSPID VALVE             Normals TR Peak grad:   26.4 mmHg TR Vmax:        257.00 cm/s 288 cm/s  Buford Dresser MD Electronically signed by Buford Dresser MD Signature Date/Time: 07/09/2019/5:23:10 PM    Final     Medications: Infusions: . sodium chloride 500 mL (07/06/19 1043)  . sodium chloride      Scheduled Medications: . carvedilol  3.125 mg Oral BID WC  . diphenhydrAMINE  25 mg Oral Once  . enoxaparin (LOVENOX) injection  30 mg Subcutaneous Q24H  . feeding supplement (ENSURE ENLIVE)  237 mL Oral TID BM  . furosemide  80 mg Intravenous BID  . gabapentin  300 mg Oral TID  . mouth rinse  15 mL Mouth Rinse BID  . sertraline  25 mg Oral BID  . sodium chloride flush  3 mL Intravenous Q12H  . sodium chloride flush  3 mL Intravenous Q12H    have reviewed scheduled and prn medications.  Physical Exam: General:NAD, comfortable Heart:RRR, s1s2 nl Lungs:clear b/l, no crackle Abdomen:soft, Non-tender, non-distended Extremities: Bilateral lower extremity pitting edema+ Neurology: Alert, awake and following commands.  No asterixis.  Rushton Early Tanna Furry 07/11/2019,1:43 PM  LOS: 7 days  Pager: [phone]

## 2019-07-12 ENCOUNTER — Encounter (HOSPITAL_COMMUNITY): Payer: Self-pay | Admitting: Cardiology

## 2019-07-12 ENCOUNTER — Inpatient Hospital Stay (HOSPITAL_COMMUNITY): Payer: Medicare Other

## 2019-07-12 ENCOUNTER — Encounter (HOSPITAL_COMMUNITY)
Admission: EM | Disposition: A | Discharge status: Rehabilitation Facility | Payer: Self-pay | Source: Ambulatory Visit | Attending: Cardiology

## 2019-07-12 ENCOUNTER — Inpatient Hospital Stay (HOSPITAL_COMMUNITY): Payer: Medicare Other | Admitting: Certified Registered Nurse Anesthetist

## 2019-07-12 DIAGNOSIS — I351 Nonrheumatic aortic (valve) insufficiency: Secondary | ICD-10-CM

## 2019-07-12 DIAGNOSIS — I313 Pericardial effusion (noninflammatory): Secondary | ICD-10-CM

## 2019-07-12 HISTORY — PX: VIDEO ASSISTED THORACOSCOPY: SHX5073

## 2019-07-12 LAB — BASIC METABOLIC PANEL
Anion gap: 17 — ABNORMAL HIGH (ref 5–15)
BUN: 87 mg/dL — ABNORMAL HIGH (ref 8–23)
CO2: 24 mmol/L (ref 22–32)
Calcium: 9.3 mg/dL (ref 8.9–10.3)
Chloride: 87 mmol/L — ABNORMAL LOW (ref 98–111)
Creatinine, Ser: 3.15 mg/dL — ABNORMAL HIGH (ref 0.44–1.00)
GFR calc Af Amer: 17 mL/min — ABNORMAL LOW (ref >60)
GFR calc non Af Amer: 14 mL/min — ABNORMAL LOW (ref >60)
Glucose, Bld: 89 mg/dL (ref 70–99)
Potassium: 3.5 mmol/L (ref 3.5–5.1)
Sodium: 128 mmol/L — ABNORMAL LOW (ref 135–145)

## 2019-07-12 LAB — TYPE AND SCREEN
ABO/RH(D): O POS
Antibody Screen: NEGATIVE
Unit division: 0

## 2019-07-12 LAB — BPAM RBC
Blood Product Expiration Date: 202101202359
ISSUE DATE / TIME: 202012221137
Unit Type and Rh: 5100

## 2019-07-12 LAB — CBC
HCT: 29 % — ABNORMAL LOW (ref 36.0–46.0)
Hemoglobin: 9.4 g/dL — ABNORMAL LOW (ref 12.0–15.0)
MCH: 28.7 pg (ref 26.0–34.0)
MCHC: 32.4 g/dL (ref 30.0–36.0)
MCV: 88.7 fL (ref 80.0–100.0)
Platelets: 238 10*3/uL (ref 150–400)
RBC: 3.27 MIL/uL — ABNORMAL LOW (ref 3.87–5.11)
RDW: 18.1 % — ABNORMAL HIGH (ref 11.5–15.5)
WBC: 7.7 10*3/uL (ref 4.0–10.5)
nRBC: 0 % (ref 0.0–0.2)

## 2019-07-12 LAB — POCT I-STAT, CHEM 8
BUN: 94 mg/dL — ABNORMAL HIGH (ref 8–23)
Calcium, Ion: 1.12 mmol/L — ABNORMAL LOW (ref 1.15–1.40)
Chloride: 85 mmol/L — ABNORMAL LOW (ref 98–111)
Creatinine, Ser: 3.2 mg/dL — ABNORMAL HIGH (ref 0.44–1.00)
Glucose, Bld: 91 mg/dL (ref 70–99)
HCT: 30 % — ABNORMAL LOW (ref 36.0–46.0)
Hemoglobin: 10.2 g/dL — ABNORMAL LOW (ref 12.0–15.0)
Potassium: 3.3 mmol/L — ABNORMAL LOW (ref 3.5–5.1)
Sodium: 127 mmol/L — ABNORMAL LOW (ref 135–145)
TCO2: 28 mmol/L (ref 22–32)

## 2019-07-12 LAB — LIPID PANEL
Cholesterol: 74 mg/dL (ref 0–200)
HDL: 14 mg/dL — ABNORMAL LOW (ref >40)
LDL Cholesterol: 32 mg/dL (ref 0–99)
Total CHOL/HDL Ratio: 5.3 RATIO
Triglycerides: 140 mg/dL (ref <150)
VLDL: 28 mg/dL (ref 0–40)

## 2019-07-12 LAB — PROTIME-INR
INR: 1.2 (ref 0.8–1.2)
Prothrombin Time: 14.8 seconds (ref 11.4–15.2)

## 2019-07-12 LAB — SURGICAL PCR SCREEN
MRSA, PCR: NEGATIVE
Staphylococcus aureus: NEGATIVE

## 2019-07-12 LAB — HAPTOGLOBIN: Haptoglobin: 132 mg/dL (ref 37–355)

## 2019-07-12 SURGERY — VIDEO ASSISTED THORACOSCOPY
Anesthesia: General | Site: Chest | Laterality: Right

## 2019-07-12 MED ORDER — SPIRONOLACTONE 25 MG PO TABS
50.0000 mg | ORAL_TABLET | Freq: Every day | ORAL | Status: DC
  Administered 2019-07-12 – 2019-07-17 (×6): 50 mg via ORAL
  Filled 2019-07-12 (×6): qty 2

## 2019-07-12 MED ORDER — SENNOSIDES-DOCUSATE SODIUM 8.6-50 MG PO TABS
1.0000 | ORAL_TABLET | Freq: Every day | ORAL | Status: DC
  Administered 2019-07-13 – 2019-07-17 (×5): 1 via ORAL
  Filled 2019-07-12 (×6): qty 1

## 2019-07-12 MED ORDER — FENTANYL CITRATE (PF) 250 MCG/5ML IJ SOLN
INTRAMUSCULAR | Status: AC
  Filled 2019-07-12: qty 5

## 2019-07-12 MED ORDER — BUPIVACAINE HCL (PF) 0.5 % IJ SOLN
INTRAMUSCULAR | Status: AC
  Filled 2019-07-12: qty 30

## 2019-07-12 MED ORDER — LACTATED RINGERS IV SOLN: INTRAVENOUS | Status: DC | PRN

## 2019-07-12 MED ORDER — POLYVINYL ALCOHOL 1.4 % OP SOLN
1.0000 [drp] | Freq: Two times a day (BID) | OPHTHALMIC | Status: DC | PRN
  Filled 2019-07-12: qty 15

## 2019-07-12 MED ORDER — FENTANYL CITRATE (PF) 100 MCG/2ML IJ SOLN
INTRAMUSCULAR | Status: DC | PRN
  Administered 2019-07-12: 25 ug via INTRAVENOUS
  Administered 2019-07-12: 50 ug via INTRAVENOUS
  Administered 2019-07-12: 25 ug via INTRAVENOUS

## 2019-07-12 MED ORDER — ACETAMINOPHEN 500 MG PO TABS
1000.0000 mg | ORAL_TABLET | Freq: Four times a day (QID) | ORAL | Status: AC
  Administered 2019-07-13 – 2019-07-17 (×3): 1000 mg via ORAL
  Filled 2019-07-12 (×5): qty 2

## 2019-07-12 MED ORDER — CHLORHEXIDINE GLUCONATE CLOTH 2 % EX PADS
6.0000 | MEDICATED_PAD | Freq: Every day | CUTANEOUS | Status: DC
  Administered 2019-07-12 – 2019-08-01 (×9): 6 via TOPICAL

## 2019-07-12 MED ORDER — BISACODYL 5 MG PO TBEC
10.0000 mg | DELAYED_RELEASE_TABLET | Freq: Every day | ORAL | Status: DC
  Administered 2019-07-13 – 2019-07-21 (×8): 10 mg via ORAL
  Filled 2019-07-12 (×9): qty 2

## 2019-07-12 MED ORDER — SODIUM CHLORIDE 0.9 % IV SOLN: INTRAVENOUS | Status: DC

## 2019-07-12 MED ORDER — ETOMIDATE 2 MG/ML IV SOLN
INTRAVENOUS | Status: DC | PRN
  Administered 2019-07-12: 6 mg via INTRAVENOUS

## 2019-07-12 MED ORDER — FENTANYL CITRATE (PF) 100 MCG/2ML IJ SOLN
INTRAMUSCULAR | Status: AC
  Filled 2019-07-12: qty 2

## 2019-07-12 MED ORDER — ONDANSETRON HCL 4 MG/2ML IJ SOLN
INTRAMUSCULAR | Status: DC | PRN
  Administered 2019-07-12: 4 mg via INTRAVENOUS

## 2019-07-12 MED ORDER — MIDAZOLAM HCL 5 MG/5ML IJ SOLN
INTRAMUSCULAR | Status: DC | PRN
  Administered 2019-07-12: 1 mg via INTRAVENOUS

## 2019-07-12 MED ORDER — GABAPENTIN 100 MG PO CAPS
100.0000 mg | ORAL_CAPSULE | Freq: Three times a day (TID) | ORAL | Status: DC
  Administered 2019-07-13 – 2019-08-03 (×58): 100 mg via ORAL
  Filled 2019-07-12 (×58): qty 1

## 2019-07-12 MED ORDER — TORSEMIDE 20 MG PO TABS
20.0000 mg | ORAL_TABLET | Freq: Every day | ORAL | Status: DC
  Administered 2019-07-12 – 2019-07-13 (×2): 20 mg via ORAL
  Filled 2019-07-12 (×2): qty 1

## 2019-07-12 MED ORDER — BUPIVACAINE LIPOSOME 1.3 % IJ SUSP
20.0000 mL | INTRAMUSCULAR | Status: DC
  Filled 2019-07-12 (×2): qty 20

## 2019-07-12 MED ORDER — FENTANYL CITRATE (PF) 100 MCG/2ML IJ SOLN
25.0000 ug | INTRAMUSCULAR | Status: DC | PRN
  Administered 2019-07-12 (×2): 25 ug via INTRAVENOUS

## 2019-07-12 MED ORDER — ADULT MULTIVITAMIN W/MINERALS CH
1.0000 | ORAL_TABLET | Freq: Every day | ORAL | Status: DC
  Administered 2019-07-13 – 2019-07-17 (×4): 1 via ORAL
  Filled 2019-07-12 (×6): qty 1

## 2019-07-12 MED ORDER — 0.9 % SODIUM CHLORIDE (POUR BTL) OPTIME
TOPICAL | Status: DC | PRN
  Administered 2019-07-12: 2000 mL

## 2019-07-12 MED ORDER — CEFAZOLIN SODIUM-DEXTROSE 1-4 GM/50ML-% IV SOLN
1.0000 g | Freq: Three times a day (TID) | INTRAVENOUS | Status: AC
  Administered 2019-07-12 – 2019-07-13 (×2): 1 g via INTRAVENOUS
  Filled 2019-07-12 (×2): qty 50

## 2019-07-12 MED ORDER — DARBEPOETIN ALFA 100 MCG/0.5ML IJ SOSY
100.0000 ug | PREFILLED_SYRINGE | INTRAMUSCULAR | Status: DC
  Filled 2019-07-12: qty 0.5

## 2019-07-12 MED ORDER — ACETAMINOPHEN 160 MG/5ML PO SOLN
1000.0000 mg | Freq: Four times a day (QID) | ORAL | Status: AC
  Administered 2019-07-16 (×2): 1000 mg via ORAL
  Filled 2019-07-12 (×4): qty 40.6

## 2019-07-12 MED ORDER — OXYCODONE HCL 5 MG PO TABS
5.0000 mg | ORAL_TABLET | ORAL | Status: DC | PRN
  Administered 2019-07-21: 5 mg via ORAL
  Filled 2019-07-12 (×2): qty 1
  Filled 2019-07-12: qty 2

## 2019-07-12 MED ORDER — PROPOFOL 10 MG/ML IV BOLUS
INTRAVENOUS | Status: AC
  Filled 2019-07-12: qty 20

## 2019-07-12 MED ORDER — BUPIVACAINE HCL 0.5 % IJ SOLN
INTRAMUSCULAR | Status: DC | PRN
  Administered 2019-07-12: 30 mL

## 2019-07-12 MED ORDER — PROMETHAZINE HCL 25 MG/ML IJ SOLN: 6.2500 mg | INTRAMUSCULAR | Status: DC | PRN

## 2019-07-12 MED ORDER — ROCURONIUM BROMIDE 50 MG/5ML IV SOSY
PREFILLED_SYRINGE | INTRAVENOUS | Status: DC | PRN
  Administered 2019-07-12: 30 mg via INTRAVENOUS

## 2019-07-12 MED ORDER — SUCCINYLCHOLINE CHLORIDE 200 MG/10ML IV SOSY
PREFILLED_SYRINGE | INTRAVENOUS | Status: DC | PRN
  Administered 2019-07-12: 60 mg via INTRAVENOUS

## 2019-07-12 MED ORDER — SODIUM CHLORIDE 0.9 % IV SOLN
510.0000 mg | Freq: Once | INTRAVENOUS | Status: AC
  Administered 2019-07-12: 510 mg via INTRAVENOUS
  Filled 2019-07-12 (×2): qty 17

## 2019-07-12 MED ORDER — MIDAZOLAM HCL 2 MG/2ML IJ SOLN
INTRAMUSCULAR | Status: AC
  Filled 2019-07-12: qty 2

## 2019-07-12 MED ORDER — PHENYLEPHRINE HCL (PRESSORS) 10 MG/ML IV SOLN
INTRAVENOUS | Status: DC | PRN
  Administered 2019-07-12 (×3): 80 ug via INTRAVENOUS

## 2019-07-12 MED ORDER — PROPYLENE GLYCOL 0.6 % OP SOLN: 1.0000 [drp] | Freq: Two times a day (BID) | OPHTHALMIC | Status: DC | PRN

## 2019-07-12 MED ORDER — CEFAZOLIN SODIUM-DEXTROSE 2-3 GM-%(50ML) IV SOLR
INTRAVENOUS | Status: DC | PRN
  Administered 2019-07-12: 2 g via INTRAVENOUS

## 2019-07-12 MED ORDER — MORPHINE SULFATE (PF) 2 MG/ML IV SOLN
2.0000 mg | INTRAVENOUS | Status: DC | PRN
  Administered 2019-07-12 – 2019-08-01 (×14): 2 mg via INTRAVENOUS
  Filled 2019-07-12 (×16): qty 1

## 2019-07-12 MED ORDER — ETOMIDATE 2 MG/ML IV SOLN
INTRAVENOUS | Status: AC
  Filled 2019-07-12: qty 10

## 2019-07-12 MED ORDER — SUGAMMADEX SODIUM 200 MG/2ML IV SOLN
INTRAVENOUS | Status: DC | PRN
  Administered 2019-07-12: 200 mg via INTRAVENOUS

## 2019-07-12 SURGICAL SUPPLY — 73 items
ADAPTER CARDIO PERF ANTE/RETRO (ADAPTER) IMPLANT
BAG DECANTER FOR FLEXI CONT (MISCELLANEOUS) IMPLANT
BLADE CLIPPER SURG (BLADE) IMPLANT
BLADE STERNUM SYSTEM 6 (BLADE) IMPLANT
CANISTER SUCT 3000ML PPV (MISCELLANEOUS) ×3 IMPLANT
CONN ST 1/4X3/8  BEN (MISCELLANEOUS)
CONN ST 1/4X3/8 BEN (MISCELLANEOUS) IMPLANT
CONNECTOR BLAKE 2:1 CARIO BLK (MISCELLANEOUS) ×3 IMPLANT
CONT SPEC 4OZ CLIKSEAL STRL BL (MISCELLANEOUS) ×3 IMPLANT
COVER WAND RF STERILE (DRAPES) IMPLANT
DEFOGGER SCOPE WARMER CLEARIFY (MISCELLANEOUS) ×3 IMPLANT
DERMABOND ADHESIVE PROPEN (GAUZE/BANDAGES/DRESSINGS) ×2
DERMABOND ADVANCED (GAUZE/BANDAGES/DRESSINGS) ×2
DERMABOND ADVANCED .7 DNX12 (GAUZE/BANDAGES/DRESSINGS) ×1 IMPLANT
DERMABOND ADVANCED .7 DNX6 (GAUZE/BANDAGES/DRESSINGS) ×1 IMPLANT
DRAIN CHANNEL 19F RND (DRAIN) ×6 IMPLANT
DRAIN CHANNEL 28F RND 3/8 FF (WOUND CARE) IMPLANT
DRAIN CONNECTOR BLAKE 1:1 (MISCELLANEOUS) ×3 IMPLANT
DRAPE CARDIOVASCULAR INCISE (DRAPES)
DRAPE SLUSH/WARMER DISC (DRAPES) ×3 IMPLANT
DRAPE SRG 135X102X78XABS (DRAPES) IMPLANT
DRAPE UNIVERSAL PACK (DRAPES) ×3 IMPLANT
DRSG COVADERM 4X14 (GAUZE/BANDAGES/DRESSINGS) IMPLANT
ELECT CAUTERY BLADE 6.4 (BLADE) ×3 IMPLANT
ELECT REM PT RETURN 9FT ADLT (ELECTROSURGICAL) ×3
ELECTRODE REM PT RTRN 9FT ADLT (ELECTROSURGICAL) ×1 IMPLANT
EVACUATOR SILICONE 100CC (DRAIN) ×3 IMPLANT
FELT TEFLON 1X6 (MISCELLANEOUS) IMPLANT
GAUZE SPONGE 4X4 12PLY STRL (GAUZE/BANDAGES/DRESSINGS) ×3 IMPLANT
GAUZE SPONGE 4X4 12PLY STRL LF (GAUZE/BANDAGES/DRESSINGS) ×3 IMPLANT
GLOVE BIO SURGEON STRL SZ 6 (GLOVE) ×3 IMPLANT
GLOVE BIO SURGEON STRL SZ 6.5 (GLOVE) ×2 IMPLANT
GLOVE BIO SURGEON STRL SZ7 (GLOVE) ×6 IMPLANT
GLOVE BIO SURGEON STRL SZ7.5 (GLOVE) IMPLANT
GLOVE BIO SURGEONS STRL SZ 6.5 (GLOVE) ×1
GLOVE BIOGEL PI IND STRL 6 (GLOVE) ×1 IMPLANT
GLOVE BIOGEL PI INDICATOR 6 (GLOVE) ×2
GOWN STRL REUS W/ TWL LRG LVL3 (GOWN DISPOSABLE) ×2 IMPLANT
GOWN STRL REUS W/ TWL XL LVL3 (GOWN DISPOSABLE) ×1 IMPLANT
GOWN STRL REUS W/TWL LRG LVL3 (GOWN DISPOSABLE) ×4
GOWN STRL REUS W/TWL XL LVL3 (GOWN DISPOSABLE) ×2
KIT BASIN OR (CUSTOM PROCEDURE TRAY) ×3 IMPLANT
KIT SUCTION CATH 14FR (SUCTIONS) IMPLANT
KIT TURNOVER KIT B (KITS) ×3 IMPLANT
NEEDLE HYPO 25GX1X1/2 BEV (NEEDLE) ×3 IMPLANT
NS IRRIG 1000ML POUR BTL (IV SOLUTION) ×6 IMPLANT
PACK CHEST (CUSTOM PROCEDURE TRAY) ×3 IMPLANT
PAD ARMBOARD 7.5X6 YLW CONV (MISCELLANEOUS) ×6 IMPLANT
POSITIONER HEAD DONUT 9IN (MISCELLANEOUS) ×3 IMPLANT
SET IRRIG TUBING LAPAROSCOPIC (IRRIGATION / IRRIGATOR) ×3 IMPLANT
SUT BONE WAX W31G (SUTURE) IMPLANT
SUT MNCRL AB 4-0 PS2 18 (SUTURE) IMPLANT
SUT PROLENE 3 0 SH DA (SUTURE) IMPLANT
SUT SILK  1 MH (SUTURE) ×4
SUT SILK 1 MH (SUTURE) ×2 IMPLANT
SUT VIC AB 3-0 SH 18 (SUTURE) IMPLANT
SUT VIC AB 3-0 SH 27 (SUTURE) ×2
SUT VIC AB 3-0 SH 27X BRD (SUTURE) ×1 IMPLANT
SUT VICRYL 0 UR6 27IN ABS (SUTURE) ×3 IMPLANT
SYR CONTROL 10ML LL (SYRINGE) ×3 IMPLANT
SYSTEM SAHARA CHEST DRAIN ATS (WOUND CARE) IMPLANT
TAPE CLOTH SURG 4X10 WHT LF (GAUZE/BANDAGES/DRESSINGS) ×3 IMPLANT
TOWEL GREEN STERILE (TOWEL DISPOSABLE) ×3 IMPLANT
TOWEL GREEN STERILE FF (TOWEL DISPOSABLE) IMPLANT
TRAP SPECIMEN MUCOUS 40CC (MISCELLANEOUS) ×12 IMPLANT
TRAY FOLEY SLVR 16FR TEMP STAT (SET/KITS/TRAYS/PACK) IMPLANT
TROCAR BLADELESS 12MM (ENDOMECHANICALS) ×3 IMPLANT
TROCAR XCEL BLADELESS 5X75MML (TROCAR) ×6 IMPLANT
TROCAR XCEL BLUNT TIP 100MML (ENDOMECHANICALS) IMPLANT
TROCAR XCEL NON-BLD 5MMX100MML (ENDOMECHANICALS) IMPLANT
TUBING LAP HI FLOW INSUFFLATIO (TUBING) ×3 IMPLANT
UNDERPAD 30X30 (UNDERPADS AND DIAPERS) IMPLANT
WATER STERILE IRR 1000ML POUR (IV SOLUTION) ×6 IMPLANT

## 2019-07-12 NOTE — Progress Notes (Signed)
[facility] NEPHROLOGY PROGRESS NOTE  Assessment/ Plan: Pt is a 68 y.o. yo female  with history of hypertension, former smoker, hepatic steatosis with low albumin, chronic pericardial effusion, moderate AVR initially admitted on 12/17 for progressive dyspnea, anasarca and fatigue, seen as a consultation for AKI and fluid overload.  #Acute kidney injury, nonoliguric: Likely hemodynamically mediated concomitant with use of IV contrast- U/A not remarkable.  Autoimmune work-up were unremarkable in the past. US renal: No hydronephrosis.   Marginal urine output though it seems not well recorded -serum creatinine level continued to go up.  She is volume overloaded but difficult to determine severity with associated hypoalbuminemia causing third spacing. Monitor BMP, strict ins and out.  Avoid further IV contrast. No uremia.  still no indication for dialysis. Hope that will plateau and start to improve soon.  Was on 20 of torsemide and 100 of aldactone at home-  Will resume those meds- only 50 of aldactone for now as do not want to drop BP   #Hyponatremia, hypervolemic: improved from yesterday - should improve further with diuretic   #Anasarca presumably due to liver disease which was diagnosed at [facility].  She has a history of liver steatosis. No evidence of GN/nephrotic syndrome.  However, is hypoalbuminemic which is causing her to 3rd space Received 2 doses of IV albumin previously. Resume home doses of diuretics   #Moderate to large pericardial effusion:   Per cardiology team.  #Hypertension: Blood pressure acceptable.  On low dose Coreg.  #Anemia: Receiving a unit of blood transfusion.  Anemia labs sent. Iron low, will give feraheme and one dose of ESA   Subjective: UOP only recorded at 550 but looks like missed a shift,  Weight up 0.4 kg-  Exam better per cards and sodium up.   BUN and crt both up as well unfortunately.  She feels much better, thinks it was the blood     Objective Vital signs in last 24 hours: Vitals:   07/11/19 2200 07/12/19 0013 07/12/19 0604 07/12/19 0905  BP:    (!) 150/95  Pulse:    82  Resp:      Temp: (!) 97.4 F (36.3 C) (!) 97.3 F (36.3 C) (!) 97.5 F (36.4 C)   TempSrc: Oral Oral Oral   SpO2:      Weight:   55.3 kg   Height:       Weight change: 0.4 kg  Intake/Output Summary (Last 24 hours) at 07/12/2019 1123 Last data filed at 07/12/2019 0940 Gross per 24 hour  Intake 1002 ml  Output 400 ml  Net 602 ml       Labs: Basic Metabolic Panel: Recent Labs  Lab 07/10/19 0408 07/11/19 0355 07/12/19 0258  NA 127* 125* 128*  K 4.1 4.1 3.5  CL 89* 86* 87*  CO2 23 24 24   GLUCOSE 94 94 89  BUN 72* 78* 87*  CREATININE 2.60* 2.86* 3.15*  CALCIUM 8.9 9.5 9.3  PHOS 5.6* 6.1*  --    Liver Function Tests: Recent Labs  Lab 07/09/19 0347 07/10/19 0408 07/11/19 0355  AST 37  --   --   ALT 16  --   --   ALKPHOS 57  --   --   BILITOT 0.9  --   --   PROT 5.5*  --   --   ALBUMIN 2.6* 2.5* 3.9   No results for input(s): LIPASE, AMYLASE in the last 168 hours. No results for input(s): AMMONIA in the last  168 hours. CBC: Recent Labs  Lab 07/08/19 0456 07/09/19 0347 07/10/19 0408 07/11/19 0355 07/11/19 1540 07/12/19 0258  WBC 9.0 10.2 9.1 7.6  --  7.7  HGB 7.6* 7.6* 7.6* 6.8* 8.9* 9.4*  HCT 23.3* 24.2* 23.9* 22.3* 28.1* 29.0*  MCV 87.9 90.0 89.5 91.8  --  88.7  PLT 220 251 234 220  --  238   Cardiac Enzymes: No results for input(s): CKTOTAL, CKMB, CKMBINDEX, TROPONINI in the last 168 hours. CBG: No results for input(s): GLUCAP in the last 168 hours.  Iron Studies:  Recent Labs    07/11/19 0937  IRON 38  TIBC 252  FERRITIN 283   Studies/Results: US LIVER DOPPLER  Result Date: 07/12/2019 CLINICAL DATA:  Acute renal injury, history of hepatic steatosis, pericardial effusion EXAM: DUPLEX ULTRASOUND OF LIVER TECHNIQUE: Color and duplex Doppler ultrasound was performed to evaluate the hepatic  in-flow and out-flow vessels. COMPARISON:  CT 04/05/2019 FINDINGS: Liver: Mildly echogenic parenchyma. Normal hepatic contour without nodularity. No focal lesion, mass or intrahepatic biliary ductal dilatation. Main Portal Vein size: 1.2 cm Portal Vein Velocities (all hepatopetal): Main Prox:  35 cm/sec Main Mid: 30 cm/sec Main Dist:  33 cm/sec Right: 29 cm/sec Left: 27 cm/sec Hepatic Vein Velocities (all hepatofugal): Right:  53 cm/sec Middle:  37 cm/sec Left:  38 cm/sec IVC: Present and patent with normal respiratory phasicity. IVC velocity: 102 cm/sec (probably overestimated due to poor angle correction) Hepatic Artery Velocity:  33 cm/sec Splenic Vein Velocity:  28 cm/sec Spleen: 8.1 cm x 11.4 cm x 4.2 cm with a total volume of 201 cm^3 (411 cm^3 is upper limit normal) Portal Vein Occlusion/Thrombus: No Splenic Vein Occlusion/Thrombus: No Ascites: Trace Varices: None identified Bilateral pleural effusions. IMPRESSION: 1. Unremarkable hepatic vascular Doppler evaluation. 2. Bilateral pleural effusions and trace abdominal ascites. Electronically Signed   By: Lucrezia Europe M.D.   On: 07/12/2019 08:56    Medications: Infusions: . sodium chloride 500 mL (07/06/19 1043)  . sodium chloride      Scheduled Medications: . carvedilol  3.125 mg Oral BID WC  . diphenhydrAMINE  25 mg Oral Once  . enoxaparin (LOVENOX) injection  30 mg Subcutaneous Q24H  . feeding supplement (ENSURE ENLIVE)  237 mL Oral TID BM  . gabapentin  300 mg Oral TID  . mouth rinse  15 mL Mouth Rinse BID  . sertraline  25 mg Oral BID  . sodium chloride flush  3 mL Intravenous Q12H  . sodium chloride flush  3 mL Intravenous Q12H    have reviewed scheduled and prn medications.  Physical Exam: General:NAD, comfortable Heart:RRR, s1s2 nl Lungs:clear b/l, no crackle Abdomen:soft, Non-tender, non-distended Extremities: Bilateral lower extremity pitting edema+ to dep areas  Neurology: Alert, awake and following commands.  No  asterixis.  Jarian Longoria A Vikram Tillett 07/12/2019,11:23 AM  LOS: 8 days

## 2019-07-12 NOTE — Plan of Care (Signed)
  Problem: Clinical Measurements: Goal: Will remain free from infection Outcome: Progressing   Problem: Nutrition: Goal: Adequate nutrition will be maintained Outcome: Progressing   Problem: Coping: Goal: Level of anxiety will decrease Outcome: Progressing   Problem: Elimination: Goal: Will not experience complications related to bowel motility Outcome: Progressing Goal: Will not experience complications related to urinary retention Outcome: Progressing   Problem: Pain Managment: Goal: General experience of comfort will improve Outcome: Progressing   Problem: Safety: Goal: Ability to remain free from injury will improve Outcome: Progressing

## 2019-07-12 NOTE — Anesthesia Procedure Notes (Signed)
Arterial Line Insertion Start/End12/23/2020 4:30 PM, 07/12/2019 4:40 PM Performed by: Babs Bertin, CRNA, CRNA  Preanesthetic checklist: patient identified, IV checked, risks and benefits discussed, surgical consent, monitors and equipment checked and pre-op evaluation Lidocaine 1% used for infiltration Left, radial was placed Catheter size: 20 G Hand hygiene performed  and maximum sterile barriers used   Attempts: 3 Procedure performed without using ultrasound guided technique. Ultrasound Notes:anatomy identified Following insertion, dressing applied and Biopatch.

## 2019-07-12 NOTE — Anesthesia Preprocedure Evaluation (Addendum)
Anesthesia Evaluation  Patient identified by MRN, date of birth, ID band Patient awake    Reviewed: Allergy & Precautions, NPO status , Patient's Chart, lab work & pertinent test results  Airway Mallampati: II  TM Distance: >3 FB Neck ROM: Full    Dental  (+) Dental Advisory Given   Pulmonary former smoker,    breath sounds clear to auscultation       Cardiovascular hypertension, Pt. on medications  Rhythm:Regular Rate:Normal     Neuro/Psych negative neurological ROS     GI/Hepatic negative GI ROS, Neg liver ROS,   Endo/Other  negative endocrine ROS  Renal/GU negative Renal ROS     Musculoskeletal   Abdominal   Peds  Hematology negative hematology ROS (+)   Anesthesia Other Findings   Reproductive/Obstetrics                             Lab Results  Component Value Date   WBC 7.7 07/12/2019   HGB 9.4 (L) 07/12/2019   HCT 29.0 (L) 07/12/2019   MCV 88.7 07/12/2019   PLT 238 07/12/2019   Lab Results  Component Value Date   CREATININE 3.15 (H) 07/12/2019   BUN 87 (H) 07/12/2019   NA 128 (L) 07/12/2019   K 3.5 07/12/2019   CL 87 (L) 07/12/2019   CO2 24 07/12/2019    Anesthesia Physical Anesthesia Plan  ASA: III  Anesthesia Plan: General   Post-op Pain Management:    Induction: Intravenous  PONV Risk Score and Plan: 3 and Dexamethasone, Ondansetron and Treatment may vary due to age or medical condition  Airway Management Planned: Double Lumen EBT  Additional Equipment: Arterial line, CVP and Ultrasound Guidance Line Placement  Intra-op Plan:   Post-operative Plan: Extubation in OR  Informed Consent: I have reviewed the patients History and Physical, chart, labs and discussed the procedure including the risks, benefits and alternatives for the proposed anesthesia with the patient or authorized representative who has indicated his/her understanding and acceptance.      Dental advisory given  Plan Discussed with: CRNA  Anesthesia Plan Comments:        Anesthesia Quick Evaluation

## 2019-07-12 NOTE — Consult Note (Signed)
[address]       [place] [address]             [phone]                    Kaysha Nadeau [place] Medical Record #[id number] Date of Birth: Apr 28, 1951  Referring: No ref. provider found Primary Care: Renaldo Reel, DO Primary Cardiologist: Evalina Field, MD  Chief Complaint:    Chief Complaint  Patient presents with  . Shortness of Breath  . Palpitations    History of Present Illness:    Morine Kohlman 68 y.o. female admitted to the cardiology service for work-up pericardial effusion recurrent pleural effusions.  She was originally seen in August of this year where she presented with worsening dyspnea and lower extremity swelling.  She was seen at [facility] where she underwent a liver biopsy which showed mostly fibrosis steatosis.  She was admitted December 14 with the tachypnea and anasarca.  The echo showed moderately large pericardial, moderate aortic insufficiency, but no evidence of tamponade.  She underwent a right heart cath that did not show any pressures consistent with pericardial constriction.  She she underwent an MRI today which also did not show pericardial constriction.  During hospitalization she has also developed acute kidney injury.  CTS has been consulted to assist with management of her pericardial effusion.      Past Medical History:  Diagnosis Date  . Hypertension   . Leg swelling 03/2019  . Short of breath on exertion 03/2019    Past Surgical History:  Procedure Laterality Date  . APPENDECTOMY    . NECK SURGERY    . RIGHT AND LEFT HEART CATH N/A 07/06/2019   Procedure: RIGHT AND LEFT HEART CATH;  Surgeon: Jolaine Artist, MD;  Location: [facility];  Service: Cardiovascular;  Laterality: N/A;  . TEE WITHOUT CARDIOVERSION N/A 03/28/2019   Procedure: TRANSESOPHAGEAL ECHOCARDIOGRAM (TEE);  Surgeon: Geralynn Rile, MD;  Location: [facility];  Service: Cardiology;  Laterality: N/A;    Family History    Problem Relation Age of Onset  . Hypertension Mother      Social History   Tobacco Use  Smoking Status Former Smoker  . Packs/day: 0.50  . Years: 10.00  . Pack years: 5.00  . Types: Cigarettes  . Quit date: 03/20/1979  . Years since quitting: 40.3  Smokeless Tobacco Never Used  Tobacco Comment   pt states she smoked from her early 61s to early 68s.    Social History   Substance and Sexual Activity  Alcohol Use No     No Known Allergies  Current Facility-Administered Medications  Medication Dose Route Frequency Provider Last Rate Last Admin  . 0.9 %  sodium chloride infusion  250 mL Intravenous PRN Jerline Pain, MD 888 mL/hr at 07/06/19 1043 500 mL at 07/06/19 1043  . 0.9 %  sodium chloride infusion  250 mL Intravenous PRN Jerline Pain, MD      . acetaminophen (TYLENOL) tablet 650 mg  650 mg Oral Q4H PRN Jerline Pain, MD      . camphor-menthol [facility]) lotion   Topical PRN Jerline Pain, MD   Given at 07/08/19 1730  . Darbepoetin Alfa (ARANESP) injection 100 mcg  100 mcg Subcutaneous Q Wed-1800 Corliss Parish, MD      . diphenhydrAMINE (BENADRYL) capsule 25 mg  25 mg Oral Once Barrett, Evelene Croon, PA-C      .  enoxaparin (LOVENOX) injection 30 mg  30 mg Subcutaneous Q24H Jerline Pain, MD   30 mg at 07/12/19 0906  . feeding supplement (ENSURE ENLIVE) (ENSURE ENLIVE) liquid 237 mL  237 mL Oral TID BM Jerline Pain, MD   237 mL at 07/12/19 0906  . gabapentin (NEURONTIN) capsule 100 mg  100 mg Oral TID Corliss Parish, MD      . hydrOXYzine (ATARAX/VISTARIL) tablet 12.5 mg  12.5 mg Oral BID PRN Melina Copa N, PA-C   12.5 mg at 07/10/19 1720  . MEDLINE mouth rinse  15 mL Mouth Rinse BID Jerline Pain, MD   15 mL at 07/12/19 0906  . ondansetron (ZOFRAN) injection 4 mg  4 mg Intravenous Q6H PRN Jerline Pain, MD   4 mg at 07/09/19 1813  . sertraline (ZOLOFT) tablet 25 mg  25 mg Oral BID Dorothy Spark, MD   25 mg at 07/12/19 5038  . sodium chloride flush  (NS) 0.9 % injection 3 mL  3 mL Intravenous Q12H Jerline Pain, MD   3 mL at 07/12/19 0907  . sodium chloride flush (NS) 0.9 % injection 3 mL  3 mL Intravenous PRN Candee Furbish C, MD      . sodium chloride flush (NS) 0.9 % injection 3 mL  3 mL Intravenous Q12H Jerline Pain, MD   3 mL at 07/12/19 0907  . sodium chloride flush (NS) 0.9 % injection 3 mL  3 mL Intravenous PRN Jerline Pain, MD      . spironolactone (ALDACTONE) tablet 50 mg  50 mg Oral Daily Corliss Parish, MD   50 mg at 07/12/19 1311  . torsemide (DEMADEX) tablet 20 mg  20 mg Oral Daily Corliss Parish, MD   20 mg at 07/12/19 1311  . traMADol (ULTRAM) tablet 50 mg  50 mg Oral Q6H PRN Jerline Pain, MD   50 mg at 07/12/19 0126    Review of Systems  Constitutional: Positive for malaise/fatigue and weight loss.  Respiratory: Positive for shortness of breath.   Cardiovascular: Positive for chest pain, leg swelling and PND.     PHYSICAL EXAMINATION: BP (!) 150/95   Pulse 82   Temp (!) 97.5 F (36.4 C) (Oral)   Resp 19   Ht 5' 6"  (1.676 m)   Wt 55.3 kg   SpO2 100%   BMI 19.68 kg/m  Physical Exam  Constitutional: She is oriented to person, place, and time.  Frail  HENT:  Head: Normocephalic and atraumatic.  Neck: No tracheal deviation present.  Cardiovascular: Normal rate.  Respiratory: No respiratory distress.  Neurological: She is alert and oriented to person, place, and time.    Diagnostic Studies & Laboratory data:     Recent Radiology Findings:   DG Chest 2 View  Result Date: 07/03/2019 CLINICAL DATA:  Shortness of breath worsening for 1 week, anasarca, liver disease EXAM: CHEST - 2 VIEW COMPARISON:  04/05/2019 FINDINGS: Enlargement of cardiac silhouette. Mediastinal contours and pulmonary vascularity normal. Peribronchial thickening and mild bibasilar atelectasis. No definite acute infiltrate or pneumothorax. Tiny bibasilar effusions blunt the posterior costophrenic angles. Mild scattered  endplate spur formation thoracic spine. IMPRESSION: Bronchitic changes with mild bibasilar atelectasis and tiny pleural effusions. Electronically Signed   By: Lavonia Dana M.D.   On: 07/03/2019 18:06   CT Angio Chest PE W and/or Wo Contrast  Result Date: 07/03/2019 CLINICAL DATA:  PE suspected, high pretest probability. EXAM: CT ANGIOGRAPHY CHEST WITH CONTRAST TECHNIQUE: Multidetector  CT imaging of the chest was performed using the standard protocol during bolus administration of intravenous contrast. Multiplanar CT image reconstructions and MIPs were obtained to evaluate the vascular anatomy. CONTRAST:  10m OMNIPAQUE IOHEXOL 350 MG/ML SOLN COMPARISON:  Chest radiograph 07/03/2019, CT chest 04/05/2019 FINDINGS: Cardiovascular: Satisfactory opacification the pulmonary arteries to the segmental level. No pulmonary artery filling defects are identified. Normal caliber central pulmonary arteries. No elevation of the RV/LV ratio (0.6). Cardiac size at the upper limits of normal. Moderate pericardial effusion. Thoracic aorta is normal caliber. Normal 3 vessel branching of the arch. Minimal atherosclerotic plaque throughout the aorta and branch vessels. Luminal evaluation precluded by suboptimal contrast opacification. Mediastinum/Nodes: Scattered low-attenuation mediastinal and hilar nodes are present. No pathologically enlarged mediastinal, hilar or axillary lymph nodes are seen. Thyroid gland and thoracic inlet are unremarkable. No acute abnormality of the trachea or esophagus. Lungs/Pleura: Multifocal areas of interstitial and airspace opacity with a peripheral predominance seen throughout both lungs. Some interlobular septal thickening is noted in the lung apices. There are small moderate bilateral pleural effusions, right slightly greater than left with extension into the fissures. No pneumothorax. Upper Abdomen: No acute abnormalities present in the visualized portions of the upper abdomen. Musculoskeletal:  Multilevel degenerative changes are present in the imaged portions of the spine. Multilevel flowing anterior osteophytosis, compatible with features of diffuse idiopathic skeletal hyperostosis (DISH). Additional mild degenerative changes in the shoulders. Review of the MIP images confirms the above findings. IMPRESSION: 1. No evidence of pulmonary embolus. Multifocal areas of interstitial and airspace opacity with septal thickening bilateral pleural effusions, and pericardial effusion is worrisome for an acute infection process with superimposed edema/heart failure. 2. Scattered subcentimeter low-attenuation mediastinal and hilar adenopathy, likely reactive. 3.  Aortic Atherosclerosis (ICD10-I70.0). Electronically Signed   By: PLovena LeM.D.   On: 07/03/2019 20:11   NM CARDIAC AMYLOID TUMOR LOC INFLAM SPECT 1 DAY  Result Date: 07/05/2019 CLINICAL DATA:  HEART FAILURE. CONCERN FOR CARDIAC AMYLOIDOSIS.68y.o. female with anasarca thought to be liver related with elevated pulmonary pressures normal EF chronic pericardial effusion pleural effusion negative PE increased dyspnea on exertion unremarkable thyroid function negative proteinuria EXAM: NUCLEAR MEDICINE TUMOR LOCALIZATION. PYP CARDIAC AMYLOIDOSIS SCAN WITH SPECT TECHNIQUE: Following intravenous administration of radiopharmaceutical, anterior planar images of the chest were obtained. Regions of interest were placed on the heart and contralateral chest wall for quantitative assessment. Additional SPECT imaging of the chest was obtained. RADIOPHARMACEUTICALS:  20.7 mCi TECHNETIUM 99 PYROPHOSPHATE FINDINGS: Planar Visual assessment: Anterior planar imaging demonstrates radiotracer uptake within the heart less than uptake within the adjacent ribs (Grade 1). Quantitative assessment : Quantitative assessment of the cardiac uptake compared to the contralateral chest wall is equal to 1.16 (H/CL = 1.1). SPECT assessment: SPECT imaging of the chest demonstrates  trace radiotracer accumulation within the LEFT ventricle. IMPRESSION: Visual and quantitative assessment (grade 1, H/CLL equal 1.16) are NOT suggestive of transthyretin amyloidosis. Electronically Signed   By: SSuzy BouchardM.D.   On: 07/05/2019 17:45   CARDIAC CATHETERIZATION  Result Date: 07/06/2019 Baseline Findings: RA = 7 RV = 42/7 PA = 48/17 (28) PCW = 16 LV = 122/8 Fick cardiac output/index = 5.0/3.2 PVR = 2.4 WU FA sat = 98% PA sat = 62%, 62% Prominent y-descents in RA tracing but no square root sign in ventricular tracings and no RV/LV interdeprendence After 350 cc NS bolus RA = 9 RV = 46/9 PA = 48/24 (33) PCW = 23 LV = 125/15 Prominent y-descents in  RA tracing but no square root sign in ventricular tracings and no RV/LV interdeprendence Assessment: 1. Moderate pericardial effusion 2. Mild PAH 3. 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  D/w Dr. Marlou Porch. If effusion becomes hemodynamically significant in future can consider window. Glori Bickers, MD 11:55 AM   US RENAL  Result Date: 07/09/2019 CLINICAL DATA:  Acute renal injury. EXAM: RENAL / URINARY TRACT ULTRASOUND COMPLETE COMPARISON:  None. FINDINGS: Right Kidney: Renal measurements: 10.9 x 3.9 x 5.8 cm = volume: 128.7 mL . Echogenicity within normal limits. No mass or hydronephrosis visualized. Left Kidney: Renal measurements: 10.7 x 4.7 x 5.9 cm = volume: 154.0 mL. Echogenicity within normal limits. No mass or hydronephrosis visualized. Bladder: Appears normal for degree of bladder distention. Other: None. IMPRESSION: Normal study.  No cause for acute renal injury identified. Electronically Signed   By: Dorise Bullion III M.D   On: 07/09/2019 15:34   DG CHEST PORT 1 VIEW  Result Date: 07/09/2019 CLINICAL DATA:  Pleural effusion EXAM:  PORTABLE CHEST 1 VIEW COMPARISON:  July 06, 2019 FINDINGS: No pneumothorax. No change in cardiomegaly. The hila and mediastinum are stable. Bilateral pulmonary opacities are stable. More focal opacity in the left retrocardiac region with a probable small associated effusion is stable. No other abnormalities. IMPRESSION: 1. No interval change in the small left effusion, focal left retrocardiac opacity, or diffuse interstitial opacities. Electronically Signed   By: Dorise Bullion III M.D   On: 07/09/2019 13:25   DG Chest Port 1 View  Result Date: 07/06/2019 CLINICAL DATA:  Shortness of breath with pleural effusion EXAM: PORTABLE CHEST 1 VIEW COMPARISON:  Yesterday FINDINGS: Improving pulmonary opacification although there is still interstitial and airspace opacity. Cardiopericardial enlargement with pericardial effusion by recent CT. There is also pleural effusions. No pneumothorax IMPRESSION: 1. Improving interstitial and airspace opacity, rapid changes likely related to improving edema. 2. Stable cardiopericardial enlargement. Left more than right pleural effusions. Electronically Signed   By: Monte Fantasia M.D.   On: 07/06/2019 07:50   DG Chest Port 1 View  Result Date: 07/05/2019 CLINICAL DATA:  Shortness of breath, respiratory failure EXAM: PORTABLE CHEST 1 VIEW COMPARISON:  07/04/2019 FINDINGS: Persistent bilateral opacities including dense retrocardiac consolidation. There is worsening lung aeration. Probable small bilateral pleural effusions. Stable cardiomediastinal contours. IMPRESSION: Bilateral opacities with worsening lung aeration since 07/04/2019 Electronically Signed   By: Macy Mis M.D.   On: 07/05/2019 09:12   DG Chest Port 1 View  Result Date: 07/04/2019 CLINICAL DATA:  Pneumothorax, history of hypertension EXAM: PORTABLE CHEST 1 VIEW COMPARISON:  07/04/2019 FINDINGS: Heart size remains enlarged. There is signs of bilateral pleural effusion. No signs of pneumothorax.  Interstitial and airspace opacities throughout both right and left chest persist. Worsening opacification particularly at the left lung base, to a lesser extent on the right. No acute bone finding. IMPRESSION: 1. Increasing basilar opacification may represent enlarging effusions are worsening consolidative changes particularly on the left. 2. Cardiomegaly as before. 3. No signs of pneumothorax. 4. Small bilateral effusions. Electronically Signed   By: Zetta Bills M.D.   On: 07/04/2019 20:39   DG Chest Port 1 View  Result Date: 07/04/2019 CLINICAL DATA:  Status post thoracentesis, bedside EXAM: PORTABLE CHEST 1 VIEW COMPARISON:  CT angiogram chest 07/03/2019, chest radiograph 07/03/2019. FINDINGS: Unchanged cardiomegaly. Interstitial and ill-defined airspace opacities  throughout both lungs have increased from prior chest radiograph 07/03/2019. Persistent although decreased left basilar opacity which may reflect incomplete atelectasis, consolidation and/or minimal residual pleural effusion. A curvilinear vertically oriented line projects over the left hemithorax. This may reflect a prominent skin fold (lung markings are seen distally) or pneumothorax. As per the radiographer, the ordering clinician is aware of this finding at the time of interpretation. No evidence of acute bony abnormality. Overlying cardiac monitoring leads. IMPRESSION: A curvilinear vertically oriented line projects over the left hemithorax, which may reflect a prominent skin fold (lung markings are seen distally) or pneumothorax. Patient repositioning with repeat radiograph recommended. Interstitial and ill-defined airspace opacities throughout both lungs have increased from prior chest radiograph 07/03/2019. Findings may reflect worsening pulmonary edema or infection. Persistent although decreased opacity at the left lung base which may reflect incomplete atelectasis, consolidation and/or minimal residual pleural effusion. Unchanged  cardiomegaly. Electronically Signed   By: Kellie Simmering DO   On: 07/04/2019 14:13   DG Chest Port 1V same Day  Result Date: 07/04/2019 CLINICAL DATA:  Follow-up for possible pneumothorax status post thoracentesis. EXAM: PORTABLE CHEST 1 VIEW COMPARISON:  Chest radiograph performed earlier the same day 07/04/2019, CT angiogram chest 07/03/2019. FINDINGS: Unchanged cardiomegaly. A previously questioned left-sided pneumothorax is not well appreciated on the current study. Persistent mild left basilar opacity which may reflect atelectasis, consolidation and/or small residual pleural effusion. Unchanged bilateral interstitial and ill-defined airspace opacities throughout both lungs. Findings may reflect pulmonary edema or infection. Overlying cardiac monitoring leads. IMPRESSION: A previously questioned left-sided pneumothorax is not well appreciated on the current study and may have reflected a skin fold. 6 hour chest x-ray follow-up is recommended for confirmation. Otherwise, no significant interval change as compared to chest radiograph performed earlier the same day at 1:36 p.m. Electronically Signed   By: Kellie Simmering DO   On: 07/04/2019 15:29   US LIVER DOPPLER  Result Date: 07/12/2019 CLINICAL DATA:  Acute renal injury, history of hepatic steatosis, pericardial effusion EXAM: DUPLEX ULTRASOUND OF LIVER TECHNIQUE: Color and duplex Doppler ultrasound was performed to evaluate the hepatic in-flow and out-flow vessels. COMPARISON:  CT 04/05/2019 FINDINGS: Liver: Mildly echogenic parenchyma. Normal hepatic contour without nodularity. No focal lesion, mass or intrahepatic biliary ductal dilatation. Main Portal Vein size: 1.2 cm Portal Vein Velocities (all hepatopetal): Main Prox:  35 cm/sec Main Mid: 30 cm/sec Main Dist:  33 cm/sec Right: 29 cm/sec Left: 27 cm/sec Hepatic Vein Velocities (all hepatofugal): Right:  53 cm/sec Middle:  37 cm/sec Left:  38 cm/sec IVC: Present and patent with normal respiratory  phasicity. IVC velocity: 102 cm/sec (probably overestimated due to poor angle correction) Hepatic Artery Velocity:  33 cm/sec Splenic Vein Velocity:  28 cm/sec Spleen: 8.1 cm x 11.4 cm x 4.2 cm with a total volume of 201 cm^3 (411 cm^3 is upper limit normal) Portal Vein Occlusion/Thrombus: No Splenic Vein Occlusion/Thrombus: No Ascites: Trace Varices: None identified Bilateral pleural effusions. IMPRESSION: 1. Unremarkable hepatic vascular Doppler evaluation. 2. Bilateral pleural effusions and trace abdominal ascites. Electronically Signed   By: Lucrezia Europe M.D.   On: 07/12/2019 08:56   ECHOCARDIOGRAM LIMITED  Result Date: 07/09/2019   ECHOCARDIOGRAM LIMITED REPORT   Patient Name:   MALAYNA NOORI Date of Exam: 07/09/2019 Medical Rec #:  [id number]    Height:       66.0 in Accession #:    [id number]   Weight:       118.6 lb Date of Birth:  [birth year]  BSA:          1.60 m Patient Age:    63 years     BP:           146/95 mmHg Patient Gender: F            HR:           88 bpm. Exam Location:  Inpatient  Procedure: Limited Echo, Color Doppler and Cardiac Doppler Indications:    I31.3 Pericardial effusion  History:        Patient has prior history of Echocardiogram examinations, most                 recent 07/04/2019.  Sonographer:    Raquel Sarna Senior RDCS Referring Phys: [id number] [place]  1. Left ventricular ejection fraction, by visual estimation, is 45 to 50%. The left ventricle has moderately decreased function. There is no left ventricular hypertrophy.  2. The left ventricle demonstrates global hypokinesis.  3. Global right ventricle was not assessed.The right ventricular size is not well visualized. Right vetricular wall thickness was not assessed.  4. Left atrial size was not assessed.  5. Moderate pericardial effusion.  6. The pericardial effusion is circumferential.  7. The mitral valve is grossly normal. Mild mitral valve regurgitation.  8. The tricuspid valve is grossly normal. Tricuspid  valve regurgitation moderate-severe.  9. The tricuspid valve was normal in structure. Tricuspid valve regurgitation moderate-severe. 10. Aortic valve regurgitation is moderate to severe. 11. The aortic valve is tricuspid. Aortic valve regurgitation is moderate to severe. 12. Aortic root could not be assessed. 13. Mildly elevated pulmonary artery systolic pressure. 14. The inferior vena cava is normal in size with <50% respiratory variability, suggesting right atrial pressure of 8 mmHg. 15. Limited echo. Effusion appears similar to just slightly enlarged from prior. Aortic regurgitation has been previously evaluated by TEE; incomplete evaluation on this study but cannot exclude severe AR based on images. 16. The interatrial septum was not assessed. FINDINGS  Left Ventricle: Left ventricular ejection fraction, by visual estimation, is 45 to 50%. The left ventricle has moderately decreased function. The left ventricle demonstrates global hypokinesis. Right Ventricle: The right ventricular size is not assessed. Right vetricular wall thickness was not assessed. Global RV systolic function is was not assessed. The tricuspid regurgitant velocity is 2.57 m/s, and with an assumed right atrial pressure of 8  mmHg, the estimated right ventricular systolic pressure is mildly elevated at 34.4 mmHg. Left Atrium: Left atrial size was not assessed. Right Atrium: Right atrial size was not assessed. Right atrial pressure is estimated at 8 mmHg. Pericardium: A moderately sized pericardial effusion is present is seen. A moderately sized pericardial effusion is present. The pericardial effusion is circumferential. Tricuspid Valve: The tricuspid valve is normal in structure. Tricuspid valve regurgitation moderate-severe. Aortic Valve: The aortic valve is tricuspid. Aortic valve regurgitation is moderate to severe. Pulmonic Valve: The pulmonic valve was grossly normal. Pulmonic valve regurgitation is mild to moderate by color flow  Doppler. Pulmonic regurgitation is mild to moderate by color flow Doppler. Aorta: Aortic root could not be assessed. Venous: The inferior vena cava is normal in size with less than 50% respiratory variability, suggesting right atrial pressure of 8 mmHg. Shunts: The interatrial septum was not assessed.   LV Volumes (MOD)             Normals LV area d, A2C:    23.00 cm LV area d, A4C:    25.40 cm LV  area s, A2C:    18.30 cm LV area s, A4C:    17.90 cm LV major d, A2C:   6.58 cm LV major d, A4C:   6.80 cm LV major s, A2C:   6.41 cm LV major s, A4C:   6.25 cm LV vol d, MOD A2C: 69.4 ml   68 ml LV vol d, MOD A4C: 79.1 ml LV vol s, MOD A2C: 44.5 ml   24 ml LV vol s, MOD A4C: 44.5 ml LV SV MOD A2C:     24.9 ml LV SV MOD A4C:     79.1 ml LV SV MOD BP:      29.9 ml   45 ml RIGHT VENTRICLE RV S prime:     8.92 cm/s TAPSE (M-mode): 1.8 cm TRICUSPID VALVE             Normals TR Peak grad:   26.4 mmHg TR Vmax:        257.00 cm/s 288 cm/s  Buford Dresser MD Electronically signed by Buford Dresser MD Signature Date/Time: 07/09/2019/5:23:10 PM    Final    ECHOCARDIOGRAM LIMITED  Result Date: 07/04/2019   ECHOCARDIOGRAM LIMITED REPORT   Patient Name:   GABRIAL POPPELL Date of Exam: 07/03/2019 Medical Rec #:  [id number]    Height:       66.0 in Accession #:    [id number]   Weight:       117.0 lb Date of Birth:  08/04/1950    BSA:          1.59 m Patient Age:    34 years     BP:           175/111 mmHg Patient Gender: F            HR:           108 bpm. Exam Location:  Inpatient  Procedure: Limited Echo, Limited Color Doppler and Cardiac Doppler                     STAT ECHO Reported to: Dr. Marletta Lor on 07/03/2019 10:40:00 PM. Indications:     pericardial effusion  History:         Patient has prior history of Echocardiogram examinations, most                  recent 03/28/2019.  Sonographer:     Johny Chess Referring Phys:  [id number] Milus Banister Diagnosing Phys: Sanda Klein MD IMPRESSIONS  1. Mild global left  ventricular hypokinesis with LV EF approximately 50%.  2. The left ventricle has no regional wall motion abnormalities.  3. Left ventricular diastolic parameters are consistent with Grade II diastolic dysfunction (pseudonormalization).  4. Elevated left atrial pressure.  5. Moderate circumferential pericardial effusion.  6. There is inversion of the right atrial wall. No other signs of tamponade are identified.  7. The tricuspid valve is grossly normal. Tricuspid valve regurgitation is moderate.  8. The pulmonic valve was not well visualized. Pulmonic valve regurgitation is not visualized.  9. Severely elevated pulmonary artery systolic pressure. 10. The tricuspid regurgitant velocity is 3.85 m/s, and with an assumed right atrial pressure of 8 mmHg, the estimated right ventricular systolic pressure is severely elevated at 67.3 mmHg. 11. The inferior vena cava is normal in size with <50% respiratory variability, suggesting right atrial pressure of 8 mmHg. 12. Aortic valve regurgitation not evaluated. 13. The aortic valve is grossly normal. Aortic valve regurgitation not evaluated. 14.  Left ventricular ejection fraction, by visual estimation, is 50 to 55%. The left ventricle has low normal function. There is no left ventricular hypertrophy. 15. The mitral valve is grossly normal. Moderate mitral valve regurgitation. 16. Global right ventricle has normal systolic function.The right ventricular size is normal. No increase in right ventricular wall thickness. 62. Compared to 04/05/2019, the left ventricular systolic function has diminished. The pericardial effusion is unchanged in size and there are still only incomplete findings to support tamponade. Mitral and tricuspid insufficiency are worse. The degree of pulmonary hypertension appears similar. 18. The diagnosis of pericardial tamponade has reduced sensitivity in the setting of severe pulmonary hypertension. Invasive hemodynamic assessment may be appropriate.  FINDINGS  Left Ventricle: Left ventricular ejection fraction, by visual estimation, is 50 to 55%. The left ventricle has low normal function. The left ventricle has no regional wall motion abnormalities. The left ventricular internal cavity size was the left ventricle is normal in size. There is no increased left ventricular wall thickness. Left ventricular diastolic parameters are consistent with Grade II diastolic dysfunction (pseudonormalization). Elevated left atrial pressure. Mild global left ventricular hypokinesis with LV EF approximately 50%. Right Ventricle: The right ventricular size is normal. No increase in right ventricular wall thickness. Global RV systolic function is has normal systolic function. The tricuspid regurgitant velocity is 3.85 m/s, and with an assumed right atrial pressure  of 8 mmHg, the estimated right ventricular systolic pressure is severely elevated at 67.3 mmHg. Left Atrium: Left atrial size was normal in size. Right Atrium: Right atrial size was normal in size. Right atrial pressure is estimated at 8 mmHg. Pericardium: A moderately sized pericardial effusion is present. The pericardial effusion is circumferential. There is inversion of the right atrial wall. Mitral Valve: The mitral valve is normal in structure. Moderate mitral valve regurgitation, with centrally-directed jet. Tricuspid Valve: The tricuspid valve is grossly normal. Tricuspid valve regurgitation moderate. Aortic Valve: The aortic valve is grossly normal. The aortic valve is normal in structure. Aortic valve regurgitation not evaluated. Pulmonic Valve: The pulmonic valve was not well visualized. Pulmonic valve regurgitation is not visualized. Aorta: The aortic root is normal in size and structure. Venous: The inferior vena cava is normal in size with less than 50% respiratory variability, suggesting right atrial pressure of 8 mmHg. Additional Comments: The diagnosis of pericardial tamponade has reduced sensitivity in  the setting of severe pulmonary hypertension. Invasive hemodynamic assessment may be appropriate.  LEFT VENTRICLE         Normals PLAX 2D LVIDd:         5.09 cm 3.6 cm LVIDs:         3.56 cm 1.7 cm LV PW:         1.14 cm 1.4 cm LV IVS:        1.02 cm 1.3 cm LV SV:         70 ml   79 ml LV SV Index:   44.77   45 ml/m2  LEFT ATRIUM         Index LA diam:    3.55 cm 2.23 cm/m  TRICUSPID VALVE             Normals TR Peak grad:   59.3 mmHg TR Vmax:        385.00 cm/s 288 cm/s  Dani Gobble Croitoru MD Electronically signed by Sanda Klein MD Signature Date/Time: 07/04/2019/8:55:54 AMThe mitral valve is normal in structure.    Final (Updated)  I have independently reviewed the above radiology studies  and reviewed the findings with the patient.   Recent Lab Findings: Lab Results  Component Value Date   WBC 7.7 07/12/2019   HGB 9.4 (L) 07/12/2019   HCT 29.0 (L) 07/12/2019   PLT 238 07/12/2019   GLUCOSE 89 07/12/2019   CHOL 74 07/12/2019   TRIG 140 07/12/2019   HDL 14 (L) 07/12/2019   LDLCALC 32 07/12/2019   ALT 16 07/09/2019   AST 37 07/09/2019   NA 128 (L) 07/12/2019   K 3.5 07/12/2019   CL 87 (L) 07/12/2019   CREATININE 3.15 (H) 07/12/2019   BUN 87 (H) 07/12/2019   CO2 24 07/12/2019   TSH 4.565 (H) 04/07/2019   INR 1.2 07/12/2019      Assessment / Plan:   68 yo female with pericardial effusion OR today for R VATS, pericardial window      Lajuana Matte 07/12/2019 2:49 PM

## 2019-07-12 NOTE — Anesthesia Procedure Notes (Signed)
Central Venous Catheter Insertion Performed by: Suzette Battiest, MD, anesthesiologist Start/End12/23/2020 4:20 PM, 07/12/2019 4:30 PM Patient location: Pre-op. Preanesthetic checklist: patient identified, IV checked, site marked, risks and benefits discussed, surgical consent, monitors and equipment checked, pre-op evaluation, timeout performed and anesthesia consent Position: Trendelenburg Lidocaine 1% used for infiltration and patient sedated Hand hygiene performed , maximum sterile barriers used  and Seldinger technique used Catheter size: 8 Fr Total catheter length 16. Central line was placed.Double lumen Procedure performed using ultrasound guided technique. Ultrasound Notes:anatomy identified, needle tip was noted to be adjacent to the nerve/plexus identified, no ultrasound evidence of intravascular and/or intraneural injection and image(s) printed for medical record Attempts: 1 Following insertion, dressing applied, line sutured and Biopatch. Post procedure assessment: blood return through all ports  Patient tolerated the procedure well with no immediate complications.

## 2019-07-12 NOTE — Anesthesia Procedure Notes (Signed)
Procedure Name: Intubation Date/Time: 07/12/2019 4:58 PM Performed by: Babs Bertin, CRNA Pre-anesthesia Checklist: Patient identified, Emergency Drugs available, Suction available and Patient being monitored Patient Re-evaluated:Patient Re-evaluated prior to induction Oxygen Delivery Method: Circle System Utilized Preoxygenation: Pre-oxygenation with 100% oxygen Induction Type: IV induction Ventilation: Mask ventilation without difficulty Laryngoscope Size: Glidescope Grade View: Grade I Endobronchial tube: Left and Double lumen EBT and 37 Fr Number of attempts: 1 Airway Equipment and Method: Stylet and Oral airway Placement Confirmation: ETT inserted through vocal cords under direct vision,  positive ETCO2 and breath sounds checked- equal and bilateral Tube secured with: Tape Dental Injury: Teeth and Oropharynx as per pre-operative assessment

## 2019-07-12 NOTE — Transfer of Care (Signed)
Immediate Anesthesia Transfer of Care Note  Patient: Lethia Donlon  Procedure(s) Performed: VIDEO ASSISTED THORACOSCOPY Pericardial window  (Right Chest)  Patient Location: PACU  Anesthesia Type:General  Level of Consciousness: responds to stimulation  Airway & Oxygen Therapy: Patient Spontanous Breathing and Patient connected to face mask oxygen  Post-op Assessment: Report given to RN and Post -op Vital signs reviewed and stable  Post vital signs: Reviewed and stable  Last Vitals:  Vitals Value Taken Time  BP 131/88 07/12/19 1822  Temp    Pulse 68 07/12/19 1824  Resp 17 07/12/19 1824  SpO2 99 % 07/12/19 1824  Vitals shown include unvalidated device data.  Last Pain:  Vitals:   07/12/19 0905  TempSrc:   PainSc: 0-No pain         Complications: No apparent anesthesia complications

## 2019-07-12 NOTE — Op Note (Signed)
      [address]       [place] [address]             [phone]        07/12/2019  Patient:  Joanna Ali Pre-Op Dx: Pericardial effusion   Right pleural effusion   Post-op Dx:  same Procedure: - Right Video assisted thoracoscopy - Pericardial Window - Evacuation of pleural and pericardial effusion   Surgeon and Role:      * Nyaire Denbleyker, Lucile Crater, MD - Primary Anesthesia  general EBL:  Minimal  Blood Administration: none Specimen:  Pleural fluid, pericardial fluid, pericardium  Drains: 19 F Blake chest tube in right chest.  72F Blake in pericardium Counts: correct   Indications: Joanna Ali 68 y.o. female admitted to the cardiology service for work-up pericardial effusion recurrent pleural effusions.  She was originally seen in August of this year where she presented with worsening dyspnea and lower extremity swelling.  She was seen at [facility] where she underwent a liver biopsy which showed mostly fibrosis steatosis.  She was admitted December 14 with the tachypnea and anasarca.  The echo showed moderately large pericardial, moderate aortic insufficiency, but no evidence of tamponade.  She underwent a right heart cath that did not show any pressures consistent with pericardial constriction.  She she underwent an MRI today which also did not show pericardial constriction.  During hospitalization she has also developed acute kidney injury.  CTS has been consulted to assist with management of her pericardial effusion. Findings: Bloody pleural effusion Serous pericardial effusion Normal appearing pericardium   Operative Technique: After the risks, benefits and alternatives were thoroughly discussed, the patient was brought to the operative theatre.  Anesthesia was induced, and the patient was then placed in a lazy lateral decubitus position and was prepped and draped in normal sterile fashion.  An appropriate surgical pause was performed, and pre-operative  antibiotics were dosed accordingly.  We began with 1cm incision in the anterior axillary line at the 4th intercostal space.  A 11m trocar was then introduced.  The pleural cavity was then insufflated with Co2.  The 2 additional trocars were then introduced to triangulate the pericardium.  The pericardial sac was then incised, and a 3X3 cm window was created anterior to the phrenic nerve.  The pericardial fluid was serous.  The pericardium was removed, and 72F blake drain was then passed into the pericardial sac.  We watch the remaining lobes re-expand.  The skin and soft tissue were closed with absorbable suture    The patient tolerated the procedure without any immediate complications, and was transferred to the PACU in stable condition.  Joanna Ali

## 2019-07-12 NOTE — Progress Notes (Signed)
Progress Note  [name] Date of Encounter: 07/12/2019  Primary Cardiologist: Evalina Field, MD   Subjective   Received 1 unit PRBCs yesterday, improvement in Hgb from 6.8 to 9.4 this AM.  Diuresed with IV lasix 80 mgx3 yesterday.  She is documented as being net positive 620cc but no output recorded overnight; she states that her bedside cannister was filled with urine this morning and was disposed of but not documented. Scr bumped from 2.9 to 3.2.  She reports dyspnea is improved this morning.    Inpatient Medications    Scheduled Meds: . carvedilol  3.125 mg Oral BID WC  . diphenhydrAMINE  25 mg Oral Once  . enoxaparin (LOVENOX) injection  30 mg Subcutaneous Q24H  . feeding supplement (ENSURE ENLIVE)  237 mL Oral TID BM  . furosemide  80 mg Intravenous BID  . gabapentin  300 mg Oral TID  . mouth rinse  15 mL Mouth Rinse BID  . sertraline  25 mg Oral BID  . sodium chloride flush  3 mL Intravenous Q12H  . sodium chloride flush  3 mL Intravenous Q12H   Continuous Infusions: . sodium chloride 500 mL (07/06/19 1043)  . sodium chloride     PRN Meds: sodium chloride, sodium chloride, acetaminophen, camphor-menthol, hydrOXYzine, ondansetron (ZOFRAN) IV, sodium chloride flush, sodium chloride flush, traMADol   Vital Signs    Vitals:   07/11/19 1735 07/11/19 2200 07/12/19 0013 07/12/19 0604  BP: (!) 145/95     Pulse: 86     Resp: 19     Temp: (!) 97.4 F (36.3 C) (!) 97.4 F (36.3 C) (!) 97.3 F (36.3 C) (!) 97.5 F (36.4 C)  TempSrc: Tympanic Oral Oral Oral  SpO2: 100%     Weight:    55.3 kg  Height:        Intake/Output Summary (Last 24 hours) at 07/12/2019 1308 Last data filed at 07/11/2019 1700 Gross per 24 hour  Intake 1020 ml  Output 400 ml  Net 620 ml   Last 3 Weights 07/12/2019 07/11/2019 07/10/2019  Weight (lbs) 121 lb 14.6 oz 121 lb 0.5 oz 117 lb 1 oz  Weight (kg) 55.3 kg 54.9 kg 53.1 kg      Telemetry    NSR with rate 80s -  Personally Reviewed  ECG    No new tracings - Personally Reviewed  Physical Exam   General: frail, cachectic, female in no acute distress Head: Eyes PERRLA, Head normocephalic and atraumatic Lungs: rales bases bilaterally to auscultation. Heart: HRRR, S1 S2, without rub or gallop. No murmur.  JVD to just above clavicle while sitting upright Abdomen: Bowel sounds are present, abdomen soft and non-tender without masses or  hernias noted. Msk: weak strength and tone for age. Extremities: No clubbing, cyanosis or edema.    Skin:  No rashes or lesions noted. Neuro: Alert and oriented X 3. Psych:  Good affect, responds appropriately  Labs    High Sensitivity Troponin:   Recent Labs  Lab 07/03/19 1739 07/03/19 1930  TROPONINIHS 149* 163*      Chemistry Recent Labs  Lab 07/09/19 0347 07/10/19 0408 07/11/19 0355 07/12/19 0258  NA 128* 127* 125* 128*  K 4.0 4.1 4.1 3.5  CL 89* 89* 86* 87*  CO2 25 23 24 24   GLUCOSE 101* 94 94 89  BUN 66* 72* 78* 87*  CREATININE 2.35* 2.60* 2.86* 3.15*  CALCIUM 9.1 8.9 9.5 9.3  PROT 5.5*  --   --   --  ALBUMIN 2.6* 2.5* 3.9  --   AST 37  --   --   --   ALT 16  --   --   --   ALKPHOS 57  --   --   --   BILITOT 0.9  --   --   --   GFRNONAA 20* 18* 16* 14*  GFRAA 24* 21* 19* 17*  ANIONGAP 14 15 15  17*     Hematology Recent Labs  Lab 07/10/19 0408 07/11/19 0355 07/11/19 0937 07/11/19 1540 07/12/19 0258  WBC 9.1 7.6  --   --  7.7  RBC 2.67* 2.43* 2.76*  --  3.27*  HGB 7.6* 6.8*  --  8.9* 9.4*  HCT 23.9* 22.3*  --  28.1* 29.0*  MCV 89.5 91.8  --   --  88.7  MCH 28.5 28.0  --   --  28.7  MCHC 31.8 30.5  --   --  32.4  RDW 18.4* 18.7*  --   --  18.1*  PLT 234 220  --   --  238    BNP Recent Labs  Lab 07/09/19 0347  BNP 4,004.9*      Radiology    No results found.  Cardiac Studies   Right and left heart cath 07/06/19:  Findings:  RA = 7  RV = 42/7 PA = 48/17 (28) PCW = 16 LV = 122/8 Fick cardiac  output/index = 5.0/3.2 PVR = 2.4 WU FA sat = 98% PA sat = 62%, 62%  Prominent y-descents in RA tracing but no square root sign in ventricular tracings and no RV/LV interdeprendence  After 350 cc NS bolus  RA = 9 RV = 46/9 PA = 48/24 (33) PCW = 23 LV = 125/15  Prominent y-descents in RA tracing but no square root sign in ventricular tracings and no RV/LV interdeprendence  Assessment: 1. Moderate pericardial effusion 2. Mild PAH 3. 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  D/w Dr. Marlou Porch. If effusion becomes hemodynamically significant in future can consider window.   Patient Profile     68 y.o. female with anasarca thought to be liver related with elevated pulmonary pressures normal EF chronic pericardial effusion pleural effusion negative PE increased dyspnea on exertion unremarkable thyroid function negative proteinuria.  Assessment & Plan    1. Anasarca 2. Dyspnea 3. Bilateral pleural effusions 4. Moderate to large circumferential pericardial effusion  5. Hypertension - anasarca felt 2nd liver dz (dx [facility]), but questionable, no steatosis.  Check liver doppler to evaluate for portal hypertension - ETOH use does not completely explain things - COVID NEG - pleural eff s/p bilateral t-cenetesis w/ 700 ml removed each side - R heart cath w/ no tamponade or constrictive pericarditis, C.O. 5 L/min - no TTR amyloid by PYP scan - EF 45-50% w/ mod effusion, no tamponade - wt trending up, I/O either incomplete or w/ poor po intake - started IV lasix 80 mg BID yesterday.  Worsening renal function today but was not net negative (did receive 1 unit PRBCs, with extra dose of lasix following administration).  Unfortunately UOP was not documented overnight, she reports filling bedside  cannister.  Appears more euvolemic today (JVD decreased from level of jaw to just above clavicle while sitting upright), continues to have bibasilar crackled.  Will discuss with nephrology - possibly worsening  AI on TTE.  Will plan for non-contrast CMR today quantify AI and LV systolic function  6. AKI - Cr continues to rise  - admit BUN/Cr 33/1.43>>>87/3.15 today - Nephrology following, appreciate assistance  7. Anemia of chronic disease - H&H 8.1/25.9 on admission>>6.8/22.3 yesterday.  Given 1 unit PRBCs with improvement to Hgb 9.4 today - Anemia appears consistent with anemia of chronic disease  8. Albuminemia  - albumin 3.1 on admission>>2.5>>3.9 after albumin infusion x 2 - getting Ensure at least once a day - po intake poor   For questions or updates, please contact [facility] Please consult [email or web address] for contact info under        Signed, Donato Heinz, MD  07/12/2019, 8:29 AM

## 2019-07-12 NOTE — Brief Op Note (Signed)
07/03/2019 - 07/12/2019  6:00 PM  PATIENT:  Joanna Ali  68 y.o. female  PRE-OPERATIVE DIAGNOSIS:  pericardial effusion  POST-OPERATIVE DIAGNOSIS:  pericardial effusion  PROCEDURE:  Procedure(s): VIDEO ASSISTED THORACOSCOPY Pericardial window  (Right)  SURGEON:  Surgeon(s) and Role:    * Lightfoot, Lucile Crater, MD - Primary   ANESTHESIA:   general  EBL:  Per anesthesia records   BLOOD ADMINISTERED:none  DRAINS: Right pleural tube and right pericdardial JP drain   SPECIMEN: Pericardium, pericardial fluid  DISPOSITION OF SPECIMEN:  Pathology, lab  COUNTS:  YES  DICTATION: .Dragon Dictation  PLAN OF CARE: Admit to inpatient   PATIENT DISPOSITION:  PACU - hemodynamically stable.   Delay start of Pharmacological VTE agent (>24hrs) due to surgical blood loss or risk of bleeding: yes

## 2019-07-13 ENCOUNTER — Inpatient Hospital Stay (HOSPITAL_COMMUNITY): Payer: Medicare Other

## 2019-07-13 ENCOUNTER — Encounter: Payer: Self-pay | Admitting: *Deleted

## 2019-07-13 DIAGNOSIS — I471 Supraventricular tachycardia: Secondary | ICD-10-CM

## 2019-07-13 LAB — RENAL FUNCTION PANEL
Albumin: 3 g/dL — ABNORMAL LOW (ref 3.5–5.0)
Anion gap: 19 — ABNORMAL HIGH (ref 5–15)
BUN: 87 mg/dL — ABNORMAL HIGH (ref 8–23)
CO2: 24 mmol/L (ref 22–32)
Calcium: 9 mg/dL (ref 8.9–10.3)
Chloride: 89 mmol/L — ABNORMAL LOW (ref 98–111)
Creatinine, Ser: 3.57 mg/dL — ABNORMAL HIGH (ref 0.44–1.00)
GFR calc Af Amer: 14 mL/min — ABNORMAL LOW (ref >60)
GFR calc non Af Amer: 12 mL/min — ABNORMAL LOW (ref >60)
Glucose, Bld: 102 mg/dL — ABNORMAL HIGH (ref 70–99)
Phosphorus: 7.8 mg/dL — ABNORMAL HIGH (ref 2.5–4.6)
Potassium: 3.6 mmol/L (ref 3.5–5.1)
Sodium: 132 mmol/L — ABNORMAL LOW (ref 135–145)

## 2019-07-13 LAB — BASIC METABOLIC PANEL
Anion gap: 18 — ABNORMAL HIGH (ref 5–15)
BUN: 85 mg/dL — ABNORMAL HIGH (ref 8–23)
CO2: 26 mmol/L (ref 22–32)
Calcium: 9 mg/dL (ref 8.9–10.3)
Chloride: 88 mmol/L — ABNORMAL LOW (ref 98–111)
Creatinine, Ser: 3.33 mg/dL — ABNORMAL HIGH (ref 0.44–1.00)
GFR calc Af Amer: 15 mL/min — ABNORMAL LOW (ref >60)
GFR calc non Af Amer: 13 mL/min — ABNORMAL LOW (ref >60)
Glucose, Bld: 97 mg/dL (ref 70–99)
Potassium: 3.3 mmol/L — ABNORMAL LOW (ref 3.5–5.1)
Sodium: 132 mmol/L — ABNORMAL LOW (ref 135–145)

## 2019-07-13 LAB — BLOOD GAS, ARTERIAL
Acid-Base Excess: 1.7 mmol/L (ref 0.0–2.0)
Bicarbonate: 26.4 mmol/L (ref 20.0–28.0)
FIO2: 28
O2 Saturation: 97.9 %
Patient temperature: 37
pCO2 arterial: 46.2 mmHg (ref 32.0–48.0)
pH, Arterial: 7.375 (ref 7.350–7.450)
pO2, Arterial: 104 mmHg (ref 83.0–108.0)

## 2019-07-13 LAB — CBC
HCT: 33.5 % — ABNORMAL LOW (ref 36.0–46.0)
Hemoglobin: 10.4 g/dL — ABNORMAL LOW (ref 12.0–15.0)
MCH: 28.3 pg (ref 26.0–34.0)
MCHC: 31 g/dL (ref 30.0–36.0)
MCV: 91.3 fL (ref 80.0–100.0)
Platelets: 263 10*3/uL (ref 150–400)
RBC: 3.67 MIL/uL — ABNORMAL LOW (ref 3.87–5.11)
RDW: 18.7 % — ABNORMAL HIGH (ref 11.5–15.5)
WBC: 14.9 10*3/uL — ABNORMAL HIGH (ref 4.0–10.5)
nRBC: 0 % (ref 0.0–0.2)

## 2019-07-13 MED ORDER — HEPARIN SODIUM (PORCINE) 5000 UNIT/ML IJ SOLN: 5000.0000 [IU] | Freq: Three times a day (TID) | INTRAMUSCULAR | Status: DC

## 2019-07-13 MED ORDER — POTASSIUM CHLORIDE CRYS ER 20 MEQ PO TBCR: 20.0000 meq | EXTENDED_RELEASE_TABLET | Freq: Once | ORAL | Status: DC

## 2019-07-13 MED ORDER — POTASSIUM CHLORIDE 10 MEQ/50ML IV SOLN
10.0000 meq | INTRAVENOUS | Status: AC
  Administered 2019-07-13 (×6): 10 meq via INTRAVENOUS
  Filled 2019-07-13: qty 50

## 2019-07-13 MED ORDER — HEPARIN SODIUM (PORCINE) 5000 UNIT/ML IJ SOLN
5000.0000 [IU] | Freq: Three times a day (TID) | INTRAMUSCULAR | Status: DC
  Administered 2019-07-14 – 2019-07-26 (×36): 5000 [IU] via SUBCUTANEOUS
  Filled 2019-07-13 (×35): qty 1

## 2019-07-13 MED ORDER — CARVEDILOL 3.125 MG PO TABS
3.1250 mg | ORAL_TABLET | Freq: Two times a day (BID) | ORAL | Status: DC
  Administered 2019-07-13 – 2019-08-11 (×52): 3.125 mg via ORAL
  Filled 2019-07-13 (×55): qty 1

## 2019-07-13 NOTE — Progress Notes (Signed)
Progress Note  [name] Date of Encounter: 07/13/2019  Primary Cardiologist: Evalina Field, MD   Subjective   Underwent pericardial window yesterday with Dr Kipp Brood.  Net negative 2L yesterday, slight worsening of Scr (3.2 to 3.3).  Mildly hypertensive this morning (141/90).  Reports pain at chest tube site this morning  Inpatient Medications    Scheduled Meds: . acetaminophen  1,000 mg Oral Q6H   Or  . acetaminophen (TYLENOL) oral liquid 160 mg/5 mL  1,000 mg Oral Q6H  . bisacodyl  10 mg Oral Daily  . Chlorhexidine Gluconate Cloth  6 each Topical Daily  . darbepoetin (ARANESP) injection - NON-DIALYSIS  100 mcg Subcutaneous Q Wed-1800  . diphenhydrAMINE  25 mg Oral Once  . feeding supplement (ENSURE ENLIVE)  237 mL Oral TID BM  . gabapentin  100 mg Oral TID  . mouth rinse  15 mL Mouth Rinse BID  . multivitamin with minerals  1 tablet Oral Daily  . senna-docusate  1 tablet Oral QHS  . sertraline  25 mg Oral BID  . sodium chloride flush  3 mL Intravenous Q12H  . sodium chloride flush  3 mL Intravenous Q12H  . spironolactone  50 mg Oral Daily  . torsemide  20 mg Oral Daily   Continuous Infusions: . sodium chloride 500 mL (07/06/19 1043)  . sodium chloride     PRN Meds: sodium chloride, sodium chloride, acetaminophen, camphor-menthol, hydrOXYzine, morphine injection, ondansetron (ZOFRAN) IV, oxyCODONE, polyvinyl alcohol, sodium chloride flush, traMADol   Vital Signs    Vitals:   07/13/19 0030 07/13/19 0100 07/13/19 0330 07/13/19 0500  BP: 123/81 131/84 (!) 141/90   Pulse: 81 84 (!) 110   Resp: 16 18 16    Temp:   98.6 F (37 C)   TempSrc:   Oral   SpO2: 100% 99% 100%   Weight:    52.1 kg  Height:        Intake/Output Summary (Last 24 hours) at 07/13/2019 0704 Last data filed at 07/13/2019 0458 Gross per 24 hour  Intake 100 ml  Output 2275 ml  Net -2175 ml   Last 3 Weights 07/13/2019 07/12/2019 07/11/2019  Weight (lbs) 114 lb 13.8 oz  121 lb 14.6 oz 121 lb 0.5 oz  Weight (kg) 52.1 kg 55.3 kg 54.9 kg      Telemetry    Appears to be going in and out of atrial tachycardia.  Sinus rate in the 80s, will go up to 110s when an atrial tachycardia- Personally Reviewed  ECG    07/13/19: NSR then transitions to atrial tachycardia - Personally Reviewed  Physical Exam   General: frail, cachectic, female appears uncomfortable Head: Eyes PERRLA, Head normocephalic and atraumatic Lungs: CTAB, chest tube in place Heart: HRRR, S1 S2, without rub or gallop. No murmur.  RIJ CVC Abdomen:  abdomen soft and non-tender w Msk: weak strength and tone  Extremities: No clubbing, cyanosis or edema.    Skin:  No rashes or lesions noted. Neuro: Alert and oriented X 3. PsychKermit Balo affect, responds appropriately  Labs    High Sensitivity Troponin:   Recent Labs  Lab 07/03/19 1739 07/03/19 1930  TROPONINIHS 149* 163*      Chemistry Recent Labs  Lab 07/09/19 0347 07/10/19 0408 07/11/19 0355 07/12/19 0258 07/12/19 1621 07/13/19 0450  NA 128* 127* 125* 128* 127* 132*  K 4.0 4.1 4.1 3.5 3.3* 3.3*  CL 89* 89* 86* 87* 85* 88*  CO2 25 23  24 24  --  26  GLUCOSE 101* 94 94 89 91 97  BUN 66* 72* 78* 87* 94* 85*  CREATININE 2.35* 2.60* 2.86* 3.15* 3.20* 3.33*  CALCIUM 9.1 8.9 9.5 9.3  --  9.0  PROT 5.5*  --   --   --   --   --   ALBUMIN 2.6* 2.5* 3.9  --   --   --   AST 37  --   --   --   --   --   ALT 16  --   --   --   --   --   ALKPHOS 57  --   --   --   --   --   BILITOT 0.9  --   --   --   --   --   GFRNONAA 20* 18* 16* 14*  --  13*  GFRAA 24* 21* 19* 17*  --  15*  ANIONGAP 14 15 15  17*  --  18*     Hematology Recent Labs  Lab 07/11/19 0355 07/11/19 0937 07/12/19 0258 07/12/19 1621 07/13/19 0450  WBC 7.6  --  7.7  --  14.9*  RBC 2.43* 2.76* 3.27*  --  3.67*  HGB 6.8*  --  9.4* 10.2* 10.4*  HCT 22.3*  --  29.0* 30.0* 33.5*  MCV 91.8  --  88.7  --  91.3  MCH 28.0  --  28.7  --  28.3  MCHC 30.5  --  32.4  --   31.0  RDW 18.7*  --  18.1*  --  18.7*  PLT 220  --  238  --  263    BNP Recent Labs  Lab 07/09/19 0347  BNP 4,004.9*      Radiology    DG Chest Port 1 View  Result Date: 07/12/2019 CLINICAL DATA:  Post pericardial window EXAM: PORTABLE CHEST 1 VIEW COMPARISON:  Radiograph 07/09/2019 FINDINGS: A right IJ catheter terminates at the level of the right atrium. There is slight postoperative mediastinal widening with placement of the mediastinal drain. New right chest tube is placed as well. There is increasing patchy opacity in the aerated portion of the left lung though much of this could be explained by layering pleural effusion. No acute osseous abnormality. Small amount of subcutaneous gas along the right chest wall, likely postprocedural. Additional support devices in telemetry leads overlie the chest. IMPRESSION: Slight postoperative mediastinal widening likely expected in the setting of pericardial window placement with a mediastinal drain in place. Increasing patchy opacity in the left lung may reflect a combination of atelectatic change and distributed pleural fluid. New right chest tube placed as well. Trace right effusion remains. Small amount of subcutaneous gas along the right chest wall is likely postprocedural Status factor positioning of a right IJ catheter. Electronically Signed   By: Lovena Le M.D.   On: 07/12/2019 20:02   US LIVER DOPPLER  Result Date: 07/12/2019 CLINICAL DATA:  Acute renal injury, history of hepatic steatosis, pericardial effusion EXAM: DUPLEX ULTRASOUND OF LIVER TECHNIQUE: Color and duplex Doppler ultrasound was performed to evaluate the hepatic in-flow and out-flow vessels. COMPARISON:  CT 04/05/2019 FINDINGS: Liver: Mildly echogenic parenchyma. Normal hepatic contour without nodularity. No focal lesion, mass or intrahepatic biliary ductal dilatation. Main Portal Vein size: 1.2 cm Portal Vein Velocities (all hepatopetal): Main Prox:  35 cm/sec Main Mid: 30  cm/sec Main Dist:  33 cm/sec Right: 29 cm/sec Left: 27 cm/sec Hepatic Vein Velocities (all  hepatofugal): Right:  53 cm/sec Middle:  37 cm/sec Left:  38 cm/sec IVC: Present and patent with normal respiratory phasicity. IVC velocity: 102 cm/sec (probably overestimated due to poor angle correction) Hepatic Artery Velocity:  33 cm/sec Splenic Vein Velocity:  28 cm/sec Spleen: 8.1 cm x 11.4 cm x 4.2 cm with a total volume of 201 cm^3 (411 cm^3 is upper limit normal) Portal Vein Occlusion/Thrombus: No Splenic Vein Occlusion/Thrombus: No Ascites: Trace Varices: None identified Bilateral pleural effusions. IMPRESSION: 1. Unremarkable hepatic vascular Doppler evaluation. 2. Bilateral pleural effusions and trace abdominal ascites. Electronically Signed   By: Lucrezia Europe M.D.   On: 07/12/2019 08:56    Cardiac Studies   Right and left heart cath 07/06/19:  Findings:  RA = 7  RV = 42/7 PA = 48/17 (28) PCW = 16 LV = 122/8 Fick cardiac output/index = 5.0/3.2 PVR = 2.4 WU FA sat = 98% PA sat = 62%, 62%  Prominent y-descents in RA tracing but no square root sign in ventricular tracings and no RV/LV interdeprendence  After 350 cc NS bolus  RA = 9 RV = 46/9 PA = 48/24 (33) PCW = 23 LV = 125/15  Prominent y-descents in RA tracing but no square root sign in ventricular tracings and no RV/LV interdeprendence  Assessment: 1. Moderate pericardial effusion 2. Mild PAH 3. 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  D/w Dr. Marlou Porch. If effusion becomes hemodynamically significant in future can consider window.   Patient Profile     68 y.o. female with anasarca thought to be liver related with elevated pulmonary pressures normal EF chronic pericardial effusion pleural effusion negative PE increased  dyspnea on exertion unremarkable thyroid function negative proteinuria.  Assessment & Plan    Anasarca: etiology remains unclear.  Likely driven by hypoalbuminemia, which was initially thought to be secondary to liver disease.  However, biopsy at [facility] showed mild steatosis and mild to moderate fibrosis.  Liver Doppler does not show evidence of portal hypertension.  Suspect malnutrition likely contributing to low albumin.  No evidence of nephrotic syndrome or protein-losing enteropathy.  Extensive rheumatogic work-up unremarkable.  Cardiac work-up included RHC/LHC which showed no evidence of constriction, PYP scan without evidence of amyloid - Resumed PO torsemide per nephrology yesterday.  Appears more euvolemic today, will check CVP since has CVC now  Large circumferential pericardial effusion: Has remained hemodynamically stable without evidence of tamponade.  RHC/LHC on 12/17 did not show evidence of constriction.  CMR on 12/23 also did not show any evidence of constriction.  Given worsening effusion and question whether effusion was contributing to progressive renal failure, CT surgery was consulted for pericardial window evaluation.  She underwent successful pericardial window on 12/23 - F/u pathology from pericardial window - CT surgery following, appreciate recs: leaving chest tube and JP drain in for today  Systolic dysfunction: EF 45 to 50% on recent TTE.  Mild global dysfunction.  Started on Coreg 3.125 mg twice daily.  Also on aldactone per nephrology.  Aortic regurgitation: Moderate to severe on recent TTE, but incomplete evaluation.  TEE in September showed moderate AI.  AKI: Creatinine up to 3.3 today (baseline 0.8).  Possibly contrast-induced nephropathy, as had mild AKI on admission on 12/14 and then received contrast for CTA.  -Nephrology following, appreciate recs.  Restarted torsemide and Aldactone  Hyponatremia: improving  with diuresis, sodium 132 today  Anemia: Received 1  unit PRBCs for hemoglobin 6.8 on 12/22.  Anemia work-up consistent with anemia of chronic disease.  Hemoglobin improved, 10.4 this morning  Atrial tachycardia: noted on EKG 12/24.  Sinus rate in 80s, will have runs of AT with rate 110s.  Will restart carvedilol 3.125 mg BID    For questions or updates, please contact [place] Please consult [email or web address] for contact info under        Signed, Donato Heinz, MD  07/13/2019, 7:04 AM

## 2019-07-13 NOTE — Progress Notes (Signed)
[facility] NEPHROLOGY PROGRESS NOTE  Assessment/ Plan: Pt is a 67 y.o. yo female  with history of HTN, former smoker, hepatic steatosis with low albumin, chronic pericardial effusion, moderate AR admitted on 12/17 for progressive dyspnea, anasarca and fatigue, seen as a consultation for AKI and fluid overload.  #Acute kidney injury, nonoliguric: Likely hemodynamically mediated concomitant with use of IV contrast- U/A not remarkable.  Autoimmune work-up unremarkable in the past.  No hydronephrosis but seems to have had BOO at least after procedure-  Now with foley.   better UOP now on just oral torsemide and aldactone per her home regimen - BUN and creatinine pretty stable.  She is volume overloaded but difficult to determine severity with associated hypoalbuminemia causing third spacing. Supportive care and  avoid further IV contrast. No uremia.   no indication for dialysis.  did stay fairly stable from yest.  Was on 20 of torsemide and 100 of aldactone at home-  Have resumed those meds- only 50 of aldactone for now as do not want to drop BP   #Hyponatremia, hypervolemic: improved again from yesterday, near normal - should improve further with diuretic   #Anasarca presumably due to liver disease which was diagnosed at [facility].  She has a history of liver steatosis. No evidence of GN/nephrotic syndrome.  However, is hypoalbuminemic which is causing her to 3rd space Received 2 doses of IV albumin previously. Resumed home doses of diuretics on 12/23  #Moderate to large pericardial effusion:   s/p pericardial window on 12/23  #Hypertension: Blood pressure acceptable.  On low dose Coreg.  #Anemia: s/p transfusion on 12/22.  gave feraheme and one dose of ESA on 12/23- has not been rechecked   Subjective: ended up getting a pericardial window done yesterday - now in stepdown- seemed to have BOO, now with foley and 1600 of UOP acutely-  BUN and crt pretty stable-  Sodium up now to 132-   No low BPs   Objective Vital signs in last 24 hours: Vitals:   07/13/19 0030 07/13/19 0100 07/13/19 0330 07/13/19 0500  BP: 123/81 131/84 (!) 141/90   Pulse: 81 84 (!) 110   Resp: 16 18 16    Temp:   98.6 F (37 C)   TempSrc:   Oral   SpO2: 100% 99% 100%   Weight:    52.1 kg  Height:       Weight change: -3.2 kg  Intake/Output Summary (Last 24 hours) at 07/13/2019 0728 Last data filed at 07/13/2019 0458 Gross per 24 hour  Intake 100 ml  Output 2275 ml  Net -2175 ml       Labs: Basic Metabolic Panel: Recent Labs  Lab 07/10/19 0408 07/11/19 0355 07/12/19 0258 07/12/19 1621 07/13/19 0450  NA 127* 125* 128* 127* 132*  K 4.1 4.1 3.5 3.3* 3.3*  CL 89* 86* 87* 85* 88*  CO2 23 24 24   --  26  GLUCOSE 94 94 89 91 97  BUN 72* 78* 87* 94* 85*  CREATININE 2.60* 2.86* 3.15* 3.20* 3.33*  CALCIUM 8.9 9.5 9.3  --  9.0  PHOS 5.6* 6.1*  --   --   --    Liver Function Tests: Recent Labs  Lab 07/09/19 0347 07/10/19 0408 07/11/19 0355  AST 37  --   --   ALT 16  --   --   ALKPHOS 57  --   --   BILITOT 0.9  --   --   PROT 5.5*  --   --  ALBUMIN 2.6* 2.5* 3.9   No results for input(s): LIPASE, AMYLASE in the last 168 hours. No results for input(s): AMMONIA in the last 168 hours. CBC: Recent Labs  Lab 07/09/19 0347 07/10/19 0408 07/11/19 0355 07/12/19 0258 07/12/19 1621 07/13/19 0450  WBC 10.2 9.1 7.6 7.7  --  14.9*  HGB 7.6* 7.6* 6.8* 9.4* 10.2* 10.4*  HCT 24.2* 23.9* 22.3* 29.0* 30.0* 33.5*  MCV 90.0 89.5 91.8 88.7  --  91.3  PLT 251 234 220 238  --  263   Cardiac Enzymes: No results for input(s): CKTOTAL, CKMB, CKMBINDEX, TROPONINI in the last 168 hours. CBG: No results for input(s): GLUCAP in the last 168 hours.  Iron Studies:  Recent Labs    07/11/19 0937  IRON 38  TIBC 252  FERRITIN 283   Studies/Results: DG Chest Port 1 View  Result Date: 07/12/2019 CLINICAL DATA:  Post pericardial window EXAM: PORTABLE CHEST 1 VIEW COMPARISON:  Radiograph  07/09/2019 FINDINGS: A right IJ catheter terminates at the level of the right atrium. There is slight postoperative mediastinal widening with placement of the mediastinal drain. New right chest tube is placed as well. There is increasing patchy opacity in the aerated portion of the left lung though much of this could be explained by layering pleural effusion. No acute osseous abnormality. Small amount of subcutaneous gas along the right chest wall, likely postprocedural. Additional support devices in telemetry leads overlie the chest. IMPRESSION: Slight postoperative mediastinal widening likely expected in the setting of pericardial window placement with a mediastinal drain in place. Increasing patchy opacity in the left lung may reflect a combination of atelectatic change and distributed pleural fluid. New right chest tube placed as well. Trace right effusion remains. Small amount of subcutaneous gas along the right chest wall is likely postprocedural Status factor positioning of a right IJ catheter. Electronically Signed   By: Lovena Le M.D.   On: 07/12/2019 20:02   US LIVER DOPPLER  Result Date: 07/12/2019 CLINICAL DATA:  Acute renal injury, history of hepatic steatosis, pericardial effusion EXAM: DUPLEX ULTRASOUND OF LIVER TECHNIQUE: Color and duplex Doppler ultrasound was performed to evaluate the hepatic in-flow and out-flow vessels. COMPARISON:  CT 04/05/2019 FINDINGS: Liver: Mildly echogenic parenchyma. Normal hepatic contour without nodularity. No focal lesion, mass or intrahepatic biliary ductal dilatation. Main Portal Vein size: 1.2 cm Portal Vein Velocities (all hepatopetal): Main Prox:  35 cm/sec Main Mid: 30 cm/sec Main Dist:  33 cm/sec Right: 29 cm/sec Left: 27 cm/sec Hepatic Vein Velocities (all hepatofugal): Right:  53 cm/sec Middle:  37 cm/sec Left:  38 cm/sec IVC: Present and patent with normal respiratory phasicity. IVC velocity: 102 cm/sec (probably overestimated due to poor angle  correction) Hepatic Artery Velocity:  33 cm/sec Splenic Vein Velocity:  28 cm/sec Spleen: 8.1 cm x 11.4 cm x 4.2 cm with a total volume of 201 cm^3 (411 cm^3 is upper limit normal) Portal Vein Occlusion/Thrombus: No Splenic Vein Occlusion/Thrombus: No Ascites: Trace Varices: None identified Bilateral pleural effusions. IMPRESSION: 1. Unremarkable hepatic vascular Doppler evaluation. 2. Bilateral pleural effusions and trace abdominal ascites. Electronically Signed   By: Lucrezia Europe M.D.   On: 07/12/2019 08:56    Medications: Infusions: . sodium chloride 500 mL (07/06/19 1043)  . sodium chloride      Scheduled Medications: . acetaminophen  1,000 mg Oral Q6H   Or  . acetaminophen (TYLENOL) oral liquid 160 mg/5 mL  1,000 mg Oral Q6H  . bisacodyl  10 mg Oral Daily  .  carvedilol  3.125 mg Oral BID WC  . Chlorhexidine Gluconate Cloth  6 each Topical Daily  . darbepoetin (ARANESP) injection - NON-DIALYSIS  100 mcg Subcutaneous Q Wed-1800  . diphenhydrAMINE  25 mg Oral Once  . feeding supplement (ENSURE ENLIVE)  237 mL Oral TID BM  . gabapentin  100 mg Oral TID  . mouth rinse  15 mL Mouth Rinse BID  . multivitamin with minerals  1 tablet Oral Daily  . potassium chloride  20 mEq Oral Once  . senna-docusate  1 tablet Oral QHS  . sertraline  25 mg Oral BID  . sodium chloride flush  3 mL Intravenous Q12H  . sodium chloride flush  3 mL Intravenous Q12H  . spironolactone  50 mg Oral Daily  . torsemide  20 mg Oral Daily    have reviewed scheduled and prn medications.  Physical Exam: General:very weak, really unable to speak this AM after pericardial window  Heart:RRR, s1s2 nl Lungs:clear b/l, no crackle Abdomen:soft, Non-tender, non-distended Extremities: Bilateral lower extremity pitting edema+ to dep areas    Najma Bozarth A Devynne Sturdivant 07/13/2019,7:28 AM  LOS: 9 days

## 2019-07-13 NOTE — Progress Notes (Addendum)
      [address]       [place] [address]             [phone]      1 Day Post-Op Procedure(s) (LRB): VIDEO ASSISTED THORACOSCOPY Pericardial window  (Right)   Subjective:  Patient admits to having pain this morning.  Objective: Vital signs in last 24 hours: Temp:  [96.8 F (36 C)-98.6 F (37 C)] 98.6 F (37 C) (12/24 0330) Pulse Rate:  [66-119] 110 (12/24 0330) Cardiac Rhythm: Sinus tachycardia (12/24 0700) Resp:  [11-26] 16 (12/24 0330) BP: (123-150)/(80-95) 141/90 (12/24 0330) SpO2:  [98 %-100 %] 100 % (12/24 0330) Arterial Line BP: (130-153)/(73-91) 138/76 (12/24 0100) Weight:  [52.1 kg] 52.1 kg (12/24 0500)  Intake/Output from previous day: 12/23 0701 - 12/24 0700 In: 583.9 [P.O.:100; I.V.:483.9] Out: 2275 [Urine:1625; Blood:20; Chest Tube:630] Intake/Output this shift: Total I/O In: 232.9 [I.V.:232.9] Out: -   General appearance: cooperative and mild distress Heart: regular rate and rhythm Lungs: diminished breath sounds on left Wound: clean and dry  Lab Results: Recent Labs    07/12/19 0258 07/12/19 1621 07/13/19 0450  WBC 7.7  --  14.9*  HGB 9.4* 10.2* 10.4*  HCT 29.0* 30.0* 33.5*  PLT 238  --  263   BMET:  Recent Labs    07/12/19 0258 07/12/19 1621 07/13/19 0450  NA 128* 127* 132*  K 3.5 3.3* 3.3*  CL 87* 85* 88*  CO2 24  --  26  GLUCOSE 89 91 97  BUN 87* 94* 85*  CREATININE 3.15* 3.20* 3.33*  CALCIUM 9.3  --  9.0    PT/INR:  Recent Labs    07/12/19 0258  LABPROT 14.8  INR 1.2   ABG    Component Value Date/Time   PHART 7.375 07/13/2019 0140   HCO3 26.4 07/13/2019 0140   TCO2 28 07/12/2019 1621   ACIDBASEDEF 3.0 (H) 07/06/2019 1037   O2SAT 97.9 07/13/2019 0140   CBG (last 3)  No results for input(s): GLUCAP in the last 72 hours.  Assessment/Plan: S/P Procedure(s) (LRB): VIDEO ASSISTED THORACOSCOPY Pericardial window  (Right)  1. S/P Pericardial Window, drainage of pleural effusion- Right sided  chest tube with 630 cc output, JP drain has >200 cc since surgery, leave both in place 2. Dispo- leave chest tube and blake drain in place, will d/c arterial line,  care per primary   LOS: 9 days    Ellwood Handler, PA-C 07/13/2019   Remains weak, and short of breath Will remove pericardial drain first once output less that 150.   Will remove pleural drain last once output is less than [place]

## 2019-07-14 DIAGNOSIS — I351 Nonrheumatic aortic (valve) insufficiency: Secondary | ICD-10-CM

## 2019-07-14 LAB — COMPREHENSIVE METABOLIC PANEL
ALT: 11 U/L (ref 0–44)
AST: 32 U/L (ref 15–41)
Albumin: 2.8 g/dL — ABNORMAL LOW (ref 3.5–5.0)
Alkaline Phosphatase: 49 U/L (ref 38–126)
Anion gap: 17 — ABNORMAL HIGH (ref 5–15)
BUN: 96 mg/dL — ABNORMAL HIGH (ref 8–23)
CO2: 25 mmol/L (ref 22–32)
Calcium: 9.1 mg/dL (ref 8.9–10.3)
Chloride: 91 mmol/L — ABNORMAL LOW (ref 98–111)
Creatinine, Ser: 3.98 mg/dL — ABNORMAL HIGH (ref 0.44–1.00)
GFR calc Af Amer: 12 mL/min — ABNORMAL LOW (ref >60)
GFR calc non Af Amer: 11 mL/min — ABNORMAL LOW (ref >60)
Glucose, Bld: 103 mg/dL — ABNORMAL HIGH (ref 70–99)
Potassium: 4.4 mmol/L (ref 3.5–5.1)
Sodium: 133 mmol/L — ABNORMAL LOW (ref 135–145)
Total Bilirubin: 0.6 mg/dL (ref 0.3–1.2)
Total Protein: 5.3 g/dL — ABNORMAL LOW (ref 6.5–8.1)

## 2019-07-14 LAB — CBC
HCT: 32.2 % — ABNORMAL LOW (ref 36.0–46.0)
Hemoglobin: 9.8 g/dL — ABNORMAL LOW (ref 12.0–15.0)
MCH: 28.7 pg (ref 26.0–34.0)
MCHC: 30.4 g/dL (ref 30.0–36.0)
MCV: 94.2 fL (ref 80.0–100.0)
Platelets: 245 10*3/uL (ref 150–400)
RBC: 3.42 MIL/uL — ABNORMAL LOW (ref 3.87–5.11)
RDW: 18.9 % — ABNORMAL HIGH (ref 11.5–15.5)
WBC: 11.1 10*3/uL — ABNORMAL HIGH (ref 4.0–10.5)
nRBC: 0 % (ref 0.0–0.2)

## 2019-07-14 LAB — RENAL FUNCTION PANEL
Albumin: 2.8 g/dL — ABNORMAL LOW (ref 3.5–5.0)
Anion gap: 20 — ABNORMAL HIGH (ref 5–15)
BUN: 100 mg/dL — ABNORMAL HIGH (ref 8–23)
CO2: 18 mmol/L — ABNORMAL LOW (ref 22–32)
Calcium: 9 mg/dL (ref 8.9–10.3)
Chloride: 95 mmol/L — ABNORMAL LOW (ref 98–111)
Creatinine, Ser: 3.87 mg/dL — ABNORMAL HIGH (ref 0.44–1.00)
GFR calc Af Amer: 13 mL/min — ABNORMAL LOW (ref >60)
GFR calc non Af Amer: 11 mL/min — ABNORMAL LOW (ref >60)
Glucose, Bld: 97 mg/dL (ref 70–99)
Phosphorus: 9.1 mg/dL — ABNORMAL HIGH (ref 2.5–4.6)
Potassium: 4.8 mmol/L (ref 3.5–5.1)
Sodium: 133 mmol/L — ABNORMAL LOW (ref 135–145)

## 2019-07-14 LAB — ACID FAST SMEAR (AFB, MYCOBACTERIA): Acid Fast Smear: NEGATIVE

## 2019-07-14 LAB — PHOSPHORUS: Phosphorus: 9 mg/dL — ABNORMAL HIGH (ref 2.5–4.6)

## 2019-07-14 MED ORDER — DARBEPOETIN ALFA 100 MCG/0.5ML IJ SOSY
100.0000 ug | PREFILLED_SYRINGE | INTRAMUSCULAR | Status: DC
  Administered 2019-07-14 – 2019-07-21 (×2): 100 ug via SUBCUTANEOUS
  Filled 2019-07-14 (×3): qty 0.5

## 2019-07-14 MED ORDER — TORSEMIDE 20 MG PO TABS
40.0000 mg | ORAL_TABLET | Freq: Every day | ORAL | Status: DC
  Administered 2019-07-14 – 2019-07-25 (×12): 40 mg via ORAL
  Filled 2019-07-14 (×12): qty 2

## 2019-07-14 NOTE — Progress Notes (Signed)
[facility] NEPHROLOGY PROGRESS NOTE  Assessment/ Plan: Pt is a 68 y.o. yo female  with history of HTN, former smoker, hepatic steatosis with low albumin, chronic pericardial effusion, moderate AR admitted on 12/17 for progressive dyspnea, anasarca and fatigue, seen as a consultation for AKI and fluid overload.  #Acute kidney injury, nonoliguric: Likely hemodynamically mediated concomitant with use of IV contrast- U/A not remarkable.  Autoimmune work-up unremarkable in the past.  No hydronephrosis but seems to have had BOO at least after procedure-  Now with foley.   better UOP yest on just oral torsemide and aldactone per her home regimen but has trailed off today for unclear reasons - BUN and creatinine pretty stable.  She is volume overloaded but difficult to determine severity with associated hypoalbuminemia causing third spacing. Supportive care and  avoid further IV contrast. No uremia.   no indication for dialysis.  did stay fairly stable from yest.  Was on 20 of torsemide and 100 of aldactone at home-  Have resumed those meds- only 50 of aldactone for now as do not want to drop BP.  Will inc torsemide some today- bicarb dropped some, no treatment yet     #Hyponatremia, hypervolemic: stabe from yesterday, near normal - feel is hypervolemic hyponatremia  #Anasarca presumably due to liver disease which was diagnosed at [facility].  She has a history of liver steatosis. No evidence of GN/nephrotic syndrome.  Is hypoalbuminemic which is causing her to 3rd space Received 2 doses of IV albumin previously. Resumed home doses of diuretics on 12/23- will inc today   #Moderate to large pericardial effusion:   s/p pericardial window on 12/23  #Hypertension: Blood pressure acceptable.  On low dose Coreg. No really low BPs recorded   #Anemia: s/p transfusion on 12/22.  gave feraheme and one dose of ESA on 12/23- stable  Subjective:  Continues to look quite weak and in pain but slightly  better than yesterday.  525 of UOP recorded, trailed off overnight - sodium , BUN and crt pretty stable - chest tube and pericardial drain still in place   Objective Vital signs in last 24 hours: Vitals:   07/14/19 0259 07/14/19 0300 07/14/19 0301 07/14/19 0302  BP: 125/87     Pulse: 82     Resp: 12 13 13 19   Temp: 97.8 F (36.6 C)     TempSrc: Oral     SpO2: 100%     Weight:    52.2 kg  Height:       Weight change: 0.1 kg  Intake/Output Summary (Last 24 hours) at 07/14/2019 0718 Last data filed at 07/14/2019 0400 Gross per 24 hour  Intake 655.89 ml  Output 1355 ml  Net -699.11 ml       Labs: Basic Metabolic Panel: Recent Labs  Lab 07/13/19 0927 07/14/19 0444 07/14/19 0551  NA 132* 133* 133*  K 3.6 4.4 4.8  CL 89* 91* 95*  CO2 24 25 18*  GLUCOSE 102* 103* 97  BUN 87* 96* 100*  CREATININE 3.57* 3.98* 3.87*  CALCIUM 9.0 9.1 9.0  PHOS 7.8* 9.0* 9.1*   Liver Function Tests: Recent Labs  Lab 07/09/19 0347 07/13/19 0927 07/14/19 0444 07/14/19 0551  AST 37  --  32  --   ALT 16  --  11  --   ALKPHOS 57  --  49  --   BILITOT 0.9  --  0.6  --   PROT 5.5*  --  5.3*  --  ALBUMIN 2.6* 3.0* 2.8* 2.8*   No results for input(s): LIPASE, AMYLASE in the last 168 hours. No results for input(s): AMMONIA in the last 168 hours. CBC: Recent Labs  Lab 07/10/19 0408 07/11/19 0355 07/12/19 0258 07/12/19 1621 07/13/19 0450 07/14/19 0444  WBC 9.1 7.6 7.7  --  14.9* 11.1*  HGB 7.6* 6.8* 9.4* 10.2* 10.4* 9.8*  HCT 23.9* 22.3* 29.0* 30.0* 33.5* 32.2*  MCV 89.5 91.8 88.7  --  91.3 94.2  PLT 234 220 238  --  263 245   Cardiac Enzymes: No results for input(s): CKTOTAL, CKMB, CKMBINDEX, TROPONINI in the last 168 hours. CBG: No results for input(s): GLUCAP in the last 168 hours.  Iron Studies:  Recent Labs    07/11/19 0937  IRON 38  TIBC 252  FERRITIN 283   Studies/Results: DG CHEST PORT 1 VIEW  Result Date: 07/13/2019 CLINICAL DATA:  Chest tube, chest pain  EXAM: PORTABLE CHEST 1 VIEW COMPARISON:  07/12/2019 FINDINGS: Right chest tube remains in place without pneumothorax. Mediastinal, likely pericardial drain remains in place, unchanged. Cardiomegaly. Airspace disease in the left lower lobe again noted with small left effusion, unchanged. IMPRESSION: No significant change since prior study. Electronically Signed   By: Rolm Baptise M.D.   On: 07/13/2019 09:15   DG Chest Port 1 View  Result Date: 07/12/2019 CLINICAL DATA:  Post pericardial window EXAM: PORTABLE CHEST 1 VIEW COMPARISON:  Radiograph 07/09/2019 FINDINGS: A right IJ catheter terminates at the level of the right atrium. There is slight postoperative mediastinal widening with placement of the mediastinal drain. New right chest tube is placed as well. There is increasing patchy opacity in the aerated portion of the left lung though much of this could be explained by layering pleural effusion. No acute osseous abnormality. Small amount of subcutaneous gas along the right chest wall, likely postprocedural. Additional support devices in telemetry leads overlie the chest. IMPRESSION: Slight postoperative mediastinal widening likely expected in the setting of pericardial window placement with a mediastinal drain in place. Increasing patchy opacity in the left lung may reflect a combination of atelectatic change and distributed pleural fluid. New right chest tube placed as well. Trace right effusion remains. Small amount of subcutaneous gas along the right chest wall is likely postprocedural Status factor positioning of a right IJ catheter. Electronically Signed   By: Lovena Le M.D.   On: 07/12/2019 20:02   US LIVER DOPPLER  Result Date: 07/12/2019 CLINICAL DATA:  Acute renal injury, history of hepatic steatosis, pericardial effusion EXAM: DUPLEX ULTRASOUND OF LIVER TECHNIQUE: Color and duplex Doppler ultrasound was performed to evaluate the hepatic in-flow and out-flow vessels. COMPARISON:  CT  04/05/2019 FINDINGS: Liver: Mildly echogenic parenchyma. Normal hepatic contour without nodularity. No focal lesion, mass or intrahepatic biliary ductal dilatation. Main Portal Vein size: 1.2 cm Portal Vein Velocities (all hepatopetal): Main Prox:  35 cm/sec Main Mid: 30 cm/sec Main Dist:  33 cm/sec Right: 29 cm/sec Left: 27 cm/sec Hepatic Vein Velocities (all hepatofugal): Right:  53 cm/sec Middle:  37 cm/sec Left:  38 cm/sec IVC: Present and patent with normal respiratory phasicity. IVC velocity: 102 cm/sec (probably overestimated due to poor angle correction) Hepatic Artery Velocity:  33 cm/sec Splenic Vein Velocity:  28 cm/sec Spleen: 8.1 cm x 11.4 cm x 4.2 cm with a total volume of 201 cm^3 (411 cm^3 is upper limit normal) Portal Vein Occlusion/Thrombus: No Splenic Vein Occlusion/Thrombus: No Ascites: Trace Varices: None identified Bilateral pleural effusions. IMPRESSION: 1. Unremarkable hepatic vascular  Doppler evaluation. 2. Bilateral pleural effusions and trace abdominal ascites. Electronically Signed   By: Lucrezia Europe M.D.   On: 07/12/2019 08:56    Medications: Infusions: . sodium chloride 500 mL (07/06/19 1043)  . sodium chloride      Scheduled Medications: . acetaminophen  1,000 mg Oral Q6H   Or  . acetaminophen (TYLENOL) oral liquid 160 mg/5 mL  1,000 mg Oral Q6H  . bisacodyl  10 mg Oral Daily  . carvedilol  3.125 mg Oral BID WC  . Chlorhexidine Gluconate Cloth  6 each Topical Daily  . darbepoetin (ARANESP) injection - NON-DIALYSIS  100 mcg Subcutaneous Q Wed-1800  . diphenhydrAMINE  25 mg Oral Once  . feeding supplement (ENSURE ENLIVE)  237 mL Oral TID BM  . gabapentin  100 mg Oral TID  . heparin  5,000 Units Subcutaneous Q8H  . mouth rinse  15 mL Mouth Rinse BID  . multivitamin with minerals  1 tablet Oral Daily  . senna-docusate  1 tablet Oral QHS  . sertraline  25 mg Oral BID  . sodium chloride flush  3 mL Intravenous Q12H  . sodium chloride flush  3 mL Intravenous Q12H  .  spironolactone  50 mg Oral Daily  . torsemide  20 mg Oral Daily    have reviewed scheduled and prn medications.  Physical Exam: General:very weak, slightly stronger than yest- talkative  Heart:RRR, s1s2 nl Lungs:clear b/l, no crackle Abdomen:soft, Non-tender, non-distended Extremities: Bilateral lower extremity pitting edema+ to dep areas    Kariss Longmire A Kaniya Trueheart 07/14/2019,7:18 AM  LOS: 10 days

## 2019-07-14 NOTE — Progress Notes (Signed)
Progress Note  [name] Date of Encounter: 07/14/2019  Primary Cardiologist: Evalina Field, MD   Subjective   Appears to be breathing somewhat better today.  Still has pain with chest tube still in place  Inpatient Medications    Scheduled Meds: . acetaminophen  1,000 mg Oral Q6H   Or  . acetaminophen (TYLENOL) oral liquid 160 mg/5 mL  1,000 mg Oral Q6H  . bisacodyl  10 mg Oral Daily  . carvedilol  3.125 mg Oral BID WC  . Chlorhexidine Gluconate Cloth  6 each Topical Daily  . darbepoetin (ARANESP) injection - NON-DIALYSIS  100 mcg Subcutaneous Q Wed-1800  . diphenhydrAMINE  25 mg Oral Once  . feeding supplement (ENSURE ENLIVE)  237 mL Oral TID BM  . gabapentin  100 mg Oral TID  . heparin  5,000 Units Subcutaneous Q8H  . mouth rinse  15 mL Mouth Rinse BID  . multivitamin with minerals  1 tablet Oral Daily  . senna-docusate  1 tablet Oral QHS  . sertraline  25 mg Oral BID  . sodium chloride flush  3 mL Intravenous Q12H  . sodium chloride flush  3 mL Intravenous Q12H  . spironolactone  50 mg Oral Daily  . torsemide  40 mg Oral Daily   Continuous Infusions: . sodium chloride 500 mL (07/06/19 1043)  . sodium chloride     PRN Meds: sodium chloride, sodium chloride, acetaminophen, camphor-menthol, hydrOXYzine, morphine injection, ondansetron (ZOFRAN) IV, oxyCODONE, polyvinyl alcohol, sodium chloride flush, traMADol   Vital Signs    Vitals:   07/14/19 0259 07/14/19 0300 07/14/19 0301 07/14/19 0302  BP: 125/87     Pulse: 82     Resp: 12 13 13 19   Temp: 97.8 F (36.6 C)     TempSrc: Oral     SpO2: 100%     Weight:    52.2 kg  Height:        Intake/Output Summary (Last 24 hours) at 07/14/2019 0904 Last data filed at 07/14/2019 0400 Gross per 24 hour  Intake 183 ml  Output 1355 ml  Net -1172 ml    I/O since admission: -[place]   07/12/19 0604 07/13/19 0500 07/14/19 0302  Weight: 55.3 kg 52.1 kg 52.2 kg    Telemetry      Sinus the 80s without ectopy, no recurrent episodes of atrial tachycardia- Personally Reviewed  ECG    07/04/19 ECG (independently read by me): ST at 110; low volatge  Physical Exam   BP 125/87 (BP Location: Right Arm)   Pulse 82   Temp 97.8 F (36.6 C) (Oral)   Resp 19   Ht 5' 6"  (1.676 m)   Wt 52.2 kg   SpO2 100%   BMI 18.57 kg/m  General: Thin, frail Skin: normal turgor, no rashes, warm and dry HEENT: Normocephalic, atraumatic. Pupils equal round and reactive to light; sclera anicteric; extraocular muscles intact;  Nose without nasal septal hypertrophy Mouth/Parynx benign; Mallinpatti scale Neck: No JVD, no carotid bruits; normal carotid upstroke Lungs: Right chest tube still in place Chest wall: without tenderness to palpitation Heart: PMI not displaced, RRR, s1 s2 normal, 1/6 systolic murmur, no diastolic murmur, no rubs, gallops, thrills, or heaves Abdomen: soft, nontender; no hepatosplenomehaly, BS+; abdominal aorta nontender and not dilated by palpation. Back: no CVA tenderness Pulses 2+ Musculoskeletal: full range of motion, normal strength, no joint deformities Extremities: No edema,  no clubbing, cyanosis, Homan's sign negative  Neurologic: grossly nonfocal; Cranial  nerves grossly wnl   Labs    Chemistry Recent Labs  Lab 07/09/19 0347 07/13/19 0927 07/14/19 0444 07/14/19 0551  NA 128* 132* 133* 133*  K 4.0 3.6 4.4 4.8  CL 89* 89* 91* 95*  CO2 25 24 25  18*  GLUCOSE 101* 102* 103* 97  BUN 66* 87* 96* 100*  CREATININE 2.35* 3.57* 3.98* 3.87*  CALCIUM 9.1 9.0 9.1 9.0  PROT 5.5*  --  5.3*  --   ALBUMIN 2.6* 3.0* 2.8* 2.8*  AST 37  --  32  --   ALT 16  --  11  --   ALKPHOS 57  --  49  --   BILITOT 0.9  --  0.6  --   GFRNONAA 20* 12* 11* 11*  GFRAA 24* 14* 12* 13*  ANIONGAP 14 19* 17* 20*     Hematology Recent Labs  Lab 07/12/19 0258 07/12/19 1621 07/13/19 0450 07/14/19 0444  WBC 7.7  --  14.9* 11.1*  RBC 3.27*  --  3.67* 3.42*  HGB 9.4*  10.2* 10.4* 9.8*  HCT 29.0* 30.0* 33.5* 32.2*  MCV 88.7  --  91.3 94.2  MCH 28.7  --  28.3 28.7  MCHC 32.4  --  31.0 30.4  RDW 18.1*  --  18.7* 18.9*  PLT 238  --  263 245    Cardiac EnzymesNo results for input(s): TROPONINI in the last 168 hours. No results for input(s): TROPIPOC in the last 168 hours.   BNP Recent Labs  Lab 07/09/19 0347  BNP 4,004.9*     DDimer No results for input(s): DDIMER in the last 168 hours.   Lipid Panel     Component Value Date/Time   CHOL 74 07/12/2019 0258   TRIG 140 07/12/2019 0258   HDL 14 (L) 07/12/2019 0258   CHOLHDL 5.3 07/12/2019 0258   VLDL 28 07/12/2019 0258   LDLCALC 32 07/12/2019 0258     Radiology    DG CHEST PORT 1 VIEW  Result Date: 07/13/2019 CLINICAL DATA:  Chest tube, chest pain EXAM: PORTABLE CHEST 1 VIEW COMPARISON:  07/12/2019 FINDINGS: Right chest tube remains in place without pneumothorax. Mediastinal, likely pericardial drain remains in place, unchanged. Cardiomegaly. Airspace disease in the left lower lobe again noted with small left effusion, unchanged. IMPRESSION: No significant change since prior study. Electronically Signed   By: Rolm Baptise M.D.   On: 07/13/2019 09:15   DG Chest Port 1 View  Result Date: 07/12/2019 CLINICAL DATA:  Post pericardial window EXAM: PORTABLE CHEST 1 VIEW COMPARISON:  Radiograph 07/09/2019 FINDINGS: A right IJ catheter terminates at the level of the right atrium. There is slight postoperative mediastinal widening with placement of the mediastinal drain. New right chest tube is placed as well. There is increasing patchy opacity in the aerated portion of the left lung though much of this could be explained by layering pleural effusion. No acute osseous abnormality. Small amount of subcutaneous gas along the right chest wall, likely postprocedural. Additional support devices in telemetry leads overlie the chest. IMPRESSION: Slight postoperative mediastinal widening likely expected in the  setting of pericardial window placement with a mediastinal drain in place. Increasing patchy opacity in the left lung may reflect a combination of atelectatic change and distributed pleural fluid. New right chest tube placed as well. Trace right effusion remains. Small amount of subcutaneous gas along the right chest wall is likely postprocedural Status factor positioning of a right IJ catheter. Electronically Signed   By: March Rummage  [facility] M.D.   On: 07/12/2019 20:02    Cardiac Studies   Right and left heart cath 07/06/19:  Findings:  RA = 7  RV = 42/7 PA = 48/17 (28) PCW = 16 LV = 122/8 Fick cardiac output/index = 5.0/3.2 PVR = 2.4 WU FA sat = 98% PA sat = 62%, 62%  Prominent y-descents in RA tracing but no square root sign in ventricular tracings and no RV/LV interdeprendence  After 350 cc NS bolus  RA = 9 RV = 46/9 PA = 48/24 (33) PCW = 23 LV = 125/15  Prominent y-descents in RA tracing but no square root sign in ventricular tracings and no RV/LV interdeprendence  Assessment: 1. Moderate pericardial effusion 2. Mild PAH 3. Normal filling pressures with no evidence of tamponade or constrictive physiology before/after fluid loading   ECHO 07/09/2019 IMPRESSIONS  1. Left ventricular ejection fraction, by visual estimation, is 45 to 50%. The left ventricle has moderately decreased function. There is no left ventricular hypertrophy.  2. The left ventricle demonstrates global hypokinesis.  3. Global right ventricle was not assessed.The right ventricular size is not well visualized. Right vetricular wall thickness was not assessed.  4. Left atrial size was not assessed.  5. Moderate pericardial effusion.  6. The pericardial effusion is circumferential.  7. The mitral valve is grossly normal. Mild mitral valve regurgitation.  8. The tricuspid valve is grossly normal. Tricuspid valve regurgitation moderate-severe.  9. The tricuspid valve was normal in structure. Tricuspid  valve regurgitation moderate-severe. 10. Aortic valve regurgitation is moderate to severe. 11. The aortic valve is tricuspid. Aortic valve regurgitation is moderate to severe. 12. Aortic root could not be assessed. 13. Mildly elevated pulmonary artery systolic pressure. 14. The inferior vena cava is normal in size with <50% respiratory variability, suggesting right atrial pressure of 8 mmHg. 15. Limited echo. Effusion appears similar to just slightly enlarged from prior. Aortic regurgitation has been previously evaluated by TEE; incomplete evaluation on this study but cannot exclude severe AR based on images. 16. The interatrial septum was not assessed.  Patient Profile     68 y.o. female with anasarca thought to be liver related with elevated pulmonary pressures normal EF chronic pericardial effusion pleural effusion negative PE increased dyspnea on exertion unremarkable thyroid function negative proteinuria.  Assessment & Plan    1.  Anasarca: Improved, now on spironolactone 50 mg and torsemide 40 mg per nephrology.  Has significant hypoalbuminemia contributing to third spacing.  No evidence for nephrotic proteinuria.  Sense of rheumatologic work-up was unremarkable.  No evidence for GI mediated protein-losing enteropathy.  2.  Large circumferential pericardial effusion: Repeat echo on Monday had shown some increase pericardial effusion size, patient now status post pericardial window Dr. Kipp Brood, drainage still in place; cultures still pending  3.  Acute kidney injury: Creatinine had increased up to a peak of 3.98, today hopefully has begun to turn the corner with creatinine 3.87.  4.  Hyponatremia: Improving with diuresis, sodium today 133  5. Tachycardia: Improved with reinitiation of carvedilol 3.125 mg twice a day.  Avoid hypotension.  Titrate if needed.  6.  Recent echo with EF 45 to 50% and moderate to severe AR: Consider repeat echo Doppler study once drainage tubes removed from  pericardial window procedure to reassess LV function and AR following resolution of previous large circumferential pericardial effusion.  Signed, Troy Sine, MD, [facility] 07/14/2019, 9:04 AM

## 2019-07-14 NOTE — Plan of Care (Signed)

## 2019-07-14 NOTE — Progress Notes (Signed)
[id number]       [place] [address]             [phone]      2 Days Post-Op Procedure(s) (LRB): VIDEO ASSISTED THORACOSCOPY Pericardial window  (Right) Subjective: Feels a little better, less SOB, remains quite weak  Objective: Vital signs in last 24 hours: Temp:  [97.5 F (36.4 C)-98.8 F (37.1 C)] 97.8 F (36.6 C) (12/25 0259) Pulse Rate:  [82-89] 82 (12/25 0259) Cardiac Rhythm: Normal sinus rhythm (12/25 0702) Resp:  [12-20] 19 (12/25 0302) BP: (123-132)/(81-91) 125/87 (12/25 0259) SpO2:  [99 %-100 %] 100 % (12/25 0259) Weight:  [52.2 kg] 52.2 kg (12/25 0302)  Hemodynamic parameters for last 24 hours: CVP:  [8 mmHg] 8 mmHg  Intake/Output from previous day: 12/24 0701 - 12/25 0700 In: 655.9 [P.O.:420; I.V.:235.9] Out: 1355 [Urine:525; Chest Tube:830] Intake/Output this shift: No intake/output data recorded.  General appearance: alert, cooperative, fatigued and no distress Heart: regular rate and rhythm and soft murmur Lungs: coarse BS Abdomen: nontender Extremities: no edema Wound: dressings CDI  Lab Results: Recent Labs    07/13/19 0450 07/14/19 0444  WBC 14.9* 11.1*  HGB 10.4* 9.8*  HCT 33.5* 32.2*  PLT 263 245   BMET:  Recent Labs    07/14/19 0444 07/14/19 0551  NA 133* 133*  K 4.4 4.8  CL 91* 95*  CO2 25 18*  GLUCOSE 103* 97  BUN 96* 100*  CREATININE 3.98* 3.87*  CALCIUM 9.1 9.0    PT/INR:  Recent Labs    07/12/19 0258  LABPROT 14.8  INR 1.2   ABG    Component Value Date/Time   PHART 7.375 07/13/2019 0140   HCO3 26.4 07/13/2019 0140   TCO2 28 07/12/2019 1621   ACIDBASEDEF 3.0 (H) 07/06/2019 1037   O2SAT 97.9 07/13/2019 0140   CBG (last 3)  No results for input(s): GLUCAP in the last 72 hours.  Meds Scheduled Meds: . acetaminophen  1,000 mg Oral Q6H   Or  . acetaminophen (TYLENOL) oral liquid 160 mg/5 mL  1,000 mg Oral Q6H  . bisacodyl  10 mg Oral Daily  . carvedilol  3.125 mg Oral BID WC  .  Chlorhexidine Gluconate Cloth  6 each Topical Daily  . darbepoetin (ARANESP) injection - NON-DIALYSIS  100 mcg Subcutaneous Q Wed-1800  . diphenhydrAMINE  25 mg Oral Once  . feeding supplement (ENSURE ENLIVE)  237 mL Oral TID BM  . gabapentin  100 mg Oral TID  . heparin  5,000 Units Subcutaneous Q8H  . mouth rinse  15 mL Mouth Rinse BID  . multivitamin with minerals  1 tablet Oral Daily  . senna-docusate  1 tablet Oral QHS  . sertraline  25 mg Oral BID  . sodium chloride flush  3 mL Intravenous Q12H  . sodium chloride flush  3 mL Intravenous Q12H  . spironolactone  50 mg Oral Daily  . torsemide  40 mg Oral Daily   Continuous Infusions: . sodium chloride 500 mL (07/06/19 1043)  . sodium chloride     PRN Meds:.sodium chloride, sodium chloride, acetaminophen, camphor-menthol, hydrOXYzine, morphine injection, ondansetron (ZOFRAN) IV, oxyCODONE, polyvinyl alcohol, sodium chloride flush, traMADol  Xrays DG CHEST PORT 1 VIEW  Result Date: 07/13/2019 CLINICAL DATA:  Chest tube, chest pain EXAM: PORTABLE CHEST 1 VIEW COMPARISON:  07/12/2019 FINDINGS: Right chest tube remains in place without pneumothorax. Mediastinal, likely pericardial drain remains in place, unchanged. Cardiomegaly. Airspace disease in the  left lower lobe again noted with small left effusion, unchanged. IMPRESSION: No significant change since prior study. Electronically Signed   By: Rolm Baptise M.D.   On: 07/13/2019 09:15   DG Chest Port 1 View  Result Date: 07/12/2019 CLINICAL DATA:  Post pericardial window EXAM: PORTABLE CHEST 1 VIEW COMPARISON:  Radiograph 07/09/2019 FINDINGS: A right IJ catheter terminates at the level of the right atrium. There is slight postoperative mediastinal widening with placement of the mediastinal drain. New right chest tube is placed as well. There is increasing patchy opacity in the aerated portion of the left lung though much of this could be explained by layering pleural effusion. No acute  osseous abnormality. Small amount of subcutaneous gas along the right chest wall, likely postprocedural. Additional support devices in telemetry leads overlie the chest. IMPRESSION: Slight postoperative mediastinal widening likely expected in the setting of pericardial window placement with a mediastinal drain in place. Increasing patchy opacity in the left lung may reflect a combination of atelectatic change and distributed pleural fluid. New right chest tube placed as well. Trace right effusion remains. Small amount of subcutaneous gas along the right chest wall is likely postprocedural Status factor positioning of a right IJ catheter. Electronically Signed   By: Lovena Le M.D.   On: 07/12/2019 20:02    Assessment/Plan: S/P Procedure(s) (LRB): VIDEO ASSISTED THORACOSCOPY Pericardial window  (Right)  1 hemodyn stable in sinus rhythm 2 no new CXR 3 JP drain 135 cc yesterday- non so far today- will d/c     Pleural tube 695 cc yesterday- leave in place 4 creat decreasing trend, nephrology following 5 leukocytosis trend improving 6 H/H fairly stable 7 BS well controlled 8 other management per cardiology/primary   LOS: 10 days    Joanna Ali [facility] 07/14/2019  Pager [phone]- not for patient use

## 2019-07-15 ENCOUNTER — Inpatient Hospital Stay (HOSPITAL_COMMUNITY): Payer: Medicare Other

## 2019-07-15 LAB — RENAL FUNCTION PANEL
Albumin: 2.6 g/dL — ABNORMAL LOW (ref 3.5–5.0)
Anion gap: 15 (ref 5–15)
BUN: 105 mg/dL — ABNORMAL HIGH (ref 8–23)
CO2: 26 mmol/L (ref 22–32)
Calcium: 8.8 mg/dL — ABNORMAL LOW (ref 8.9–10.3)
Chloride: 92 mmol/L — ABNORMAL LOW (ref 98–111)
Creatinine, Ser: 4.15 mg/dL — ABNORMAL HIGH (ref 0.44–1.00)
GFR calc Af Amer: 12 mL/min — ABNORMAL LOW (ref >60)
GFR calc non Af Amer: 10 mL/min — ABNORMAL LOW (ref >60)
Glucose, Bld: 98 mg/dL (ref 70–99)
Phosphorus: 8 mg/dL — ABNORMAL HIGH (ref 2.5–4.6)
Potassium: 4.5 mmol/L (ref 3.5–5.1)
Sodium: 133 mmol/L — ABNORMAL LOW (ref 135–145)

## 2019-07-15 NOTE — Plan of Care (Signed)

## 2019-07-15 NOTE — Progress Notes (Signed)
Progress Note  [name] Date of Encounter: 07/15/2019  Primary Cardiologist:   Evalina Field, MD   Subjective   No chest pain.  Nauseated and vomiting.    Inpatient Medications    Scheduled Meds: . acetaminophen  1,000 mg Oral Q6H   Or  . acetaminophen (TYLENOL) oral liquid 160 mg/5 mL  1,000 mg Oral Q6H  . bisacodyl  10 mg Oral Daily  . carvedilol  3.125 mg Oral BID WC  . Chlorhexidine Gluconate Cloth  6 each Topical Daily  . darbepoetin (ARANESP) injection - NON-DIALYSIS  100 mcg Subcutaneous Q Fri-1800  . diphenhydrAMINE  25 mg Oral Once  . feeding supplement (ENSURE ENLIVE)  237 mL Oral TID BM  . gabapentin  100 mg Oral TID  . heparin  5,000 Units Subcutaneous Q8H  . mouth rinse  15 mL Mouth Rinse BID  . multivitamin with minerals  1 tablet Oral Daily  . senna-docusate  1 tablet Oral QHS  . sertraline  25 mg Oral BID  . sodium chloride flush  3 mL Intravenous Q12H  . sodium chloride flush  3 mL Intravenous Q12H  . spironolactone  50 mg Oral Daily  . torsemide  40 mg Oral Daily   Continuous Infusions: . sodium chloride 500 mL (07/06/19 1043)  . sodium chloride     PRN Meds: sodium chloride, sodium chloride, acetaminophen, camphor-menthol, hydrOXYzine, morphine injection, ondansetron (ZOFRAN) IV, oxyCODONE, polyvinyl alcohol, sodium chloride flush, traMADol   Vital Signs    Vitals:   07/15/19 0338 07/15/19 0500 07/15/19 0738 07/15/19 1106  BP: 130/84  133/89 124/83  Pulse: 75  76 (!) 25  Resp: 12  (!) 23 (!) 21  Temp: 97.6 F (36.4 C)  97.6 F (36.4 C) 97.8 F (36.6 C)  TempSrc: Oral  Oral Oral  SpO2: 100%  97% 98%  Weight:  57.1 kg    Height:        Intake/Output Summary (Last 24 hours) at 07/15/2019 1115 Last data filed at 07/15/2019 1019 Gross per 24 hour  Intake 671 ml  Output 1460 ml  Net -789 ml   Filed Weights   07/13/19 0500 07/14/19 0302 07/15/19 0500  Weight: 52.1 kg 52.2 kg 57.1 kg    Telemetry    NSR -  Personally Reviewed  ECG    NA - Personally Reviewed  Physical Exam   GEN: No acute distress.   Neck: No  JVD Cardiac: RRR, no murmurs, rubs, or gallops.  Respiratory: Clear  to auscultation bilaterally. GI: Soft, nontender, non-distended  MS:    Mild leg edema; No deformity. Neuro:  Nonfocal  Psych: Normal affect   Labs    Chemistry Recent Labs  Lab 07/09/19 0347 07/14/19 0444 07/14/19 0551 07/15/19 0344  NA 128* 133* 133* 133*  K 4.0 4.4 4.8 4.5  CL 89* 91* 95* 92*  CO2 25 25 18* 26  GLUCOSE 101* 103* 97 98  BUN 66* 96* 100* 105*  CREATININE 2.35* 3.98* 3.87* 4.15*  CALCIUM 9.1 9.1 9.0 8.8*  PROT 5.5* 5.3*  --   --   ALBUMIN 2.6* 2.8* 2.8* 2.6*  AST 37 32  --   --   ALT 16 11  --   --   ALKPHOS 57 49  --   --   BILITOT 0.9 0.6  --   --   GFRNONAA 20* 11* 11* 10*  GFRAA 24* 12* 13* 12*  ANIONGAP 14 17* 20* 15  Hematology Recent Labs  Lab 07/12/19 0258 07/12/19 1621 07/13/19 0450 07/14/19 0444  WBC 7.7  --  14.9* 11.1*  RBC 3.27*  --  3.67* 3.42*  HGB 9.4* 10.2* 10.4* 9.8*  HCT 29.0* 30.0* 33.5* 32.2*  MCV 88.7  --  91.3 94.2  MCH 28.7  --  28.3 28.7  MCHC 32.4  --  31.0 30.4  RDW 18.1*  --  18.7* 18.9*  PLT 238  --  263 245    Cardiac EnzymesNo results for input(s): TROPONINI in the last 168 hours. No results for input(s): TROPIPOC in the last 168 hours.   BNP Recent Labs  Lab 07/09/19 0347  BNP 4,004.9*     DDimer No results for input(s): DDIMER in the last 168 hours.   Radiology    DG CHEST PORT 1 VIEW  Result Date: 07/15/2019 CLINICAL DATA:  Post surgery. EXAM: PORTABLE CHEST 1 VIEW COMPARISON:  07/13/2019 FINDINGS: Right-sided chest tube unchanged. Right IJ central venous catheter unchanged with tip over the SVC. Lungs are adequately inflated demonstrate persistent hazy opacification over the left mid to lower lung and right base as findings may be due to infection. Moderate size left effusion and small right effusion are present.  Small right apical pneumothorax which was not visualized on the prior exam. Stable cardiomegaly. Remainder of the exam is unchanged. IMPRESSION: 1. Stable hazy opacification over the left mid to lower lung and stable opacification over the right base as findings may be due to infection. Moderate size left effusion and small right effusion. 2. Small right apical pneumothorax with right-sided chest tube in place. Right IJ central venous catheter unchanged. These results will be called to the ordering clinician or representative by the Radiologist Assistant, and communication documented in the PACS or zVision Dashboard. Electronically Signed   By: Marin Olp M.D.   On: 07/15/2019 08:44    Cardiac Studies   Echo:  1. Left ventricular ejection fraction, by visual estimation, is 45 to 50%. The left ventricle has moderately decreased function. There is no left ventricular hypertrophy. 2. The left ventricle demonstrates global hypokinesis. 3. Global right ventricle was not assessed.The right ventricular size is not well visualized. Right vetricular wall thickness was not assessed. 4. Left atrial size was not assessed. 5. Moderate pericardial effusion. 6. The pericardial effusion is circumferential. 7. The mitral valve is grossly normal. Mild mitral valve regurgitation. 8. The tricuspid valve is grossly normal. Tricuspid valve regurgitation moderate-severe. 9. The tricuspid valve was normal in structure. Tricuspid valve regurgitation moderate-severe. 10. Aortic valve regurgitation is moderate to severe. 11. The aortic valve is tricuspid. Aortic valve regurgitation is moderate to severe. 12. Aortic root could not be assessed. 13. Mildly elevated pulmonary artery systolic pressure. 14. The inferior vena cava is normal in size with <50% respiratory variability, suggesting right atrial pressure of 8 mmHg. 15. Limited echo. Effusion appears similar to just slightly enlarged from prior.  Aortic regurgitation has been previously evaluated by TEE; incomplete evaluation on this study but cannot exclude severe AR based on images. 16. The interatrial septum was not assessed.  Patient Profile     68 y.o. female with anasarca thought to be liver related with elevated pulmonary pressures normal EF chronic pericardial effusion pleural effusion negative PE increased dyspnea on exertion unremarkable thyroid function negative proteinuria.  Now status post pericardial window.    Assessment & Plan    ANASARCA:   Good urine output.    PERICARDIAL EFFUSION:  Status post window 12/23.  Chest tubes out possibly tomorrow.     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  Will follow echo at a later date.   We will follow for timing of this.  Net negative 4.7.      For questions or updates, please contact [place] Please consult [email or web address] for contact info under Cardiology/STEMI.   Signed, Minus Breeding, MD  07/15/2019, 11:15 AM

## 2019-07-15 NOTE — Progress Notes (Signed)
[address]       [place] [address]             [phone]      3 Days Post-Op Procedure(s) (LRB): VIDEO ASSISTED THORACOSCOPY Pericardial window  (Right) Subjective: She feels better, remains quite weak but able to get into chair with assistance  Objective: Vital signs in last 24 hours: Temp:  [97.6 F (36.4 C)-98.1 F (36.7 C)] 97.6 F (36.4 C) (12/26 0738) Pulse Rate:  [75-80] 76 (12/26 0738) Cardiac Rhythm: Normal sinus rhythm (12/26 0800) Resp:  [12-23] 23 (12/26 0738) BP: (117-133)/(81-96) 133/89 (12/26 0738) SpO2:  [97 %-100 %] 97 % (12/26 0738) Weight:  [57.1 kg] 57.1 kg (12/26 0500)  Hemodynamic parameters for last 24 hours:    Intake/Output from previous day: 12/25 0701 - 12/26 0700 In: 725 [P.O.:720; I.V.:5] Out: 1180 [Urine:1050; Chest Tube:130] Intake/Output this shift: Total I/O In: -  Out: 350 [Urine:350]  General appearance: alert, cooperative, fatigued and no distress Heart: regular rate and rhythm Lungs: somewhat coarse throughout Abdomen: soft, nontender Extremities: trace edema Wound: incis heincis ok  Lab Results: Recent Labs    07/13/19 0450 07/14/19 0444  WBC 14.9* 11.1*  HGB 10.4* 9.8*  HCT 33.5* 32.2*  PLT 263 245   BMET:  Recent Labs    07/14/19 0551 07/15/19 0344  NA 133* 133*  K 4.8 4.5  CL 95* 92*  CO2 18* 26  GLUCOSE 97 98  BUN 100* 105*  CREATININE 3.87* 4.15*  CALCIUM 9.0 8.8*    PT/INR: No results for input(s): LABPROT, INR in the last 72 hours. ABG    Component Value Date/Time   PHART 7.375 07/13/2019 0140   HCO3 26.4 07/13/2019 0140   TCO2 28 07/12/2019 1621   ACIDBASEDEF 3.0 (H) 07/06/2019 1037   O2SAT 97.9 07/13/2019 0140   CBG (last 3)  No results for input(s): GLUCAP in the last 72 hours.  Meds Scheduled Meds: . acetaminophen  1,000 mg Oral Q6H   Or  . acetaminophen (TYLENOL) oral liquid 160 mg/5 mL  1,000 mg Oral Q6H  . bisacodyl  10 mg Oral Daily  . carvedilol   3.125 mg Oral BID WC  . Chlorhexidine Gluconate Cloth  6 each Topical Daily  . darbepoetin (ARANESP) injection - NON-DIALYSIS  100 mcg Subcutaneous Q Fri-1800  . diphenhydrAMINE  25 mg Oral Once  . feeding supplement (ENSURE ENLIVE)  237 mL Oral TID BM  . gabapentin  100 mg Oral TID  . heparin  5,000 Units Subcutaneous Q8H  . mouth rinse  15 mL Mouth Rinse BID  . multivitamin with minerals  1 tablet Oral Daily  . senna-docusate  1 tablet Oral QHS  . sertraline  25 mg Oral BID  . sodium chloride flush  3 mL Intravenous Q12H  . sodium chloride flush  3 mL Intravenous Q12H  . spironolactone  50 mg Oral Daily  . torsemide  40 mg Oral Daily   Continuous Infusions: . sodium chloride 500 mL (07/06/19 1043)  . sodium chloride     PRN Meds:.sodium chloride, sodium chloride, acetaminophen, camphor-menthol, hydrOXYzine, morphine injection, ondansetron (ZOFRAN) IV, oxyCODONE, polyvinyl alcohol, sodium chloride flush, traMADol  Xrays DG CHEST PORT 1 VIEW  Result Date: 07/15/2019 CLINICAL DATA:  Post surgery. EXAM: PORTABLE CHEST 1 VIEW COMPARISON:  07/13/2019 FINDINGS: Right-sided chest tube unchanged. Right IJ central venous catheter unchanged with tip over the SVC. Lungs are adequately inflated demonstrate persistent hazy opacification  over the left mid to lower lung and right base as findings may be due to infection. Moderate size left effusion and small right effusion are present. Small right apical pneumothorax which was not visualized on the prior exam. Stable cardiomegaly. Remainder of the exam is unchanged. IMPRESSION: 1. Stable hazy opacification over the left mid to lower lung and stable opacification over the right base as findings may be due to infection. Moderate size left effusion and small right effusion. 2. Small right apical pneumothorax with right-sided chest tube in place. Right IJ central venous catheter unchanged. These results will be called to the ordering clinician or  representative by the Radiologist Assistant, and communication documented in the PACS or zVision Dashboard. Electronically Signed   By: Marin Olp M.D.   On: 07/15/2019 08:44    Assessment/Plan: S/P Procedure(s) (LRB): VIDEO ASSISTED THORACOSCOPY Pericardial window  (Right)  1 feels a little better today , remains quite weak 2 small apical pntx, I didn't see air leak, drainage 130 cc/24 h. Keep tube in place- hopefully can remove tomorrow 3 conts med management per cardiology /nephrology   LOS: 11 days    Joanna Ali [facility] 07/15/2019 Pager [phone]

## 2019-07-15 NOTE — Progress Notes (Signed)
Joanna Ali NEPHROLOGY PROGRESS NOTE  Assessment/ Plan: Pt is a 68 y.o. yo female  with history of HTN, former smoker, hepatic steatosis with low albumin, chronic pericardial effusion, moderate AR admitted on 12/17 for progressive dyspnea, anasarca and fatigue, seen as a consultation for AKI and fluid overload.  #Acute kidney injury, nonoliguric (crt normal in Sept, was 1.43 on 07/03/19) : Likely hemodynamically mediated concomitant with use of IV contrast- U/A not remarkable.  Autoimmune work-up unremarkable in the past.  No hydronephrosis but seems to have had BOO at least after procedure-  Now with foley.   better UOP yest on just oral torsemide and aldactone per her home regimen- BUN and creatinine pretty stable.  She is volume overloaded but difficult to determine severity with associated hypoalbuminemia causing third spacing. Supportive care and  avoid further IV contrast. No uremia.   no indication for dialysis.    #Hyponatremia, hypervolemic: stabe from yesterday, near normal - feel is hypervolemic hyponatremia  #Anasarca presumably due to liver disease which was diagnosed at [facility].  She has a history of liver steatosis. No evidence of GN/nephrotic syndrome.  Is hypoalbuminemic which is causing her to 3rd space  Resumed home doses of diuretics on 12/23- inc torsemide to 40 daily on 12/25  #Moderate to large pericardial effusion:   s/p pericardial window on 12/23- doing well  #Hypertension: Blood pressure acceptable.  On low dose Coreg. No really low BPs recorded   #Anemia: s/p transfusion on 12/22.  gave feraheme and one dose of ESA on 12/23- stable  Subjective:  BP is higher -  Made a liter of urine !-  BUN and crt up but only very slightly - clinically looks better  Objective Vital signs in last 24 hours: Vitals:   07/14/19 2300 07/15/19 0338 07/15/19 0500 07/15/19 0738  BP: 118/81 130/84  133/89  Pulse: 78 75  76  Resp: (!) 23 12  (!) 23  Temp: 97.6 F  (36.4 C) 97.6 F (36.4 C)  97.6 F (36.4 C)  TempSrc: Oral Oral  Oral  SpO2: 100% 100%  97%  Weight:   57.1 kg   Height:       Weight change: 4.9 kg  Intake/Output Summary (Last 24 hours) at 07/15/2019 0743 Last data filed at 07/15/2019 0340 Gross per 24 hour  Intake 725 ml  Output 1180 ml  Net -455 ml       Labs: Basic Metabolic Panel: Recent Labs  Lab 07/14/19 0444 07/14/19 0551 07/15/19 0344  NA 133* 133* 133*  K 4.4 4.8 4.5  CL 91* 95* 92*  CO2 25 18* 26  GLUCOSE 103* 97 98  BUN 96* 100* 105*  CREATININE 3.98* 3.87* 4.15*  CALCIUM 9.1 9.0 8.8*  PHOS 9.0* 9.1* 8.0*   Liver Function Tests: Recent Labs  Lab 07/09/19 0347 07/14/19 0444 07/14/19 0551 07/15/19 0344  AST 37 32  --   --   ALT 16 11  --   --   ALKPHOS 57 49  --   --   BILITOT 0.9 0.6  --   --   PROT 5.5* 5.3*  --   --   ALBUMIN 2.6* 2.8* 2.8* 2.6*   No results for input(s): LIPASE, AMYLASE in the last 168 hours. No results for input(s): AMMONIA in the last 168 hours. CBC: Recent Labs  Lab 07/10/19 0408 07/11/19 0355 07/12/19 0258 07/12/19 1621 07/13/19 0450 07/14/19 0444  WBC 9.1 7.6 7.7  --  14.9* 11.1*  HGB  7.6* 6.8* 9.4* 10.2* 10.4* 9.8*  HCT 23.9* 22.3* 29.0* 30.0* 33.5* 32.2*  MCV 89.5 91.8 88.7  --  91.3 94.2  PLT 234 220 238  --  263 245   Cardiac Enzymes: No results for input(s): CKTOTAL, CKMB, CKMBINDEX, TROPONINI in the last 168 hours. CBG: No results for input(s): GLUCAP in the last 168 hours.  Iron Studies:  No results for input(s): IRON, TIBC, TRANSFERRIN, FERRITIN in the last 72 hours. Studies/Results: No results found.  Medications: Infusions: . sodium chloride 500 mL (07/06/19 1043)  . sodium chloride      Scheduled Medications: . acetaminophen  1,000 mg Oral Q6H   Or  . acetaminophen (TYLENOL) oral liquid 160 mg/5 mL  1,000 mg Oral Q6H  . bisacodyl  10 mg Oral Daily  . carvedilol  3.125 mg Oral BID WC  . Chlorhexidine Gluconate Cloth  6 each  Topical Daily  . darbepoetin (ARANESP) injection - NON-DIALYSIS  100 mcg Subcutaneous Q Fri-1800  . diphenhydrAMINE  25 mg Oral Once  . feeding supplement (ENSURE ENLIVE)  237 mL Oral TID BM  . gabapentin  100 mg Oral TID  . heparin  5,000 Units Subcutaneous Q8H  . mouth rinse  15 mL Mouth Rinse BID  . multivitamin with minerals  1 tablet Oral Daily  . senna-docusate  1 tablet Oral QHS  . sertraline  25 mg Oral BID  . sodium chloride flush  3 mL Intravenous Q12H  . sodium chloride flush  3 mL Intravenous Q12H  . spironolactone  50 mg Oral Daily  . torsemide  40 mg Oral Daily    have reviewed scheduled and prn medications.  Physical Exam: General: looks better- talkative  Heart:RRR, s1s2 nl Lungs:clear b/l, no crackle Abdomen:soft, Non-tender, non-distended Extremities: Bilateral lower extremity pitting edema+ to dep areas    Joanna Ali A Jerral Mccauley 07/15/2019,7:43 AM  LOS: 11 days

## 2019-07-16 ENCOUNTER — Inpatient Hospital Stay (HOSPITAL_COMMUNITY): Payer: Medicare Other

## 2019-07-16 LAB — RENAL FUNCTION PANEL
Albumin: 2.5 g/dL — ABNORMAL LOW (ref 3.5–5.0)
Anion gap: 13 (ref 5–15)
BUN: 111 mg/dL — ABNORMAL HIGH (ref 8–23)
CO2: 26 mmol/L (ref 22–32)
Calcium: 8.8 mg/dL — ABNORMAL LOW (ref 8.9–10.3)
Chloride: 89 mmol/L — ABNORMAL LOW (ref 98–111)
Creatinine, Ser: 4.19 mg/dL — ABNORMAL HIGH (ref 0.44–1.00)
GFR calc Af Amer: 12 mL/min — ABNORMAL LOW (ref >60)
GFR calc non Af Amer: 10 mL/min — ABNORMAL LOW (ref >60)
Glucose, Bld: 90 mg/dL (ref 70–99)
Phosphorus: 7.1 mg/dL — ABNORMAL HIGH (ref 2.5–4.6)
Potassium: 4.4 mmol/L (ref 3.5–5.1)
Sodium: 128 mmol/L — ABNORMAL LOW (ref 135–145)

## 2019-07-16 NOTE — Progress Notes (Signed)
[facility] NEPHROLOG[facility] PROGRESS NOTE  Assessment/ Plan: Pt is a 68 y.o. yo female  with history of HTN, former smoker, hepatic steatosis with low albumin, chronic pericardial effusion, moderate AR admitted on 12/17 for progressive dyspnea, anasarca and fatigue, seen as a consultation for AKI and fluid overload.  #Acute kidney injury, nonoliguric (crt normal in Sept, was 1.43 on 07/03/19) : Likely hemodynamically mediated concomitant with use of IV contrast- U/A not remarkable.  Autoimmune work-up unremarkable in the past.  No hydronephrosis but seems to have had BOO at least after procedure-  Req I and O cath intermittently.   Decent UOP, 2kg down from yesterday and Stable SCr but BUN 111.  K and HCO3 ok.  No immediate HD indication but not out of woods.  Cont to trend labs, Is and Os.  On Torsemide/Spiro.    #Hyponatremia, hypervolemic: a tad worse this AM, doubt related to diuretics, monitor, add 1.2L fluid restriction.  #Anasarca presumably due to liver disease which was diagnosed at [facility].  She has a history of liver steatosis. No evidence of GN/nephrotic syndrome.  Is hypoalbuminemic which is causing her to 3rd space  Resumed home doses of diuretics on 12/23- inc torsemide to 40 daily on 12/25  #Moderate to large pericardial effusion:   s/p pericardial window on 12/23- doing well  #Hypertension: Blood pressure acceptable.  On low dose Coreg. No really low BPs recorded   #Anemia: s/p transfusion on 12/22.  gave feraheme and one dose of ESA on 12/23- stable  Subjective:  Stable, 1L UOP again, req I&O cath ovenight.  Some anorexia no vomiting, no asterixus. BUN 111   Objective Vital signs in last 24 hours: Vitals:   07/15/19 2300 07/16/19 0417 07/16/19 0614 07/16/19 0730  BP: 130/86 114/81 124/84 98/69  Pulse: 70 69 78 71  Resp: 11 16  18   Temp: 97.7 F (36.5 C) (!) 97.5 F (36.4 C)  97.7 F (36.5 C)  TempSrc: Axillary Oral  Oral  SpO2: 100% 98%    Weight:  55.2  kg    Height:       Weight change: -1.9 kg  Intake/Output Summary (Last 24 hours) at 07/16/2019 0904 Last data filed at 07/16/2019 5956 Gross per 24 hour  Intake 406 ml  Output 1180 ml  Net -774 ml       Labs: Basic Metabolic Panel: Recent Labs  Lab 07/14/19 0551 07/15/19 0344 07/16/19 0500  NA 133* 133* 128*  K 4.8 4.5 4.4  CL 95* 92* 89*  CO2 18* 26 26  GLUCOSE 97 98 90  BUN 100* 105* 111*  CREATININE 3.87* 4.15* 4.19*  CALCIUM 9.0 8.8* 8.8*  PHOS 9.1* 8.0* 7.1*   Liver Function Tests: Recent Labs  Lab 07/14/19 0444 07/14/19 0551 07/15/19 0344 07/16/19 0500  AST 32  --   --   --   ALT 11  --   --   --   ALKPHOS 49  --   --   --   BILITOT 0.6  --   --   --   PROT 5.3*  --   --   --   ALBUMIN 2.8* 2.8* 2.6* 2.5*   No results for input(s): LIPASE, AMYLASE in the last 168 hours. No results for input(s): AMMONIA in the last 168 hours. CBC: Recent Labs  Lab 07/10/19 0408 07/11/19 0355 07/12/19 0258 07/12/19 1621 07/13/19 0450 07/14/19 0444  WBC 9.1 7.6 7.7  --  14.9* 11.1*  HGB 7.6* 6.8*  9.4* 10.2* 10.4* 9.8*  HCT 23.9* 22.3* 29.0* 30.0* 33.5* 32.2*  MCV 89.5 91.8 88.7  --  91.3 94.2  PLT 234 220 238  --  263 245   Cardiac Enzymes: No results for input(s): CKTOTAL, CKMB, CKMBINDEX, TROPONINI in the last 168 hours. CBG: No results for input(s): GLUCAP in the last 168 hours.  Iron Studies:  No results for input(s): IRON, TIBC, TRANSFERRIN, FERRITIN in the last 72 hours. Studies/Results: DG CHEST PORT 1 VIEW  Result Date: 07/16/2019 CLINICAL DATA:  Cardiopulmonary status.  Follow-up surgery. EXAM: PORTABLE CHEST 1 VIEW COMPARISON:  07/15/2019 FINDINGS: Right IJ central venous catheter unchanged with tip at the cavoatrial junction. Right-sided chest tube unchanged. Lungs are adequately inflated with stable opacification over the left mid to lower lung likely effusion with associated basilar atelectasis as infection over the mid to lower lung is  possible. Stable hazy opacification over the right base. Small right effusion unchanged. Tiny right apical pneumothorax without significant change. Mild stable cardiomegaly. Remainder of the exam is unchanged. IMPRESSION: 1. Stable hazy opacification over the left mid to lower lung likely moderate size effusion with associated atelectasis. Infection over the mid to lower lung is possible. Stable hazy density over the right base with stable small right effusion. 2.  Stable tiny right apical pneumothorax. 3.  Tubes and lines as described. Electronically Signed   By: Marin Olp M.D.   On: 07/16/2019 08:29   DG CHEST PORT 1 VIEW  Result Date: 07/15/2019 CLINICAL DATA:  Post surgery. EXAM: PORTABLE CHEST 1 VIEW COMPARISON:  07/13/2019 FINDINGS: Right-sided chest tube unchanged. Right IJ central venous catheter unchanged with tip over the SVC. Lungs are adequately inflated demonstrate persistent hazy opacification over the left mid to lower lung and right base as findings may be due to infection. Moderate size left effusion and small right effusion are present. Small right apical pneumothorax which was not visualized on the prior exam. Stable cardiomegaly. Remainder of the exam is unchanged. IMPRESSION: 1. Stable hazy opacification over the left mid to lower lung and stable opacification over the right base as findings may be due to infection. Moderate size left effusion and small right effusion. 2. Small right apical pneumothorax with right-sided chest tube in place. Right IJ central venous catheter unchanged. These results will be called to the ordering clinician or representative by the Radiologist Assistant, and communication documented in the PACS or zVision Dashboard. Electronically Signed   By: Marin Olp M.D.   On: 07/15/2019 08:44    Medications: Infusions: . sodium chloride 500 mL (07/06/19 1043)  . sodium chloride      Scheduled Medications: . acetaminophen  1,000 mg Oral Q6H   Or  .  acetaminophen (TYLENOL) oral liquid 160 mg/5 mL  1,000 mg Oral Q6H  . bisacodyl  10 mg Oral Daily  . carvedilol  3.125 mg Oral BID WC  . Chlorhexidine Gluconate Cloth  6 each Topical Daily  . darbepoetin (ARANESP) injection - NON-DIALYSIS  100 mcg Subcutaneous Q Fri-1800  . diphenhydrAMINE  25 mg Oral Once  . feeding supplement (ENSURE ENLIVE)  237 mL Oral TID BM  . gabapentin  100 mg Oral TID  . heparin  5,000 Units Subcutaneous Q8H  . mouth rinse  15 mL Mouth Rinse BID  . multivitamin with minerals  1 tablet Oral Daily  . senna-docusate  1 tablet Oral QHS  . sertraline  25 mg Oral BID  . sodium chloride flush  3 mL Intravenous Q12H  .  sodium chloride flush  3 mL Intravenous Q12H  . spironolactone  50 mg Oral Daily  . torsemide  40 mg Oral Daily    have reviewed scheduled and prn medications.  Physical Exam: General: NAD, ill appearing Heart:RRR, s1s2 nl Lungs:clear b/l, no crackle Abdomen:soft, Non-tender, non-distended Extremities: Bilateral lower extremity pitting edema+ to dep areas    Isma Tietje B Alayjah Boehringer 07/16/2019,9:04 AM  LOS: 12 days

## 2019-07-16 NOTE — Progress Notes (Signed)
No UOP from patient earlier bladder scan done 414 ml. I&O cath done about 0530 with  625 ml clear yellow urine.

## 2019-07-16 NOTE — Plan of Care (Signed)
  Problem: Education: Goal: Knowledge of General Education information will improve Description: Including pain rating scale, medication(s)/side effects and non-pharmacologic comfort measures Outcome: Progressing   Problem: Health Behavior/Discharge Planning: Goal: Ability to manage health-related needs will improve Outcome: Progressing   Problem: Clinical Measurements: Goal: Ability to maintain clinical measurements within normal limits will improve Outcome: Not Progressing Goal: Will remain free from infection Outcome: Progressing Goal: Diagnostic test results will improve Outcome: Not Progressing Goal: Respiratory complications will improve Outcome: Progressing Goal: Cardiovascular complication will be avoided Outcome: Progressing   Problem: Activity: Goal: Risk for activity intolerance will decrease Outcome: Not Progressing   Problem: Nutrition: Goal: Adequate nutrition will be maintained Outcome: Not Progressing   Problem: Coping: Goal: Level of anxiety will decrease Outcome: Progressing   Problem: Elimination: Goal: Will not experience complications related to bowel motility Outcome: Progressing Goal: Will not experience complications related to urinary retention Outcome: Not Progressing   Problem: Pain Managment: Goal: General experience of comfort will improve Outcome: Progressing   Problem: Safety: Goal: Ability to remain free from injury will improve Outcome: Progressing   Problem: Skin Integrity: Goal: Risk for impaired skin integrity will decrease Outcome: Progressing

## 2019-07-16 NOTE — Progress Notes (Signed)
[address]       [place] [address]             [phone]      4 Days Post-Op Procedure(s) (LRB): VIDEO ASSISTED THORACOSCOPY Pericardial window  (Right) Subjective: Feels fair, som shortness of breath/"coughing spells"  Objective: Vital signs in last 24 hours: Temp:  [97.5 F (36.4 C)-97.8 F (36.6 C)] 97.7 F (36.5 C) (12/27 0730) Pulse Rate:  [25-78] 71 (12/27 0730) Cardiac Rhythm: Normal sinus rhythm (12/27 0826) Resp:  [11-21] 18 (12/27 0730) BP: (98-130)/(69-86) 98/69 (12/27 0730) SpO2:  [98 %-100 %] 98 % (12/27 0417) Weight:  [55.2 kg] 55.2 kg (12/27 0417)  Hemodynamic parameters for last 24 hours:    Intake/Output from previous day: 12/26 0701 - 12/27 0700 In: 506 [P.O.:500; I.V.:6] Out: 1360 [Urine:1000; Chest Tube:360] Intake/Output this shift: Total I/O In: -  Out: 110 [Chest Tube:110]  General appearance: alert, cooperative and no distress Heart: regular rate and rhythm Lungs: mildly dim in bases, fair air movement throughout Abdomen: soft, nontender Extremities: trace edema Wound: dressings intact  Lab Results: Recent Labs    07/14/19 0444  WBC 11.1*  HGB 9.8*  HCT 32.2*  PLT 245   BMET:  Recent Labs    07/15/19 0344 07/16/19 0500  NA 133* 128*  K 4.5 4.4  CL 92* 89*  CO2 26 26  GLUCOSE 98 90  BUN 105* 111*  CREATININE 4.15* 4.19*  CALCIUM 8.8* 8.8*    PT/INR: No results for input(s): LABPROT, INR in the last 72 hours. ABG    Component Value Date/Time   PHART 7.375 07/13/2019 0140   HCO3 26.4 07/13/2019 0140   TCO2 28 07/12/2019 1621   ACIDBASEDEF 3.0 (H) 07/06/2019 1037   O2SAT 97.9 07/13/2019 0140   CBG (last 3)  No results for input(s): GLUCAP in the last 72 hours.  Meds Scheduled Meds: . acetaminophen  1,000 mg Oral Q6H   Or  . acetaminophen (TYLENOL) oral liquid 160 mg/5 mL  1,000 mg Oral Q6H  . bisacodyl  10 mg Oral Daily  . carvedilol  3.125 mg Oral BID WC  . Chlorhexidine Gluconate  Cloth  6 each Topical Daily  . darbepoetin (ARANESP) injection - NON-DIALYSIS  100 mcg Subcutaneous Q Fri-1800  . diphenhydrAMINE  25 mg Oral Once  . feeding supplement (ENSURE ENLIVE)  237 mL Oral TID BM  . gabapentin  100 mg Oral TID  . heparin  5,000 Units Subcutaneous Q8H  . mouth rinse  15 mL Mouth Rinse BID  . multivitamin with minerals  1 tablet Oral Daily  . senna-docusate  1 tablet Oral QHS  . sertraline  25 mg Oral BID  . sodium chloride flush  3 mL Intravenous Q12H  . sodium chloride flush  3 mL Intravenous Q12H  . spironolactone  50 mg Oral Daily  . torsemide  40 mg Oral Daily   Continuous Infusions: . sodium chloride 500 mL (07/06/19 1043)  . sodium chloride     PRN Meds:.sodium chloride, sodium chloride, acetaminophen, camphor-menthol, hydrOXYzine, morphine injection, ondansetron (ZOFRAN) IV, oxyCODONE, polyvinyl alcohol, sodium chloride flush, traMADol  Xrays DG CHEST PORT 1 VIEW  Result Date: 07/16/2019 CLINICAL DATA:  Cardiopulmonary status.  Follow-up surgery. EXAM: PORTABLE CHEST 1 VIEW COMPARISON:  07/15/2019 FINDINGS: Right IJ central venous catheter unchanged with tip at the cavoatrial junction. Right-sided chest tube unchanged. Lungs are adequately inflated with stable opacification over the left mid to lower  lung likely effusion with associated basilar atelectasis as infection over the mid to lower lung is possible. Stable hazy opacification over the right base. Small right effusion unchanged. Tiny right apical pneumothorax without significant change. Mild stable cardiomegaly. Remainder of the exam is unchanged. IMPRESSION: 1. Stable hazy opacification over the left mid to lower lung likely moderate size effusion with associated atelectasis. Infection over the mid to lower lung is possible. Stable hazy density over the right base with stable small right effusion. 2.  Stable tiny right apical pneumothorax. 3.  Tubes and lines as described. Electronically Signed   By:  Marin Olp M.D.   On: 07/16/2019 08:29   DG CHEST PORT 1 VIEW  Result Date: 07/15/2019 CLINICAL DATA:  Post surgery. EXAM: PORTABLE CHEST 1 VIEW COMPARISON:  07/13/2019 FINDINGS: Right-sided chest tube unchanged. Right IJ central venous catheter unchanged with tip over the SVC. Lungs are adequately inflated demonstrate persistent hazy opacification over the left mid to lower lung and right base as findings may be due to infection. Moderate size left effusion and small right effusion are present. Small right apical pneumothorax which was not visualized on the prior exam. Stable cardiomegaly. Remainder of the exam is unchanged. IMPRESSION: 1. Stable hazy opacification over the left mid to lower lung and stable opacification over the right base as findings may be due to infection. Moderate size left effusion and small right effusion. 2. Small right apical pneumothorax with right-sided chest tube in place. Right IJ central venous catheter unchanged. These results will be called to the ordering clinician or representative by the Radiologist Assistant, and communication documented in the PACS or zVision Dashboard. Electronically Signed   By: Marin Olp M.D.   On: 07/15/2019 08:44    Assessment/Plan: S/P Procedure(s) (LRB): VIDEO ASSISTED THORACOSCOPY Pericardial window  (Right)  1 hemodyn stable in sinus rhythm 2 sats ok on 3 liters 3 CXR stable in appearance 4 hyponatremia/AKI - nephrology managing  5 Anasarca- diuretics as per cards/nephrology 6 360 cc drainage from right chest tube yesterday, 110 so far today- keep CT, may need to consider pleurx if drainange remains high. 7 AI- cardiology planning echo at later date  LOS: 12 days    [facility] 07/16/2019 Pager [phone]

## 2019-07-17 ENCOUNTER — Inpatient Hospital Stay (HOSPITAL_COMMUNITY): Payer: Medicare Other

## 2019-07-17 ENCOUNTER — Other Ambulatory Visit: Payer: Self-pay

## 2019-07-17 DIAGNOSIS — I361 Nonrheumatic tricuspid (valve) insufficiency: Secondary | ICD-10-CM

## 2019-07-17 DIAGNOSIS — I340 Nonrheumatic mitral (valve) insufficiency: Secondary | ICD-10-CM

## 2019-07-17 LAB — AEROBIC/ANAEROBIC CULTURE W GRAM STAIN (SURGICAL/DEEP WOUND)
Culture: NO GROWTH
Gram Stain: NONE SEEN

## 2019-07-17 LAB — RENAL FUNCTION PANEL
Albumin: 2.5 g/dL — ABNORMAL LOW (ref 3.5–5.0)
Anion gap: 16 — ABNORMAL HIGH (ref 5–15)
BUN: 118 mg/dL — ABNORMAL HIGH (ref 8–23)
CO2: 23 mmol/L (ref 22–32)
Calcium: 8.7 mg/dL — ABNORMAL LOW (ref 8.9–10.3)
Chloride: 88 mmol/L — ABNORMAL LOW (ref 98–111)
Creatinine, Ser: 4.66 mg/dL — ABNORMAL HIGH (ref 0.44–1.00)
GFR calc Af Amer: 10 mL/min — ABNORMAL LOW (ref >60)
GFR calc non Af Amer: 9 mL/min — ABNORMAL LOW (ref >60)
Glucose, Bld: 89 mg/dL (ref 70–99)
Phosphorus: 7.4 mg/dL — ABNORMAL HIGH (ref 2.5–4.6)
Potassium: 4.4 mmol/L (ref 3.5–5.1)
Sodium: 127 mmol/L — ABNORMAL LOW (ref 135–145)

## 2019-07-17 LAB — ECHOCARDIOGRAM LIMITED
Height: 66 in
Weight: 1989.43 oz

## 2019-07-17 LAB — SEDIMENTATION RATE: Sed Rate: 25 mm/hr — ABNORMAL HIGH (ref 0–22)

## 2019-07-17 LAB — C-REACTIVE PROTEIN: CRP: 13.3 mg/dL — ABNORMAL HIGH (ref <1.0)

## 2019-07-17 NOTE — Plan of Care (Signed)

## 2019-07-17 NOTE — Discharge Instructions (Signed)
Video-Assisted Thoracic Surgery, Care After This sheet gives you information about how to care for yourself after your procedure. Your doctor may also give you more specific instructions. If you have problems or questions, contact your doctor. What can I expect after the procedure? After the procedure, it is common to have:  Some pain and soreness in your chest.  Pain when you breathe in (inhale) and cough.  Trouble pooping (constipation).  Tiredness (fatigue).  Trouble sleeping. Follow these instructions at home: Preventing lung infection (pneumonia)  Take deep breaths or do breathing exercises as told by your doctor.  Cough often. Coughing is important to clear thick spit (phlegm) and open your lungs.  You can make coughing hurt less if you try supporting (splinting) your chest. Try one of these when you cough: ? Hold a pillow against your chest. ? Place both hands flat on top of your cut.  Use an incentive spirometer as told by your doctor. This is a tool that measures how well you can fill your lungs with each breath.  Do lung therapy (pulmonary rehabilitation) as told. Medicines  Take over-the-counter or prescription medicines only as told by your doctor.  If you have pain, take pain-relieving medicine before your pain gets very bad. Doing this will help you breathe and cough more comfortably.  If you were prescribed an antibiotic medicine, take it as told by your doctor. Do not stop taking the antibiotic even if you start to feel better. Activity  Ask your doctor what activities are safe for you.  Avoid activities that use your chest muscles for 3-4 weeks or longer.  Do not lift anything that is heavier than 10 lb (4.5 kg), or the limit that your doctor tells you, until he or she says that it is safe. Cut (incision) care  Follow instructions from your doctor about how to take care of your cut(s) from surgery. Make sure you: ? Wash your hands with soap and water  before you change your bandage (dressing). If you cannot use soap and water, use hand sanitizer. ? Change your bandage as told by your doctor. ? Leave stitches (sutures), skin glue, or skin tape (adhesive) strips in place. They may need to stay in place for 2 weeks or longer. If tape strips get loose and curl up, you may trim the loose edges. Do not remove tape strips completely unless your doctor says it is okay.  Keep your bandage dry until it has been removed.  Every day, check the area around your cut(s) for signs of infection. Check for: ? Redness, swelling, or pain. ? Fluid or blood. ? Warmth. ? Pus or a bad smell. Bathing  Do not take baths, swim, or use a hot tub until your doctor approves. You may take showers.  After your bandage is removed, use soap and water to gently wash the your cut(s) from surgery. Do not use anything else to clean your cut(s) unless your doctor tells you to do this. Driving   Do not drive until your doctor approves.  Do not drive or use heavy machinery while taking prescription pain medicine. Eating and drinking  Eat a healthy diet as told by your doctor. A healthy diet includes: ? Lots of fresh fruits and vegetables. ? Whole grains. ? Low-fat (lean) proteins.  Limit foods that are high in fat and processed sugars. These include fried and sweet foods.  Drink enough fluid to keep your pee (urine) clear or light yellow. General instructions  To prevent or treat trouble pooping while you are taking prescription pain medicine, your doctor may recommend that you: ? Take over-the-counter or prescription medicines. ? Eat foods that have a lot of fiber. These include beans, fresh fruits and vegetables, and whole grains.  Do not use any products that contain nicotine or tobacco. These include cigarettes and e-cigarettes. If you need help quitting, ask your doctor.  Avoid being where people are smoking (avoid secondhand smoke).  Wear compression  stockings as told by your doctor. These stockings help you: ? Not get blood clots in your legs. ? Have less swelling in your legs.  If you have a chest tube, care for it as told by your doctor.  Do not travel by airplane during the 2 weeks after your chest tube is removed, or until your doctor says that this is safe.  Keep all follow-up visits as told by your doctor. This is important. Contact a doctor if:  You have redness, swelling, or pain around a cut from surgery.  You have fluid or blood coming from a cut from surgery.  Your cut(s) from surgery feel warm to the touch.  You have pus or a bad smell coming from a cut from surgery.  You have a fever or chills.  You feel sick to your stomach (nauseous).  You throw up (vomit).  You have pain that does not get better with medicine. Get help right away if:  You have chest pain.  Your heart is fluttering or beating fast.  You start to have a rash.  You have shortness of breath.  You have trouble breathing.  You are confused.  You have trouble talking or understanding.  You feel weak, light-headed, or dizzy.  You faint. Summary  To help prevent lung infection (pneumonia), take deep breaths or do breathing exercises as told by your doctor.  Cough often. This is important for clearing chest fluid (phlegm). You can make coughing hurt less if you hold a pillow to your chest or put your hands flat on the cut(s) when you cough (do splinting).  Do not drive until your doctor approves.  Every day, check your cut(s) for signs of infection. These signs can be redness, swelling, pain, fluid, blood, warmth, pus, or a bad smell.  Eat a healthy diet. This includes lots of fresh fruits and vegetables, whole grains, and low-fat (lean) proteins. This information is not intended to replace advice given to you by your health care provider. Make sure you discuss any questions you have with your health care provider. Document  Released: 10/31/2012 Document Revised: 06/18/2017 Document Reviewed: 06/15/2016 Elsevier Patient Education  2020 [employer].

## 2019-07-17 NOTE — Progress Notes (Addendum)
      [address]       [place] [address]             [phone]       5 Days Post-Op Procedure(s) (LRB): VIDEO ASSISTED THORACOSCOPY Pericardial window  (Right)  Subjective: Patient asking if chest tube is going to be removed  Objective: Vital signs in last 24 hours: Temp:  [97 F (36.1 C)-97.7 F (36.5 C)] 97.3 F (36.3 C) (12/28 0341) Pulse Rate:  [67-79] 79 (12/28 0341) Cardiac Rhythm: Normal sinus rhythm (12/27 2000) Resp:  [13-22] 17 (12/28 0341) BP: (98-128)/(69-87) 128/87 (12/28 0341) SpO2:  [95 %-100 %] 95 % (12/28 0341) Weight:  [56.4 kg] 56.4 kg (12/28 0100)      Intake/Output from previous day: 12/27 0701 - 12/28 0700 In: 400 [P.O.:400] Out: 1050 [Urine:550; Chest Tube:500]   Physical Exam:  Cardiovascular: RRR Pulmonary: Clear to auscultation on the left and diminished right base Abdomen: Soft, non tender, bowel sounds present. Extremities: Mild bilateral lower extremity edema. Wounds: Clean and dry.  No erythema or signs of infection. Chest Tube: to suction, no air leak  Lab Results: CBC:No results for input(s): WBC, HGB, HCT, PLT in the last 72 hours. BMET:  Recent Labs    07/16/19 0500 07/17/19 0420  NA 128* 127*  K 4.4 4.4  CL 89* 88*  CO2 26 23  GLUCOSE 90 89  BUN 111* 118*  CREATININE 4.19* 4.66*  CALCIUM 8.8* 8.7*    PT/INR: No results for input(s): LABPROT, INR in the last 72 hours. ABG:  INR: Will add last result for INR, ABG once components are confirmed Will add last 4 CBG results once components are confirmed  Assessment/Plan:  1. CV - SR. 2.  Pulmonary - On room air this am. Right chest tube with 500 cc last 24 hours. If output continues to be fairly high, may need Pleur X catheter. Await final cytology/pathology results. No CXR ordered for today. Will order for am. 3. Anasarca-presumably due to liver disease which was diagnosed at [facility]. She has a history of liver steatosis. 4. Urinary  retention-I/O cath'd earlier this am. Per primary 5. AKI-Creatinine this am increased to 4.66. Nephrology following 6. Anemia-Last H and H 9.8 and 32.2 7. Hypervolemic hyponatremia-sodium this am [place] 07/17/2019,7:07 AM [phone]  Agree with above High chest tube output Will keep drain for now.  Will remove once output < 254ms for 24hrs.  Would prefer medical optimization over Pleurx placement.  Neko Mcgeehan OBary Leriche

## 2019-07-17 NOTE — Progress Notes (Addendum)
Progress Note  [name] Date of Encounter: 07/17/2019  Primary Cardiologist: Evalina Field, MD   Subjective   68 year old female who was admitted with anasarca.  Echocardiogram has revealed mild LV dysfunction with an ejection fraction of 45% to 50%.  She has moderate to severe aortic insufficiency, moderate to severe tricuspid regurgitation, mild mitral regurgitation.  Heart catheterization on December 17 reveals moderate pulmonary hypertension with a PA pressure of 48/17 with a mean of 28.  Cardiac output was 5.0. She had a moderate sized pericardial effusion but there was no evidence of pericardial tamponade or constrictive physiology. She had a pericardial window on December 23 and appears to be doing well.  She has acute kidney failure.  Creatinine was 0.67 on April 04, 2019.  Since that time it is increased and today it is 4.66.     She has been found to have liver disease which was diagnosed at [facility].    Inpatient Medications    Scheduled Meds: . acetaminophen  1,000 mg Oral Q6H   Or  . acetaminophen (TYLENOL) oral liquid 160 mg/5 mL  1,000 mg Oral Q6H  . bisacodyl  10 mg Oral Daily  . carvedilol  3.125 mg Oral BID WC  . Chlorhexidine Gluconate Cloth  6 each Topical Daily  . darbepoetin (ARANESP) injection - NON-DIALYSIS  100 mcg Subcutaneous Q Fri-1800  . diphenhydrAMINE  25 mg Oral Once  . feeding supplement (ENSURE ENLIVE)  237 mL Oral TID BM  . gabapentin  100 mg Oral TID  . heparin  5,000 Units Subcutaneous Q8H  . mouth rinse  15 mL Mouth Rinse BID  . multivitamin with minerals  1 tablet Oral Daily  . senna-docusate  1 tablet Oral QHS  . sertraline  25 mg Oral BID  . sodium chloride flush  3 mL Intravenous Q12H  . sodium chloride flush  3 mL Intravenous Q12H  . spironolactone  50 mg Oral Daily  . torsemide  40 mg Oral Daily   Continuous Infusions: . sodium chloride 500 mL (07/06/19 1043)  . sodium chloride     PRN Meds: sodium  chloride, sodium chloride, acetaminophen, camphor-menthol, hydrOXYzine, morphine injection, ondansetron (ZOFRAN) IV, oxyCODONE, polyvinyl alcohol, sodium chloride flush, traMADol   Vital Signs    Vitals:   07/17/19 0100 07/17/19 0341 07/17/19 0722 07/17/19 0800  BP:  128/87 (!) 146/92 129/80  Pulse:  79 77 74  Resp:  17 (!) 21 14  Temp:  (!) 97.3 F (36.3 C) (!) 97.5 F (36.4 C)   TempSrc:  Oral Oral   SpO2:  95% 98% 96%  Weight: 56.4 kg     Height:        Intake/Output Summary (Last 24 hours) at 07/17/2019 7915 Last data filed at 07/17/2019 0400 Gross per 24 hour  Intake 400 ml  Output 940 ml  Net -540 ml   Last 3 Weights 07/17/2019 07/16/2019 07/15/2019  Weight (lbs) 124 lb 5.4 oz 121 lb 11.1 oz 125 lb 14.1 oz  Weight (kg) 56.4 kg 55.2 kg 57.1 kg      Telemetry    NSR  - Personally Reviewed  ECG      - Personally Reviewed  Physical Exam   GEN:  Chronically ill-appearing female, no acute distress. Neck: No JVD Cardiac:  Regular rate S1-S2.  She has a moderate systolic murmur. Respiratory:  Coarse rales.  She has a pleural rub on her right side corresponding to her right chest tube.  GI:  Edema in the lower half of her abdomen. MS:  3+ pitting edema in her legs.  There is no edema of her arms or upper body. Neuro:  Nonfocal  Psych: Normal affect   Labs    High Sensitivity Troponin:   Recent Labs  Lab 07/03/19 1739 07/03/19 1930  TROPONINIHS 149* 163*      Chemistry Recent Labs  Lab 07/14/19 0444 07/15/19 0344 07/16/19 0500 07/17/19 0420  NA 133* 133* 128* 127*  K 4.4 4.5 4.4 4.4  CL 91* 92* 89* 88*  CO2 25 26 26 23   GLUCOSE 103* 98 90 89  BUN 96* 105* 111* 118*  CREATININE 3.98* 4.15* 4.19* 4.66*  CALCIUM 9.1 8.8* 8.8* 8.7*  PROT 5.3*  --   --   --   ALBUMIN 2.8* 2.6* 2.5* 2.5*  AST 32  --   --   --   ALT 11  --   --   --   ALKPHOS 49  --   --   --   BILITOT 0.6  --   --   --   GFRNONAA 11* 10* 10* 9*  GFRAA 12* 12* 12* 10*  ANIONGAP  17* 15 13 16*     Hematology Recent Labs  Lab 07/12/19 0258 07/12/19 1621 07/13/19 0450 07/14/19 0444  WBC 7.7  --  14.9* 11.1*  RBC 3.27*  --  3.67* 3.42*  HGB 9.4* 10.2* 10.4* 9.8*  HCT 29.0* 30.0* 33.5* 32.2*  MCV 88.7  --  91.3 94.2  MCH 28.7  --  28.3 28.7  MCHC 32.4  --  31.0 30.4  RDW 18.1*  --  18.7* 18.9*  PLT 238  --  263 245    BNPNo results for input(s): BNP, PROBNP in the last 168 hours.   DDimer No results for input(s): DDIMER in the last 168 hours.   Radiology    DG CHEST PORT 1 VIEW  Result Date: 07/16/2019 CLINICAL DATA:  Cardiopulmonary status.  Follow-up surgery. EXAM: PORTABLE CHEST 1 VIEW COMPARISON:  07/15/2019 FINDINGS: Right IJ central venous catheter unchanged with tip at the cavoatrial junction. Right-sided chest tube unchanged. Lungs are adequately inflated with stable opacification over the left mid to lower lung likely effusion with associated basilar atelectasis as infection over the mid to lower lung is possible. Stable hazy opacification over the right base. Small right effusion unchanged. Tiny right apical pneumothorax without significant change. Mild stable cardiomegaly. Remainder of the exam is unchanged. IMPRESSION: 1. Stable hazy opacification over the left mid to lower lung likely moderate size effusion with associated atelectasis. Infection over the mid to lower lung is possible. Stable hazy density over the right base with stable small right effusion. 2.  Stable tiny right apical pneumothorax. 3.  Tubes and lines as described. Electronically Signed   By: Marin Olp M.D.   On: 07/16/2019 08:29    Cardiac Studies     Patient Profile     68 y.o. female with lower body anasarca presumably due to liver disease.   Assessment & Plan    1.  Anasarca: The patient appears to have anasarca related to her liver disease.  She has significant edema in the lower half of her body but her upper body does not have any edema.  Right heart Cardiac  cath showed moderate pulmonary hypertension but no evidence of cardiac tamponade or constrictive pericarditis. She has progressive acute renal failure.  Nephrology is consulting.  2.  Pericardial effusion: The  patient did not have any evidence of pericardial tamponade but did have a pericardial window on December 23 because of the presence of pericardial effusion as well as pleural effusions.  The chest tube is still in.  Is been managed by Dr. Kipp Brood.  3.  Moderate aortic insufficiency: Stable.  No evidence of endocarditis.  4.  Acute renal failure: Patient is being followed by nephrology.  There is some conversation about starting hemodialysis soon. Creatinine has continued to increase K is 5.0  Will DC aldactone for now  Will defer to nephrology     For questions or updates, please contact [place] Please consult [email or web address] for contact info under        Signed, Mertie Moores, MD  07/17/2019, 9:37 AM

## 2019-07-17 NOTE — Progress Notes (Signed)
No UOP since last I & O cath  yesterday morning. Bladder scan this morning and 444m. I&O cath done with 550 ml clear yellow urine.

## 2019-07-17 NOTE — Progress Notes (Signed)
  Echocardiogram 2D Echocardiogram has been performed.  Randa Lynn Albina Gosney 07/17/2019, 3:17 PM

## 2019-07-17 NOTE — Progress Notes (Signed)
[facility]GRESS [facility]  Assessment/ Plan: Pt is a 68 y.o. yo female  with history of HTN, former smoker, hepatic steatosis with low albumin, chronic pericardial effusion, moderate AR admitted on 12/17 for progressive dyspnea, anasarca and fatigue, seen as a consultation for AKI and fluid overload.  #Acute kidney injury, nonoliguric (crt normal in Sept, was 1.43 on 07/03/19) : Likely hemodynamically mediated concomitant with use of IV contrast- U/A not remarkable.  Autoimmune work-up unremarkable in the past.  No hydronephrosis but seems to have had BOO at least after procedure- Cont to req I&O Cath and worsening GFR, will place Foley today. If no better tomorrow will probably need HD.    #Hyponatremia, hypervolemic: worse this AM, doubt related to diuretics, monitor, 1.2L fluid restriction.  #Anasarca presumably due to liver disease which was diagnosed at [facility].  She has a history of liver steatosis. No evidence of GN/nephrotic syndrome. 3rd spacing also. Cont diuretics for now.   #Moderate to large pericardial effusion:   s/p pericardial window on 12/23- doing well  #Hypertension: Blood pressure acceptable.  On low dose Coreg. . . #Anemia: s/p transfusion on 12/22.  gave feraheme and one dose of ESA on 12/23- stable  Subjective:    Cont to req I&O Cath, 0.5L UOP reported  SCr worsened and BUN 118, K 4.4, HCO3 23  Some anorexia but no N/V  Some confusion  Objective Vital signs in last 24 hours: Vitals:   07/17/19 0100 07/17/19 0341 07/17/19 0722 07/17/19 0800  BP:  128/87 (!) 146/92 129/80  Pulse:  79 77 74  Resp:  17 (!) 21 14  Temp:  (!) 97.3 F (36.3 C) (!) 97.5 F (36.4 C)   TempSrc:  Oral Oral   SpO2:  95% 98% 96%  Weight: 56.4 kg     Height:       Weight change: 1.2 kg  Intake/Output Summary (Last 24 hours) at 07/17/2019 0623 Last data filed at 07/17/2019 0400 Gross per 24 hour  Intake 400 ml  Output 940 ml  Net -540 ml        Labs: Basic Metabolic Panel: Recent Labs  Lab 07/15/19 0344 07/16/19 0500 07/17/19 0420  NA 133* 128* 127*  K 4.5 4.4 4.4  CL 92* 89* 88*  CO2 26 26 23   GLUCOSE 98 90 89  BUN 105* 111* 118*  CREATININE 4.15* 4.19* 4.66*  CALCIUM 8.8* 8.8* 8.7*  PHOS 8.0* 7.1* 7.4*   Liver Function Tests: Recent Labs  Lab 07/14/19 0444 07/15/19 0344 07/16/19 0500 07/17/19 0420  AST 32  --   --   --   ALT 11  --   --   --   ALKPHOS 49  --   --   --   BILITOT 0.6  --   --   --   PROT 5.3*  --   --   --   ALBUMIN 2.8* 2.6* 2.5* 2.5*   No results for input(s): LIPASE, AMYLASE in the last 168 hours. No results for input(s): AMMONIA in the last 168 hours. CBC: Recent Labs  Lab 07/11/19 0355 07/12/19 0258 07/12/19 1621 07/13/19 0450 07/14/19 0444  WBC 7.6 7.7  --  14.9* 11.1*  HGB 6.8* 9.4* 10.2* 10.4* 9.8*  HCT 22.3* 29.0* 30.0* 33.5* 32.2*  MCV 91.8 88.7  --  91.3 94.2  PLT 220 238  --  263 245   Cardiac Enzymes: No results for input(s): CKTOTAL, CKMB, CKMBINDEX, TROPONINI in the last 168  hours. CBG: No results for input(s): GLUCAP in the last 168 hours.  Iron Studies:  No results for input(s): IRON, TIBC, TRANSFERRIN, FERRITIN in the last 72 hours. Studies/Results: DG CHEST PORT 1 VIEW  Result Date: 07/16/2019 CLINICAL DATA:  Cardiopulmonary status.  Follow-up surgery. EXAM: PORTABLE CHEST 1 VIEW COMPARISON:  07/15/2019 FINDINGS: Right IJ central venous catheter unchanged with tip at the cavoatrial junction. Right-sided chest tube unchanged. Lungs are adequately inflated with stable opacification over the left mid to lower lung likely effusion with associated basilar atelectasis as infection over the mid to lower lung is possible. Stable hazy opacification over the right base. Small right effusion unchanged. Tiny right apical pneumothorax without significant change. Mild stable cardiomegaly. Remainder of the exam is unchanged. IMPRESSION: 1. Stable hazy opacification  over the left mid to lower lung likely moderate size effusion with associated atelectasis. Infection over the mid to lower lung is possible. Stable hazy density over the right base with stable small right effusion. 2.  Stable tiny right apical pneumothorax. 3.  Tubes and lines as described. Electronically Signed   By: Marin Olp M.D.   On: 07/16/2019 08:29    Medications: Infusions: . sodium chloride 500 mL (07/06/19 1043)  . sodium chloride      Scheduled Medications: . acetaminophen  1,000 mg Oral Q6H   Or  . acetaminophen (TYLENOL) oral liquid 160 mg/5 mL  1,000 mg Oral Q6H  . bisacodyl  10 mg Oral Daily  . carvedilol  3.125 mg Oral BID WC  . Chlorhexidine Gluconate Cloth  6 each Topical Daily  . darbepoetin (ARANESP) injection - NON-DIALYSIS  100 mcg Subcutaneous Q Fri-1800  . diphenhydrAMINE  25 mg Oral Once  . feeding supplement (ENSURE ENLIVE)  237 mL Oral TID BM  . gabapentin  100 mg Oral TID  . heparin  5,000 Units Subcutaneous Q8H  . mouth rinse  15 mL Mouth Rinse BID  . multivitamin with minerals  1 tablet Oral Daily  . senna-docusate  1 tablet Oral QHS  . sertraline  25 mg Oral BID  . sodium chloride flush  3 mL Intravenous Q12H  . sodium chloride flush  3 mL Intravenous Q12H  . spironolactone  50 mg Oral Daily  . torsemide  40 mg Oral Daily    have reviewed scheduled and prn medications.  Physical Exam: General: NAD, ill appearing Heart:RRR, s1s2 nl Lungs:clear b/l, no crackle Abdomen:soft, Non-tender, non-distended Extremities: Bilateral lower extremity pitting edema+ to dep areas    Joanna Ali B Joanna Ali 07/17/2019,9:22 AM  LOS: 13 days

## 2019-07-18 ENCOUNTER — Inpatient Hospital Stay (HOSPITAL_COMMUNITY): Payer: Medicare Other

## 2019-07-18 HISTORY — PX: IR FLUORO GUIDE CV LINE LEFT: IMG2282

## 2019-07-18 HISTORY — PX: IR US GUIDE VASC ACCESS LEFT: IMG2389

## 2019-07-18 LAB — RENAL FUNCTION PANEL
Albumin: 2.4 g/dL — ABNORMAL LOW (ref 3.5–5.0)
Anion gap: 18 — ABNORMAL HIGH (ref 5–15)
BUN: 128 mg/dL — ABNORMAL HIGH (ref 8–23)
CO2: 22 mmol/L (ref 22–32)
Calcium: 8.7 mg/dL — ABNORMAL LOW (ref 8.9–10.3)
Chloride: 84 mmol/L — ABNORMAL LOW (ref 98–111)
Creatinine, Ser: 5.1 mg/dL — ABNORMAL HIGH (ref 0.44–1.00)
GFR calc Af Amer: 9 mL/min — ABNORMAL LOW (ref >60)
GFR calc non Af Amer: 8 mL/min — ABNORMAL LOW (ref >60)
Glucose, Bld: 99 mg/dL (ref 70–99)
Phosphorus: 7.5 mg/dL — ABNORMAL HIGH (ref 2.5–4.6)
Potassium: 4.3 mmol/L (ref 3.5–5.1)
Sodium: 124 mmol/L — ABNORMAL LOW (ref 135–145)

## 2019-07-18 LAB — HEPATITIS B CORE ANTIBODY, TOTAL: Hep B Core Total Ab: NONREACTIVE

## 2019-07-18 LAB — CYTOLOGY - NON PAP

## 2019-07-18 LAB — HEPATITIS B SURFACE ANTIGEN: Hepatitis B Surface Ag: NONREACTIVE

## 2019-07-18 LAB — SURGICAL PATHOLOGY

## 2019-07-18 MED ORDER — MIDAZOLAM HCL 2 MG/2ML IJ SOLN
INTRAMUSCULAR | Status: AC
  Filled 2019-07-18: qty 2

## 2019-07-18 MED ORDER — FENTANYL CITRATE (PF) 100 MCG/2ML IJ SOLN
INTRAMUSCULAR | Status: AC
  Filled 2019-07-18: qty 2

## 2019-07-18 MED ORDER — HEPARIN SODIUM (PORCINE) 1000 UNIT/ML IJ SOLN
INTRAMUSCULAR | Status: AC
  Administered 2019-07-18: 1000 [IU]
  Filled 2019-07-18: qty 4

## 2019-07-18 MED ORDER — FENTANYL CITRATE (PF) 100 MCG/2ML IJ SOLN
INTRAMUSCULAR | Status: AC | PRN
  Administered 2019-07-18: 50 ug via INTRAVENOUS

## 2019-07-18 MED ORDER — LIDOCAINE HCL 1 % IJ SOLN
INTRAMUSCULAR | Status: AC
  Filled 2019-07-18: qty 20

## 2019-07-18 MED ORDER — CEFAZOLIN SODIUM-DEXTROSE 2-4 GM/100ML-% IV SOLN
INTRAVENOUS | Status: AC
  Administered 2019-07-18: 13:00:00 2000 mg
  Filled 2019-07-18: qty 100

## 2019-07-18 MED ORDER — VITAL 1.5 CAL PO LIQD
1000.0000 mL | ORAL | Status: DC
  Administered 2019-07-19 – 2019-07-25 (×9): 1000 mL
  Filled 2019-07-18 (×12): qty 1000

## 2019-07-18 MED ORDER — HEPARIN SODIUM (PORCINE) 1000 UNIT/ML IJ SOLN
INTRAMUSCULAR | Status: AC
  Administered 2019-07-18: 3.8 mL
  Filled 2019-07-18: qty 1

## 2019-07-18 MED ORDER — LIDOCAINE HCL (PF) 1 % IJ SOLN
INTRAMUSCULAR | Status: AC | PRN
  Administered 2019-07-18: 5 mL

## 2019-07-18 MED ORDER — MIDAZOLAM HCL 2 MG/2ML IJ SOLN
INTRAMUSCULAR | Status: AC | PRN
  Administered 2019-07-18: 1 mg via INTRAVENOUS

## 2019-07-18 NOTE — Progress Notes (Signed)
[facility] PROGRESS NOTE  Assessment/ Plan: Pt is a 68 y.o. yo female  with history of HTN, former smoker, hepatic steatosis with low albumin, chronic pericardial effusion, moderate AR admitted on 12/17 for progressive dyspnea, anasarca and fatigue, seen as a consultation for AKI and fluid overload.  #Acute kidney injury, nonoliguric (crt normal in Sept, was 1.43 on 07/03/19) : Likely hemodynamically mediated concomitant with use of IV contrast- U/A not remarkable.  Autoimmune work-up unremarkable in the past. No improvement despite Foley catheter.  Having nausea/vomiting during my evaluation, worsening BUN and creatinine.  Given the onset of uremia and an adequate UOP we will start dialysis today.  We will have IR place HD catheter and have first treatment thereafter.  #Hyponatremia, hypervolemic: worse this AM, doubt related to diuretics, monitor, 1.2L fluid restriction.  Gentle HD, will partially correct  #Anasarca presumably due to liver disease which was diagnosed at [facility].  She has a history of liver steatosis. No evidence of GN/nephrotic syndrome. 3rd spacing also. Cont diuretics for now, 1L UF with HD today  #Moderate to large pericardial effusion:   s/p pericardial window on 12/23- TCTS following, stable  #Hypertension: Blood pressure acceptable.  On low dose Coreg. . . #Anemia: s/p transfusion on 12/22.  gave feraheme and one dose of ESA on 12/23- stable  Subjective:    N/V this AM  BUN/SCr workse  UOP only 0.55L  D/w pt and husband rec to start HD, they agree  Objective Vital signs in last 24 hours: Vitals:   07/18/19 0300 07/18/19 0500 07/18/19 0810 07/18/19 1107  BP: 113/74  124/79 120/80  Pulse:   71 70  Resp: 16  15 14   Temp: (!) 97.5 F (36.4 C)  (!) 97.4 F (36.3 C) (!) 97.5 F (36.4 C)  TempSrc: Oral  Oral Oral  SpO2:   97% 97%  Weight:  54 kg    Height:       Weight change: -2.4 kg  Intake/Output Summary (Last 24 hours)  at 07/18/2019 1123 Last data filed at 07/18/2019 0800 Gross per 24 hour  Intake 0 ml  Output 450 ml  Net -450 ml       Labs: Basic Metabolic Panel: Recent Labs  Lab 07/16/19 0500 07/17/19 0420 07/18/19 0500  NA 128* 127* 124*  K 4.4 4.4 4.3  CL 89* 88* 84*  CO2 26 23 22   GLUCOSE 90 89 99  BUN 111* 118* 128*  CREATININE 4.19* 4.66* 5.10*  CALCIUM 8.8* 8.7* 8.7*  PHOS 7.1* 7.4* 7.5*   Liver Function Tests: Recent Labs  Lab 07/14/19 0444 07/16/19 0500 07/17/19 0420 07/18/19 0500  AST 32  --   --   --   ALT 11  --   --   --   ALKPHOS 49  --   --   --   BILITOT 0.6  --   --   --   PROT 5.3*  --   --   --   ALBUMIN 2.8* 2.5* 2.5* 2.4*   No results for input(s): LIPASE, AMYLASE in the last 168 hours. No results for input(s): AMMONIA in the last 168 hours. CBC: Recent Labs  Lab 07/12/19 0258 07/12/19 1621 07/13/19 0450 07/14/19 0444  WBC 7.7  --  14.9* 11.1*  HGB 9.4* 10.2* 10.4* 9.8*  HCT 29.0* 30.0* 33.5* 32.2*  MCV 88.7  --  91.3 94.2  PLT 238  --  263 245   Cardiac Enzymes: No results for input(s):  CKTOTAL, CKMB, CKMBINDEX, TROPONINI in the last 168 hours. CBG: No results for input(s): GLUCAP in the last 168 hours.  Iron Studies:  No results for input(s): IRON, TIBC, TRANSFERRIN, FERRITIN in the last 72 hours. Studies/Results: DG CHEST PORT 1 VIEW  Result Date: 07/18/2019 CLINICAL DATA:  Pleural effusion. Additional history provided: Chest tube present, pneumothorax, shortness of breath EXAM: PORTABLE CHEST 1 VIEW COMPARISON:  Chest radiograph 07/16/2019 FINDINGS: Right IJ approach central venous catheter and right basilar chest tube, unchanged. Overlying cardiac monitoring leads. Unchanged cardiomegaly. A previously demonstrated tiny right apical pneumothorax is not well appreciated on today's study. Unchanged hazy opacity at the right lung base consistent with small effusion with atelectasis and/or pneumonia. Persistent small to moderate left pleural  effusion with left basilar atelectasis. IMPRESSION: Support apparatus unchanged. A previously demonstrated tiny right apical pneumothorax is not well appreciated on today's study. Unchanged small right effusion with right basilar atelectasis and/or pneumonia. Persistent small to moderate left pleural effusion with left basilar atelectasis. Electronically Signed   By: Kellie Simmering DO   On: 07/18/2019 08:09   ECHOCARDIOGRAM LIMITED  Result Date: 07/17/2019   ECHOCARDIOGRAM REPORT   Patient Name:   GLENDER AUGUSTA Date of Exam: 07/17/2019 Medical Rec #:  [id number]    Height:       66.0 in Accession #:    [id number]   Weight:       124.3 lb Date of Birth:  February 25, 1951    BSA:          1.63 m Patient Age:    1 years     BP:           125/78 mmHg Patient Gender: F            HR:           71 bpm. Exam Location:  Inpatient Procedure: Limited Echo, Cardiac Doppler and Limited Color Doppler Indications:     I31.3 Pericardial effusion  History:         Patient has prior history of Echocardiogram examinations, most                  recent 07/09/2019. Signs/Symptoms:Dyspnea; Risk                  Factors:Hypertension.  Sonographer:     Tiffany Dance Referring Phys:  [id number] [place] Diagnosing Phys: Lyman Bishop MD IMPRESSIONS  1. Left ventricular ejection fraction, by visual estimation, is 50 to 55%. The left ventricle has low normal function. There is mildly increased left ventricular hypertrophy.  2. Moderate hypokinesis of the left ventricular, entire septal wall.  3. The left ventricle demonstrates regional wall motion abnormalities.  4. Global right ventricle has normal systolic function.The right ventricular size is normal. No increase in right ventricular wall thickness.  5. Left atrial size was normal.  6. Right atrial size was mildly dilated.  7. Moderate pleural effusion in the left lateral region.  8. The pericardial effusion is circumferential.  9. Trivial pericardial effusion is present. 10. The  mitral valve is abnormal. Mild mitral valve regurgitation. 11. The tricuspid valve is grossly normal. Tricuspid valve regurgitation moderate. 12. Aortic valve regurgitation is moderate. 13. The aortic valve is tricuspid. Aortic valve regurgitation is moderate. Mild aortic valve sclerosis without stenosis. 14. The pulmonic valve was grossly normal. Pulmonic valve regurgitation is not visualized. 15. Moderately elevated pulmonary artery systolic pressure. 16. The inferior vena cava is dilated in size with >50% respiratory variability,  suggesting right atrial pressure of 8 mmHg. FINDINGS  Left Ventricle: Left ventricular ejection fraction, by visual estimation, is 50 to 55%. The left ventricle has low normal function. Moderate hypokinesis of the left ventricular, entire septal wall. The left ventricle demonstrates regional wall motion abnormalities. There is mildly increased left ventricular hypertrophy. Right Ventricle: The right ventricular size is normal. No increase in right ventricular wall thickness. Global RV systolic function is has normal systolic function. The tricuspid regurgitant velocity is 2.91 m/s, and with an assumed right atrial pressure  of 8 mmHg, the estimated right ventricular systolic pressure is moderately elevated at 42.0 mmHg. Left Atrium: Left atrial size was normal in size. Right Atrium: Right atrial size was mildly dilated Pericardium: Trivial pericardial effusion is present. The pericardial effusion is circumferential. There is a moderate pleural effusion in the left lateral region. Mitral Valve: The mitral valve is abnormal. There is mild thickening of the mitral valve leaflet(s). Mild mitral valve regurgitation. Tricuspid Valve: The tricuspid valve is grossly normal. Tricuspid valve regurgitation moderate. Aortic Valve: The aortic valve is tricuspid. Aortic valve regurgitation is moderate. Aortic regurgitation PHT measures 480 msec. Mild aortic valve sclerosis is present, with no evidence  of aortic valve stenosis. Pulmonic Valve: The pulmonic valve was grossly normal. Pulmonic valve regurgitation is not visualized. Pulmonic regurgitation is not visualized. Aorta: The aortic root and ascending aorta are structurally normal, with no evidence of dilitation. Venous: The inferior vena cava is dilated in size with greater than 50% respiratory variability, suggesting right atrial pressure of 8 mmHg. IAS/Shunts: No atrial level shunt detected by color flow Doppler.  LEFT VENTRICLE PLAX 2D LVIDd:         4.70 cm LVIDs:         3.20 cm LV PW:         1.20 cm LV IVS:        0.80 cm LVOT diam:     1.90 cm LV SV:         61 ml LV SV Index:   37.99 LVOT Area:     2.84 cm  RIGHT VENTRICLE          IVC RV Basal diam:  3.30 cm  IVC diam: 2.10 cm RV Mid diam:    1.50 cm LEFT ATRIUM             Index       RIGHT ATRIUM           Index LA diam:        4.10 cm 2.51 cm/m  RA Area:     21.70 cm LA Vol (A2C):   71.5 ml 43.76 ml/m RA Volume:   59.90 ml  36.66 ml/m LA Vol (A4C):   47.6 ml 29.13 ml/m LA Biplane Vol: 62.6 ml 38.31 ml/m  AORTIC VALVE LVOT Vmax:   87.90 cm/s LVOT Vmean:  58.000 cm/s LVOT VTI:    0.165 m AI PHT:      480 msec  AORTA Ao Root diam: 3.90 cm Ao Asc diam:  3.70 cm MITRAL VALVE                        TRICUSPID VALVE MV Area (PHT): 4.49 cm             TR Peak grad:   34.0 mmHg MV PHT:        49.01 msec           TR Vmax:  306.00 cm/s MV Decel Time: 169 msec MV E velocity: 60.10 cm/s 103 cm/s  SHUNTS MV A velocity: 52.90 cm/s 70.3 cm/s Systemic VTI:  0.16 m MV E/A ratio:  1.14       1.5       Systemic Diam: 1.90 cm  Lyman Bishop MD Electronically signed by Lyman Bishop MD Signature Date/Time: 07/17/2019/4:21:31 PM    Final     Medications: Infusions: . sodium chloride 500 mL (07/06/19 1043)  . sodium chloride    . [START ON 07/19/2019] feeding supplement (VITAL 1.5 CAL)      Scheduled Medications: . bisacodyl  10 mg Oral Daily  . carvedilol  3.125 mg Oral BID WC  .  Chlorhexidine Gluconate Cloth  6 each Topical Daily  . darbepoetin (ARANESP) injection - NON-DIALYSIS  100 mcg Subcutaneous Q Fri-1800  . diphenhydrAMINE  25 mg Oral Once  . feeding supplement (ENSURE ENLIVE)  237 mL Oral TID BM  . gabapentin  100 mg Oral TID  . heparin  5,000 Units Subcutaneous Q8H  . mouth rinse  15 mL Mouth Rinse BID  . senna-docusate  1 tablet Oral QHS  . sertraline  25 mg Oral BID  . sodium chloride flush  3 mL Intravenous Q12H  . sodium chloride flush  3 mL Intravenous Q12H  . torsemide  40 mg Oral Daily    have reviewed scheduled and prn medications.  Physical Exam: General: NAD, ill appearing Heart:RRR, s1s2 nl Lungs:clear b/l, no crackle Abdomen:soft, Non-tender, non-distended Extremities: Bilateral lower extremity pitting edema+ to dep areas    Kynsleigh Westendorf B Boniface Goffe 07/18/2019,11:23 AM  LOS: 14 days

## 2019-07-18 NOTE — Progress Notes (Signed)
Nutrition Follow-up  RD working remotely.  DOCUMENTATION CODES:   Severe malnutrition in context of chronic illness, Underweight  INTERVENTION:   Plan for Cortrak placement tomorrow, 07/19/19. Once Cortrak placed, initiate enteral nutrition: - Start Vital 1.5 @ 20 ml/hr and advance by 10 ml q 4 hours until goal rate of 50 ml/hr (1200 ml/day)  Tube feeding regimen at goal provides 1800 kcal, 81 grams of protein, and 917 ml of H2O.   Monitor magnesium, potassium, and phosphorus daily for at least 3 days, MD to replete as needed, as pt is at risk for refeeding syndrome given severe malnutrition and inadequate nutrition since admission.  - Continue Ensure Enlive po TID, each supplement provides 350 kcal and 20 grams of protein  - d/c MVI with minerals  NUTRITION DIAGNOSIS:   Severe Malnutrition related to chronic illness (pericardial effusion w/ volume overload) as evidenced by moderate fat depletion, severe muscle depletion, percent weight loss.  Ongoing  GOAL:   Patient will meet greater than or equal to 90% of their needs  Unmet  MONITOR:   PO intake, Supplement acceptance, Labs, TF tolerance, Weight trends, I & O's  REASON FOR ASSESSMENT:   Malnutrition Screening Tool, Consult Assessment of nutrition requirement/status  ASSESSMENT:   Patient with PMH significant for chronic pericardial effusion, moderate AVR, HTN, hepatic steatosis, and peripheral neuropathy. Presents this admission with anasarca and bilateral pleural effusions.  12/15 - s/p bilateral thoracentesis with 700 ml fluid removed 12/17 - s/p cardiac cath 12/23 - s/p VATS, pericardial window  Noted plan to start dialysis today. Pt is NPO at this time.  Current weight is 5 lbs above admission weight. Suspect weight gain related to fluid status. Per RN edema assessment, pt with moderate pitting edema to BLE.  Spoke with pt via phone call to room. Pt sounds very weak on the phone. Pt reports that her  appetite and her eating are "not so good." Pt reports that she has no appetite and that she is experiencing nausea.  Pt has Ensure Enlive ordered TID. Pt reports she is trying to drink 1 Ensure Enlive daily. Per [facility] documentation, pt is refusing ~50% of Ensure Enlive supplements  Discussed possibility of small-bore feeding tube with pt. Pt states she doesn't think she can do that today because she is having surgery. RD explained that Cortrak would be placed tomorrow, 07/19/19. Pt states she will think about it.  Discussed recommendation for Cortrak with Dr. Acie Fredrickson who agreed with plan. Placed consult to Cortrak team and consult for tube feeding initiation and management. RD will place orders for tube feeds to start tomorrow, 07/19/19. Discussed plan with RN.  Meal Completion: 0-90% x last 8 meals  Medications reviewed and include: dulcolax, Ensure Enlive TID, MVI with minerals, senna, torsemide  Labs reviewed: sodium 124, chloride 84, phosphorus 7.5  UOP: 550 ml x 24 hours CT: 240 ml x 24 hours I/O's: -6.9 L since admit  Diet Order:   Diet Order            Diet NPO time specified  Diet effective midnight              EDUCATION NEEDS:   Education needs have been addressed  Skin:  Skin Assessment: Skin Integrity Issues: Incisions: chest Other: cracking/dryness to BLE  Last BM:  07/08/19  Height:   Ht Readings from Last 1 Encounters:  07/04/19 5' 6"  (1.676 m)    Weight:   Wt Readings from Last 1 Encounters:  07/18/19 54  kg    Ideal Body Weight:  59.1 kg  BMI:  Body mass index is 19.21 kg/m.  Estimated Nutritional Needs:   Kcal:  1600-1800 kcal  Protein:  80-95 grams  Fluid:  >/= 1.6 L/day    Gaynell Face, MS, RD, LDN Inpatient Clinical Dietitian Pager: [phone] Weekend/After Hours: [phone]

## 2019-07-18 NOTE — Progress Notes (Addendum)
      [address]       [place] [address]             [phone]       6 Days Post-Op Procedure(s) (LRB): VIDEO ASSISTED THORACOSCOPY Pericardial window  (Right)  Subjective: Patient asking if needs to have dialysis. I explained lab results pending and nephrology would be by to discuss. She states her breathing is good.  Objective: Vital signs in last 24 hours: Temp:  [97.3 F (36.3 C)-97.5 F (36.4 C)] 97.5 F (36.4 C) (12/29 0300) Pulse Rate:  [66-77] 66 (12/28 2303) Cardiac Rhythm: Normal sinus rhythm (12/29 0400) Resp:  [12-21] 16 (12/29 0300) BP: (99-146)/(63-92) 113/74 (12/29 0300) SpO2:  [95 %-99 %] 97 % (12/28 2303) Weight:  [54 kg] 54 kg (12/29 0500)      Intake/Output from previous day: 12/28 0701 - 12/29 0700 In: 100 [P.O.:100] Out: 790 [Urine:550; Chest Tube:240]   Physical Exam:  Cardiovascular: RRR Pulmonary: Clear to auscultation on the left and diminished right base Abdomen: Soft, non tender, bowel sounds present. Extremities: Mild bilateral lower extremity edema. Wounds: Clean and dry.  No erythema or signs of infection. Chest Tube: to suction, no air leak, light yellow pleural fluid drainage  Lab Results: CBC:No results for input(s): WBC, HGB, HCT, PLT in the last 72 hours. BMET:  Recent Labs    07/16/19 0500 07/17/19 0420  NA 128* 127*  K 4.4 4.4  CL 89* 88*  CO2 26 23  GLUCOSE 90 89  BUN 111* 118*  CREATININE 4.19* 4.66*  CALCIUM 8.8* 8.7*    PT/INR: No results for input(s): LABPROT, INR in the last 72 hours. ABG:  INR: Will add last result for INR, ABG once components are confirmed Will add last 4 CBG results once components are confirmed  Assessment/Plan:  1. CV - SR. 2.  Pulmonary - On room air this am. Right chest tube with 240 cc last 24 hours, which is decreased from previously. CXR this am appears stable. Will discuss with Dr. Kipp Brood if able to remove right chest tube (output just under 250 cc  for 24 hours). Await final cytology/pathology results. 3. Anasarca-presumably due to liver disease which was diagnosed at [facility]. She has a history of liver steatosis. 4. Urinary retention-foley re inserted 5. AKI-Await this am's creatinine result . Nephrology following 6. Anemia-Last H and H 9.8 and 32.2 7. Hypervolemic hyponatremia-sodium yesterday [place] 07/18/2019,7:05 AM [phone]  Agree with above Will keep CT in until dialysis is initiated.  Joanna Ali Bary Leriche

## 2019-07-18 NOTE — Progress Notes (Signed)
Progress Note  [name] Date of Encounter: 07/18/2019  Primary Cardiologist: Evalina Field, MD   Subjective   68 year old female who was admitted with anasarca.  Echocardiogram has revealed mild LV dysfunction with an ejection fraction of 45% to 50%.  She has moderate to severe aortic insufficiency, moderate to severe tricuspid regurgitation, mild mitral regurgitation.  Heart catheterization on December 17 reveals moderate pulmonary hypertension with a PA pressure of 48/17 with a mean of 28.  Cardiac output was 5.0. She had a moderate sized pericardial effusion but there was no evidence of pericardial tamponade or constrictive physiology. She had a pericardial window on December 23 and appears to be doing well.  She has acute kidney failure.  Creatinine was 0.67 on April 04, 2019. Creatinine has increased to 5.1 this morning.  She continues to be hyponatremic. The plan is start dialysis today    Inpatient Medications    Scheduled Meds: . bisacodyl  10 mg Oral Daily  . carvedilol  3.125 mg Oral BID WC  . Chlorhexidine Gluconate Cloth  6 each Topical Daily  . darbepoetin (ARANESP) injection - NON-DIALYSIS  100 mcg Subcutaneous Q Fri-1800  . diphenhydrAMINE  25 mg Oral Once  . feeding supplement (ENSURE ENLIVE)  237 mL Oral TID BM  . gabapentin  100 mg Oral TID  . heparin  5,000 Units Subcutaneous Q8H  . mouth rinse  15 mL Mouth Rinse BID  . multivitamin with minerals  1 tablet Oral Daily  . senna-docusate  1 tablet Oral QHS  . sertraline  25 mg Oral BID  . sodium chloride flush  3 mL Intravenous Q12H  . sodium chloride flush  3 mL Intravenous Q12H  . torsemide  40 mg Oral Daily   Continuous Infusions: . sodium chloride 500 mL (07/06/19 1043)  . sodium chloride     PRN Meds: sodium chloride, sodium chloride, acetaminophen, camphor-menthol, hydrOXYzine, morphine injection, ondansetron (ZOFRAN) IV, oxyCODONE, polyvinyl alcohol, sodium chloride flush,  traMADol   Vital Signs    Vitals:   07/17/19 2303 07/18/19 0300 07/18/19 0500 07/18/19 0810  BP: 99/63 113/74  124/79  Pulse: 66   71  Resp: 12 16  15   Temp: (!) 97.3 F (36.3 C) (!) 97.5 F (36.4 C)  (!) 97.4 F (36.3 C)  TempSrc: Oral Oral  Oral  SpO2: 97%   97%  Weight:   54 kg   Height:        Intake/Output Summary (Last 24 hours) at 07/18/2019 0846 Last data filed at 07/18/2019 7026 Gross per 24 hour  Intake --  Output 730 ml  Net -730 ml   Last 3 Weights 07/18/2019 07/17/2019 07/16/2019  Weight (lbs) 119 lb 0.8 oz 124 lb 5.4 oz 121 lb 11.1 oz  Weight (kg) 54 kg 56.4 kg 55.2 kg      Telemetry    NSR  - Personally Reviewed  ECG      - Personally Reviewed  Physical Exam   Physical Exam: Blood pressure 124/79, pulse 71, temperature (!) 97.4 F (36.3 C), temperature source Oral, resp. rate 15, height 5' 6"  (1.676 m), weight 54 kg, SpO2 97 %.  GEN:   Elderly , chronically appearing female, she appears to be very weak. HEENT: Normal NECK: No JVD; No carotid bruits LYMPHATICS: No lymphadenopathy CARDIAC: Regular rate, soft systolic murmur.   RESPIRATORY: Reduced breath sounds bilaterally. ABDOMEN: Ascites.  Abdominal wall fluid. MUSCULOSKELETAL: Her arms have no edema.  Her legs have  2-3+ pitting edema. SKIN: Warm and dry NEUROLOGIC:  Alert and oriented x 3, she appears to be generally weak.  She needed assistance to sitting up in bed.   Labs    High Sensitivity Troponin:   Recent Labs  Lab 07/03/19 1739 07/03/19 1930  TROPONINIHS 149* 163*      Chemistry Recent Labs  Lab 07/14/19 0444 07/16/19 0500 07/17/19 0420 07/18/19 0500  NA 133* 128* 127* 124*  K 4.4 4.4 4.4 4.3  CL 91* 89* 88* 84*  CO2 25 26 23 22   GLUCOSE 103* 90 89 99  BUN 96* 111* 118* 128*  CREATININE 3.98* 4.19* 4.66* 5.10*  CALCIUM 9.1 8.8* 8.7* 8.7*  PROT 5.3*  --   --   --   ALBUMIN 2.8* 2.5* 2.5* 2.4*  AST 32  --   --   --   ALT 11  --   --   --   ALKPHOS 49  --   --    --   BILITOT 0.6  --   --   --   GFRNONAA 11* 10* 9* 8*  GFRAA 12* 12* 10* 9*  ANIONGAP 17* 13 16* 18*     Hematology Recent Labs  Lab 07/12/19 0258 07/12/19 1621 07/13/19 0450 07/14/19 0444  WBC 7.7  --  14.9* 11.1*  RBC 3.27*  --  3.67* 3.42*  HGB 9.4* 10.2* 10.4* 9.8*  HCT 29.0* 30.0* 33.5* 32.2*  MCV 88.7  --  91.3 94.2  MCH 28.7  --  28.3 28.7  MCHC 32.4  --  31.0 30.4  RDW 18.1*  --  18.7* 18.9*  PLT 238  --  263 245    BNPNo results for input(s): BNP, PROBNP in the last 168 hours.   DDimer No results for input(s): DDIMER in the last 168 hours.   Radiology    DG CHEST PORT 1 VIEW  Result Date: 07/18/2019 CLINICAL DATA:  Pleural effusion. Additional history provided: Chest tube present, pneumothorax, shortness of breath EXAM: PORTABLE CHEST 1 VIEW COMPARISON:  Chest radiograph 07/16/2019 FINDINGS: Right IJ approach central venous catheter and right basilar chest tube, unchanged. Overlying cardiac monitoring leads. Unchanged cardiomegaly. A previously demonstrated tiny right apical pneumothorax is not well appreciated on today's study. Unchanged hazy opacity at the right lung base consistent with small effusion with atelectasis and/or pneumonia. Persistent small to moderate left pleural effusion with left basilar atelectasis. IMPRESSION: Support apparatus unchanged. A previously demonstrated tiny right apical pneumothorax is not well appreciated on today's study. Unchanged small right effusion with right basilar atelectasis and/or pneumonia. Persistent small to moderate left pleural effusion with left basilar atelectasis. Electronically Signed   By: Kellie Simmering DO   On: 07/18/2019 08:09   ECHOCARDIOGRAM LIMITED  Result Date: 07/17/2019   ECHOCARDIOGRAM REPORT   Patient Name:   Joanna Ali Date of Exam: 07/17/2019 Medical Rec #:  [id number]    Height:       66.0 in Accession #:    [id number]   Weight:       124.3 lb Date of Birth:  07/25/1950    BSA:          1.63 m  Patient Age:    61 years     BP:           125/78 mmHg Patient Gender: F            HR:           71 bpm. Exam Location:  Inpatient Procedure: Limited Echo, Cardiac Doppler and Limited Color Doppler Indications:     I31.3 Pericardial effusion  History:         Patient has prior history of Echocardiogram examinations, most                  recent 07/09/2019. Signs/Symptoms:Dyspnea; Risk                  Factors:Hypertension.  Sonographer:     Tiffany Dance Referring Phys:  [id number] [place] Diagnosing Phys: Lyman Bishop MD IMPRESSIONS  1. Left ventricular ejection fraction, by visual estimation, is 50 to 55%. The left ventricle has low normal function. There is mildly increased left ventricular hypertrophy.  2. Moderate hypokinesis of the left ventricular, entire septal wall.  3. The left ventricle demonstrates regional wall motion abnormalities.  4. Global right ventricle has normal systolic function.The right ventricular size is normal. No increase in right ventricular wall thickness.  5. Left atrial size was normal.  6. Right atrial size was mildly dilated.  7. Moderate pleural effusion in the left lateral region.  8. The pericardial effusion is circumferential.  9. Trivial pericardial effusion is present. 10. The mitral valve is abnormal. Mild mitral valve regurgitation. 11. The tricuspid valve is grossly normal. Tricuspid valve regurgitation moderate. 12. Aortic valve regurgitation is moderate. 13. The aortic valve is tricuspid. Aortic valve regurgitation is moderate. Mild aortic valve sclerosis without stenosis. 14. The pulmonic valve was grossly normal. Pulmonic valve regurgitation is not visualized. 15. Moderately elevated pulmonary artery systolic pressure. 16. The inferior vena cava is dilated in size with >50% respiratory variability, suggesting right atrial pressure of 8 mmHg. FINDINGS  Left Ventricle: Left ventricular ejection fraction, by visual estimation, is 50 to 55%. The left ventricle  has low normal function. Moderate hypokinesis of the left ventricular, entire septal wall. The left ventricle demonstrates regional wall motion abnormalities. There is mildly increased left ventricular hypertrophy. Right Ventricle: The right ventricular size is normal. No increase in right ventricular wall thickness. Global RV systolic function is has normal systolic function. The tricuspid regurgitant velocity is 2.91 m/s, and with an assumed right atrial pressure  of 8 mmHg, the estimated right ventricular systolic pressure is moderately elevated at 42.0 mmHg. Left Atrium: Left atrial size was normal in size. Right Atrium: Right atrial size was mildly dilated Pericardium: Trivial pericardial effusion is present. The pericardial effusion is circumferential. There is a moderate pleural effusion in the left lateral region. Mitral Valve: The mitral valve is abnormal. There is mild thickening of the mitral valve leaflet(s). Mild mitral valve regurgitation. Tricuspid Valve: The tricuspid valve is grossly normal. Tricuspid valve regurgitation moderate. Aortic Valve: The aortic valve is tricuspid. Aortic valve regurgitation is moderate. Aortic regurgitation PHT measures 480 msec. Mild aortic valve sclerosis is present, with no evidence of aortic valve stenosis. Pulmonic Valve: The pulmonic valve was grossly normal. Pulmonic valve regurgitation is not visualized. Pulmonic regurgitation is not visualized. Aorta: The aortic root and ascending aorta are structurally normal, with no evidence of dilitation. Venous: The inferior vena cava is dilated in size with greater than 50% respiratory variability, suggesting right atrial pressure of 8 mmHg. IAS/Shunts: No atrial level shunt detected by color flow Doppler.  LEFT VENTRICLE PLAX 2D LVIDd:         4.70 cm LVIDs:         3.20 cm LV PW:         1.20 cm LV IVS:  0.80 cm LVOT diam:     1.90 cm LV SV:         61 ml LV SV Index:   37.99 LVOT Area:     2.84 cm  RIGHT VENTRICLE           IVC RV Basal diam:  3.30 cm  IVC diam: 2.10 cm RV Mid diam:    1.50 cm LEFT ATRIUM             Index       RIGHT ATRIUM           Index LA diam:        4.10 cm 2.51 cm/m  RA Area:     21.70 cm LA Vol (A2C):   71.5 ml 43.76 ml/m RA Volume:   59.90 ml  36.66 ml/m LA Vol (A4C):   47.6 ml 29.13 ml/m LA Biplane Vol: 62.6 ml 38.31 ml/m  AORTIC VALVE LVOT Vmax:   87.90 cm/s LVOT Vmean:  58.000 cm/s LVOT VTI:    0.165 m AI PHT:      480 msec  AORTA Ao Root diam: 3.90 cm Ao Asc diam:  3.70 cm MITRAL VALVE                        TRICUSPID VALVE MV Area (PHT): 4.49 cm             TR Peak grad:   34.0 mmHg MV PHT:        49.01 msec           TR Vmax:        306.00 cm/s MV Decel Time: 169 msec MV E velocity: 60.10 cm/s 103 cm/s  SHUNTS MV A velocity: 52.90 cm/s 70.3 cm/s Systemic VTI:  0.16 m MV E/A ratio:  1.14       1.5       Systemic Diam: 1.90 cm  Lyman Bishop MD Electronically signed by Lyman Bishop MD Signature Date/Time: 07/17/2019/4:21:31 PM    Final     Cardiac Studies     Patient Profile     68 y.o. female with lower body anasarca presumably due to liver disease.   Assessment & Plan    1.  Anasarca: The patient appears to have anasarca related to her liver disease.  She has significant edema in the lower half of her body but her upper body does not have any edema.  Right heart Cardiac cath showed moderate pulmonary hypertension but no evidence of cardiac tamponade or constrictive pericarditis.  Her ascites does not appear to be cardiac in origin.  Its most likely related to her liver disease with concomitant acute renal failure.  The plan is to start dialysis today.  2.  Pericardial effusion: The patient did not have any evidence of pericardial tamponade but did have a pericardial window on December 23 because of the presence of pericardial effusion as well as pleural effusions.  No new recommendations.  T CTS is following her chest tubes.  3.  Moderate aortic insufficiency: Stable.   No evidence of endocarditis.  4.  Acute renal failure: Creatinine continues to increase.  She is very weak.  She is to start dialysis today according to her conversation with nephrology.     For questions or updates, please contact [facility] Please consult [email or web address] for contact info under        Signed, Mertie Moores, MD  07/18/2019, 8:46 AM

## 2019-07-18 NOTE — H&P (Signed)
Chief Complaint: Patient was seen in consultation today for a tunneled hemodialysis catheter placement.  Referring Physician(s): Dr. Joelyn Oms  Supervising Physician: Sandi Mariscal  Patient Status: [facility] - In-pt  History of Present Illnes Joanna Ali is a 68 y.o. female with a past medical history significant for hepatic steatosis, HTN, peripheral neuropathy, anasarca, chronic pericardial effusion and moderate AVR who presented to [facility] ED on 07/03/19 with complaints of worsening dyspnea and palpitations. She was seen earlier that day by her cardiologist for these same symptoms and was noted to be very ill-appearing with sinus tachycardia and frequent PVCs, she was told to go to the ED for evaluation due to concern for PE. Initial workup in the ED showed 2-3 word dyspnea requiring supplemental oxygen, cyanosis of the tongue, lips and fingertips as well as bibasilar effusion with bronchiectactic changes on CXR. Her Covid test was negative. Initial labs notable for hgb 8.9, creatinine 1.3, Na+ 130, K+ 3.2, BNP 1124, troponin 149 >>163. A CTA PE study was performed which showed no evidence of PE. She was afebrile without leukocytosis. Cardiology was consulted and she was admitted for further evaluation and management. She has had a complicated hospital course including IV diuresis, thoracentesis yielding 700 mL straw colored fluid, left and right heart cath and VATS. She has been followed by nephrology due to worsening creatinine and poor urine output and as such decision has been made to proceed with hemodialysis. IR has been asked to place a tunneled HD catheter for HD access.  Joanna Ali is seen at bedside with her husband, she appears comfortable currently but complains of dry mouth and asking for ice or something to drink. She speaks very softly but is able to speak in full sentences. She defers most questions to her husband during my exam. She is not pleased that the HD catheter placement may be painful  but both she and her husband are agreeable to proceed with placement in order to begin HD today.    Past Medical History:  Diagnosis Date  . Hypertension   . Leg swelling 03/2019  . Short of breath on exertion 03/2019    Past Surgical History:  Procedure Laterality Date  . APPENDECTOMY    . NECK SURGERY    . RIGHT AND LEFT HEART CATH N/A 07/06/2019   Procedure: RIGHT AND LEFT HEART CATH;  Surgeon: Jolaine Artist, MD;  Location: [facility];  Service: Cardiovascular;  Laterality: N/A;  . TEE WITHOUT CARDIOVERSION N/A 03/28/2019   Procedure: TRANSESOPHAGEAL ECHOCARDIOGRAM (TEE);  Surgeon: Geralynn Rile, MD;  Location: [place];  Service: Cardiology;  Laterality: N/A;  . VIDEO ASSISTED THORACOSCOPY Right 07/12/2019   Procedure: VIDEO ASSISTED THORACOSCOPY Pericardial window ;  Surgeon: Lajuana Matte, MD;  Location: [place];  Service: Thoracic;  Laterality: Right;    Allergies: Patient has no known allergies.  Medications: Prior to Admission medications   Medication Sig Start Date End Date Taking? Authorizing Provider  gabapentin (NEURONTIN) 300 MG capsule Take 300 mg by mouth 3 (three) times daily.  05/24/19 07/04/19 Yes [provider]  Multiple Vitamins-Minerals (MULTIVITAMIN WITH MINERALS) tablet Take 1 tablet by mouth daily.   Yes [provider]  Multiple Vitamins-Minerals (PRESERVISION AREDS 2) CAPS Take 1 capsule by mouth 2 (two) times daily.    Yes [provider]  Propylene Glycol (SYSTANE BALANCE) 0.6 % SOLN Place 1 drop into both eyes 2 (two) times daily as needed (dry eyes).   Yes [provider]  senna (SENOKOT) 8.6 MG TABS tablet Take 1-3 tablets by mouth See admin instructions. Taking 1 tablet in the am and 2 tabs at bedtime   Yes [provider]  sertraline (ZOLOFT) 25 MG tablet Take 25 mg by mouth 2 (two) times daily.  05/17/19 05/16/20 Yes [provider]  spironolactone (ALDACTONE) 100 MG  tablet Take 100 mg by mouth daily.  04/19/19 05/22/20 Yes [provider]  torsemide (DEMADEX) 20 MG tablet Take 20 mg by mouth daily.  04/18/19 05/22/20 Yes [provider]  traMADol (ULTRAM) 50 MG tablet Take 50 mg by mouth every 6 (six) hours as needed for moderate pain.    Yes [provider]  TURMERIC PO Take 1 capsule by mouth daily.    Yes [provider]     Family History  Problem Relation Age of Onset  . Hypertension Mother     Social History   Socioeconomic History  . Marital status: Married    Spouse name: Not on file  . Number of children: Not on file  . Years of education: Not on file  . Highest education level: Not on file  Occupational History  . Not on file  Tobacco Use  . Smoking status: Former Smoker    Packs/day: 0.50    Years: 10.00    Pack years: 5.00    Types: Cigarettes    Quit date: 03/20/1979    Years since quitting: 40.3  . Smokeless tobacco: Never Used  . Tobacco comment: pt states she smoked from her early 21s to early 61s.  Substance and Sexual Activity  . Alcohol use: No  . Drug use: No  . Sexual activity: Not on file  Other Topics Concern  . Not on file  Social History Narrative  . Not on file   Social Determinants of Health   Financial Resource Strain:   . Difficulty of Paying Living Expenses: Not on file  Food Insecurity:   . Worried About Charity fundraiser in the Last Year: Not on file  . Ran Out of Food in the Last Year: Not on file  Transportation Needs:   . Lack of Transportation (Medical): Not on file  . Lack of Transportation (Non-Medical): Not on file  Physical Activity:   . Days of Exercise per Week: Not on file  . Minutes of Exercise per Session: Not on file  Stress:   . Feeling of Stress : Not on file  Social Connections:   . Frequency of Communication with Friends and Family: Not on file  . Frequency of Social Gatherings with Friends and Family: Not on file  . Attends Religious  Services: Not on file  . Active Member of Clubs or Organizations: Not on file  . Attends Archivist Meetings: Not on file  . Marital Status: Not on file     Review of Systems: A 12 point ROS discussed and pertinent positives are indicated in the HPI above.  All other systems are negative.  Review of Systems  Constitutional: Positive for appetite change and fatigue. Negative for chills and fever.  Respiratory: Positive for shortness of breath. Negative for cough.   Cardiovascular: Negative for chest pain.  Gastrointestinal: Negative for abdominal pain, diarrhea, nausea and vomiting.  Genitourinary: Positive for difficulty urinating.  Musculoskeletal: Positive for back pain.  Skin: Negative for rash and wound.  Neurological: Negative for dizziness and headaches.    Vital Signs: BP 120/80 (BP Location: Left Arm)   Pulse 70  Temp (!) 97.5 F (36.4 C) (Oral)   Resp 14   Ht 5' 6"  (1.676 m)   Wt 119 lb 0.8 oz (54 kg)   SpO2 97%   BMI 19.21 kg/m   Physical Exam Vitals and nursing note reviewed.  Constitutional:      General: She is not in acute distress.    Appearance: She is ill-appearing.     Comments: Cachectic appearing female, husband at bedside during exam.  HENT:     Head: Normocephalic.     Mouth/Throat:     Mouth: Mucous membranes are dry.     Pharynx: Oropharynx is clear. No oropharyngeal exudate or posterior oropharyngeal erythema.  Cardiovascular:     Rate and Rhythm: Normal rate and regular rhythm.     Comments: (+) right IJ central line Pulmonary:     Effort: Pulmonary effort is normal.     Comments: Decreased breath sounds bilaterally; poor inspiratory effort Abdominal:     Palpations: Abdomen is soft.  Skin:    General: Skin is warm and dry.  Neurological:     Mental Status: She is alert and oriented to person, place, and time.  Psychiatric:        Mood and Affect: Mood normal.        Behavior: Behavior normal.        Thought Content:  Thought content normal.        Judgment: Judgment normal.      MD Evaluation Airway: WNL Heart: WNL Heart  comments: Diastolic dysfunction Abdomen: WNL Chest/ Lungs: WNL ASA  Classification: 3 Mallampati/Airway Score: Two   Imaging: DG Chest 2 View  Result Date: 07/03/2019 CLINICAL DATA:  Shortness of breath worsening for 1 week, anasarca, liver disease EXAM: CHEST - 2 VIEW COMPARISON:  04/05/2019 FINDINGS: Enlargement of cardiac silhouette. Mediastinal contours and pulmonary vascularity normal. Peribronchial thickening and mild bibasilar atelectasis. No definite acute infiltrate or pneumothorax. Tiny bibasilar effusions blunt the posterior costophrenic angles. Mild scattered endplate spur formation thoracic spine. IMPRESSION: Bronchitic changes with mild bibasilar atelectasis and tiny pleural effusions. Electronically Signed   By: Lavonia Dana M.D.   On: 07/03/2019 18:06   CT Angio Chest PE W and/or Wo Contrast  Result Date: 07/03/2019 CLINICAL DATA:  PE suspected, high pretest probability. EXAM: CT ANGIOGRAPHY CHEST WITH CONTRAST TECHNIQUE: Multidetector CT imaging of the chest was performed using the standard protocol during bolus administration of intravenous contrast. Multiplanar CT image reconstructions and MIPs were obtained to evaluate the vascular anatomy. CONTRAST:  59m OMNIPAQUE IOHEXOL 350 MG/ML SOLN COMPARISON:  Chest radiograph 07/03/2019, CT chest 04/05/2019 FINDINGS: Cardiovascular: Satisfactory opacification the pulmonary arteries to the segmental level. No pulmonary artery filling defects are identified. Normal caliber central pulmonary arteries. No elevation of the RV/LV ratio (0.6). Cardiac size at the upper limits of normal. Moderate pericardial effusion. Thoracic aorta is normal caliber. Normal 3 vessel branching of the arch. Minimal atherosclerotic plaque throughout the aorta and branch vessels. Luminal evaluation precluded by suboptimal contrast opacification.  Mediastinum/Nodes: Scattered low-attenuation mediastinal and hilar nodes are present. No pathologically enlarged mediastinal, hilar or axillary lymph nodes are seen. Thyroid gland and thoracic inlet are unremarkable. No acute abnormality of the trachea or esophagus. Lungs/Pleura: Multifocal areas of interstitial and airspace opacity with a peripheral predominance seen throughout both lungs. Some interlobular septal thickening is noted in the lung apices. There are small moderate bilateral pleural effusions, right slightly greater than left with extension into the fissures. No pneumothorax. Upper Abdomen: No acute  abnormalities present in the visualized portions of the upper abdomen. Musculoskeletal: Multilevel degenerative changes are present in the imaged portions of the spine. Multilevel flowing anterior osteophytosis, compatible with features of diffuse idiopathic skeletal hyperostosis (DISH). Additional mild degenerative changes in the shoulders. Review of the MIP images confirms the above findings. IMPRESSION: 1. No evidence of pulmonary embolus. Multifocal areas of interstitial and airspace opacity with septal thickening bilateral pleural effusions, and pericardial effusion is worrisome for an acute infection process with superimposed edema/heart failure. 2. Scattered subcentimeter low-attenuation mediastinal and hilar adenopathy, likely reactive. 3.  Aortic Atherosclerosis (ICD10-I70.0). Electronically Signed   By: Lovena Le M.D.   On: 07/03/2019 20:11   NM CARDIAC AMYLOID TUMOR LOC INFLAM SPECT 1 DAY  Result Date: 07/05/2019 CLINICAL DATA:  HEART FAILURE. CONCERN FOR CARDIAC AMYLOIDOSIS.68 y.o. female with anasarca thought to be liver related with elevated pulmonary pressures normal EF chronic pericardial effusion pleural effusion negative PE increased dyspnea on exertion unremarkable thyroid function negative proteinuria EXAM: NUCLEAR MEDICINE TUMOR LOCALIZATION. PYP CARDIAC AMYLOIDOSIS SCAN WITH  SPECT TECHNIQUE: Following intravenous administration of radiopharmaceutical, anterior planar images of the chest were obtained. Regions of interest were placed on the heart and contralateral chest wall for quantitative assessment. Additional SPECT imaging of the chest was obtained. RADIOPHARMACEUTICALS:  20.7 mCi TECHNETIUM 99 PYROPHOSPHATE FINDINGS: Planar Visual assessment: Anterior planar imaging demonstrates radiotracer uptake within the heart less than uptake within the adjacent ribs (Grade 1). Quantitative assessment : Quantitative assessment of the cardiac uptake compared to the contralateral chest wall is equal to 1.16 (H/CL = 1.1). SPECT assessment: SPECT imaging of the chest demonstrates trace radiotracer accumulation within the LEFT ventricle. IMPRESSION: Visual and quantitative assessment (grade 1, H/CLL equal 1.16) are NOT suggestive of transthyretin amyloidosis. Electronically Signed   By: Suzy Bouchard M.D.   On: 07/05/2019 17:45   CARDIAC CATHETERIZATION  Result Date: 07/06/2019 Baseline Findings: RA = 7 RV = 42/7 PA = 48/17 (28) PCW = 16 LV = 122/8 Fick cardiac output/index = 5.0/3.2 PVR = 2.4 WU FA sat = 98% PA sat = 62%, 62% Prominent y-descents in RA tracing but no square root sign in ventricular tracings and no RV/LV interdeprendence After 350 cc NS bolus RA = 9 RV = 46/9 PA = 48/24 (33) PCW = 23 LV = 125/15 Prominent y-descents in RA tracing but no square root sign in ventricular tracings and no RV/LV interdeprendence Assessment: 1. Moderate pericardial effusion 2. Mild PAH 3. 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  D/w Dr. Marlou Porch. If effusion becomes hemodynamically significant in future can consider window. Glori Bickers, MD 11:55 AM   US RENAL  Result Date: 07/09/2019 CLINICAL  DATA:  Acute renal injury. EXAM: RENAL / URINARY TRACT ULTRASOUND COMPLETE COMPARISON:  None. FINDINGS: Right Kidney: Renal measurements: 10.9 x 3.9 x 5.8 cm = volume: 128.7 mL . Echogenicity within normal limits. No mass or hydronephrosis visualized. Left Kidney: Renal measurements: 10.7 x 4.7 x 5.9 cm = volume: 154.0 mL. Echogenicity within normal limits. No mass or hydronephrosis visualized. Bladder: Appears normal for degree of bladder distention. Other: None. IMPRESSION: Normal study.  No cause for acute renal injury identified. Electronically Signed   By: Dorise Bullion III M.D   On: 07/09/2019 15:34   DG CHEST PORT 1 VIEW  Result Date: 07/18/2019 CLINICAL DATA:  Pleural effusion. Additional  history provided: Chest tube present, pneumothorax, shortness of breath EXAM: PORTABLE CHEST 1 VIEW COMPARISON:  Chest radiograph 07/16/2019 FINDINGS: Right IJ approach central venous catheter and right basilar chest tube, unchanged. Overlying cardiac monitoring leads. Unchanged cardiomegaly. A previously demonstrated tiny right apical pneumothorax is not well appreciated on today's study. Unchanged hazy opacity at the right lung base consistent with small effusion with atelectasis and/or pneumonia. Persistent small to moderate left pleural effusion with left basilar atelectasis. IMPRESSION: Support apparatus unchanged. A previously demonstrated tiny right apical pneumothorax is not well appreciated on today's study. Unchanged small right effusion with right basilar atelectasis and/or pneumonia. Persistent small to moderate left pleural effusion with left basilar atelectasis. Electronically Signed   By: Kellie Simmering DO   On: 07/18/2019 08:09   DG CHEST PORT 1 VIEW  Result Date: 07/16/2019 CLINICAL DATA:  Cardiopulmonary status.  Follow-up surgery. EXAM: PORTABLE CHEST 1 VIEW COMPARISON:  07/15/2019 FINDINGS: Right IJ central venous catheter unchanged with tip at the cavoatrial junction. Right-sided chest tube  unchanged. Lungs are adequately inflated with stable opacification over the left mid to lower lung likely effusion with associated basilar atelectasis as infection over the mid to lower lung is possible. Stable hazy opacification over the right base. Small right effusion unchanged. Tiny right apical pneumothorax without significant change. Mild stable cardiomegaly. Remainder of the exam is unchanged. IMPRESSION: 1. Stable hazy opacification over the left mid to lower lung likely moderate size effusion with associated atelectasis. Infection over the mid to lower lung is possible. Stable hazy density over the right base with stable small right effusion. 2.  Stable tiny right apical pneumothorax. 3.  Tubes and lines as described. Electronically Signed   By: Marin Olp M.D.   On: 07/16/2019 08:29   DG CHEST PORT 1 VIEW  Result Date: 07/15/2019 CLINICAL DATA:  Post surgery. EXAM: PORTABLE CHEST 1 VIEW COMPARISON:  07/13/2019 FINDINGS: Right-sided chest tube unchanged. Right IJ central venous catheter unchanged with tip over the SVC. Lungs are adequately inflated demonstrate persistent hazy opacification over the left mid to lower lung and right base as findings may be due to infection. Moderate size left effusion and small right effusion are present. Small right apical pneumothorax which was not visualized on the prior exam. Stable cardiomegaly. Remainder of the exam is unchanged. IMPRESSION: 1. Stable hazy opacification over the left mid to lower lung and stable opacification over the right base as findings may be due to infection. Moderate size left effusion and small right effusion. 2. Small right apical pneumothorax with right-sided chest tube in place. Right IJ central venous catheter unchanged. These results will be called to the ordering clinician or representative by the Radiologist Assistant, and communication documented in the PACS or zVision Dashboard. Electronically Signed   By: Marin Olp M.D.    On: 07/15/2019 08:44   DG CHEST PORT 1 VIEW  Result Date: 07/13/2019 CLINICAL DATA:  Chest tube, chest pain EXAM: PORTABLE CHEST 1 VIEW COMPARISON:  07/12/2019 FINDINGS: Right chest tube remains in place without pneumothorax. Mediastinal, likely pericardial drain remains in place, unchanged. Cardiomegaly. Airspace disease in the left lower lobe again noted with small left effusion, unchanged. IMPRESSION: No significant change since prior study. Electronically Signed   By: Rolm Baptise M.D.   On: 07/13/2019 09:15   DG Chest Port 1 View  Result Date: 07/12/2019 CLINICAL DATA:  Post pericardial window EXAM: PORTABLE CHEST 1 VIEW COMPARISON:  Radiograph 07/09/2019 FINDINGS: A right IJ catheter terminates at the  level of the right atrium. There is slight postoperative mediastinal widening with placement of the mediastinal drain. New right chest tube is placed as well. There is increasing patchy opacity in the aerated portion of the left lung though much of this could be explained by layering pleural effusion. No acute osseous abnormality. Small amount of subcutaneous gas along the right chest wall, likely postprocedural. Additional support devices in telemetry leads overlie the chest. IMPRESSION: Slight postoperative mediastinal widening likely expected in the setting of pericardial window placement with a mediastinal drain in place. Increasing patchy opacity in the left lung may reflect a combination of atelectatic change and distributed pleural fluid. New right chest tube placed as well. Trace right effusion remains. Small amount of subcutaneous gas along the right chest wall is likely postprocedural Status factor positioning of a right IJ catheter. Electronically Signed   By: Lovena Le M.D.   On: 07/12/2019 20:02   DG CHEST PORT 1 VIEW  Result Date: 07/09/2019 CLINICAL DATA:  Pleural effusion EXAM: PORTABLE CHEST 1 VIEW COMPARISON:  July 06, 2019 FINDINGS: No pneumothorax. No change in  cardiomegaly. The hila and mediastinum are stable. Bilateral pulmonary opacities are stable. More focal opacity in the left retrocardiac region with a probable small associated effusion is stable. No other abnormalities. IMPRESSION: 1. No interval change in the small left effusion, focal left retrocardiac opacity, or diffuse interstitial opacities. Electronically Signed   By: Dorise Bullion III M.D   On: 07/09/2019 13:25   DG Chest Port 1 View  Result Date: 07/06/2019 CLINICAL DATA:  Shortness of breath with pleural effusion EXAM: PORTABLE CHEST 1 VIEW COMPARISON:  Yesterday FINDINGS: Improving pulmonary opacification although there is still interstitial and airspace opacity. Cardiopericardial enlargement with pericardial effusion by recent CT. There is also pleural effusions. No pneumothorax IMPRESSION: 1. Improving interstitial and airspace opacity, rapid changes likely related to improving edema. 2. Stable cardiopericardial enlargement. Left more than right pleural effusions. Electronically Signed   By: Monte Fantasia M.D.   On: 07/06/2019 07:50   DG Chest Port 1 View  Result Date: 07/05/2019 CLINICAL DATA:  Shortness of breath, respiratory failure EXAM: PORTABLE CHEST 1 VIEW COMPARISON:  07/04/2019 FINDINGS: Persistent bilateral opacities including dense retrocardiac consolidation. There is worsening lung aeration. Probable small bilateral pleural effusions. Stable cardiomediastinal contours. IMPRESSION: Bilateral opacities with worsening lung aeration since 07/04/2019 Electronically Signed   By: Macy Mis M.D.   On: 07/05/2019 09:12   DG Chest Port 1 View  Result Date: 07/04/2019 CLINICAL DATA:  Pneumothorax, history of hypertension EXAM: PORTABLE CHEST 1 VIEW COMPARISON:  07/04/2019 FINDINGS: Heart size remains enlarged. There is signs of bilateral pleural effusion. No signs of pneumothorax. Interstitial and airspace opacities throughout both right and left chest persist. Worsening  opacification particularly at the left lung base, to a lesser extent on the right. No acute bone finding. IMPRESSION: 1. Increasing basilar opacification may represent enlarging effusions are worsening consolidative changes particularly on the left. 2. Cardiomegaly as before. 3. No signs of pneumothorax. 4. Small bilateral effusions. Electronically Signed   By: Zetta Bills M.D.   On: 07/04/2019 20:39   DG Chest Port 1 View  Result Date: 07/04/2019 CLINICAL DATA:  Status post thoracentesis, bedside EXAM: PORTABLE CHEST 1 VIEW COMPARISON:  CT angiogram chest 07/03/2019, chest radiograph 07/03/2019. FINDINGS: Unchanged cardiomegaly. Interstitial and ill-defined airspace opacities throughout both lungs have increased from prior chest radiograph 07/03/2019. Persistent although decreased left basilar opacity which may reflect incomplete atelectasis, consolidation and/or minimal  residual pleural effusion. A curvilinear vertically oriented line projects over the left hemithorax. This may reflect a prominent skin fold (lung markings are seen distally) or pneumothorax. As per the radiographer, the ordering clinician is aware of this finding at the time of interpretation. No evidence of acute bony abnormality. Overlying cardiac monitoring leads. IMPRESSION: A curvilinear vertically oriented line projects over the left hemithorax, which may reflect a prominent skin fold (lung markings are seen distally) or pneumothorax. Patient repositioning with repeat radiograph recommended. Interstitial and ill-defined airspace opacities throughout both lungs have increased from prior chest radiograph 07/03/2019. Findings may reflect worsening pulmonary edema or infection. Persistent although decreased opacity at the left lung base which may reflect incomplete atelectasis, consolidation and/or minimal residual pleural effusion. Unchanged cardiomegaly. Electronically Signed   By: Kellie Simmering DO   On: 07/04/2019 14:13   DG Chest  Port 1V same Day  Result Date: 07/04/2019 CLINICAL DATA:  Follow-up for possible pneumothorax status post thoracentesis. EXAM: PORTABLE CHEST 1 VIEW COMPARISON:  Chest radiograph performed earlier the same day 07/04/2019, CT angiogram chest 07/03/2019. FINDINGS: Unchanged cardiomegaly. A previously questioned left-sided pneumothorax is not well appreciated on the current study. Persistent mild left basilar opacity which may reflect atelectasis, consolidation and/or small residual pleural effusion. Unchanged bilateral interstitial and ill-defined airspace opacities throughout both lungs. Findings may reflect pulmonary edema or infection. Overlying cardiac monitoring leads. IMPRESSION: A previously questioned left-sided pneumothorax is not well appreciated on the current study and may have reflected a skin fold. 6 hour chest x-ray follow-up is recommended for confirmation. Otherwise, no significant interval change as compared to chest radiograph performed earlier the same day at 1:36 p.m. Electronically Signed   By: Kellie Simmering DO   On: 07/04/2019 15:29   MR CARDIAC MORPHOLOGY WO CONTRAST  Result Date: 07/14/2019 CLINICAL DATA:  Evaluate AI, pericardial effusion, LVEF EXAM: CARDIAC MRI TECHNIQUE: The patient was scanned on a 1.5 Tesla Siemens magnet. A dedicated cardiac coil was used. Functional imaging was done using [employer] sequences. 2,3, and 4 chamber views were done to assess for RWMA's. Modified Simpson's rule using a short axis stack was used to calculate an ejection fraction on a dedicated work Conservation officer, nature. No contrast administered FINDINGS: Left ventricle: - Mild dilatation - Mild systolic dysfunction LV EF:  48% (Normal 56-78%) Absolute volumes: LV EDV: 144m (Normal 52-141 mL) LV ESV: 776m(Normal 13-51 mL) LV SV: 6675mNormal 33-97 mL) CO: 5.4L/min (Normal 2.7-6.0 L/min) Indexed volumes: LV EDV: 35m25m-m (Normal 41-81 mL/sq-m) LV ESV: 44mL47mm (Normal 12-21 mL/sq-m) LV SV:  41mL/46m (Normal 26-56 mL/sq-m) CI: 3.4L/min/sq-m (Normal 1.8-3.8 L/min/sq-m) Right ventricle: Moderate dilatation with mild systolic dysfunction RV EF: 46% (Normal 47-80%) Absolute volumes: RV EDV: 183mL (14mal 58-154 mL) RV ESV: 99mL (N8ml 12-68 mL) RV SV: 84mL (No63m 35-98 mL) CO: 6.9L/min (Normal 2.7-6 L/min) Indexed volumes: RV EDV: 114mL/sq-m42mrmal 48-87 mL/sq-m) RV ESV: 62mL/sq-m 67mmal 11-28 mL/sq-m) RV SV: 52mL/sq-m (62mal 27-57 mL/sq-m) CI: 4.3L/min/sq-m (Normal 1.8-3.8 L/min/sq-m) Left atrium: Moderate enlargement Right atrium: Mild enlargement Mitral valve: Mild regurgitation Aortic valve: Tricuspid. Unable to quantify regurgitation due to artifact in velocity-encoded imaging, but visually appears at least moderate Tricuspid valve: Mild regurgitation Pericardium: Large circumferential pericardial effusion. Measures up to 25mm adjacen90m anterior wall of LV. No evidence of ventricular interdependence on real time imaging. Extracardiac structures: Moderate bilateral pleural effusions IMPRESSION: 1. Large circumferential pericardial effusion. Measures up to 25mm adjacent68manterior wall of LV. 2. No evidence of ventricular interdependence  on real time imaging to suggest tamponade or constriction 3. Mild LV dilatation with mild systolic dysfunction (EF 08%) 4. Moderate RV dilatation with mild systolic dysfunction (EF 65%) 5. Unable to quantify aortic regurgitation due to artifact in velocity-encoded imaging, but visually appears at least moderate 6. Moderate bilateral pleural effusions 7. No gadolinium administered given renal dysfunction Electronically Signed   By: Oswaldo Milian MD   On: 07/14/2019 21:43   US LIVER DOPPLER  Result Date: 07/12/2019 CLINICAL DATA:  Acute renal injury, history of hepatic steatosis, pericardial effusion EXAM: DUPLEX ULTRASOUND OF LIVER TECHNIQUE: Color and duplex Doppler ultrasound was performed to evaluate the hepatic in-flow and out-flow vessels.  COMPARISON:  CT 04/05/2019 FINDINGS: Liver: Mildly echogenic parenchyma. Normal hepatic contour without nodularity. No focal lesion, mass or intrahepatic biliary ductal dilatation. Main Portal Vein size: 1.2 cm Portal Vein Velocities (all hepatopetal): Main Prox:  35 cm/sec Main Mid: 30 cm/sec Main Dist:  33 cm/sec Right: 29 cm/sec Left: 27 cm/sec Hepatic Vein Velocities (all hepatofugal): Right:  53 cm/sec Middle:  37 cm/sec Left:  38 cm/sec IVC: Present and patent with normal respiratory phasicity. IVC velocity: 102 cm/sec (probably overestimated due to poor angle correction) Hepatic Artery Velocity:  33 cm/sec Splenic Vein Velocity:  28 cm/sec Spleen: 8.1 cm x 11.4 cm x 4.2 cm with a total volume of 201 cm^3 (411 cm^3 is upper limit normal) Portal Vein Occlusion/Thrombus: No Splenic Vein Occlusion/Thrombus: No Ascites: Trace Varices: None identified Bilateral pleural effusions. IMPRESSION: 1. Unremarkable hepatic vascular Doppler evaluation. 2. Bilateral pleural effusions and trace abdominal ascites. Electronically Signed   By: Lucrezia Europe M.D.   On: 07/12/2019 08:56   ECHOCARDIOGRAM LIMITED  Result Date: 07/17/2019   ECHOCARDIOGRAM REPORT   Patient Name:   Joanna Ali Date of Exam: 07/17/2019 Medical Rec #:  [id number]    Height:       66.0 in Accession #:    [id number]   Weight:       124.3 lb Date of Birth:  04-04-1951    BSA:          1.63 m Patient Age:    62 years     BP:           125/78 mmHg Patient Gender: F            HR:           71 bpm. Exam Location:  Inpatient Procedure: Limited Echo, Cardiac Doppler and Limited Color Doppler Indications:     I31.3 Pericardial effusion  History:         Patient has prior history of Echocardiogram examinations, most                  recent 07/09/2019. Signs/Symptoms:Dyspnea; Risk                  Factors:Hypertension.  Sonographer:     Tiffany Dance Referring Phys:  [id number] [place] Diagnosing Phys: Lyman Bishop MD IMPRESSIONS  1. Left ventricular  ejection fraction, by visual estimation, is 50 to 55%. The left ventricle has low normal function. There is mildly increased left ventricular hypertrophy.  2. Moderate hypokinesis of the left ventricular, entire septal wall.  3. The left ventricle demonstrates regional wall motion abnormalities.  4. Global right ventricle has normal systolic function.The right ventricular size is normal. No increase in right ventricular wall thickness.  5. Left atrial size was normal.  6. Right atrial size was mildly dilated.  7.  Moderate pleural effusion in the left lateral region.  8. The pericardial effusion is circumferential.  9. Trivial pericardial effusion is present. 10. The mitral valve is abnormal. Mild mitral valve regurgitation. 11. The tricuspid valve is grossly normal. Tricuspid valve regurgitation moderate. 12. Aortic valve regurgitation is moderate. 13. The aortic valve is tricuspid. Aortic valve regurgitation is moderate. Mild aortic valve sclerosis without stenosis. 14. The pulmonic valve was grossly normal. Pulmonic valve regurgitation is not visualized. 15. Moderately elevated pulmonary artery systolic pressure. 16. The inferior vena cava is dilated in size with >50% respiratory variability, suggesting right atrial pressure of 8 mmHg. FINDINGS  Left Ventricle: Left ventricular ejection fraction, by visual estimation, is 50 to 55%. The left ventricle has low normal function. Moderate hypokinesis of the left ventricular, entire septal wall. The left ventricle demonstrates regional wall motion abnormalities. There is mildly increased left ventricular hypertrophy. Right Ventricle: The right ventricular size is normal. No increase in right ventricular wall thickness. Global RV systolic function is has normal systolic function. The tricuspid regurgitant velocity is 2.91 m/s, and with an assumed right atrial pressure  of 8 mmHg, the estimated right ventricular systolic pressure is moderately elevated at 42.0 mmHg. Left  Atrium: Left atrial size was normal in size. Right Atrium: Right atrial size was mildly dilated Pericardium: Trivial pericardial effusion is present. The pericardial effusion is circumferential. There is a moderate pleural effusion in the left lateral region. Mitral Valve: The mitral valve is abnormal. There is mild thickening of the mitral valve leaflet(s). Mild mitral valve regurgitation. Tricuspid Valve: The tricuspid valve is grossly normal. Tricuspid valve regurgitation moderate. Aortic Valve: The aortic valve is tricuspid. Aortic valve regurgitation is moderate. Aortic regurgitation PHT measures 480 msec. Mild aortic valve sclerosis is present, with no evidence of aortic valve stenosis. Pulmonic Valve: The pulmonic valve was grossly normal. Pulmonic valve regurgitation is not visualized. Pulmonic regurgitation is not visualized. Aorta: The aortic root and ascending aorta are structurally normal, with no evidence of dilitation. Venous: The inferior vena cava is dilated in size with greater than 50% respiratory variability, suggesting right atrial pressure of 8 mmHg. IAS/Shunts: No atrial level shunt detected by color flow Doppler.  LEFT VENTRICLE PLAX 2D LVIDd:         4.70 cm LVIDs:         3.20 cm LV PW:         1.20 cm LV IVS:        0.80 cm LVOT diam:     1.90 cm LV SV:         61 ml LV SV Index:   37.99 LVOT Area:     2.84 cm  RIGHT VENTRICLE          IVC RV Basal diam:  3.30 cm  IVC diam: 2.10 cm RV Mid diam:    1.50 cm LEFT ATRIUM             Index       RIGHT ATRIUM           Index LA diam:        4.10 cm 2.51 cm/m  RA Area:     21.70 cm LA Vol (A2C):   71.5 ml 43.76 ml/m RA Volume:   59.90 ml  36.66 ml/m LA Vol (A4C):   47.6 ml 29.13 ml/m LA Biplane Vol: 62.6 ml 38.31 ml/m  AORTIC VALVE LVOT Vmax:   87.90 cm/s LVOT Vmean:  58.000 cm/s LVOT VTI:  0.165 m AI PHT:      480 msec  AORTA Ao Root diam: 3.90 cm Ao Asc diam:  3.70 cm MITRAL VALVE                        TRICUSPID VALVE MV Area (PHT):  4.49 cm             TR Peak grad:   34.0 mmHg MV PHT:        49.01 msec           TR Vmax:        306.00 cm/s MV Decel Time: 169 msec MV E velocity: 60.10 cm/s 103 cm/s  SHUNTS MV A velocity: 52.90 cm/s 70.3 cm/s Systemic VTI:  0.16 m MV E/A ratio:  1.14       1.5       Systemic Diam: 1.90 cm  Lyman Bishop MD Electronically signed by Lyman Bishop MD Signature Date/Time: 07/17/2019/4:21:31 PM    Final    ECHOCARDIOGRAM LIMITED  Result Date: 07/09/2019   ECHOCARDIOGRAM LIMITED REPORT   Patient Name:   Joanna Ali Date of Exam: 07/09/2019 Medical Rec #:  [id number]    Height:       66.0 in Accession #:    [id number]   Weight:       118.6 lb Date of Birth:  [birth year]    BSA:          1.60 m Patient Age:    20 years     BP:           146/95 mmHg Patient Gender: F            HR:           88 bpm. Exam Location:  Inpatient  Procedure: Limited Echo, Color Doppler and Cardiac Doppler Indications:    I31.3 Pericardial effusion  History:        Patient has prior history of Echocardiogram examinations, most                 recent 07/04/2019.  Sonographer:    Raquel Sarna Senior RDCS Referring Phys: [id number] [place]  1. Left ventricular ejection fraction, by visual estimation, is 45 to 50%. The left ventricle has moderately decreased function. There is no left ventricular hypertrophy.  2. The left ventricle demonstrates global hypokinesis.  3. Global right ventricle was not assessed.The right ventricular size is not well visualized. Right vetricular wall thickness was not assessed.  4. Left atrial size was not assessed.  5. Moderate pericardial effusion.  6. The pericardial effusion is circumferential.  7. The mitral valve is grossly normal. Mild mitral valve regurgitation.  8. The tricuspid valve is grossly normal. Tricuspid valve regurgitation moderate-severe.  9. The tricuspid valve was normal in structure. Tricuspid valve regurgitation moderate-severe. 10. Aortic valve regurgitation is moderate to  severe. 11. The aortic valve is tricuspid. Aortic valve regurgitation is moderate to severe. 12. Aortic root could not be assessed. 13. Mildly elevated pulmonary artery systolic pressure. 14. The inferior vena cava is normal in size with <50% respiratory variability, suggesting right atrial pressure of 8 mmHg. 15. Limited echo. Effusion appears similar to just slightly enlarged from prior. Aortic regurgitation has been previously evaluated by TEE; incomplete evaluation on this study but cannot exclude severe AR based on images. 16. The interatrial septum was not assessed. FINDINGS  Left Ventricle: Left ventricular ejection fraction, by visual estimation, is 45 to 50%. The  left ventricle has moderately decreased function. The left ventricle demonstrates global hypokinesis. Right Ventricle: The right ventricular size is not assessed. Right vetricular wall thickness was not assessed. Global RV systolic function is was not assessed. The tricuspid regurgitant velocity is 2.57 m/s, and with an assumed right atrial pressure of 8  mmHg, the estimated right ventricular systolic pressure is mildly elevated at 34.4 mmHg. Left Atrium: Left atrial size was not assessed. Right Atrium: Right atrial size was not assessed. Right atrial pressure is estimated at 8 mmHg. Pericardium: A moderately sized pericardial effusion is present is seen. A moderately sized pericardial effusion is present. The pericardial effusion is circumferential. Tricuspid Valve: The tricuspid valve is normal in structure. Tricuspid valve regurgitation moderate-severe. Aortic Valve: The aortic valve is tricuspid. Aortic valve regurgitation is moderate to severe. Pulmonic Valve: The pulmonic valve was grossly normal. Pulmonic valve regurgitation is mild to moderate by color flow Doppler. Pulmonic regurgitation is mild to moderate by color flow Doppler. Aorta: Aortic root could not be assessed. Venous: The inferior vena cava is normal in size with less than 50%  respiratory variability, suggesting right atrial pressure of 8 mmHg. Shunts: The interatrial septum was not assessed.   LV Volumes (MOD)             Normals LV area d, A2C:    23.00 cm LV area d, A4C:    25.40 cm LV area s, A2C:    18.30 cm LV area s, A4C:    17.90 cm LV major d, A2C:   6.58 cm LV major d, A4C:   6.80 cm LV major s, A2C:   6.41 cm LV major s, A4C:   6.25 cm LV vol d, MOD A2C: 69.4 ml   68 ml LV vol d, MOD A4C: 79.1 ml LV vol s, MOD A2C: 44.5 ml   24 ml LV vol s, MOD A4C: 44.5 ml LV SV MOD A2C:     24.9 ml LV SV MOD A4C:     79.1 ml LV SV MOD BP:      29.9 ml   45 ml RIGHT VENTRICLE RV S prime:     8.92 cm/s TAPSE (M-mode): 1.8 cm TRICUSPID VALVE             Normals TR Peak grad:   26.4 mmHg TR Vmax:        257.00 cm/s 288 cm/s  Buford Dresser MD Electronically signed by Buford Dresser MD Signature Date/Time: 07/09/2019/5:23:10 PM    Final    ECHOCARDIOGRAM LIMITED  Result Date: 07/04/2019   ECHOCARDIOGRAM LIMITED REPORT   Patient Name:   Joanna Ali Date of Exam: 07/03/2019 Medical Rec #:  [id number]    Height:       66.0 in Accession #:    [id number]   Weight:       117.0 lb Date of Birth:  1951-04-15    BSA:          1.59 m Patient Age:    69 years     BP:           175/111 mmHg Patient Gender: F            HR:           108 bpm. Exam Location:  Inpatient  Procedure: Limited Echo, Limited Color Doppler and Cardiac Doppler                     STAT ECHO Reported to: Dr.  Marletta Lor on 07/03/2019 10:40:00 PM. Indications:     pericardial effusion  History:         Patient has prior history of Echocardiogram examinations, most                  recent 03/28/2019.  Sonographer:     Johny Chess Referring Phys:  [id number] Milus Banister Diagnosing Phys: Sanda Klein MD IMPRESSIONS  1. Mild global left ventricular hypokinesis with LV EF approximately 50%.  2. The left ventricle has no regional wall motion abnormalities.  3. Left ventricular diastolic parameters are consistent with  Grade II diastolic dysfunction (pseudonormalization).  4. Elevated left atrial pressure.  5. Moderate circumferential pericardial effusion.  6. There is inversion of the right atrial wall. No other signs of tamponade are identified.  7. The tricuspid valve is grossly normal. Tricuspid valve regurgitation is moderate.  8. The pulmonic valve was not well visualized. Pulmonic valve regurgitation is not visualized.  9. Severely elevated pulmonary artery systolic pressure. 10. The tricuspid regurgitant velocity is 3.85 m/s, and with an assumed right atrial pressure of 8 mmHg, the estimated right ventricular systolic pressure is severely elevated at 67.3 mmHg. 11. The inferior vena cava is normal in size with <50% respiratory variability, suggesting right atrial pressure of 8 mmHg. 12. Aortic valve regurgitation not evaluated. 13. The aortic valve is grossly normal. Aortic valve regurgitation not evaluated. 14. Left ventricular ejection fraction, by visual estimation, is 50 to 55%. The left ventricle has low normal function. There is no left ventricular hypertrophy. 15. The mitral valve is grossly normal. Moderate mitral valve regurgitation. 16. Global right ventricle has normal systolic function.The right ventricular size is normal. No increase in right ventricular wall thickness. 51. Compared to 04/05/2019, the left ventricular systolic function has diminished. The pericardial effusion is unchanged in size and there are still only incomplete findings to support tamponade. Mitral and tricuspid insufficiency are worse. The degree of pulmonary hypertension appears similar. 18. The diagnosis of pericardial tamponade has reduced sensitivity in the setting of severe pulmonary hypertension. Invasive hemodynamic assessment may be appropriate. FINDINGS  Left Ventricle: Left ventricular ejection fraction, by visual estimation, is 50 to 55%. The left ventricle has low normal function. The left ventricle has no regional wall motion  abnormalities. The left ventricular internal cavity size was the left ventricle is normal in size. There is no increased left ventricular wall thickness. Left ventricular diastolic parameters are consistent with Grade II diastolic dysfunction (pseudonormalization). Elevated left atrial pressure. Mild global left ventricular hypokinesis with LV EF approximately 50%. Right Ventricle: The right ventricular size is normal. No increase in right ventricular wall thickness. Global RV systolic function is has normal systolic function. The tricuspid regurgitant velocity is 3.85 m/s, and with an assumed right atrial pressure  of 8 mmHg, the estimated right ventricular systolic pressure is severely elevated at 67.3 mmHg. Left Atrium: Left atrial size was normal in size. Right Atrium: Right atrial size was normal in size. Right atrial pressure is estimated at 8 mmHg. Pericardium: A moderately sized pericardial effusion is present. The pericardial effusion is circumferential. There is inversion of the right atrial wall. Mitral Valve: The mitral valve is normal in structure. Moderate mitral valve regurgitation, with centrally-directed jet. Tricuspid Valve: The tricuspid valve is grossly normal. Tricuspid valve regurgitation moderate. Aortic Valve: The aortic valve is grossly normal. The aortic valve is normal in structure. Aortic valve regurgitation not evaluated. Pulmonic Valve: The pulmonic valve was not well visualized. Pulmonic valve  regurgitation is not visualized. Aorta: The aortic root is normal in size and structure. Venous: The inferior vena cava is normal in size with less than 50% respiratory variability, suggesting right atrial pressure of 8 mmHg. Additional Comments: The diagnosis of pericardial tamponade has reduced sensitivity in the setting of severe pulmonary hypertension. Invasive hemodynamic assessment may be appropriate.  LEFT VENTRICLE         Normals PLAX 2D LVIDd:         5.09 cm 3.6 cm LVIDs:         3.56  cm 1.7 cm LV PW:         1.14 cm 1.4 cm LV IVS:        1.02 cm 1.3 cm LV SV:         70 ml   79 ml LV SV Index:   44.77   45 ml/m2  LEFT ATRIUM         Index LA diam:    3.55 cm 2.23 cm/m  TRICUSPID VALVE             Normals TR Peak grad:   59.3 mmHg TR Vmax:        385.00 cm/s 288 cm/s  Dani Gobble Croitoru MD Electronically signed by Sanda Klein MD Signature Date/Time: 07/04/2019/8:55:54 AMThe mitral valve is normal in structure.    Final (Updated)     Labs:  CBC: Recent Labs    07/11/19 0355 07/12/19 0258 07/12/19 1621 07/13/19 0450 07/14/19 0444  WBC 7.6 7.7  --  14.9* 11.1*  HGB 6.8* 9.4* 10.2* 10.4* 9.8*  HCT 22.3* 29.0* 30.0* 33.5* 32.2*  PLT 220 238  --  263 245    COAGS: Recent Labs    03/23/19 1145 04/04/19 2000 04/07/19 0251 07/03/19 1930 07/12/19 0258  INR 1.1 1.2 1.2 1.2 1.2  APTT 28  --   --   --   --     BMP: Recent Labs    07/15/19 0344 07/16/19 0500 07/17/19 0420 07/18/19 0500  NA 133* 128* 127* 124*  K 4.5 4.4 4.4 4.3  CL 92* 89* 88* 84*  CO2 26 26 23 22   GLUCOSE 98 90 89 99  BUN 105* 111* 118* 128*  CALCIUM 8.8* 8.8* 8.7* 8.7*  CREATININE 4.15* 4.19* 4.66* 5.10*  GFRNONAA 10* 10* 9* 8*  GFRAA 12* 12* 10* 9*    LIVER FUNCTION TESTS: Recent Labs    07/03/19 1930 07/04/19 0822 07/04/19 1424 07/09/19 0347 07/14/19 0444 07/15/19 0344 07/16/19 0500 07/17/19 0420 07/18/19 0500  BILITOT 1.1 0.7  --  0.9 0.6  --   --   --   --   AST 41 40  --  37 32  --   --   --   --   ALT 18 18  --  16 11  --   --   --   --   ALKPHOS 51 46  --  57 49  --   --   --   --   PROT 6.4* 5.7* 6.2* 5.5* 5.3*  --   --   --   --   ALBUMIN 3.1* 2.9* 2.9* 2.6* 2.8* 2.6* 2.5* 2.5* 2.4*    TUMOR MARKERS: No results for input(s): AFPTM, CEA, CA199, CHROMGRNA in the last 8760 hours.  Assessment and Plan:  68 y/o F with complicated past medical history as per HPI now with AKI followed by nephrology who plans to initiate HD today. IR has been asked  to place a  tunneled HD catheter.  Risks and benefits discussed with the patient including, but not limited to bleeding, infection, vascular injury, pneumothorax which may require chest tube placement, air embolism or even death.  All of the patient's questions were answered, patient is agreeable to proceed.  Consent signed and in chart.  Thank you for this interesting consult.  I greatly enjoyed meeting Joanna Ali and look forward to participating in their care.  A copy of this report was sent to the requesting provider on this date.  Electronically Signed: Joaquim Nam, PA-C 07/18/2019, 12:36 PM   I spent a total of 40 Minutes  in face to face in clinical consultation, greater than 50% of which was counseling/coordinating care for tunneled HD catheter placement.

## 2019-07-18 NOTE — Procedures (Signed)
Pre-procedure Diagnosis: ESRD Post-procedure Diagnosis: Same  Successful placement of tunneled HD catheter with tips terminating within the superior aspect of the right atrium.    Complications: None Immediate EBL: Trace  The catheter is ready for immediate use.   Ronny Bacon, MD Pager #: [phone]

## 2019-07-19 ENCOUNTER — Inpatient Hospital Stay (HOSPITAL_COMMUNITY): Payer: Medicare Other

## 2019-07-19 DIAGNOSIS — L8990 Pressure ulcer of unspecified site, unspecified stage: Secondary | ICD-10-CM | POA: Diagnosis present

## 2019-07-19 LAB — RENAL FUNCTION PANEL
Albumin: 2.3 g/dL — ABNORMAL LOW (ref 3.5–5.0)
Anion gap: 14 (ref 5–15)
BUN: 81 mg/dL — ABNORMAL HIGH (ref 8–23)
CO2: 24 mmol/L (ref 22–32)
Calcium: 8.5 mg/dL — ABNORMAL LOW (ref 8.9–10.3)
Chloride: 94 mmol/L — ABNORMAL LOW (ref 98–111)
Creatinine, Ser: 4.04 mg/dL — ABNORMAL HIGH (ref 0.44–1.00)
GFR calc Af Amer: 12 mL/min — ABNORMAL LOW (ref >60)
GFR calc non Af Amer: 11 mL/min — ABNORMAL LOW (ref >60)
Glucose, Bld: 92 mg/dL (ref 70–99)
Phosphorus: 5 mg/dL — ABNORMAL HIGH (ref 2.5–4.6)
Potassium: 3.8 mmol/L (ref 3.5–5.1)
Sodium: 132 mmol/L — ABNORMAL LOW (ref 135–145)

## 2019-07-19 LAB — PREALBUMIN: Prealbumin: 11.3 mg/dL — ABNORMAL LOW (ref 18–38)

## 2019-07-19 MED ORDER — GERHARDT'S BUTT CREAM
TOPICAL_CREAM | Freq: Three times a day (TID) | CUTANEOUS | Status: DC
  Administered 2019-07-21 – 2019-08-21 (×9): 1 via TOPICAL
  Filled 2019-07-19: qty 1

## 2019-07-19 MED ORDER — POLYETHYLENE GLYCOL 3350 17 G PO PACK
17.0000 g | PACK | Freq: Every day | ORAL | Status: DC
  Administered 2019-07-20 – 2019-07-21 (×2): 17 g via ORAL
  Filled 2019-07-19 (×3): qty 1

## 2019-07-19 MED ORDER — SORBITOL 70 % SOLN
20.0000 mL | Freq: Once | Status: AC
  Administered 2019-07-19: 13:00:00 20 mL via ORAL
  Filled 2019-07-19: qty 30

## 2019-07-19 MED ORDER — SENNOSIDES-DOCUSATE SODIUM 8.6-50 MG PO TABS
1.0000 | ORAL_TABLET | Freq: Every evening | ORAL | Status: DC | PRN
  Filled 2019-07-19: qty 1

## 2019-07-19 MED ORDER — COLLAGENASE 250 UNIT/GM EX OINT
TOPICAL_OINTMENT | Freq: Two times a day (BID) | CUTANEOUS | Status: DC
  Administered 2019-07-19 – 2019-08-21 (×8): 1 via TOPICAL
  Filled 2019-07-19 (×2): qty 30

## 2019-07-19 MED ORDER — METOCLOPRAMIDE HCL 5 MG/ML IJ SOLN: 10.0000 mg | Freq: Four times a day (QID) | INTRAMUSCULAR | Status: DC

## 2019-07-19 MED ORDER — MINERAL OIL RE ENEM
1.0000 | ENEMA | Freq: Once | RECTAL | Status: AC
  Administered 2019-07-19: 1 via RECTAL
  Filled 2019-07-19: qty 1

## 2019-07-19 MED ORDER — METOCLOPRAMIDE HCL 5 MG/ML IJ SOLN
5.0000 mg | Freq: Four times a day (QID) | INTRAMUSCULAR | Status: AC
  Administered 2019-07-19 (×2): 5 mg via INTRAVENOUS
  Filled 2019-07-19 (×2): qty 2

## 2019-07-19 MED ORDER — WITCH HAZEL-GLYCERIN EX PADS
MEDICATED_PAD | CUTANEOUS | Status: DC | PRN
  Filled 2019-07-19 (×2): qty 100

## 2019-07-19 NOTE — Progress Notes (Signed)
Orders placed to pull chest tube. RN verified with Dr. Kipp Brood okay to pull chest verse pulling himself. Dr. Kipp Brood advised RN okay to pull chest tube and place Vaseline gauze, cover with gauze and tape. RN placed pt left side advise patient to breathe deep in through nose and out through mouth for a few repetitions. RN advise pt to hold breath. Chest tube was pulled. Patient tolerated fair. Vaseline gauze, plain gauze and tape applied to site with pressure. RN will continue to monitor patient.

## 2019-07-19 NOTE — Progress Notes (Signed)
Progress Note  [name] Date of Encounter: 07/19/2019  Primary Cardiologist: Evalina Field, MD   Subjective   68 year old female who was admitted with anasarca.  Echocardiogram has revealed mild LV dysfunction with an ejection fraction of 45% to 50%.  She has moderate to severe aortic insufficiency, moderate to severe tricuspid regurgitation, mild mitral regurgitation.  Heart catheterization on December 17 reveals moderate pulmonary hypertension with a PA pressure of 48/17 with a mean of 28.  Cardiac output was 5.0. She had a moderate sized pericardial effusion but there was no evidence of pericardial tamponade or constrictive physiology. She had a pericardial window on December 23 and appears to be doing well.  Joanna Ali started dialysis yesterday.  Her creatinine is down slightly. Biopsy results from the pericardial window show an inflammatory process.  No evidence of malignancy.  She  remains very weak.  She complains of severe abdominal pain due to constipation.  Inpatient Medications    Scheduled Meds:  bisacodyl  10 mg Oral Daily   carvedilol  3.125 mg Oral BID WC   Chlorhexidine Gluconate Cloth  6 each Topical Daily   darbepoetin (ARANESP) injection - NON-DIALYSIS  100 mcg Subcutaneous Q Fri-1800   diphenhydrAMINE  25 mg Oral Once   feeding supplement (ENSURE ENLIVE)  237 mL Oral TID BM   gabapentin  100 mg Oral TID   heparin  5,000 Units Subcutaneous Q8H   mouth rinse  15 mL Mouth Rinse BID   metoCLOPramide (REGLAN) injection  5 mg Intravenous Q6H   mineral oil  1 enema Rectal Once   polyethylene glycol  17 g Oral Daily   sertraline  25 mg Oral BID   sodium chloride flush  3 mL Intravenous Q12H   sodium chloride flush  3 mL Intravenous Q12H   sorbitol  20 mL Oral Once   torsemide  40 mg Oral Daily   Continuous Infusions:  sodium chloride 500 mL (07/06/19 1043)   sodium chloride     feeding supplement (VITAL 1.5 CAL)      PRN Meds: sodium chloride, sodium chloride, acetaminophen, camphor-menthol, hydrOXYzine, morphine injection, ondansetron (ZOFRAN) IV, oxyCODONE, polyvinyl alcohol, senna-docusate, sodium chloride flush, traMADol, witch hazel-glycerin   Vital Signs    Vitals:   07/19/19 0002 07/19/19 0311 07/19/19 0349 07/19/19 0716  BP:  129/81  115/74  Pulse:  92  73  Resp:  19  15  Temp: 98.7 F (37.1 C)  98.4 F (36.9 C) (!) 97.3 F (36.3 C)  TempSrc: Oral  Oral Axillary  SpO2:  96%  97%  Weight:   51.6 kg   Height:        Intake/Output Summary (Last 24 hours) at 07/19/2019 0942 Last data filed at 07/19/2019 0425 Gross per 24 hour  Intake 290 ml  Output 900 ml  Net -610 ml   Last 3 Weights 07/19/2019 07/18/2019 07/18/2019  Weight (lbs) 113 lb 12.1 oz 119 lb 0.8 oz 119 lb 0.8 oz  Weight (kg) 51.6 kg 54 kg 54 kg      Telemetry    NSR  - Personally Reviewed  ECG      - Personally Reviewed  Physical Exam   Physical Exam: Blood pressure 115/74, pulse 73, temperature (!) 97.3 F (36.3 C), temperature source Axillary, resp. rate 15, height _0  (1.676 m), weight 51.6 kg, SpO2 97 %.  GEN:   Chronically ill-appearing female, moderate distress due to abdominal pain. HEENT: Normal NECK: No JVD; No  carotid bruits LYMPHATICS: No lymphadenopathy CARDIAC: Regular rate S1-S2.  She still has her chest tube in place. RESPIRATORY:  Clear to auscultation without rales, wheezing or rhonchi  ABDOMEN: Soft, non-tender, non-distended MUSCULOSKELETAL: Less edema in her legs.  Her arms have no edema. SKIN: Warm and dry NEUROLOGIC:  Alert and oriented x 3   Labs    High Sensitivity Troponin:   Recent Labs  Lab 07/03/19 1739 07/03/19 1930  TROPONINIHS 149* 163*      Chemistry Recent Labs  Lab 07/14/19 0444 07/17/19 0420 07/18/19 0500 07/19/19 0425  NA 133* 127* 124* 132*  K 4.4 4.4 4.3 3.8  CL 91* 88* 84* 94*  CO2 _0 GLUCOSE 103* 89 99 92  BUN 96* 118* 128* 81*   CREATININE 3.98* 4.66* 5.10* 4.04*  CALCIUM 9.1 8.7* 8.7* 8.5*  PROT 5.3*  --   --   --   ALBUMIN 2.8* 2.5* 2.4* 2.3*  AST 32  --   --   --   ALT 11  --   --   --   ALKPHOS 49  --   --   --   BILITOT 0.6  --   --   --   GFRNONAA 11* 9* 8* 11*  GFRAA 12* 10* 9* 12*  ANIONGAP 17* 16* 18* 14     Hematology Recent Labs  Lab 07/12/19 1621 07/13/19 0450 07/14/19 0444  WBC  --  14.9* 11.1*  RBC  --  3.67* 3.42*  HGB 10.2* 10.4* 9.8*  HCT 30.0* 33.5* 32.2*  MCV  --  91.3 94.2  MCH  --  28.3 28.7  MCHC  --  31.0 30.4  RDW  --  18.7* 18.9*  PLT  --  263 245    BNPNo results for input(s): BNP, PROBNP in the last 168 hours.   DDimer No results for input(s): DDIMER in the last 168 hours.   Radiology    DG Chest 2 View  Result Date: 07/19/2019 CLINICAL DATA:  Pleural effusion, pneumothorax, chest tube EXAM: CHEST - 2 VIEW COMPARISON:  07/18/2019 FINDINGS: Left dialysis catheter has been placed with the tip in the right atrium. Right central line and right chest tube remain in place, unchanged. No pneumothorax. Cardiomegaly. Small bilateral pleural effusions. Bilateral lower lobe airspace opacities, unchanged. IMPRESSION: No pneumothorax. Continued bilateral lower lobe airspace opacities concerning for pneumonia. Small effusions. Electronically Signed   By: Rolm Baptise M.D.   On: 07/19/2019 08:34   IR Fluoro Guide CV Line Left  Result Date: 07/18/2019 INDICATION: End-stage renal disease. In need of durable intravenous access for the initiation of dialysis. EXAM: TUNNELED CENTRAL VENOUS HEMODIALYSIS CATHETER PLACEMENT WITH ULTRASOUND AND FLUOROSCOPIC GUIDANCE MEDICATIONS: Ancef 2 gm IV . The antibiotic was given in an appropriate time interval prior to skin puncture. ANESTHESIA/SEDATION: Versed 1 mg IV; Fentanyl 50 mcg IV; Moderate Sedation Time:  15 minutes The patient was continuously monitored during the procedure by the interventional radiology nurse under my direct supervision.  FLUOROSCOPY TIME:  30 seconds (2 mGy) COMPLICATIONS: None immediate. PROCEDURE: Informed written consent was obtained from the patient after a discussion of the risks, benefits, and alternatives to treatment. Questions regarding the procedure were encouraged and answered. Given the presence of the existing right jugular approach central venous catheter, the decision was made to place a left internal jugular approach hemodialysis catheter. As such, the left neck and chest were prepped with chlorhexidine in a sterile fashion, and a sterile drape  was applied covering the operative field. Maximum barrier sterile technique with sterile gowns and gloves were used for the procedure. A timeout was performed prior to the initiation of the procedure. After creating a small venotomy incision, a micropuncture kit was utilized to access the internal jugular vein. Real-time ultrasound guidance was utilized for vascular access including the acquisition of a permanent ultrasound image documenting patency of the accessed vessel. The microwire was utilized to measure appropriate catheter length. A stiff Glidewire was advanced to the level of the IVC and the micropuncture sheath was exchanged for a peel-away sheath. A palindrome tunneled hemodialysis catheter measuring 23 cm from tip to cuff was tunneled in a retrograde fashion from the anterior chest wall to the venotomy incision. The catheter was then placed through the peel-away sheath with tips ultimately positioned within the superior aspect of the right atrium. Final catheter positioning was confirmed and documented with a spot radiographic image. The catheter aspirates and flushes normally. The catheter was flushed with appropriate volume heparin dwells. The catheter exit site was secured with a 0-Prolene retention suture. The venotomy incision was closed with an interrupted 4-0 Vicryl, Dermabond and Steri-strips. Dressings were applied. The patient tolerated the procedure well  without immediate post procedural complication. IMPRESSION: Successful placement of 23 cm tip to cuff tunneled hemodialysis catheter via the left internal jugular vein with tips terminating within the superior aspect of the right atrium. The catheter is ready for immediate use. Electronically Signed   By: Sandi Mariscal M.D.   On: 07/18/2019 14:15   IR US Guide Vasc Access Left  Result Date: 07/18/2019 INDICATION: End-stage renal disease. In need of durable intravenous access for the initiation of dialysis. EXAM: TUNNELED CENTRAL VENOUS HEMODIALYSIS CATHETER PLACEMENT WITH ULTRASOUND AND FLUOROSCOPIC GUIDANCE MEDICATIONS: Ancef 2 gm IV . The antibiotic was given in an appropriate time interval prior to skin puncture. ANESTHESIA/SEDATION: Versed 1 mg IV; Fentanyl 50 mcg IV; Moderate Sedation Time:  15 minutes The patient was continuously monitored during the procedure by the interventional radiology nurse under my direct supervision. FLUOROSCOPY TIME:  30 seconds (2 mGy) COMPLICATIONS: None immediate. PROCEDURE: Informed written consent was obtained from the patient after a discussion of the risks, benefits, and alternatives to treatment. Questions regarding the procedure were encouraged and answered. Given the presence of the existing right jugular approach central venous catheter, the decision was made to place a left internal jugular approach hemodialysis catheter. As such, the left neck and chest were prepped with chlorhexidine in a sterile fashion, and a sterile drape was applied covering the operative field. Maximum barrier sterile technique with sterile gowns and gloves were used for the procedure. A timeout was performed prior to the initiation of the procedure. After creating a small venotomy incision, a micropuncture kit was utilized to access the internal jugular vein. Real-time ultrasound guidance was utilized for vascular access including the acquisition of a permanent ultrasound image documenting  patency of the accessed vessel. The microwire was utilized to measure appropriate catheter length. A stiff Glidewire was advanced to the level of the IVC and the micropuncture sheath was exchanged for a peel-away sheath. A palindrome tunneled hemodialysis catheter measuring 23 cm from tip to cuff was tunneled in a retrograde fashion from the anterior chest wall to the venotomy incision. The catheter was then placed through the peel-away sheath with tips ultimately positioned within the superior aspect of the right atrium. Final catheter positioning was confirmed and documented with a spot radiographic image. The catheter aspirates  and flushes normally. The catheter was flushed with appropriate volume heparin dwells. The catheter exit site was secured with a 0-Prolene retention suture. The venotomy incision was closed with an interrupted 4-0 Vicryl, Dermabond and Steri-strips. Dressings were applied. The patient tolerated the procedure well without immediate post procedural complication. IMPRESSION: Successful placement of 23 cm tip to cuff tunneled hemodialysis catheter via the left internal jugular vein with tips terminating within the superior aspect of the right atrium. The catheter is ready for immediate use. Electronically Signed   By: Sandi Mariscal M.D.   On: 07/18/2019 14:15   DG CHEST PORT 1 VIEW  Result Date: 07/18/2019 CLINICAL DATA:  Pleural effusion. Additional history provided: Chest tube present, pneumothorax, shortness of breath EXAM: PORTABLE CHEST 1 VIEW COMPARISON:  Chest radiograph 07/16/2019 FINDINGS: Right IJ approach central venous catheter and right basilar chest tube, unchanged. Overlying cardiac monitoring leads. Unchanged cardiomegaly. A previously demonstrated tiny right apical pneumothorax is not well appreciated on today's study. Unchanged hazy opacity at the right lung base consistent with small effusion with atelectasis and/or pneumonia. Persistent small to moderate left pleural  effusion with left basilar atelectasis. IMPRESSION: Support apparatus unchanged. A previously demonstrated tiny right apical pneumothorax is not well appreciated on today's study. Unchanged small right effusion with right basilar atelectasis and/or pneumonia. Persistent small to moderate left pleural effusion with left basilar atelectasis. Electronically Signed   By: Kellie Simmering DO   On: 07/18/2019 08:09   ECHOCARDIOGRAM LIMITED  Result Date: 07/17/2019   ECHOCARDIOGRAM REPORT   Patient Name:   JOHNISHA LOUKS Date of Exam: 07/17/2019 Medical Rec #:  [id number]    Height:       66.0 in Accession #:    [id number]   Weight:       124.3 lb Date of Birth:  [birth year]    BSA:          1.63 m Patient Age:    10 years     BP:           125/78 mmHg Patient Gender: F            HR:           71 bpm. Exam Location:  Inpatient Procedure: Limited Echo, Cardiac Doppler and Limited Color Doppler Indications:     I31.3 Pericardial effusion  History:         Patient has prior history of Echocardiogram examinations, most                  recent 07/09/2019. Signs/Symptoms:Dyspnea; Risk                  Factors:Hypertension.  Sonographer:     Tiffany Dance Referring Phys:  [id number] Daisy Diagnosing Phys: Lyman Bishop MD IMPRESSIONS  1. Left ventricular ejection fraction, by visual estimation, is 50 to 55%. The left ventricle has low normal function. There is mildly increased left ventricular hypertrophy.  2. Moderate hypokinesis of the left ventricular, entire septal wall.  3. The left ventricle demonstrates regional wall motion abnormalities.  4. Global right ventricle has normal systolic function.The right ventricular size is normal. No increase in right ventricular wall thickness.  5. Left atrial size was normal.  6. Right atrial size was mildly dilated.  7. Moderate pleural effusion in the left lateral region.  8. The pericardial effusion is circumferential.  9. Trivial pericardial effusion is present. 10. The  mitral valve is abnormal. Mild mitral valve regurgitation. 11. The tricuspid  valve is grossly normal. Tricuspid valve regurgitation moderate. 12. Aortic valve regurgitation is moderate. 13. The aortic valve is tricuspid. Aortic valve regurgitation is moderate. Mild aortic valve sclerosis without stenosis. 14. The pulmonic valve was grossly normal. Pulmonic valve regurgitation is not visualized. 15. Moderately elevated pulmonary artery systolic pressure. 16. The inferior vena cava is dilated in size with >50% respiratory variability, suggesting right atrial pressure of 8 mmHg. FINDINGS  Left Ventricle: Left ventricular ejection fraction, by visual estimation, is 50 to 55%. The left ventricle has low normal function. Moderate hypokinesis of the left ventricular, entire septal wall. The left ventricle demonstrates regional wall motion abnormalities. There is mildly increased left ventricular hypertrophy. Right Ventricle: The right ventricular size is normal. No increase in right ventricular wall thickness. Global RV systolic function is has normal systolic function. The tricuspid regurgitant velocity is 2.91 m/s, and with an assumed right atrial pressure  of 8 mmHg, the estimated right ventricular systolic pressure is moderately elevated at 42.0 mmHg. Left Atrium: Left atrial size was normal in size. Right Atrium: Right atrial size was mildly dilated Pericardium: Trivial pericardial effusion is present. The pericardial effusion is circumferential. There is a moderate pleural effusion in the left lateral region. Mitral Valve: The mitral valve is abnormal. There is mild thickening of the mitral valve leaflet(s). Mild mitral valve regurgitation. Tricuspid Valve: The tricuspid valve is grossly normal. Tricuspid valve regurgitation moderate. Aortic Valve: The aortic valve is tricuspid. Aortic valve regurgitation is moderate. Aortic regurgitation PHT measures 480 msec. Mild aortic valve sclerosis is present, with no evidence  of aortic valve stenosis. Pulmonic Valve: The pulmonic valve was grossly normal. Pulmonic valve regurgitation is not visualized. Pulmonic regurgitation is not visualized. Aorta: The aortic root and ascending aorta are structurally normal, with no evidence of dilitation. Venous: The inferior vena cava is dilated in size with greater than 50% respiratory variability, suggesting right atrial pressure of 8 mmHg. IAS/Shunts: No atrial level shunt detected by color flow Doppler.  LEFT VENTRICLE PLAX 2D LVIDd:         4.70 cm LVIDs:         3.20 cm LV PW:         1.20 cm LV IVS:        0.80 cm LVOT diam:     1.90 cm LV SV:         61 ml LV SV Index:   37.99 LVOT Area:     2.84 cm  RIGHT VENTRICLE          IVC RV Basal diam:  3.30 cm  IVC diam: 2.10 cm RV Mid diam:    1.50 cm LEFT ATRIUM             Index       RIGHT ATRIUM           Index LA diam:        4.10 cm 2.51 cm/m  RA Area:     21.70 cm LA Vol (A2C):   71.5 ml 43.76 ml/m RA Volume:   59.90 ml  36.66 ml/m LA Vol (A4C):   47.6 ml 29.13 ml/m LA Biplane Vol: 62.6 ml 38.31 ml/m  AORTIC VALVE LVOT Vmax:   87.90 cm/s LVOT Vmean:  58.000 cm/s LVOT VTI:    0.165 m AI PHT:      480 msec  AORTA Ao Root diam: 3.90 cm Ao Asc diam:  3.70 cm MITRAL VALVE  TRICUSPID VALVE MV Area (PHT): 4.49 cm             TR Peak grad:   34.0 mmHg MV PHT:        49.01 msec           TR Vmax:        306.00 cm/s MV Decel Time: 169 msec MV E velocity: 60.10 cm/s 103 cm/s  SHUNTS MV A velocity: 52.90 cm/s 70.3 cm/s Systemic VTI:  0.16 m MV E/A ratio:  1.14       1.5       Systemic Diam: 1.90 cm  Lyman Bishop MD Electronically signed by Lyman Bishop MD Signature Date/Time: 07/17/2019/4:21:31 PM    Final     Cardiac Studies     Patient Profile     68 y.o. female with lower body anasarca presumably due to liver disease.   Assessment & Plan    1.  Anasarca: The patient appears to have anasarca related to her liver disease.   She appears to have some  underlying inflammatory process associated with liver disease and pan serositis.  She still has chest tube in place.  This is being managed by CT surgery. It appears that her anasarca is not related to congestive heart failure but is more related to this diffuse inflammatory process.  2.  Pericardial effusion:   Chest tube is still in place.  Further management per CT surgery.  3.  Moderate aortic insufficiency: Stable.  No evidence of endocarditis.  4.  Acute renal failure: She started dialysis yesterday.  Creatinine is improved.   For questions or updates, please contact Ryan Please consult [email or web address] for contact info under        Signed, Mertie Moores, MD  07/19/2019, 9:42 AM

## 2019-07-19 NOTE — Progress Notes (Addendum)
      [address]       [place] [address]             [phone]       7 Days Post-Op Procedure(s) (LRB): VIDEO ASSISTED THORACOSCOPY Pericardial window  (Right)  Subjective: Patient with nausea, that she states started yesterday. She also says Senokot is not helping and she does not want it every night.  Objective: Vital signs in last 24 hours: Temp:  [97.4 F (36.3 C)-98.7 F (37.1 C)] 98.4 F (36.9 C) (12/30 0349) Pulse Rate:  [69-92] 92 (12/30 0311) Cardiac Rhythm: Normal sinus rhythm (12/30 0700) Resp:  [9-20] 19 (12/30 0311) BP: (106-134)/(70-92) 129/81 (12/30 0311) SpO2:  [95 %-100 %] 96 % (12/30 0311) Weight:  [51.6 kg-54 kg] 51.6 kg (12/30 0349)      Intake/Output from previous day: 12/29 0701 - 12/30 0700 In: 290 [P.O.:240; I.V.:50] Out: 910 [Urine:850; Chest Tube:60]   Physical Exam:  Cardiovascular: RRR Pulmonary: Clear to auscultation on the left and slightly diminished right base Abdomen: Soft, non tender, bowel sounds present. Extremities: Mild bilateral lower extremity edema. Wounds: Clean and dry.  No erythema or signs of infection. Chest Tube: to suction, no air leak, light yellow pleural fluid drainage  Lab Results: CBC:No results for input(s): WBC, HGB, HCT, PLT in the last 72 hours. BMET:  Recent Labs    07/18/19 0500 07/19/19 0425  NA 124* 132*  K 4.3 3.8  CL 84* 94*  CO2 22 24  GLUCOSE 99 92  BUN 128* 81*  CREATININE 5.10* 4.04*  CALCIUM 8.7* 8.5*    PT/INR: No results for input(s): LABPROT, INR in the last 72 hours. ABG:  INR: Will add last result for INR, ABG once components are confirmed Will add last 4 CBG results once components are confirmed  Assessment/Plan:  1. CV - SR. Moderate AI on echocardiogram. 2.  Pulmonary - On room air this am. Right chest tube with 60 cc last 24 hours, which is very decreased from previously. CXR this am appears stable. Will  chest tube. Final cytology results:Reactive  mesothelial cells present 3. Anasarca-presumably due to liver disease which was diagnosed at [facility]. She has a history of liver steatosis. 4. Urinary retention-foley re inserted 5. AKI-S/p tunneled HD catheter by IR 12/29. Had HD yesterday. Creatinine this am 4.04. Nephrology following 6. Anemia-Last H and H 9.8 and 32.2 7. Hypervolemic hyponatremia-sodium this am up to 132 8. GI-Reglan, Zofran PRN nausea. Etiology likely multifactorial Sorbitol later today  Donielle M ZimmermanPA-C 07/19/2019,7:14 AM [phone]  Agree with above Will remove chest tube today  Aaniya Sterba O Janin Kozlowski

## 2019-07-19 NOTE — Progress Notes (Signed)
[facility] NEPHROLOGY PROGRESS NOTE  Assessment/ Plan: Pt is a 68 y.o. yo female  with history of HTN, former smoker, hepatic steatosis with low albumin, chronic pericardial effusion, moderate AR admitted on 12/17 for progressive dyspnea, anasarca and fatigue, seen as a consultation for AKI and fluid overload.  #Acute kidney injury, nonoliguric (crt normal in Sept, was 1.43 on 07/03/19) : Likely hemodynamically mediated concomitant with use of IV contrast- U/A not remarkable.  Autoimmune work-up unremarkable in the past. No improvement despite Foley catheter.  Started HD 07/18/19 with IR placed [facility] for progressive uremia.  Today some UOP, will follow AM labs and UOP tomorrow and make day by day HD decision.   #Hyponatremia, hypervolemic: improved with HD yesterday, CTM  #Anasarca presumably due to liver disease which was diagnosed at [facility].  She has a history of liver steatosis. No evidence of GN/nephrotic syndrome. 3rd spacing also. Remains on diuretics for now  #Moderate to large pericardial effusion:   s/p pericardial window on 12/23- TCTS following, stable  #Hypertension: Blood pressure acceptable.  On low dose Coreg. . . #Anemia: s/p transfusion on 12/22.  gave feraheme and one dose of ESA on 12/23- stable  # Constipation: will order mineral oil enema and miralax. Avoid FLEETs and MOM products  Subjective:    C/o constipation  HD#1 yesterday after TDC with IR  0.9L UOP, an increase  Objective Vital signs in last 24 hours: Vitals:   07/19/19 0002 07/19/19 0311 07/19/19 0349 07/19/19 0716  BP:  129/81  115/74  Pulse:  92  73  Resp:  19  15  Temp: 98.7 F (37.1 C)  98.4 F (36.9 C) (!) 97.3 F (36.3 C)  TempSrc: Oral  Oral Axillary  SpO2:  96%  97%  Weight:   51.6 kg   Height:       Weight change: 0 kg  Intake/Output Summary (Last 24 hours) at 07/19/2019 1122 Last data filed at 07/19/2019 0425 Gross per 24 hour  Intake 290 ml  Output 900 ml   Net -610 ml       Labs: Basic Metabolic Panel: Recent Labs  Lab 07/17/19 0420 07/18/19 0500 07/19/19 0425  NA 127* 124* 132*  K 4.4 4.3 3.8  CL 88* 84* 94*  CO2 _0 GLUCOSE 89 99 92  BUN 118* 128* 81*  CREATININE 4.66* 5.10* 4.04*  CALCIUM 8.7* 8.7* 8.5*  PHOS 7.4* 7.5* 5.0*   Liver Function Tests: Recent Labs  Lab 07/14/19 0444 07/17/19 0420 07/18/19 0500 07/19/19 0425  AST 32  --   --   --   ALT 11  --   --   --   ALKPHOS 49  --   --   --   BILITOT 0.6  --   --   --   PROT 5.3*  --   --   --   ALBUMIN 2.8* 2.5* 2.4* 2.3*   No results for input(s): LIPASE, AMYLASE in the last 168 hours. No results for input(s): AMMONIA in the last 168 hours. CBC: Recent Labs  Lab 07/12/19 1621 07/13/19 0450 07/14/19 0444  WBC  --  14.9* 11.1*  HGB 10.2* 10.4* 9.8*  HCT 30.0* 33.5* 32.2*  MCV  --  91.3 94.2  PLT  --  263 245   Cardiac Enzymes: No results for input(s): CKTOTAL, CKMB, CKMBINDEX, TROPONINI in the last 168 hours. CBG: No results for input(s): GLUCAP in the last 168 hours.  Iron Studies:  No  results for input(s): IRON, TIBC, TRANSFERRIN, FERRITIN in the last 72 hours. Studies/Results: DG Chest 2 View  Result Date: 07/19/2019 CLINICAL DATA:  Pleural effusion, pneumothorax, chest tube EXAM: CHEST - 2 VIEW COMPARISON:  07/18/2019 FINDINGS: Left dialysis catheter has been placed with the tip in the right atrium. Right central line and right chest tube remain in place, unchanged. No pneumothorax. Cardiomegaly. Small bilateral pleural effusions. Bilateral lower lobe airspace opacities, unchanged. IMPRESSION: No pneumothorax. Continued bilateral lower lobe airspace opacities concerning for pneumonia. Small effusions. Electronically Signed   By: Rolm Baptise M.D.   On: 07/19/2019 08:34   IR Fluoro Guide CV Line Left  Result Date: 07/18/2019 INDICATION: End-stage renal disease. In need of durable intravenous access for the initiation of dialysis. EXAM:  TUNNELED CENTRAL VENOUS HEMODIALYSIS CATHETER PLACEMENT WITH ULTRASOUND AND FLUOROSCOPIC GUIDANCE MEDICATIONS: Ancef 2 gm IV . The antibiotic was given in an appropriate time interval prior to skin puncture. ANESTHESIA/SEDATION: Versed 1 mg IV; Fentanyl 50 mcg IV; Moderate Sedation Time:  15 minutes The patient was continuously monitored during the procedure by the interventional radiology nurse under my direct supervision. FLUOROSCOPY TIME:  30 seconds (2 mGy) COMPLICATIONS: None immediate. PROCEDURE: Informed written consent was obtained from the patient after a discussion of the risks, benefits, and alternatives to treatment. Questions regarding the procedure were encouraged and answered. Given the presence of the existing right jugular approach central venous catheter, the decision was made to place a left internal jugular approach hemodialysis catheter. As such, the left neck and chest were prepped with chlorhexidine in a sterile fashion, and a sterile drape was applied covering the operative field. Maximum barrier sterile technique with sterile gowns and gloves were used for the procedure. A timeout was performed prior to the initiation of the procedure. After creating a small venotomy incision, a micropuncture kit was utilized to access the internal jugular vein. Real-time ultrasound guidance was utilized for vascular access including the acquisition of a permanent ultrasound image documenting patency of the accessed vessel. The microwire was utilized to measure appropriate catheter length. A stiff Glidewire was advanced to the level of the IVC and the micropuncture sheath was exchanged for a peel-away sheath. A palindrome tunneled hemodialysis catheter measuring 23 cm from tip to cuff was tunneled in a retrograde fashion from the anterior chest wall to the venotomy incision. The catheter was then placed through the peel-away sheath with tips ultimately positioned within the superior aspect of the right  atrium. Final catheter positioning was confirmed and documented with a spot radiographic image. The catheter aspirates and flushes normally. The catheter was flushed with appropriate volume heparin dwells. The catheter exit site was secured with a 0-Prolene retention suture. The venotomy incision was closed with an interrupted 4-0 Vicryl, Dermabond and Steri-strips. Dressings were applied. The patient tolerated the procedure well without immediate post procedural complication. IMPRESSION: Successful placement of 23 cm tip to cuff tunneled hemodialysis catheter via the left internal jugular vein with tips terminating within the superior aspect of the right atrium. The catheter is ready for immediate use. Electronically Signed   By: Sandi Mariscal M.D.   On: 07/18/2019 14:15   IR US Guide Vasc Access Left  Result Date: 07/18/2019 INDICATION: End-stage renal disease. In need of durable intravenous access for the initiation of dialysis. EXAM: TUNNELED CENTRAL VENOUS HEMODIALYSIS CATHETER PLACEMENT WITH ULTRASOUND AND FLUOROSCOPIC GUIDANCE MEDICATIONS: Ancef 2 gm IV . The antibiotic was given in an appropriate time interval prior to skin puncture. ANESTHESIA/SEDATION: Versed  1 mg IV; Fentanyl 50 mcg IV; Moderate Sedation Time:  15 minutes The patient was continuously monitored during the procedure by the interventional radiology nurse under my direct supervision. FLUOROSCOPY TIME:  30 seconds (2 mGy) COMPLICATIONS: None immediate. PROCEDURE: Informed written consent was obtained from the patient after a discussion of the risks, benefits, and alternatives to treatment. Questions regarding the procedure were encouraged and answered. Given the presence of the existing right jugular approach central venous catheter, the decision was made to place a left internal jugular approach hemodialysis catheter. As such, the left neck and chest were prepped with chlorhexidine in a sterile fashion, and a sterile drape was applied  covering the operative field. Maximum barrier sterile technique with sterile gowns and gloves were used for the procedure. A timeout was performed prior to the initiation of the procedure. After creating a small venotomy incision, a micropuncture kit was utilized to access the internal jugular vein. Real-time ultrasound guidance was utilized for vascular access including the acquisition of a permanent ultrasound image documenting patency of the accessed vessel. The microwire was utilized to measure appropriate catheter length. A stiff Glidewire was advanced to the level of the IVC and the micropuncture sheath was exchanged for a peel-away sheath. A palindrome tunneled hemodialysis catheter measuring 23 cm from tip to cuff was tunneled in a retrograde fashion from the anterior chest wall to the venotomy incision. The catheter was then placed through the peel-away sheath with tips ultimately positioned within the superior aspect of the right atrium. Final catheter positioning was confirmed and documented with a spot radiographic image. The catheter aspirates and flushes normally. The catheter was flushed with appropriate volume heparin dwells. The catheter exit site was secured with a 0-Prolene retention suture. The venotomy incision was closed with an interrupted 4-0 Vicryl, Dermabond and Steri-strips. Dressings were applied. The patient tolerated the procedure well without immediate post procedural complication. IMPRESSION: Successful placement of 23 cm tip to cuff tunneled hemodialysis catheter via the left internal jugular vein with tips terminating within the superior aspect of the right atrium. The catheter is ready for immediate use. Electronically Signed   By: Sandi Mariscal M.D.   On: 07/18/2019 14:15   DG CHEST PORT 1 VIEW  Result Date: 07/18/2019 CLINICAL DATA:  Pleural effusion. Additional history provided: Chest tube present, pneumothorax, shortness of breath EXAM: PORTABLE CHEST 1 VIEW COMPARISON:   Chest radiograph 07/16/2019 FINDINGS: Right IJ approach central venous catheter and right basilar chest tube, unchanged. Overlying cardiac monitoring leads. Unchanged cardiomegaly. A previously demonstrated tiny right apical pneumothorax is not well appreciated on today's study. Unchanged hazy opacity at the right lung base consistent with small effusion with atelectasis and/or pneumonia. Persistent small to moderate left pleural effusion with left basilar atelectasis. IMPRESSION: Support apparatus unchanged. A previously demonstrated tiny right apical pneumothorax is not well appreciated on today's study. Unchanged small right effusion with right basilar atelectasis and/or pneumonia. Persistent small to moderate left pleural effusion with left basilar atelectasis. Electronically Signed   By: Kellie Simmering DO   On: 07/18/2019 08:09   ECHOCARDIOGRAM LIMITED  Result Date: 07/17/2019   ECHOCARDIOGRAM REPORT   Patient Name:   Joanna Ali Date of Exam: 07/17/2019 Medical Rec #:  [id number]    Height:       66.0 in Accession #:    [id number]   Weight:       124.3 lb Date of Birth:  Jul 11, 1951    BSA:  1.63 m Patient Age:    74 years     BP:           125/78 mmHg Patient Gender: F            HR:           71 bpm. Exam Location:  Inpatient Procedure: Limited Echo, Cardiac Doppler and Limited Color Doppler Indications:     I31.3 Pericardial effusion  History:         Patient has prior history of Echocardiogram examinations, most                  recent 07/09/2019. Signs/Symptoms:Dyspnea; Risk                  Factors:Hypertension.  Sonographer:     Tiffany Dance Referring Phys:  [id number] [place] Diagnosing Phys: Lyman Bishop MD IMPRESSIONS  1. Left ventricular ejection fraction, by visual estimation, is 50 to 55%. The left ventricle has low normal function. There is mildly increased left ventricular hypertrophy.  2. Moderate hypokinesis of the left ventricular, entire septal wall.  3. The left  ventricle demonstrates regional wall motion abnormalities.  4. Global right ventricle has normal systolic function.The right ventricular size is normal. No increase in right ventricular wall thickness.  5. Left atrial size was normal.  6. Right atrial size was mildly dilated.  7. Moderate pleural effusion in the left lateral region.  8. The pericardial effusion is circumferential.  9. Trivial pericardial effusion is present. 10. The mitral valve is abnormal. Mild mitral valve regurgitation. 11. The tricuspid valve is grossly normal. Tricuspid valve regurgitation moderate. 12. Aortic valve regurgitation is moderate. 13. The aortic valve is tricuspid. Aortic valve regurgitation is moderate. Mild aortic valve sclerosis without stenosis. 14. The pulmonic valve was grossly normal. Pulmonic valve regurgitation is not visualized. 15. Moderately elevated pulmonary artery systolic pressure. 16. The inferior vena cava is dilated in size with >50% respiratory variability, suggesting right atrial pressure of 8 mmHg. FINDINGS  Left Ventricle: Left ventricular ejection fraction, by visual estimation, is 50 to 55%. The left ventricle has low normal function. Moderate hypokinesis of the left ventricular, entire septal wall. The left ventricle demonstrates regional wall motion abnormalities. There is mildly increased left ventricular hypertrophy. Right Ventricle: The right ventricular size is normal. No increase in right ventricular wall thickness. Global RV systolic function is has normal systolic function. The tricuspid regurgitant velocity is 2.91 m/s, and with an assumed right atrial pressure  of 8 mmHg, the estimated right ventricular systolic pressure is moderately elevated at 42.0 mmHg. Left Atrium: Left atrial size was normal in size. Right Atrium: Right atrial size was mildly dilated Pericardium: Trivial pericardial effusion is present. The pericardial effusion is circumferential. There is a moderate pleural effusion in the  left lateral region. Mitral Valve: The mitral valve is abnormal. There is mild thickening of the mitral valve leaflet(s). Mild mitral valve regurgitation. Tricuspid Valve: The tricuspid valve is grossly normal. Tricuspid valve regurgitation moderate. Aortic Valve: The aortic valve is tricuspid. Aortic valve regurgitation is moderate. Aortic regurgitation PHT measures 480 msec. Mild aortic valve sclerosis is present, with no evidence of aortic valve stenosis. Pulmonic Valve: The pulmonic valve was grossly normal. Pulmonic valve regurgitation is not visualized. Pulmonic regurgitation is not visualized. Aorta: The aortic root and ascending aorta are structurally normal, with no evidence of dilitation. Venous: The inferior vena cava is dilated in size with greater than 50% respiratory variability, suggesting right atrial  pressure of 8 mmHg. IAS/Shunts: No atrial level shunt detected by color flow Doppler.  LEFT VENTRICLE PLAX 2D LVIDd:         4.70 cm LVIDs:         3.20 cm LV PW:         1.20 cm LV IVS:        0.80 cm LVOT diam:     1.90 cm LV SV:         61 ml LV SV Index:   37.99 LVOT Area:     2.84 cm  RIGHT VENTRICLE          IVC RV Basal diam:  3.30 cm  IVC diam: 2.10 cm RV Mid diam:    1.50 cm LEFT ATRIUM             Index       RIGHT ATRIUM           Index LA diam:        4.10 cm 2.51 cm/m  RA Area:     21.70 cm LA Vol (A2C):   71.5 ml 43.76 ml/m RA Volume:   59.90 ml  36.66 ml/m LA Vol (A4C):   47.6 ml 29.13 ml/m LA Biplane Vol: 62.6 ml 38.31 ml/m  AORTIC VALVE LVOT Vmax:   87.90 cm/s LVOT Vmean:  58.000 cm/s LVOT VTI:    0.165 m AI PHT:      480 msec  AORTA Ao Root diam: 3.90 cm Ao Asc diam:  3.70 cm MITRAL VALVE                        TRICUSPID VALVE MV Area (PHT): 4.49 cm             TR Peak grad:   34.0 mmHg MV PHT:        49.01 msec           TR Vmax:        306.00 cm/s MV Decel Time: 169 msec MV E velocity: 60.10 cm/s 103 cm/s  SHUNTS MV A velocity: 52.90 cm/s 70.3 cm/s Systemic VTI:  0.16 m MV  E/A ratio:  1.14       1.5       Systemic Diam: 1.90 cm  Lyman Bishop MD Electronically signed by Lyman Bishop MD Signature Date/Time: 07/17/2019/4:21:31 PM    Final     Medications: Infusions: . sodium chloride 500 mL (07/06/19 1043)  . sodium chloride    . feeding supplement (VITAL 1.5 CAL)      Scheduled Medications: . bisacodyl  10 mg Oral Daily  . carvedilol  3.125 mg Oral BID WC  . Chlorhexidine Gluconate Cloth  6 each Topical Daily  . darbepoetin (ARANESP) injection - NON-DIALYSIS  100 mcg Subcutaneous Q Fri-1800  . diphenhydrAMINE  25 mg Oral Once  . feeding supplement (ENSURE ENLIVE)  237 mL Oral TID BM  . gabapentin  100 mg Oral TID  . heparin  5,000 Units Subcutaneous Q8H  . mouth rinse  15 mL Mouth Rinse BID  . metoCLOPramide (REGLAN) injection  5 mg Intravenous Q6H  . polyethylene glycol  17 g Oral Daily  . sertraline  25 mg Oral BID  . sodium chloride flush  3 mL Intravenous Q12H  . sodium chloride flush  3 mL Intravenous Q12H  . sorbitol  20 mL Oral Once  . torsemide  40 mg Oral Daily    have reviewed scheduled  and prn medications.  Physical Exam: General: NAD, ill appearing Heart:RRR, s1s2 nl Lungs:clear b/l, no crackle Abdomen:soft, Non-tender, non-distended Extremities: Bilateral lower extremity pitting edema+ to dep areas    Joanna Ali 07/19/2019,11:22 AM  LOS: 15 days

## 2019-07-19 NOTE — Procedures (Signed)
Cortrak  Tube Type:  Cortrak - 43 inches Tube Location:  Left nare Initial Placement:  Stomach Secured by: Bridle Technique Used to Measure Tube Placement:  Documented cm marking at nare/ corner of mouth Cortrak Secured At:  65 cm    Cortrak Tube Team Note:  Consult received to place a Cortrak feeding tube.   No x-ray is required. RN may begin using tube.   If the tube becomes dislodged please keep the tube and contact the Cortrak team at [email or web address] ([email or web address]) for replacement.  If after hours and replacement cannot be delayed, place a NG tube and confirm placement with an abdominal x-ray.    Koleen Distance MS, RD, LDN Pager #- [phone] Office#- [phone] After Hours Pager: [phone]

## 2019-07-19 NOTE — Consult Note (Signed)
Medical Consultation   Joanna Ali  WIO:[id number]  DOB: 08/18/1950  DOA: 07/03/2019  PCP: Renaldo Reel, DO   Outpatient Specialists:  Hepatology at [facility]  Requesting physician:  Cleatrice Burke, MD  Reason for consultation: [place]  History of Present Illness: Joanna Ali is an 68 y.o. female with past medical history significant for hypertension, chronic pericardial effusion, moderate aortic valve regurgitation, former smoker, and alcohol abuse.  Initially admitted to the hospital on 12/17 for progressive shortness of breath with anasarca and fatigue.  The symptoms  appear to have started back in September. She had been being followed by cardiology in the outpatient setting.  Previously, worked up by [facility] rheumatology with negative work-up including ESR, lupus anticoagulant, rheumatoid factor, TSH, ANCA, ANA, and light free chain studies.  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  Since that time patient had stopped drinking alcohol as advised.  She had been on diuretics of spironolactone and torosemide without any improvement in symptoms.  Her kidney function appeared to start declining in at the beginning of this month.  CT angiogram of the chest on 12/15 noted moderate pericardial effusion and airspace disease.  Labs also revealed elevated BNP was elevated.   Underwent thoracentesis by PCCM which removed 700 cc of fluid.  Patient had undergone right heart cath 12/17, and underwent pericardial window on 12/23. Attempts were made with IV diuretics, but patient developed worsening kidney function with creatinine up to 5.  Nephrology was formally consulted and started dialysis due to worsening kidney function.  Husband notes patient having poor p.o. intake and therefore core track was placed.  Has a second opinion cajoled for  January 20th.   Review of systems As per HPI otherwise 10 point review of systems negative.    Past Medical History:  Diagnosis Date  . Hypertension   . Leg swelling 03/2019  . Short of breath on exertion 03/2019    Past Surgical History:  Procedure Laterality Date  . APPENDECTOMY    . IR FLUORO GUIDE CV LINE LEFT  07/18/2019  . IR US GUIDE VASC ACCESS LEFT  07/18/2019  . NECK SURGERY    . RIGHT AND LEFT HEART CATH N/A 07/06/2019   Procedure: RIGHT AND LEFT HEART CATH;  Surgeon: Jolaine Artist, MD;  Location: [place] [facility];  Service: Cardiovascular;  Laterality: N/A;  . TEE WITHOUT CARDIOVERSION N/A 03/28/2019   Procedure: TRANSESOPHAGEAL ECHOCARDIOGRAM (TEE);  Surgeon: Geralynn Rile, MD;  Location: [place];  Service: Cardiology;  Laterality: N/A;  . VIDEO ASSISTED THORACOSCOPY Right 07/12/2019   Procedure: VIDEO ASSISTED THORACOSCOPY Pericardial window ;  Surgeon: Lajuana Matte, MD;  Location: [place];  Service: Thoracic;  Laterality: Right;     Allergies:  No Known Allergies   Social History:  reports that she quit smoking about 40 years ago. Her smoking use included cigarettes. [name]. She has never used smokeless tobacco. [name].   Family History: Family History  Problem Relation Age of Onset  . Hypertension Mother      Physical Exam: Vitals:   07/19/19 0311 07/19/19 0349 07/19/19 0716 07/19/19 1155  BP: 129/81  115/74 131/78  Pulse: 92  73 75  Resp:  19  15 20   Temp:  98.4 F (36.9 C) (!) 97.3 F (36.3 C) (!) 97.5 F (36.4 C)  TempSrc:  Oral Axillary Oral  SpO2: 96%  97% 100%  Weight:  51.6 kg    Height:       Constitutional: Frail elderly female who was lethargic and readily falls back to sleep Eyes: PERLA, EOMI, irises appear normal, anicteric sclera,  ENMT: external ears and nose appear normal, core track in place Neck: neck appears normal,  no masses, normal ROM, no thyromegaly, no JVD  CVS: S1-S2 clear, no murmur rubs or gallops, no LE edema, normal pedal pulses  Respiratory:  clear to auscultation bilaterally, no wheezing, rales or rhonchi. Respiratory effort normal. No accessory muscle use.  Abdomen: soft nontender, nondistended, normal bowel sounds, no hepatosplenomegaly, no hernias  Musculoskeletal:  no cyanosis, clubbing or edema noted bilaterally                    Neuro: Cranial nerves II-XII intact, strength, sensation, reflexes Psych: judgement and insight appear normal, stable mood and affect, mental status Skin: no rashes or lesions or ulcers, no induration or nodules    Data reviewed:  I have personally reviewed following labs and imaging studies Labs:  CBC: Recent Labs  Lab 07/12/19 1621 07/13/19 0450 07/14/19 0444  WBC  --  14.9* 11.1*  HGB 10.2* 10.4* 9.8*  HCT 30.0* 33.5* 32.2*  MCV  --  91.3 94.2  PLT  --  263 502    Basic Metabolic Panel: Recent Labs  Lab 07/15/19 0344 07/16/19 0500 07/17/19 0420 07/18/19 0500 07/19/19 0425  NA 133* 128* 127* 124* 132*  K 4.5 4.4 4.4 4.3 3.8  CL 92* 89* 88* 84* 94*  CO2 26 26 23 22 24   GLUCOSE 98 90 89 99 92  BUN 105* 111* 118* 128* 81*  CREATININE 4.15* 4.19* 4.66* 5.10* 4.04*  CALCIUM 8.8* 8.8* 8.7* 8.7* 8.5*  PHOS 8.0* 7.1* 7.4* 7.5* 5.0*   GFR Estimated Creatinine Clearance: 10.6 mL/min (A) (by C-G formula based on SCr of 4.04 mg/dL (H)). Liver Function Tests: Recent Labs  Lab 07/14/19 0444 07/15/19 0344 07/16/19 0500 07/17/19 0420 07/18/19 0500 07/19/19 0425  AST 32  --   --   --   --   --   ALT 11  --   --   --   --   --   ALKPHOS 49  --   --   --   --   --   BILITOT 0.6  --   --   --   --   --   PROT 5.3*  --   --   --   --   --   ALBUMIN 2.8* 2.6* 2.5* 2.5* 2.4* 2.3*   No results for input(s): LIPASE, AMYLASE in the last 168 hours. No results for input(s): AMMONIA in the last 168 hours. Coagulation profile No results for  input(s): INR, PROTIME in the last 168 hours.  Cardiac Enzymes: No results for input(s): CKTOTAL, CKMB, CKMBINDEX, TROPONINI in the last 168 hours. BNP: Invalid input(s): POCBNP CBG: No results for input(s): GLUCAP in the last 168 hours. D-Dimer No results for input(s): DDIMER in the last 72 hours. Hgb A1c No results for input(s): HGBA1C in the last 72 hours. Lipid Profile No results for input(s): CHOL, HDL, LDLCALC, TRIG, CHOLHDL, LDLDIRECT in the last 72 hours. Thyroid function studies No results for input(s): TSH, T4TOTAL, T3FREE, THYROIDAB in the last  72 hours.  Invalid input(s): FREET3 Anemia work up No results for input(s): VITAMINB12, FOLATE, FERRITIN, TIBC, IRON, RETICCTPCT in the last 72 hours. Urinalysis    Component Value Date/Time   COLORURINE YELLOW 07/09/2019 1650   APPEARANCEUR HAZY (A) 07/09/2019 1650   LABSPEC 1.012 07/09/2019 1650   PHURINE 5.0 07/09/2019 1650   GLUCOSEU NEGATIVE 07/09/2019 1650   HGBUR NEGATIVE 07/09/2019 1650   BILIRUBINUR NEGATIVE 07/09/2019 1650   KETONESUR NEGATIVE 07/09/2019 1650   PROTEINUR 30 (A) 07/09/2019 1650   NITRITE NEGATIVE 07/09/2019 1650   LEUKOCYTESUR MODERATE (A) 07/09/2019 1650     Microbiology Recent Results (from the past 240 hour(s))  Surgical pcr screen     Status: None   Collection Time: 07/12/19  3:31 PM   Specimen: Nasal Mucosa; Nasal Swab  Result Value Ref Range Status   MRSA, PCR NEGATIVE NEGATIVE Final   Staphylococcus aureus NEGATIVE NEGATIVE Final    Comment: (NOTE) The Xpert SA Assay (FDA approved for NASAL specimens in patients 62 years of age and older), is one component of a comprehensive surveillance program. It is not intended to diagnose infection nor to guide or monitor treatment. Performed at [facility], [place] [address], [place], [place] [address]   Aerobic/Anaerobic Culture (surgical/deep wound)     Status: None   Collection Time: 07/12/19  5:45 PM   Specimen: PATH Cytology Misc.  fluid; Body Fluid  Result Value Ref Range Status   Specimen Description FLUID PERICARDIAL  Final   Special Requests NONE  Final   Gram Stain NO WBC SEEN NO ORGANISMS SEEN   Final   Culture   Final    No growth aerobically or anaerobically. Performed at [facility], [place] [address], [place], [place] [address]    Report Status 07/17/2019 FINAL  Final  Acid Fast Smear (AFB)     Status: None   Collection Time: 07/12/19  5:45 PM   Specimen: PATH Cytology Misc. fluid; Body Fluid  Result Value Ref Range Status   AFB Specimen Processing Concentration  Final   Acid Fast Smear Negative  Final    Comment: (NOTE) Performed At: [facility] [place], [place] [id number] Rush Farmer MD TD:[id number]    Source (AFB) FLUID  Final    Comment: PERICARDIAL Performed at [facility], [place] [address] [place], [place] [address]        Inpatient Medications:   Scheduled Meds: . bisacodyl  10 mg Oral Daily  . carvedilol  3.125 mg Oral BID WC  . Chlorhexidine Gluconate Cloth  6 each Topical Daily  . darbepoetin (ARANESP) injection - NON-DIALYSIS  100 mcg Subcutaneous Q Fri-1800  . diphenhydrAMINE  25 mg Oral Once  . feeding supplement (ENSURE ENLIVE)  237 mL Oral TID BM  . gabapentin  100 mg Oral TID  . Gerhardt's butt cream   Topical TID  . heparin  5,000 Units Subcutaneous Q8H  . mouth rinse  15 mL Mouth Rinse BID  . metoCLOPramide (REGLAN) injection  5 mg Intravenous Q6H  . polyethylene glycol  17 g Oral Daily  . sertraline  25 mg Oral BID  . sodium chloride flush  3 mL Intravenous Q12H  . sodium chloride flush  3 mL Intravenous Q12H  . torsemide  40 mg Oral Daily   Continuous Infusions: . sodium chloride 500 mL (07/06/19 1043)  . sodium chloride    . feeding supplement (VITAL 1.5 CAL)  Radiological Exams on Admission: DG Chest 2 View  Result Date: 07/19/2019 CLINICAL DATA:  Pleural effusion, pneumothorax, chest tube EXAM: CHEST - 2  VIEW COMPARISON:  07/18/2019 FINDINGS: Left dialysis catheter has been placed with the tip in the right atrium. Right central line and right chest tube remain in place, unchanged. No pneumothorax. Cardiomegaly. Small bilateral pleural effusions. Bilateral lower lobe airspace opacities, unchanged. IMPRESSION: No pneumothorax. Continued bilateral lower lobe airspace opacities concerning for pneumonia. Small effusions. Electronically Signed   By: Rolm Baptise M.D.   On: 07/19/2019 08:34   IR Fluoro Guide CV Line Left  Result Date: 07/18/2019 INDICATION: End-stage renal disease. In need of durable intravenous access for the initiation of dialysis. EXAM: TUNNELED CENTRAL VENOUS HEMODIALYSIS CATHETER PLACEMENT WITH ULTRASOUND AND FLUOROSCOPIC GUIDANCE MEDICATIONS: Ancef 2 gm IV . The antibiotic was given in an appropriate time interval prior to skin puncture. ANESTHESIA/SEDATION: Versed 1 mg IV; Fentanyl 50 mcg IV; Moderate Sedation Time:  15 minutes The patient was continuously monitored during the procedure by the interventional radiology nurse under my direct supervision. FLUOROSCOPY TIME:  30 seconds (2 mGy) COMPLICATIONS: None immediate. PROCEDURE: Informed written consent was obtained from the patient after a discussion of the risks, benefits, and alternatives to treatment. Questions regarding the procedure were encouraged and answered. Given the presence of the existing right jugular approach central venous catheter, the decision was made to place a left internal jugular approach hemodialysis catheter. As such, the left neck and chest were prepped with chlorhexidine in a sterile fashion, and a sterile drape was applied covering the operative field. Maximum barrier sterile technique with sterile gowns and gloves were used for the procedure. A timeout was performed prior to the initiation of the procedure. After creating a small venotomy incision, a micropuncture kit was utilized to access the internal jugular  vein. Real-time ultrasound guidance was utilized for vascular access including the acquisition of a permanent ultrasound image documenting patency of the accessed vessel. The microwire was utilized to measure appropriate catheter length. A stiff Glidewire was advanced to the level of the IVC and the micropuncture sheath was exchanged for a peel-away sheath. A palindrome tunneled hemodialysis catheter measuring 23 cm from tip to cuff was tunneled in a retrograde fashion from the anterior chest wall to the venotomy incision. The catheter was then placed through the peel-away sheath with tips ultimately positioned within the superior aspect of the right atrium. Final catheter positioning was confirmed and documented with a spot radiographic image. The catheter aspirates and flushes normally. The catheter was flushed with appropriate volume heparin dwells. The catheter exit site was secured with a 0-Prolene retention suture. The venotomy incision was closed with an interrupted 4-0 Vicryl, Dermabond and Steri-strips. Dressings were applied. The patient tolerated the procedure well without immediate post procedural complication. IMPRESSION: Successful placement of 23 cm tip to cuff tunneled hemodialysis catheter via the left internal jugular vein with tips terminating within the superior aspect of the right atrium. The catheter is ready for immediate use. Electronically Signed   By: Sandi Mariscal M.D.   On: 07/18/2019 14:15   IR US Guide Vasc Access Left  Result Date: 07/18/2019 INDICATION: End-stage renal disease. In need of durable intravenous access for the initiation of dialysis. EXAM: TUNNELED CENTRAL VENOUS HEMODIALYSIS CATHETER PLACEMENT WITH ULTRASOUND AND FLUOROSCOPIC GUIDANCE MEDICATIONS: Ancef 2 gm IV . The antibiotic was given in an appropriate time interval prior to skin puncture. ANESTHESIA/SEDATION: Versed 1 mg IV; Fentanyl 50 mcg IV; Moderate Sedation  Time:  15 minutes The patient was continuously  monitored during the procedure by the interventional radiology nurse under my direct supervision. FLUOROSCOPY TIME:  30 seconds (2 mGy) COMPLICATIONS: None immediate. PROCEDURE: Informed written consent was obtained from the patient after a discussion of the risks, benefits, and alternatives to treatment. Questions regarding the procedure were encouraged and answered. Given the presence of the existing right jugular approach central venous catheter, the decision was made to place a left internal jugular approach hemodialysis catheter. As such, the left neck and chest were prepped with chlorhexidine in a sterile fashion, and a sterile drape was applied covering the operative field. Maximum barrier sterile technique with sterile gowns and gloves were used for the procedure. A timeout was performed prior to the initiation of the procedure. After creating a small venotomy incision, a micropuncture kit was utilized to access the internal jugular vein. Real-time ultrasound guidance was utilized for vascular access including the acquisition of a permanent ultrasound image documenting patency of the accessed vessel. The microwire was utilized to measure appropriate catheter length. A stiff Glidewire was advanced to the level of the IVC and the micropuncture sheath was exchanged for a peel-away sheath. A palindrome tunneled hemodialysis catheter measuring 23 cm from tip to cuff was tunneled in a retrograde fashion from the anterior chest wall to the venotomy incision. The catheter was then placed through the peel-away sheath with tips ultimately positioned within the superior aspect of the right atrium. Final catheter positioning was confirmed and documented with a spot radiographic image. The catheter aspirates and flushes normally. The catheter was flushed with appropriate volume heparin dwells. The catheter exit site was secured with a 0-Prolene retention suture. The venotomy incision was closed with an interrupted 4-0  Vicryl, Dermabond and Steri-strips. Dressings were applied. The patient tolerated the procedure well without immediate post procedural complication. IMPRESSION: Successful placement of 23 cm tip to cuff tunneled hemodialysis catheter via the left internal jugular vein with tips terminating within the superior aspect of the right atrium. The catheter is ready for immediate use. Electronically Signed   By: Sandi Mariscal M.D.   On: 07/18/2019 14:15   DG CHEST PORT 1 VIEW  Result Date: 07/18/2019 CLINICAL DATA:  Pleural effusion. Additional history provided: Chest tube present, pneumothorax, shortness of breath EXAM: PORTABLE CHEST 1 VIEW COMPARISON:  Chest radiograph 07/16/2019 FINDINGS: Right IJ approach central venous catheter and right basilar chest tube, unchanged. Overlying cardiac monitoring leads. Unchanged cardiomegaly. A previously demonstrated tiny right apical pneumothorax is not well appreciated on today's study. Unchanged hazy opacity at the right lung base consistent with small effusion with atelectasis and/or pneumonia. Persistent small to moderate left pleural effusion with left basilar atelectasis. IMPRESSION: Support apparatus unchanged. A previously demonstrated tiny right apical pneumothorax is not well appreciated on today's study. Unchanged small right effusion with right basilar atelectasis and/or pneumonia. Persistent small to moderate left pleural effusion with left basilar atelectasis. Electronically Signed   By: Kellie Simmering DO   On: 07/18/2019 08:09   ECHOCARDIOGRAM LIMITED  Result Date: 07/17/2019   ECHOCARDIOGRAM REPORT   Patient Name:   EMY ANGEVINE Date of Exam: 07/17/2019 Medical Rec #:  [id number]    Height:       66.0 in Accession #:    [id number]   Weight:       124.3 lb Date of Birth:  1950/11/21    BSA:          1.63 m Patient Age:  68 years     BP:           125/78 mmHg Patient Gender: F            HR:           71 bpm. Exam Location:  Inpatient Procedure: Limited Echo,  Cardiac Doppler and Limited Color Doppler Indications:     I31.3 Pericardial effusion  History:         Patient has prior history of Echocardiogram examinations, most                  recent 07/09/2019. Signs/Symptoms:Dyspnea; Risk                  Factors:Hypertension.  Sonographer:     Tiffany Dance Referring Phys:  [id number] [place] Diagnosing Phys: Lyman Bishop MD IMPRESSIONS  1. Left ventricular ejection fraction, by visual estimation, is 50 to 55%. The left ventricle has low normal function. There is mildly increased left ventricular hypertrophy.  2. Moderate hypokinesis of the left ventricular, entire septal wall.  3. The left ventricle demonstrates regional wall motion abnormalities.  4. Global right ventricle has normal systolic function.The right ventricular size is normal. No increase in right ventricular wall thickness.  5. Left atrial size was normal.  6. Right atrial size was mildly dilated.  7. Moderate pleural effusion in the left lateral region.  8. The pericardial effusion is circumferential.  9. Trivial pericardial effusion is present. 10. The mitral valve is abnormal. Mild mitral valve regurgitation. 11. The tricuspid valve is grossly normal. Tricuspid valve regurgitation moderate. 12. Aortic valve regurgitation is moderate. 13. The aortic valve is tricuspid. Aortic valve regurgitation is moderate. Mild aortic valve sclerosis without stenosis. 14. The pulmonic valve was grossly normal. Pulmonic valve regurgitation is not visualized. 15. Moderately elevated pulmonary artery systolic pressure. 16. The inferior vena cava is dilated in size with >50% respiratory variability, suggesting right atrial pressure of 8 mmHg. FINDINGS  Left Ventricle: Left ventricular ejection fraction, by visual estimation, is 50 to 55%. The left ventricle has low normal function. Moderate hypokinesis of the left ventricular, entire septal wall. The left ventricle demonstrates regional wall motion abnormalities.  There is mildly increased left ventricular hypertrophy. Right Ventricle: The right ventricular size is normal. No increase in right ventricular wall thickness. Global RV systolic function is has normal systolic function. The tricuspid regurgitant velocity is 2.91 m/s, and with an assumed right atrial pressure  of 8 mmHg, the estimated right ventricular systolic pressure is moderately elevated at 42.0 mmHg. Left Atrium: Left atrial size was normal in size. Right Atrium: Right atrial size was mildly dilated Pericardium: Trivial pericardial effusion is present. The pericardial effusion is circumferential. There is a moderate pleural effusion in the left lateral region. Mitral Valve: The mitral valve is abnormal. There is mild thickening of the mitral valve leaflet(s). Mild mitral valve regurgitation. Tricuspid Valve: The tricuspid valve is grossly normal. Tricuspid valve regurgitation moderate. Aortic Valve: The aortic valve is tricuspid. Aortic valve regurgitation is moderate. Aortic regurgitation PHT measures 480 msec. Mild aortic valve sclerosis is present, with no evidence of aortic valve stenosis. Pulmonic Valve: The pulmonic valve was grossly normal. Pulmonic valve regurgitation is not visualized. Pulmonic regurgitation is not visualized. Aorta: The aortic root and ascending aorta are structurally normal, with no evidence of dilitation. Venous: The inferior vena cava is dilated in size with greater than 50% respiratory variability, suggesting right atrial pressure of 8 mmHg. IAS/Shunts: No atrial  level shunt detected by color flow Doppler.  LEFT VENTRICLE PLAX 2D LVIDd:         4.70 cm LVIDs:         3.20 cm LV PW:         1.20 cm LV IVS:        0.80 cm LVOT diam:     1.90 cm LV SV:         61 ml LV SV Index:   37.99 LVOT Area:     2.84 cm  RIGHT VENTRICLE          IVC RV Basal diam:  3.30 cm  IVC diam: 2.10 cm RV Mid diam:    1.50 cm LEFT ATRIUM             Index       RIGHT ATRIUM           Index LA diam:         4.10 cm 2.51 cm/m  RA Area:     21.70 cm LA Vol (A2C):   71.5 ml 43.76 ml/m RA Volume:   59.90 ml  36.66 ml/m LA Vol (A4C):   47.6 ml 29.13 ml/m LA Biplane Vol: 62.6 ml 38.31 ml/m  AORTIC VALVE LVOT Vmax:   87.90 cm/s LVOT Vmean:  58.000 cm/s LVOT VTI:    0.165 m AI PHT:      480 msec  AORTA Ao Root diam: 3.90 cm Ao Asc diam:  3.70 cm MITRAL VALVE                        TRICUSPID VALVE MV Area (PHT): 4.49 cm             TR Peak grad:   34.0 mmHg MV PHT:        49.01 msec           TR Vmax:        306.00 cm/s MV Decel Time: 169 msec MV E velocity: 60.10 cm/s 103 cm/s  SHUNTS MV A velocity: 52.90 cm/s 70.3 cm/s Systemic VTI:  0.16 m MV E/A ratio:  1.14       1.5       Systemic Diam: 1.90 cm  Lyman Bishop MD Electronically signed by Lyman Bishop MD Signature Date/Time: 07/17/2019/4:21:31 PM    Final     Impression/Recommendations Anasarca: From review of records it appears patient symptoms are thought likely related to her history of alcohol use and hepatic steatosis after formally being evaluated by hepatology at [facility].  It appears she has had at least 2 separate work-ups evaluating for possible inflammatory causes of symptoms including [facility] rheumatology who I discussed previous work-up with over the phone, and by hematology at [facility] without any clear cause to suggest this.  Patient also has upcoming follow-up appointment with another otologist for a second opinion on January 20.  After dialysis patient now without any significant lower extremity swelling or abdominal distention.  Patient's poor overall nutrition status likely a compounding factor in addition to her history of liver disease.  No additional work-up to attribute for her symptoms currently at this time.  Pericardial effusion: Patient status post pericardial window on 12/23.   -Managed by CT surgery  Acute renal failure: Patient currently on dialysis with creatinine showing some improvement today. -Per nephrology    Thank you  for this consultation.  [facility] hospitalist team will follow the patient with you.   Time  Spent: 35 minutes Norval Morton M.D. [facility] 07/19/2019, 1:53 PM

## 2019-07-20 ENCOUNTER — Inpatient Hospital Stay (HOSPITAL_COMMUNITY): Payer: Medicare Other

## 2019-07-20 LAB — CBC
HCT: 30.7 % — ABNORMAL LOW (ref 36.0–46.0)
Hemoglobin: 9.5 g/dL — ABNORMAL LOW (ref 12.0–15.0)
MCH: 29 pg (ref 26.0–34.0)
MCHC: 30.9 g/dL (ref 30.0–36.0)
MCV: 93.6 fL (ref 80.0–100.0)
Platelets: 263 10*3/uL (ref 150–400)
RBC: 3.28 MIL/uL — ABNORMAL LOW (ref 3.87–5.11)
RDW: 19 % — ABNORMAL HIGH (ref 11.5–15.5)
WBC: 9.5 10*3/uL (ref 4.0–10.5)
nRBC: 0 % (ref 0.0–0.2)

## 2019-07-20 LAB — RENAL FUNCTION PANEL
Albumin: 2.2 g/dL — ABNORMAL LOW (ref 3.5–5.0)
Anion gap: 14 (ref 5–15)
BUN: 86 mg/dL — ABNORMAL HIGH (ref 8–23)
CO2: 25 mmol/L (ref 22–32)
Calcium: 8.5 mg/dL — ABNORMAL LOW (ref 8.9–10.3)
Chloride: 90 mmol/L — ABNORMAL LOW (ref 98–111)
Creatinine, Ser: 4.55 mg/dL — ABNORMAL HIGH (ref 0.44–1.00)
GFR calc Af Amer: 11 mL/min — ABNORMAL LOW (ref >60)
GFR calc non Af Amer: 9 mL/min — ABNORMAL LOW (ref >60)
Glucose, Bld: 139 mg/dL — ABNORMAL HIGH (ref 70–99)
Phosphorus: 5.5 mg/dL — ABNORMAL HIGH (ref 2.5–4.6)
Potassium: 4 mmol/L (ref 3.5–5.1)
Sodium: 129 mmol/L — ABNORMAL LOW (ref 135–145)

## 2019-07-20 LAB — HEPATITIS B E ANTIBODY: Hep B E Ab: NEGATIVE

## 2019-07-20 MED ORDER — SODIUM CHLORIDE 0.9 % IV SOLN: 100.0000 mL | INTRAVENOUS | Status: DC | PRN

## 2019-07-20 MED ORDER — HEPARIN SODIUM (PORCINE) 1000 UNIT/ML DIALYSIS
1000.0000 [IU] | INTRAMUSCULAR | Status: DC | PRN
  Administered 2019-08-09 – 2019-08-15 (×2): 3800 [IU] via INTRAVENOUS_CENTRAL
  Filled 2019-07-20 (×14): qty 1

## 2019-07-20 MED ORDER — BISACODYL 10 MG RE SUPP: 10.0000 mg | Freq: Every day | RECTAL | Status: DC | PRN

## 2019-07-20 MED ORDER — ALTEPLASE 2 MG IJ SOLR: 2.0000 mg | Freq: Once | INTRAMUSCULAR | Status: DC | PRN

## 2019-07-20 MED ORDER — CHLORHEXIDINE GLUCONATE CLOTH 2 % EX PADS
6.0000 | MEDICATED_PAD | Freq: Every day | CUTANEOUS | Status: DC
  Administered 2019-07-20 – 2019-08-03 (×15): 6 via TOPICAL

## 2019-07-20 MED ORDER — LIDOCAINE-PRILOCAINE 2.5-2.5 % EX CREA
1.0000 "application " | TOPICAL_CREAM | CUTANEOUS | Status: DC | PRN
  Filled 2019-07-20: qty 5

## 2019-07-20 MED ORDER — PENTAFLUOROPROP-TETRAFLUOROETH EX AERO
1.0000 "application " | INHALATION_SPRAY | CUTANEOUS | Status: DC | PRN
  Filled 2019-07-20: qty 116

## 2019-07-20 MED ORDER — LIDOCAINE HCL (PF) 1 % IJ SOLN: 5.0000 mL | INTRAMUSCULAR | Status: DC | PRN

## 2019-07-20 NOTE — Progress Notes (Signed)
[facility] Progress Note    Assessment/ Plan:   Pt is a 68 y.o. yo female  with history of HTN, former smoker, hepatic steatosis with low albumin, chronic pericardial effusion, moderate AR admitted on 12/17 for progressive dyspnea, anasarca and fatigue, seen as a consultationfor AKIand fluid overload.  #Acute kidney injury,nonoliguric (crt normal in Sept, was 1.43 on 07/03/19) : Likely hemodynamically mediated concomitant with use of IV contrast- U/A not remarkable.  Autoimmune work-up unremarkable in the past. No improvement despite Foley catheter.  Started HD 07/18/19 with IR placed [facility] for progressive uremia.  Not a lot of UOP yesterday, BUN/ Cr still up, will do HD again today 12/31.  #Hyponatremia, hypervolemic: improved with HD, CTM  #Anasarca presumably due to liver disease which was diagnosed at [facility]. She has a history of liver steatosis.No evidence of GN/nephrotic syndrome. 3rd spacing also. Remains on diuretics for now (torsemide)   #Moderate to large pericardial effusion:  s/p pericardial window on 12/23- TCTS following, stable  #Hypertension: Blood pressure acceptable. On low dose Coreg. . . #Anemia: s/p transfusion on 12/22.  gave feraheme and one dose of ESA on 12/23- stable  # Constipation: will order mineral oil enema and miralax--> no results, will order suppository as well. Avoid FLEETs and MOM products   Subjective:    Sleepy, reports constipation and tenesmus feeling.     Objective:   BP 128/87 (BP Location: Right Arm)   Pulse 78   Temp 97.8 F (36.6 C) (Oral)   Resp 20   Ht 5' 6"  (1.676 m)   Wt 52.6 kg   SpO2 97%   BMI 18.72 kg/m   Intake/Output Summary (Last 24 hours) at 07/20/2019 5701 Last data filed at 07/20/2019 0530 Gross per 24 hour  Intake 826.83 ml  Output 500 ml  Net 326.83 ml   Weight change: -1.4 kg  Physical Exam: Gen: cachectic, fatigued, intermittently drifting off to sleep CVS: RRR, well-healing  scars Resp: normal WOB Abd: thin, mildly painful to palpation Ext: 1+ anasarca ACCESS: [facility]   Imaging: DG Chest 2 View  Result Date: 07/19/2019 CLINICAL DATA:  Pleural effusion, pneumothorax, chest tube EXAM: CHEST - 2 VIEW COMPARISON:  07/18/2019 FINDINGS: Left dialysis catheter has been placed with the tip in the right atrium. Right central line and right chest tube remain in place, unchanged. No pneumothorax. Cardiomegaly. Small bilateral pleural effusions. Bilateral lower lobe airspace opacities, unchanged. IMPRESSION: No pneumothorax. Continued bilateral lower lobe airspace opacities concerning for pneumonia. Small effusions. Electronically Signed   By: Rolm Baptise M.D.   On: 07/19/2019 08:34   IR Fluoro Guide CV Line Left  Result Date: 07/18/2019 INDICATION: End-stage renal disease. In need of durable intravenous access for the initiation of dialysis. EXAM: TUNNELED CENTRAL VENOUS HEMODIALYSIS CATHETER PLACEMENT WITH ULTRASOUND AND FLUOROSCOPIC GUIDANCE MEDICATIONS: Ancef 2 gm IV . The antibiotic was given in an appropriate time interval prior to skin puncture. ANESTHESIA/SEDATION: Versed 1 mg IV; Fentanyl 50 mcg IV; Moderate Sedation Time:  15 minutes The patient was continuously monitored during the procedure by the interventional radiology nurse under my direct supervision. FLUOROSCOPY TIME:  30 seconds (2 mGy) COMPLICATIONS: None immediate. PROCEDURE: Informed written consent was obtained from the patient after a discussion of the risks, benefits, and alternatives to treatment. Questions regarding the procedure were encouraged and answered. Given the presence of the existing right jugular approach central venous catheter, the decision was made to place a left internal jugular approach hemodialysis catheter. As such,  the left neck and chest were prepped with chlorhexidine in a sterile fashion, and a sterile drape was applied covering the operative field. Maximum barrier sterile technique  with sterile gowns and gloves were used for the procedure. A timeout was performed prior to the initiation of the procedure. After creating a small venotomy incision, a micropuncture kit was utilized to access the internal jugular vein. Real-time ultrasound guidance was utilized for vascular access including the acquisition of a permanent ultrasound image documenting patency of the accessed vessel. The microwire was utilized to measure appropriate catheter length. A stiff Glidewire was advanced to the level of the IVC and the micropuncture sheath was exchanged for a peel-away sheath. A palindrome tunneled hemodialysis catheter measuring 23 cm from tip to cuff was tunneled in a retrograde fashion from the anterior chest wall to the venotomy incision. The catheter was then placed through the peel-away sheath with tips ultimately positioned within the superior aspect of the right atrium. Final catheter positioning was confirmed and documented with a spot radiographic image. The catheter aspirates and flushes normally. The catheter was flushed with appropriate volume heparin dwells. The catheter exit site was secured with a 0-Prolene retention suture. The venotomy incision was closed with an interrupted 4-0 Vicryl, Dermabond and Steri-strips. Dressings were applied. The patient tolerated the procedure well without immediate post procedural complication. IMPRESSION: Successful placement of 23 cm tip to cuff tunneled hemodialysis catheter via the left internal jugular vein with tips terminating within the superior aspect of the right atrium. The catheter is ready for immediate use. Electronically Signed   By: Sandi Mariscal M.D.   On: 07/18/2019 14:15   IR US Guide Vasc Access Left  Result Date: 07/18/2019 INDICATION: End-stage renal disease. In need of durable intravenous access for the initiation of dialysis. EXAM: TUNNELED CENTRAL VENOUS HEMODIALYSIS CATHETER PLACEMENT WITH ULTRASOUND AND FLUOROSCOPIC GUIDANCE  MEDICATIONS: Ancef 2 gm IV . The antibiotic was given in an appropriate time interval prior to skin puncture. ANESTHESIA/SEDATION: Versed 1 mg IV; Fentanyl 50 mcg IV; Moderate Sedation Time:  15 minutes The patient was continuously monitored during the procedure by the interventional radiology nurse under my direct supervision. FLUOROSCOPY TIME:  30 seconds (2 mGy) COMPLICATIONS: None immediate. PROCEDURE: Informed written consent was obtained from the patient after a discussion of the risks, benefits, and alternatives to treatment. Questions regarding the procedure were encouraged and answered. Given the presence of the existing right jugular approach central venous catheter, the decision was made to place a left internal jugular approach hemodialysis catheter. As such, the left neck and chest were prepped with chlorhexidine in a sterile fashion, and a sterile drape was applied covering the operative field. Maximum barrier sterile technique with sterile gowns and gloves were used for the procedure. A timeout was performed prior to the initiation of the procedure. After creating a small venotomy incision, a micropuncture kit was utilized to access the internal jugular vein. Real-time ultrasound guidance was utilized for vascular access including the acquisition of a permanent ultrasound image documenting patency of the accessed vessel. The microwire was utilized to measure appropriate catheter length. A stiff Glidewire was advanced to the level of the IVC and the micropuncture sheath was exchanged for a peel-away sheath. A palindrome tunneled hemodialysis catheter measuring 23 cm from tip to cuff was tunneled in a retrograde fashion from the anterior chest wall to the venotomy incision. The catheter was then placed through the peel-away sheath with tips ultimately positioned within the superior aspect of the  right atrium. Final catheter positioning was confirmed and documented with a spot radiographic image. The  catheter aspirates and flushes normally. The catheter was flushed with appropriate volume heparin dwells. The catheter exit site was secured with a 0-Prolene retention suture. The venotomy incision was closed with an interrupted 4-0 Vicryl, Dermabond and Steri-strips. Dressings were applied. The patient tolerated the procedure well without immediate post procedural complication. IMPRESSION: Successful placement of 23 cm tip to cuff tunneled hemodialysis catheter via the left internal jugular vein with tips terminating within the superior aspect of the right atrium. The catheter is ready for immediate use. Electronically Signed   By: Sandi Mariscal M.D.   On: 07/18/2019 14:15    Labs: BMET Recent Labs  Lab 07/14/19 0551 07/15/19 0344 07/16/19 0500 07/17/19 0420 07/18/19 0500 07/19/19 0425 07/20/19 0515  NA 133* 133* 128* 127* 124* 132* 129*  K 4.8 4.5 4.4 4.4 4.3 3.8 4.0  CL 95* 92* 89* 88* 84* 94* 90*  CO2 18* 26 26 23 22 24 25   GLUCOSE 97 98 90 89 99 92 139*  BUN 100* 105* 111* 118* 128* 81* 86*  CREATININE 3.87* 4.15* 4.19* 4.66* 5.10* 4.04* 4.55*  CALCIUM 9.0 8.8* 8.8* 8.7* 8.7* 8.5* 8.5*  PHOS 9.1* 8.0* 7.1* 7.4* 7.5* 5.0* 5.5*   CBC Recent Labs  Lab 07/14/19 0444 07/20/19 0515  WBC 11.1* 9.5  HGB 9.8* 9.5*  HCT 32.2* 30.7*  MCV 94.2 93.6  PLT 245 263    Medications:    . bisacodyl  10 mg Oral Daily  . carvedilol  3.125 mg Oral BID WC  . Chlorhexidine Gluconate Cloth  6 each Topical Daily  . Chlorhexidine Gluconate Cloth  6 each Topical Q0600  . collagenase   Topical BID  . darbepoetin (ARANESP) injection - NON-DIALYSIS  100 mcg Subcutaneous Q Fri-1800  . diphenhydrAMINE  25 mg Oral Once  . feeding supplement (ENSURE ENLIVE)  237 mL Oral TID BM  . gabapentin  100 mg Oral TID  . Gerhardt's butt cream   Topical TID  . heparin  5,000 Units Subcutaneous Q8H  . mouth rinse  15 mL Mouth Rinse BID  . polyethylene glycol  17 g Oral Daily  . sertraline  25 mg Oral BID  .  sodium chloride flush  3 mL Intravenous Q12H  . sodium chloride flush  3 mL Intravenous Q12H  . torsemide  40 mg Oral Daily      [facility] pgr [phone] 07/20/2019, 8:19 AM

## 2019-07-20 NOTE — Progress Notes (Signed)
Progress Note  [name] Date of Encounter: 07/20/2019  Primary Cardiologist: Evalina Field, MD   Subjective   68 year old female who was admitted with anasarca.  Echocardiogram has revealed mild LV dysfunction with an ejection fraction of 45% to 50%.  She has moderate to severe aortic insufficiency, moderate to severe tricuspid regurgitation, mild mitral regurgitation.  Heart catheterization on December 17 reveals moderate pulmonary hypertension with a PA pressure of 48/17 with a mean of 28.  Cardiac output was 5.0. She had a moderate sized pericardial effusion but there was no evidence of pericardial tamponade or constrictive physiology. She had a pericardial window on December 23 and appears to be doing well.  She is started dialysis.  Creatinine is up slightly today.  Her main complaint is severe belly pain due to constipation.  Edema is generally better.   Inpatient Medications    Scheduled Meds: . bisacodyl  10 mg Oral Daily  . carvedilol  3.125 mg Oral BID WC  . Chlorhexidine Gluconate Cloth  6 each Topical Daily  . Chlorhexidine Gluconate Cloth  6 each Topical Q0600  . collagenase   Topical BID  . darbepoetin (ARANESP) injection - NON-DIALYSIS  100 mcg Subcutaneous Q Fri-1800  . diphenhydrAMINE  25 mg Oral Once  . feeding supplement (ENSURE ENLIVE)  237 mL Oral TID BM  . gabapentin  100 mg Oral TID  . Gerhardt's butt cream   Topical TID  . heparin  5,000 Units Subcutaneous Q8H  . mouth rinse  15 mL Mouth Rinse BID  . polyethylene glycol  17 g Oral Daily  . sertraline  25 mg Oral BID  . sodium chloride flush  3 mL Intravenous Q12H  . sodium chloride flush  3 mL Intravenous Q12H  . torsemide  40 mg Oral Daily   Continuous Infusions: . sodium chloride 500 mL (07/06/19 1043)  . sodium chloride    . sodium chloride    . sodium chloride    . feeding supplement (VITAL 1.5 CAL) 50 mL/hr at 07/20/19 0619   PRN Meds: sodium chloride, sodium  chloride, sodium chloride, sodium chloride, acetaminophen, alteplase, bisacodyl, camphor-menthol, heparin, hydrOXYzine, lidocaine (PF), lidocaine-prilocaine, morphine injection, ondansetron (ZOFRAN) IV, oxyCODONE, pentafluoroprop-tetrafluoroeth, polyvinyl alcohol, senna-docusate, sodium chloride flush, traMADol, witch hazel-glycerin   Vital Signs    Vitals:   07/19/19 2323 07/20/19 0341 07/20/19 0727 07/20/19 0729  BP: 123/78 117/77  128/87  Pulse: 78 78  78  Resp: _0 Temp: 98.1 F (36.7 C) 97.6 F (36.4 C) 97.8 F (36.6 C) 97.8 F (36.6 C)  TempSrc: Oral Oral Oral Oral  SpO2: 97% 97%  97%  Weight:  52.6 kg    Height:        Intake/Output Summary (Last 24 hours) at 07/20/2019 0834 Last data filed at 07/20/2019 0530 Gross per 24 hour  Intake 826.83 ml  Output 500 ml  Net 326.83 ml   Last 3 Weights 07/20/2019 07/19/2019 07/18/2019  Weight (lbs) 115 lb 15.4 oz 113 lb 12.1 oz 119 lb 0.8 oz  Weight (kg) 52.6 kg 51.6 kg 54 kg      Telemetry    NSR  - Personally Reviewed  ECG      - Personally Reviewed  Physical Exam   Physical Exam: Blood pressure 128/87, pulse 78, temperature 97.8 F (36.6 C), temperature source Oral, resp. rate 20, height _1  (1.676 m), weight 52.6 kg, SpO2 97 %.  GEN:   Chronically ill-appearing female,  she is in moderate distress because of her abdominal pain. HEENT: Normal NECK: No JVD; No carotid bruits LYMPHATICS: No lymphadenopathy CARDIAC: Regular rate.  Soft systolic murmur. RESPIRATORY:  Clear to auscultation without rales, wheezing or rhonchi  ABDOMEN: Abdomen is full.  Mildly tender. MUSCULOSKELETAL: 1+ edema involving her lower abdomen and thighs.  Her calves are largely free of edema. SKIN: Warm and dry NEUROLOGIC:  Alert and oriented x 3   Labs    High Sensitivity Troponin:   Recent Labs  Lab 07/03/19 1739 07/03/19 1930  TROPONINIHS 149* 163*      Chemistry Recent Labs  Lab 07/14/19 0444 07/18/19 0500  07/19/19 0425 07/20/19 0515  NA 133* 124* 132* 129*  K 4.4 4.3 3.8 4.0  CL 91* 84* 94* 90*  CO2 _0 GLUCOSE 103* 99 92 139*  BUN 96* 128* 81* 86*  CREATININE 3.98* 5.10* 4.04* 4.55*  CALCIUM 9.1 8.7* 8.5* 8.5*  PROT 5.3*  --   --   --   ALBUMIN 2.8* 2.4* 2.3* 2.2*  AST 32  --   --   --   ALT 11  --   --   --   ALKPHOS 49  --   --   --   BILITOT 0.6  --   --   --   GFRNONAA 11* 8* 11* 9*  GFRAA 12* 9* 12* 11*  ANIONGAP 17* 18* 14 14     Hematology Recent Labs  Lab 07/14/19 0444 07/20/19 0515  WBC 11.1* 9.5  RBC 3.42* 3.28*  HGB 9.8* 9.5*  HCT 32.2* 30.7*  MCV 94.2 93.6  MCH 28.7 29.0  MCHC 30.4 30.9  RDW 18.9* 19.0*  PLT 245 263    BNPNo results for input(s): BNP, PROBNP in the last 168 hours.   DDimer No results for input(s): DDIMER in the last 168 hours.   Radiology    DG Chest 2 View  Result Date: 07/19/2019 CLINICAL DATA:  Pleural effusion, pneumothorax, chest tube EXAM: CHEST - 2 VIEW COMPARISON:  07/18/2019 FINDINGS: Left dialysis catheter has been placed with the tip in the right atrium. Right central line and right chest tube remain in place, unchanged. No pneumothorax. Cardiomegaly. Small bilateral pleural effusions. Bilateral lower lobe airspace opacities, unchanged. IMPRESSION: No pneumothorax. Continued bilateral lower lobe airspace opacities concerning for pneumonia. Small effusions. Electronically Signed   By: Rolm Baptise M.D.   On: 07/19/2019 08:34   IR Fluoro Guide CV Line Left  Result Date: 07/18/2019 INDICATION: End-stage renal disease. In need of durable intravenous access for the initiation of dialysis. EXAM: TUNNELED CENTRAL VENOUS HEMODIALYSIS CATHETER PLACEMENT WITH ULTRASOUND AND FLUOROSCOPIC GUIDANCE MEDICATIONS: Ancef 2 gm IV . The antibiotic was given in an appropriate time interval prior to skin puncture. ANESTHESIA/SEDATION: Versed 1 mg IV; Fentanyl 50 mcg IV; Moderate Sedation Time:  15 minutes The patient was continuously  monitored during the procedure by the interventional radiology nurse under my direct supervision. FLUOROSCOPY TIME:  30 seconds (2 mGy) COMPLICATIONS: None immediate. PROCEDURE: Informed written consent was obtained from the patient after a discussion of the risks, benefits, and alternatives to treatment. Questions regarding the procedure were encouraged and answered. Given the presence of the existing right jugular approach central venous catheter, the decision was made to place a left internal jugular approach hemodialysis catheter. As such, the left neck and chest were prepped with chlorhexidine in a sterile fashion, and a sterile drape was applied covering the operative field. Maximum  barrier sterile technique with sterile gowns and gloves were used for the procedure. A timeout was performed prior to the initiation of the procedure. After creating a small venotomy incision, a micropuncture kit was utilized to access the internal jugular vein. Real-time ultrasound guidance was utilized for vascular access including the acquisition of a permanent ultrasound image documenting patency of the accessed vessel. The microwire was utilized to measure appropriate catheter length. A stiff Glidewire was advanced to the level of the IVC and the micropuncture sheath was exchanged for a peel-away sheath. A palindrome tunneled hemodialysis catheter measuring 23 cm from tip to cuff was tunneled in a retrograde fashion from the anterior chest wall to the venotomy incision. The catheter was then placed through the peel-away sheath with tips ultimately positioned within the superior aspect of the right atrium. Final catheter positioning was confirmed and documented with a spot radiographic image. The catheter aspirates and flushes normally. The catheter was flushed with appropriate volume heparin dwells. The catheter exit site was secured with a 0-Prolene retention suture. The venotomy incision was closed with an interrupted 4-0  Vicryl, Dermabond and Steri-strips. Dressings were applied. The patient tolerated the procedure well without immediate post procedural complication. IMPRESSION: Successful placement of 23 cm tip to cuff tunneled hemodialysis catheter via the left internal jugular vein with tips terminating within the superior aspect of the right atrium. The catheter is ready for immediate use. Electronically Signed   By: Sandi Mariscal M.D.   On: 07/18/2019 14:15   IR US Guide Vasc Access Left  Result Date: 07/18/2019 INDICATION: End-stage renal disease. In need of durable intravenous access for the initiation of dialysis. EXAM: TUNNELED CENTRAL VENOUS HEMODIALYSIS CATHETER PLACEMENT WITH ULTRASOUND AND FLUOROSCOPIC GUIDANCE MEDICATIONS: Ancef 2 gm IV . The antibiotic was given in an appropriate time interval prior to skin puncture. ANESTHESIA/SEDATION: Versed 1 mg IV; Fentanyl 50 mcg IV; Moderate Sedation Time:  15 minutes The patient was continuously monitored during the procedure by the interventional radiology nurse under my direct supervision. FLUOROSCOPY TIME:  30 seconds (2 mGy) COMPLICATIONS: None immediate. PROCEDURE: Informed written consent was obtained from the patient after a discussion of the risks, benefits, and alternatives to treatment. Questions regarding the procedure were encouraged and answered. Given the presence of the existing right jugular approach central venous catheter, the decision was made to place a left internal jugular approach hemodialysis catheter. As such, the left neck and chest were prepped with chlorhexidine in a sterile fashion, and a sterile drape was applied covering the operative field. Maximum barrier sterile technique with sterile gowns and gloves were used for the procedure. A timeout was performed prior to the initiation of the procedure. After creating a small venotomy incision, a micropuncture kit was utilized to access the internal jugular vein. Real-time ultrasound guidance was  utilized for vascular access including the acquisition of a permanent ultrasound image documenting patency of the accessed vessel. The microwire was utilized to measure appropriate catheter length. A stiff Glidewire was advanced to the level of the IVC and the micropuncture sheath was exchanged for a peel-away sheath. A palindrome tunneled hemodialysis catheter measuring 23 cm from tip to cuff was tunneled in a retrograde fashion from the anterior chest wall to the venotomy incision. The catheter was then placed through the peel-away sheath with tips ultimately positioned within the superior aspect of the right atrium. Final catheter positioning was confirmed and documented with a spot radiographic image. The catheter aspirates and flushes normally. The catheter was flushed  with appropriate volume heparin dwells. The catheter exit site was secured with a 0-Prolene retention suture. The venotomy incision was closed with an interrupted 4-0 Vicryl, Dermabond and Steri-strips. Dressings were applied. The patient tolerated the procedure well without immediate post procedural complication. IMPRESSION: Successful placement of 23 cm tip to cuff tunneled hemodialysis catheter via the left internal jugular vein with tips terminating within the superior aspect of the right atrium. The catheter is ready for immediate use. Electronically Signed   By: Sandi Mariscal M.D.   On: 07/18/2019 14:15    Cardiac Studies     Patient Profile     68 y.o. female with lower body anasarca presumably due to liver disease.   Assessment & Plan    1.  Anasarca: Gradual improvement with dialysis.  2.  Pericardial effusion:   Further plans per T CTS.  3.  Moderate aortic insufficiency: Stable 4.  Acute renal failure: She started dialysis yesterday.  Creatinine is up slightly.  Further management per nephrology.  For questions or updates, please contact Jack Please consult [email or web address] for contact info under         Signed, Mertie Moores, MD  07/20/2019, 8:34 AM

## 2019-07-20 NOTE — Progress Notes (Addendum)
      [address]       [place] [address]             [phone]       8 Days Post-Op Procedure(s) (LRB): VIDEO ASSISTED THORACOSCOPY Pericardial window  (Right)  Subjective: Patient denies nausea this am. Her primary complaint is no bowel movement, despite laxatives, in 2 days.   Objective: Vital signs in last 24 hours: Temp:  [97.3 F (36.3 C)-98.1 F (36.7 C)] 97.6 F (36.4 C) (12/31 0341) Pulse Rate:  [73-78] 78 (12/31 0341) Cardiac Rhythm: Normal sinus rhythm (12/31 0341) Resp:  [15-26] 20 (12/31 0341) BP: (115-131)/(74-98) 117/77 (12/31 0341) SpO2:  [97 %-100 %] 97 % (12/31 0341) Weight:  [52.6 kg] 52.6 kg (12/31 0341)      Intake/Output from previous day: 12/30 0701 - 12/31 0700 In: 826.8 [P.O.:390; I.V.:50; NG/GT:386.8] Out: 500 [Urine:500]   Physical Exam:  Cardiovascular: RRR Pulmonary: Mostly clear Abdomen: Soft, non tender, bowel sounds present. Extremities: Mild bilateral lower extremity edema. Wounds: Clean and dry.  No erythema or signs of infection.   Lab Results: CBC: Recent Labs    07/20/19 0515  WBC 9.5  HGB 9.5*  HCT 30.7*  PLT 263   BMET:  Recent Labs    07/19/19 0425 07/20/19 0515  NA 132* 129*  K 3.8 4.0  CL 94* 90*  CO2 24 25  GLUCOSE 92 139*  BUN 81* 86*  CREATININE 4.04* 4.55*  CALCIUM 8.5* 8.5*    PT/INR: No results for input(s): LABPROT, INR in the last 72 hours. ABG:  INR: Will add last result for INR, ABG once components are confirmed Will add last 4 CBG results once components are confirmed  Assessment/Plan:  1. CV - SR. Moderate AI on echocardiogram. 2.  Pulmonary - On room air this am.  Final cytology results:Reactive mesothelial cells present 3. Anasarca-presumably due to liver disease which was diagnosed at [facility]. She has a history of liver steatosis. 4. Urinary retention-foley re inserted 5. AKI-S/p tunneled HD catheter by IR 12/29. Had HD yesterday. Creatinine this am 4.55.  Nephrology following 6. Anemia-Last H and H 9.5 and 30.7 7. Hypervolemic hyponatremia-sodium this am 129 8. GI-Has Cortrak and receiving tube feedings this am. Pre albumin 11.3. Constipation management per primary  Donielle M ZimmermanPA-C 07/20/2019,7:14 AM [phone]   Agree with above CXR stable Please call with questions  Lajuana Matte

## 2019-07-21 LAB — RENAL FUNCTION PANEL
Albumin: 2.4 g/dL — ABNORMAL LOW (ref 3.5–5.0)
Anion gap: 11 (ref 5–15)
BUN: 50 mg/dL — ABNORMAL HIGH (ref 8–23)
CO2: 27 mmol/L (ref 22–32)
Calcium: 8.1 mg/dL — ABNORMAL LOW (ref 8.9–10.3)
Chloride: 96 mmol/L — ABNORMAL LOW (ref 98–111)
Creatinine, Ser: 2.95 mg/dL — ABNORMAL HIGH (ref 0.44–1.00)
GFR calc Af Amer: 18 mL/min — ABNORMAL LOW (ref >60)
GFR calc non Af Amer: 15 mL/min — ABNORMAL LOW (ref >60)
Glucose, Bld: 113 mg/dL — ABNORMAL HIGH (ref 70–99)
Phosphorus: 3 mg/dL (ref 2.5–4.6)
Potassium: 3.7 mmol/L (ref 3.5–5.1)
Sodium: 134 mmol/L — ABNORMAL LOW (ref 135–145)

## 2019-07-21 MED ORDER — HEPARIN SODIUM (PORCINE) 1000 UNIT/ML IJ SOLN
INTRAMUSCULAR | Status: AC
  Administered 2019-07-21: 3800 [IU] via INTRAVENOUS_CENTRAL
  Filled 2019-07-21: qty 4

## 2019-07-21 NOTE — Anesthesia Postprocedure Evaluation (Signed)
Anesthesia Post Note  Patient: Warden/ranger  Procedure(s) Performed: VIDEO ASSISTED THORACOSCOPY Pericardial window  (Right Chest)     Patient location during evaluation: PACU Anesthesia Type: General Level of consciousness: awake and alert Pain management: pain level controlled Vital Signs Assessment: post-procedure vital signs reviewed and stable Respiratory status: spontaneous breathing, nonlabored ventilation, respiratory function stable and patient connected to nasal cannula oxygen Cardiovascular status: blood pressure returned to baseline and stable Postop Assessment: no apparent nausea or vomiting Anesthetic complications: no    Last Vitals:  Vitals:   07/21/19 0602 07/21/19 0842  BP: 135/85 108/80  Pulse: 85 75  Resp:  16  Temp:  36.6 C  SpO2:  97%    Last Pain:  Vitals:   07/21/19 0842  TempSrc: Axillary  PainSc:                  Tiajuana Amass

## 2019-07-21 NOTE — Progress Notes (Signed)
[facility] Progress Note    Assessment/ Plan:   Pt is a 69 y.o. yo female  with history of HTN, former smoker, hepatic steatosis with low albumin, chronic pericardial effusion, moderate AR admitted on 12/17 for progressive dyspnea, anasarca and fatigue, seen as a consultationfor AKIand fluid overload.  #Acute kidney injury,nonoliguric (crt normal in Sept, was 1.43 on 07/03/19) : Likely hemodynamically mediated concomitant with use of IV contrast- U/A not remarkable.  Autoimmune work-up unremarkable in the past. No improvement despite Foley catheter.  Started HD 07/18/19 with IR placed [facility] for progressive uremia.  HD yesterday 12/31--> MS improved, UOP increasing- will make day- by day decision for further HD  #Hyponatremia, hypervolemic: improved with HD, CTM  #Anasarca presumably due to liver disease which was diagnosed at [facility]. She has a history of liver steatosis.No evidence of GN/nephrotic syndrome. 3rd spacing also. Remains on diuretics for now (torsemide)   #Moderate to large pericardial effusion:  s/p pericardial window on 12/23- TCTS following, stable  #Hypertension: Blood pressure acceptable. On low dose Coreg. . . #Anemia: s/p transfusion on 12/22.  gave feraheme and one dose of ESA on 12/23- stable  # Constipation: will order mineral oil enema and miralax--> no results, will order suppository as well. Avoid FLEETs and MOM products   Subjective:    HD last night with 1L off, had 900 mL UOP. Had BM yesterday and feeling much better.     Objective:   BP 108/80 (BP Location: Right Arm)   Pulse 75   Temp 97.9 F (36.6 C) (Axillary)   Resp 16   Ht 5' 6"  (1.676 m)   Wt 49.8 kg   SpO2 97%   BMI 17.72 kg/m   Intake/Output Summary (Last 24 hours) at 07/21/2019 0956 Last data filed at 07/21/2019 2395 Gross per 24 hour  Intake 360 ml  Output 1900 ml  Net -1540 ml   Weight change: -1.4 kg  Physical Exam: Gen: cachectic, fatigued, alert and  awake CVS: RRR, well-healing scars Resp: normal WOB Abd: thin, mildly painful to palpation Ext: anasarca improved ACCESS: L TDC   Imaging: DG Chest 2 View  Result Date: 07/20/2019 CLINICAL DATA:  68 year old female with weakness and shortness of breath. EXAM: CHEST - 2 VIEW COMPARISON:  Chest x-ray 07/19/2019. FINDINGS: Left internal jugular PermCath with tip terminating in the right atrium. Previously noted right-sided chest tube has been removed. There is a right-sided internal jugular central venous catheter with tip terminating in the superior cavoatrial junction. A feeding tube is seen extending into the abdomen, however, the tip of the feeding tube extends below the lower margin of the image. Lung volumes are normal. Bibasilar opacities (left greater than right), which may reflect areas of atelectasis and/or consolidation. Small bilateral pleural effusions. No evidence of pulmonary edema. Heart size is mildly enlarged. Upper mediastinal contours are within normal limits. Aortic atherosclerosis. IMPRESSION: 1. Support apparatus, as above. 2. Bibasilar opacities which may reflect areas of atelectasis and/or consolidation with superimposed small bilateral pleural effusions. 3. Mild cardiomegaly. 4. Aortic atherosclerosis. Electronically Signed   By: Vinnie Langton M.D.   On: 07/20/2019 09:28    Labs: BMET Recent Labs  Lab 07/15/19 0344 07/16/19 0500 07/17/19 0420 07/18/19 0500 07/19/19 0425 07/20/19 0515 07/21/19 0429  NA 133* 128* 127* 124* 132* 129* 134*  K 4.5 4.4 4.4 4.3 3.8 4.0 3.7  CL 92* 89* 88* 84* 94* 90* 96*  CO2 26 26 23 22 24 25 27   GLUCOSE 98  90 89 99 92 139* 113*  BUN 105* 111* 118* 128* 81* 86* 50*  CREATININE 4.15* 4.19* 4.66* 5.10* 4.04* 4.55* 2.95*  CALCIUM 8.8* 8.8* 8.7* 8.7* 8.5* 8.5* 8.1*  PHOS 8.0* 7.1* 7.4* 7.5* 5.0* 5.5* 3.0   CBC Recent Labs  Lab 07/20/19 0515  WBC 9.5  HGB 9.5*  HCT 30.7*  MCV 93.6  PLT 263    Medications:    . bisacodyl   10 mg Oral Daily  . carvedilol  3.125 mg Oral BID WC  . Chlorhexidine Gluconate Cloth  6 each Topical Daily  . Chlorhexidine Gluconate Cloth  6 each Topical Q0600  . collagenase   Topical BID  . darbepoetin (ARANESP) injection - NON-DIALYSIS  100 mcg Subcutaneous Q Fri-1800  . diphenhydrAMINE  25 mg Oral Once  . feeding supplement (ENSURE ENLIVE)  237 mL Oral TID BM  . gabapentin  100 mg Oral TID  . Gerhardt's butt cream   Topical TID  . heparin  5,000 Units Subcutaneous Q8H  . mouth rinse  15 mL Mouth Rinse BID  . polyethylene glycol  17 g Oral Daily  . sertraline  25 mg Oral BID  . sodium chloride flush  3 mL Intravenous Q12H  . sodium chloride flush  3 mL Intravenous Q12H  . torsemide  40 mg Oral Daily      Madelon Lips MD [facility] pgr [phone] 07/21/2019, 9:56 AM

## 2019-07-21 NOTE — Progress Notes (Addendum)
Cardiology Progress Note  Patient ID: Mihira Tozzi MRN: [id number] DOB: [birth year] Date of Encounter: 07/21/2019  Primary Cardiologist: Evalina Field, MD  Subjective  Sleepy this morning.  Will wake up and answer questions appropriately.  Reports she is tired.  Hemodialysis yesterday tolerated well.  ROS:  All other ROS reviewed and negative. Pertinent positives noted in the HPI.     Inpatient Medications  Scheduled Meds: . bisacodyl  10 mg Oral Daily  . carvedilol  3.125 mg Oral BID WC  . Chlorhexidine Gluconate Cloth  6 each Topical Daily  . Chlorhexidine Gluconate Cloth  6 each Topical Q0600  . collagenase   Topical BID  . darbepoetin (ARANESP) injection - NON-DIALYSIS  100 mcg Subcutaneous Q Fri-1800  . diphenhydrAMINE  25 mg Oral Once  . feeding supplement (ENSURE ENLIVE)  237 mL Oral TID BM  . gabapentin  100 mg Oral TID  . Gerhardt's butt cream   Topical TID  . heparin  5,000 Units Subcutaneous Q8H  . mouth rinse  15 mL Mouth Rinse BID  . polyethylene glycol  17 g Oral Daily  . sertraline  25 mg Oral BID  . sodium chloride flush  3 mL Intravenous Q12H  . sodium chloride flush  3 mL Intravenous Q12H  . torsemide  40 mg Oral Daily   Continuous Infusions: . sodium chloride 500 mL (07/06/19 1043)  . sodium chloride    . sodium chloride    . sodium chloride    . feeding supplement (VITAL 1.5 CAL) 1,000 mL (07/20/19 1717)   PRN Meds: sodium chloride, sodium chloride, sodium chloride, sodium chloride, acetaminophen, alteplase, bisacodyl, camphor-menthol, heparin, hydrOXYzine, lidocaine (PF), lidocaine-prilocaine, morphine injection, ondansetron (ZOFRAN) IV, oxyCODONE, pentafluoroprop-tetrafluoroeth, polyvinyl alcohol, senna-docusate, sodium chloride flush, traMADol, witch hazel-glycerin   Vital Signs   Vitals:   07/21/19 0300 07/21/19 0328 07/21/19 0602 07/21/19 0616  BP: 126/76 122/71 135/85   Pulse: 78 79 85   Resp:  (!) 24    Temp:  98.3 F (36.8 C)    TempSrc:   Oral    SpO2:  97%    Weight:  49.8 kg  49.8 kg  Height:        Intake/Output Summary (Last 24 hours) at 07/21/2019 0744 Last data filed at 07/21/2019 0618 Gross per 24 hour  Intake 360 ml  Output 1900 ml  Net -1540 ml   Last 3 Weights 07/21/2019 07/21/2019 07/21/2019  Weight (lbs) 109 lb 12.6 oz 109 lb 12.6 oz 112 lb 14 oz  Weight (kg) 49.8 kg 49.8 kg 51.2 kg      Telemetry  Overnight telemetry shows normal sinus rhythm with heart rate in the 70s, which I personally reviewed.   Physical Exam   Vitals:   07/21/19 0300 07/21/19 0328 07/21/19 0602 07/21/19 0616  BP: 126/76 122/71 135/85   Pulse: 78 79 85   Resp:  (!) 24    Temp:  98.3 F (36.8 C)    TempSrc:  Oral    SpO2:  97%    Weight:  49.8 kg  49.8 kg  Height:         Intake/Output Summary (Last 24 hours) at 07/21/2019 0744 Last data filed at 07/21/2019 0618 Gross per 24 hour  Intake 360 ml  Output 1900 ml  Net -1540 ml    Last 3 Weights 07/21/2019 07/21/2019 07/21/2019  Weight (lbs) 109 lb 12.6 oz 109 lb 12.6 oz 112 lb 14 oz  Weight (kg) 49.8 kg 49.8 kg  51.2 kg    Body mass index is 17.72 kg/m.  General: Frail, ill-appearing  Head: Atraumatic, normal size  Eyes: PEERLA, EOMI  Neck: Supple, no JVD Endocrine: No thryomegaly Cardiac: Normal S1, S2; RRR; no murmurs, rubs, or gallops Lungs: Diminished breath sounds at the lung bases bilaterally Abd: Soft, nontender, no hepatomegaly  Ext: No edema, pulses 2+ Musculoskeletal: No deformities, BUE and BLE strength normal and equal Skin: Warm and dry, no rashes   Neuro: Alert and oriented to person, place, time, and situation, CNII-XII grossly intact, no focal deficits  Psych: Normal mood and affect   Labs  High Sensitivity Troponin:   Recent Labs  Lab 07/03/19 1739 07/03/19 1930  TROPONINIHS 149* 163*     Cardiac EnzymesNo results for input(s): TROPONINI in the last 168 hours. No results for input(s): TROPIPOC in the last 168 hours.  Chemistry Recent Labs  Lab  07/19/19 0425 07/20/19 0515 07/21/19 0429  NA 132* 129* 134*  K 3.8 4.0 3.7  CL 94* 90* 96*  CO2 24 25 27   GLUCOSE 92 139* 113*  BUN 81* 86* 50*  CREATININE 4.04* 4.55* 2.95*  CALCIUM 8.5* 8.5* 8.1*  ALBUMIN 2.3* 2.2* 2.4*  GFRNONAA 11* 9* 15*  GFRAA 12* 11* 18*  ANIONGAP 14 14 11     Hematology Recent Labs  Lab 07/20/19 0515  WBC 9.5  RBC 3.28*  HGB 9.5*  HCT 30.7*  MCV 93.6  MCH 29.0  MCHC 30.9  RDW 19.0*  PLT 263   BNPNo results for input(s): BNP, PROBNP in the last 168 hours.  DDimer No results for input(s): DDIMER in the last 168 hours.   Radiology  DG Chest 2 View  Result Date: 07/20/2019 CLINICAL DATA:  69 year old female with weakness and shortness of breath. EXAM: CHEST - 2 VIEW COMPARISON:  Chest x-ray 07/19/2019. FINDINGS: Left internal jugular PermCath with tip terminating in the right atrium. Previously noted right-sided chest tube has been removed. There is a right-sided internal jugular central venous catheter with tip terminating in the superior cavoatrial junction. A feeding tube is seen extending into the abdomen, however, the tip of the feeding tube extends below the lower margin of the image. Lung volumes are normal. Bibasilar opacities (left greater than right), which may reflect areas of atelectasis and/or consolidation. Small bilateral pleural effusions. No evidence of pulmonary edema. Heart size is mildly enlarged. Upper mediastinal contours are within normal limits. Aortic atherosclerosis. IMPRESSION: 1. Support apparatus, as above. 2. Bibasilar opacities which may reflect areas of atelectasis and/or consolidation with superimposed small bilateral pleural effusions. 3. Mild cardiomegaly. 4. Aortic atherosclerosis. Electronically Signed   By: Vinnie Langton M.D.   On: 07/20/2019 09:28    Cardiac Studies  TTE 07/17/2019  1. Left ventricular ejection fraction, by visual estimation, is 50 to 55%. The left ventricle has low normal function. There is  mildly increased left ventricular hypertrophy.  2. Moderate hypokinesis of the left ventricular, entire septal wall.  3. The left ventricle demonstrates regional wall motion abnormalities.  4. Global right ventricle has normal systolic function.The right ventricular size is normal. No increase in right ventricular wall thickness.  5. Left atrial size was normal.  6. Right atrial size was mildly dilated.  7. Moderate pleural effusion in the left lateral region.  8. The pericardial effusion is circumferential.  9. Trivial pericardial effusion is present. 10. The mitral valve is abnormal. Mild mitral valve regurgitation. 11. The tricuspid valve is grossly normal. Tricuspid valve regurgitation moderate. 12.  Aortic valve regurgitation is moderate. 13. The aortic valve is tricuspid. Aortic valve regurgitation is moderate. Mild aortic valve sclerosis without stenosis. 14. The pulmonic valve was grossly normal. Pulmonic valve regurgitation is not visualized. 15. Moderately elevated pulmonary artery systolic pressure. 16. The inferior vena cava is dilated in size with >50% respiratory variability, suggesting right atrial pressure of 8 mmHg.  Patient Profile  Naziya Hegwood is a 69 y.o. female with history of anasarca, mild hepatic steatosis, moderate aortic insufficiency, pericardial effusion who was admitted on 12/14 for volume overload and tachycardia.  She subsequently suffered AKI now on hemodialysis.  She is also status post pericardial window for pericardial effusion.  Etiology for anasarca appears to be liver disease and likely poor p.o. intake and low protein.  AKI is related to contrast dye from CT PE study.  Assessment & Plan  1.  Anasarca/gross volume overload: Complicated course.  Has had extensive rheumatologic work-up which is negative.  Has had liver biopsy at [facility] which shows mild hepatic steatosis.  Negative EGD biopsy for amyloidosis at [facility].  Large pericardial effusion status post  window.  Moderate aortic insufficiency.  Likely combination of liver disease as well as low protein.  No identifiable inflammatory disorder or GN syndrome to explain.  Her volume status is acceptable on hemodialysis.  She is also on tube feeds to see if this can help with nutrition. 2.  Large pericardial effusion status post pericardial window: She underwent right and left heart catheterization and hemodynamics do not support tamponade.  Due to worsening overall states she did undergo a pericardial window.  She had no improvement in kidney function or overall state since this.  This likely was secondary bystander in the overall process. 3.  Moderate aortic insufficiency: Stable on most recent echocardiogram. 4.  Protein deficiency: High-protein diet as well as Dobbhoff to help with protein deficiency. 5.  AKI on hemodialysis: Possibly related to volume overload and contrast load from CT PE study on admission.  Tolerating HD well and managed by nephrology.  Hoping she will recover and this would not be a long-term issue for her.  FEN -no IVF -daily BMP -dvt ppx: heparin -code: full   For questions or updates, please contact [place] Please consult [email or web address] for contact info under   Signed, [place] Bells T. Audie Box, [place]  07/21/2019 7:44 AM

## 2019-07-22 ENCOUNTER — Inpatient Hospital Stay (HOSPITAL_COMMUNITY): Payer: Medicare Other

## 2019-07-22 DIAGNOSIS — I314 Cardiac tamponade: Secondary | ICD-10-CM

## 2019-07-22 DIAGNOSIS — I10 Essential (primary) hypertension: Secondary | ICD-10-CM

## 2019-07-22 LAB — RENAL FUNCTION PANEL
Albumin: 2.1 g/dL — ABNORMAL LOW (ref 3.5–5.0)
Anion gap: 15 (ref 5–15)
BUN: 64 mg/dL — ABNORMAL HIGH (ref 8–23)
CO2: 23 mmol/L (ref 22–32)
Calcium: 8.1 mg/dL — ABNORMAL LOW (ref 8.9–10.3)
Chloride: 91 mmol/L — ABNORMAL LOW (ref 98–111)
Creatinine, Ser: 3.68 mg/dL — ABNORMAL HIGH (ref 0.44–1.00)
GFR calc Af Amer: 14 mL/min — ABNORMAL LOW (ref >60)
GFR calc non Af Amer: 12 mL/min — ABNORMAL LOW (ref >60)
Glucose, Bld: 119 mg/dL — ABNORMAL HIGH (ref 70–99)
Phosphorus: 3.5 mg/dL (ref 2.5–4.6)
Potassium: 4.5 mmol/L (ref 3.5–5.1)
Sodium: 129 mmol/L — ABNORMAL LOW (ref 135–145)

## 2019-07-22 MED ORDER — POLYETHYLENE GLYCOL 3350 17 G PO PACK
17.0000 g | PACK | Freq: Every day | ORAL | Status: DC | PRN
  Filled 2019-07-22: qty 1

## 2019-07-22 MED ORDER — BISACODYL 5 MG PO TBEC: 10.0000 mg | DELAYED_RELEASE_TABLET | Freq: Every day | ORAL | Status: DC | PRN

## 2019-07-22 NOTE — Progress Notes (Signed)
Sylvan Cheese called and notified that the patient's HD tx has been moved to 07/23/19.

## 2019-07-22 NOTE — Progress Notes (Signed)
Progress Note  [name] Date of Encounter: 07/22/2019  Primary Cardiologist: Evalina Field, MD   Subjective   Feeling tired.  Frustrated that her albumin isn't going up.  Noted drainage from her prior drain sites.    Inpatient Medications    Scheduled Meds: . carvedilol  3.125 mg Oral BID WC  . Chlorhexidine Gluconate Cloth  6 each Topical Daily  . Chlorhexidine Gluconate Cloth  6 each Topical Q0600  . collagenase   Topical BID  . darbepoetin (ARANESP) injection - NON-DIALYSIS  100 mcg Subcutaneous Q Fri-1800  . diphenhydrAMINE  25 mg Oral Once  . feeding supplement (ENSURE ENLIVE)  237 mL Oral TID BM  . gabapentin  100 mg Oral TID  . Gerhardt's butt cream   Topical TID  . heparin  5,000 Units Subcutaneous Q8H  . mouth rinse  15 mL Mouth Rinse BID  . sertraline  25 mg Oral BID  . sodium chloride flush  3 mL Intravenous Q12H  . sodium chloride flush  3 mL Intravenous Q12H  . torsemide  40 mg Oral Daily   Continuous Infusions: . sodium chloride 500 mL (07/06/19 1043)  . sodium chloride    . sodium chloride    . sodium chloride    . feeding supplement (VITAL 1.5 CAL) 1,000 mL (07/21/19 1842)   PRN Meds: sodium chloride, sodium chloride, sodium chloride, sodium chloride, acetaminophen, alteplase, bisacodyl, bisacodyl, camphor-menthol, heparin, hydrOXYzine, lidocaine (PF), lidocaine-prilocaine, morphine injection, ondansetron (ZOFRAN) IV, oxyCODONE, pentafluoroprop-tetrafluoroeth, polyethylene glycol, polyvinyl alcohol, senna-docusate, sodium chloride flush, traMADol, witch hazel-glycerin   Vital Signs    Vitals:   07/21/19 2323 07/22/19 0401 07/22/19 0655 07/22/19 0700  BP: 120/74 136/89 116/78 132/80  Pulse: 83 84 80 83  Resp: (!) 21 18    Temp: (!) 97.4 F (36.3 C) (!) 97 F (36.1 C)  97.9 F (36.6 C)  TempSrc: Axillary Axillary  Oral  SpO2: 97% 98%  97%  Weight:      Height:        Intake/Output Summary (Last 24 hours) at 07/22/2019  1054 Last data filed at 07/22/2019 0401 Gross per 24 hour  Intake 360 ml  Output 100 ml  Net 260 ml   Last 3 Weights 07/21/2019 07/21/2019 07/21/2019  Weight (lbs) 109 lb 12.6 oz 109 lb 12.6 oz 112 lb 14 oz  Weight (kg) 49.8 kg 49.8 kg 51.2 kg      Telemetry    Sinus rhythm.  No events - Personally Reviewed  ECG    n/a - Personally Reviewed  Physical Exam   VS:  BP 132/80 (BP Location: Right Arm)   Pulse 83   Temp 97.9 F (36.6 C) (Oral)   Resp 18   Ht 5' 6"  (1.676 m)   Wt 49.8 kg   SpO2 97%   BMI 17.72 kg/m  , BMI Body mass index is 17.72 kg/m. GENERAL:  Frail, chronically ill-appearing woman in no acute distress.  Weak. HEENT: Pupils equal round and reactive, fundi not visualized, oral mucosa unremarkable NECK:  No jugular venous distention, waveform within normal limits, carotid upstroke brisk and symmetric, no bruits LUNGS:  Crackles at right base HEART:  RRR.  PMI not displaced or sustained,S1 and S2 within normal limits, no S3, no S4, no clicks, no rubs, no murmurs ABD:  Flat, positive bowel sounds normal in frequency in pitch, no bruits, no rebound, no guarding, no midline pulsatile mass, no hepatomegaly, no splenomegaly EXT:  2 plus  pulses throughout, 2+ LE edema, no cyanosis no clubbing SKIN:  No rashes no nodules NEURO:  Cranial nerves II through XII grossly intact, motor grossly intact throughout PSYCH:  Cognitively intact, oriented to person place and time   Labs    High Sensitivity Troponin:   Recent Labs  Lab 07/03/19 1739 07/03/19 1930  TROPONINIHS 149* 163*      Chemistry Recent Labs  Lab 07/20/19 0515 07/21/19 0429 07/22/19 0347  NA 129* 134* 129*  K 4.0 3.7 4.5  CL 90* 96* 91*  CO2 25 27 23   GLUCOSE 139* 113* 119*  BUN 86* 50* 64*  CREATININE 4.55* 2.95* 3.68*  CALCIUM 8.5* 8.1* 8.1*  ALBUMIN 2.2* 2.4* 2.1*  GFRNONAA 9* 15* 12*  GFRAA 11* 18* 14*  ANIONGAP 14 11 15      Hematology Recent Labs  Lab 07/20/19 0515  WBC 9.5  RBC  3.28*  HGB 9.5*  HCT 30.7*  MCV 93.6  MCH 29.0  MCHC 30.9  RDW 19.0*  PLT 263    BNPNo results for input(s): BNP, PROBNP in the last 168 hours.   DDimer No results for input(s): DDIMER in the last 168 hours.   Radiology    No results found.  Cardiac Studies   Echo 07/17/19: IMPRESSIONS   1. Left ventricular ejection fraction, by visual estimation, is 50 to 55%. The left ventricle has low normal function. There is mildly increased left ventricular hypertrophy.  2. Moderate hypokinesis of the left ventricular, entire septal wall.  3. The left ventricle demonstrates regional wall motion abnormalities.  4. Global right ventricle has normal systolic function.The right ventricular size is normal. No increase in right ventricular wall thickness.  5. Left atrial size was normal.  6. Right atrial size was mildly dilated.  7. Moderate pleural effusion in the left lateral region.  8. The pericardial effusion is circumferential.  9. Trivial pericardial effusion is present. 10. The mitral valve is abnormal. Mild mitral valve regurgitation. 11. The tricuspid valve is grossly normal. Tricuspid valve regurgitation moderate. 12. Aortic valve regurgitation is moderate. 13. The aortic valve is tricuspid. Aortic valve regurgitation is moderate. Mild aortic valve sclerosis without stenosis. 14. The pulmonic valve was grossly normal. Pulmonic valve regurgitation is not visualized. 15. Moderately elevated pulmonary artery systolic pressure. 16. The inferior vena cava is dilated in size with >50% respiratory variability, suggesting right atrial pressure of 8 mmHg.  RHC/LHC 07/06/19: Findings:  RA = 7  RV = 42/7 PA = 48/17 (28) PCW = 16 LV = 122/8 Fick cardiac output/index = 5.0/3.2 PVR = 2.4 WU FA sat = 98% PA sat = 62%, 62%  Prominent y-descents in RA tracing but no square root sign in ventricular tracings and no RV/LV interdeprendence  After 350 cc NS bolus  RA = 9 RV =  46/9 PA = 48/24 (33) PCW = 23 LV = 125/15  Prominent y-descents in RA tracing but no square root sign in ventricular tracings and no RV/LV interdependence  Assessment: 1. Moderate pericardial effusion 2. Mild PAH 3. Normal filling pressures with no evidence of tamponade or constrictive physiology before/after fluid loading  Patient Profile     69 y.o. female with moderate AR, anasarca, mild hepatic steatosis admitted 12/14 with volume overload and tachycardia.  Hospitalization has been complicated by AKI now requiring HD and pericardial effusion s/p pericardial windown.  Assessment & Plan    # Anasarca: Thought to be due to liver disease and poor oral intake with low protein.  She had an extensie rheumatologic work up that was negative.  Liver biopsy at [facility] showed mild hepatic statosis.  Negative EGD biopsy for amyloidosis.  She is now getting tube feeds.  Appetite is poor and albumin is not improving.  Volume removal with HD as tolerated.  # Large pericardial effusion: S/p pericardial window.  No tamponade on LHC/RHC.  However, she underwent pericardial window due to worsening clinical course.  No renal improvement after the procedure.  She now has drainage from where the pericardial and pleural tubes were.  We will get a CXR to ensure there isn't a worsening pleural effusion.   # Moderate AR: Diuresis with HD as above.    # Hypertension: BP stable on carvedilol.  # AKI: Thought to be related to hypotension and IV contrast.  Autoimmune work up has been negative in the past.  No renal recovery yet.  Going for HD again today.   For questions or updates, please contact [place] Please consult [email or web address] for contact info under        Signed, Skeet Latch, MD  07/22/2019, 10:54 AM

## 2019-07-22 NOTE — Progress Notes (Signed)
[facility] Progress Note    Assessment/ Plan:   Pt is a 69 y.o. yo female  with history of HTN, former smoker, hepatic steatosis with low albumin, chronic pericardial effusion, moderate AR admitted on 12/17 for progressive dyspnea, anasarca and fatigue, seen as a consultationfor AKIand fluid overload.  #Acute kidney injury,nonoliguric (crt normal in Sept, was 1.43 on 07/03/19) : Likely hemodynamically mediated concomitant with use of IV contrast- U/A not remarkable.  Autoimmune work-up unremarkable in the past. No improvement despite Foley catheter.  Started HD 07/18/19 with IR placed [facility] for progressive uremia.  HD 12/31--> MS improved, next HD today 07/22/2019  #Hyponatremia, hypervolemic: improved with HD, CTM  #Anasarca presumably due to liver disease which was diagnosed at [facility]. She has a history of liver steatosis.No evidence of GN/nephrotic syndrome. 3rd spacing also. Remains on diuretics for now (torsemide)   #Moderate to large pericardial effusion:  s/p pericardial window on 12/23- TCTS following, stable  #Hypertension: Blood pressure acceptable. On low dose Coreg. . . #Anemia: s/p transfusion on 12/22.  gave feraheme and one dose of ESA on 12/23- stable  # Constipation: resolved   Subjective:    UOP trailed yesterday, still has sig flank edema.  Will plan for HD today.  D/w pt.  She is overall feeling better.     Objective:   BP 132/80 (BP Location: Right Arm)   Pulse 83   Temp 97.9 F (36.6 C) (Oral)   Resp 18   Ht 5' 6"  (1.676 m)   Wt 49.8 kg   SpO2 97%   BMI 17.72 kg/m   Intake/Output Summary (Last 24 hours) at 07/22/2019 0840 Last data filed at 07/22/2019 0401 Gross per 24 hour  Intake 540 ml  Output 100 ml  Net 440 ml   Weight change:   Physical Exam: Gen: cachectic, fatigued, alert and awake CVS: RRR, well-healing scars Resp: normal WOB Abd: thin, mildly painful to palpation Ext: anasarca improved but still with significant  flank edema ACCESS: L TDC   Imaging: No results found.  Labs: BMET Recent Labs  Lab 07/16/19 0500 07/17/19 0420 07/18/19 0500 07/19/19 0425 07/20/19 0515 07/21/19 0429 07/22/19 0347  NA 128* 127* 124* 132* 129* 134* 129*  K 4.4 4.4 4.3 3.8 4.0 3.7 4.5  CL 89* 88* 84* 94* 90* 96* 91*  CO2 26 23 22 24 25 27 23   GLUCOSE 90 89 99 92 139* 113* 119*  BUN 111* 118* 128* 81* 86* 50* 64*  CREATININE 4.19* 4.66* 5.10* 4.04* 4.55* 2.95* 3.68*  CALCIUM 8.8* 8.7* 8.7* 8.5* 8.5* 8.1* 8.1*  PHOS 7.1* 7.4* 7.5* 5.0* 5.5* 3.0 3.5   CBC Recent Labs  Lab 07/20/19 0515  WBC 9.5  HGB 9.5*  HCT 30.7*  MCV 93.6  PLT 263    Medications:    . carvedilol  3.125 mg Oral BID WC  . Chlorhexidine Gluconate Cloth  6 each Topical Daily  . Chlorhexidine Gluconate Cloth  6 each Topical Q0600  . collagenase   Topical BID  . darbepoetin (ARANESP) injection - NON-DIALYSIS  100 mcg Subcutaneous Q Fri-1800  . diphenhydrAMINE  25 mg Oral Once  . feeding supplement (ENSURE ENLIVE)  237 mL Oral TID BM  . gabapentin  100 mg Oral TID  . Gerhardt's butt cream   Topical TID  . heparin  5,000 Units Subcutaneous Q8H  . mouth rinse  15 mL Mouth Rinse BID  . sertraline  25 mg Oral BID  . sodium chloride  flush  3 mL Intravenous Q12H  . sodium chloride flush  3 mL Intravenous Q12H  . torsemide  40 mg Oral Daily      [facility] pgr [phone] 07/22/2019, 8:40 AM

## 2019-07-22 NOTE — Social Work (Signed)
CSW acknowledging consult for [facility] HF screen. Will follow for therapy recommendations needed to best determine disposition/for insurance authorization.   Joanna Ali, MSW, [place] [employer]

## 2019-07-23 LAB — RENAL FUNCTION PANEL
Albumin: 2.1 g/dL — ABNORMAL LOW (ref 3.5–5.0)
Anion gap: 11 (ref 5–15)
BUN: 87 mg/dL — ABNORMAL HIGH (ref 8–23)
CO2: 26 mmol/L (ref 22–32)
Calcium: 8.3 mg/dL — ABNORMAL LOW (ref 8.9–10.3)
Chloride: 90 mmol/L — ABNORMAL LOW (ref 98–111)
Creatinine, Ser: 4.45 mg/dL — ABNORMAL HIGH (ref 0.44–1.00)
GFR calc Af Amer: 11 mL/min — ABNORMAL LOW (ref >60)
GFR calc non Af Amer: 9 mL/min — ABNORMAL LOW (ref >60)
Glucose, Bld: 112 mg/dL — ABNORMAL HIGH (ref 70–99)
Phosphorus: 3.8 mg/dL (ref 2.5–4.6)
Potassium: 6.1 mmol/L — ABNORMAL HIGH (ref 3.5–5.1)
Sodium: 127 mmol/L — ABNORMAL LOW (ref 135–145)

## 2019-07-23 LAB — CRYOGLOBULIN

## 2019-07-23 MED ORDER — HEPARIN SODIUM (PORCINE) 1000 UNIT/ML IJ SOLN
INTRAMUSCULAR | Status: AC
  Administered 2019-07-23: 1000 [IU] via INTRAVENOUS_CENTRAL
  Filled 2019-07-23: qty 4

## 2019-07-23 MED ORDER — SODIUM ZIRCONIUM CYCLOSILICATE 10 G PO PACK
10.0000 g | PACK | Freq: Once | ORAL | Status: AC
  Administered 2019-07-26: 10 g via ORAL
  Filled 2019-07-23: qty 1

## 2019-07-23 MED ORDER — SODIUM ZIRCONIUM CYCLOSILICATE 5 G PO PACK
5.0000 g | PACK | Freq: Once | ORAL | Status: AC
  Administered 2019-07-23: 5 g via ORAL
  Filled 2019-07-23: qty 1

## 2019-07-23 MED ORDER — ALUM & MAG HYDROXIDE-SIMETH 200-200-20 MG/5ML PO SUSP
30.0000 mL | ORAL | Status: DC | PRN
  Administered 2019-07-23: 30 mL via ORAL
  Filled 2019-07-23: qty 30

## 2019-07-23 MED ORDER — PROCHLORPERAZINE MALEATE 5 MG PO TABS
5.0000 mg | ORAL_TABLET | Freq: Once | ORAL | Status: DC
  Filled 2019-07-23: qty 1

## 2019-07-23 NOTE — Progress Notes (Signed)
[facility] Progress Note    Assessment/ Plan:   Pt is a 69 y.o. yo female  with history of HTN, former smoker, hepatic steatosis with low albumin, chronic pericardial effusion, moderate AR admitted on 12/17 for progressive dyspnea, anasarca and fatigue, seen as a consultationfor AKIand fluid overload.  #Acute kidney injury,nonoliguric (crt normal in Sept, was 1.43 on 07/03/19) : Likely hemodynamically mediated concomitant with use of IV contrast- U/A not remarkable.  Autoimmune work-up unremarkable in the past. No improvement despite Foley catheter.  Started HD 07/18/19 with IR placed [facility] for progressive uremia.  HD 12/31--> MS improved, next HD today 07/23/2019  #Hyponatremia, hypervolemic: improved with HD, CTM  #Anasarca presumably due to liver disease which was diagnosed at [facility]. She has a history of liver steatosis.No evidence of GN/nephrotic syndrome. 3rd spacing also. Remains on diuretics for now (torsemide)   #Moderate to large pericardial effusion:  s/p pericardial window on 12/23- TCTS following, stable  #Hypertension: Blood pressure acceptable. On low dose Coreg. . . #Anemia: s/p transfusion on 12/22.  gave feraheme and one dose of ESA on 12/23- stable  # Constipation: resolved   Subjective:    Bumped from HD schedule yesterday d/t volume of emergent patients, getting HD for today.    Objective:   BP 130/80 (BP Location: Left Arm)   Pulse 80   Temp 97.7 F (36.5 C) (Oral)   Resp 20   Ht 5' 6"  (1.676 m)   Wt 53.8 kg   SpO2 94%   BMI 19.14 kg/m   Intake/Output Summary (Last 24 hours) at 07/23/2019 9628 Last data filed at 07/23/2019 0800 Gross per 24 hour  Intake 2035 ml  Output 100 ml  Net 1935 ml   Weight change:   Physical Exam: Gen: cachectic, fatigued, just waking up CVS: RRR, well-healing scars Resp: normal WOB Abd: thin, mildly painful to palpation Ext: significant flank edema ACCESS: L [facility]   Imaging: DG CHEST PORT 1  VIEW  Result Date: 07/22/2019 CLINICAL DATA:  SOB, palpitations. EXAM: PORTABLE CHEST 1 VIEW COMPARISON:  Chest radiograph 07/20/2019 FINDINGS: Stable support apparatus. Unchanged cardiomediastinal contours with enlarged heart size. Persistent heterogeneous opacities at the right lung base and consolidation at the left lung base. Probable small bilateral effusions. No evidence of pneumothorax. IMPRESSION: Stable chest with bibasilar opacities and probable small pleural effusions. Electronically Signed   By: Audie Pinto M.D.   On: 07/22/2019 13:13    Labs: BMET Recent Labs  Lab 07/17/19 0420 07/18/19 0500 07/19/19 0425 07/20/19 0515 07/21/19 0429 07/22/19 0347 07/23/19 0350  NA 127* 124* 132* 129* 134* 129* 127*  K 4.4 4.3 3.8 4.0 3.7 4.5 6.1*  CL 88* 84* 94* 90* 96* 91* 90*  CO2 23 22 24 25 27 23 26   GLUCOSE 89 99 92 139* 113* 119* 112*  BUN 118* 128* 81* 86* 50* 64* 87*  CREATININE 4.66* 5.10* 4.04* 4.55* 2.95* 3.68* 4.45*  CALCIUM 8.7* 8.7* 8.5* 8.5* 8.1* 8.1* 8.3*  PHOS 7.4* 7.5* 5.0* 5.5* 3.0 3.5 3.8   CBC Recent Labs  Lab 07/20/19 0515  WBC 9.5  HGB 9.5*  HCT 30.7*  MCV 93.6  PLT 263    Medications:    . carvedilol  3.125 mg Oral BID WC  . Chlorhexidine Gluconate Cloth  6 each Topical Daily  . Chlorhexidine Gluconate Cloth  6 each Topical Q0600  . collagenase   Topical BID  . darbepoetin (ARANESP) injection - NON-DIALYSIS  100 mcg Subcutaneous Q Fri-1800  .  diphenhydrAMINE  25 mg Oral Once  . feeding supplement (ENSURE ENLIVE)  237 mL Oral TID BM  . gabapentin  100 mg Oral TID  . Gerhardt's butt cream   Topical TID  . heparin  5,000 Units Subcutaneous Q8H  . mouth rinse  15 mL Mouth Rinse BID  . sertraline  25 mg Oral BID  . sodium chloride flush  3 mL Intravenous Q12H  . sodium chloride flush  3 mL Intravenous Q12H  . sodium zirconium cyclosilicate  10 g Oral Once  . torsemide  40 mg Oral Daily      Madelon Lips MD [facility] pgr [phone] 07/23/2019, 9:06 AM

## 2019-07-23 NOTE — Progress Notes (Signed)
Patient called out complaining of sudden sore throat.  Stated "I think I have strep throat".  Patient educated that pain from strep throat would unlikely come on as sudden as she described.  She then stated that "it feels like pepper in the back".  Ice cubes provided per patient request.  Zofran given while order for Maalox being processed by pharmacy.  This provided no relief.  Maalox given, medication effective. Will continue to monitor.

## 2019-07-23 NOTE — Progress Notes (Signed)
Cardiology Progress Note  Patient ID: Joanna Ali MRN: [id number] DOB: [birth year] Date of Encounter: 07/23/2019  Primary Cardiologist: Evalina Field, MD  Subjective  Tolerating dialysis. Reports drainage from L chest tube site. Tube removed several days ago. Stitch noted but no drainage. Reports HD going ok. No CP, SOB, or palpitations reported.   ROS:  All other ROS reviewed and negative. Pertinent positives noted in the HPI.     Inpatient Medications  Scheduled Meds: . carvedilol  3.125 mg Oral BID WC  . Chlorhexidine Gluconate Cloth  6 each Topical Daily  . Chlorhexidine Gluconate Cloth  6 each Topical Q0600  . collagenase   Topical BID  . darbepoetin (ARANESP) injection - NON-DIALYSIS  100 mcg Subcutaneous Q Fri-1800  . diphenhydrAMINE  25 mg Oral Once  . feeding supplement (ENSURE ENLIVE)  237 mL Oral TID BM  . gabapentin  100 mg Oral TID  . Gerhardt's butt cream   Topical TID  . heparin  5,000 Units Subcutaneous Q8H  . mouth rinse  15 mL Mouth Rinse BID  . prochlorperazine  5 mg Oral Once  . sertraline  25 mg Oral BID  . sodium chloride flush  3 mL Intravenous Q12H  . sodium chloride flush  3 mL Intravenous Q12H  . sodium zirconium cyclosilicate  10 g Oral Once  . torsemide  40 mg Oral Daily   Continuous Infusions: . sodium chloride 500 mL (07/06/19 1043)  . sodium chloride    . sodium chloride    . sodium chloride    . feeding supplement (VITAL 1.5 CAL) 1,000 mL (07/23/19 1200)   PRN Meds: sodium chloride, sodium chloride, sodium chloride, sodium chloride, acetaminophen, alteplase, bisacodyl, bisacodyl, camphor-menthol, heparin, hydrOXYzine, lidocaine (PF), lidocaine-prilocaine, morphine injection, ondansetron (ZOFRAN) IV, oxyCODONE, pentafluoroprop-tetrafluoroeth, polyethylene glycol, polyvinyl alcohol, senna-docusate, sodium chloride flush, traMADol, witch hazel-glycerin   Vital Signs   Vitals:   07/23/19 1030 07/23/19 1100 07/23/19 1138 07/23/19 1254  BP:  (!) 91/58 95/65 111/78 112/74  Pulse: 94 96 83 87  Resp:   20 (!) 25  Temp:   97.7 F (36.5 C) (!) 97 F (36.1 C)  TempSrc:   Oral Axillary  SpO2:   98% 96%  Weight:   51.4 kg   Height:        Intake/Output Summary (Last 24 hours) at 07/23/2019 1611 Last data filed at 07/23/2019 1200 Gross per 24 hour  Intake 1265 ml  Output 2288 ml  Net -1023 ml   Last 3 Weights 07/23/2019 07/23/2019 07/23/2019  Weight (lbs) 113 lb 5.1 oz 118 lb 9.7 oz 114 lb 6.7 oz  Weight (kg) 51.4 kg 53.8 kg 51.9 kg      Telemetry  Overnight telemetry shows normal sinus rhythm with heart rate in the 70s, which I personally reviewed.   Physical Exam   Vitals:   07/23/19 1030 07/23/19 1100 07/23/19 1138 07/23/19 1254  BP: (!) 91/58 95/65 111/78 112/74  Pulse: 94 96 83 87  Resp:   20 (!) 25  Temp:   97.7 F (36.5 C) (!) 97 F (36.1 C)  TempSrc:   Oral Axillary  SpO2:   98% 96%  Weight:   51.4 kg   Height:         Intake/Output Summary (Last 24 hours) at 07/23/2019 1611 Last data filed at 07/23/2019 1200 Gross per 24 hour  Intake 1265 ml  Output 2288 ml  Net -1023 ml    Last 3 Weights 07/23/2019 07/23/2019 07/23/2019  Weight (lbs) 113 lb 5.1 oz 118 lb 9.7 oz 114 lb 6.7 oz  Weight (kg) 51.4 kg 53.8 kg 51.9 kg    Body mass index is 18.29 kg/m.  General: Frail, ill-appearing  Head: Atraumatic, normal size  Eyes: PEERLA, EOMI  Neck: Supple, no JVD Endocrine: No thryomegaly Cardiac: Normal S1, S2; RRR; no murmurs, rubs, or gallops Lungs: Diminished breath sounds at the lung bases bilaterally Abd: Soft, nontender, no hepatomegaly  Ext: No edema, pulses 2+ Musculoskeletal: No deformities, BUE and BLE strength normal and equal Skin: Warm and dry, no rashes   Neuro: Alert and oriented to person, place, time, and situation, CNII-XII grossly intact, no focal deficits  Psych: Normal mood and affect   Labs  High Sensitivity Troponin:   Recent Labs  Lab 07/03/19 1739 07/03/19 1930  TROPONINIHS 149* 163*       Cardiac EnzymesNo results for input(s): TROPONINI in the last 168 hours. No results for input(s): TROPIPOC in the last 168 hours.  Chemistry Recent Labs  Lab 07/21/19 0429 07/22/19 0347 07/23/19 0350  NA 134* 129* 127*  K 3.7 4.5 6.1*  CL 96* 91* 90*  CO2 27 23 26   GLUCOSE 113* 119* 112*  BUN 50* 64* 87*  CREATININE 2.95* 3.68* 4.45*  CALCIUM 8.1* 8.1* 8.3*  ALBUMIN 2.4* 2.1* 2.1*  GFRNONAA 15* 12* 9*  GFRAA 18* 14* 11*  ANIONGAP 11 15 11     Hematology Recent Labs  Lab 07/20/19 0515  WBC 9.5  RBC 3.28*  HGB 9.5*  HCT 30.7*  MCV 93.6  MCH 29.0  MCHC 30.9  RDW 19.0*  PLT 263   BNPNo results for input(s): BNP, PROBNP in the last 168 hours.  DDimer No results for input(s): DDIMER in the last 168 hours.   Radiology  DG CHEST PORT 1 VIEW  Result Date: 07/22/2019 CLINICAL DATA:  SOB, palpitations. EXAM: PORTABLE CHEST 1 VIEW COMPARISON:  Chest radiograph 07/20/2019 FINDINGS: Stable support apparatus. Unchanged cardiomediastinal contours with enlarged heart size. Persistent heterogeneous opacities at the right lung base and consolidation at the left lung base. Probable small bilateral effusions. No evidence of pneumothorax. IMPRESSION: Stable chest with bibasilar opacities and probable small pleural effusions. Electronically Signed   By: Audie Pinto M.D.   On: 07/22/2019 13:13    Cardiac Studies  TTE 07/17/2019  1. Left ventricular ejection fraction, by visual estimation, is 50 to 55%. The left ventricle has low normal function. There is mildly increased left ventricular hypertrophy.  2. Moderate hypokinesis of the left ventricular, entire septal wall.  3. The left ventricle demonstrates regional wall motion abnormalities.  4. Global right ventricle has normal systolic function.The right ventricular size is normal. No increase in right ventricular wall thickness.  5. Left atrial size was normal.  6. Right atrial size was mildly dilated.  7. Moderate pleural effusion  in the left lateral region.  8. The pericardial effusion is circumferential.  9. Trivial pericardial effusion is present. 10. The mitral valve is abnormal. Mild mitral valve regurgitation. 11. The tricuspid valve is grossly normal. Tricuspid valve regurgitation moderate. 12. Aortic valve regurgitation is moderate. 13. The aortic valve is tricuspid. Aortic valve regurgitation is moderate. Mild aortic valve sclerosis without stenosis. 14. The pulmonic valve was grossly normal. Pulmonic valve regurgitation is not visualized. 15. Moderately elevated pulmonary artery systolic pressure. 16. The inferior vena cava is dilated in size with >50% respiratory variability, suggesting right atrial pressure of 8 mmHg.  Patient Profile  Joanna Ali is  a 69 y.o. female with history of anasarca, mild hepatic steatosis, moderate aortic insufficiency, pericardial effusion who was admitted on 12/14 for volume overload and tachycardia.  She subsequently suffered AKI now on hemodialysis.  She is also status post pericardial window for pericardial effusion.  Etiology for anasarca appears to be liver disease and likely poor p.o. intake and low protein.  AKI is related to contrast dye from CT PE study.  Assessment & Plan   1.  Anasarca/gross volume overload -Complicated course.  Has had extensive rheumatologic work-up which is negative.  Has had liver biopsy at [facility] which shows mild hepatic steatosis.  Negative EGD biopsy for amyloidosis at [facility].  Large pericardial effusion status post window.  Moderate aortic insufficiency.  Likely combination of liver disease as well as low protein.  No identifiable inflammatory disorder or GN syndrome to explain.   -volume status acceptable -PT/OT ordered as needs to improve activity  -NG tube placed and tolerate tube feeds to improve nutrtition   2.  Large pericardial effusion status post pericardial window -She underwent right and left heart catheterization and hemodynamics do  not support tamponade.  Due to worsening overall states she did undergo a pericardial window.  She had no improvement in kidney function or overall state since this.  This likely was secondary bystander in the overall process.  3.  Moderate aortic insufficiency -Stable on most recent echocardiogram.  4.  Protein deficiency -High-protein diet as well as Dobbhoff to help with protein deficiency.  5.  AKI on hemodialysis:  -Possibly related to volume overload and contrast load from CT PE study on admission.   -main issue is determining if she will progress to ESRD or recover from AKI -per nephrology   FEN -no IVF -daily BMP -dvt ppx: heparin -code: full   For questions or updates, please contact [place] Please consult [email or web address] for contact info under   Signed, [place] T. Audie Box, [place]  07/23/2019 4:11 PM

## 2019-07-24 LAB — RENAL FUNCTION PANEL
Albumin: 2.1 g/dL — ABNORMAL LOW (ref 3.5–5.0)
Anion gap: 12 (ref 5–15)
BUN: 56 mg/dL — ABNORMAL HIGH (ref 8–23)
CO2: 28 mmol/L (ref 22–32)
Calcium: 8.2 mg/dL — ABNORMAL LOW (ref 8.9–10.3)
Chloride: 90 mmol/L — ABNORMAL LOW (ref 98–111)
Creatinine, Ser: 3.17 mg/dL — ABNORMAL HIGH (ref 0.44–1.00)
GFR calc Af Amer: 16 mL/min — ABNORMAL LOW (ref >60)
GFR calc non Af Amer: 14 mL/min — ABNORMAL LOW (ref >60)
Glucose, Bld: 111 mg/dL — ABNORMAL HIGH (ref 70–99)
Phosphorus: 3.6 mg/dL (ref 2.5–4.6)
Potassium: 5.1 mmol/L (ref 3.5–5.1)
Sodium: 130 mmol/L — ABNORMAL LOW (ref 135–145)

## 2019-07-24 MED ORDER — MAGIC MOUTHWASH W/LIDOCAINE
5.0000 mL | Freq: Three times a day (TID) | ORAL | Status: DC | PRN
  Filled 2019-07-24: qty 5

## 2019-07-24 MED ORDER — BACITRACIN-NEOMYCIN-POLYMYXIN OINTMENT TUBE
TOPICAL_OINTMENT | Freq: Two times a day (BID) | CUTANEOUS | Status: DC
  Administered 2019-07-24 – 2019-08-21 (×24): 1 via TOPICAL
  Filled 2019-07-24: qty 14
  Filled 2019-07-24 (×3): qty 1
  Filled 2019-07-24: qty 14
  Filled 2019-07-24 (×3): qty 1

## 2019-07-24 MED ORDER — PROMETHAZINE HCL 25 MG/ML IJ SOLN
12.5000 mg | Freq: Four times a day (QID) | INTRAMUSCULAR | Status: DC | PRN
  Administered 2019-07-27 – 2019-08-01 (×8): 12.5 mg via INTRAVENOUS
  Filled 2019-07-24 (×8): qty 1

## 2019-07-24 NOTE — Progress Notes (Signed)
Rehab Admissions Coordinator Note:  Per therapy recommendations, patient was screened by Michel Santee for appropriateness for an Inpatient Acute Rehab Consult.  At this time, we are recommending Inpatient Rehab consult. Please place a consult order if pt would like to be considered.    Michel Santee 07/24/2019, 1:08 PM  I can be reached at [phone].

## 2019-07-24 NOTE — Progress Notes (Signed)
[facility] Progress Note    Assessment/ Plan:   Pt is a 69 y.o. yo female  with history of HTN, former smoker, hepatic steatosis with low albumin, chronic pericardial effusion, moderate AR admitted on 12/17 for progressive dyspnea, anasarca and fatigue, seen as a consultationfor AKIand fluid overload.  #Acute kidney injury,nonoliguric (crt normal in Sept, was 1.43 on 07/03/19) : Likely hemodynamically mediated concomitant with use of IV contrast- U/A not remarkable.  Autoimmune work-up unremarkable in the past. No improvement despite Foley catheter.  Started HD 07/18/19 with IR placed [facility] for progressive uremia.  HD 12/31--> MS improved, HD #2 1/3.  Plan for next HD tomorrow unless recovery suspected on labs and UOP overnight .   #Hyponatremia, hypervolemic: improving with HD, CTM  #Anasarca presumably due to liver disease which was diagnosed at [facility]. She has a history of liver steatosis.No evidence of GN/nephrotic syndrome. 3rd spacing also. Remains on diuretics for now (torsemide 40 daily)   #Moderate to large pericardial effusion:  s/p pericardial window on 12/23- TCTS following, stable  #Hypertension: Blood pressure acceptable. On low dose Coreg. . . #Anemia: s/p transfusion on 12/22.  gave feraheme and one dose of ESA on 12/23- stable. Check labs tomorrow.  # Constipation: resolved  #Hyperphosphatemia:  Cont to monitor for now, may need binder.    Subjective:    S/p HD yesterday.  UOP remains oliguric at 158m yesterday.  UF 2.1L with HD.  Feels tired but no specific complaints.    Objective:   BP 100/65 (BP Location: Left Arm)   Pulse 76   Temp 98.2 F (36.8 C) (Oral)   Resp 18   Ht 5' 6"  (1.676 m)   Wt 51.9 kg   SpO2 100%   BMI 18.47 kg/m   Intake/Output Summary (Last 24 hours) at 07/24/2019 0803 Last data filed at 07/24/2019 0400 Gross per 24 hour  Intake 900 ml  Output 2288 ml  Net -1388 ml   Weight change: 1.9 kg  Physical  Exam: Gen: cachectic, fatigued CVS: RRR, well-healing scars Resp: normal WOB Abd: thin, mildly painful to palpation Ext: significant flank edema ACCESS: L TDC   Imaging: DG CHEST PORT 1 VIEW  Result Date: 07/22/2019 CLINICAL DATA:  SOB, palpitations. EXAM: PORTABLE CHEST 1 VIEW COMPARISON:  Chest radiograph 07/20/2019 FINDINGS: Stable support apparatus. Unchanged cardiomediastinal contours with enlarged heart size. Persistent heterogeneous opacities at the right lung base and consolidation at the left lung base. Probable small bilateral effusions. No evidence of pneumothorax. IMPRESSION: Stable chest with bibasilar opacities and probable small pleural effusions. Electronically Signed   By: NAudie PintoM.D.   On: 07/22/2019 13:13    Labs: BMET Recent Labs  Lab 07/18/19 0500 07/19/19 0425 07/20/19 0515 07/21/19 0429 07/22/19 0347 07/23/19 0350 07/24/19 0408  NA 124* 132* 129* 134* 129* 127* 130*  K 4.3 3.8 4.0 3.7 4.5 6.1* 5.1  CL 84* 94* 90* 96* 91* 90* 90*  CO2 22 24 25 27 23 26 28   GLUCOSE 99 92 139* 113* 119* 112* 111*  BUN 128* 81* 86* 50* 64* 87* 56*  CREATININE 5.10* 4.04* 4.55* 2.95* 3.68* 4.45* 3.17*  CALCIUM 8.7* 8.5* 8.5* 8.1* 8.1* 8.3* 8.2*  PHOS 7.5* 5.0* 5.5* 3.0 3.5 3.8 3.6   CBC Recent Labs  Lab 07/20/19 0515  WBC 9.5  HGB 9.5*  HCT 30.7*  MCV 93.6  PLT 263    Medications:    . carvedilol  3.125 mg Oral BID WC  .  Chlorhexidine Gluconate Cloth  6 each Topical Daily  . Chlorhexidine Gluconate Cloth  6 each Topical Q0600  . collagenase   Topical BID  . darbepoetin (ARANESP) injection - NON-DIALYSIS  100 mcg Subcutaneous Q Fri-1800  . diphenhydrAMINE  25 mg Oral Once  . feeding supplement (ENSURE ENLIVE)  237 mL Oral TID BM  . gabapentin  100 mg Oral TID  . Gerhardt's butt cream   Topical TID  . heparin  5,000 Units Subcutaneous Q8H  . mouth rinse  15 mL Mouth Rinse BID  . prochlorperazine  5 mg Oral Once  . sertraline  25 mg Oral BID  .  sodium chloride flush  3 mL Intravenous Q12H  . sodium chloride flush  3 mL Intravenous Q12H  . sodium zirconium cyclosilicate  10 g Oral Once  . torsemide  40 mg Oral Daily      Madelon Lips MD [facility] pgr [phone] 07/24/2019, 8:03 AM

## 2019-07-24 NOTE — Progress Notes (Signed)
Progress Note  [name] Date of Encounter: 07/24/2019  Primary Cardiologist: Evalina Field, MD   Subjective   Feeling tired and weak.  Complaining of nausea.   Inpatient Medications    Scheduled Meds: . carvedilol  3.125 mg Oral BID WC  . Chlorhexidine Gluconate Cloth  6 each Topical Daily  . Chlorhexidine Gluconate Cloth  6 each Topical Q0600  . collagenase   Topical BID  . darbepoetin (ARANESP) injection - NON-DIALYSIS  100 mcg Subcutaneous Q Fri-1800  . diphenhydrAMINE  25 mg Oral Once  . feeding supplement (ENSURE ENLIVE)  237 mL Oral TID BM  . gabapentin  100 mg Oral TID  . Gerhardt's butt cream   Topical TID  . heparin  5,000 Units Subcutaneous Q8H  . mouth rinse  15 mL Mouth Rinse BID  . prochlorperazine  5 mg Oral Once  . sertraline  25 mg Oral BID  . sodium chloride flush  3 mL Intravenous Q12H  . sodium chloride flush  3 mL Intravenous Q12H  . sodium zirconium cyclosilicate  10 g Oral Once  . torsemide  40 mg Oral Daily   Continuous Infusions: . sodium chloride 500 mL (07/06/19 1043)  . sodium chloride    . sodium chloride    . sodium chloride    . feeding supplement (VITAL 1.5 CAL) 50 mL/hr at 07/23/19 1600   PRN Meds: sodium chloride, sodium chloride, sodium chloride, sodium chloride, acetaminophen, alteplase, alum & mag hydroxide-simeth, bisacodyl, bisacodyl, camphor-menthol, heparin, hydrOXYzine, lidocaine (PF), lidocaine-prilocaine, morphine injection, ondansetron (ZOFRAN) IV, oxyCODONE, pentafluoroprop-tetrafluoroeth, polyethylene glycol, polyvinyl alcohol, senna-docusate, sodium chloride flush, traMADol, witch hazel-glycerin   Vital Signs    Vitals:   07/23/19 1923 07/23/19 2333 07/24/19 0358 07/24/19 0731  BP: 112/74 117/72 101/68 100/65  Pulse: 79 82 81 76  Resp: 20 (!) 21 18 18   Temp: 97.9 F (36.6 C) 98.3 F (36.8 C) 98.5 F (36.9 C) 98.2 F (36.8 C)  TempSrc: Oral Oral Oral Oral  SpO2: 100% 98% 94% 100%  Weight:    51.9 kg   Height:        Intake/Output Summary (Last 24 hours) at 07/24/2019 0827 Last data filed at 07/24/2019 0400 Gross per 24 hour  Intake 900 ml  Output 2288 ml  Net -1388 ml   Last 3 Weights 07/24/2019 07/23/2019 07/23/2019  Weight (lbs) 114 lb 6.7 oz 113 lb 5.1 oz 118 lb 9.7 oz  Weight (kg) 51.9 kg 51.4 kg 53.8 kg      Telemetry    Sinus rhythm.  No events - Personally Reviewed  ECG    n/a - Personally Reviewed  Physical Exam   VS:  BP 100/65 (BP Location: Left Arm)   Pulse 76   Temp 98.2 F (36.8 C) (Oral)   Resp 18   Ht 5' 6"  (1.676 m)   Wt 51.9 kg   SpO2 100%   BMI 18.47 kg/m  , BMI Body mass index is 18.47 kg/m. GENERAL:  Frail, chronically ill-appearing woman in no acute distress.  Weak. HEENT: Pupils equal round and reactive, fundi not visualized, oral mucosa unremarkable NECK:  + jugular venous distention to below ear sitting upright, waveform within normal limits, carotid upstroke brisk and symmetric, no bruits LUNGS:  Crackles at right base HEART:  RRR.  PMI not displaced or sustained,S1 and S2 within normal limits, no S3, no S4, no clicks, no rubs, no murmurs ABD:  Flat, positive bowel sounds normal in frequency in  pitch, no bruits, no rebound, no guarding, no midline pulsatile mass, no hepatomegaly, no splenomegaly EXT:  2 plus pulses throughout, Trace LE edema, no cyanosis no clubbing SKIN: Scleroderma-like appearance NEURO:  Cranial nerves II through XII grossly intact, motor grossly intact throughout PSYCH:  Cognitively intact, oriented to person place and time   Labs    High Sensitivity Troponin:   Recent Labs  Lab 07/03/19 1739 07/03/19 1930  TROPONINIHS 149* 163*      Chemistry Recent Labs  Lab 07/22/19 0347 07/23/19 0350 07/24/19 0408  NA 129* 127* 130*  K 4.5 6.1* 5.1  CL 91* 90* 90*  CO2 23 26 28   GLUCOSE 119* 112* 111*  BUN 64* 87* 56*  CREATININE 3.68* 4.45* 3.17*  CALCIUM 8.1* 8.3* 8.2*  ALBUMIN 2.1* 2.1* 2.1*  GFRNONAA 12*  9* 14*  GFRAA 14* 11* 16*  ANIONGAP 15 11 12      Hematology Recent Labs  Lab 07/20/19 0515  WBC 9.5  RBC 3.28*  HGB 9.5*  HCT 30.7*  MCV 93.6  MCH 29.0  MCHC 30.9  RDW 19.0*  PLT 263    BNPNo results for input(s): BNP, PROBNP in the last 168 hours.   DDimer No results for input(s): DDIMER in the last 168 hours.   Radiology    DG CHEST PORT 1 VIEW  Result Date: 07/22/2019 CLINICAL DATA:  SOB, palpitations. EXAM: PORTABLE CHEST 1 VIEW COMPARISON:  Chest radiograph 07/20/2019 FINDINGS: Stable support apparatus. Unchanged cardiomediastinal contours with enlarged heart size. Persistent heterogeneous opacities at the right lung base and consolidation at the left lung base. Probable small bilateral effusions. No evidence of pneumothorax. IMPRESSION: Stable chest with bibasilar opacities and probable small pleural effusions. Electronically Signed   By: Audie Pinto M.D.   On: 07/22/2019 13:13    Cardiac Studies   Echo 07/17/19: IMPRESSIONS   1. Left ventricular ejection fraction, by visual estimation, is 50 to 55%. The left ventricle has low normal function. There is mildly increased left ventricular hypertrophy.  2. Moderate hypokinesis of the left ventricular, entire septal wall.  3. The left ventricle demonstrates regional wall motion abnormalities.  4. Global right ventricle has normal systolic function.The right ventricular size is normal. No increase in right ventricular wall thickness.  5. Left atrial size was normal.  6. Right atrial size was mildly dilated.  7. Moderate pleural effusion in the left lateral region.  8. The pericardial effusion is circumferential.  9. Trivial pericardial effusion is present. 10. The mitral valve is abnormal. Mild mitral valve regurgitation. 11. The tricuspid valve is grossly normal. Tricuspid valve regurgitation moderate. 12. Aortic valve regurgitation is moderate. 13. The aortic valve is tricuspid. Aortic valve regurgitation is  moderate. Mild aortic valve sclerosis without stenosis. 14. The pulmonic valve was grossly normal. Pulmonic valve regurgitation is not visualized. 15. Moderately elevated pulmonary artery systolic pressure. 16. The inferior vena cava is dilated in size with >50% respiratory variability, suggesting right atrial pressure of 8 mmHg.  RHC/LHC 07/06/19: Findings:  RA = 7  RV = 42/7 PA = 48/17 (28) PCW = 16 LV = 122/8 Fick cardiac output/index = 5.0/3.2 PVR = 2.4 WU FA sat = 98% PA sat = 62%, 62%  Prominent y-descents in RA tracing but no square root sign in ventricular tracings and no RV/LV interdeprendence  After 350 cc NS bolus  RA = 9 RV = 46/9 PA = 48/24 (33) PCW = 23 LV = 125/15  Prominent y-descents in RA tracing but  no square root sign in ventricular tracings and no RV/LV interdependence  Assessment: 1. Moderate pericardial effusion 2. Mild PAH 3. Normal filling pressures with no evidence of tamponade or constrictive physiology before/after fluid loading  Patient Profile     69 y.o. female with moderate AR, anasarca, mild hepatic steatosis admitted 12/14 with volume overload and tachycardia.  Hospitalization has been complicated by AKI now requiring HD and pericardial effusion s/p pericardial windown.  Assessment & Plan    # Anasarca: Volume status improving.  Thought to be due to liver disease and poor oral intake with low protein.  She had an extensie rheumatologic work up that was negative.  Liver biopsy at [facility] showed mild hepatic statosis.  Negative EGD biopsy for amyloidosis.  She is now getting tube feeds.  Appetite is poor and albumin is not improving.  She has nausea and emesis.  Will add phenergan as zofran and compazine are not helping.  Volume removal with HD as tolerated.  # Large pericardial effusion: S/p pericardial window.  No tamponade on LHC/RHC.  However, she underwent pericardial window due to worsening clinical course.  No renal improvement  after the procedure.    # Moderate AR: Diuresis with HD as above.    # Hypertension: BP stable on carvedilol.  # AKI: Thought to be related to hypotension and IV contrast.  Autoimmune work up has been negative in the past.  No renal recovery yet.  Going for HD tentatively on 1/5.  # Protein deficiency:  High protein diet and tube feeds.   For questions or updates, please contact [place] Please consult [email or web address] for contact info under        Signed, Skeet Latch, MD  07/24/2019, 8:27 AM

## 2019-07-24 NOTE — Evaluation (Signed)
Physical Therapy Evaluation Patient Details Name: Joanna Ali MRN: [id number] DOB: Dec 30, 1950 Today's Date: 07/24/2019   History of Present Illness  Pt is a 69 year old woman admitted 07/03/19 with anasarca, fatigue and tachycardia. Hospital course complicated by AKI with pt now on HD and pericardial effusion s/p pericardial window. PMH: HTN, former smoker and alcohol abuse, mild hepatic steatosis, chronic pleural effusion, aortic valve regurgitation  Clinical Impression  Patient presents with generalized weakness, deconditioning, impaired balance and decreased mobility s/p above. Pt reports being Mod I using SPC for ambulation PTA and needing minimal assist with ADLs from spouse. Today, pt requires Mod-Max A of 2 for bed mobility, standing and transfers with increased time, effort and cueing for sequencing. Reports generalized pain and nausea with movement first thing in the morning. Has supportive spouse. Would benefit from CIR to maximize independence and mobility prior to return home. Will follow acutely.    Follow Up Recommendations CIR;Supervision for mobility/OOB;Supervision/Assistance - 24 hour    Equipment Recommendations  Rolling walker with 5" wheels    Recommendations for Other Services Rehab consult     Precautions / Restrictions Precautions Precautions: Fall Precaution Comments: cortrak, nausea with movement, pain Restrictions Weight Bearing Restrictions: No      Mobility  Bed Mobility Overal bed mobility: Needs Assistance Bed Mobility: Supine to Sit     Supine to sit: +2 for physical assistance;Mod assist     General bed mobility comments: pt needing assist of pad to roll toward rail, assist for L LE over EOB and to raise trunk, increased time and verbal cues for sequencing   Transfers Overall transfer level: Needs assistance Equipment used: Rolling walker (2 wheeled);2 person hand held assist Transfers: Sit to/from Stand;Stand Pivot Transfers Sit to Stand: +2  physical assistance;Max assist;From elevated surface Stand pivot transfers: +2 physical assistance;Max assist       General transfer comment: use of pad under hips and gait belt to rise and steady, assist to place hands on walker correctly to facilitate upright posture, returned to sitting when it appeared evident that pt could not take steps, stood and transferred to chair second attempt with assist to advance R LE toward chair and for weight shifting.  Ambulation/Gait                Stairs            Wheelchair Mobility    Modified Rankin (Stroke Patients Only)       Balance Overall balance assessment: Needs assistance Sitting-balance support: Feet supported;Bilateral upper extremity supported Sitting balance-Leahy Scale: Fair Sitting balance - Comments: worked on upright posture, activating scapular muscles for better posture/upright   Standing balance support: During functional activity;Bilateral upper extremity supported Standing balance-Leahy Scale: Zero Standing balance comment: Max A of 2 for standing                             Pertinent Vitals/Pain Pain Assessment: Faces Faces Pain Scale: Hurts little more Pain Location: generalized Pain Descriptors / Indicators: Grimacing;Guarding;Moaning Pain Intervention(s): Repositioned;Monitored during session    Martin expects to be discharged to:: Private residence Living Arrangements: Spouse/significant other Available Help at Discharge: Family;Available 24 hours/day Type of Home: House Home Access: Stairs to enter Entrance Stairs-Rails: None Entrance Stairs-Number of Steps: 3 Home Layout: Two level;Able to live on main level with bedroom/bathroom Home Equipment: Kasandra Knudsen - single point;Other (comment);Toilet riser;Shower seat;Hand held shower head;Bedside commode Additional Comments: cushion for  toilet    Prior Function Level of Independence: Independent with assistive  device(s)         Comments: Uses SPC for ambulation. Sits to shower. Uses BSC for night time. Gets help with shoes/socks. Spouse does IADLs.     Hand Dominance   Dominant Hand: Right    Extremity/Trunk Assessment   Upper Extremity Assessment Upper Extremity Assessment: Defer to OT evaluation    Lower Extremity Assessment Lower Extremity Assessment: Generalized weakness(Limited knee extension bilaterally, Rt>left.)    Cervical / Trunk Assessment Cervical / Trunk Assessment: Other exceptions Cervical / Trunk Exceptions: weakness with flexed posture, report of back pain  Communication   Communication: Expressive difficulties  Cognition Arousal/Alertness: Awake/alert Behavior During Therapy: Anxious Overall Cognitive Status: Impaired/Different from baseline Area of Impairment: Following commands;Problem solving                       Following Commands: Follows one step commands with increased time     Problem Solving: Slow processing;Decreased initiation;Difficulty sequencing;Requires verbal cues;Requires tactile cues General Comments: pt anxious about becoming nauseous, moves slowly, like to eat ice throughout session espeically before moving.      General Comments General comments (skin integrity, edema, etc.): Spouse present during session. VSS throughout.    Exercises     Assessment/Plan    PT Assessment Patient needs continued PT services  PT Problem List Decreased strength;Decreased mobility;Decreased range of motion;Decreased activity tolerance;Decreased cognition;Pain;Decreased balance;Decreased knowledge of use of DME       PT Treatment Interventions Therapeutic activities;Gait training;Therapeutic exercise;Patient/family education;Wheelchair mobility training;Balance training;Functional mobility training;Neuromuscular re-education;DME instruction;Cognitive remediation;Manual techniques    PT Goals (Current goals can be found in the Care Plan  section)  Acute Rehab PT Goals Patient Stated Goal: to get stronger and go home PT Goal Formulation: With patient/family Time For Goal Achievement: 08/07/19 Potential to Achieve Goals: Good    Frequency Min 3X/week   Barriers to discharge Inaccessible home environment stairs to enter home    Co-evaluation PT/OT/SLP Co-Evaluation/Treatment: Yes Reason for Co-Treatment: Complexity of the patient's impairments (multi-system involvement);For patient/therapist safety PT goals addressed during session: Mobility/safety with mobility OT goals addressed during session: Strengthening/ROM;Proper use of Adaptive equipment and DME       AM-PAC PT "6 Clicks" Mobility  Outcome Measure Help needed turning from your back to your side while in a flat bed without using bedrails?: A Lot Help needed moving from lying on your back to sitting on the side of a flat bed without using bedrails?: A Lot Help needed moving to and from a bed to a chair (including a wheelchair)?: Total Help needed standing up from a chair using your arms (e.g., wheelchair or bedside chair)?: Total Help needed to walk in hospital room?: Total Help needed climbing 3-5 steps with a railing? : Total 6 Click Score: 8    End of Session Equipment Utilized During Treatment: Gait belt Activity Tolerance: Patient tolerated treatment well Patient left: in chair;with call bell/phone within reach;with family/visitor present Nurse Communication: Mobility status PT Visit Diagnosis: Unsteadiness on feet (R26.81);Muscle weakness (generalized) (M62.81);Difficulty in walking, not elsewhere classified (R26.2);Pain Pain - part of body: (generalized)    Time: 2878-6767 PT Time Calculation (min) (ACUTE ONLY): 30 min   Charges:   PT Evaluation $PT Eval Moderate Complexity: 1 Mod          Marisa Severin, PT, DPT Acute Rehabilitation Services Pager [phone] Office [phone]      Marguarite Arbour A Naiyana Barbian 07/24/2019, 12:09  PM

## 2019-07-24 NOTE — Evaluation (Signed)
Occupational Therapy Evaluation Patient Details Name: Joanna Ali MRN: [id number] DOB: 10-26-1950 Today's Date: 07/24/2019    History of Present Illness Pt is a 69 year old woman admitted 07/03/19 with anasarca, fatigue and tachycardia. Hospital course complicated by AKI with pt now on HD and pericardial effusion s/p pericardial window. PMH: HTN, former smoker and alcohol abuse, mild hepatic steatosis, chronic pleural effusion, aortic valve regurgitation   Clinical Impression   Pt reports ambulating with a cane and needing assistance for LB dressing and all IADL prior to admission. Pt was active and independent as recently as July per husband. Pt presents with significant weakness, generalized pain and decreased activity tolerance. She is anxious about becoming nauseous during mobility and requires an extended period of time for all activity. Pt currently needs 2 person assist for all mobility and is not able to ambulate. She requires min to total assist for ADL. Pt will need intensive rehab prior to return home with her supportive husband.    Follow Up Recommendations  CIR    Equipment Recommendations  Other (comment)(defer to next venue)    Recommendations for Other Services Rehab consult     Precautions / Restrictions Precautions Precautions: Fall Precaution Comments: cortrak, nausea with movement, pain      Mobility Bed Mobility Overal bed mobility: Needs Assistance Bed Mobility: Supine to Sit     Supine to sit: +2 for physical assistance;Mod assist     General bed mobility comments: pt needing assist of pad to roll toward rail, assist for L LE over EOB and to raise trunk, increased time and verbal cues for sequencing   Transfers Overall transfer level: Needs assistance Equipment used: Rolling walker (2 wheeled);2 person hand held assist Transfers: Sit to/from Stand;Stand Pivot Transfers Sit to Stand: +2 physical assistance;Max assist;From elevated surface Stand pivot  transfers: +2 physical assistance;Max assist       General transfer comment: use of pad under hips and gait belt to rise and steady, assist to place hands on walker correctly to facilitate upright posture, returned to sitting when it appeared evident that pt could not take steps, stood and transferred to chair second attempt with assist to advance R LE toward chair    Balance Overall balance assessment: Needs assistance Sitting-balance support: Bilateral upper extremity supported Sitting balance-Leahy Scale: Fair     Standing balance support: Bilateral upper extremity supported Standing balance-Leahy Scale: Zero                             ADL either performed or assessed with clinical judgement   ADL Overall ADL's : Needs assistance/impaired Eating/Feeding: Set up;Bed level;Sitting Eating/Feeding Details (indicate cue type and reason): ice chips with spoon Grooming: Brushing hair;Sitting;Total assistance   Upper Body Bathing: Maximal assistance;Sitting   Lower Body Bathing: Total assistance;+2 for physical assistance;Sit to/from stand   Upper Body Dressing : Maximal assistance;Sitting   Lower Body Dressing: Total assistance;+2 for physical assistance;Sit to/from stand   Toilet Transfer: +2 for physical assistance;Maximal assistance;Stand-pivot   Toileting- Clothing Manipulation and Hygiene: +2 for safety/equipment;Total assistance;Sit to/from stand               Vision Baseline Vision/History: Wears glasses Wears Glasses: At all times Patient Visual Report: No change from baseline       Perception     Praxis      Pertinent Vitals/Pain Pain Assessment: Faces Faces Pain Scale: Hurts little more Pain Location: generalized Pain Descriptors /  Indicators: Grimacing;Guarding;Moaning Pain Intervention(s): Monitored during session;Repositioned     Hand Dominance Right   Extremity/Trunk Assessment Upper Extremity Assessment Upper Extremity Assessment:  Generalized weakness(arthritic changes in hands)   Lower Extremity Assessment Lower Extremity Assessment: Defer to PT evaluation   Cervical / Trunk Assessment Cervical / Trunk Assessment: Other exceptions Cervical / Trunk Exceptions: weakness with flexed posture, report of back pain   Communication Communication Communication: Expressive difficulties(low volume)   Cognition Arousal/Alertness: Awake/alert Behavior During Therapy: Anxious Overall Cognitive Status: Impaired/Different from baseline Area of Impairment: Following commands;Problem solving                       Following Commands: Follows one step commands with increased time     Problem Solving: Slow processing;Decreased initiation;Difficulty sequencing;Requires verbal cues;Requires tactile cues General Comments: pt anxious about becoming nauseous, moves slowly, like to eat ice throughout session   General Comments       Exercises     Shoulder Instructions      Home Living Family/patient expects to be discharged to:: Private residence Living Arrangements: Spouse/significant other Available Help at Discharge: Family;Available 24 hours/day Type of Home: House Home Access: Stairs to enter [employer] of Steps: 3 Entrance Stairs-Rails: None Home Layout: Two level;Able to live on main level with bedroom/bathroom     Bathroom Shower/Tub: Occupational psychologist: Standard     Home Equipment: Cane - single point;Other (comment);Toilet riser;Shower seat;Hand held shower head;Bedside commode   Additional Comments: cushion for toilet      Prior Functioning/Environment Level of Independence: Independent with assistive device(s)        Comments: Uses SPC for ambulation. Sits to shower. Uses BSC for night time. Gets help with shoes/socks. Spouse does IADLs.        OT Problem List: Decreased strength;Decreased activity tolerance;Impaired balance (sitting and/or standing);Decreased  cognition;Decreased safety awareness;Decreased knowledge of use of DME or AE;Pain      OT Treatment/Interventions: Self-care/ADL training;DME and/or AE instruction;Therapeutic activities;Cognitive remediation/compensation;Patient/family education;Balance training    OT Goals(Current goals can be found in the care plan section) Acute Rehab OT Goals Patient Stated Goal: to get stronger and go home OT Goal Formulation: With patient Time For Goal Achievement: 08/07/19 Potential to Achieve Goals: Good ADL Goals Pt Will Perform Grooming: with set-up;sitting(at sink, 3 activities) Pt Will Perform Upper Body Bathing: with min assist;sitting Pt Will Perform Upper Body Dressing: with set-up;with supervision;sitting Pt Will Transfer to Toilet: with mod assist;stand pivot transfer;bedside commode Pt Will Perform Toileting - Clothing Manipulation and hygiene: with min assist;sitting/lateral leans Additional ADL Goal #1: Pt will perform bed mobility with min assist in preparation for ADL.  OT Frequency: Min 2X/week   Barriers to D/C:            Co-evaluation PT/OT/SLP Co-Evaluation/Treatment: Yes Reason for Co-Treatment: Complexity of the patient's impairments (multi-system involvement);For patient/therapist safety   OT goals addressed during session: Strengthening/ROM;Proper use of Adaptive equipment and DME      AM-PAC OT "6 Clicks" Daily Activity     Outcome Measure Help from another person eating meals?: A Little Help from another person taking care of personal grooming?: A Lot Help from another person toileting, which includes using toliet, bedpan, or urinal?: Total Help from another person bathing (including washing, rinsing, drying)?: A Lot Help from another person to put on and taking off regular upper body clothing?: A Lot Help from another person to put on and taking off regular lower body  clothing?: Total 6 Click Score: 11   End of Session Equipment Utilized During Treatment:  Gait belt;Rolling walker Nurse Communication: Mobility status  Activity Tolerance: Patient tolerated treatment well(VSS) Patient left: in chair;with call bell/phone within reach;with family/visitor present  OT Visit Diagnosis: Unsteadiness on feet (R26.81);Pain;Muscle weakness (generalized) (M62.81);Other symptoms and signs involving cognitive function                Time: 5885-0277 OT Time Calculation (min): 33 min Charges:  OT General Charges $OT Visit: 1 Visit OT Evaluation $OT Eval Moderate Complexity: 1 Mod  [name], OTR/L Acute Rehabilitation Services Pager: [phone] Office: [phone]  [name] 07/24/2019, 11:27 AM

## 2019-07-24 NOTE — Progress Notes (Signed)
Nutrition Follow-up  RD working remotely.  DOCUMENTATION CODES:   Severe malnutrition in context of chronic illness, Underweight  INTERVENTION:   RD will monitor bilateral nares for pressure injury development. If needed, can replace or adjust Cortrak on Friday, 07/27/18.  Continue tube feeding via Cortrak: - Vital 1.5 @ 50 ml/hr (1200 ml/day)  Tube feeding regimen provides 1800 kcal, 81 grams of protein, and 917 ml of H2O.  - Continue Ensure Enlive po TID, each supplement provides 350 kcal and 20 grams of protein  NUTRITION DIAGNOSIS:   Severe Malnutrition related to chronic illness (pericardial effusion w/ volume overload) as evidenced by moderate fat depletion, severe muscle depletion, percent weight loss.  Ongoing, being addressed via TF  GOAL:   Patient will meet greater than or equal to 90% of their needs  Met via TF  MONITOR:   PO intake, Supplement acceptance, Labs, TF tolerance, Weight trends, I & O's  REASON FOR ASSESSMENT:   Malnutrition Screening Tool, Consult Assessment of nutrition requirement/status  ASSESSMENT:   Patient with PMH significant for chronic pericardial effusion, moderate AVR, HTN, hepatic steatosis, and peripheral neuropathy. Presents this admission with anasarca and bilateral pleural effusions.  12/15 - s/p bilateral thoracentesis with 700 ml fluid removed 12/17 - s/p cardiac cath 12/23 - s/p VATS, pericardial window 12/30 - Cortrak placed, tip in stomach  Per Nephrology, plan for next HD tomorrow unless recovery suspected on labs and UOP overnight.  Pt refusing most Ensure Enlive supplements per [facility] documentation.  Per notes, pt experiencing nausea this AM.  Post-HD weight on 1/03 was 51.4 kg. Weight this AM is consistent with admit weight. Pt's weight has fluctuated between 49-57 kg since admit. Per RN edema assessment, pt with moderate pitting edema to BLE and perineal area.  Spoke with pt via phone call to room. Pt reporting  intense throat pain that she compares to strep throat. Pt believes that she is having this pain because of the Cortrak tube. Pt reports soreness with swallowing both liquids and solids. Discussed with MD who ordered Magic Mouthwash.  Spoke with RN who reports cleaning pt's bridle and Cortrak thoroughly this AM. Plan to monitor for skin breakdown around Cortrak and adjust or replace on Friday if needed.  Meal Completion: 0-75% x last 8 meals  Medications reviewed and include: Ensure Enlive TID, torsemide  Labs reviewed: sodium 130, chloride 90  UOP: 100 ml x 24 hours HD net UF on 1/03: 2188 ml  Diet Order:   Diet Order            Diet regular Room service appropriate? Yes; Fluid consistency: Thin; Fluid restriction: 1500 mL Fluid  Diet effective now              EDUCATION NEEDS:   Education needs have been addressed  Skin:  Skin Assessment: Skin Integrity Issues: Skin Integrity Issues: Stage II: sacrum, bilateral nares Incisions: chest, left neck Other: cracking/dryness to BLE  Last BM:  07/22/18  Height:   Ht Readings from Last 1 Encounters:  07/04/19 5' 6"  (1.676 m)    Weight:   Wt Readings from Last 1 Encounters:  07/24/19 51.9 kg    Ideal Body Weight:  59.1 kg  BMI:  Body mass index is 18.47 kg/m.  Estimated Nutritional Needs:   Kcal:  1600-1800 kcal  Protein:  80-95 grams  Fluid:  >/= 1.6 L/day    Gaynell Face, MS, RD, LDN Inpatient Clinical Dietitian Pager: [phone] Weekend/After Hours: [phone]

## 2019-07-25 ENCOUNTER — Ambulatory Visit: Payer: Medicare Other | Admitting: Thoracic Surgery (Cardiothoracic Vascular Surgery)

## 2019-07-25 LAB — RENAL FUNCTION PANEL
Albumin: 2.2 g/dL — ABNORMAL LOW (ref 3.5–5.0)
Albumin: 2.2 g/dL — ABNORMAL LOW (ref 3.5–5.0)
Anion gap: 13 (ref 5–15)
Anion gap: 10 (ref 5–15)
BUN: 85 mg/dL — ABNORMAL HIGH (ref 8–23)
BUN: 32 mg/dL — ABNORMAL HIGH (ref 8–23)
CO2: 26 mmol/L (ref 22–32)
CO2: 28 mmol/L (ref 22–32)
Calcium: 8.6 mg/dL — ABNORMAL LOW (ref 8.9–10.3)
Calcium: 7.6 mg/dL — ABNORMAL LOW (ref 8.9–10.3)
Chloride: 92 mmol/L — ABNORMAL LOW (ref 98–111)
Chloride: 93 mmol/L — ABNORMAL LOW (ref 98–111)
Creatinine, Ser: 3.96 mg/dL — ABNORMAL HIGH (ref 0.44–1.00)
Creatinine, Ser: 1.81 mg/dL — ABNORMAL HIGH (ref 0.44–1.00)
GFR calc Af Amer: 13 mL/min — ABNORMAL LOW (ref >60)
GFR calc Af Amer: 32 mL/min — ABNORMAL LOW (ref >60)
GFR calc non Af Amer: 11 mL/min — ABNORMAL LOW (ref >60)
GFR calc non Af Amer: 28 mL/min — ABNORMAL LOW (ref >60)
Glucose, Bld: 115 mg/dL — ABNORMAL HIGH (ref 70–99)
Glucose, Bld: 120 mg/dL — ABNORMAL HIGH (ref 70–99)
Phosphorus: 3.9 mg/dL (ref 2.5–4.6)
Phosphorus: 1.8 mg/dL — ABNORMAL LOW (ref 2.5–4.6)
Potassium: 6.3 mmol/L (ref 3.5–5.1)
Potassium: 4.1 mmol/L (ref 3.5–5.1)
Sodium: 131 mmol/L — ABNORMAL LOW (ref 135–145)
Sodium: 131 mmol/L — ABNORMAL LOW (ref 135–145)

## 2019-07-25 LAB — CBC
HCT: 30.6 % — ABNORMAL LOW (ref 36.0–46.0)
Hemoglobin: 9 g/dL — ABNORMAL LOW (ref 12.0–15.0)
MCH: 28.6 pg (ref 26.0–34.0)
MCHC: 29.4 g/dL — ABNORMAL LOW (ref 30.0–36.0)
MCV: 97.1 fL (ref 80.0–100.0)
Platelets: 198 10*3/uL (ref 150–400)
RBC: 3.15 MIL/uL — ABNORMAL LOW (ref 3.87–5.11)
RDW: 19.6 % — ABNORMAL HIGH (ref 11.5–15.5)
WBC: 8.5 10*3/uL (ref 4.0–10.5)
nRBC: 0 % (ref 0.0–0.2)

## 2019-07-25 LAB — SEDIMENTATION RATE: Sed Rate: 25 mm/hr — ABNORMAL HIGH (ref 0–22)

## 2019-07-25 MED ORDER — MORPHINE SULFATE (PF) 2 MG/ML IV SOLN
INTRAVENOUS | Status: AC
  Administered 2019-07-25: 2 mg via INTRAVENOUS
  Filled 2019-07-25: qty 1

## 2019-07-25 MED ORDER — OXYCODONE HCL 5 MG PO TABS
ORAL_TABLET | ORAL | Status: AC
  Filled 2019-07-25: qty 2

## 2019-07-25 MED ORDER — MAGIC MOUTHWASH W/LIDOCAINE
5.0000 mL | Freq: Three times a day (TID) | ORAL | Status: DC
  Administered 2019-07-25 – 2019-07-30 (×7): 5 mL via ORAL
  Filled 2019-07-25 (×17): qty 5

## 2019-07-25 MED ORDER — HEPARIN SODIUM (PORCINE) 1000 UNIT/ML IJ SOLN
INTRAMUSCULAR | Status: AC
  Administered 2019-07-25: 3800 [IU] via INTRAVENOUS_CENTRAL
  Filled 2019-07-25: qty 4

## 2019-07-25 MED ORDER — NYSTATIN 100000 UNIT/ML MT SUSP
5.0000 mL | Freq: Four times a day (QID) | OROMUCOSAL | Status: DC
  Administered 2019-07-25 – 2019-08-23 (×70): 500000 [IU] via OROMUCOSAL
  Filled 2019-07-25 (×68): qty 5

## 2019-07-25 NOTE — Progress Notes (Addendum)
Progress Note  [name] Date of Encounter: 07/25/2019  Primary Cardiologist: Evalina Field, MD   Subjective   She continues to have a very sore throat that makes it nearly impossible for her to eat.  She has a hard time swallowing.  She also has nausea that is somewhat improving.  Inpatient Medications    Scheduled Meds: . carvedilol  3.125 mg Oral BID WC  . Chlorhexidine Gluconate Cloth  6 each Topical Daily  . Chlorhexidine Gluconate Cloth  6 each Topical Q0600  . collagenase   Topical BID  . darbepoetin (ARANESP) injection - NON-DIALYSIS  100 mcg Subcutaneous Q Fri-1800  . diphenhydrAMINE  25 mg Oral Once  . feeding supplement (ENSURE ENLIVE)  237 mL Oral TID BM  . gabapentin  100 mg Oral TID  . Gerhardt's butt cream   Topical TID  . heparin  5,000 Units Subcutaneous Q8H  . mouth rinse  15 mL Mouth Rinse BID  . neomycin-bacitracin-polymyxin   Topical BID  . oxyCODONE      . prochlorperazine  5 mg Oral Once  . sertraline  25 mg Oral BID  . sodium chloride flush  3 mL Intravenous Q12H  . sodium chloride flush  3 mL Intravenous Q12H  . sodium zirconium cyclosilicate  10 g Oral Once  . torsemide  40 mg Oral Daily   Continuous Infusions: . sodium chloride 500 mL (07/06/19 1043)  . sodium chloride    . sodium chloride    . sodium chloride    . feeding supplement (VITAL 1.5 CAL) 1,000 mL (07/24/19 1247)   PRN Meds: sodium chloride, sodium chloride, sodium chloride, sodium chloride, acetaminophen, alteplase, alum & mag hydroxide-simeth, bisacodyl, bisacodyl, camphor-menthol, heparin, hydrOXYzine, lidocaine (PF), lidocaine-prilocaine, magic mouthwash w/lidocaine, morphine injection, ondansetron (ZOFRAN) IV, oxyCODONE, pentafluoroprop-tetrafluoroeth, polyethylene glycol, polyvinyl alcohol, promethazine, senna-docusate, sodium chloride flush, traMADol, witch hazel-glycerin   Vital Signs    Vitals:   07/25/19 0830 07/25/19 0900 07/25/19 0930 07/25/19 1000    BP: (!) 92/53 98/64 105/77 104/66  Pulse: 82 81 83 81  Resp: (!) _0 Temp:      TempSrc:      SpO2:      Weight:      Height:        Intake/Output Summary (Last 24 hours) at 07/25/2019 1016 Last data filed at 07/25/2019 0625 Gross per 24 hour  Intake 1500 ml  Output 100 ml  Net 1400 ml   Last 3 Weights 07/25/2019 07/25/2019 07/24/2019  Weight (lbs) 113 lb 15.7 oz 115 lb 1.3 oz 114 lb 6.7 oz  Weight (kg) 51.7 kg 52.2 kg 51.9 kg      Telemetry    Sinus rhythm.  No events - Personally Reviewed  ECG    n/a - Personally Reviewed  Physical Exam   VS:  BP 104/66 (BP Location: Left Arm)   Pulse 81   Temp (!) 97.5 F (36.4 C) (Oral)   Resp 18   Ht _1  (1.676 m)   Wt 51.7 kg   SpO2 96%   BMI 18.40 kg/m  , BMI Body mass index is 18.4 kg/m. GENERAL:  Cachectic, chronically ill-appearing woman in no acute distress.  Weak. HEENT: Pupils equal round and reactive, fundi not visualized, oral mucosa dry.  Large tongue. NECK:  + jugular venous distention, waveform within normal limits, carotid upstroke brisk and symmetric, no bruits LUNGS:  Clear on anterior exam HEART:  RRR.  PMI  not displaced or sustained,S1 and S2 within normal limits, no S3, no S4, no clicks, no rubs, no murmurs ABD:  Flat, positive bowel sounds normal in frequency in pitch, no bruits.  +flank edema EXT:  2 plus pulses throughout, Trace LE edema, no cyanosis no clubbing SKIN: Scleroderma-like appearance NEURO:  Cranial nerves II through XII grossly intact, motor grossly intact throughout PSYCH:  Cognitively intact, oriented to person place and time   Labs    High Sensitivity Troponin:   Recent Labs  Lab 07/03/19 1739 07/03/19 1930  TROPONINIHS 149* 163*      Chemistry Recent Labs  Lab 07/23/19 0350 07/24/19 0408 07/25/19 0454  NA 127* 130* 131*  K 6.1* 5.1 6.3*  CL 90* 90* 92*  CO2 _0 GLUCOSE 112* 111* 115*  BUN 87* 56* 85*  CREATININE 4.45* 3.17* 3.96*  CALCIUM 8.3* 8.2* 8.6*   ALBUMIN 2.1* 2.1* 2.2*  GFRNONAA 9* 14* 11*  GFRAA 11* 16* 13*  ANIONGAP _1 Hematology Recent Labs  Lab 07/20/19 0515 07/25/19 0454  WBC 9.5 8.5  RBC 3.28* 3.15*  HGB 9.5* 9.0*  HCT 30.7* 30.6*  MCV 93.6 97.1  MCH 29.0 28.6  MCHC 30.9 29.4*  RDW 19.0* 19.6*  PLT 263 198    BNPNo results for input(s): BNP, PROBNP in the last 168 hours.   DDimer No results for input(s): DDIMER in the last 168 hours.   Radiology    No results found.  Cardiac Studies   Echo 07/17/19: IMPRESSIONS   1. Left ventricular ejection fraction, by visual estimation, is 50 to 55%. The left ventricle has low normal function. There is mildly increased left ventricular hypertrophy.  2. Moderate hypokinesis of the left ventricular, entire septal wall.  3. The left ventricle demonstrates regional wall motion abnormalities.  4. Global right ventricle has normal systolic function.The right ventricular size is normal. No increase in right ventricular wall thickness.  5. Left atrial size was normal.  6. Right atrial size was mildly dilated.  7. Moderate pleural effusion in the left lateral region.  8. The pericardial effusion is circumferential.  9. Trivial pericardial effusion is present. 10. The mitral valve is abnormal. Mild mitral valve regurgitation. 11. The tricuspid valve is grossly normal. Tricuspid valve regurgitation moderate. 12. Aortic valve regurgitation is moderate. 13. The aortic valve is tricuspid. Aortic valve regurgitation is moderate. Mild aortic valve sclerosis without stenosis. 14. The pulmonic valve was grossly normal. Pulmonic valve regurgitation is not visualized. 15. Moderately elevated pulmonary artery systolic pressure. 16. The inferior vena cava is dilated in size with >50% respiratory variability, suggesting right atrial pressure of 8 mmHg.  RHC/LHC 07/06/19: Findings:  RA = 7  RV = 42/7 PA = 48/17 (28) PCW = 16 LV = 122/8 Fick cardiac output/index =  5.0/3.2 PVR = 2.4 WU FA sat = 98% PA sat = 62%, 62%  Prominent y-descents in RA tracing but no square root sign in ventricular tracings and no RV/LV interdeprendence  After 350 cc NS bolus  RA = 9 RV = 46/9 PA = 48/24 (33) PCW = 23 LV = 125/15  Prominent y-descents in RA tracing but no square root sign in ventricular tracings and no RV/LV interdependence  Assessment: 1. Moderate pericardial effusion 2. Mild PAH 3. Normal filling pressures with no evidence of tamponade or constrictive physiology before/after fluid loading  Patient Profile     69 y.o. female with moderate AR, anasarca, mild hepatic  steatosis admitted 12/14 with volume overload and tachycardia.  Hospitalization has been complicated by AKI now requiring HD and pericardial effusion s/p pericardial windown.  Assessment & Plan    # Anasarca: Volume status improving.  Thought to be due to liver disease and poor oral intake with low protein.  She had an autoimmune work up that had elevated CRP with normal ESR.  She had negative RF, anti-Jo/Ro, ANCA, ANA, SPEP, and normal thyroid studies.  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  Liver biopsy at [facility] showed mild hepatic steatosis but no cirrhosis.  Negative EGD biopsy for amyloidosis.  Her hands and face have a scleroderma-like appearance.  Will check anti-scl and repeat esr.  She should have another rheumatologic evaluation once discharged.  She is now getting tube feeds with minimal improvement in albumin.  She has been unable to eat due to sore throat and nausea.  She isn't tolerating the tube well.  Magic mouthwash was ordered Prn but she wasn't getting it.  Will schedule.  Will also try Nystatin swish/swallow.  If this doesn't work, may need to discuss g-tube placement.  Volume removal with HD as tolerated.  # Large pericardial effusion: S/p pericardial window.  No tamponade on LHC/RHC.  However, she underwent pericardial window due to worsening clinical  course.  No renal improvement after the procedure.    # Moderate AR: Diuresis with HD as above.    # Hypertension: BP stable on carvedilol.  # AKI: Thought to be related to hypotension and IV contrast.  Autoimmune work up has been negative in the past.  No renal recovery.  Patient seen in HD today.  # Protein deficiency:  High protein diet and tube feeds.  # Dispo: Medically OK for CIR discharge presuming she can continue to get HD from there.  Will need to figure out how to make tube feeds more comfortable prior to discharge.   For questions or updates, please contact [place] Please consult [email or web address] for contact info under        Signed, Skeet Latch, MD  07/25/2019, 10:16 AM

## 2019-07-25 NOTE — Progress Notes (Signed)
[facility] Progress Note    Assessment/ Plan:   Pt is a 69 y.o. yo female  with history of HTN, former smoker, hepatic steatosis with low albumin, chronic pericardial effusion, moderate AR admitted on 12/17 for progressive dyspnea, anasarca and fatigue, seen as a consultationfor AKIand fluid overload.  #Acute kidney injury,nonoliguric (crt normal in Sept, was 1.43 on 07/03/19) : Likely hemodynamically mediated concomitant with use of IV contrast- U/A not remarkable.  Autoimmune work-up unremarkable in the past. No improvement despite Foley catheter.  Started HD 07/18/19 with IR placed [facility] for progressive uremia.  HD 12/31--> MS improved, HD #2 1/3.  HD today.  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   #Hyponatremia, hypervolemic: improved with HD, CTM  #Anasarca presumably due to liver disease which was diagnosed at [facility]. She has a history of liver steatosis.No evidence of GN/nephrotic syndrome. 3rd spacing also. Remains on diuretics for now (torsemide 40 daily)   #Moderate to large pericardial effusion:  s/p pericardial window on 12/23- TCTS following, stable  #Hypertension: Blood pressure acceptable. On low dose Coreg. . . #Anemia: s/p transfusion on 12/22.  gave feraheme and one dose of ESA on 12/23.  Hb 9.    # Constipation: resolved  #Hyperphosphatemia:  Improved with dialysis.  Phos in the 3s currently.     Subjective:    Seen on HD this AM.  No specific complaints but seems more fatigued and discouraged.    Objective:   BP 127/73 (BP Location: Left Arm)   Pulse 80   Temp (!) 97.5 F (36.4 C) (Oral)   Resp (!) 24   Ht 5' 6"  (1.676 m)   Wt 52.2 kg   SpO2 96%   BMI 18.57 kg/m   Intake/Output Summary (Last 24 hours) at 07/25/2019 8280 Last data filed at 07/25/2019 0349 Gross per 24 hour  Intake 1500 ml  Output 100 ml  Net 1400 ml   Weight change: -1.6  kg  Physical Exam: Gen: cachectic, fatigued CVS: RRR, well-healing scars Resp: normal WOB Abd: thin, mildly painful to palpation Ext: significant flank edema ACCESS: L TDC c/d/i  Imaging: No results found.  Labs: BMET Recent Labs  Lab 07/19/19 0425 07/20/19 0515 07/21/19 0429 07/22/19 0347 07/23/19 0350 07/24/19 0408 07/25/19 0454  NA 132* 129* 134* 129* 127* 130* 131*  K 3.8 4.0 3.7 4.5 6.1* 5.1 6.3*  CL 94* 90* 96* 91* 90* 90* 92*  CO2 24 25 27 23 26 28 26   GLUCOSE 92 139* 113* 119* 112* 111* 115*  BUN 81* 86* 50* 64* 87* 56* 85*  CREATININE 4.04* 4.55* 2.95* 3.68* 4.45* 3.17* 3.96*  CALCIUM 8.5* 8.5* 8.1* 8.1* 8.3* 8.2* 8.6*  PHOS 5.0* 5.5* 3.0 3.5 3.8 3.6 3.9   CBC Recent Labs  Lab 07/20/19 0515 07/25/19 0454  WBC 9.5 8.5  HGB 9.5* 9.0*  HCT 30.7* 30.6*  MCV 93.6 97.1  PLT 263 198    Medications:    . carvedilol  3.125 mg Oral BID WC  . Chlorhexidine Gluconate Cloth  6 each Topical Daily  . Chlorhexidine Gluconate Cloth  6 each Topical Q0600  . collagenase   Topical BID  . darbepoetin (ARANESP) injection - NON-DIALYSIS  100 mcg Subcutaneous Q Fri-1800  . diphenhydrAMINE  25 mg Oral Once  . feeding supplement (ENSURE ENLIVE)  237 mL Oral TID BM  . gabapentin  100 mg Oral TID  .  Gerhardt's butt cream   Topical TID  . heparin  5,000 Units Subcutaneous Q8H  . mouth rinse  15 mL Mouth Rinse BID  . neomycin-bacitracin-polymyxin   Topical BID  . prochlorperazine  5 mg Oral Once  . sertraline  25 mg Oral BID  . sodium chloride flush  3 mL Intravenous Q12H  . sodium chloride flush  3 mL Intravenous Q12H  . sodium zirconium cyclosilicate  10 g Oral Once  . torsemide  40 mg Oral Daily      Madelon Lips MD [facility] pgr [phone] 07/25/2019, 6:33 AM

## 2019-07-25 NOTE — Progress Notes (Signed)
RN notified by HD that tube feed was complete. RN went to HD to restarted new bag of Vital Cal 1.5 @ rate of 50 mL/per hr. Pt resting in bed. RN will continue to monitor.

## 2019-07-25 NOTE — Progress Notes (Signed)
CRITICAL VALUE ALERT  Critical Value:  K 6.3  Date & Time Notied:  07/25/19 @ 8403   Provider Notified: Warren Lacy  Orders Received/Actions taken:  Advised to call HD who says she will be going first thing this morning for her session.  Notified Elink of this.  No additional orders at this time.  Will continue to monitor.

## 2019-07-25 NOTE — Plan of Care (Signed)
  Problem: Clinical Measurements: Goal: Ability to maintain clinical measurements within normal limits will improve Outcome: Progressing Goal: Respiratory complications will improve Outcome: Progressing Goal: Cardiovascular complication will be avoided Outcome: Progressing   Problem: Coping: Goal: Level of anxiety will decrease Outcome: Progressing   Problem: Elimination: Goal: Will not experience complications related to bowel motility Outcome: Progressing

## 2019-07-26 DIAGNOSIS — R64 Cachexia: Secondary | ICD-10-CM

## 2019-07-26 DIAGNOSIS — J029 Acute pharyngitis, unspecified: Secondary | ICD-10-CM

## 2019-07-26 LAB — RENAL FUNCTION PANEL
Albumin: 2.1 g/dL — ABNORMAL LOW (ref 3.5–5.0)
Anion gap: 11 (ref 5–15)
BUN: 51 mg/dL — ABNORMAL HIGH (ref 8–23)
CO2: 28 mmol/L (ref 22–32)
Calcium: 8.3 mg/dL — ABNORMAL LOW (ref 8.9–10.3)
Chloride: 91 mmol/L — ABNORMAL LOW (ref 98–111)
Creatinine, Ser: 2.77 mg/dL — ABNORMAL HIGH (ref 0.44–1.00)
GFR calc Af Amer: 19 mL/min — ABNORMAL LOW (ref >60)
GFR calc non Af Amer: 17 mL/min — ABNORMAL LOW (ref >60)
Glucose, Bld: 121 mg/dL — ABNORMAL HIGH (ref 70–99)
Phosphorus: 3.5 mg/dL (ref 2.5–4.6)
Potassium: 5.8 mmol/L — ABNORMAL HIGH (ref 3.5–5.1)
Sodium: 130 mmol/L — ABNORMAL LOW (ref 135–145)

## 2019-07-26 LAB — ANTI-SCLERODERMA ANTIBODY: Scleroderma (Scl-70) (ENA) Antibody, IgG: <0.2 AI (ref 0.0–0.9)

## 2019-07-26 MED ORDER — SODIUM ZIRCONIUM CYCLOSILICATE 10 G PO PACK
10.0000 g | PACK | Freq: Once | ORAL | Status: AC
  Administered 2019-07-26: 10 g via ORAL
  Filled 2019-07-26: qty 1

## 2019-07-26 MED ORDER — FENTANYL CITRATE (PF) 100 MCG/2ML IJ SOLN
12.5000 ug | Freq: Once | INTRAMUSCULAR | Status: AC
  Administered 2019-07-26: 12.5 ug via INTRAVENOUS
  Filled 2019-07-26: qty 2

## 2019-07-26 MED ORDER — ACETAMINOPHEN 160 MG/5ML PO SOLN
500.0000 mg | Freq: Four times a day (QID) | ORAL | Status: DC | PRN
  Administered 2019-07-26 – 2019-08-08 (×4): 500 mg
  Filled 2019-07-26 (×4): qty 20.3

## 2019-07-26 NOTE — Progress Notes (Signed)
Nutrition Follow-up  DOCUMENTATION CODES:   Severe malnutrition in context of chronic illness, Underweight  INTERVENTION:   RD will monitor bilateral nares for pressure injury development. If needed, can replace or adjust Cortrak on Friday, 07/27/18.  RD will monitor for [place] discussions regarding G-tube.  Continue tube feeding via Cortrak: - Vital 1.5 @ 50 ml/hr (1200 ml/day)  Tube feeding regimenprovides1800kcal, 81grams of protein, and 963m of H2O.  - ContinueEnsure Enlive poTID, each supplement provides 350 kcal and 20 grams of protein  NUTRITION DIAGNOSIS:   Severe Malnutrition related to chronic illness (pericardial effusion w/ volume overload) as evidenced by moderate fat depletion, severe muscle depletion, percent weight loss.  Ongoing, being addressed via TF  GOAL:   Patient will meet greater than or equal to 90% of their needs  Met via TF  MONITOR:   PO intake, Supplement acceptance, Labs, TF tolerance, Weight trends, I & O's  REASON FOR ASSESSMENT:   Malnutrition Screening Tool, Consult Assessment of nutrition requirement/status  ASSESSMENT:   Patient with PMH significant for chronic pericardial effusion, moderate AVR, HTN, hepatic steatosis, and peripheral neuropathy. Presents this admission with anasarca and bilateral pleural effusions.  12/15 - s/p bilateral thoracentesiswith700 ml fluid removed 12/17 - s/p cardiac cath 12/23 - s/p VATS, pericardial window 12/30 - Cortrak placed, tip in stomach  Pt continues to eat poorly due to sore throat. MD has ordered both Magic mouthwash and Nystatin. No improvement with either per MD. MD checking for strep throat. Per MD note, "if Strep is negative and she wants to continue aggressive care, would need to transition to G-tube. She is unsure whether she wants to pursue this. Will ask Palliative Care to see her and her husband to discuss goals of care." Palliative consult has been ordered.  Per RN note,  pt complaining of "burning" sensation to upper palate of mouth which would not be associated with Cortrak tube placement. Suspect other underlying issues are causing mouth/throat burning and soreness.  Next HD planned for 1/05. Per Nephrology, "we are still hopeful for recovery."  No meal completions documented since 1/03.  Current weight is 3 lbs above admission weight. Pt continues to have moderate pitting edema to BLE.  Medications reviewed and include: Ensure Enlive TID, Magic Mouthwash TID, Nystatin QID  Labs reviewed: sodium 130, potassium 5.8, chloride 91, BUN 51, creatinine 2.77, hemoglobin 9.0  UOP: 120 ml x 24 hours HD net UF 1/05: 712 ml Post-HD weight: 51 kg  Diet Order:   Diet Order            Diet regular Room service appropriate? Yes; Fluid consistency: Thin; Fluid restriction: 1500 mL Fluid  Diet effective now              EDUCATION NEEDS:   Education needs have been addressed  Skin:  Skin Assessment: Skin Integrity Issues: Stage II: sacrum, bilateral nares Incisions: chest, left neck Other: cracking/dryness to BLE  Last BM:  07/26/19 large type 4  Height:   Ht Readings from Last 1 Encounters:  07/04/19 _0  (1.676 m)    Weight:   Wt Readings from Last 1 Encounters:  07/26/19 53.2 kg    Ideal Body Weight:  59.1 kg  BMI:  Body mass index is 18.93 kg/m.  Estimated Nutritional Needs:   Kcal:  1600-1800 kcal  Protein:  80-95 grams  Fluid:  >/= 1.6 L/day    KGaynell Face MS, RD, LDN Inpatient Clinical Dietitian Pager: [phone]/After Hours: [phone]

## 2019-07-26 NOTE — Progress Notes (Signed)
Progress Note  [name] Date of Encounter: 07/26/2019  Primary Cardiologist: Evalina Field, MD   Subjective   She continues to have a very sore throat that makes it nearly impossible for her to eat.  No improvement with magic mouthwash or nystatin swish/swallow.  Inpatient Medications    Scheduled Meds: . carvedilol  3.125 mg Oral BID WC  . Chlorhexidine Gluconate Cloth  6 each Topical Daily  . Chlorhexidine Gluconate Cloth  6 each Topical Q0600  . collagenase   Topical BID  . darbepoetin (ARANESP) injection - NON-DIALYSIS  100 mcg Subcutaneous Q Fri-1800  . diphenhydrAMINE  25 mg Oral Once  . feeding supplement (ENSURE ENLIVE)  237 mL Oral TID BM  . gabapentin  100 mg Oral TID  . Gerhardt's butt cream   Topical TID  . heparin  5,000 Units Subcutaneous Q8H  . magic mouthwash w/lidocaine  5 mL Oral TID  . mouth rinse  15 mL Mouth Rinse BID  . neomycin-bacitracin-polymyxin   Topical BID  . nystatin  5 mL Mouth/Throat QID  . prochlorperazine  5 mg Oral Once  . sertraline  25 mg Oral BID  . sodium chloride flush  3 mL Intravenous Q12H  . sodium chloride flush  3 mL Intravenous Q12H  . sodium zirconium cyclosilicate  10 g Oral Once  . sodium zirconium cyclosilicate  10 g Oral Once   Continuous Infusions: . sodium chloride 500 mL (07/06/19 1043)  . sodium chloride    . sodium chloride    . sodium chloride    . feeding supplement (VITAL 1.5 CAL) 1,000 mL (07/24/19 1247)   PRN Meds: sodium chloride, sodium chloride, sodium chloride, sodium chloride, acetaminophen, alteplase, alum & mag hydroxide-simeth, bisacodyl, bisacodyl, camphor-menthol, heparin, hydrOXYzine, lidocaine (PF), lidocaine-prilocaine, morphine injection, ondansetron (ZOFRAN) IV, oxyCODONE, pentafluoroprop-tetrafluoroeth, polyethylene glycol, polyvinyl alcohol, promethazine, senna-docusate, sodium chloride flush, traMADol, witch hazel-glycerin   Vital Signs    Vitals:   07/25/19 1955  07/25/19 2330 07/26/19 0429 07/26/19 0737  BP: 117/75 116/72 114/74 117/72  Pulse: 80 86 83 81  Resp: (!) 22 (!) 23 19 18   Temp: 97.7 F (36.5 C) 97.6 F (36.4 C) 98.4 F (36.9 C) 98.1 F (36.7 C)  TempSrc: Oral Axillary Axillary Axillary  SpO2: 97% 99% 96% 94%  Weight:   53.2 kg   Height:        Intake/Output Summary (Last 24 hours) at 07/26/2019 0934 Last data filed at 07/26/2019 0431 Gross per 24 hour  Intake 240 ml  Output 832 ml  Net -592 ml   Last 3 Weights 07/26/2019 07/25/2019 07/25/2019  Weight (lbs) 117 lb 4.6 oz 112 lb 7 oz 113 lb 15.7 oz  Weight (kg) 53.2 kg 51 kg 51.7 kg      Telemetry    Sinus rhythm.  No events - Personally Reviewed  ECG    n/a - Personally Reviewed  Physical Exam   VS:  BP 117/72 (BP Location: Left Arm)   Pulse 81   Temp 98.1 F (36.7 C) (Axillary)   Resp 18   Ht 5' 6"  (1.676 m)   Wt 53.2 kg   SpO2 94%   BMI 18.93 kg/m  , BMI Body mass index is 18.93 kg/m. GENERAL:  Cachectic, chronically ill-appearing woman in no acute distress.  Weak. HEENT: Pupils equal round and reactive, fundi not visualized, oral mucosa dry.  Large tongue. NECK:  + jugular venous distention, waveform within normal limits, carotid upstroke brisk and  symmetric, no bruits LUNGS:  Clear on anterior exam HEART:  RRR.  PMI not displaced or sustained,S1 and S2 within normal limits, no S3, no S4, no clicks, no rubs, no murmurs ABD:  Flat, positive bowel sounds normal in frequency in pitch, no bruits.  +flank edema EXT:  2 plus pulses throughout, 1+ bilateral LE edema, no cyanosis no clubbing SKIN: Scleroderma-like appearance NEURO:  Cranial nerves II through XII grossly intact, motor grossly intact throughout PSYCH:  Cognitively intact, oriented to person place and time   Labs    High Sensitivity Troponin:   Recent Labs  Lab 07/03/19 1739 07/03/19 1930  TROPONINIHS 149* 163*      Chemistry Recent Labs  Lab 07/25/19 0454 07/25/19 0925 07/26/19 0530  NA  131* 131* 130*  K 6.3* 4.1 5.8*  CL 92* 93* 91*  CO2 26 28 28   GLUCOSE 115* 120* 121*  BUN 85* 32* 51*  CREATININE 3.96* 1.81* 2.77*  CALCIUM 8.6* 7.6* 8.3*  ALBUMIN 2.2* 2.2* 2.1*  GFRNONAA 11* 28* 17*  GFRAA 13* 32* 19*  ANIONGAP 13 10 11      Hematology Recent Labs  Lab 07/20/19 0515 07/25/19 0454  WBC 9.5 8.5  RBC 3.28* 3.15*  HGB 9.5* 9.0*  HCT 30.7* 30.6*  MCV 93.6 97.1  MCH 29.0 28.6  MCHC 30.9 29.4*  RDW 19.0* 19.6*  PLT 263 198    BNPNo results for input(s): BNP, PROBNP in the last 168 hours.   DDimer No results for input(s): DDIMER in the last 168 hours.   Radiology    No results found.  Cardiac Studies   Echo 07/17/19: IMPRESSIONS   1. Left ventricular ejection fraction, by visual estimation, is 50 to 55%. The left ventricle has low normal function. There is mildly increased left ventricular hypertrophy.  2. Moderate hypokinesis of the left ventricular, entire septal wall.  3. The left ventricle demonstrates regional wall motion abnormalities.  4. Global right ventricle has normal systolic function.The right ventricular size is normal. No increase in right ventricular wall thickness.  5. Left atrial size was normal.  6. Right atrial size was mildly dilated.  7. Moderate pleural effusion in the left lateral region.  8. The pericardial effusion is circumferential.  9. Trivial pericardial effusion is present. 10. The mitral valve is abnormal. Mild mitral valve regurgitation. 11. The tricuspid valve is grossly normal. Tricuspid valve regurgitation moderate. 12. Aortic valve regurgitation is moderate. 13. The aortic valve is tricuspid. Aortic valve regurgitation is moderate. Mild aortic valve sclerosis without stenosis. 14. The pulmonic valve was grossly normal. Pulmonic valve regurgitation is not visualized. 15. Moderately elevated pulmonary artery systolic pressure. 16. The inferior vena cava is dilated in size with >50% respiratory variability,  suggesting right atrial pressure of 8 mmHg.  RHC/LHC 07/06/19: Findings:  RA = 7  RV = 42/7 PA = 48/17 (28) PCW = 16 LV = 122/8 Fick cardiac output/index = 5.0/3.2 PVR = 2.4 WU FA sat = 98% PA sat = 62%, 62%  Prominent y-descents in RA tracing but no square root sign in ventricular tracings and no RV/LV interdeprendence  After 350 cc NS bolus  RA = 9 RV = 46/9 PA = 48/24 (33) PCW = 23 LV = 125/15  Prominent y-descents in RA tracing but no square root sign in ventricular tracings and no RV/LV interdependence  Assessment: 1. Moderate pericardial effusion 2. Mild PAH 3. Normal filling pressures with no evidence of tamponade or constrictive physiology before/after fluid loading  Patient Profile     69 y.o. female with moderate AR, anasarca, mild hepatic steatosis admitted 12/14 with volume overload and tachycardia.  Hospitalization has been complicated by AKI now requiring HD and pericardial effusion s/p pericardial windown.  Assessment & Plan    # Anasarca: # Protein deficiency: Volume removal with HD.  Thought to be due to liver disease and poor oral intake with low protein.  However a definitive diagnosis has not been made.  She had an autoimmune work up that had elevated CRP with normal ESR.  She had negative RF, anti-Jo/Ro, ANCA, ANA, SPEP, and normal thyroid studies.  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  Liver biopsy at [facility] showed mild hepatic steatosis but no cirrhosis.  Negative EGD biopsy for amyloidosis.  Her hands and face have a scleroderma-like appearance.  ESR remains minimally elevated.  Anti-scl pending.  She is now getting tube feeds with minimal improvement in albumin.    # Sore throat:  She can't eat 2/2 sore throat which isn't improving with magic mouthwash or nystatin.  Will check for strep.  I suspect the tube is just irritating her throat.  If Strep is negative and she wants to continue aggressive care, would need to transition to  g-tube.  She is unsure whether she wants to pursue this.  Will ask Palliative Care to see her and her husband to discuss goals of care.   # Large pericardial effusion: S/p pericardial window.  No tamponade on LHC/RHC.  However, she underwent pericardial window due to worsening clinical course.  No renal improvement after the procedure.    # Moderate AR: Diuresis with HD as above.    # Hypertension: BP stable on carvedilol.  # AKI: Thought to be related to hypotension and IV contrast.  Autoimmune work up has been negative in the past.  No renal recovery.  Patient seen in HD today.  # Dispo: Medically OK for CIR discharge if aggressive care will be continued.  Palliative consult today to discuss whether she wants g-tube and aggressive measures or palliation.   For questions or updates, please contact [place] Please consult [email or web address] for contact info under        Signed, Skeet Latch, MD  07/26/2019, 9:34 AM

## 2019-07-26 NOTE — Plan of Care (Signed)
  Problem: Clinical Measurements: Goal: Ability to maintain clinical measurements within normal limits will improve Outcome: Progressing Goal: Respiratory complications will improve Outcome: Progressing   Problem: Activity: Goal: Risk for activity intolerance will decrease Outcome: Not Progressing   Problem: Nutrition: Goal: Adequate nutrition will be maintained Outcome: Progressing

## 2019-07-26 NOTE — Progress Notes (Addendum)
RN called to patient room. Pt advise upper palate of mouth burn hurting. Patient drooling from mouth. Pt having difficulty swallow. Patient refused 1400 nystatin dose states does not work. RN awaiting response will continue to monitor.   1555Page returned spoke with Dr. Oval Linsey advise of situation. Orders being placed. Awaiting orders. Will continue to monitor.

## 2019-07-26 NOTE — Progress Notes (Signed)
[facility] Progress Note    Assessment/ Plan:   Pt is a 69 y.o. yo female  with history of HTN, former smoker, hepatic steatosis with low albumin, chronic pericardial effusion, moderate AR admitted on 12/17 for progressive dyspnea, anasarca and fatigue, seen as a consultationfor AKIand fluid overload.  #Acute kidney injury,nonoliguric (crt normal in Sept, was 1.43 on 07/03/19) : Likely hemodynamically mediated concomitant with use of IV contrast- U/A not remarkable.  Autoimmune work-up unremarkable in the past. No improvement despite Foley catheter.  Started HD 07/18/19 with IR placed [facility] for progressive uremia.  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   #Hyperkalemia:  K 5.8 today, give lokelma 10g.   #Hyponatremia, hypervolemic: improved with HD, CTM  #Anasarca presumably due to liver disease which was diagnosed at [facility]. She has a history of liver steatosis.No evidence of GN/nephrotic syndrome. 3rd spacing also. Remains on diuretics for now (torsemide 40 daily) - not really having effect so D/C   #Moderate to large pericardial effusion:  s/p pericardial window on 12/23- TCTS following, stable  #Hypertension: Blood pressure acceptable. On low dose Coreg. . . #Anemia: s/p transfusion on 12/22.  gave feraheme and one dose of ESA on 12/23.  Hb 9.    #Hyperphosphatemia:  Improved with dialysis.  Phos in the 3s currently.    Dispo:  She can move to CIR and cont HD.   Subjective:    HD yesterday, tolerated ok.  No specific complaints but seems more fatigued and discouraged. NG tube uncomfortable.   Objective:   BP 117/72 (BP Location: Left Arm)   Pulse 81   Temp 98.1 F (36.7 C) (Axillary)   Resp 18   Ht 5' 6"  (1.676 m)   Wt 53.2 kg   SpO2 94%   BMI 18.93 kg/m   Intake/Output  Summary (Last 24 hours) at 07/26/2019 0819 Last data filed at 07/26/2019 0431 Gross per 24 hour  Intake 240 ml  Output 832 ml  Net -592 ml   Weight change: -0.5 kg  Physical Exam: Gen: cachectic, fatigued CVS: RRR, well-healing scars Resp: normal WOB Abd: thin, mildly painful to palpation Ext: significant flank edema ACCESS: L TDC c/d/i  Imaging: No results found.  Labs: BMET Recent Labs  Lab 07/21/19 0429 07/22/19 0347 07/23/19 0350 07/24/19 0408 07/25/19 0454 07/25/19 0925 07/26/19 0530  NA 134* 129* 127* 130* 131* 131* 130*  K 3.7 4.5 6.1* 5.1 6.3* 4.1 5.8*  CL 96* 91* 90* 90* 92* 93* 91*  CO2 27 23 26 28 26 28 28   GLUCOSE 113* 119* 112* 111* 115* 120* 121*  BUN 50* 64* 87* 56* 85* 32* 51*  CREATININE 2.95* 3.68* 4.45* 3.17* 3.96* 1.81* 2.77*  CALCIUM 8.1* 8.1* 8.3* 8.2* 8.6* 7.6* 8.3*  PHOS 3.0 3.5 3.8 3.6 3.9 1.8* 3.5   CBC Recent Labs  Lab 07/20/19 0515 07/25/19 0454  WBC 9.5 8.5  HGB 9.5* 9.0*  HCT 30.7* 30.6*  MCV 93.6 97.1  PLT 263 198    Medications:    . carvedilol  3.125 mg Oral BID WC  . Chlorhexidine Gluconate Cloth  6 each Topical Daily  . Chlorhexidine Gluconate Cloth  6 each Topical Q0600  . collagenase   Topical BID  . darbepoetin (ARANESP) injection - NON-DIALYSIS  100 mcg Subcutaneous Q Fri-1800  .  diphenhydrAMINE  25 mg Oral Once  . feeding supplement (ENSURE ENLIVE)  237 mL Oral TID BM  . gabapentin  100 mg Oral TID  . Gerhardt's butt cream   Topical TID  . heparin  5,000 Units Subcutaneous Q8H  . magic mouthwash w/lidocaine  5 mL Oral TID  . mouth rinse  15 mL Mouth Rinse BID  . neomycin-bacitracin-polymyxin   Topical BID  . nystatin  5 mL Mouth/Throat QID  . prochlorperazine  5 mg Oral Once  . sertraline  25 mg Oral BID  . sodium chloride flush  3 mL Intravenous Q12H  . sodium chloride flush  3 mL Intravenous Q12H  . sodium zirconium cyclosilicate  10 g Oral Once  . torsemide  40 mg Oral Daily      Madelon Lips  MD [facility] pgr [phone] 07/26/2019, 8:19 AM

## 2019-07-26 NOTE — Progress Notes (Signed)
PT Cancellation Note  Patient Details Name: Joanna Ali MRN: [id number] DOB: 08/09/1950   Cancelled Treatment:    Reason Eval/Treat Not Completed: Patient declined, no reason specified. PT in a significant amount of pain and having a very bad day per report of visitor. Pt and visitor declining PT treatment at this time. PT will continue to follow per POC.   Zenaida Niece 07/26/2019, 4:41 PM

## 2019-07-27 ENCOUNTER — Inpatient Hospital Stay (HOSPITAL_COMMUNITY): Payer: Medicare Other

## 2019-07-27 DIAGNOSIS — J189 Pneumonia, unspecified organism: Secondary | ICD-10-CM

## 2019-07-27 LAB — CBC
HCT: 32 % — ABNORMAL LOW (ref 36.0–46.0)
Hemoglobin: 9.4 g/dL — ABNORMAL LOW (ref 12.0–15.0)
MCH: 28.4 pg (ref 26.0–34.0)
MCHC: 29.4 g/dL — ABNORMAL LOW (ref 30.0–36.0)
MCV: 96.7 fL (ref 80.0–100.0)
Platelets: 225 10*3/uL (ref 150–400)
RBC: 3.31 MIL/uL — ABNORMAL LOW (ref 3.87–5.11)
RDW: 19.8 % — ABNORMAL HIGH (ref 11.5–15.5)
WBC: 11.3 10*3/uL — ABNORMAL HIGH (ref 4.0–10.5)
nRBC: 0 % (ref 0.0–0.2)

## 2019-07-27 LAB — GLUCOSE, CAPILLARY
Glucose-Capillary: 50 mg/dL — ABNORMAL LOW (ref 70–99)
Glucose-Capillary: 174 mg/dL — ABNORMAL HIGH (ref 70–99)
Glucose-Capillary: 69 mg/dL — ABNORMAL LOW (ref 70–99)

## 2019-07-27 LAB — RENAL FUNCTION PANEL
Albumin: 2.3 g/dL — ABNORMAL LOW (ref 3.5–5.0)
Anion gap: 13 (ref 5–15)
BUN: 81 mg/dL — ABNORMAL HIGH (ref 8–23)
CO2: 25 mmol/L (ref 22–32)
Calcium: 8.5 mg/dL — ABNORMAL LOW (ref 8.9–10.3)
Chloride: 90 mmol/L — ABNORMAL LOW (ref 98–111)
Creatinine, Ser: 3.75 mg/dL — ABNORMAL HIGH (ref 0.44–1.00)
GFR calc Af Amer: 13 mL/min — ABNORMAL LOW (ref >60)
GFR calc non Af Amer: 12 mL/min — ABNORMAL LOW (ref >60)
Glucose, Bld: 133 mg/dL — ABNORMAL HIGH (ref 70–99)
Phosphorus: 4.6 mg/dL (ref 2.5–4.6)
Potassium: 6.8 mmol/L (ref 3.5–5.1)
Sodium: 128 mmol/L — ABNORMAL LOW (ref 135–145)

## 2019-07-27 LAB — PROTIME-INR
INR: 1.1 (ref 0.8–1.2)
Prothrombin Time: 14.1 seconds (ref 11.4–15.2)

## 2019-07-27 MED ORDER — DEXTROSE 50 % IV SOLN
INTRAVENOUS | Status: AC
  Administered 2019-07-27: 100 mL
  Filled 2019-07-27: qty 50

## 2019-07-27 MED ORDER — SODIUM CHLORIDE 0.9 % IV SOLN: INTRAVENOUS | Status: DC

## 2019-07-27 MED ORDER — HEPARIN SODIUM (PORCINE) 1000 UNIT/ML IJ SOLN
INTRAMUSCULAR | Status: AC
  Administered 2019-07-27: 3800 [IU] via INTRAVENOUS_CENTRAL
  Filled 2019-07-27: qty 4

## 2019-07-27 MED ORDER — NEPRO/CARBSTEADY PO LIQD
1000.0000 mL | ORAL | Status: DC
  Filled 2019-07-27 (×3): qty 1000

## 2019-07-27 MED ORDER — PRO-STAT SUGAR FREE PO LIQD
30.0000 mL | Freq: Every day | ORAL | Status: DC
  Administered 2019-07-29 – 2019-08-23 (×23): 30 mL
  Filled 2019-07-27 (×20): qty 30

## 2019-07-27 MED ORDER — CEFAZOLIN SODIUM-DEXTROSE 2-4 GM/100ML-% IV SOLN
2.0000 g | INTRAVENOUS | Status: AC
  Administered 2019-07-28: 2 g via INTRAVENOUS
  Filled 2019-07-27: qty 100

## 2019-07-27 MED ORDER — FENTANYL CITRATE (PF) 100 MCG/2ML IJ SOLN
25.0000 ug | INTRAMUSCULAR | Status: DC | PRN
  Administered 2019-07-29 – 2019-07-31 (×4): 25 ug via INTRAVENOUS
  Filled 2019-07-27 (×4): qty 2

## 2019-07-27 MED ORDER — DEXTROSE 50 % IV SOLN
INTRAVENOUS | Status: AC
  Administered 2019-07-27: 25 mL
  Filled 2019-07-27: qty 50

## 2019-07-27 MED ORDER — DEXTROSE 10 % IV SOLN: INTRAVENOUS | Status: DC

## 2019-07-27 MED ORDER — HEPARIN SODIUM (PORCINE) 5000 UNIT/ML IJ SOLN
5000.0000 [IU] | Freq: Three times a day (TID) | INTRAMUSCULAR | Status: DC
  Administered 2019-07-28 – 2019-08-23 (×63): 5000 [IU] via SUBCUTANEOUS
  Filled 2019-07-27 (×64): qty 1

## 2019-07-27 NOTE — Progress Notes (Signed)
Progress Note  [name] Date of Encounter: 07/27/2019  Primary Cardiologist: Evalina Field, MD   Subjective   Throat remains very sore.  Breathing stable.  Tolerating HD.  Inpatient Medications    Scheduled Meds: . carvedilol  3.125 mg Oral BID WC  . Chlorhexidine Gluconate Cloth  6 each Topical Daily  . Chlorhexidine Gluconate Cloth  6 each Topical Q0600  . collagenase   Topical BID  . darbepoetin (ARANESP) injection - NON-DIALYSIS  100 mcg Subcutaneous Q Fri-1800  . diphenhydrAMINE  25 mg Oral Once  . feeding supplement (ENSURE ENLIVE)  237 mL Oral TID BM  . gabapentin  100 mg Oral TID  . Gerhardt's butt cream   Topical TID  . heparin  5,000 Units Subcutaneous Q8H  . magic mouthwash w/lidocaine  5 mL Oral TID  . mouth rinse  15 mL Mouth Rinse BID  . neomycin-bacitracin-polymyxin   Topical BID  . nystatin  5 mL Mouth/Throat QID  . prochlorperazine  5 mg Oral Once  . sertraline  25 mg Oral BID  . sodium chloride flush  3 mL Intravenous Q12H  . sodium chloride flush  3 mL Intravenous Q12H   Continuous Infusions: . sodium chloride 500 mL (07/06/19 1043)  . sodium chloride    . sodium chloride    . sodium chloride    . feeding supplement (VITAL 1.5 CAL) 50 mL/hr at 07/26/19 1900   PRN Meds: sodium chloride, sodium chloride, sodium chloride, sodium chloride, acetaminophen (TYLENOL) oral liquid 160 mg/5 mL, alteplase, alum & mag hydroxide-simeth, bisacodyl, bisacodyl, camphor-menthol, fentaNYL (SUBLIMAZE) injection, heparin, hydrOXYzine, lidocaine (PF), lidocaine-prilocaine, morphine injection, ondansetron (ZOFRAN) IV, oxyCODONE, pentafluoroprop-tetrafluoroeth, polyethylene glycol, polyvinyl alcohol, promethazine, senna-docusate, sodium chloride flush, traMADol, witch hazel-glycerin   Vital Signs    Vitals:   07/27/19 0830 07/27/19 0900 07/27/19 0930 07/27/19 0936  BP: 104/65 119/72 119/74 133/80  Pulse: (!) 107 (!) 108 (!) 106 (!) 104  Resp:    (!)  22  Temp:    97.6 F (36.4 C)  TempSrc:    Oral  SpO2:    94%  Weight:    50.2 kg  Height:        Intake/Output Summary (Last 24 hours) at 07/27/2019 1028 Last data filed at 07/27/2019 0936 Gross per 24 hour  Intake 925 ml  Output 1605 ml  Net -680 ml   Last 3 Weights 07/27/2019 07/27/2019 07/26/2019  Weight (lbs) 110 lb 10.7 oz 114 lb 10.2 oz 117 lb 4.6 oz  Weight (kg) 50.2 kg 52 kg 53.2 kg      Telemetry    Sinus rhythm.  No events - Personally Reviewed  ECG    n/a - Personally Reviewed  Physical Exam   VS:  BP 133/80 (BP Location: Left Arm)   Pulse (!) 104   Temp 97.6 F (36.4 C) (Oral)   Resp (!) 22   Ht _0  (1.676 m)   Wt 50.2 kg   SpO2 94%   BMI 17.86 kg/m  , BMI Body mass index is 17.86 kg/m. GENERAL:  Cachectic, chronically ill-appearing woman in no acute distress.  Weak. HEENT: Pupils equal round and reactive, fundi not visualized, oral mucosa dry.  Large tongue. NECK:  + jugular venous distention, waveform within normal limits, carotid upstroke brisk and symmetric, no bruits LUNGS:  Clear on anterior exam HEART:  RRR.  PMI not displaced or sustained,S1 and S2 within normal limits, no S3, no S4, no clicks, no  rubs, no murmurs ABD:  Flat, positive bowel sounds normal in frequency in pitch, no bruits.  +flank edema EXT:  2 plus pulses throughout, 1+ bilateral LE edema, no cyanosis no clubbing SKIN: Scleroderma-like appearance NEURO:  Cranial nerves II through XII grossly intact, motor grossly intact throughout PSYCH:  Cognitively intact, oriented to person place and time   Labs    High Sensitivity Troponin:   Recent Labs  Lab 07/03/19 1739 07/03/19 1930  TROPONINIHS 149* 163*      Chemistry Recent Labs  Lab 07/25/19 0925 07/26/19 0530 07/27/19 0409  NA 131* 130* 128*  K 4.1 5.8* 6.8*  CL 93* 91* 90*  CO2 _0 GLUCOSE 120* 121* 133*  BUN 32* 51* 81*  CREATININE 1.81* 2.77* 3.75*  CALCIUM 7.6* 8.3* 8.5*  ALBUMIN 2.2* 2.1* 2.3*  GFRNONAA  28* 17* 12*  GFRAA 32* 19* 13*  ANIONGAP _1 Hematology Recent Labs  Lab 07/25/19 0454 07/27/19 0410  WBC 8.5 11.3*  RBC 3.15* 3.31*  HGB 9.0* 9.4*  HCT 30.6* 32.0*  MCV 97.1 96.7  MCH 28.6 28.4  MCHC 29.4* 29.4*  RDW 19.6* 19.8*  PLT 198 225    BNPNo results for input(s): BNP, PROBNP in the last 168 hours.   DDimer No results for input(s): DDIMER in the last 168 hours.   Radiology    No results found.  Cardiac Studies   Echo 07/17/19: IMPRESSIONS   1. Left ventricular ejection fraction, by visual estimation, is 50 to 55%. The left ventricle has low normal function. There is mildly increased left ventricular hypertrophy.  2. Moderate hypokinesis of the left ventricular, entire septal wall.  3. The left ventricle demonstrates regional wall motion abnormalities.  4. Global right ventricle has normal systolic function.The right ventricular size is normal. No increase in right ventricular wall thickness.  5. Left atrial size was normal.  6. Right atrial size was mildly dilated.  7. Moderate pleural effusion in the left lateral region.  8. The pericardial effusion is circumferential.  9. Trivial pericardial effusion is present. 10. The mitral valve is abnormal. Mild mitral valve regurgitation. 11. The tricuspid valve is grossly normal. Tricuspid valve regurgitation moderate. 12. Aortic valve regurgitation is moderate. 13. The aortic valve is tricuspid. Aortic valve regurgitation is moderate. Mild aortic valve sclerosis without stenosis. 14. The pulmonic valve was grossly normal. Pulmonic valve regurgitation is not visualized. 15. Moderately elevated pulmonary artery systolic pressure. 16. The inferior vena cava is dilated in size with >50% respiratory variability, suggesting right atrial pressure of 8 mmHg.  RHC/LHC 07/06/19: Findings:  RA = 7  RV = 42/7 PA = 48/17 (28) PCW = 16 LV = 122/8 Fick cardiac output/index = 5.0/3.2 PVR = 2.4 WU FA sat =  98% PA sat = 62%, 62%  Prominent y-descents in RA tracing but no square root sign in ventricular tracings and no RV/LV interdeprendence  After 350 cc NS bolus  RA = 9 RV = 46/9 PA = 48/24 (33) PCW = 23 LV = 125/15  Prominent y-descents in RA tracing but no square root sign in ventricular tracings and no RV/LV interdependence  Assessment: 1. Moderate pericardial effusion 2. Mild PAH 3. Normal filling pressures with no evidence of tamponade or constrictive physiology before/after fluid loading  Patient Profile     69 y.o. female with moderate AR, anasarca, mild hepatic steatosis admitted 12/14 with volume overload and tachycardia.  Hospitalization has been complicated by AKI  now requiring HD and pericardial effusion s/p pericardial windown.  Assessment & Plan    # Anasarca: # Protein deficiency: Volume removal with HD.  Thought to be due to liver disease and poor oral intake with low protein.  However a definitive diagnosis has not been made.  She had an autoimmune work up that had elevated CRP with normal ESR.  She had negative RF, anti-Jo/Ro, ANCA, ANA, SPEP, and normal thyroid studies.  Anti-scl negative this admission.  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  Liver biopsy at [facility] showed mild hepatic steatosis but no cirrhosis.  Negative EGD biopsy for amyloidosis. She is now getting tube feeds with minimal improvement in albumin.  However the tube is making her miserable.  No improvement with magic mouthwash or nystatin switch/swallow.  Will give fentanyl. Throat culture pending.  Will remove the tube and get GI/IR consult for g-tube placement.  Palliative care to see for goals of care discussion given lack of diagnosis and poor prognosis.  She is too weak and sore to talk today.  Spoke with her husband who reports that they talked last night.  Her goal is to go home and therefore they want to proceed with g-tube.  # Large pericardial effusion: S/p pericardial  window.  No tamponade on LHC/RHC.  However, she underwent pericardial window due to worsening clinical course.  No renal improvement after the procedure.    # Moderate AR: Diuresis with HD as above.    # Hypertension: BP stable on carvedilol.  # AKI: Thought to be related to hypotension and IV contrast.  Autoimmune work up has been negative in the past.  No renal recovery.  Potassium was >6 today.  Patient seen in HD today.  # Dispo: Medically OK for CIR after g-tube placement.   For questions or updates, please contact [place] Please consult [email or web address] for contact info under        Signed, Skeet Latch, MD  07/27/2019, 10:28 AM

## 2019-07-27 NOTE — Progress Notes (Signed)
K 6.8, text paged Cardiology with result.  Pt for HD this am.  Called HD at 6 am to have pt first for HD.

## 2019-07-27 NOTE — Progress Notes (Signed)
Chief Complaint: Patient was seen in consultation today for G-tube  Referring Physician(s): Sande Rives PA-C  Supervising Physician: Markus Daft  Patient Status: [facility] - In-pt  History of Present Illness: Joanna Ali is a 69 y.o. female with severe protein calorie malnutrition secondary to failure to thrive from extensive deconditioning, renal failure. She has been receiving TF via Cortrak and is otherwise not having much po intake. They are preparing her for discharge to rehab and would to transition to percutaneous G-tube to continue enteral nutritional support. Husband at bedside. PMHx, meds, labs, imaging, allergies reviewed.   Past Medical History:  Diagnosis Date  . Hypertension   . Leg swelling 03/2019  . Short of breath on exertion 03/2019    Past Surgical History:  Procedure Laterality Date  . APPENDECTOMY    . IR FLUORO GUIDE CV LINE LEFT  07/18/2019  . IR US GUIDE VASC ACCESS LEFT  07/18/2019  . NECK SURGERY    . RIGHT AND LEFT HEART CATH N/A 07/06/2019   Procedure: RIGHT AND LEFT HEART CATH;  Surgeon: Jolaine Artist, MD;  Location: [place] [facility];  Service: Cardiovascular;  Laterality: N/A;  . TEE WITHOUT CARDIOVERSION N/A 03/28/2019   Procedure: TRANSESOPHAGEAL ECHOCARDIOGRAM (TEE);  Surgeon: Geralynn Rile, MD;  Location: [place];  Service: Cardiology;  Laterality: N/A;  . VIDEO ASSISTED THORACOSCOPY Right 07/12/2019   Procedure: VIDEO ASSISTED THORACOSCOPY Pericardial window ;  Surgeon: Lajuana Matte, MD;  Location: [place];  Service: Thoracic;  Laterality: Right;    Allergies: Patient has no known allergies.  Medications:  Current Facility-Administered Medications:  .  0.9 %  sodium chloride infusion, 250 mL, Intravenous, PRN, Antony Odea, PA-C, Last Rate: 888 mL/hr at 07/06/19 1043, 500 mL at 07/06/19 1043 .  0.9 %  sodium chloride infusion, 250 mL, Intravenous, PRN, Roddenberry, Myron G, PA-C .  0.9 %  sodium  chloride infusion, 100 mL, Intravenous, PRN, Madelon Lips, MD .  0.9 %  sodium chloride infusion, 100 mL, Intravenous, PRN, Madelon Lips, MD .  acetaminophen (TYLENOL) 160 MG/5ML solution 500 mg, 500 mg, Per Tube, Q6H PRN, Skeet Latch, MD, 500 mg at 07/26/19 1201 .  alteplase (CATHFLO ACTIVASE) injection 2 mg, 2 mg, Intracatheter, Once PRN, Madelon Lips, MD .  alum & mag hydroxide-simeth (MAALOX/MYLANTA) 200-200-20 MG/5ML suspension 30 mL, 30 mL, Oral, Q4H PRN, Dorothy Spark, MD, 30 mL at 07/23/19 2252 .  bisacodyl (DULCOLAX) EC tablet 10 mg, 10 mg, Oral, Daily PRN, Rosita Fire, MD .  bisacodyl (DULCOLAX) suppository 10 mg, 10 mg, Rectal, Daily PRN, Madelon Lips, MD .  camphor-menthol [facility]) lotion, , Topical, PRN, Antony Odea, PA-C, Given at 07/08/19 1730 .  carvedilol (COREG) tablet 3.125 mg, 3.125 mg, Oral, BID WC, Donato Heinz, MD, 3.125 mg at 07/27/19 1050 .  Chlorhexidine Gluconate Cloth 2 % PADS 6 each, 6 each, Topical, Daily, Lightfoot, Lucile Crater, MD, 6 each at 07/25/19 1824 .  Chlorhexidine Gluconate Cloth 2 % PADS 6 each, 6 each, Topical, Q0600, Madelon Lips, MD, 6 each at 07/27/19 [phone] .  collagenase (SANTYL) ointment, , Topical, BID, Nahser, Wonda Cheng, MD, Given at 07/26/19 2140 .  Darbepoetin Alfa (ARANESP) injection 100 mcg, 100 mcg, Subcutaneous, Q Fri-1800, Dorothy Spark, MD, 100 mcg at 07/21/19 1739 .  diphenhydrAMINE (BENADRYL) capsule 25 mg, 25 mg, Oral, Once, Roddenberry, Myron G, PA-C .  feeding supplement (NEPRO CARB STEADY) liquid 1,000 mL, 1,000 mL, Oral, Continuous, Oval Linsey, [place],  MD .  feeding supplement (PRO-STAT SUGAR FREE 64) liquid 30 mL, 30 mL, Per Tube, Daily, Skeet Latch, MD .  fentaNYL (SUBLIMAZE) injection 25 mcg, 25 mcg, Intravenous, Q2H PRN, Skeet Latch, MD .  gabapentin (NEURONTIN) capsule 100 mg, 100 mg, Oral, TID, Roddenberry, Myron G, PA-C, 100 mg at 07/27/19 1050 .  Gerhardt's  butt cream, , Topical, TID, Nahser, Wonda Cheng, MD, Given at 07/26/19 2141 .  heparin injection 1,000 Units, 1,000 Units, Dialysis, PRN, Madelon Lips, MD, 3,800 Units at 07/27/19 0944 .  heparin injection 5,000 Units, 5,000 Units, Subcutaneous, Q8H, Donato Heinz, MD, 5,000 Units at 07/26/19 2138 .  hydrOXYzine (ATARAX/VISTARIL) tablet 12.5 mg, 12.5 mg, Oral, BID PRN, Antony Odea, PA-C, 12.5 mg at 07/10/19 1720 .  lidocaine (PF) (XYLOCAINE) 1 % injection 5 mL, 5 mL, Intradermal, PRN, Madelon Lips, MD .  lidocaine-prilocaine (EMLA) cream 1 application, 1 application, Topical, PRN, Madelon Lips, MD .  magic mouthwash w/lidocaine, 5 mL, Oral, TID, Skeet Latch, MD, 5 mL at 07/26/19 2139 .  MEDLINE mouth rinse, 15 mL, Mouth Rinse, BID, Roddenberry, Myron G, PA-C, 15 mL at 07/25/19 2130 .  morphine 2 MG/ML injection 2 mg, 2 mg, Intravenous, Q2H PRN, Roddenberry, Myron G, PA-C, 2 mg at 07/27/19 1040 .  neomycin-bacitracin-polymyxin (NEOSPORIN) ointment packet, , Topical, BID, Daune Perch, NP, Given at 07/26/19 2142 .  nystatin (MYCOSTATIN) 100000 UNIT/ML suspension 500,000 Units, 5 mL, Mouth/Throat, QID, Skeet Latch, MD, 500,000 Units at 07/26/19 2139 .  ondansetron (ZOFRAN) injection 4 mg, 4 mg, Intravenous, Q6H PRN, Roddenberry, Myron G, PA-C, 4 mg at 07/27/19 1045 .  oxyCODONE (Oxy IR/ROXICODONE) immediate release tablet 5-10 mg, 5-10 mg, Oral, Q4H PRN, Roddenberry, Myron G, PA-C, 5 mg at 07/21/19 0420 .  pentafluoroprop-tetrafluoroeth (GEBAUERS) aerosol 1 application, 1 application, Topical, PRN, Madelon Lips, MD .  polyethylene glycol (MIRALAX / GLYCOLAX) packet 17 g, 17 g, Oral, Daily PRN, Rosita Fire, MD .  polyvinyl alcohol (LIQUIFILM TEARS) 1.4 % ophthalmic solution 1 drop, 1 drop, Both Eyes, BID PRN, Einar Grad, RPH .  prochlorperazine (COMPAZINE) tablet 5 mg, 5 mg, Oral, Once, Madelon Lips, MD .  promethazine (PHENERGAN)  injection 12.5 mg, 12.5 mg, Intravenous, Q6H PRN, Skeet Latch, MD .  senna-docusate (Senokot-S) tablet 1 tablet, 1 tablet, Oral, QHS PRN, Rosita Fire, MD .  sertraline (ZOLOFT) tablet 25 mg, 25 mg, Oral, BID, Roddenberry, Myron G, PA-C, 25 mg at 07/27/19 1050 .  sodium chloride flush (NS) 0.9 % injection 3 mL, 3 mL, Intravenous, Q12H, Roddenberry, Myron G, PA-C, 3 mL at 07/27/19 1054 .  sodium chloride flush (NS) 0.9 % injection 3 mL, 3 mL, Intravenous, Q12H, Roddenberry, Myron G, PA-C, 3 mL at 07/27/19 1054 .  sodium chloride flush (NS) 0.9 % injection 3 mL, 3 mL, Intravenous, PRN, Roddenberry, Myron G, PA-C, 3 mL at 07/15/19 1018 .  traMADol (ULTRAM) tablet 50 mg, 50 mg, Oral, Q6H PRN, Antony Odea, PA-C, 50 mg at 07/17/19 0815 .  witch hazel-glycerin (TUCKS) pad, , Topical, PRN, Dorothy Spark, MD    Family History  Problem Relation Age of Onset  . Hypertension Mother     Social History   Socioeconomic History  . Marital status: Married    Spouse name: Not on file  . Number of children: Not on file  . Years of education: Not on file  . Highest education level: Not on file  Occupational History  . Not on file  Tobacco  Use  . Smoking status: Former Smoker    Packs/day: 0.50    Years: 10.00    Pack years: 5.00    Types: Cigarettes    Quit date: 03/20/1979    Years since quitting: 40.3  . Smokeless tobacco: Never Used  . Tobacco comment: pt states she smoked from her early 66s to early 48s.  Substance and Sexual Activity  . Alcohol use: No  . Drug use: No  . Sexual activity: Not on file  Other Topics Concern  . Not on file  Social History Narrative  . Not on file   Social Determinants of Health   Financial Resource Strain:   . Difficulty of Paying Living Expenses: Not on file  Food Insecurity:   . Worried About Charity fundraiser in the Last Year: Not on file  . Ran Out of Food in the Last Year: Not on file  Transportation Needs:   . Lack  of Transportation (Medical): Not on file  . Lack of Transportation (Non-Medical): Not on file  Physical Activity:   . Days of Exercise per Week: Not on file  . Minutes of Exercise per Session: Not on file  Stress:   . Feeling of Stress : Not on file  Social Connections:   . Frequency of Communication with Friends and Family: Not on file  . Frequency of Social Gatherings with Friends and Family: Not on file  . Attends Religious Services: Not on file  . Active Member of Clubs or Organizations: Not on file  . Attends Archivist Meetings: Not on file  . Marital Status: Not on file     Review of Systems: A 12 point ROS discussed and pertinent positives are indicated in the HPI above.  All other systems are negative.  Review of Systems  Vital Signs: BP 135/84 (BP Location: Left Arm)   Pulse (!) 105   Temp 98.3 F (36.8 C) (Oral)   Resp (!) 28   Ht 5' 6"  (1.676 m)   Wt 50.2 kg   SpO2 97%   BMI 17.86 kg/m   Physical Exam Constitutional:      Appearance: She is well-developed.  HENT:     Mouth/Throat:     Mouth: Mucous membranes are moist.     Pharynx: Oropharynx is clear.  Cardiovascular:     Rate and Rhythm: Normal rate and regular rhythm.     Heart sounds: Normal heart sounds.  Pulmonary:     Effort: Pulmonary effort is normal. No respiratory distress.     Breath sounds: Normal breath sounds.  Abdominal:     General: Abdomen is flat. There is no distension.     Palpations: Abdomen is soft.     Tenderness: There is no abdominal tenderness.  Skin:    General: Skin is warm and dry.  Neurological:     General: No focal deficit present.     Mental Status: She is alert and oriented to person, place, and time.  Psychiatric:        Mood and Affect: Mood normal.        Thought Content: Thought content normal.        Judgment: Judgment normal.       Labs:  CBC: Recent Labs    07/14/19 0444 07/20/19 0515 07/25/19 0454 07/27/19 0410  WBC 11.1* 9.5 8.5  11.3*  HGB 9.8* 9.5* 9.0* 9.4*  HCT 32.2* 30.7* 30.6* 32.0*  PLT 245 263 198 225    COAGS:  Recent Labs    03/23/19 1145 04/04/19 2000 04/07/19 0251 07/03/19 1930 07/12/19 0258  INR 1.1 1.2 1.2 1.2 1.2  APTT 28  --   --   --   --     BMP: Recent Labs    07/25/19 0454 07/25/19 0925 07/26/19 0530 07/27/19 0409  NA 131* 131* 130* 128*  K 6.3* 4.1 5.8* 6.8*  CL 92* 93* 91* 90*  CO2 26 28 28 25   GLUCOSE 115* 120* 121* 133*  BUN 85* 32* 51* 81*  CALCIUM 8.6* 7.6* 8.3* 8.5*  CREATININE 3.96* 1.81* 2.77* 3.75*  GFRNONAA 11* 28* 17* 12*  GFRAA 13* 32* 19* 13*    LIVER FUNCTION TESTS: Recent Labs    07/03/19 1930 07/04/19 0822 07/04/19 1424 07/09/19 0347 07/14/19 0444 07/25/19 0454 07/25/19 0925 07/26/19 0530 07/27/19 0409  BILITOT 1.1 0.7  --  0.9 0.6  --   --   --   --   AST 41 40  --  37 32  --   --   --   --   ALT 18 18  --  16 11  --   --   --   --   ALKPHOS 51 46  --  57 49  --   --   --   --   PROT 6.4* 5.7* 6.2* 5.5* 5.3*  --   --   --   --   ALBUMIN 3.1* 2.9* 2.9* 2.6* 2.8* 2.2* 2.2* 2.1* 2.3*    TUMOR MARKERS: No results for input(s): AFPTM, CEA, CA199, CHROMGRNA in the last 8760 hours.  Assessment and Plan: PCM/FTT Long discussion with pt and husband who understand that while G-tube is not permanent, it will need to remain minimum of 8 weeks before consideration of removal. Pt will likely need tube much longer duration regardless. They are agreeable to proceed after discussion of the procedure in detail. Will stop TF and make NPO at [facility] Hold sub-q tomorrow until after procedure. Labs reviewed. Risks and benefits image guided gastrostomy tube placement was discussed with the patient including, but not limited to the need for a barium enema during the procedure, bleeding, infection, peritonitis and/or damage to adjacent structures.  All of the patient's questions were answered, patient is agreeable to proceed.  Consent signed and in  chart.    Thank you for this interesting consult.  I greatly enjoyed meeting Joanna Ali and look forward to participating in their care.  A copy of this report was sent to the requesting provider on this date.  Electronically Signed: Ascencion Dike, PA-C 07/27/2019, 2:38 PM   I spent a total of 25 minutes in face to face in clinical consultation, greater than 50% of which was counseling/coordinating care for G-tube placement

## 2019-07-27 NOTE — Progress Notes (Signed)
Nutrition Follow-up  DOCUMENTATION CODES:   Severe malnutrition in context of chronic illness, Underweight  INTERVENTION:   Tube feeding via Cortrak: - Nepro @ 40 ml/hr (960 ml/day) - Pro-stat 30 ml daily - Free water per MD  Tube feeding regimen provides 1828 kcal, 939 grams of protein, and 698 ml of H2O.   - d/c Ensure Enlive due to pt refusal  NUTRITION DIAGNOSIS:   Severe Malnutrition related to chronic illness (pericardial effusion w/ volume overload) as evidenced by moderate fat depletion, severe muscle depletion, percent weight loss.  Ongoing, being addressed via TF  GOAL:   Patient will meet greater than or equal to 90% of their needs  Met via TF  MONITOR:   PO intake, Supplement acceptance, Labs, TF tolerance, Weight trends, I & O's  REASON FOR ASSESSMENT:   Malnutrition Screening Tool, Consult Assessment of nutrition requirement/status  ASSESSMENT:   Patient with PMH significant for chronic pericardial effusion, moderate AVR, HTN, hepatic steatosis, and peripheral neuropathy. Presents this admission with anasarca and bilateral pleural effusions.  12/15 - s/p bilateral thoracentesiswith700 ml fluid removed 12/17 - s/p cardiac cath 12/23 - s/p VATS, pericardial window 12/30 - Cortrak placed, tip in stomach  RD contacted by RN regarding switching pt's TF formula to lower potassium option. RN reports plan for G-tube tomorrow. Okay to remove Cortrak tube at midnight tonight in anticipation of G-tube placement.  Current weight is 4 lbs below admit weight.  Post-HD weight 50.2 kg.  Spoke with pt and husband at bedside. Explained plan to switch tube feeding formula to Nepro to decrease potassium intake. Pt reports sore throat persists. Pt's husband shares that Nephrology is attributing sore throat to [facility]. Pt and husband confirm plan for G-tube placement tomorrow. Pt requesting to have Cortrak removed. Pt reports that she has not had anything to eat or  drink today due to sore throat. RN reports pt refusing most medications including Magic Mouthwash and Nystatin.  RD will follow for ability to adjust TF regimen to bolus once G-tube placed and cleared for use.  Meal Completion: 10% x 1 meal on 1/06  Medications reviewed and include: Ensure Enlive TID (pt refusing), Magic Mouthwash, Nystatin  Labs reviewed: sodium 128, potassium 6.8, chloride 90, hemoglobin 9.4  UOP: 25 ml x 24 hours HD net UF 1/07: 1580 ml I/O's: -3.2 L since admit  Diet Order:   Diet Order            Diet NPO time specified Except for: Sips with Meds  Diet effective midnight        Diet regular Room service appropriate? Yes; Fluid consistency: Thin; Fluid restriction: 1500 mL Fluid  Diet effective now              EDUCATION NEEDS:   Education needs have been addressed  Skin:  Skin Assessment: Skin Integrity Issues: Skin Integrity Issues: Stage II: sacrum, bilateral nares Incisions: chest, left neck Other: cracking/dryness to BLE  Last BM:  07/26/19  Height:   Ht Readings from Last 1 Encounters:  07/04/19 5' 6"  (1.676 m)    Weight:   Wt Readings from Last 1 Encounters:  07/27/19 50.2 kg    Ideal Body Weight:  59.1 kg  BMI:  Body mass index is 17.86 kg/m.  Estimated Nutritional Needs:   Kcal:  1600-1800 kcal  Protein:  80-95 grams  Fluid:  >/= 1.6 L/day    Gaynell Face, MS, RD, LDN Inpatient Clinical Dietitian Pager: [phone] Weekend/After Hours: [phone]

## 2019-07-27 NOTE — Progress Notes (Signed)
[facility] Progress Note    Assessment/ Plan:   Pt is a 69 y.o. yo female  with history of HTN, former smoker, hepatic steatosis with low albumin, chronic pericardial effusion, moderate AR admitted on 12/17 for progressive dyspnea, anasarca and fatigue, seen as a consultationfor AKIand fluid overload.  #Acute kidney injury,nonoliguric (crt normal in Sept, was 1.43 on 07/03/19) : Likely hemodynamically mediated concomitant with use of IV contrast- U/A not remarkable.  Autoimmune work-up unremarkable in the past. No improvement despite Foley catheter.  Started HD 07/18/19 with IR placed [facility] for progressive uremia.  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   #Hyperkalemia:  K 5.8 yesterday, lokelma 10g given 1/6 but this AM K 6.8.  Will have RN d/w RD tube feeds to be low K -- she will contact them (discussed via phone). Check in AM.  Try to avoid lokelma as it irritated throat.  #Hyponatremia, hypervolemic: improved with HD, CTM  #Anasarca presumably due to liver disease which was diagnosed at [facility]. She has a history of liver steatosis.No evidence of GN/nephrotic syndrome. 3rd spacing also.    #Moderate to large pericardial effusion:  s/p pericardial window on 12/23- TCTS following, stable  #Hypertension: Blood pressure acceptable. On low dose Coreg. . . #Anemia: s/p transfusion on 12/22.  gave feraheme and one dose of ESA on 12/23.  Hb 9.4.    #Hyperphosphatemia:  Improved with dialysis.  Phos in the 4s currently.    **Malnutrition:  G tube being pursued.  Dispo:  She can move to CIR and cont HD.   Subjective:    HD today, tolerated ok. G tube being pursued. Lokelma really irritated throat.   Remains weak.   Objective:   BP 135/84 (BP Location: Left Arm)   Pulse (!) 105   Temp 98.3 F (36.8 C) (Oral)   Resp (!)  28   Ht 5' 6"  (1.676 m)   Wt 50.2 kg   SpO2 97%   BMI 17.86 kg/m   Intake/Output Summary (Last 24 hours) at 07/27/2019 1155 Last data filed at 07/27/2019 1033 Gross per 24 hour  Intake 685 ml  Output 1655 ml  Net -970 ml   Weight change: 0.3 kg  Physical Exam: Gen: cachectic, fatigued CVS: RRR, well-healing scars Resp: normal WOB Abd: thin, mildly painful to palpation Ext: + flank edema ACCESS: L TDC c/d/i  Imaging: No results found.  Labs: BMET Recent Labs  Lab 07/22/19 0347 07/23/19 0350 07/24/19 0408 07/25/19 0454 07/25/19 0925 07/26/19 0530 07/27/19 0409  NA 129* 127* 130* 131* 131* 130* 128*  K 4.5 6.1* 5.1 6.3* 4.1 5.8* 6.8*  CL 91* 90* 90* 92* 93* 91* 90*  CO2 23 26 28 26 28 28 25   GLUCOSE 119* 112* 111* 115* 120* 121* 133*  BUN 64* 87* 56* 85* 32* 51* 81*  CREATININE 3.68* 4.45* 3.17* 3.96* 1.81* 2.77* 3.75*  CALCIUM 8.1* 8.3* 8.2* 8.6* 7.6* 8.3* 8.5*  PHOS 3.5 3.8 3.6 3.9 1.8* 3.5 4.6   CBC Recent Labs  Lab 07/25/19 0454 07/27/19 0410  WBC 8.5 11.3*  HGB 9.0* 9.4*  HCT 30.6* 32.0*  MCV 97.1 96.7  PLT 198 225    Medications:    . carvedilol  3.125 mg Oral BID WC  . Chlorhexidine Gluconate Cloth  6 each Topical Daily  . Chlorhexidine Gluconate  Cloth  6 each Topical [id number]  . collagenase   Topical BID  . darbepoetin (ARANESP) injection - NON-DIALYSIS  100 mcg Subcutaneous Q Fri-1800  . diphenhydrAMINE  25 mg Oral Once  . feeding supplement (ENSURE ENLIVE)  237 mL Oral TID BM  . gabapentin  100 mg Oral TID  . Gerhardt's butt cream   Topical TID  . heparin  5,000 Units Subcutaneous Q8H  . magic mouthwash w/lidocaine  5 mL Oral TID  . mouth rinse  15 mL Mouth Rinse BID  . neomycin-bacitracin-polymyxin   Topical BID  . nystatin  5 mL Mouth/Throat QID  . prochlorperazine  5 mg Oral Once  . sertraline  25 mg Oral BID  . sodium chloride flush  3 mL Intravenous Q12H  . sodium chloride flush  3 mL Intravenous Q12H      Joanna Lips  MD [facility] pgr [phone] 07/27/2019, 11:55 AM

## 2019-07-27 NOTE — Progress Notes (Signed)
Order to remove Cortrak.  Patient is refusing POs at this time, MD aware.  Spoke with [facility] and Dr. Oval Linsey is  okay with removing tube at this time and waiting on G-tube to be placed tomorrow. 07/27/2019 2:03 PM Joanna Ali

## 2019-07-27 NOTE — Progress Notes (Signed)
PT Cancellation Note  Patient Details Name: Navneet Schmuck MRN: [id number] DOB: 1951-05-28   Cancelled Treatment:    Reason Eval/Treat Not Completed: Patient at procedure or test/unavailable. Pt currently off unit for HD. Will continue to follow to progress PT POC as able.    Thelma Comp 07/27/2019, 7:28 AM   Rolinda Roan, PT, DPT Acute Rehabilitation Services Pager: [phone] Office: [phone]

## 2019-07-27 NOTE — Progress Notes (Signed)
OT Cancellation Note  Patient Details Name: Joanna Ali MRN: [id number] DOB: 09/07/1950   Cancelled Treatment:    Reason Eval/Treat Not Completed: Patient at procedure or test/ unavailable(HD). Will follow.  [name] 07/27/2019, 7:50 AM  [name], OTR/L Acute Rehabilitation Services Pager: [phone] Office: [phone]

## 2019-07-27 NOTE — Progress Notes (Signed)
   Spoke with Dr. Anselm Pancoast (Interventional Radiology) about need for G-tube. Informed to put in order for IR consult for G-tube and that they would see. Order placed.  Darreld Mclean, PA-C 07/27/2019 11:03 AM

## 2019-07-28 ENCOUNTER — Inpatient Hospital Stay (HOSPITAL_COMMUNITY): Payer: Medicare Other

## 2019-07-28 DIAGNOSIS — Z7189 Other specified counseling: Secondary | ICD-10-CM

## 2019-07-28 DIAGNOSIS — Z515 Encounter for palliative care: Secondary | ICD-10-CM | POA: Insufficient documentation

## 2019-07-28 DIAGNOSIS — R627 Adult failure to thrive: Secondary | ICD-10-CM | POA: Diagnosis present

## 2019-07-28 HISTORY — PX: IR GASTROSTOMY TUBE MOD SED: IMG625

## 2019-07-28 LAB — GLUCOSE, CAPILLARY
Glucose-Capillary: 101 mg/dL — ABNORMAL HIGH (ref 70–99)
Glucose-Capillary: 102 mg/dL — ABNORMAL HIGH (ref 70–99)
Glucose-Capillary: 109 mg/dL — ABNORMAL HIGH (ref 70–99)
Glucose-Capillary: 139 mg/dL — ABNORMAL HIGH (ref 70–99)
Glucose-Capillary: 188 mg/dL — ABNORMAL HIGH (ref 70–99)
Glucose-Capillary: 200 mg/dL — ABNORMAL HIGH (ref 70–99)
Glucose-Capillary: 42 mg/dL — CL (ref 70–99)
Glucose-Capillary: 52 mg/dL — ABNORMAL LOW (ref 70–99)
Glucose-Capillary: 59 mg/dL — ABNORMAL LOW (ref 70–99)
Glucose-Capillary: 60 mg/dL — ABNORMAL LOW (ref 70–99)
Glucose-Capillary: 66 mg/dL — ABNORMAL LOW (ref 70–99)
Glucose-Capillary: 84 mg/dL (ref 70–99)
Glucose-Capillary: 89 mg/dL (ref 70–99)
Glucose-Capillary: 90 mg/dL (ref 70–99)
Glucose-Capillary: 98 mg/dL (ref 70–99)

## 2019-07-28 LAB — RENAL FUNCTION PANEL
Albumin: 2.3 g/dL — ABNORMAL LOW (ref 3.5–5.0)
Anion gap: 12 (ref 5–15)
BUN: 46 mg/dL — ABNORMAL HIGH (ref 8–23)
CO2: 30 mmol/L (ref 22–32)
Calcium: 8.4 mg/dL — ABNORMAL LOW (ref 8.9–10.3)
Chloride: 92 mmol/L — ABNORMAL LOW (ref 98–111)
Creatinine, Ser: 2.83 mg/dL — ABNORMAL HIGH (ref 0.44–1.00)
GFR calc Af Amer: 19 mL/min — ABNORMAL LOW (ref >60)
GFR calc non Af Amer: 16 mL/min — ABNORMAL LOW (ref >60)
Glucose, Bld: 97 mg/dL (ref 70–99)
Phosphorus: 4.3 mg/dL (ref 2.5–4.6)
Potassium: 5.3 mmol/L — ABNORMAL HIGH (ref 3.5–5.1)
Sodium: 134 mmol/L — ABNORMAL LOW (ref 135–145)

## 2019-07-28 LAB — CULTURE, GROUP A STREP (THRC)

## 2019-07-28 MED ORDER — HEPARIN SODIUM (PORCINE) 1000 UNIT/ML IJ SOLN
3.8000 mL | Freq: Once | INTRAMUSCULAR | Status: AC
  Administered 2019-07-28: 3800 [IU] via INTRAVENOUS

## 2019-07-28 MED ORDER — DEXTROSE 50 % IV SOLN
25.0000 g | Freq: Once | INTRAVENOUS | Status: AC
  Administered 2019-07-28: 25 g via INTRAVENOUS

## 2019-07-28 MED ORDER — FENTANYL CITRATE (PF) 100 MCG/2ML IJ SOLN
INTRAMUSCULAR | Status: AC | PRN
  Administered 2019-07-28 (×2): 25 ug via INTRAVENOUS
  Administered 2019-07-28: 50 ug via INTRAVENOUS

## 2019-07-28 MED ORDER — GLUCAGON HCL RDNA (DIAGNOSTIC) 1 MG IJ SOLR
INTRAMUSCULAR | Status: AC
  Filled 2019-07-28: qty 1

## 2019-07-28 MED ORDER — MIDAZOLAM HCL 2 MG/2ML IJ SOLN
INTRAMUSCULAR | Status: AC
  Filled 2019-07-28: qty 2

## 2019-07-28 MED ORDER — FENTANYL CITRATE (PF) 100 MCG/2ML IJ SOLN
INTRAMUSCULAR | Status: AC
  Filled 2019-07-28: qty 2

## 2019-07-28 MED ORDER — DEXTROSE 50 % IV SOLN
INTRAVENOUS | Status: AC
  Filled 2019-07-28: qty 50

## 2019-07-28 MED ORDER — DEXTROSE 10 % IV SOLN
INTRAVENOUS | Status: DC
  Filled 2019-07-28: qty 1000

## 2019-07-28 MED ORDER — DEXTROSE 50 % IV SOLN
INTRAVENOUS | Status: AC
  Administered 2019-07-28 (×2): 25 mL
  Filled 2019-07-28: qty 50

## 2019-07-28 MED ORDER — DEXTROSE 50 % IV SOLN
25.0000 g | INTRAVENOUS | Status: AC
  Administered 2019-07-28: 25 g via INTRAVENOUS

## 2019-07-28 MED ORDER — CEFAZOLIN SODIUM-DEXTROSE 2-4 GM/100ML-% IV SOLN
INTRAVENOUS | Status: AC
  Filled 2019-07-28: qty 100

## 2019-07-28 MED ORDER — GLUCAGON HCL (RDNA) 1 MG IJ SOLR
INTRAMUSCULAR | Status: AC | PRN
  Administered 2019-07-28: .5 mg via INTRAVENOUS

## 2019-07-28 MED ORDER — MIDAZOLAM HCL 2 MG/2ML IJ SOLN
INTRAMUSCULAR | Status: AC | PRN
  Administered 2019-07-28: 1 mg via INTRAVENOUS
  Administered 2019-07-28 (×2): 0.5 mg via INTRAVENOUS

## 2019-07-28 MED ORDER — HEPARIN SODIUM (PORCINE) 1000 UNIT/ML IJ SOLN
INTRAMUSCULAR | Status: AC
  Filled 2019-07-28: qty 4

## 2019-07-28 MED ORDER — DEXTROSE 50 % IV SOLN
25.0000 g | Freq: Once | INTRAVENOUS | Status: AC
  Administered 2019-07-28: 25 g via INTRAVENOUS
  Filled 2019-07-28: qty 50

## 2019-07-28 MED ORDER — LIDOCAINE HCL 1 % IJ SOLN
INTRAMUSCULAR | Status: AC
  Filled 2019-07-28: qty 20

## 2019-07-28 MED ORDER — IOHEXOL 300 MG/ML  SOLN
50.0000 mL | Freq: Once | INTRAMUSCULAR | Status: AC | PRN
  Administered 2019-07-28: 10 mL

## 2019-07-28 NOTE — Procedures (Signed)
Interventional Radiology Procedure:   Indications: Malnutrition and failure to thrive  Procedure: Gastrostomy tube placement  Findings: 20 Fr tube in stomach  Complications: None     EBL: less than 10 ml  Plan: May use for meds now.  Anticipate using for tube feeds starting tomorrow.    Caly Pellum R. Anselm Pancoast, MD  Pager: [phone]

## 2019-07-28 NOTE — Progress Notes (Signed)
Occupational Therapy Treatment Patient Details Name: Joanna Ali MRN: [id number] DOB: June 24, 1951 Today's Date: 07/28/2019    History of present illness Pt is a 69 year old woman admitted 07/03/19 with anasarca, fatigue and tachycardia. Hospital course complicated by AKI with pt now on HD and pericardial effusion s/p pericardial window. Gtube to be placed 07/28/19. PMH: HTN, former smoker and alcohol abuse, mild hepatic steatosis, chronic pleural effusion, aortic valve regurgitation   OT comments  Pt much less anxious about moving today, requiring more assistance for bed mobility, but less for standing and side stepping along EOB. Pt is significantly weak with tightness throughout her body. Repositioned for comfort and skin integrity in bed. Pt to have gtube placed today.  Follow Up Recommendations  CIR    Equipment Recommendations  Other (comment)(defer to next venue)    Recommendations for Other Services      Precautions / Restrictions Precautions Precautions: Fall Precaution Comments: gtube to be placed 1/8, nausea with movement       Mobility Bed Mobility Overal bed mobility: Needs Assistance Bed Mobility: Supine to Sit;Sit to Supine     Supine to sit: +2 for physical assistance;Total assist Sit to supine: +2 for physical assistance;Total assist   General bed mobility comments: assist for all aspects, pt less anxious about movement today, pointing to her belly indicating discomfort  Transfers Overall transfer level: Needs assistance Equipment used: 2 person hand held assist Transfers: Sit to/from Stand Sit to Stand: +2 physical assistance;Mod assist         General transfer comment: assist to rise using bed pad under hips and gait belt, took several steps toward [facility]     Balance Overall balance assessment: Needs assistance   Sitting balance-Leahy Scale: Fair Sitting balance - Comments: improved posture today, sat x 10 minutes with min guard assist   Standing  balance support: During functional activity;Bilateral upper extremity supported Standing balance-Leahy Scale: Poor Standing balance comment: mod assist of 2                           ADL either performed or assessed with clinical judgement   ADL       Grooming: Brushing hair;Sitting;Total assistance               Lower Body Dressing: Total assistance;Bed level                 General ADL Comments: rubbed ice on pt's lips to moisten, currently NPO for gtube     Vision       Perception     Praxis      Cognition Arousal/Alertness: Awake/alert Behavior During Therapy: Flat affect Overall Cognitive Status: Difficult to assess                                 General Comments: pt talking minimally this visit, able to access her smart phone, aware she has a procedure today        Exercises     Shoulder Instructions       General Comments      Pertinent Vitals/ Pain       Pain Assessment: Faces Faces Pain Scale: Hurts little more Pain Location: abdomen Pain Descriptors / Indicators: Discomfort;Grimacing Pain Intervention(s): Monitored during session;Repositioned  Home Living  Prior Functioning/Environment              Frequency  Min 2X/week        Progress Toward Goals  OT Goals(current goals can now be found in the care plan section)     Acute Rehab OT Goals Patient Stated Goal: to get stronger and go home OT Goal Formulation: With patient Time For Goal Achievement: 08/07/19 Potential to Achieve Goals: Good  Plan Discharge plan remains appropriate    Co-evaluation    PT/OT/SLP Co-Evaluation/Treatment: Yes Reason for Co-Treatment: For patient/therapist safety   OT goals addressed during session: ADL's and self-care;Strengthening/ROM      AM-PAC OT "6 Clicks" Daily Activity     Outcome Measure   Help from another person eating meals?: A  Little Help from another person taking care of personal grooming?: A Lot Help from another person toileting, which includes using toliet, bedpan, or urinal?: Total Help from another person bathing (including washing, rinsing, drying)?: A Lot Help from another person to put on and taking off regular upper body clothing?: A Lot Help from another person to put on and taking off regular lower body clothing?: Total 6 Click Score: 11    End of Session Equipment Utilized During Treatment: Gait belt;Oxygen  OT Visit Diagnosis: Unsteadiness on feet (R26.81);Pain;Muscle weakness (generalized) (M62.81);Other symptoms and signs involving cognitive function   Activity Tolerance Patient tolerated treatment well(VSS)   Patient Left in bed;with call bell/phone within reach   Nurse Communication Mobility status        Time: 7482-7078 OT Time Calculation (min): 24 min  Charges: OT General Charges $OT Visit: 1 Visit OT Treatments $Therapeutic Activity: 8-22 mins  [name], OTR/L Acute Rehabilitation Services Pager: [phone] Office: [phone]   Joanna Ali 07/28/2019, 11:47 AM

## 2019-07-28 NOTE — Progress Notes (Addendum)
Hypoglycemic Event  CBG: 59  Treatment: D50 25 mL (12.5 gm)  Symptoms: None  Follow-up CBG: Time:1200 CBG Result:91  Possible Reasons for Event: Inadequate meal intake, uninterrupted, continuous D10 @ 48m/hr + NS @ 273mhr. NPO for Gtube placement. Pt received 1.5 amps of D50 during prior nightshift then placed on cont. D10.    Will continue to monitor closely.   12:30 Pt transported to IR by RN and transporter.  Bumped O2 up to 4LPM, SpO2 84-87 on 2LPM. Pt states "chest feels tight", last CXR 1/2.  This RN spoke with CaKerrPARidgemarkbout pt updates.  ScPricilla Holm

## 2019-07-28 NOTE — Consult Note (Signed)
Consultation Note Date: 07/28/2019   [name]  DOB: 13-Jan-1951  MRN: [id number]  Age / Sex: 69 y.o., female  PCP: Renaldo Reel, DO Referring Physician: Dorothy Spark, MD  Reason for Consultation: Establishing goals of care  HPI/Patient Profile: 69 y.o. female  with past medical history of hypertension, moderate aortic regurgitation, chronic pericardial effusion, osteoarthritis, anasarca (secondary to liver disease?), macular degeneration admitted on 07/03/2019 with shortness of breath and tachycardia. Hospitalization complicated by acute kidney injury requiring dialysis. 12/15 bilateral thoracentesis and 12/23 pericardial window. PEG placed 1/8.   Clinical Assessment and Goals of Care: I met today at Joanna Ali bedside but she is still sedated from PEG placement and unable to participate in conversation. I met with husband, Joanna Ali, at bedside. We discussed her progression and gradual decline since July beginning with swelling in legs and then into abd. She has also had declining appetite and weakness over this time. She has had work up at [facility] as well as outpatient work up from GI and rheumatology and neurology. Has had work up from cardiology and seen by pulmonology and nephrology while hospitalized here. She has had unfortunate events during hospitalization with need for pericardial window, dialysis, and now feeding tube for nutrition.   I expressed my concern to Joanna Ali that I fear that there is a larger etiology that is the cause of this decline and complications and my fear is that we do not know what this is and if it is not treated she will only continue to decline. I suggest a trial of steroids as the underlying issues appear to be inflammatory issues and if there is an underlying immunological or rheumatological issue this could help her to improve. Joanna Ali is hesitant as he wonders  why this has not been recommended before now and worries about the side effects. We discussed potential side effects. I told him that I could not answer why this has not been considered but steroids are common treatments for many conditions and something my team recommends and uses often that can be very beneficial as well. Joanna Ali wishes to consult the rest of the medical team (as well as hepatologist) before agreeing.   Joanna Ali and I also discussed my concern that without any further intervention and treatment I worry about the trajectory that she has been following. I asked if they have had any discussions regarding what to do if she were to decline further. He tells me that they have not and doesn't feel they are in a place to need these conversations.   They have 1 daughter, 1 granddaughter, and 3 great granddaughters all in [place]. They are hoping for improvement with feeding tube and hopeful for CIR for rehab and therapy. We also discussed palliative care as a support to patients and families dealing with difficult illness and decisions and to make recommendations for symptom management to optimize quality of life.   All questions/concerns addressed. Emotional support provided.   Primary Decision Maker PATIENT and husband    SUMMARY  OF RECOMMENDATIONS   - Follow up for initiation of steroids and fluconazole. I strongly recommend that a trial of steroid treatment should be pursued.  - Discussed recommendations and care with Dr. Rhea Pink.   Code Status/Advance Care Planning:  Full code   Symptom Management:   Recommend trial of Prednisone IV 25 mg daily for underlying inflamatory process of unknown etiology (0.5 mg/kg/day).   Also recommend fluconazole for possible esophageal thrush.  Palliative Prophylaxis:   Aspiration, Bowel Regimen, Delirium Protocol, Frequent Pain Assessment, Oral Care and Turn Reposition  Additional Recommendations (Limitations, Scope,  Preferences):  Full Scope Treatment  Psycho-social/Spiritual:   Desire for further Chaplaincy support:yes  Additional Recommendations: Caregiving  Support/Resources  Prognosis:   Overall prognosis poor if she continues on this trajectory. Work up is ongoing.   Discharge Planning: To Be Determined      Primary Diagnoses: Present on Admission:  Anasarca  Pericardial effusion  Hypertension  Elevated troponin  Atypical pneumonia  Hypoxia   I have reviewed the medical record, interviewed the patient and family, and examined the patient. The following aspects are pertinent.  Past Medical History:  Diagnosis Date   Hypertension    Leg swelling 03/2019   Short of breath on exertion 03/2019   Social History   Socioeconomic History   Marital status: Married    Spouse name: Not on file   Number of children: Not on file   Years of education: Not on file   Highest education level: Not on file  Occupational History   Not on file  Tobacco Use   Smoking status: Former Smoker    Packs/day: 0.50    Years: 10.00    Pack years: 5.00    Types: Cigarettes    Quit date: 03/20/1979    Years since quitting: 40.3   Smokeless tobacco: Never Used   Tobacco comment: pt states she smoked from her early 81s to early 69s.  Substance and Sexual Activity   Alcohol use: No   Drug use: No   Sexual activity: Not on file  Other Topics Concern   Not on file  Social History Narrative   Not on file   Social Determinants of Health   Financial Resource Strain:    Difficulty of Paying Living Expenses: Not on file  Food Insecurity:    Worried About [place] in the Last Year: Not on file   Ran Out of Food in the Last Year: Not on file  Transportation Needs:    Lack of Transportation (Medical): Not on file   Lack of Transportation (Non-Medical): Not on file  Physical Activity:    Days of Exercise per Week: Not on file   Minutes of Exercise per  Session: Not on file  Stress:    Feeling of Stress : Not on file  Social Connections:    Frequency of Communication with Friends and Family: Not on file   Frequency of Social Gatherings with Friends and Family: Not on file   Attends Religious Services: Not on file   Active Member of Clubs or Organizations: Not on file   Attends Archivist Meetings: Not on file   Marital Status: Not on file   Family History  Problem Relation Age of Onset   Hypertension Mother    Scheduled Meds:  carvedilol  3.125 mg Oral BID WC   Chlorhexidine Gluconate Cloth  6 each Topical Daily   Chlorhexidine Gluconate Cloth  6 each Topical Q0600   collagenase  Topical BID   darbepoetin (ARANESP) injection - NON-DIALYSIS  100 mcg Subcutaneous Q Fri-1800   diphenhydrAMINE  25 mg Oral Once   feeding supplement (PRO-STAT SUGAR FREE 64)  30 mL Per Tube Daily   fentaNYL       gabapentin  100 mg Oral TID   Gerhardt's butt cream   Topical TID   heparin  5,000 Units Subcutaneous Q8H   lidocaine       magic mouthwash w/lidocaine  5 mL Oral TID   mouth rinse  15 mL Mouth Rinse BID   midazolam       neomycin-bacitracin-polymyxin   Topical BID   nystatin  5 mL Mouth/Throat QID   prochlorperazine  5 mg Oral Once   sertraline  25 mg Oral BID   sodium chloride flush  3 mL Intravenous Q12H   sodium chloride flush  3 mL Intravenous Q12H   Continuous Infusions:  sodium chloride 500 mL (07/06/19 1043)   sodium chloride     sodium chloride     sodium chloride     sodium chloride 25 mL/hr at 07/28/19 0800   ceFAZolin      ceFAZolin (ANCEF) IV     dextrose 25 mL/hr at 07/28/19 0800   feeding supplement (NEPRO CARB STEADY)     PRN Meds:.sodium chloride, sodium chloride, sodium chloride, sodium chloride, acetaminophen (TYLENOL) oral liquid 160 mg/5 mL, alteplase, alum & mag hydroxide-simeth, bisacodyl, bisacodyl, camphor-menthol, fentaNYL (SUBLIMAZE) injection, heparin,  hydrOXYzine, lidocaine (PF), lidocaine-prilocaine, morphine injection, ondansetron (ZOFRAN) IV, oxyCODONE, pentafluoroprop-tetrafluoroeth, polyethylene glycol, polyvinyl alcohol, promethazine, senna-docusate, sodium chloride flush, traMADol, witch hazel-glycerin No Known Allergies Review of Systems  Unable to perform ROS: Other  Constitutional:       Sedated s/p PEG placement    Physical Exam Vitals and nursing note reviewed.  Constitutional:      General: She is sleeping. She is not in acute distress.    Appearance: She is cachectic. She is ill-appearing.     Comments: Thin, frail  Cardiovascular:     Rate and Rhythm: Normal rate.  Pulmonary:     Effort: Pulmonary effort is normal. No tachypnea, accessory muscle usage or respiratory distress.     Comments: Shallow breathes Skin:    Comments: Hands and feet cold to touch  Neurological:     Mental Status: She is lethargic.     Vital Signs: BP (!) 150/92    Pulse 100    Temp 97.9 F (36.6 C) (Axillary)    Resp (!) 24    Ht 5' 6"  (1.676 m)    Wt 51.1 kg    SpO2 93%    BMI 18.18 kg/m  Pain Scale: 0-10 POSS *See Group Information*: 1-Acceptable,Awake and alert Pain Score: 3    SpO2: SpO2: 93 % O2 Device:SpO2: 93 % O2 Flow Rate: .O2 Flow Rate (L/min): 4 L/min  IO: Intake/output summary:   Intake/Output Summary (Last 24 hours) at 07/28/2019 1310 Last data filed at 07/28/2019 1200 Gross per 24 hour  Intake 903.52 ml  Output 165 ml  Net 738.52 ml    LBM: Last BM Date: 07/26/19 Baseline Weight: Weight: 51.9 kg Most recent weight: Weight: 51.1 kg     Palliative Assessment/Data:     Time In: 1400 Time Out: 1520 Time Total: 80 min Greater than 50%  of this time was spent counseling and coordinating care related to the above assessment and plan.  Signed by: Vinie Sill, NP Palliative Medicine Team Pager # [phone] (M-F 8a-5p)  Team Phone # [phone] (Nights/Weekends)

## 2019-07-28 NOTE — Progress Notes (Signed)
Progress Note  [name] Date of Encounter: 07/28/2019  Primary Cardiologist: Evalina Field, MD   Subjective   Throat less sore.  She is happy to have the NG tube out.  Breathing stable.  Tolerating HD.  Inpatient Medications    Scheduled Meds: . carvedilol  3.125 mg Oral BID WC  . Chlorhexidine Gluconate Cloth  6 each Topical Daily  . Chlorhexidine Gluconate Cloth  6 each Topical Q0600  . collagenase   Topical BID  . darbepoetin (ARANESP) injection - NON-DIALYSIS  100 mcg Subcutaneous Q Fri-1800  . diphenhydrAMINE  25 mg Oral Once  . feeding supplement (PRO-STAT SUGAR FREE 64)  30 mL Per Tube Daily  . gabapentin  100 mg Oral TID  . Gerhardt's butt cream   Topical TID  . heparin  5,000 Units Subcutaneous Q8H  . magic mouthwash w/lidocaine  5 mL Oral TID  . mouth rinse  15 mL Mouth Rinse BID  . neomycin-bacitracin-polymyxin   Topical BID  . nystatin  5 mL Mouth/Throat QID  . prochlorperazine  5 mg Oral Once  . sertraline  25 mg Oral BID  . sodium chloride flush  3 mL Intravenous Q12H  . sodium chloride flush  3 mL Intravenous Q12H   Continuous Infusions: . sodium chloride 500 mL (07/06/19 1043)  . sodium chloride    . sodium chloride    . sodium chloride    . sodium chloride 25 mL/hr at 07/28/19 0400  .  ceFAZolin (ANCEF) IV    . dextrose 25 mL/hr at 07/28/19 0400  . feeding supplement (NEPRO CARB STEADY)     PRN Meds: sodium chloride, sodium chloride, sodium chloride, sodium chloride, acetaminophen (TYLENOL) oral liquid 160 mg/5 mL, alteplase, alum & mag hydroxide-simeth, bisacodyl, bisacodyl, camphor-menthol, fentaNYL (SUBLIMAZE) injection, heparin, hydrOXYzine, lidocaine (PF), lidocaine-prilocaine, morphine injection, ondansetron (ZOFRAN) IV, oxyCODONE, pentafluoroprop-tetrafluoroeth, polyethylene glycol, polyvinyl alcohol, promethazine, senna-docusate, sodium chloride flush, traMADol, witch hazel-glycerin   Vital Signs    Vitals:   07/28/19  0401 07/28/19 0614 07/28/19 0731 07/28/19 0734  BP: (!) 136/91   (!) 149/90  Pulse: 99  93   Resp: 15  17   Temp: (!) 97.4 F (36.3 C)  97.7 F (36.5 C)   TempSrc: Oral  Axillary   SpO2: 98%  96%   Weight:  51.1 kg    Height:        Intake/Output Summary (Last 24 hours) at 07/28/2019 0826 Last data filed at 07/28/2019 0406 Gross per 24 hour  Intake 503.4 ml  Output 1745 ml  Net -1241.6 ml   Last 3 Weights 07/28/2019 07/27/2019 07/27/2019  Weight (lbs) 112 lb 10.5 oz 110 lb 10.7 oz 114 lb 10.2 oz  Weight (kg) 51.1 kg 50.2 kg 52 kg      Telemetry    Sinus rhythm.  No events - Personally Reviewed  ECG    n/a - Personally Reviewed  Physical Exam   VS:  BP (!) 149/90   Pulse 93   Temp 97.7 F (36.5 C) (Axillary)   Resp 17   Ht 5' 6"  (1.676 m)   Wt 51.1 kg   SpO2 96%   BMI 18.18 kg/m  , BMI Body mass index is 18.18 kg/m. GENERAL:  Cachectic, chronically ill-appearing woman in no acute distress.  Weak. HEENT: Pupils equal round and reactive, fundi not visualized, oral mucosa very dry.  Large tongue. NECK:  + jugular venous distention, waveform within normal limits, carotid upstroke brisk and  symmetric, no bruits LUNGS:  Clear on anterior exam HEART:  RRR.  PMI not displaced or sustained,S1 and S2 within normal limits, no S3, no S4, no clicks, no rubs, no murmurs ABD:  Flat, positive bowel sounds normal in frequency in pitch, no bruits.  +flank edema EXT:  2 plus pulses throughout, 1+ bilateral LE edema, no cyanosis no clubbing SKIN: Scleroderma-like appearance NEURO:  Cranial nerves II through XII grossly intact, motor grossly intact throughout PSYCH:  Cognitively intact, oriented to person place and time   Labs    High Sensitivity Troponin:   Recent Labs  Lab 07/03/19 1739 07/03/19 1930  TROPONINIHS 149* 163*      Chemistry Recent Labs  Lab 07/26/19 0530 07/27/19 0409 07/28/19 0418  NA 130* 128* 134*  K 5.8* 6.8* 5.3*  CL 91* 90* 92*  CO2 28 25 30     GLUCOSE 121* 133* 97  BUN 51* 81* 46*  CREATININE 2.77* 3.75* 2.83*  CALCIUM 8.3* 8.5* 8.4*  ALBUMIN 2.1* 2.3* 2.3*  GFRNONAA 17* 12* 16*  GFRAA 19* 13* 19*  ANIONGAP 11 13 12      Hematology Recent Labs  Lab 07/25/19 0454 07/27/19 0410  WBC 8.5 11.3*  RBC 3.15* 3.31*  HGB 9.0* 9.4*  HCT 30.6* 32.0*  MCV 97.1 96.7  MCH 28.6 28.4  MCHC 29.4* 29.4*  RDW 19.6* 19.8*  PLT 198 225    BNPNo results for input(s): BNP, PROBNP in the last 168 hours.   DDimer No results for input(s): DDIMER in the last 168 hours.   Radiology    DG Abd Portable 1V  Result Date: 07/27/2019 CLINICAL DATA:  Dysphagia. Preoperative for gastrostomy tube placement tomorrow EXAM: PORTABLE ABDOMEN - 1 VIEW COMPARISON:  None. FINDINGS: Weighted enteric tube tip is in the distal stomach. No dilated small bowel loops. Minimal colorectal stool. No evidence of pneumatosis or pneumoperitoneum. No radiopaque nephrolithiasis. IMPRESSION: 1. Weighted enteric tube tip is in the distal stomach. 2. Nonobstructive bowel gas pattern. Electronically Signed   By: Ilona Sorrel M.D.   On: 07/27/2019 12:52    Cardiac Studies   Echo 07/17/19: IMPRESSIONS   1. Left ventricular ejection fraction, by visual estimation, is 50 to 55%. The left ventricle has low normal function. There is mildly increased left ventricular hypertrophy.  2. Moderate hypokinesis of the left ventricular, entire septal wall.  3. The left ventricle demonstrates regional wall motion abnormalities.  4. Global right ventricle has normal systolic function.The right ventricular size is normal. No increase in right ventricular wall thickness.  5. Left atrial size was normal.  6. Right atrial size was mildly dilated.  7. Moderate pleural effusion in the left lateral region.  8. The pericardial effusion is circumferential.  9. Trivial pericardial effusion is present. 10. The mitral valve is abnormal. Mild mitral valve regurgitation. 11. The tricuspid valve  is grossly normal. Tricuspid valve regurgitation moderate. 12. Aortic valve regurgitation is moderate. 13. The aortic valve is tricuspid. Aortic valve regurgitation is moderate. Mild aortic valve sclerosis without stenosis. 14. The pulmonic valve was grossly normal. Pulmonic valve regurgitation is not visualized. 15. Moderately elevated pulmonary artery systolic pressure. 16. The inferior vena cava is dilated in size with >50% respiratory variability, suggesting right atrial pressure of 8 mmHg.  RHC/LHC 07/06/19: Findings:  RA = 7  RV = 42/7 PA = 48/17 (28) PCW = 16 LV = 122/8 Fick cardiac output/index = 5.0/3.2 PVR = 2.4 WU FA sat = 98% PA sat = 62%, 62%  Prominent y-descents in RA tracing but no square root sign in ventricular tracings and no RV/LV interdeprendence  After 350 cc NS bolus  RA = 9 RV = 46/9 PA = 48/24 (33) PCW = 23 LV = 125/15  Prominent y-descents in RA tracing but no square root sign in ventricular tracings and no RV/LV interdependence  Assessment: 1. Moderate pericardial effusion 2. Mild PAH 3. Normal filling pressures with no evidence of tamponade or constrictive physiology before/after fluid loading  Patient Profile     69 y.o. female with moderate AR, anasarca, mild hepatic steatosis admitted 12/14 with volume overload and tachycardia.  Hospitalization has been complicated by AKI now requiring HD and pericardial effusion s/p pericardial windown.  Assessment & Plan    # Anasarca: # Protein deficiency: Volume removal with HD.  Thought to be due to liver disease and poor oral intake with low protein.  However a definitive diagnosis has not been made.  She had an autoimmune work up that had elevated CRP with normal ESR.  She had negative RF, anti-Jo/Ro, ANCA, ANA, SPEP, and normal thyroid studies.  Anti-scl negative this admission.  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  Liver biopsy at [facility] showed mild hepatic steatosis but no  cirrhosis.  Negative EGD biopsy for amyloidosis. She was getting tube feeds with slow improvement in albumin.  The tube was removed due to severe discomfort.  She will go for G-tube today.  Palliative care was consulted 1/6 for goals of care discussion given lack of diagnosis and poor prognosis.  Hopefully they will be able to see her today.  For now, her goal is to go home and therefore they want to proceed with g-tube.  # Large pericardial effusion: S/p pericardial window.   No tamponade on LHC/RHC.  However, she underwent pericardial window due to worsening clinical course.  No renal improvement after the procedure.  Follow up echo 12/28 showed a trivial effusion.   # Moderate AR: Diuresis with HD as above.    # Hypertension: BP stable on carvedilol.  It was slightly high today.  Will not increase given that it is also low at times and she is on HD.  # AKI: Thought to be related to hypotension and IV contrast.  Autoimmune work up has been negative in the past.  No renal recovery.  Potassium was >6 today.  Patient seen in HD today.  # Dispo: Medically OK for CIR after g-tube placement. CIR consult placed.  For questions or updates, please contact [place] Please consult [email or web address] for contact info under        Signed, Skeet Latch, MD  07/28/2019, 8:26 AM

## 2019-07-28 NOTE — Plan of Care (Addendum)
  Problem: Clinical Measurements: Goal: Diagnostic test results will improve Outcome: Not Progressing Goal: Respiratory complications will improve Outcome: Not Progressing Goal: Cardiovascular complication will be avoided Outcome: Progressing   Problem: Activity: Goal: Risk for activity intolerance will decrease Outcome: Not Progressing   Problem: Nutrition: Goal: Adequate nutrition will be maintained Outcome: Not Progressing   Problem: Elimination: Goal: Will not experience complications related to urinary retention Outcome: Not Progressing   Problem: Pain Managment: Goal: General experience of comfort will improve Outcome: Not Progressing   Problem: Skin Integrity: Goal: Risk for impaired skin integrity will decrease Outcome: Not Progressing    Pt to IR for Gtube placement today. Pt declined dressing change today and refused turns or chair. O2 demand increased, pt states SOB at rest, up to 4LPM per [place].   Palliative consulted with patient and spouse. Q1hour CBG d/t continued drop in blood glucose; utilize d50. Stopped IVF, no TF (per IR) until 1/9. Pt up to HD for modified session two hour at 1845 for fluid overload. Report called to HD RN, aware of q1 CBG and continuous low sugars today, aware to treat with amp d50. Spouse at bedside.

## 2019-07-28 NOTE — Progress Notes (Addendum)
   Received page from RN about concern of patient looking worse today.   Patient had to be started on continuous D10 last night due to hypoglycemia for inadequate meal intake. NPO for Gtube placement. CBG 59 at 11:13. Received D50 25 mL with improvement.   At 12:30, patient was transported to IR and O2 sats dropped to 84 to 87% on 2L. O2 increased to 4 L PM.   Given IV fluids overnight and increased O2 need, will check chest x-ray when patient returns from IR.   OK to restart tube feeds when okay with IR. Will continue to monitor blood sugar closely in the meantime.   RN spoke with palliative care and they are going to see her today.   Darreld Mclean, PA-C 07/28/2019 1:13 PM  Update:  Chest x-ray showed diffuse severe bilateral pulmonary infiltrates/edema and moderate bilateral effusion suggestive of CHF.   Discontinue IV fluids.   Called and spoke with Dr. Johnney Ou (Nephrology) who is going to try to see if patient can go for a 2-hour dialysis session later today.  Palliative Care currently in with patient and husband.   Darreld Mclean, PA-C 07/28/2019 3:08 PM

## 2019-07-28 NOTE — Progress Notes (Signed)
Physical Therapy Treatment Patient Details Name: Joanna Ali MRN: [id number] DOB: [birth year] Today's Date: 07/28/2019    History of Present Illness Pt is a 69 year old woman admitted 07/03/19 with anasarca, fatigue and tachycardia. Hospital course complicated by AKI with pt now on HD and pericardial effusion s/p pericardial window. Gtube to be placed 07/28/19. PMH: HTN, former smoker and alcohol abuse, mild hepatic steatosis, chronic pleural effusion, aortic valve regurgitation    PT Comments    Patient progressing slowly towards PT goals. Pt with flat affect and appeared tired today however less anxious about movement. Requires Max A of 2 for bed mobility with little initiation/assist. Able to stand with Mod A of 2 and side step along side bed with min A of 2. Fatigues. Difficulty talking/communicating today. Encouraged AROM/exercise of BLEs - noted to have stiffness throughout LEs. Plan for gtube today. Will follow.   Follow Up Recommendations  CIR;Supervision for mobility/OOB;Supervision/Assistance - 24 hour     Equipment Recommendations  Rolling walker with 5" wheels    Recommendations for Other Services       Precautions / Restrictions Precautions Precautions: Fall Precaution Comments: gtube to be placed 1/8, nausea with movement Restrictions Weight Bearing Restrictions: No    Mobility  Bed Mobility Overal bed mobility: Needs Assistance Bed Mobility: Supine to Sit;Sit to Supine     Supine to sit: +2 for physical assistance;Total assist Sit to supine: +2 for physical assistance;Total assist   General bed mobility comments: assist for all aspects, pt less anxious about movement today, pointing to her belly indicating discomfort  Transfers Overall transfer level: Needs assistance Equipment used: 2 person hand held assist Transfers: Sit to/from Stand Sit to Stand: +2 physical assistance;Mod assist         General transfer comment: assist to rise using bed pad under  hips and gait belt. LEs supported on bed rail initially.  Ambulation/Gait Ambulation/Gait assistance: Min assist;+2 physical assistance Gait Distance (Feet): 4 Feet Assistive device: 2 person hand held assist Gait Pattern/deviations: Trunk flexed;Leaning posteriorly;Shuffle Gait velocity: decreased   General Gait Details: Able to take a few steps towards [facility] with assist for balance. Flexed trunk, neck and hips despite cues.   Stairs             Wheelchair Mobility    Modified Rankin (Stroke Patients Only)       Balance Overall balance assessment: Needs assistance Sitting-balance support: Feet supported;No upper extremity supported Sitting balance-Leahy Scale: Fair Sitting balance - Comments: improved posture today, sat x 10 minutes with min guard assist   Standing balance support: During functional activity;Bilateral upper extremity supported Standing balance-Leahy Scale: Poor Standing balance comment: mod assist of 2                            Cognition Arousal/Alertness: Awake/alert Behavior During Therapy: Flat affect Overall Cognitive Status: Difficult to assess                                 General Comments: pt talking minimally this visit, able to access her smart phone, aware she has a procedure today      Exercises      General Comments General comments (skin integrity, edema, etc.): Sp02 ranged in mid 80s-90s on 2L/min 02 [place].      Pertinent Vitals/Pain Pain Assessment: Faces Faces Pain Scale: Hurts little more Pain Location: abdomen  Pain Descriptors / Indicators: Discomfort;Grimacing Pain Intervention(s): Repositioned;Monitored during session    Home Living                      Prior Function            PT Goals (current goals can now be found in the care plan section) Acute Rehab PT Goals Patient Stated Goal: to get stronger and go home Progress towards PT goals: Progressing toward goals(slowly)     Frequency    Min 3X/week      PT Plan Current plan remains appropriate    Co-evaluation PT/OT/SLP Co-Evaluation/Treatment: Yes Reason for Co-Treatment: For patient/therapist safety PT goals addressed during session: Mobility/safety with mobility OT goals addressed during session: ADL's and self-care;Strengthening/ROM      AM-PAC PT "6 Clicks" Mobility   Outcome Measure  Help needed turning from your back to your side while in a flat bed without using bedrails?: A Lot Help needed moving from lying on your back to sitting on the side of a flat bed without using bedrails?: Total Help needed moving to and from a bed to a chair (including a wheelchair)?: Total Help needed standing up from a chair using your arms (e.g., wheelchair or bedside chair)?: A Lot Help needed to walk in hospital room?: Total Help needed climbing 3-5 steps with a railing? : Total 6 Click Score: 8    End of Session Equipment Utilized During Treatment: Gait belt Activity Tolerance: Patient tolerated treatment well Patient left: in bed;with call bell/phone within reach;with bed alarm set Nurse Communication: Mobility status PT Visit Diagnosis: Unsteadiness on feet (R26.81);Muscle weakness (generalized) (M62.81);Difficulty in walking, not elsewhere classified (R26.2)     Time: 0511-0211 PT Time Calculation (min) (ACUTE ONLY): 24 min  Charges:  $Therapeutic Activity: 8-22 mins                     Marisa Severin, PT, DPT Acute Rehabilitation Services Pager [phone] Office [phone]       [name] 07/28/2019, 12:02 PM

## 2019-07-28 NOTE — Progress Notes (Signed)
Inpatient Rehab Admissions:  Inpatient Rehab Consult received.  I met with patient and her spouse at the bedside for rehabilitation assessment and to discuss goals and expectations of an inpatient rehab admission.  They are both hopeful for CIR pending medical improvement.  Will f/u next week.   Signed: Shann Medal, PT, DPT Admissions Coordinator [phone] 07/28/19  12:15 PM

## 2019-07-28 NOTE — Progress Notes (Addendum)
Hypoglycemic Event  CBG: 66  Treatment: D50 25 mL (12.5 gm)  Symptoms: None  Follow-up CBG: Time:1800 CBG Result: 89  Possible Reasons for Event: Inadequate meal intake, holding TF, DC IVF. Planned HD    Pricilla Holm P

## 2019-07-28 NOTE — Progress Notes (Signed)
   Went by to see patient before leaving for the day. Patient resting comfortably in no acute distress. SPO2 100% on 4L. However, she does have bilateral crackles on lung exam. Spoke with Dr. Johnney Ou who confirmed they will do 2 hour dialysis session this evening. RN aware.   Feed tubes not scheduled to start until tomorrow. OK to use D50 amp as needed for hypoglycemia overnight. Currently checking CBGs every 2 hours.   I signed patient out to my colleague who is on call tonight.  Darreld Mclean, PA-C 07/28/2019 5:44 PM

## 2019-07-28 NOTE — Progress Notes (Signed)
[facility] Progress Note    Assessment/ Plan:   Pt is a 69 y.o. yo female  with history of HTN, former smoker, hepatic steatosis with low albumin, chronic pericardial effusion, moderate AR admitted on 12/17 for progressive dyspnea, anasarca and fatigue, seen as a consultationfor AKIand fluid overload.  #Acute kidney injury,nonoliguric (crt normal in Sept, was 1.43 on 07/03/19) : Likely hemodynamically mediated concomitant with use of IV contrast- U/A not remarkable.  Autoimmune work-up unremarkable in the past. No improvement despite Foley catheter.  Started HD 07/18/19 with IR placed [facility] for progressive uremia.  HD 12/31--> MS improved, HD #2 1/3, #3 1/5, #4 1/7, next planned 1/9.  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   #Hyperkalemia:  Has been having intermittent hyperK.  Try to avoid lokelma as it irritated throat.  RN was to d/w RD re: tube feeds to make sure low K  - now on nepro.  K 5.3 today. Check in AM.    #Hyponatremia, hypervolemic: improved with HD, CTM  #Anasarca presumably due to liver disease which was diagnosed at [facility]. She has a history of liver steatosis.No evidence of GN/nephrotic syndrome. 3rd spacing also.    #Moderate to large pericardial effusion:  s/p pericardial window on 12/23- TCTS following, stable  #Hypertension: Blood pressure acceptable. On low dose Coreg. . . #Anemia: s/p transfusion on 12/22.  gave feraheme and one dose of ESA on 12/23.  Hb 9.4.    #Hyperphosphatemia:  Improved with dialysis.  Phos in the 4s currently.    **Malnutrition:  G tube being pursued.  Dispo:  She can move to CIR and cont HD.   Subjective:    HD yesterday, tolerated ok. G tube being pursued. NG tube out and she's happier. Remains weak.   Objective:   BP (!) 149/90   Pulse 93   Temp 97.7 F (36.5 C) (Axillary)   Resp 17   Ht 5' 6"  (1.676 m)   Wt 51.1  kg   SpO2 96%   BMI 18.18 kg/m   Intake/Output Summary (Last 24 hours) at 07/28/2019 1037 Last data filed at 07/28/2019 0800 Gross per 24 hour  Intake 503.4 ml  Output 115 ml  Net 388.4 ml   Weight change: -1.8 kg  Physical Exam: Gen: cachectic, fatigued CVS: RRR, well-healing scars Resp: normal WOB Abd: thin, mildly painful to palpation Ext: + flank edema ACCESS: L TDC c/d/i  Imaging: DG Abd Portable 1V  Result Date: 07/27/2019 CLINICAL DATA:  Dysphagia. Preoperative for gastrostomy tube placement tomorrow EXAM: PORTABLE ABDOMEN - 1 VIEW COMPARISON:  None. FINDINGS: Weighted enteric tube tip is in the distal stomach. No dilated small bowel loops. Minimal colorectal stool. No evidence of pneumatosis or pneumoperitoneum. No radiopaque nephrolithiasis. IMPRESSION: 1. Weighted enteric tube tip is in the distal stomach. 2. Nonobstructive bowel gas pattern. Electronically Signed   By: Ilona Sorrel M.D.   On: 07/27/2019 12:52    Labs: BMET Recent Labs  Lab 07/23/19 0350 07/24/19 0408 07/25/19 0454 07/25/19 0925 07/26/19 0530 07/27/19 0409 07/28/19 0418  NA 127* 130* 131* 131* 130* 128* 134*  K 6.1* 5.1 6.3* 4.1 5.8* 6.8* 5.3*  CL 90* 90* 92* 93* 91* 90* 92*  CO2 26 28 26 28 28 25 30   GLUCOSE 112* 111* 115* 120* 121* 133* 97  BUN 87* 56* 85* 32* 51* 81* 46*  CREATININE 4.45* 3.17* 3.96* 1.81* 2.77* 3.75*  2.83*  CALCIUM 8.3* 8.2* 8.6* 7.6* 8.3* 8.5* 8.4*  PHOS 3.8 3.6 3.9 1.8* 3.5 4.6 4.3   CBC Recent Labs  Lab 07/25/19 0454 07/27/19 0410  WBC 8.5 11.3*  HGB 9.0* 9.4*  HCT 30.6* 32.0*  MCV 97.1 96.7  PLT 198 225    Medications:    . carvedilol  3.125 mg Oral BID WC  . Chlorhexidine Gluconate Cloth  6 each Topical Daily  . Chlorhexidine Gluconate Cloth  6 each Topical Q0600  . collagenase   Topical BID  . darbepoetin (ARANESP) injection - NON-DIALYSIS  100 mcg Subcutaneous Q Fri-1800  . diphenhydrAMINE  25 mg Oral Once  . feeding supplement (PRO-STAT SUGAR FREE  64)  30 mL Per Tube Daily  . gabapentin  100 mg Oral TID  . Gerhardt's butt cream   Topical TID  . heparin  5,000 Units Subcutaneous Q8H  . magic mouthwash w/lidocaine  5 mL Oral TID  . mouth rinse  15 mL Mouth Rinse BID  . neomycin-bacitracin-polymyxin   Topical BID  . nystatin  5 mL Mouth/Throat QID  . prochlorperazine  5 mg Oral Once  . sertraline  25 mg Oral BID  . sodium chloride flush  3 mL Intravenous Q12H  . sodium chloride flush  3 mL Intravenous Q12H      Madelon Lips MD [facility] pgr [phone] 07/28/2019, 10:37 AM

## 2019-07-29 LAB — GLUCOSE, CAPILLARY
Glucose-Capillary: 104 mg/dL — ABNORMAL HIGH (ref 70–99)
Glucose-Capillary: 108 mg/dL — ABNORMAL HIGH (ref 70–99)
Glucose-Capillary: 133 mg/dL — ABNORMAL HIGH (ref 70–99)
Glucose-Capillary: 64 mg/dL — ABNORMAL LOW (ref 70–99)
Glucose-Capillary: 64 mg/dL — ABNORMAL LOW (ref 70–99)
Glucose-Capillary: 72 mg/dL (ref 70–99)
Glucose-Capillary: 72 mg/dL (ref 70–99)
Glucose-Capillary: 75 mg/dL (ref 70–99)
Glucose-Capillary: 79 mg/dL (ref 70–99)
Glucose-Capillary: 87 mg/dL (ref 70–99)

## 2019-07-29 LAB — RENAL FUNCTION PANEL
Albumin: 2.3 g/dL — ABNORMAL LOW (ref 3.5–5.0)
Anion gap: 13 (ref 5–15)
BUN: 40 mg/dL — ABNORMAL HIGH (ref 8–23)
CO2: 28 mmol/L (ref 22–32)
Calcium: 8.4 mg/dL — ABNORMAL LOW (ref 8.9–10.3)
Chloride: 92 mmol/L — ABNORMAL LOW (ref 98–111)
Creatinine, Ser: 3.11 mg/dL — ABNORMAL HIGH (ref 0.44–1.00)
GFR calc Af Amer: 17 mL/min — ABNORMAL LOW (ref >60)
GFR calc non Af Amer: 14 mL/min — ABNORMAL LOW (ref >60)
Glucose, Bld: 88 mg/dL (ref 70–99)
Phosphorus: 4.8 mg/dL — ABNORMAL HIGH (ref 2.5–4.6)
Potassium: 4.8 mmol/L (ref 3.5–5.1)
Sodium: 133 mmol/L — ABNORMAL LOW (ref 135–145)

## 2019-07-29 MED ORDER — METHYLPREDNISOLONE SODIUM SUCC 125 MG IJ SOLR
80.0000 mg | Freq: Two times a day (BID) | INTRAMUSCULAR | Status: DC
  Administered 2019-07-29 – 2019-08-01 (×6): 80 mg via INTRAVENOUS
  Filled 2019-07-29 (×6): qty 2

## 2019-07-29 MED ORDER — JEVITY 1.2 CAL PO LIQD
1000.0000 mL | ORAL | Status: DC
  Filled 2019-07-29: qty 1000

## 2019-07-29 MED ORDER — DARBEPOETIN ALFA 100 MCG/0.5ML IJ SOSY
100.0000 ug | PREFILLED_SYRINGE | INTRAMUSCULAR | Status: DC
  Administered 2019-07-29 – 2019-08-05 (×2): 100 ug via SUBCUTANEOUS
  Filled 2019-07-29 (×3): qty 0.5

## 2019-07-29 MED ORDER — FLUCONAZOLE 200 MG PO TABS
200.0000 mg | ORAL_TABLET | Freq: Every day | ORAL | Status: AC
  Administered 2019-07-29 – 2019-08-11 (×14): 200 mg via ORAL
  Filled 2019-07-29 (×15): qty 1

## 2019-07-29 MED ORDER — NEPRO/CARBSTEADY PO LIQD
1000.0000 mL | ORAL | Status: DC
  Administered 2019-07-29 – 2019-08-08 (×8): 1000 mL
  Filled 2019-07-29 (×11): qty 1000

## 2019-07-29 NOTE — Progress Notes (Signed)
Progress Note  [name] Date of Encounter: 07/29/2019  Primary Cardiologist:   Evalina Field, MD   Subjective   We were called yesterday afternoon as she had decreased saturations.  She had dialysis.  O2 sats at 100% on RA  Inpatient Medications    Scheduled Meds: . carvedilol  3.125 mg Oral BID WC  . Chlorhexidine Gluconate Cloth  6 each Topical Daily  . Chlorhexidine Gluconate Cloth  6 each Topical Q0600  . collagenase   Topical BID  . darbepoetin (ARANESP) injection - NON-DIALYSIS  100 mcg Subcutaneous Q Fri-1800  . diphenhydrAMINE  25 mg Oral Once  . feeding supplement (PRO-STAT SUGAR FREE 64)  30 mL Per Tube Daily  . gabapentin  100 mg Oral TID  . Gerhardt's butt cream   Topical TID  . heparin  5,000 Units Subcutaneous Q8H  . magic mouthwash w/lidocaine  5 mL Oral TID  . mouth rinse  15 mL Mouth Rinse BID  . neomycin-bacitracin-polymyxin   Topical BID  . nystatin  5 mL Mouth/Throat QID  . prochlorperazine  5 mg Oral Once  . sertraline  25 mg Oral BID  . sodium chloride flush  3 mL Intravenous Q12H  . sodium chloride flush  3 mL Intravenous Q12H   Continuous Infusions: . sodium chloride 500 mL (07/06/19 1043)  . sodium chloride    . sodium chloride    . sodium chloride    . dextrose 40 mL/hr at 07/29/19 0258  . feeding supplement (NEPRO CARB STEADY)     PRN Meds: sodium chloride, sodium chloride, sodium chloride, sodium chloride, acetaminophen (TYLENOL) oral liquid 160 mg/5 mL, alteplase, alum & mag hydroxide-simeth, bisacodyl, bisacodyl, camphor-menthol, fentaNYL (SUBLIMAZE) injection, heparin, hydrOXYzine, lidocaine (PF), lidocaine-prilocaine, morphine injection, ondansetron (ZOFRAN) IV, oxyCODONE, pentafluoroprop-tetrafluoroeth, polyethylene glycol, polyvinyl alcohol, promethazine, senna-docusate, sodium chloride flush, traMADol, witch hazel-glycerin   Vital Signs    Vitals:   07/28/19 2147 07/28/19 2304 07/29/19 0322 07/29/19 0649  BP:  134/81 133/87 133/90 (!) 133/91  Pulse: 98 97 93 93  Resp: (!) 23 (!) 35 (!) 21   Temp: 99 F (37.2 C) (!) 97.5 F (36.4 C) 98 F (36.7 C)   TempSrc: Axillary Axillary Axillary   SpO2: 100% 100%    Weight:   50.7 kg   Height:        Intake/Output Summary (Last 24 hours) at 07/29/2019 0737 Last data filed at 07/29/2019 0300 Gross per 24 hour  Intake 553.06 ml  Output 1450 ml  Net -896.94 ml   Filed Weights   07/28/19 1855 07/28/19 2100 07/29/19 0322  Weight: 51.1 kg 49.7 kg 50.7 kg    Telemetry    NSR - Personally Reviewed  ECG    NA - Personally Reviewed  Physical Exam   GEN:    Chronically ill appearing and frail Neck: No  JVD Cardiac: RRR, no murmurs, rubs, or gallops.  Respiratory: Decreased breath sounds bilaterally GI: Soft, nontender, non-distended  MS:   Mild bilateral leg  edema; No deformity. Neuro:  Nonfocal  Psych: Normal affect   Labs    Chemistry Recent Labs  Lab 07/27/19 0409 07/28/19 0418 07/29/19 0348  NA 128* 134* 133*  K 6.8* 5.3* 4.8  CL 90* 92* 92*  CO2 25 30 28   GLUCOSE 133* 97 88  BUN 81* 46* 40*  CREATININE 3.75* 2.83* 3.11*  CALCIUM 8.5* 8.4* 8.4*  ALBUMIN 2.3* 2.3* 2.3*  GFRNONAA 12* 16* 14*  GFRAA 13* 19*  17*  ANIONGAP 13 12 13      Hematology Recent Labs  Lab 07/25/19 0454 07/27/19 0410  WBC 8.5 11.3*  RBC 3.15* 3.31*  HGB 9.0* 9.4*  HCT 30.6* 32.0*  MCV 97.1 96.7  MCH 28.6 28.4  MCHC 29.4* 29.4*  RDW 19.6* 19.8*  PLT 198 225    Cardiac EnzymesNo results for input(s): TROPONINI in the last 168 hours. No results for input(s): TROPIPOC in the last 168 hours.   BNPNo results for input(s): BNP, PROBNP in the last 168 hours.   DDimer No results for input(s): DDIMER in the last 168 hours.   Radiology    IR GASTROSTOMY TUBE MOD SED  Result Date: 07/28/2019 INDICATION: 69 year old with malnutrition and failure to thrive. Request for gastrostomy tube placement. EXAM: PERCUTANEOUS GASTROSTOMY TUBE WITH FLUOROSCOPIC  GUIDANCE Physician: Stephan Minister. Anselm Pancoast, MD MEDICATIONS: Ancef 2 g; Antibiotics were administered within 1 hour of the procedure. Glucagon 0.5 mg IV ANESTHESIA/SEDATION: Versed 2.0 mg IV; Fentanyl 100 mcg IV Moderate Sedation Time:  22 minutes The patient was continuously monitored during the procedure by the interventional radiology nurse under my direct supervision. FLUOROSCOPY TIME:  Fluoroscopy Time: 2 minutes, 48 seconds, 5 mGy COMPLICATIONS: None immediate. PROCEDURE: Informed consent was obtained for a percutaneous gastrostomy tube. The patient was placed on the interventional table. An orogastric tube was placed with fluoroscopic guidance. The anterior abdomen was prepped and draped in sterile fashion. Maximal barrier sterile technique was utilized including caps, mask, sterile gowns, sterile gloves, sterile drape, hand hygiene and skin antiseptic. Stomach was inflated with air through the orogastric tube. The skin and subcutaneous tissues were anesthetized with 1% lidocaine. A 17 gauge needle was directed into the distended stomach with fluoroscopic guidance. A wire was advanced into the stomach and a T-tact was deployed. A 9-French vascular sheath was placed and the orogastric tube was snared using a Gooseneck snare device. The orogastric tube and snare were pulled out of the patient's mouth. The snare device was connected to a 20-French gastrostomy tube. The snare device and gastrostomy tube were pulled through the patient's mouth and out the anterior abdominal wall. The gastrostomy tube was cut to an appropriate length. Contrast injection through gastrostomy tube confirmed placement within the stomach. Fluoroscopic images were obtained for documentation. The gastrostomy tube was flushed with normal saline. IMPRESSION: Successful fluoroscopic guided percutaneous gastrostomy tube placement. Electronically Signed   By: Markus Daft M.D.   On: 07/28/2019 13:59   DG CHEST PORT 1 VIEW  Result Date: 07/28/2019  CLINICAL DATA:  Shortness of breath.  Negative COVID test. EXAM: PORTABLE CHEST 1 VIEW COMPARISON:  07/18/2019. FINDINGS: Interim removal of feeding tube. Right IJ line and left dialysis catheter in stable position. Cardiomegaly. Diffuse severe bilateral pulmonary infiltrates/edema. Moderate bilateral pleural effusions. No pneumothorax. IMPRESSION: 1. Interim removal of feeding tube. Right IJ line and dialysis catheter stable position. 2. Cardiomegaly. Diffuse severe bilateral pulmonary infiltrates/edema. Moderate bilateral pleural effusions. Findings suggest CHF. Electronically Signed   By: Marcello Moores  Register   On: 07/28/2019 14:14   DG Abd Portable 1V  Result Date: 07/27/2019 CLINICAL DATA:  Dysphagia. Preoperative for gastrostomy tube placement tomorrow EXAM: PORTABLE ABDOMEN - 1 VIEW COMPARISON:  None. FINDINGS: Weighted enteric tube tip is in the distal stomach. No dilated small bowel loops. Minimal colorectal stool. No evidence of pneumatosis or pneumoperitoneum. No radiopaque nephrolithiasis. IMPRESSION: 1. Weighted enteric tube tip is in the distal stomach. 2. Nonobstructive bowel gas pattern. Electronically Signed   By: Rinaldo Ratel  Poff M.D.   On: 07/27/2019 12:52    Cardiac Studies   Echo 07/17/19: IMPRESSIONS  1. Left ventricular ejection fraction, by visual estimation, is 50 to 55%. The left ventricle has low normal function. There is mildly increased left ventricular hypertrophy. 2. Moderate hypokinesis of the left ventricular, entire septal wall. 3. The left ventricle demonstrates regional wall motion abnormalities. 4. Global right ventricle has normal systolic function.The right ventricular size is normal. No increase in right ventricular wall thickness. 5. Left atrial size was normal. 6. Right atrial size was mildly dilated. 7. Moderate pleural effusion in the left lateral region. 8. The pericardial effusion is circumferential. 9. Trivial pericardial effusion is present. 10.  The mitral valve is abnormal. Mild mitral valve regurgitation. 11. The tricuspid valve is grossly normal. Tricuspid valve regurgitation moderate. 12. Aortic valve regurgitation is moderate. 13. The aortic valve is tricuspid. Aortic valve regurgitation is moderate. Mild aortic valve sclerosis without stenosis. 14. The pulmonic valve was grossly normal. Pulmonic valve regurgitation is not visualized. 15. Moderately elevated pulmonary artery systolic pressure. 16. The inferior vena cava is dilated in size with >50% respiratory variability, suggesting right atrial pressure of 8 mmHg.  RHC/LHC 07/06/19: Findings:  RA = 7  RV = 42/7 PA = 48/17 (28) PCW = 16 LV = 122/8 Fick cardiac output/index = 5.0/3.2 PVR = 2.4 WU FA sat = 98% PA sat = 62%, 62%  Prominent y-descents in RA tracing but no square root sign in ventricular tracings and no RV/LV interdeprendence  After 350 cc NS bolus  RA = 9 RV = 46/9 PA = 48/24 (33) PCW = 23 LV = 125/15  Prominent y-descents in RA tracing but no square root sign in ventricular tracings and no RV/LV interdependence Assessment: 1. Moderate pericardial effusion 2. Mild PAH 3. Normal filling pressures with no evidence of tamponade or constrictive physiology before/after fluid loading  Patient Profile     69 y.o. female with moderate AR, anasarca, mild hepatic steatosis admitted 12/14 with volume overload and tachycardia.  Hospitalization has been complicated by AKI now requiring HD and pericardial effusion s/p pericardial window.  Assessment & Plan    ANASARACA:   Decreased O2 sats yesterday treated with 1400 cc removed at dialysis.  Back down from 4 to 2 liters O2.    PERICARDIAL EFFUSION:  Status post window.     MODERATE AI:    Medical management  HTN:  BP is labile.  No change in therapy.     AKI:  She had an extra dialysis session yesterday.  Much better oxygenation today.    For questions or updates, please contact [place]  Please consult [email or web address] for contact info under Cardiology/STEMI.   Signed, Minus Breeding, MD  07/29/2019, 7:37 AM

## 2019-07-29 NOTE — Progress Notes (Signed)
Pharmacy Antibiotic Note  Joanna Ali is a 69 y.o. female admitted on 07/03/2019 with Oropharangeal candidiasis.  Pharmacy has been consulted for fluconazole dosing.  Pt is newly initiated on HD with HD 12/31--> MS improved, HD #2 1/3, #3 1/5, #4 1/7, extra 1/8 for UF.  Plan: Fluconazole 200 mg q 24 hrs - give after HD on HD days.  Will need to watch renal function and adjust dose if needed.  Height: 5' 6"  (167.6 cm) Weight: 111 lb 12.4 oz (50.7 kg) IBW/kg (Calculated) : 59.3  Temp (24hrs), Avg:98 F (36.7 C), Min:97.1 F (36.2 C), Max:99 F (37.2 C)  Recent Labs  Lab 07/25/19 0454 07/25/19 0925 07/26/19 0530 07/27/19 0409 07/27/19 0410 07/28/19 0418 07/29/19 0348  WBC 8.5  --   --   --  11.3*  --   --   CREATININE 3.96* 1.81* 2.77* 3.75*  --  2.83* 3.11*    Estimated Creatinine Clearance: 13.5 mL/min (A) (by C-G formula based on SCr of 3.11 mg/dL (H)).    No Known Allergies   Thank you for allowing pharmacy to be a part of this patient's care.  Marguerite Olea, [facility] Clinical Pharmacist Phone [phone]  07/29/2019 3:01 PM

## 2019-07-29 NOTE — Plan of Care (Signed)
  Problem: Clinical Measurements: Goal: Ability to maintain clinical measurements within normal limits will improve Outcome: Progressing Goal: Respiratory complications will improve Outcome: Progressing Goal: Cardiovascular complication will be avoided Outcome: Progressing   Problem: Elimination: Goal: Will not experience complications related to bowel motility Outcome: Progressing   Problem: Pain Managment: Goal: General experience of comfort will improve Outcome: Progressing

## 2019-07-29 NOTE — Progress Notes (Cosign Needed)
Patient ID: Joanna Ali, female   DOB: 1950/07/28, 69 y.o.   MRN: [id number] Pt s/p G tube placement yesterday; now with some intermittent nausea; sore at G tube site as expected; mild dyspnea but 02 sats 100% RAL; G tube in place; site ok/mildy tender; OK to use tube for feeds; keep outer disc close to skin to prevent leakgae

## 2019-07-29 NOTE — Plan of Care (Signed)

## 2019-07-29 NOTE — Progress Notes (Addendum)
Palliative Medicine Inpatient Follow Up Note   HPI: 69 y.o. female  with past medical history of hypertension, moderate aortic regurgitation, chronic pericardial effusion, osteoarthritis, anasarca (secondary to liver disease?), macular degeneration admitted on 07/03/2019 with shortness of breath and tachycardia. Hospitalization complicated by acute kidney injury requiring dialysis. 12/15 bilateral thoracentesis and 12/23 pericardial window. PEG placed 1/8.   Yesterday team recommended starting fluconazole and steroids.  Today's Discussion (07/29/2019): Chart reviewed. Met with miss Galen and her husband, Joneen Caraway at bedside. Joneen Caraway shared that he had sent a message to the patients hepatologist regarding initiation of steroids but has not heard back yet. We talked about rheumatological processes and how they can cause inflammation. He still appears apprehensive about starting them so I suggested he speak to the primary hospital team regarding this.   Miss Hernandes shared her journey with me which has been quite difficult over the last few months. She shared that she would like to get stronger. We discussed the idea of her going to CIR which she is hopeful for once she is feeling better. We will continue to check in.  Discussed with patient the importance of continued conversation with family and their  medical providers regarding overall plan of care and treatment options, ensuring decisions are within the context of the patients values and GOCs.  Questions and concerns addressed   Subjective Assessment: Patient states that she is feeling alright. Complains of sacral discomfort while sitting up in bed. Encouraged to move around. Denies trouble breathing or chest pain.   Vital Signs Vitals:   07/29/19 0649 07/29/19 0743  BP: (!) 133/91 121/83  Pulse: 93 (!) 103  Resp:  (!) 23  Temp:  97.9 F (36.6 C)  SpO2:  100%    Intake/Output Summary (Last 24 hours) at 07/29/2019 1212 Last data filed  at 07/29/2019 0300 Gross per 24 hour  Intake 152.94 ml  Output 1400 ml  Net -1247.06 ml   Last Weight  Most recent update: 07/29/2019  3:39 AM   Weight  50.7 kg (111 lb 12.4 oz)           Gen:  Elderly frail F in NAD HEENT: dry mucous membranes CV: Regular rate and rhythm, no murmurs rubs or gallops PULM: clear to auscultation bilaterally  ABD: soft/nontender  EXT: Bilateral LE edema Neuro: Alert and oriented x3  Recommendations and Plan: - Recommendation to consult Hospitalist team for general medical management - Strongly recommend that a trial of steroid treatment should be pursued as discussed yesterday with team and Palliative attending physician, Dr. Hilma Favors  Symptom Management:   Recommend trial of Prednisone IV 25 mg daily for underlying inflamatory process of unknown etiology (0.5 mg/kg/day).   Also recommend fluconazole for possible esophageal thrush.  Discussed with Dr. Percival Spanish  Time In: 1200 Time Out: 1230 Time Spent: 30 Greater than 50% of the time was spent in counseling and coordination of care ______________________________________________________________________________________ [place] Team Team Cell Phone: [phone] Please utilize secure chat with additional questions, if there is no response within 30 minutes please call the above phone number  Palliative Medicine Team providers are available by phone from 7am to 7pm daily and can be reached through the team cell phone.  Should this patient require assistance outside of these hours, please call the patient's attending physician. _______________________________________________________________________________ Addendum: Discussed case with Dr. Hilma Favors  Esophageal Thrush:  - Started fluconazole per pharmacy  - Continue mycostatin  Underlying Inflammatory process of unclear origin:  -  Trial of steroids -->  gauge clinical improvement daily

## 2019-07-29 NOTE — Progress Notes (Signed)
[facility] Progress Note    Assessment/ Plan:   Pt is a 69 y.o. yo female  with history of HTN, former smoker, hepatic steatosis with low albumin, chronic pericardial effusion, moderate AR admitted on 12/17 for progressive dyspnea, anasarca and fatigue, seen as a consultationfor AKIand fluid overload.  #Acute kidney injury,nonoliguric (crt normal in Sept, was 1.43 on 07/03/19) : Likely hemodynamically mediated concomitant with use of IV contrast- U/A not remarkable.  Autoimmune work-up unremarkable in the past. No improvement despite Foley catheter.  Started HD 07/18/19 with IR placed [facility] for progressive uremia.  HD 12/31--> MS improved, HD #2 1/3, #3 1/5, #4 1/7, extra 1/8 for UF.  Will hold HD today with improved pulmonary status and labs; she much prefers to hold due to fatigue.  Will assess for need tomorrow (Sunday).  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  From a renal perspective I think empiric trial of steroids is fine if other specialists think it would be helpful, but there has been no evidence for GN and I don't anticipate any direct benefit or toxicity to the kidneys.  #Hyperkalemia:  Has been having intermittent hyperK.  Try to avoid lokelma as it irritated throat.  RN was to d/w RD re: tube feeds to make sure low K  - now on nepro.  K normal today.   #Anasarca presumably due to liver disease which was diagnosed at [facility]. She has a history of liver steatosis.No evidence of GN/nephrotic syndrome. 3rd spacing also.    #Moderate to large pericardial effusion:  s/p pericardial window on 12/23- TCTS following, stable  #Hypertension: Blood pressure acceptable. On low dose Coreg. . . #Anemia: s/p transfusion on 12/22.  gave feraheme and one dose of ESA on 12/23.  Hb 9.4 on 1/7.    #Hyperphosphatemia:  Improved with dialysis.  Phos in the 4s currently.     **Malnutrition:  S/p G tube 1/8 which should be useable today.    #Hypoglycemia: recurrentl, likely liver dz + NPO status --> requiring dextrose frequently in past 2 day, with resumption of TF should improve.  Dispo:  She can move to CIR and cont HD.  Palliative care is involved in [place] discussions.    Subjective:    Required extra HD yesterday PM for fluid removal -- in setting of hypoglycemia + D5W gtt she had pulmonary edema, inc O2 requirement.  UF 1.4L.  Recurrent hypoglycemia while tube feeds off.  Will start using G tube today.   Objective:   BP 121/83 (BP Location: Left Arm)   Pulse (!) 103   Temp 97.9 F (36.6 C) (Oral)   Resp (!) 23   Ht 5' 6"  (1.676 m)   Wt 50.7 kg   SpO2 100%   BMI 18.04 kg/m   Intake/Output Summary (Last 24 hours) at 07/29/2019 0845 Last data filed at 07/29/2019 0300 Gross per 24 hour  Intake 353 ml  Output 1450 ml  Net -1097 ml   Weight change: 0.9 kg  Physical Exam: Gen: cachectic, fatigued CVS: RRR, well-healing scars Resp: normal WOB Abd: thin, mildly painful to palpation Ext: + flank edema ACCESS: L TDC c/d/i  Imaging: IR GASTROSTOMY TUBE MOD SED  Result Date: 07/28/2019 INDICATION: 69 year old with malnutrition and failure to thrive. Request for gastrostomy tube placement. EXAM: PERCUTANEOUS GASTROSTOMY TUBE WITH FLUOROSCOPIC GUIDANCE Physician: Stephan Minister. Anselm Pancoast, MD MEDICATIONS: Ancef 2 g; Antibiotics were  administered within 1 hour of the procedure. Glucagon 0.5 mg IV ANESTHESIA/SEDATION: Versed 2.0 mg IV; Fentanyl 100 mcg IV Moderate Sedation Time:  22 minutes The patient was continuously monitored during the procedure by the interventional radiology nurse under my direct supervision. FLUOROSCOPY TIME:  Fluoroscopy Time: 2 minutes, 48 seconds, 5 mGy COMPLICATIONS: None immediate. PROCEDURE: Informed consent was obtained for a percutaneous gastrostomy tube. The patient was placed on the interventional table. An orogastric tube was placed with  fluoroscopic guidance. The anterior abdomen was prepped and draped in sterile fashion. Maximal barrier sterile technique was utilized including caps, mask, sterile gowns, sterile gloves, sterile drape, hand hygiene and skin antiseptic. Stomach was inflated with air through the orogastric tube. The skin and subcutaneous tissues were anesthetized with 1% lidocaine. A 17 gauge needle was directed into the distended stomach with fluoroscopic guidance. A wire was advanced into the stomach and a T-tact was deployed. A 9-French vascular sheath was placed and the orogastric tube was snared using a [place]os[place]ck snare device. The orogastric tube and snare were pulled out of the patient's mouth. The snare device was connected to a 20-French gastrostomy tube. The snare device and gastrostomy tube were pulled through the patient's mouth and out the anterior abdominal wall. The gastrostomy tube was cut to an appropriate length. Contrast injection through gastrostomy tube confirmed placement within the stomach. Fluoroscopic images were obtained for documentation. The gastrostomy tube was flushed with normal saline. IMPRESSION: Successful fluoroscopic guided percutaneous gastrostomy tube placement. Electronically Signed   By: Markus Daft M.D.   On: 07/28/2019 13:59   DG CHEST PORT 1 VIEW  Result Date: 07/28/2019 CLINICAL DATA:  Shortness of breath.  Negative COVID test. EXAM: PORTABLE CHEST 1 VIEW COMPARISON:  07/18/2019. FINDINGS: Interim removal of feeding tube. Right IJ line and left dialysis catheter in stable position. Cardiomegaly. Diffuse severe bilateral pulmonary infiltrates/edema. Moderate bilateral pleural effusions. No pneumothorax. IMPRESSION: 1. Interim removal of feeding tube. Right IJ line and dialysis catheter stable position. 2. Cardiomegaly. Diffuse severe bilateral pulmonary infiltrates/edema. Moderate bilateral pleural effusions. Findings suggest CHF. Electronically Signed   By: Marcello Moores  Register   On: 07/28/2019  14:14   DG Abd Portable 1V  Result Date: 07/27/2019 CLINICAL DATA:  Dysphagia. Preoperative for gastrostomy tube placement tomorrow EXAM: PORTABLE ABDOMEN - 1 VIEW COMPARISON:  None. FINDINGS: Weighted enteric tube tip is in the distal stomach. No dilated small bowel loops. Minimal colorectal stool. No evidence of pneumatosis or pneumoperitoneum. No radiopaque nephrolithiasis. IMPRESSION: 1. Weighted enteric tube tip is in the distal stomach. 2. Nonobstructive bowel gas pattern. Electronically Signed   By: Ilona Sorrel M.D.   On: 07/27/2019 12:52    Labs: BMET Recent Labs  Lab 07/24/19 0408 07/25/19 0454 07/25/19 0925 07/26/19 0530 07/27/19 0409 07/28/19 0418 07/29/19 0348  NA 130* 131* 131* 130* 128* 134* 133*  K 5.1 6.3* 4.1 5.8* 6.8* 5.3* 4.8  CL 90* 92* 93* 91* 90* 92* 92*  CO2 28 26 28 28 25 30 28   GLUCOSE 111* 115* 120* 121* 133* 97 88  BUN 56* 85* 32* 51* 81* 46* 40*  CREATININE 3.17* 3.96* 1.81* 2.77* 3.75* 2.83* 3.11*  CALCIUM 8.2* 8.6* 7.6* 8.3* 8.5* 8.4* 8.4*  PHOS 3.6 3.9 1.8* 3.5 4.6 4.3 4.8*   CBC Recent Labs  Lab 07/25/19 0454 07/27/19 0410  WBC 8.5 11.3*  HGB 9.0* 9.4*  HCT 30.6* 32.0*  MCV 97.1 96.7  PLT 198 225    Medications:    . carvedilol  3.125 mg Oral BID WC  . Chlorhexidine Gluconate Cloth  6 each Topical Daily  . Chlorhexidine Gluconate Cloth  6 each Topical Q0600  . collagenase   Topical BID  . darbepoetin (ARANESP) injection - NON-DIALYSIS  100 mcg Subcutaneous Q Fri-1800  . diphenhydrAMINE  25 mg Oral Once  . feeding supplement (PRO-STAT SUGAR FREE 64)  30 mL Per Tube Daily  . gabapentin  100 mg Oral TID  . Gerhardt's butt cream   Topical TID  . heparin  5,000 Units Subcutaneous Q8H  . magic mouthwash w/lidocaine  5 mL Oral TID  . mouth rinse  15 mL Mouth Rinse BID  . neomycin-bacitracin-polymyxin   Topical BID  . nystatin  5 mL Mouth/Throat QID  . prochlorperazine  5 mg Oral Once  . sertraline  25 mg Oral BID  . sodium chloride  flush  3 mL Intravenous Q12H  . sodium chloride flush  3 mL Intravenous Q12H      Madelon Lips MD [facility] pgr [phone] 07/29/2019, 8:45 AM

## 2019-07-30 DIAGNOSIS — M6281 Muscle weakness (generalized): Secondary | ICD-10-CM | POA: Diagnosis present

## 2019-07-30 DIAGNOSIS — R1310 Dysphagia, unspecified: Secondary | ICD-10-CM

## 2019-07-30 DIAGNOSIS — B370 Candidal stomatitis: Secondary | ICD-10-CM | POA: Diagnosis present

## 2019-07-30 LAB — CBC
HCT: 31.2 % — ABNORMAL LOW (ref 36.0–46.0)
Hemoglobin: 9.3 g/dL — ABNORMAL LOW (ref 12.0–15.0)
MCH: 28.4 pg (ref 26.0–34.0)
MCHC: 29.8 g/dL — ABNORMAL LOW (ref 30.0–36.0)
MCV: 95.4 fL (ref 80.0–100.0)
Platelets: 230 10*3/uL (ref 150–400)
RBC: 3.27 MIL/uL — ABNORMAL LOW (ref 3.87–5.11)
RDW: 18.7 % — ABNORMAL HIGH (ref 11.5–15.5)
WBC: 4.5 10*3/uL (ref 4.0–10.5)
nRBC: 0 % (ref 0.0–0.2)

## 2019-07-30 LAB — GLUCOSE, CAPILLARY
Glucose-Capillary: 117 mg/dL — ABNORMAL HIGH (ref 70–99)
Glucose-Capillary: 120 mg/dL — ABNORMAL HIGH (ref 70–99)
Glucose-Capillary: 123 mg/dL — ABNORMAL HIGH (ref 70–99)
Glucose-Capillary: 124 mg/dL — ABNORMAL HIGH (ref 70–99)
Glucose-Capillary: 131 mg/dL — ABNORMAL HIGH (ref 70–99)
Glucose-Capillary: 140 mg/dL — ABNORMAL HIGH (ref 70–99)

## 2019-07-30 LAB — RENAL FUNCTION PANEL
Albumin: 2.3 g/dL — ABNORMAL LOW (ref 3.5–5.0)
Anion gap: 11 (ref 5–15)
BUN: 67 mg/dL — ABNORMAL HIGH (ref 8–23)
CO2: 27 mmol/L (ref 22–32)
Calcium: 8.7 mg/dL — ABNORMAL LOW (ref 8.9–10.3)
Chloride: 92 mmol/L — ABNORMAL LOW (ref 98–111)
Creatinine, Ser: 4.15 mg/dL — ABNORMAL HIGH (ref 0.44–1.00)
GFR calc Af Amer: 12 mL/min — ABNORMAL LOW (ref >60)
GFR calc non Af Amer: 10 mL/min — ABNORMAL LOW (ref >60)
Glucose, Bld: 140 mg/dL — ABNORMAL HIGH (ref 70–99)
Phosphorus: 7.4 mg/dL — ABNORMAL HIGH (ref 2.5–4.6)
Potassium: 5.8 mmol/L — ABNORMAL HIGH (ref 3.5–5.1)
Sodium: 130 mmol/L — ABNORMAL LOW (ref 135–145)

## 2019-07-30 MED ORDER — SODIUM ZIRCONIUM CYCLOSILICATE 10 G PO PACK
10.0000 g | PACK | Freq: Once | ORAL | Status: DC
  Filled 2019-07-30: qty 1

## 2019-07-30 MED ORDER — FAMOTIDINE 20 MG PO TABS
40.0000 mg | ORAL_TABLET | Freq: Two times a day (BID) | ORAL | Status: DC
  Administered 2019-07-30 – 2019-07-31 (×2): 40 mg via ORAL
  Filled 2019-07-30 (×2): qty 2

## 2019-07-30 MED ORDER — PATIROMER SORBITEX CALCIUM 8.4 G PO PACK
8.4000 g | PACK | Freq: Once | ORAL | Status: AC
  Administered 2019-07-30: 8.4 g via ORAL
  Filled 2019-07-30: qty 1

## 2019-07-30 MED ORDER — LORATADINE 10 MG PO TABS
10.0000 mg | ORAL_TABLET | Freq: Every day | ORAL | Status: DC
  Administered 2019-07-30 – 2019-08-23 (×25): 10 mg via ORAL
  Filled 2019-07-30 (×25): qty 1

## 2019-07-30 NOTE — Progress Notes (Addendum)
Patient called, extremely anxious c/o burning sensation to the throat. Stated " I`m having a reaction" referring to [facility] that was administered 30 min ago via peg tube. Patient stated that its the same kind of discomfort she had with with [facility].  Patient and husband reassured. Morphine PRN. administered. Patient resting comfortably thereafter. I will continue to monitor.

## 2019-07-30 NOTE — Plan of Care (Signed)
  Problem: Clinical Measurements: Goal: Ability to maintain clinical measurements within normal limits will improve Outcome: Progressing Goal: Will remain free from infection Outcome: Progressing Goal: Respiratory complications will improve Outcome: Progressing Goal: Cardiovascular complication will be avoided Outcome: Progressing

## 2019-07-30 NOTE — Progress Notes (Addendum)
  Palliative Medicine Inpatient Follow Up Note  HPI: 69 y.o.femalewith past medical history of hypertension, moderate aortic regurgitation, chronic pericardial effusion, osteoarthritis, anasarca (secondary to liver disease?), macular degenerationadmitted on 12/14/2020with shortness of breath and tachycardia.Hospitalization complicated by acute kidney injury requiring dialysis. 12/15 bilateral thoracentesis and 12/23 pericardial window.PEGplaced 1/8.  Yesterday (07/29/19) team started fluconazole and steroids.  Today's Discussion (07/30/2019): Chart reviewed. Met with miss Sterry and her husband, Joneen Caraway at bedside. We talked about how she was feeling. They both felt that the patient had made some major improvements from yesterday to today. They both stated feeling ready for her to go to CIR.  We talked about having introduced steroids, both miss Christmas and her husband vocalized having spoke to their hepatologist and primary provider who also felt these were a good idea. He stated that there was more peace of mind with them having added their thoughts. Shared with them that my attending also felt strongly and we were seeing some benefit already. We discussed making sure there was good follow up on discharge with a rheumatologist and neurologist. They stated that appointments were already being arranged.  Discussed with patient the importance of continued conversation with family and their  medical providers regarding overall plan of care and treatment options, ensuring decisions are within the context of the patients values and GOCs.  Questions and concerns addressed   Subjective Assessment: Patient states that she is feeling better. Said her throat pain is improved to the point where it just feels like a sore throat. Shares that she was able to tolerate some croissant and havarti cheese today. Denies chest pain or shortness of breath   Vitals:   07/30/19 0800 07/30/19 1129  BP: (!) 142/94  (!) 158/93  Pulse: 91 88  Resp: (!) 25 (!) 26  Temp:  97.9 F (36.6 C)  SpO2: 100% 100%   Gen:  Elderly frail F in NAD HEENT: dry mucous membranes CV: Regular rate and rhythm, no murmurs rubs or gallops PULM: clear to auscultation bilaterally  ABD: soft/nontender  EXT: Bilateral LE trace edema Neuro: Alert and oriented x4  Recommendations and Plan: - Palliative will continue to follow upon transition to [place] consult, as understand they will follow upon transition to rehabilitation  Symptom Management: Generalized Weakness:  - Plan for transition to the acute rehab unit  - Encourage OOB daily  - Mobilize to restroom  Dysphagia, likely in the setting of severe candidal infection:  - Encourage satiable foods  - Swallow evaluation  Esophageal Thrush:   - Started fluconazole per pharmacy    - Continue mycostatin  - Ensure good oral hygiene  Underlying Inflammatory process of unclear origin:       - Trial of steroids -->  gauge clinical improvement daily  - Will communicate with team about pursuing additional workup  - OP Rheumatology follow up  Discussed with Dr. Percival Spanish  Time In: 11:45 Time Out: 1210 Time Spent: 25 Greater than 50% of the time was spent in counseling and coordination of care ______________________________________________________________________________________ [place] Team Team Cell Phone: [phone] Please utilize secure chat with additional questions, if there is no response within 30 minutes please call the above phone number  Palliative Medicine Team providers are available by phone from 7am to 7pm daily and can be reached through the team cell phone.  Should this patient require assistance outside of these hours, please call the patient's attending physician.

## 2019-07-30 NOTE — Progress Notes (Signed)
Patient ready for CIR per Dr Percival Spanish. I have called Inpatient Rehab, admission coordinator not in today until Monday January 11th .

## 2019-07-30 NOTE — Progress Notes (Signed)
Progress Note  [name] Date of Encounter: 07/30/2019  Primary Cardiologist:   Evalina Field, MD   Subjective   She is much more awake today.  Not complaining of the oral discomfort.  She started steroids and G tube feedings yesterday.  She was up to the bedside commode with one assist this morning.  Breathing OK  Inpatient Medications    Scheduled Meds: . carvedilol  3.125 mg Oral BID WC  . Chlorhexidine Gluconate Cloth  6 each Topical Daily  . Chlorhexidine Gluconate Cloth  6 each Topical Q0600  . collagenase   Topical BID  . darbepoetin (ARANESP) injection - NON-DIALYSIS  100 mcg Subcutaneous Q Sat-1800  . diphenhydrAMINE  25 mg Oral Once  . feeding supplement (NEPRO CARB STEADY)  1,000 mL Per Tube Q24H  . feeding supplement (PRO-STAT SUGAR FREE 64)  30 mL Per Tube Daily  . fluconazole  200 mg Oral Daily  . gabapentin  100 mg Oral TID  . Gerhardt's butt cream   Topical TID  . heparin  5,000 Units Subcutaneous Q8H  . magic mouthwash w/lidocaine  5 mL Oral TID  . mouth rinse  15 mL Mouth Rinse BID  . methylPREDNISolone (SOLU-MEDROL) injection  80 mg Intravenous Q12H  . neomycin-bacitracin-polymyxin   Topical BID  . nystatin  5 mL Mouth/Throat QID  . prochlorperazine  5 mg Oral Once  . sertraline  25 mg Oral BID  . sodium chloride flush  3 mL Intravenous Q12H  . sodium chloride flush  3 mL Intravenous Q12H   Continuous Infusions: . sodium chloride 500 mL (07/06/19 1043)  . sodium chloride    . sodium chloride    . sodium chloride    . dextrose Stopped (07/29/19 1707)   PRN Meds: sodium chloride, sodium chloride, sodium chloride, sodium chloride, acetaminophen (TYLENOL) oral liquid 160 mg/5 mL, alteplase, alum & mag hydroxide-simeth, bisacodyl, bisacodyl, camphor-menthol, fentaNYL (SUBLIMAZE) injection, heparin, hydrOXYzine, lidocaine (PF), lidocaine-prilocaine, morphine injection, ondansetron (ZOFRAN) IV, oxyCODONE, pentafluoroprop-tetrafluoroeth,  polyethylene glycol, polyvinyl alcohol, promethazine, senna-docusate, sodium chloride flush, traMADol, witch hazel-glycerin   Vital Signs    Vitals:   07/29/19 1629 07/29/19 1938 07/29/19 2351 07/30/19 0230  BP: (!) 143/99 (!) 142/90 134/82 (!) 133/96  Pulse: (!) 111 97 99 95  Resp:  19 (!) 24 (!) 21  Temp:  (!) 97.5 F (36.4 C) 98.3 F (36.8 C) 97.7 F (36.5 C)  TempSrc:  Oral Axillary Oral  SpO2:  100% 100% 100%  Weight:    52.1 kg  Height:        Intake/Output Summary (Last 24 hours) at 07/30/2019 0737 Last data filed at 07/29/2019 1940 Gross per 24 hour  Intake 711.33 ml  Output 75 ml  Net 636.33 ml   Filed Weights   07/28/19 2100 07/29/19 0322 07/30/19 0230  Weight: 49.7 kg 50.7 kg 52.1 kg    Telemetry    NSR - Personally Reviewed  ECG    NA - Personally Reviewed  Physical Exam   GEN: No  acute distress.   Neck: No  JVD Cardiac: RRR, no murmurs, rubs, or gallops.  Respiratory:   Decreased breath sounds GI: Soft, nontender, non-distended, normal bowel sounds  MS:  Mild edema; No deformity. Neuro:   Nonfocal  Psych: Oriented and appropriate    Labs    Chemistry Recent Labs  Lab 07/28/19 0418 07/29/19 0348 07/30/19 0500  NA 134* 133* 130*  K 5.3* 4.8 5.8*  CL 92* 92*  92*  CO2 30 28 27   GLUCOSE 97 88 140*  BUN 46* 40* 67*  CREATININE 2.83* 3.11* 4.15*  CALCIUM 8.4* 8.4* 8.7*  ALBUMIN 2.3* 2.3* 2.3*  GFRNONAA 16* 14* 10*  GFRAA 19* 17* 12*  ANIONGAP 12 13 11      Hematology Recent Labs  Lab 07/25/19 0454 07/27/19 0410 07/30/19 0500  WBC 8.5 11.3* 4.5  RBC 3.15* 3.31* 3.27*  HGB 9.0* 9.4* 9.3*  HCT 30.6* 32.0* 31.2*  MCV 97.1 96.7 95.4  MCH 28.6 28.4 28.4  MCHC 29.4* 29.4* 29.8*  RDW 19.6* 19.8* 18.7*  PLT 198 225 230    Cardiac EnzymesNo results for input(s): TROPONINI in the last 168 hours. No results for input(s): TROPIPOC in the last 168 hours.   BNPNo results for input(s): BNP, PROBNP in the last 168 hours.   DDimer No  results for input(s): DDIMER in the last 168 hours.   Radiology    IR GASTROSTOMY TUBE MOD SED  Result Date: 07/28/2019 INDICATION: 69 year old with malnutrition and failure to thrive. Request for gastrostomy tube placement. EXAM: PERCUTANEOUS GASTROSTOMY TUBE WITH FLUOROSCOPIC GUIDANCE Physician: Stephan Minister. Anselm Pancoast, MD MEDICATIONS: Ancef 2 g; Antibiotics were administered within 1 hour of the procedure. Glucagon 0.5 mg IV ANESTHESIA/SEDATION: Versed 2.0 mg IV; Fentanyl 100 mcg IV Moderate Sedation Time:  22 minutes The patient was continuously monitored during the procedure by the interventional radiology nurse under my direct supervision. FLUOROSCOPY TIME:  Fluoroscopy Time: 2 minutes, 48 seconds, 5 mGy COMPLICATIONS: None immediate. PROCEDURE: Informed consent was obtained for a percutaneous gastrostomy tube. The patient was placed on the interventional table. An orogastric tube was placed with fluoroscopic guidance. The anterior abdomen was prepped and draped in sterile fashion. Maximal barrier sterile technique was utilized including caps, mask, sterile gowns, sterile gloves, sterile drape, hand hygiene and skin antiseptic. Stomach was inflated with air through the orogastric tube. The skin and subcutaneous tissues were anesthetized with 1% lidocaine. A 17 gauge needle was directed into the distended stomach with fluoroscopic guidance. A wire was advanced into the stomach and a T-tact was deployed. A 9-French vascular sheath was placed and the orogastric tube was snared using a Gooseneck snare device. The orogastric tube and snare were pulled out of the patient's mouth. The snare device was connected to a 20-French gastrostomy tube. The snare device and gastrostomy tube were pulled through the patient's mouth and out the anterior abdominal wall. The gastrostomy tube was cut to an appropriate length. Contrast injection through gastrostomy tube confirmed placement within the stomach. Fluoroscopic images were  obtained for documentation. The gastrostomy tube was flushed with normal saline. IMPRESSION: Successful fluoroscopic guided percutaneous gastrostomy tube placement. Electronically Signed   By: Markus Daft M.D.   On: 07/28/2019 13:59   DG CHEST PORT 1 VIEW  Result Date: 07/28/2019 CLINICAL DATA:  Shortness of breath.  Negative COVID test. EXAM: PORTABLE CHEST 1 VIEW COMPARISON:  07/18/2019. FINDINGS: Interim removal of feeding tube. Right IJ line and left dialysis catheter in stable position. Cardiomegaly. Diffuse severe bilateral pulmonary infiltrates/edema. Moderate bilateral pleural effusions. No pneumothorax. IMPRESSION: 1. Interim removal of feeding tube. Right IJ line and dialysis catheter stable position. 2. Cardiomegaly. Diffuse severe bilateral pulmonary infiltrates/edema. Moderate bilateral pleural effusions. Findings suggest CHF. Electronically Signed   By: Marcello Moores  Register   On: 07/28/2019 14:14    Cardiac Studies   Echo 07/17/19: IMPRESSIONS  1. Left ventricular ejection fraction, by visual estimation, is 50 to 55%. The left ventricle has low  normal function. There is mildly increased left ventricular hypertrophy. 2. Moderate hypokinesis of the left ventricular, entire septal wall. 3. The left ventricle demonstrates regional wall motion abnormalities. 4. Global right ventricle has normal systolic function.The right ventricular size is normal. No increase in right ventricular wall thickness. 5. Left atrial size was normal. 6. Right atrial size was mildly dilated. 7. Moderate pleural effusion in the left lateral region. 8. The pericardial effusion is circumferential. 9. Trivial pericardial effusion is present. 10. The mitral valve is abnormal. Mild mitral valve regurgitation. 11. The tricuspid valve is grossly normal. Tricuspid valve regurgitation moderate. 12. Aortic valve regurgitation is moderate. 13. The aortic valve is tricuspid. Aortic valve regurgitation is moderate.  Mild aortic valve sclerosis without stenosis. 14. The pulmonic valve was grossly normal. Pulmonic valve regurgitation is not visualized. 15. Moderately elevated pulmonary artery systolic pressure. 16. The inferior vena cava is dilated in size with >50% respiratory variability, suggesting right atrial pressure of 8 mmHg.  RHC/LHC 07/06/19: Findings:  RA = 7  RV = 42/7 PA = 48/17 (28) PCW = 16 LV = 122/8 Fick cardiac output/index = 5.0/3.2 PVR = 2.4 WU FA sat = 98% PA sat = 62%, 62%  Prominent y-descents in RA tracing but no square root sign in ventricular tracings and no RV/LV interdeprendence  After 350 cc NS bolus  RA = 9 RV = 46/9 PA = 48/24 (33) PCW = 23 LV = 125/15  Prominent y-descents in RA tracing but no square root sign in ventricular tracings and no RV/LV interdependence Assessment: 1. Moderate pericardial effusion 2. Mild PAH 3. Normal filling pressures with no evidence of tamponade or constrictive physiology before/after fluid loading  Patient Profile     69 y.o. female with moderate AR, anasarca, mild hepatic steatosis admitted 12/14 with volume overload and tachycardia.  Hospitalization has been complicated by AKI now requiring HD and pericardial effusion s/p pericardial window.  Assessment & Plan    ANASARACA:   Protein defficiency.  Started steroids per renal yesterday.  Volume being managed through dialysis. Marland Kitchen    PERICARDIAL EFFUSION:  Status post window.  No suggestion that this is a further problem  MODERATE AI:    Medical management.     HTN:  BP is labile.  No change in therapy.     MALNUTRITION:    Started nutrition.    AKI:  Potassium is up today.  Nephrology seeing today and likely will get dialysis.    She still has a Foley in and minimal urine out.  I will defer to nephrology but this could likely be discontinued.    DISPOSITION:  The eventual goal is CIR.  Medical problems are stable.  She is oxygenating 100% on RA.  She does still  have pleural effusions by exam but this can be managed with dialysis and nutrition and hopefully will resolve.  She should feel better on the steroids and I will defer the plans for tapering to her nephrologist.  I think she is medically ready for transfer to rehab.    For questions or updates, please contact [place] Please consult [email or web address] for contact info under Cardiology/STEMI.   Signed, Minus Breeding, MD  07/30/2019, 7:37 AM

## 2019-07-30 NOTE — Progress Notes (Signed)
[facility] Progress Note    Assessment/ Plan:   Pt is a 69 y.o. yo female  with history of HTN, former smoker, hepatic steatosis with low albumin, chronic pericardial effusion, moderate AR admitted on 12/17 for progressive dyspnea, anasarca and fatigue, seen as a consultationfor AKIand fluid overload.  #Acute kidney injury,nonoliguric (crt normal in Sept, was 1.43 on 07/03/19) : Likely hemodynamically mediated concomitant with use of IV contrast- U/A not remarkable.  Autoimmune work-up unremarkable in the past. No improvement despite Foley catheter.  Started HD 07/18/19 with IR placed [facility] for progressive uremia.  HD 12/31--> MS improved, HD #2 1/3, #3 1/5, #4 1/7, extra 1/8 for UF (overnight into 1/9), next planned 1/11.  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  From a renal perspective I think empiric trial of steroids is fine if other specialists think it would be helpful, but there has been no evidence for GN and I don't anticipate any direct benefit or toxicity to the kidneys.  #Hyperkalemia:  Has been having intermittent hyperK.  Try to avoid lokelma as it irritated throat.  RN was to d/w RD re: tube feeds to make sure low K  - now on nepro.  K elevated today veltassa 8.4g x 1 today.  #Anasarca presumably due to liver disease which was diagnosed at [facility]. She has a history of liver steatosis.No evidence of GN/nephrotic syndrome. 3rd spacing also.    #Moderate to large pericardial effusion:  s/p pericardial window on 12/23- TCTS following, stable  #Hypertension: Blood pressure acceptable. On low dose Coreg. . . #Anemia: s/p transfusion on 12/22.  gave feraheme and one dose of ESA on 12/23.  Hb 9.4 on 1/7.    #Hyperphosphatemia:  Improved with dialysis.  Phos in the 4s currently.    **Malnutrition:  S/p G tube 1/8 which should be useable today.     #Hypoglycemia: recurrentl, likely liver dz + NPO status --> requiring dextrose frequently in past 2 day, with resumption of TF should improve.  Dispo:  She can move to CIR and cont HD.  Palliative care is involved in [place] discussions.    Subjective:    K 5.8 this AM    Objective:   BP (!) 136/92   Pulse 87   Temp 98 F (36.7 C) (Axillary)   Resp 20   Ht 5' 6"  (1.676 m)   Wt 52.1 kg   SpO2 100%   BMI 18.54 kg/m   Intake/Output Summary (Last 24 hours) at 07/30/2019 3559 Last data filed at 07/29/2019 1940 Gross per 24 hour  Intake 711.33 ml  Output 75 ml  Net 636.33 ml   Weight change: 1 kg  Physical Exam: Gen: cachectic, fatigued CVS: RRR, well-healing scars Resp: normal WOB Abd: thin, mildly painful to palpation Ext: + flank edema ACCESS: L TDC c/d/i  Imaging: IR GASTROSTOMY TUBE MOD SED  Result Date: 07/28/2019 INDICATION: 69 year old with malnutrition and failure to thrive. Request for gastrostomy tube placement. EXAM: PERCUTANEOUS GASTROSTOMY TUBE WITH FLUOROSCOPIC GUIDANCE Physician: Stephan Minister. Anselm Pancoast, MD MEDICATIONS: Ancef 2 g; Antibiotics were administered within 1 hour of the procedure. Glucagon 0.5 mg IV ANESTHESIA/SEDATION: Versed 2.0 mg IV; Fentanyl 100 mcg IV Moderate Sedation Time:  22 minutes The patient was continuously monitored during the procedure by the interventional radiology nurse under my direct supervision. FLUOROSCOPY TIME:  Fluoroscopy Time: 2 minutes, 48 seconds, 5 mGy COMPLICATIONS: None  immediate. PROCEDURE: Informed consent was obtained for a percutaneous gastrostomy tube. The patient was placed on the interventional table. An orogastric tube was placed with fluoroscopic guidance. The anterior abdomen was prepped and draped in sterile fashion. Maximal barrier sterile technique was utilized including caps, mask, sterile gowns, sterile gloves, sterile drape, hand hygiene and skin antiseptic. Stomach was inflated with air through the orogastric tube. The  skin and subcutaneous tissues were anesthetized with 1% lidocaine. A 17 gauge needle was directed into the distended stomach with fluoroscopic guidance. A wire was advanced into the stomach and a T-tact was deployed. A 9-French vascular sheath was placed and the orogastric tube was snared using a Gooseneck snare device. The orogastric tube and snare were pulled out of the patient's mouth. The snare device was connected to a 20-French gastrostomy tube. The snare device and gastrostomy tube were pulled through the patient's mouth and out the anterior abdominal wall. The gastrostomy tube was cut to an appropriate length. Contrast injection through gastrostomy tube confirmed placement within the stomach. Fluoroscopic images were obtained for documentation. The gastrostomy tube was flushed with normal saline. IMPRESSION: Successful fluoroscopic guided percutaneous gastrostomy tube placement. Electronically Signed   By: Markus Daft M.D.   On: 07/28/2019 13:59   DG CHEST PORT 1 VIEW  Result Date: 07/28/2019 CLINICAL DATA:  Shortness of breath.  Negative COVID test. EXAM: PORTABLE CHEST 1 VIEW COMPARISON:  07/18/2019. FINDINGS: Interim removal of feeding tube. Right IJ line and left dialysis catheter in stable position. Cardiomegaly. Diffuse severe bilateral pulmonary infiltrates/edema. Moderate bilateral pleural effusions. No pneumothorax. IMPRESSION: 1. Interim removal of feeding tube. Right IJ line and dialysis catheter stable position. 2. Cardiomegaly. Diffuse severe bilateral pulmonary infiltrates/edema. Moderate bilateral pleural effusions. Findings suggest CHF. Electronically Signed   By: Marcello Moores  Register   On: 07/28/2019 14:14    Labs: BMET Recent Labs  Lab 07/25/19 1025 07/25/19 8527 07/26/19 0530 07/27/19 0409 07/28/19 0418 07/29/19 0348 07/30/19 0500  NA 131* 131* 130* 128* 134* 133* 130*  K 6.3* 4.1 5.8* 6.8* 5.3* 4.8 5.8*  CL 92* 93* 91* 90* 92* 92* 92*  CO2 26 28 28 25 30 28 27   GLUCOSE  115* 120* 121* 133* 97 88 140*  BUN 85* 32* 51* 81* 46* 40* 67*  CREATININE 3.96* 1.81* 2.77* 3.75* 2.83* 3.11* 4.15*  CALCIUM 8.6* 7.6* 8.3* 8.5* 8.4* 8.4* 8.7*  PHOS 3.9 1.8* 3.5 4.6 4.3 4.8* 7.4*   CBC Recent Labs  Lab 07/25/19 0454 07/27/19 0410 07/30/19 0500  WBC 8.5 11.3* 4.5  HGB 9.0* 9.4* 9.3*  HCT 30.6* 32.0* 31.2*  MCV 97.1 96.7 95.4  PLT 198 225 230    Medications:    . carvedilol  3.125 mg Oral BID WC  . Chlorhexidine Gluconate Cloth  6 each Topical Daily  . Chlorhexidine Gluconate Cloth  6 each Topical Q0600  . collagenase   Topical BID  . darbepoetin (ARANESP) injection - NON-DIALYSIS  100 mcg Subcutaneous Q Sat-1800  . diphenhydrAMINE  25 mg Oral Once  . feeding supplement (NEPRO CARB STEADY)  1,000 mL Per Tube Q24H  . feeding supplement (PRO-STAT SUGAR FREE 64)  30 mL Per Tube Daily  . fluconazole  200 mg Oral Daily  . gabapentin  100 mg Oral TID  . Gerhardt's butt cream   Topical TID  . heparin  5,000 Units Subcutaneous Q8H  . magic mouthwash w/lidocaine  5 mL Oral TID  . mouth rinse  15 mL Mouth Rinse BID  .  methylPREDNISolone (SOLU-MEDROL) injection  80 mg Intravenous Q12H  . neomycin-bacitracin-polymyxin   Topical BID  . nystatin  5 mL Mouth/Throat QID  . prochlorperazine  5 mg Oral Once  . sertraline  25 mg Oral BID  . sodium chloride flush  3 mL Intravenous Q12H  . sodium chloride flush  3 mL Intravenous Q12H  . sodium zirconium cyclosilicate  10 g Oral Once      Madelon Lips MD [facility] pgr [phone] 07/30/2019, 9:24 AM

## 2019-07-30 NOTE — Progress Notes (Signed)
I have reviewed Ms. Reinard case in detail and discussed at length with palliative care NPs. There is no clear diagnosis for her current condition - she now has a PEG tube for persistent throat pain and inability to meet her nutritional needs, now on hemodialysis for AKI but unclear why she has had such rapid progression to kidney failure, she has hypoalbuminemia but two week ago her albumin was around 3 which is only mildly low. Her anasarca was present then. Additionally she only has very mild hepatic steatosis which would not presumably cause this dramatic decline in health. Acute and chronic inflammation are even more likely contributors and have cause her hypoalbuminemia and led to her effusions and organ dysfunction. Pericardial pathology suggested chronic inflammation and her inflammatory markers are non-specifically elevated.I have also reviewed her documented symptoms namely acute periods of intense itching which have been going on for months even before kidney issues, her throat burning episodes in the hospital and the swelling -these symptoms may be mediated by histamine-mastocytosis reactions, possible carcinoid and I am suspicious for Multisystem Inflammatory Syndrome.  I strongly advised for a empiric trial of high dose steroids given the above findings and her clinical presentation. I also advised to treat her more aggressively for clinically evident oropharyngeal thrush which almost always requires systemic treatment instead of topical.  1. Patient has responded well clinically to steroid initiation.  2. I have added Pepcid and Clartin for H1 and H2 blocking   3. Continue current steroids doses and attempt to get virtual Rheumatology consult.  4. Continue Fluconazole per pharmacy dosing- will need minimum of two weeks possibly 4 weeks of treatment- she may have progression of thrush into esophagus.  5. Will attempt to order additional lab tests for MIS-covid antibody test may be helpful  (no known prior infection), Urine 5HT and Urine and Plasma histamine levels.   Lane Hacker, DO Palliative Medicine

## 2019-07-31 DIAGNOSIS — Z992 Dependence on renal dialysis: Secondary | ICD-10-CM

## 2019-07-31 DIAGNOSIS — N186 End stage renal disease: Secondary | ICD-10-CM

## 2019-07-31 LAB — RENAL FUNCTION PANEL
Albumin: 2.5 g/dL — ABNORMAL LOW (ref 3.5–5.0)
Anion gap: 15 (ref 5–15)
BUN: 103 mg/dL — ABNORMAL HIGH (ref 8–23)
CO2: 24 mmol/L (ref 22–32)
Calcium: 9 mg/dL (ref 8.9–10.3)
Chloride: 91 mmol/L — ABNORMAL LOW (ref 98–111)
Creatinine, Ser: 4.83 mg/dL — ABNORMAL HIGH (ref 0.44–1.00)
GFR calc Af Amer: 10 mL/min — ABNORMAL LOW (ref >60)
GFR calc non Af Amer: 8 mL/min — ABNORMAL LOW (ref >60)
Glucose, Bld: 153 mg/dL — ABNORMAL HIGH (ref 70–99)
Phosphorus: 7.5 mg/dL — ABNORMAL HIGH (ref 2.5–4.6)
Potassium: 6.2 mmol/L — ABNORMAL HIGH (ref 3.5–5.1)
Sodium: 130 mmol/L — ABNORMAL LOW (ref 135–145)

## 2019-07-31 LAB — GLUCOSE, CAPILLARY
Glucose-Capillary: 110 mg/dL — ABNORMAL HIGH (ref 70–99)
Glucose-Capillary: 111 mg/dL — ABNORMAL HIGH (ref 70–99)
Glucose-Capillary: 137 mg/dL — ABNORMAL HIGH (ref 70–99)
Glucose-Capillary: 145 mg/dL — ABNORMAL HIGH (ref 70–99)
Glucose-Capillary: 95 mg/dL (ref 70–99)

## 2019-07-31 MED ORDER — FENTANYL CITRATE (PF) 100 MCG/2ML IJ SOLN
INTRAMUSCULAR | Status: AC
  Filled 2019-07-31: qty 2

## 2019-07-31 MED ORDER — FAMOTIDINE 20 MG PO TABS
10.0000 mg | ORAL_TABLET | Freq: Every day | ORAL | Status: DC
  Administered 2019-08-01 – 2019-08-23 (×23): 10 mg via ORAL
  Filled 2019-07-31 (×23): qty 1

## 2019-07-31 MED ORDER — LORAZEPAM 2 MG/ML IJ SOLN
1.0000 mg | INTRAMUSCULAR | Status: DC | PRN
  Administered 2019-07-31 – 2019-08-02 (×3): 1 mg via INTRAVENOUS
  Filled 2019-07-31 (×3): qty 1

## 2019-07-31 MED ORDER — HEPARIN SODIUM (PORCINE) 1000 UNIT/ML IJ SOLN
INTRAMUSCULAR | Status: AC
  Administered 2019-07-31: 1000 [IU] via INTRAVENOUS_CENTRAL
  Filled 2019-07-31: qty 4

## 2019-07-31 NOTE — Progress Notes (Addendum)
Nutrition Follow-up  DOCUMENTATION CODES:   Severe malnutrition in context of chronic illness, Underweight  INTERVENTION:  Nepro @ 40 ml/hr -via PEG  Prostat 30 ml daily  Tube feeding providing: 1828 kcal, 93 gr protein and 698 ml water  NUTRITION DIAGNOSIS:   Severe Malnutrition related to chronic illness(pericardial effusion w/ volume overload) as evidenced by moderate fat depletion, severe muscle depletion, percent weight loss.  Ongoing; addressed with tube feeding  GOAL:   Patient will meet greater than or equal to 90% of their needs  Presumed; MET with PEG tube placement/initiation enteral feeds  MONITOR:   PO intake, Supplement acceptance, Labs, TF tolerance, Weight trends, I & O's  REASON FOR ASSESSMENT:  Consult -enteral feeding management  ASSESSMENT:   Patient with PMH significant for chronic pericardial effusion, moderate AVR, HTN, hepatic steatosis, and peripheral neuropathy. Presents this admission with anasarca and bilateral pleural effusions.  1/7- Cortrak removed. Pt refusing po's. G-tube placement pending. 1/8- G-tube placed IR. Palliative -GOC discussion. Hypoglycemia episodes. Oropharyngeal candidiasis -receiving fluconazole.  HD treatments required due to AKI per nephrology.  1/9-steroids and G-tube feeding initiated. CIR transition expected. 1/10- pt taking bites of food (20%) per nursing note. Mouth/throat is feeling better. Concern regarding multisystem inflammatory syndrome -per palliative care.  1/11- Pt to receive HD today. She is having problem with anxiety. Tolerating tube feeds- which are meeting 100% est needs. Will continue to follow progression of oral intake as well and adjust TF as indicated.   Intake/Output Summary (Last 24 hours) at 07/31/2019 1048 Last data filed at 07/30/2019 2339 Gross per 24 hour  Intake 669.67 ml  Output 0 ml  Net 669.67 ml   Net since 12/28-+3.875 Liters  Medications reviewed and include:   Pepcid, ProStat  30 ml daily, Nepro @40  ml/hr per tube.  Labs: BUN-103 (H), Cr 4.83 (H), potassium 6.2 (H). CBG's: (last 3) ranging 131-111 mg/dl.    Diet Order:   Diet Order            Diet regular Room service appropriate? Yes; Fluid consistency: Thin; Fluid restriction: 1500 mL Fluid  Diet effective now              EDUCATION NEEDS:   Education needs have been addressed  Skin:  Skin Assessment: Skin Integrity Issues: Skin Integrity Issues:: Stage II, Incisions, Other (Comment) Stage II: sacrum, bilateral nares Incisions: chest, left neck Other: cracking/dryness to BLE  Last BM:  07/26/19-firm abdomen per nursing  Height:   Ht Readings from Last 1 Encounters:  07/04/19 5' 6"  (1.676 m)    Weight:   Wt Readings from Last 1 Encounters:  07/31/19 54.2 kg    Ideal Body Weight:  59.1 kg  BMI:  Body mass index is 19.29 kg/m.  Estimated Nutritional Needs:   Kcal:  1600-1800 kcal  Protein:  80-95 grams  Fluid:  >/= 1.6 L/day   Colman Cater MS,RD,CSG,LDN Office: [phone] Pager: [phone]

## 2019-07-31 NOTE — Progress Notes (Signed)
PT Cancellation Note  Patient Details Name: Joanna Ali MRN: [id number] DOB: [birth year]   Cancelled Treatment:    Reason Eval/Treat Not Completed: Patient at procedure or test/unavailable Pt off floor at HD. Will follow.   Marguarite Arbour A Marie Borowski 07/31/2019, 9:33 AM Marisa Severin, PT, DPT Acute Rehabilitation Services Pager [phone] Office [phone]

## 2019-07-31 NOTE — Evaluation (Signed)
Clinical/Bedside Swallow Evaluation Patient Details  Name: Joanna Ali MRN: [id number] Date of Birth: November 05, 1950  Today's Date: 07/31/2019 Time: SLP Start Time (ACUTE ONLY): 1430 SLP Stop Time (ACUTE ONLY): 1449 SLP Time Calculation (min) (ACUTE ONLY): 19 min  Past Medical History:  Past Medical History:  Diagnosis Date  . Hypertension   . Leg swelling 03/2019  . Short of breath on exertion 03/2019   Past Surgical History:  Past Surgical History:  Procedure Laterality Date  . APPENDECTOMY    . IR FLUORO GUIDE CV LINE LEFT  07/18/2019  . IR GASTROSTOMY TUBE MOD SED  07/28/2019  . IR US GUIDE VASC ACCESS LEFT  07/18/2019  . NECK SURGERY    . RIGHT AND LEFT HEART CATH N/A 07/06/2019   Procedure: RIGHT AND LEFT HEART CATH;  Surgeon: Jolaine Artist, MD;  Location: [facility];  Service: Cardiovascular;  Laterality: N/A;  . TEE WITHOUT CARDIOVERSION N/A 03/28/2019   Procedure: TRANSESOPHAGEAL ECHOCARDIOGRAM (TEE);  Surgeon: Geralynn Rile, MD;  Location: [place];  Service: Cardiology;  Laterality: N/A;  . VIDEO ASSISTED THORACOSCOPY Right 07/12/2019   Procedure: VIDEO ASSISTED THORACOSCOPY Pericardial window ;  Surgeon: Lajuana Matte, MD;  Location: [facility];  Service: Thoracic;  Laterality: Right;   HPI:  69 y.o. female  with past medical history of hypertension, moderate aortic regurgitation, chronic pericardial effusion, osteoarthritis, anasarca (secondary to liver disease?), macular degeneration admitted on 07/03/2019 with shortness of breath and tachycardia. Hospitalization complicated by acute kidney injury requiring dialysis. 12/15 bilateral thoracentesis and 12/23 pericardial window. PEG placed 1/8 due to failure to thrive.    Assessment / Plan / Recommendation Clinical Impression  Oropharyngeal dysphagia supsected with generalized weakness likely in setting of deconditoning from multiple medical comorbidities. No overt s/sx of aspiration were exhibited  with any POs, however PO trials were minimal, due to pt fatigue from dialysis. Laryngeal elevation per palpation appeared reduced. Pt states globus sensation/reduced ability to tolerate solid meats. Notes xerostomia at baseline. Pt s/p PEG 07/28/19 due to failure to thrive. Continue regular thin liquid diet with PEG for nutritional support per RD/MD recommendations. SLP to follow up with enhance swallow safety and efficiency, further assess with PO meal consumption .   SLP Visit Diagnosis: Dysphagia, unspecified (R13.10)    Aspiration Risk  Mild aspiration risk    Diet Recommendation   Regular, thin liquids   Medication Administration: Other (Comment)(as tolerated, crush larger PO medicines )    Other  Recommendations Oral Care Recommendations: Oral care BID   Follow up Recommendations 24 hour supervision/assistance;Inpatient Rehab      Frequency and Duration min 1 x/week  1 week       Prognosis Prognosis for Safe Diet Advancement: Good Barriers to Reach Goals: Severity of deficits      Swallow Study   General Date of Onset: 07/03/19 HPI: 69 y.o. female  with past medical history of hypertension, moderate aortic regurgitation, chronic pericardial effusion, osteoarthritis, anasarca (secondary to liver disease?), macular degeneration admitted on 07/03/2019 with shortness of breath and tachycardia. Hospitalization complicated by acute kidney injury requiring dialysis. 12/15 bilateral thoracentesis and 12/23 pericardial window. PEG placed 1/8 due to failure to thrive.  Type of Study: Bedside Swallow Evaluation Previous Swallow Assessment: none on file Diet Prior to this Study: Regular;Thin liquids;Other (Comment);PEG tube(PEG for primary nutrition due to FTT ) Temperature Spikes Noted: No Respiratory Status: Room air History of Recent Intubation: No Behavior/Cognition: Alert;Pleasant mood;Cooperative Oral Cavity Assessment: Within Functional Limits Oral  Care Completed by SLP: No Oral  Cavity - Dentition: Adequate natural dentition Vision: Functional for self-feeding Self-Feeding Abilities: Able to feed self;Needs set up Patient Positioning: Upright in chair Baseline Vocal Quality: Low vocal intensity Volitional Cough: Weak Volitional Swallow: Able to elicit    Oral/Motor/Sensory Function Overall Oral Motor/Sensory Function: Generalized oral weakness   Ice Chips Ice chips: Impaired Presentation: Spoon Oral Phase Functional Implications: Prolonged oral transit Pharyngeal Phase Impairments: Suspected delayed Swallow;Decreased hyoid-laryngeal movement;Multiple swallows   Thin Liquid Thin Liquid: Impaired Presentation: Cup;Straw Pharyngeal  Phase Impairments: Suspected delayed Swallow;Multiple swallows;Decreased hyoid-laryngeal movement    Nectar Thick Nectar Thick Liquid: Not tested   Honey Thick Honey Thick Liquid: Not tested   Puree Puree: (declined puree )   Solid     Solid: Impaired Presentation: Self Fed Oral Phase Impairments: Impaired mastication;Reduced lingual movement/coordination Oral Phase Functional Implications: Prolonged oral transit Pharyngeal Phase Impairments: Suspected delayed Swallow;Multiple swallows      Libero Puthoff E Juel Ripley MA, CCC-SLP Acute Rehabilitation Services  07/31/2019,3:02 PM

## 2019-07-31 NOTE — Procedures (Signed)
Patient was seen on dialysis and the procedure was supervised.  BFR 350  Via TDC BP is  153/87.   Patient appears to be tolerating treatment well  Louis Meckel 07/31/2019

## 2019-07-31 NOTE — Progress Notes (Signed)
Inpatient Rehab Admissions Coordinator:   Met with pt and husband at bedside.  She is up in the chair and looks much better than when I saw her last week.  Plan for CLIP per Terri Piedra, renal navigator.  Will plan for admission once pt has outpatient dialysis seat and is medically ready.   Shann Medal, PT, DPT Admissions Coordinator [phone] 07/31/19  3:58 PM

## 2019-07-31 NOTE — Progress Notes (Addendum)
Progress Note  [name] Date of Encounter: 07/31/2019  Primary Cardiologist: Evalina Field, MD   Subjective   Going for dialysis today.  Says had "panic attack" this morning and pain across chest. Resolved spontaneously.  On steroids and G tube for feeding.  Breathing stable.   Inpatient Medications    Scheduled Meds:  carvedilol  3.125 mg Oral BID WC   Chlorhexidine Gluconate Cloth  6 each Topical Daily   Chlorhexidine Gluconate Cloth  6 each Topical Q0600   collagenase   Topical BID   darbepoetin (ARANESP) injection - NON-DIALYSIS  100 mcg Subcutaneous Q Sat-1800   diphenhydrAMINE  25 mg Oral Once   famotidine  40 mg Oral BID   feeding supplement (NEPRO CARB STEADY)  1,000 mL Per Tube Q24H   feeding supplement (PRO-STAT SUGAR FREE 64)  30 mL Per Tube Daily   fluconazole  200 mg Oral Daily   gabapentin  100 mg Oral TID   Gerhardt's butt cream   Topical TID   heparin  5,000 Units Subcutaneous Q8H   loratadine  10 mg Oral Daily   mouth rinse  15 mL Mouth Rinse BID   methylPREDNISolone (SOLU-MEDROL) injection  80 mg Intravenous Q12H   neomycin-bacitracin-polymyxin   Topical BID   nystatin  5 mL Mouth/Throat QID   prochlorperazine  5 mg Oral Once   sertraline  25 mg Oral BID   sodium chloride flush  3 mL Intravenous Q12H   sodium chloride flush  3 mL Intravenous Q12H   Continuous Infusions:  sodium chloride 500 mL (07/06/19 1043)   sodium chloride     sodium chloride     sodium chloride     dextrose Stopped (07/29/19 1707)   PRN Meds: sodium chloride, sodium chloride, sodium chloride, sodium chloride, acetaminophen (TYLENOL) oral liquid 160 mg/5 mL, alteplase, alum & mag hydroxide-simeth, bisacodyl, bisacodyl, camphor-menthol, fentaNYL (SUBLIMAZE) injection, heparin, hydrOXYzine, lidocaine (PF), lidocaine-prilocaine, morphine injection, ondansetron (ZOFRAN) IV, oxyCODONE, pentafluoroprop-tetrafluoroeth, polyethylene glycol, polyvinyl alcohol,  promethazine, senna-docusate, sodium chloride flush, traMADol, witch hazel-glycerin   Vital Signs    Vitals:   07/30/19 1935 07/30/19 2339 07/31/19 0306 07/31/19 0755  BP: 128/81 131/85 (!) 145/82   Pulse: 83 77 80   Resp: 17 11 13    Temp: 97.7 F (36.5 C) (!) 96.8 F (36 C) (!) 97 F (36.1 C) 97.8 F (36.6 C)  TempSrc: Oral Axillary Axillary Oral  SpO2: 100% 100% 100%   Weight:      Height:        Intake/Output Summary (Last 24 hours) at 07/31/2019 0849 Last data filed at 07/30/2019 2339 Gross per 24 hour  Intake 669.67 ml  Output 0 ml  Net 669.67 ml   Last 3 Weights 07/30/2019 07/29/2019 07/28/2019  Weight (lbs) 114 lb 13.8 oz 111 lb 12.4 oz 109 lb 9.1 oz  Weight (kg) 52.1 kg 50.7 kg 49.7 kg      Telemetry    SR at 80-100s - Personally Reviewed  ECG    No new tracing   Physical Exam   GEN: Thin frail ill appearing female in no acute distress.   Neck: No JVD Cardiac: RRR, + murmurs, rubs, or gallops.  Respiratory: Clear to auscultation bilaterally. GI: tender to palpations MS: trace edema; No deformity. Neuro:  Nonfocal  Psych: Normal affect   Labs    High Sensitivity Troponin:   Recent Labs  Lab 07/03/19 1739 07/03/19 1930  TROPONINIHS 149* 163*  Chemistry Recent Labs  Lab 07/29/19 0348 07/30/19 0500 07/31/19 0509  NA 133* 130* 130*  K 4.8 5.8* 6.2*  CL 92* 92* 91*  CO2 28 27 24   GLUCOSE 88 140* 153*  BUN 40* 67* 103*  CREATININE 3.11* 4.15* 4.83*  CALCIUM 8.4* 8.7* 9.0  ALBUMIN 2.3* 2.3* 2.5*  GFRNONAA 14* 10* 8*  GFRAA 17* 12* 10*  ANIONGAP 13 11 15      Hematology Recent Labs  Lab 07/25/19 0454 07/27/19 0410 07/30/19 0500  WBC 8.5 11.3* 4.5  RBC 3.15* 3.31* 3.27*  HGB 9.0* 9.4* 9.3*  HCT 30.6* 32.0* 31.2*  MCV 97.1 96.7 95.4  MCH 28.6 28.4 28.4  MCHC 29.4* 29.4* 29.8*  RDW 19.6* 19.8* 18.7*  PLT 198 225 230    Radiology    No results found.  Cardiac Studies   Echo 07/17/19 1. Left ventricular ejection fraction,  by visual estimation, is 50 to 55%. The left ventricle has low normal function. There is mildly increased left ventricular hypertrophy.  2. Moderate hypokinesis of the left ventricular, entire septal wall.  3. The left ventricle demonstrates regional wall motion abnormalities.  4. Global right ventricle has normal systolic function.The right ventricular size is normal. No increase in right ventricular wall thickness.  5. Left atrial size was normal.  6. Right atrial size was mildly dilated.  7. Moderate pleural effusion in the left lateral region.  8. The pericardial effusion is circumferential.  9. Trivial pericardial effusion is present. 10. The mitral valve is abnormal. Mild mitral valve regurgitation. 11. The tricuspid valve is grossly normal. Tricuspid valve regurgitation moderate. 12. Aortic valve regurgitation is moderate. 13. The aortic valve is tricuspid. Aortic valve regurgitation is moderate. Mild aortic valve sclerosis without stenosis. 14. The pulmonic valve was grossly normal. Pulmonic valve regurgitation is not visualized. 15. Moderately elevated pulmonary artery systolic pressure. 16. The inferior vena cava is dilated in size with >50% respiratory variability, suggesting right atrial pressure of 8 mmHg.   RIGHT AND LEFT HEART CATH 07/06/19  Conclusion  Baseline   Findings:   RA = 7  RV = 42/7 PA = 48/17 (28) PCW = 16 LV = 122/8 Fick cardiac output/index = 5.0/3.2 PVR = 2.4 WU FA sat = 98% PA sat = 62%, 62%   Prominent y-descents in RA tracing but no square root sign in ventricular tracings and no RV/LV interdeprendence   After 350 cc NS bolus   RA = 9 RV = 46/9 PA = 48/24 (33) PCW = 23 LV = 125/15   Prominent y-descents in RA tracing but no square root sign in ventricular tracings and no RV/LV interdeprendence   Assessment: 1. Moderate pericardial effusion 2. Mild PAH 3. 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  D/w Dr. Marlou Porch. If effusion becomes hemodynamically significant in future can consider window.     Patient Profile     69 y.o. female with moderate AR, anasarca, mild hepatic steatosis admitted 12/14 with volume overload and tachycardia.  Hospitalization has been complicated by AKI now requiring HD and pericardial effusion s/p pericardial window.  Assessment & Plan    1. Anasarca / protein deficiency - Probably related to liver disease (dx with liver steatosis at [facility]) and protein deficiency - Volume status improved with dialysis - seem by medicine team on 12/30,  plan to follow along but no notes - Negative EGD biopsy for amyloidosis - on feeding tube  2. Pericardial effusion s/p pericardial window - no tamponade futures - Last eacho 12/28 showed trivial effusion - Vital stable  3. Moderate AR - Volume managed by dialysis   4. Labile hypertension - Continue Coreg  5. AKI - Followed by nephrology. Appreciate recommendations - For dialysis today  Disposition - Plan for CIR (pending evaluation). Patient has been followed by palliative care. Started on steroids and Fluconazole for thrush. Also recommended "General Medicine consult, as understand they will follow upon transition to rehabilitation" On cardiac stand point she is ready to go to rehab.  - Will call general medicine team.    For questions or updates, please contact [place] Please consult [email or web address] for contact info under        Jarrett Soho, PA  07/31/2019, 8:49 AM    Patient examined chart reviewed. Sub xiphoid pericardial window healing She has anasarca from liver disease, acute on chronic renal failure now on dialysis  and G tube Started on steroids  Cardiology not actively involved with current care. Transfer to medical  service unless she goes to inpatient rehab today   Jenkins Rouge MD [facility]

## 2019-07-31 NOTE — Progress Notes (Signed)
Physical Therapy Treatment Patient Details Name: Joanna Ali MRN: [id number] DOB: 04-02-1951 Today's Date: 07/31/2019    History of Present Illness Pt is a 69 year old woman admitted 07/03/19 with anasarca, fatigue and tachycardia. Hospital course complicated by AKI with pt now on HD and pericardial effusion s/p pericardial window. Gtube to be placed 07/28/19. PMH: HTN, former smoker and alcohol abuse, mild hepatic steatosis, chronic pleural effusion, aortic valve regurgitation    PT Comments    Patient progressing slowly towards PT goals. Better able to vocalize today. Reports some pain around abdomen describing it as a "tight band squeezing me." Requires Mod A for bed mobility and Mod A of 2 for standing transfers. Able to take a few steps to get to chair with assist and use of RW. Limited by pain in abdomen and back today. VSS throughout. Eager to get to rehab. Will follow.    Follow Up Recommendations  CIR;Supervision for mobility/OOB;Supervision/Assistance - 24 hour     Equipment Recommendations  Rolling walker with 5" wheels    Recommendations for Other Services       Precautions / Restrictions Precautions Precautions: Fall Restrictions Weight Bearing Restrictions: No    Mobility  Bed Mobility Overal bed mobility: Needs Assistance Bed Mobility: Supine to Sit     Supine to sit: Mod assist;HOB elevated     General bed mobility comments: Assist to bring LEs to EOB and to reach for rail and elevate trunk.  Transfers Overall transfer level: Needs assistance Equipment used: Rolling walker (2 wheeled) Transfers: Sit to/from Stand Sit to Stand: +2 physical assistance;Mod assist         General transfer comment: assist to rise using bed pad under hips and gait belt. LEs supported on bed rail initially. Stood from EOB x2, posterior lean.  Ambulation/Gait Ambulation/Gait assistance: Mod assist;+2 safety/equipment Gait Distance (Feet): 3 Feet Assistive device: Rolling  walker (2 wheeled) Gait Pattern/deviations: Trunk flexed;Leaning posteriorly;Shuffle Gait velocity: decreased   General Gait Details: Able to take a few steps towards chair with assist for balance, RW management and support. Bil knee instability noted.   Stairs             Wheelchair Mobility    Modified Rankin (Stroke Patients Only)       Balance Overall balance assessment: Needs assistance Sitting-balance support: Feet supported;No upper extremity supported Sitting balance-Leahy Scale: Fair Sitting balance - Comments: Min guard assist.   Standing balance support: During functional activity Standing balance-Leahy Scale: Poor Standing balance comment: Requires UE support and external support, posterior lean.                            Cognition Arousal/Alertness: Awake/alert Behavior During Therapy: WFL for tasks assessed/performed Overall Cognitive Status: No family/caregiver present to determine baseline cognitive functioning Area of Impairment: Problem solving                             Problem Solving: Difficulty sequencing;Decreased initiation;Requires verbal cues;Requires tactile cues General Comments: for basic mobility tasks. laughs appropriately at jokes. more verbal today.      Exercises      General Comments General comments (skin integrity, edema, etc.): Spouse present during session. VSS throughout.      Pertinent Vitals/Pain Pain Assessment: Faces Faces Pain Scale: Hurts whole lot Pain Location: abdomen, "feels like a tight rubber band around my chest" Pain Descriptors / Indicators: Discomfort;Grimacing;Guarding;Moaning Pain  Intervention(s): Repositioned;Monitored during session;Limited activity within patient's tolerance    Home Living                      Prior Function            PT Goals (current goals can now be found in the care plan section) Progress towards PT goals: Progressing toward goals     Frequency    Min 3X/week      PT Plan Current plan remains appropriate    Co-evaluation              AM-PAC PT "6 Clicks" Mobility   Outcome Measure  Help needed turning from your back to your side while in a flat bed without using bedrails?: A Little Help needed moving from lying on your back to sitting on the side of a flat bed without using bedrails?: A Lot Help needed moving to and from a bed to a chair (including a wheelchair)?: A Lot Help needed standing up from a chair using your arms (e.g., wheelchair or bedside chair)?: A Lot Help needed to walk in hospital room?: A Lot Help needed climbing 3-5 steps with a railing? : Total 6 Click Score: 12    End of Session Equipment Utilized During Treatment: Gait belt Activity Tolerance: Patient limited by pain;Patient tolerated treatment well Patient left: in chair;with call bell/phone within reach;with family/visitor present Nurse Communication: Mobility status PT Visit Diagnosis: Unsteadiness on feet (R26.81);Muscle weakness (generalized) (M62.81);Difficulty in walking, not elsewhere classified (R26.2);Pain Pain - part of body: (abdomen)     Time: 1236-1300 PT Time Calculation (min) (ACUTE ONLY): 24 min  Charges:  $Therapeutic Activity: 23-37 mins                     Joanna Ali, PT, DPT Acute Rehabilitation Services Pager [phone] Office [phone]       Joanna Ali 07/31/2019, 4:40 PM

## 2019-07-31 NOTE — Consult Note (Addendum)
Medical Consultation   Joanna Ali  CLE:[id number]  DOB: [birth year]  DOA: 07/03/2019  PCP: Renaldo Reel, DO   Requesting physician: Johnsie Cancel - cardiology  Reason for consultation: Medical consult - we consulted on 12/30, getting ready for transfer to CIR.  Palliative care consult yesterday.  Inpatient rehab consult today for possible transfer.   History of Present Illness: Joanna Ali is an 69 y.o. female with PMH significant for hypertension, chronic pericardial effusion, moderate aortic valve regurgitation, former smoker, and alcohol abuse.  Initially admitted to the hospital on 12/17 for progressive shortness of breath with anasarca and fatigue.  The symptoms  appear to have started back in September. She had been being followed by cardiology in the outpatient setting.  Previously, worked up by [facility] rheumatology with negative work-up including ESR, lupus anticoagulant, rheumatoid factor, TSH, ANCA, ANA, and light free chain studies.  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  Since that time patient had stopped drinking alcohol as advised.  She had been on diuretics of spironolactone and torosemide without any improvement in symptoms.  Her kidney function appeared to start declining in at the beginning of this month.  CT angiogram of the chest on 12/15 noted moderate pericardial effusion and airspace disease.  Labs also revealed elevated BNP was elevated.   Underwent thoracentesis by PCCM which removed 700 cc of fluid.  Patient had undergone right heart cath 12/17, and underwent pericardial window on 12/23. Attempts were made with IV diuretics, but patient developed worsening kidney function with creatinine up to 5.  Nephrology was formally consulted and started dialysis due to worsening kidney function.  Husband notes patient  having poor p.o. intake and therefore core track was placed.  Since last medicine consult, she has a PEG tube for persistent throat pain and inability to meet her nutritional needs; on HD for AKI with rapid progression to ESRD on HD; severe anasarca; and pericardial effusion s/p window.  She has been recently started on empiric steroids.  At the time of my evaluation, she complained of severe back pain while in HD.  We tried moving the patient around in the bed and provided Fentanyl.  She requested a back rub, which there was no time to provide.  She had no other obvious complaints at this time.  Her husband reports that "it's been an ongoing mystery" with doctors at [facility] x 2 and a [facility] x 1 all trying to figure it out.  She has been identified as having some heart issues and severe edema.  She was given stronger diuretics and was discharged but she had progression. He took her to [facility] and she was diagnosed with fatty liver and she was treated with some stabilization but the edema never went away and she developed nerve pain. She saw neurology as an outpatient and got additional medications.  Hepatology at [facility] asked her to come back for a liver biopsy since it didn't appear to be bad enough to cause symptoms; he didn't think her liver situation explained the gravity of her other issues.  She developed breathing problems about a month ago and she went back to her cardiologist.  Dr. Audie Box sent her for hospitalization.  While here, her kidneys have gone bad in the process, possibly related to contrast dye-induced nephropathy.  He reports that she is getting better with regards to edema and nerve pain.  Both nephrology, hepatologist, and cardiologist suspect rheumatologic disease - but she has seen rheum and they have not found a cause yet.  A few days ago, they suggested steroids empirically; it is too early to tell if there has been enough time for improvement.  She does not have chronic back pain; her husband  thinks it is due to immobility.     Review of Systems:  ROS As per HPI otherwise 10 point review of systems negative.    Past Medical History: Past Medical History:  Diagnosis Date  . Hypertension   . Leg swelling 03/2019  . Short of breath on exertion 03/2019    Past Surgical History: Past Surgical History:  Procedure Laterality Date  . APPENDECTOMY    . IR FLUORO GUIDE CV LINE LEFT  07/18/2019  . IR GASTROSTOMY TUBE MOD SED  07/28/2019  . IR US GUIDE VASC ACCESS LEFT  07/18/2019  . NECK SURGERY    . RIGHT AND LEFT HEART CATH N/A 07/06/2019   Procedure: RIGHT AND LEFT HEART CATH;  Surgeon: Jolaine Artist, MD;  Location: [place] CV LAB;  Service: Cardiovascular;  Laterality: N/A;  . TEE WITHOUT CARDIOVERSION N/A 03/28/2019   Procedure: TRANSESOPHAGEAL ECHOCARDIOGRAM (TEE);  Surgeon: Geralynn Rile, MD;  Location: [place];  Service: Cardiology;  Laterality: N/A;  . VIDEO ASSISTED THORACOSCOPY Right 07/12/2019   Procedure: VIDEO ASSISTED THORACOSCOPY Pericardial window ;  Surgeon: Lajuana Matte, MD;  Location: [place];  Service: Thoracic;  Laterality: Right;     Allergies:  No Known Allergies   Social History:  reports that she quit smoking about 40 years ago. Her smoking use included cigarettes. [name]. She has never used smokeless tobacco. [name].   Family History: Family History  Problem Relation Age of Onset  . Hypertension Mother       Physical Exam: Vitals:   07/31/19 1030 07/31/19 1100 07/31/19 1130 07/31/19 1140  BP: 127/88 134/90 126/80 130/84  Pulse: (!) 101 (!) 102 95 85  Resp:    15  Temp:    97.7 F (36.5 C)  TempSrc:    Oral  SpO2:    100%  Weight:    52 kg  Height:        Constitutional: Alert and awake, oriented x3, cachectic, appears uncomfortable due to back pain. Eyes: EOMI, irises appear normal, anicteric sclera,  ENMT: external  ears and nose appear normal, normal hearing, Lips appear normal, oropharynx mucosa Neck: neck appears normal, no masses, normal ROM, no thyromegaly, no JVD  CVS: S1-S2 clear, no murmur rubs or gallops, 2-3+ LE edema Respiratory:  clear to auscultation bilaterally, no wheezing, rales or rhonchi. Respiratory effort normal. No accessory muscle use.  Abdomen: soft, thin, mildly tender diffusely to palpation, nondistended, PEG tube in place Musculoskeletal: : no cyanosis, clubbing or edema noted bilaterally Neuro: Cranial nerves II-XII intact, strength, sensation Psych: judgement and insight appear normal, flat mood and affect, mental status Skin: no rashes or lesions or ulcers, no induration or nodules    Data reviewed:  I have personally reviewed the recent labs and imaging studies  Pertinent Labs:   Na++ 130 K+ 6.2 Glucose 153 BUN 103/Creatinine 4.83/GFR 9; 67/4.15/10 on 1/10 Phos 7.5 Albumin 2.5 WBC 4.5 on 1/10 Hgb 9.3 on 1/10 ESR 25 on 1/5 Scleroderma  Ab <0.2 Cryoglobulin negative    Inpatient Medications:   Scheduled Meds: . carvedilol  3.125 mg Oral BID WC  . Chlorhexidine Gluconate Cloth  6 each Topical Daily  . Chlorhexidine Gluconate Cloth  6 each Topical Q0600  . collagenase   Topical BID  . darbepoetin (ARANESP) injection - NON-DIALYSIS  100 mcg Subcutaneous Q Sat-1800  . diphenhydrAMINE  25 mg Oral Once  . famotidine  40 mg Oral BID  . feeding supplement (NEPRO CARB STEADY)  1,000 mL Per Tube Q24H  . feeding supplement (PRO-STAT SUGAR FREE 64)  30 mL Per Tube Daily  . fentaNYL      . fluconazole  200 mg Oral Daily  . gabapentin  100 mg Oral TID  . Gerhardt's butt cream   Topical TID  . heparin  5,000 Units Subcutaneous Q8H  . loratadine  10 mg Oral Daily  . mouth rinse  15 mL Mouth Rinse BID  . methylPREDNISolone (SOLU-MEDROL) injection  80 mg Intravenous Q12H  . neomycin-bacitracin-polymyxin   Topical BID  . nystatin  5 mL Mouth/Throat QID  .  prochlorperazine  5 mg Oral Once  . sertraline  25 mg Oral BID  . sodium chloride flush  3 mL Intravenous Q12H  . sodium chloride flush  3 mL Intravenous Q12H   Continuous Infusions: . sodium chloride 500 mL (07/06/19 1043)  . sodium chloride    . sodium chloride    . sodium chloride    . dextrose Stopped (07/29/19 1707)     Radiological Exams on Admission: No results found.  Impression/Recommendations Principal Problem:   Anasarca Active Problems:   Pericardial effusion   Hypertension   Elevated troponin   Atypical pneumonia   Hypoxia   Protein-calorie malnutrition, severe   Pressure injury of skin   Failure to thrive in adult   Goals of care, counseling/discussion   Dysphagia   Muscular weakness   Thrush of mouth and esophagus (HCC)   ESRD on hemodialysis ([place])   Anasarca -Patient with h/o anasarca thought to be related to chronic ETOH use and hepatic steatosis, followed by [facility] hepatology -She has had what sounds like 2 inflammatory evaluations with rheumatology/hematology -Continues to have diffuse anasarca which is improving to some extent with HD -Uncertain etiology, volume management with HD and improving/stabilizing -She is on steroids to treat empirically in case there is an as-yet undetermined rheumatologic cause, but her husband reports that it is too soon to tell if this is helping yet  ESRD on HD -Patient with progressive AKI, thought to be exacerbated by contrast-induced nephropathy -Started on HD with some improvement in ansarca -Nephrology is following and is unsure if ESRD will be needed long-term -Awaiting CLIP and then should be appropriate for transfer to CIR; I attempted to reach the Renal Navigator Terri Piedra) to ascertain a timeframe on this but was unable to reach her  Pericardial effusion with elevated troponin -s/p pericardial window on 12/23, under care of cardiology and CT surgery -She had the pericardial effusion as well as pleural  effusions that are thought to be stable at this time  Failure to thrive, goals of care, severe malnutrition -Followed by nutrition with tube feeds, Prostat; now meeting nutritional needs with enteral feeding supplementation -She had a negative EGD biopsy for amyloidosis -Appears to be medically stable for CIR -Palliative care has been involved  Atypical PNA with hypoxia -Resolved, no longer on abx  Dysphagia, thrush -Feeding tube -On Fluconazole  Stage 2 pressure ulcers -Sacrum  and nares -Continue Gerhardt's butt cream  HTN -Continue Coreg  Back pain -Thought to be related to deconditioning -Continue Fentanyl, Oxycodone, Ultram  ?Anxiety -Appears to waking up out of sleep with acute SOB despite normal O2 sats on 2L [place] O2 -Her chest feels like a band around it and "can't breathe" -Will order Ativan    Thank you for this consultation.  Our [facility] hospitalist team will follow the patient with you.  If the patient is not accepted by CIR in the next 1-2 days, we will plan to assume care on our service soon.   Time Spent: 39 minutes  Karmen Bongo M.D. [facility] 07/31/2019, 3:41 PM

## 2019-07-31 NOTE — Plan of Care (Signed)

## 2019-07-31 NOTE — Progress Notes (Signed)
RN called into pts room O2 Sats in the 80's. 2L of O2 placed on pt. O2 Sats rebounded to 98%. Patient has complaints of not being able to breathe, chest tightness coming and going. RN coached pt to breathe slow and deep. Patient dangling on the edge of bed. Breathing slowed. O2 Sats 100% on 2L of O2. RN paged Dr. Lorin Mercy for orders for anxiety. RN awaiting orders. RN will continue to monitor.

## 2019-07-31 NOTE — Progress Notes (Signed)
[facility] Progress Note    Assessment/ Plan:   Pt is a 69 y.o. yo female  with history of HTN, former smoker, hepatic steatosis with low albumin, chronic pericardial effusion, moderate AR admitted on 12/17 for progressive dyspnea, anasarca , seen as a consultationfor AKIand fluid overload.  #Acute kidney injury,nonoliguric (crt normal in Sept, was 1.43 on 07/03/19) : Likely hemodynamically mediated concomitant with use of IV contrast- U/A not remarkable.  Autoimmune work-up unremarkable in the past.   Started HD 07/18/19 with IR placed [facility] for progressive uremia.  HD 12/31--> MS improved, HD #2 1/3, #3 1/5, #4 1/7, extra 1/8 for UF (overnight into 1/9), next planned for today- needs due to K and BUN over 100 and also this feeling of SOB.  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  From a renal perspective I think empiric trial of steroids is fine  but there has been no evidence for GN and I don't anticipate any direct benefit or toxicity to the kidneys.  #Hyperkalemia:  Has been having intermittent hyperK.  Try to avoid lokelma as it irritated throat. - now on nepro.  K elevated today pre HD- 2 K bath  #Anasarca presumably due to liver disease which was diagnosed at [facility]. She has a history of liver steatosis.No evidence of GN/nephrotic syndrome. 3rd spacing also. volume status much improved   #Moderate to large pericardial effusion:  s/p pericardial window on 12/23- TCTS following, stable  #Hypertension: Blood pressure acceptable. On low dose Coreg. . . #Anemia: s/p transfusion on 12/22.  gave feraheme and on ESA weekly.  Hb stable in the 9's    #Hyperphosphatemia:  Improved with dialysis.  Not her biggest issue right now  **Malnutrition:  S/p G tube 1/8 -  Albumin low   Dispo:  She can move to CIR and cont HD.  Palliative care is involved in [place]  discussions. This has been a very long road for her   Subjective:    K 6.2 this AM -  Wanting to sit up in bed- saying that she cant breathe-  O@ sat 100%--- better sitting up and with O2   Objective:   BP (!) 145/82 (BP Location: Left Arm)   Pulse 80   Temp (!) 97 F (36.1 C) (Axillary)   Resp 13   Ht 5' 6"  (1.676 m)   Wt 52.1 kg   SpO2 100%   BMI 18.54 kg/m   Intake/Output Summary (Last 24 hours) at 07/31/2019 0736 Last data filed at 07/30/2019 2339 Gross per 24 hour  Intake 754.34 ml  Output 0 ml  Net 754.34 ml   Weight change:   Physical Exam: Gen: cachectic, fatigued CVS: RRR, well-healing scars Resp: normal WOB Abd: thin, mildly painful to palpation Ext: + flank edema ACCESS: L TDC c/d/i  Imaging: No results found.  Labs: BMET Recent Labs  Lab 07/25/19 0925 07/26/19 0530 07/27/19 0409 07/28/19 0418 07/29/19 0348 07/30/19 0500 07/31/19 0509  NA 131* 130* 128* 134* 133* 130* 130*  K 4.1 5.8* 6.8* 5.3* 4.8 5.8* 6.2*  CL 93* 91* 90* 92* 92* 92* 91*  CO2 28 28 25 30 28 27 24   GLUCOSE 120* 121* 133* 97 88 140* 153*  BUN 32* 51* 81* 46* 40* 67* 103*  CREATININE 1.81* 2.77* 3.75* 2.83* 3.11* 4.15* 4.83*  CALCIUM 7.6* 8.3* 8.5* 8.4* 8.4* 8.7* 9.0  PHOS 1.8*  3.5 4.6 4.3 4.8* 7.4* 7.5*   CBC Recent Labs  Lab 07/25/19 0454 07/27/19 0410 07/30/19 0500  WBC 8.5 11.3* 4.5  HGB 9.0* 9.4* 9.3*  HCT 30.6* 32.0* 31.2*  MCV 97.1 96.7 95.4  PLT 198 225 230    Medications:    . carvedilol  3.125 mg Oral BID WC  . Chlorhexidine Gluconate Cloth  6 each Topical Daily  . Chlorhexidine Gluconate Cloth  6 each Topical Q0600  . collagenase   Topical BID  . darbepoetin (ARANESP) injection - NON-DIALYSIS  100 mcg Subcutaneous Q Sat-1800  . diphenhydrAMINE  25 mg Oral Once  . famotidine  40 mg Oral BID  . feeding supplement (NEPRO CARB STEADY)  1,000 mL Per Tube Q24H  . feeding supplement (PRO-STAT SUGAR FREE 64)  30 mL Per Tube Daily  . fluconazole  200 mg  Oral Daily  . gabapentin  100 mg Oral TID  . Gerhardt's butt cream   Topical TID  . heparin  5,000 Units Subcutaneous Q8H  . loratadine  10 mg Oral Daily  . mouth rinse  15 mL Mouth Rinse BID  . methylPREDNISolone (SOLU-MEDROL) injection  80 mg Intravenous Q12H  . neomycin-bacitracin-polymyxin   Topical BID  . nystatin  5 mL Mouth/Throat QID  . prochlorperazine  5 mg Oral Once  . sertraline  25 mg Oral BID  . sodium chloride flush  3 mL Intravenous Q12H  . sodium chloride flush  3 mL Intravenous Q12H      [facility]  07/31/2019, 7:36 AM

## 2019-08-01 ENCOUNTER — Inpatient Hospital Stay (HOSPITAL_COMMUNITY): Payer: Medicare Other

## 2019-08-01 DIAGNOSIS — R0600 Dyspnea, unspecified: Secondary | ICD-10-CM

## 2019-08-01 DIAGNOSIS — R0602 Shortness of breath: Secondary | ICD-10-CM

## 2019-08-01 DIAGNOSIS — Z9889 Other specified postprocedural states: Secondary | ICD-10-CM

## 2019-08-01 LAB — RENAL FUNCTION PANEL
Albumin: 2.5 g/dL — ABNORMAL LOW (ref 3.5–5.0)
Anion gap: 15 (ref 5–15)
BUN: 87 mg/dL — ABNORMAL HIGH (ref 8–23)
CO2: 24 mmol/L (ref 22–32)
Calcium: 8.5 mg/dL — ABNORMAL LOW (ref 8.9–10.3)
Chloride: 93 mmol/L — ABNORMAL LOW (ref 98–111)
Creatinine, Ser: 3.94 mg/dL — ABNORMAL HIGH (ref 0.44–1.00)
GFR calc Af Amer: 13 mL/min — ABNORMAL LOW (ref >60)
GFR calc non Af Amer: 11 mL/min — ABNORMAL LOW (ref >60)
Glucose, Bld: 141 mg/dL — ABNORMAL HIGH (ref 70–99)
Phosphorus: 5.8 mg/dL — ABNORMAL HIGH (ref 2.5–4.6)
Potassium: 6 mmol/L — ABNORMAL HIGH (ref 3.5–5.1)
Sodium: 132 mmol/L — ABNORMAL LOW (ref 135–145)

## 2019-08-01 LAB — BASIC METABOLIC PANEL
Anion gap: 19 — ABNORMAL HIGH (ref 5–15)
BUN: 103 mg/dL — ABNORMAL HIGH (ref 8–23)
CO2: 23 mmol/L (ref 22–32)
Calcium: 8.7 mg/dL — ABNORMAL LOW (ref 8.9–10.3)
Chloride: 89 mmol/L — ABNORMAL LOW (ref 98–111)
Creatinine, Ser: 4.2 mg/dL — ABNORMAL HIGH (ref 0.44–1.00)
GFR calc Af Amer: 12 mL/min — ABNORMAL LOW (ref >60)
GFR calc non Af Amer: 10 mL/min — ABNORMAL LOW (ref >60)
Glucose, Bld: 137 mg/dL — ABNORMAL HIGH (ref 70–99)
Potassium: 5.4 mmol/L — ABNORMAL HIGH (ref 3.5–5.1)
Sodium: 131 mmol/L — ABNORMAL LOW (ref 135–145)

## 2019-08-01 LAB — GLUCOSE, CAPILLARY
Glucose-Capillary: 109 mg/dL — ABNORMAL HIGH (ref 70–99)
Glucose-Capillary: 115 mg/dL — ABNORMAL HIGH (ref 70–99)
Glucose-Capillary: 120 mg/dL — ABNORMAL HIGH (ref 70–99)
Glucose-Capillary: 89 mg/dL (ref 70–99)
Glucose-Capillary: 97 mg/dL (ref 70–99)
Glucose-Capillary: 99 mg/dL (ref 70–99)

## 2019-08-01 MED ORDER — SODIUM ZIRCONIUM CYCLOSILICATE 5 G PO PACK
5.0000 g | PACK | Freq: Once | ORAL | Status: DC
  Filled 2019-08-01: qty 1

## 2019-08-01 MED ORDER — CHLORHEXIDINE GLUCONATE CLOTH 2 % EX PADS: 6.0000 | MEDICATED_PAD | Freq: Every day | CUTANEOUS | Status: DC

## 2019-08-01 MED ORDER — METHYLPREDNISOLONE SODIUM SUCC 40 MG IJ SOLR: 40.0000 mg | Freq: Every day | INTRAMUSCULAR | Status: DC

## 2019-08-01 MED ORDER — FENTANYL CITRATE (PF) 100 MCG/2ML IJ SOLN
50.0000 ug | Freq: Once | INTRAMUSCULAR | Status: AC
  Administered 2019-08-01: 50 ug via INTRAVENOUS

## 2019-08-01 MED ORDER — SODIUM ZIRCONIUM CYCLOSILICATE 10 G PO PACK
10.0000 g | PACK | Freq: Once | ORAL | Status: AC
  Administered 2019-08-01: 10 g via ORAL
  Filled 2019-08-01 (×2): qty 1

## 2019-08-01 NOTE — Progress Notes (Signed)
Pharmacy Antibiotic Note  Joanna Ali is a 69 y.o. female admitted on 07/03/2019 with Oropharangeal candidiasis.  Pharmacy has been consulted for fluconazole dosing (1/9>>).  Pt is newly initiated on HD and plans to continue HD.  Plan: Fluconazole 200 mg q 24 hrs - give after HD on HD days.   Consider a total of 7-14 days (consider 7 days if improvement noted) Will sign off. Please contact pharmacy with any other needs.   Thank you for allowing pharmacy to be a part of this patient's care.  Hildred Laser, PharmD Clinical Pharmacist **Pharmacist phone directory can now be found on [email or web address] ([email or web address]).  Listed under [place].

## 2019-08-01 NOTE — Progress Notes (Signed)
Pulmonary Progress Note  Seen for worsening dyspnea.  S: Called back for dyspnea. Patient feels short of breath similar to when she had effusions before. Occasional cough, clear sputum. Tachypneic but saturating well. CXR with airspace disease vs. Pulmonary edema and bilateral blurring of costophrenic angles  O:  Today's Vitals   07/31/19 2334 08/01/19 0319 08/01/19 0719 08/01/19 1100  BP: (!) 152/103 (!) 140/91  (!) 170/107  Pulse: 95 87  (!) 59  Resp: (!) 22 17  (!) 40  Temp: (!) 97.4 F (36.3 C) (!) 97.2 F (36.2 C) 97.7 F (36.5 C)   TempSrc: Axillary Axillary Axillary   SpO2: 100% 100%    Weight:      Height:      PainSc:   0-No pain    Body mass index is 18.5 kg/m. Ill cachetic woman in NAD Tachypneic, shallow inspirations Bilateral moderate effusions No peripheral edema + muscle wasting Globally weak  A: # Respiratory distress- multifactorial from ongoing third spacing including into pleura, inability to clear secretions, and volume overloaded state of heart # Diffuse anasarca, ESRD, severe protein caloire malnutrition  P: - Will drain both pleural spaces again, no need to send for path - A note that she displays entrapment physiology when too much is drained - Encourage IS, activity as able, nutrition as able - Remainder of care per primary - Will check on her tomorrow  Erskine Emery MD PCCM

## 2019-08-01 NOTE — Progress Notes (Signed)
OP HD referral made to [facility] to request treatment at [place] [facility] clinic for AKI. Renal Navigator will follow closely for acceptance and seat schedule.  Alphonzo Cruise, [place] Renal Navigator [phone]

## 2019-08-01 NOTE — Progress Notes (Addendum)
Patient has been accepted for OP HD treatment at [facility] on a TTS schedule with a seat time of 7:00am. Renal Navigator informed Nephrologist/Dr. Moshe Cipro, CIR AD/C. Cletus Gash and patient's husband. Renal Navigator will continue to follow.  Alphonzo Cruise, [place] Renal Navigator [phone]

## 2019-08-01 NOTE — Progress Notes (Signed)
[facility] Progress Note    Assessment/ Plan:   Pt is a 69 y.o. yo female  with history of HTN, former smoker, hepatic steatosis with low albumin, chronic pericardial effusion, moderate AR admitted on 12/17 for progressive dyspnea, anasarca , seen as a consultationfor AKIand fluid overload.  #Acute kidney injury,nonoliguric (crt normal in Sept, was 1.43 on 07/03/19) : Likely hemodynamically mediated concomitant with use of IV contrast- U/A not remarkable.  Autoimmune work-up unremarkable in the past.   Started HD 07/18/19 -  IR placed [facility] for progressive uremia.  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 Last HD yest 1/11-  Will likely need again on 1/13.  Starting CLIP process for continued HD on the outside  From a renal perspective I think empiric trial of steroids is fine  but there has been no evidence for GN and I don't anticipate any direct benefit or toxicity to the kidneys.  #Hyperkalemia:  Has been having intermittent hyperK.  Try to avoid lokelma as it irritated throat. - now on nepro.  Needs lokelma today unfortunately - steroids are not going to help this issue   #Anasarca presumably due to liver disease which was diagnosed at [facility]. She has a history of liver steatosis.No evidence of GN/nephrotic syndrome. 3rd spacing also. volume status much improved   #Moderate to large pericardial effusion:  s/p pericardial window on 12/23- TCTS following, stable  #Hypertension: Blood pressure acceptable. On low dose Coreg. We need to try and UF more as able . #Anemia: s/p transfusion on 12/22.  gave feraheme and on ESA weekly.  Hb stable in the 9's    #Hyperphosphatemia:  Improved with dialysis.  Not her biggest issue right now-  Given her through issues have not started binder  **Malnutrition:  S/p G tube 1/8 -  Albumin low   Dispo:  She can move to CIR and  cont HD.  Palliative care is involved in [place] discussions. This has been a very long road for her.  Rehab will take once has OP dialysis seat ??    Subjective:   Had HD yest-  Removed 2000-  Felt better with breathing during HD-  Had another attack last night-  Admits that some is anxiety    Objective:   BP (!) 140/91 (BP Location: Left Arm)   Pulse 87   Temp 97.7 F (36.5 C) (Axillary)   Resp 17   Ht 5' 6"  (1.676 m)   Wt 52 kg   SpO2 100%   BMI 18.50 kg/m   Intake/Output Summary (Last 24 hours) at 08/01/2019 0756 Last data filed at 07/31/2019 2337 Gross per 24 hour  Intake --  Output 2050 ml  Net -2050 ml   Weight change:   Physical Exam: Gen: cachectic, fatigued CVS: RRR, well-healing scars Resp: normal WOB Abd: thin, mildly painful to palpation Ext: + flank edema ACCESS: L TDC c/d/i  Imaging: No results found.  Labs: BMET Recent Labs  Lab 07/26/19 0530 07/27/19 0409 07/28/19 0418 07/29/19 0348 07/30/19 0500 07/31/19 0509 08/01/19 0500  NA 130* 128* 134* 133* 130* 130* 132*  K 5.8* 6.8* 5.3* 4.8 5.8* 6.2* 6.0*  CL 91* 90* 92* 92* 92* 91* 93*  CO2 28 25 30 28 27 24 24   GLUCOSE 121* 133* 97 88 140* 153* 141*  BUN 51* 81* 46* 40* 67* 103* 87*  CREATININE 2.77* 3.75* 2.83* 3.11*  4.15* 4.83* 3.94*  CALCIUM 8.3* 8.5* 8.4* 8.4* 8.7* 9.0 8.5*  PHOS 3.5 4.6 4.3 4.8* 7.4* 7.5* 5.8*   CBC Recent Labs  Lab 07/27/19 0410 07/30/19 0500  WBC 11.3* 4.5  HGB 9.4* 9.3*  HCT 32.0* 31.2*  MCV 96.7 95.4  PLT 225 230    Medications:    . carvedilol  3.125 mg Oral BID WC  . Chlorhexidine Gluconate Cloth  6 each Topical Daily  . Chlorhexidine Gluconate Cloth  6 each Topical Q0600  . collagenase   Topical BID  . darbepoetin (ARANESP) injection - NON-DIALYSIS  100 mcg Subcutaneous Q Sat-1800  . diphenhydrAMINE  25 mg Oral Once  . famotidine  10 mg Oral Daily  . feeding supplement (NEPRO CARB STEADY)  1,000 mL Per Tube Q24H  . feeding supplement (PRO-STAT SUGAR  FREE 64)  30 mL Per Tube Daily  . fluconazole  200 mg Oral Daily  . gabapentin  100 mg Oral TID  . Gerhardt's butt cream   Topical TID  . heparin  5,000 Units Subcutaneous Q8H  . loratadine  10 mg Oral Daily  . mouth rinse  15 mL Mouth Rinse BID  . methylPREDNISolone (SOLU-MEDROL) injection  80 mg Intravenous Q12H  . neomycin-bacitracin-polymyxin   Topical BID  . nystatin  5 mL Mouth/Throat QID  . prochlorperazine  5 mg Oral Once  . sertraline  25 mg Oral BID  . sodium chloride flush  3 mL Intravenous Q12H  . sodium chloride flush  3 mL Intravenous Q12H      [facility]  08/01/2019, 7:56 AM

## 2019-08-01 NOTE — Progress Notes (Signed)
  Speech Language Pathology Treatment: Dysphagia  Patient Details Name: Joanna Ali MRN: [id number] DOB: 01/25/1951 Today's Date: 08/01/2019 Time: 4132-4401 SLP Time Calculation (min) (ACUTE ONLY): 11 min  Assessment / Plan / Recommendation Clinical Impression  Patient received in bed. Husband present at bedside. Patient seen right after chest x-ray performed.  Patient and husband both report she has been eating very little by mouth. PEG in place. Patient endorses significant throat pain, she reports it is worse when she swallows liquids, foods or secretions. Pt grimacing after swallowing saliva.  Pt agreeable to drinking some thin liquid (water) via straw. Pt with grossly adequate oral acceptance, hyolaryngeal elevation/excursion present. Pt grimacing/moaning in pain after the swallow. No overt s/s aspiration observed with thin liquids. Pt agreeable to an ice chip. Pt allowing ice chip to melt in oral cavity before initiating to swallow. Again, patient grimacing in pain. No overt s/s aspiration observed with ice chips or thin liquids when seen today. Patient refusing further PO trials d/t pain when swallowing.  Patient currently receiving treatment for thrush of mouth and esophagus. Pharmacy note 1/12 notes plan for give patient Fluconazole for oropharyngeal candidiasis.  At this time, patient's pain with swallowing likely related to thrush. SLP to sign off at this time. Please re-consult ST as thrush resolves, if patient demonstrates any difficulty with swallowing/dysphagia.     HPI HPI: 69 y.o. female  with past medical history of hypertension, moderate aortic regurgitation, chronic pericardial effusion, osteoarthritis, anasarca (secondary to liver disease?), macular degeneration admitted on 07/03/2019 with shortness of breath and tachycardia. Hospitalization complicated by acute kidney injury requiring dialysis. 12/15 bilateral thoracentesis and 12/23 pericardial window. PEG placed 1/8 due to  failure to thrive.       SLP Plan  Discharge SLP treatment due to (comment) patient's pain w/ swallowing likely due to oropharyngeal thrush. Please re-consult ST if she demonstrates dysphagia once thrush is resolved.        Recommendations  Diet recommendations: Regular;Thin liquid;Other(comment)(PEG ) Liquids provided via: Straw Medication Administration: Via alternative means Supervision: Intermittent supervision to cue for compensatory strategies                Oral Care Recommendations: Oral care BID Follow up Recommendations: 24 hour supervision/assistance;Inpatient Rehab SLP Visit Diagnosis: Dysphagia, unspecified (R13.10) Plan: Discharge SLP treatment due to (comment)       Parke, M.Ed., CCC-SLP Speech Therapy Acute Rehabilitation [place] 08/01/2019, 1:35 PM

## 2019-08-01 NOTE — Procedures (Signed)
Thora w/ [place] Note Timeout performed Right chest examined with [place] and skin overlying fluid pocket marked Area prepped and anesthesized with 1% lidocaine 500  cc straw  fluid removed Bandaid applied to site CXR pending No immediate complications

## 2019-08-01 NOTE — Procedures (Signed)
Thoracentesis Procedure Note  Pre-operative Diagnosis: Bilateral pleural effusion   Post-operative Diagnosis: same  Indications: Pleural effusion resulting in dyspnea and pain   Procedure Details  Consent: Informed consent was obtained. Risks of the procedure were discussed including: infection, bleeding, pain, pneumothorax.  Under sterile conditions the patient was positioned. Betadine solution and sterile drapes were utilized. 1% buffered lidocaine was used to anesthetize the appropriate rib space with use of ultrasound to identify fluid pocket. Fluid was obtained without any difficulties and minimal blood loss.  A dressing was applied to the wound and wound care instructions were provided.   Findings 1100 ml of light amber pleural fluid was obtained.  Complications:  None; patient tolerated the procedure well.          Condition: stable  Plan A follow up chest x-ray was ordered. Tylenol 650 mg. for pain.  Johnsie Cancel, NP-C [facility] Contact / Pager information can be found on Amion  08/01/2019, 1:24 PM

## 2019-08-01 NOTE — Progress Notes (Addendum)
PROGRESS NOTE    Joanna Ali  PPG:[id number] DOB: [birth year] DOA: 07/03/2019 PCP: Renaldo Reel, DO   Brief Narrative: 69 year old with past medical history significant for hypertension, chronic pericardial effusion, moderate aortic valve regurgitation, former smoker, alcohol use.  Initially admitted on 12/17 for progressive shortness of breath with anasarca and fatigue.  Previously worked up by [facility] rheumatology with negative work-up including ESR, lupus anticoagulant, rheumatoid factor, TSH, ANCA, ANA and light free chain studies.  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  Since that time patient had stopped drinking alcohol as advised.  She had been on diuretics and spironolactone without any improvement in symptoms.  CT angiogram of the chest on 12/15 noted moderate pericardial effusion and airspace disease.  She underwent thoracentesis by PCCM which removed 700 cc of fluid.  Patient had undergone right heart cath on 12/17 and underwent pericardial window on 12/23.  Attempts were made with IV diuretics but patient developed worsening kidney function with creatinine up to 5.  Nephrology was formally consulted and patient was a started on dialysis.  Patient has been having poor oral intake and she had a core track place. Since last medicine consult note patient has a PEG for persistent poor oral intake and throat pain, on hemodialysis for AKI with rapid progression to end-stage renal disease, severe anasarca and pericardial effusion status post window.  She has been recently started on empiric steroids.   Assessment & Plan:   Principal Problem:   Anasarca Active Problems:   Pericardial effusion   Hypertension   Elevated troponin   Atypical pneumonia   Hypoxia   Protein-calorie malnutrition, severe   Pressure injury of skin   Failure to  thrive in adult   Goals of care, counseling/discussion   Dysphagia   Muscular weakness   Thrush of mouth and esophagus (HCC)   ESRD on hemodialysis ([place])  1-Anasarca;  -Thought to be related to Chronic ETOH use and Hepatic Steatosis.  -Continue with HD, and nutrition support.  -Started on IV steroid to treat empirically in case Rheumatology etiology. She has had previous unrevealing Rheumatology evaluation.  -I have order labs recommended by Dr Hilma Favors, as miscellaneous; Urine and blood Histamine level.   2-ESRD new on HD;  Progressive AKI, found to be exacerbated by contrast-induced nephropathy. Started on hemodialysis, with improvement of anasarca. Awaiting Clip Process.   Acute Hypoxic Respiratory Failure; Pulmonary edema, pleural effusion; Complaints of worsening dyspnea.  Chest x ray worsening left pleural effusion/. Pulmonary edema, infiltrates. CCM consulted.  She was started on IV solumedrol by palliative for presume autoimmune process ?? Patient has been more anxious since them. I will decreased dose.   3-Pericardial effusion with elevated troponin;  -Status post pericardial window on 12/23, under care of cardiology and CT surgery.   -She had a pericardial effusion as well as pleural effusion that are thought to be stable at this point in time. -Report Dyspnea. Will check Chest x ray/.   4-Hyperkalemia;  Will received Lokelma.  Repeat B-met this afternoon.   5-Failure to Thrive, Severe malnutrition;  -Continue with tube feeding.  6-Atypical PNA with Hypoxia; Completed treatment.   7-Dysphagia Thrush;  On Diflucan.  On tube feeding.  Pepcid.   8-Stage 2 pressure Ulcer;  Continue with local care.   9-Anxiety;  Continue with Zoloft.  Started Ativan.   10-Back pain;  Continue with  fentanyl, oxycodone.   11-HTN;  Continue with coreg.   Hypernatremia; corrected with HD.    Pressure Injury 07/19/19 Sacrum Posterior;Mid Stage 2 -  Partial thickness loss of  dermis presenting as a shallow open injury with a red, pink wound bed without slough. (Active)  07/19/19 0600  Location: Sacrum  Location Orientation: Posterior;Mid  Staging: Stage 2 -  Partial thickness loss of dermis presenting as a shallow open injury with a red, pink wound bed without slough.  Wound Description (Comments):   Present on Admission: No     Pressure Injury 07/22/19 Nare Right;Left Stage 2 -  Partial thickness loss of dermis presenting as a shallow open injury with a red, pink wound bed without slough. Injury r/t cortrak (Active)  07/22/19 1000  Location: Nare  Location Orientation: Right;Left  Staging: Stage 2 -  Partial thickness loss of dermis presenting as a shallow open injury with a red, pink wound bed without slough.  Wound Description (Comments): Injury r/t cortrak  Present on Admission: No     Nutrition Problem: Severe Malnutrition Etiology: chronic illness(pericardial effusion w/ volume overload)    Signs/Symptoms: moderate fat depletion, severe muscle depletion, percent weight loss    Interventions: Ensure Enlive (each supplement provides 350kcal and 20 grams of protein), Tube feeding  Estimated body mass index is 18.5 kg/m as calculated from the following:   Height as of this encounter: 5' 6"  (1.676 m).   Weight as of this encounter: 52 kg.   DVT prophylaxis: Heparin Code Status: full code Family Communication: care discussed with patient Disposition Plan: Transfer to CIR  Consultants:   Cardiology  CCM  CVTS  Palliative  Nephrology  Procedures:   HD  Pericardial window  Antimicrobials:  None currently   Subjective: Patient is anxious, report she cant breath. After positive reinforce , she calm down and feels better. Oxygen sat remain normal.   Objective: Vitals:   07/31/19 1931 07/31/19 2334 08/01/19 0319 08/01/19 0719  BP: (!) 144/92 (!) 152/103 (!) 140/91   Pulse: 87 95 87   Resp: 15 (!) 22 17   Temp: 97.7 F (36.5  C) (!) 97.4 F (36.3 C) (!) 97.2 F (36.2 C) 97.7 F (36.5 C)  TempSrc: Axillary Axillary Axillary Axillary  SpO2: 100% 100% 100%   Weight:      Height:        Intake/Output Summary (Last 24 hours) at 08/01/2019 0831 Last data filed at 07/31/2019 2337 Gross per 24 hour  Intake --  Output 2050 ml  Net -2050 ml   Filed Weights   07/30/19 0230 07/31/19 0900 07/31/19 1140  Weight: 52.1 kg 54.2 kg 52 kg    Examination:  General exam: Anxious Respiratory system: Tachypnea, crackles bases Cardiovascular system: S1 & S2 heard, RRR. No JVD,  Systolic murmurs, rubs, gallops or clicks.  Gastrointestinal system: Abdomen is nondistended, soft and nontender. No organomegaly or masses felt. Normal bowel sounds heard. Central nervous system: Alert and oriented.  Extremities: Symmetric 5 x 5 power. Skin: No rashes, lesions or ulcers   Data Reviewed: I have personally reviewed following labs and imaging studies  CBC: Recent Labs  Lab 07/27/19 0410 07/30/19 0500  WBC 11.3* 4.5  HGB 9.4* 9.3*  HCT 32.0* 31.2*  MCV 96.7 95.4  PLT 225 419   Basic Metabolic Panel: Recent Labs  Lab 07/28/19 0418 07/29/19 0348 07/30/19 0500 07/31/19 0509 08/01/19 0500  NA 134* 133* 130* 130* 132*  K 5.3* 4.8 5.8* 6.2* 6.0*  CL 92* 92* 92* 91* 93*  CO2 30 28 27 24 24   GLUCOSE 97 88 140* 153* 141*  BUN 46* 40* 67* 103* 87*  CREATININE 2.83* 3.11* 4.15* 4.83* 3.94*  CALCIUM 8.4* 8.4* 8.7* 9.0 8.5*  PHOS 4.3 4.8* 7.4* 7.5* 5.8*   GFR: Estimated Creatinine Clearance: 10.9 mL/min (A) (by C-G formula based on SCr of 3.94 mg/dL (H)). Liver Function Tests: Recent Labs  Lab 07/28/19 0418 07/29/19 0348 07/30/19 0500 07/31/19 0509 08/01/19 0500  ALBUMIN 2.3* 2.3* 2.3* 2.5* 2.5*   No results for input(s): LIPASE, AMYLASE in the last 168 hours. No results for input(s): AMMONIA in the last 168 hours. Coagulation Profile: Recent Labs  Lab 07/27/19 1400  INR 1.1   Cardiac Enzymes: No results  for input(s): CKTOTAL, CKMB, CKMBINDEX, TROPONINI in the last 168 hours. BNP (last 3 results) No results for input(s): PROBNP in the last 8760 hours. HbA1C: No results for input(s): HGBA1C in the last 72 hours. CBG: Recent Labs  Lab 07/31/19 1552 07/31/19 1931 07/31/19 2335 08/01/19 0316 08/01/19 0723  GLUCAP 110* 95 145* 97 115*   Lipid Profile: No results for input(s): CHOL, HDL, LDLCALC, TRIG, CHOLHDL, LDLDIRECT in the last 72 hours. Thyroid Function Tests: No results for input(s): TSH, T4TOTAL, FREET4, T3FREE, THYROIDAB in the last 72 hours. Anemia Panel: No results for input(s): VITAMINB12, FOLATE, FERRITIN, TIBC, IRON, RETICCTPCT in the last 72 hours. Sepsis Labs: No results for input(s): PROCALCITON, LATICACIDVEN in the last 168 hours.  Recent Results (from the past 240 hour(s))  Culture, group A strep     Status: None   Collection Time: 07/26/19 12:21 PM   Specimen: Throat  Result Value Ref Range Status   Specimen Description THROAT  Final   Special Requests NONE  Final   Culture   Final    NO GROUP A STREP (S.PYOGENES) ISOLATED Performed at [facility], [address] [place], [place] [address]    Report Status 07/28/2019 FINAL  Final         Radiology Studies: No results found.      Scheduled Meds: . carvedilol  3.125 mg Oral BID WC  . Chlorhexidine Gluconate Cloth  6 each Topical Daily  . Chlorhexidine Gluconate Cloth  6 each Topical Q0600  . collagenase   Topical BID  . darbepoetin (ARANESP) injection - NON-DIALYSIS  100 mcg Subcutaneous Q Sat-1800  . diphenhydrAMINE  25 mg Oral Once  . famotidine  10 mg Oral Daily  . feeding supplement (NEPRO CARB STEADY)  1,000 mL Per Tube Q24H  . feeding supplement (PRO-STAT SUGAR FREE 64)  30 mL Per Tube Daily  . fluconazole  200 mg Oral Daily  . gabapentin  100 mg Oral TID  . Gerhardt's butt cream   Topical TID  . heparin  5,000 Units Subcutaneous Q8H  . loratadine  10 mg Oral Daily  . mouth  rinse  15 mL Mouth Rinse BID  . methylPREDNISolone (SOLU-MEDROL) injection  80 mg Intravenous Q12H  . neomycin-bacitracin-polymyxin   Topical BID  . nystatin  5 mL Mouth/Throat QID  . prochlorperazine  5 mg Oral Once  . sertraline  25 mg Oral BID  . sodium chloride flush  3 mL Intravenous Q12H  . sodium chloride flush  3 mL Intravenous Q12H  . sodium zirconium cyclosilicate  10 g Oral Once   Continuous Infusions: . sodium chloride 500 mL (07/06/19 1043)  . sodium chloride    . sodium chloride    .  sodium chloride    . dextrose Stopped (07/29/19 1707)     LOS: 28 days    Time spent: 35 minutes    Lekendrick Alpern A Nykira Reddix, MD [facility]   If 7PM-7AM, please contact night-coverage [email or web address] Password [facility] 08/01/2019, 8:31 AM

## 2019-08-02 ENCOUNTER — Inpatient Hospital Stay (HOSPITAL_COMMUNITY): Payer: Medicare Other

## 2019-08-02 LAB — RENAL FUNCTION PANEL
Albumin: 2.5 g/dL — ABNORMAL LOW (ref 3.5–5.0)
Anion gap: 15 (ref 5–15)
BUN: 121 mg/dL — ABNORMAL HIGH (ref 8–23)
CO2: 25 mmol/L (ref 22–32)
Calcium: 8.9 mg/dL (ref 8.9–10.3)
Chloride: 92 mmol/L — ABNORMAL LOW (ref 98–111)
Creatinine, Ser: 4.63 mg/dL — ABNORMAL HIGH (ref 0.44–1.00)
GFR calc Af Amer: 10 mL/min — ABNORMAL LOW (ref >60)
GFR calc non Af Amer: 9 mL/min — ABNORMAL LOW (ref >60)
Glucose, Bld: 124 mg/dL — ABNORMAL HIGH (ref 70–99)
Phosphorus: 7 mg/dL — ABNORMAL HIGH (ref 2.5–4.6)
Potassium: 6.1 mmol/L — ABNORMAL HIGH (ref 3.5–5.1)
Sodium: 132 mmol/L — ABNORMAL LOW (ref 135–145)

## 2019-08-02 LAB — BLOOD GAS, ARTERIAL
Acid-Base Excess: 0.3 mmol/L (ref 0.0–2.0)
Bicarbonate: 24.3 mmol/L (ref 20.0–28.0)
Drawn by: 560031
FIO2: 28
O2 Saturation: 94.9 %
Patient temperature: 37
pCO2 arterial: 38.3 mmHg (ref 32.0–48.0)
pH, Arterial: 7.418 (ref 7.350–7.450)
pO2, Arterial: 76.6 mmHg — ABNORMAL LOW (ref 83.0–108.0)

## 2019-08-02 LAB — GLUCOSE, CAPILLARY
Glucose-Capillary: 101 mg/dL — ABNORMAL HIGH (ref 70–99)
Glucose-Capillary: 126 mg/dL — ABNORMAL HIGH (ref 70–99)
Glucose-Capillary: 133 mg/dL — ABNORMAL HIGH (ref 70–99)
Glucose-Capillary: 87 mg/dL (ref 70–99)
Glucose-Capillary: 98 mg/dL (ref 70–99)

## 2019-08-02 LAB — CBC
HCT: 32.1 % — ABNORMAL LOW (ref 36.0–46.0)
Hemoglobin: 9.9 g/dL — ABNORMAL LOW (ref 12.0–15.0)
MCH: 29.8 pg (ref 26.0–34.0)
MCHC: 30.8 g/dL (ref 30.0–36.0)
MCV: 96.7 fL (ref 80.0–100.0)
Platelets: 285 10*3/uL (ref 150–400)
RBC: 3.32 MIL/uL — ABNORMAL LOW (ref 3.87–5.11)
RDW: 19.1 % — ABNORMAL HIGH (ref 11.5–15.5)
WBC: 9.3 10*3/uL (ref 4.0–10.5)
nRBC: 0.6 % — ABNORMAL HIGH (ref 0.0–0.2)

## 2019-08-02 LAB — MISC LABCORP TEST (SEND OUT): Labcorp test code: 144600

## 2019-08-02 LAB — TSH: TSH: 5.768 u[IU]/mL — ABNORMAL HIGH (ref 0.350–4.500)

## 2019-08-02 LAB — T4, FREE: Free T4: 0.89 ng/dL (ref 0.61–1.12)

## 2019-08-02 MED ORDER — METHYLPREDNISOLONE SODIUM SUCC 40 MG IJ SOLR
20.0000 mg | Freq: Every day | INTRAMUSCULAR | Status: DC
  Administered 2019-08-02 – 2019-08-03 (×2): 20 mg via INTRAVENOUS
  Filled 2019-08-02 (×2): qty 1

## 2019-08-02 MED ORDER — HEPARIN SODIUM (PORCINE) 1000 UNIT/ML IJ SOLN
INTRAMUSCULAR | Status: AC
  Filled 2019-08-02: qty 4

## 2019-08-02 MED ORDER — FENTANYL CITRATE (PF) 100 MCG/2ML IJ SOLN: 12.5000 ug | INTRAMUSCULAR | Status: DC | PRN

## 2019-08-02 MED ORDER — OXYCODONE HCL 5 MG PO TABS
5.0000 mg | ORAL_TABLET | Freq: Three times a day (TID) | ORAL | Status: DC | PRN
  Administered 2019-08-05 – 2019-08-22 (×11): 5 mg via ORAL
  Filled 2019-08-02 (×11): qty 1

## 2019-08-02 MED ORDER — METHYLPREDNISOLONE SODIUM SUCC 125 MG IJ SOLR: 40.0000 mg | Freq: Every day | INTRAMUSCULAR | Status: DC

## 2019-08-02 MED ORDER — LORAZEPAM 2 MG/ML IJ SOLN
INTRAMUSCULAR | Status: AC
  Administered 2019-08-02: 1 mg via INTRAVENOUS
  Filled 2019-08-02: qty 1

## 2019-08-02 NOTE — Progress Notes (Signed)
OT Cancellation Note  Patient Details Name: Joanna Ali MRN: [id number] DOB: Jul 01, 1951   Cancelled Treatment:    Reason Eval/Treat Not Completed: Patient at procedure or test/ unavailable(HD)  [name] 08/02/2019, 8:13 AM  [name], OTR/L Acute Rehabilitation Services Pager: [phone] Office: [phone]

## 2019-08-02 NOTE — Procedures (Signed)
Patient was seen on dialysis and the procedure was supervised.  BFR 400  Via TDC BP is  140/87.   Patient appears to be tolerating treatment well  Louis Meckel 08/02/2019

## 2019-08-02 NOTE — Progress Notes (Signed)
PROGRESS NOTE    Joanna Ali  GUY:[id number] DOB: 1951/04/03 DOA: 07/03/2019 PCP: Renaldo Reel, DO   Brief Narrative: 69 year old with past medical history significant for hypertension, chronic pericardial effusion, moderate aortic valve regurgitation, former smoker, alcohol use.  Initially admitted on 12/17 for progressive shortness of breath with anasarca and fatigue.  Previously worked up by [facility] rheumatology with negative work-up including ESR, lupus anticoagulant, rheumatoid factor, TSH, ANCA, ANA and light free chain studies.  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  Since that time patient had stopped drinking alcohol as advised.  She had been on diuretics and spironolactone without any improvement in symptoms.  CT angiogram of the chest on 12/15 noted moderate pericardial effusion and airspace disease.  She underwent thoracentesis by PCCM which removed 700 cc of fluid.  Patient had undergone right heart cath on 12/17 and underwent pericardial window on 12/23.  Attempts were made with IV diuretics but patient developed worsening kidney function with creatinine up to 5.  Nephrology was formally consulted and patient was a started on dialysis.  Patient has been having poor oral intake and she had a core track place. Since last medicine consult note patient has a PEG for persistent poor oral intake and throat pain, on hemodialysis for AKI with rapid progression to end-stage renal disease, severe anasarca and pericardial effusion status post window.  She has been recently started on empiric steroids.   Assessment & Plan:   Principal Problem:   Anasarca Active Problems:   Pericardial effusion   Hypertension   Elevated troponin   Atypical pneumonia   Hypoxia   Protein-calorie malnutrition, severe   Pressure injury of skin   Failure to  thrive in adult   Goals of care, counseling/discussion   Dysphagia   Muscular weakness   Thrush of mouth and esophagus (HCC)   ESRD on hemodialysis (HCC)   Dyspnea   S/P thoracentesis  1-Anasarca;  -Thought to be related to Chronic ETOH use and Hepatic Steatosis.  -Continue with HD, and nutrition support.  -Started on IV steroid to treat empirically in case Rheumatology etiology. She has had previous unrevealing Rheumatology evaluation.  -I have order labs recommended by Dr Hilma Favors, as miscellaneous; Urine and blood Histamine level.  -Reduce solumedrol to 40 mg IV daily to help reduce anxiety.   2-ESRD new on HD;  Progressive AKI, found to be exacerbated by contrast-induced nephropathy. Started on hemodialysis, with improvement of anasarca. Awaiting Clip Process.   3-Acute AMS, Lethargic, Encephalopathy;  Patient became lethargic in HD, 1-13 Likely related to medication ativan. She got a dose during HD>  Will limit sedatives. Stop fentanyl, decrease opioids. Stop ativan.  ABG PCO2 not elevated.   4-Acute Hypoxic Respiratory Failure; Pulmonary edema, pleural effusion; Complaints of worsening dyspnea.  Chest x ray worsening left pleural effusion/. Pulmonary edema, infiltrates. CCM consulted.  She was started on IV solumedrol by palliative for presume autoimmune process ?? Patient has been more anxious since them. I will decreased dose solumedrol.  Worsening Dyspnea, chest x ray 1-12; worsening pleural effusion. Underwent repeated Thoracentesis 1-12; 1.1 L Left  and 500 cc from right.   3-Pericardial effusion with elevated troponin;  -Status post pericardial window on 12/23, under care of cardiology and CT surgery.   -She had a pericardial effusion as well as pleural effusion that are thought to be stable at this  point in time.   4-Hyperkalemia;  Will received Lokelma.  Had HD today.   5-Failure to Thrive, Severe malnutrition;  -Continue with tube feeding.  6-Atypical PNA  with Hypoxia; Completed treatment.   7-Dysphagia Thrush;  On Diflucan.  On tube feeding.  Pepcid.   8-Stage 2 pressure Ulcer;  Continue with local care.   9-Anxiety;  Continue with Zoloft.  Started Ativan.   10-Back pain;  Continue with fentanyl, oxycodone.   11-HTN;  Continue with coreg.   Hypernatremia; corrected with HD.    Pressure Injury 07/19/19 Sacrum Posterior;Mid Stage 2 -  Partial thickness loss of dermis presenting as a shallow open injury with a red, pink wound bed without slough. (Active)  07/19/19 0600  Location: Sacrum  Location Orientation: Posterior;Mid  Staging: Stage 2 -  Partial thickness loss of dermis presenting as a shallow open injury with a red, pink wound bed without slough.  Wound Description (Comments):   Present on Admission: No     Pressure Injury 07/22/19 Nare Right;Left Stage 2 -  Partial thickness loss of dermis presenting as a shallow open injury with a red, pink wound bed without slough. Injury r/t cortrak (Active)  07/22/19 1000  Location: Nare  Location Orientation: Right;Left  Staging: Stage 2 -  Partial thickness loss of dermis presenting as a shallow open injury with a red, pink wound bed without slough.  Wound Description (Comments): Injury r/t cortrak  Present on Admission: No     Nutrition Problem: Severe Malnutrition Etiology: chronic illness(pericardial effusion w/ volume overload)    Signs/Symptoms: moderate fat depletion, severe muscle depletion, percent weight loss    Interventions: Ensure Enlive (each supplement provides 350kcal and 20 grams of protein), Tube feeding  Estimated body mass index is 18.89 kg/m as calculated from the following:   Height as of this encounter: 5' 6"  (1.676 m).   Weight as of this encounter: 53.1 kg.   DVT prophylaxis: Heparin Code Status: full code Family Communication: care discussed with patient Disposition Plan: Transfer to CIR  Consultants:   Cardiology  CCM  CVTS   Palliative  Nephrology  Procedures:   HD  Pericardial window  Antimicrobials:  None currently   Subjective: Lethargic, waking up., following some command.    Objective: Vitals:   08/02/19 0930 08/02/19 1000 08/02/19 1020 08/02/19 1040  BP: (!) 83/50 (!) 84/50 114/67 (!) 133/91  Pulse: (!) 104 (!) 106 (!) 101 97  Resp:   (!) 25   Temp:      TempSrc:      SpO2:   (!) 70% 100%  Weight:      Height:        Intake/Output Summary (Last 24 hours) at 08/02/2019 1120 Last data filed at 08/02/2019 1020 Gross per 24 hour  Intake 3 ml  Output 2106 ml  Net -2103 ml   Filed Weights   07/31/19 0900 07/31/19 1140 08/02/19 0740  Weight: 54.2 kg 52 kg 53.1 kg    Examination:  General exam: Lethargic Respiratory system: Crackles bases Cardiovascular system: S 1, S 2 RRR Gastrointestinal system: BS present, soft, nt Central nervous system: lethargic, , eyes open, follows some command Extremities: Symmetric power   Data Reviewed: I have personally reviewed following labs and imaging studies  CBC: Recent Labs  Lab 07/27/19 0410 07/30/19 0500 08/02/19 0700  WBC 11.3* 4.5 9.3  HGB 9.4* 9.3* 9.9*  HCT 32.0* 31.2* 32.1*  MCV 96.7 95.4 96.7  PLT 225 230 768   Basic Metabolic  Panel: Recent Labs  Lab 07/29/19 0348 07/30/19 0500 07/31/19 0509 08/01/19 0500 08/01/19 1455 08/02/19 0413  NA 133* 130* 130* 132* 131* 132*  K 4.8 5.8* 6.2* 6.0* 5.4* 6.1*  CL 92* 92* 91* 93* 89* 92*  CO2 28 27 24 24 23 25   GLUCOSE 88 140* 153* 141* 137* 124*  BUN 40* 67* 103* 87* 103* 121*  CREATININE 3.11* 4.15* 4.83* 3.94* 4.20* 4.63*  CALCIUM 8.4* 8.7* 9.0 8.5* 8.7* 8.9  PHOS 4.8* 7.4* 7.5* 5.8*  --  7.0*   GFR: Estimated Creatinine Clearance: 9.5 mL/min (A) (by C-G formula based on SCr of 4.63 mg/dL (H)). Liver Function Tests: Recent Labs  Lab 07/29/19 0348 07/30/19 0500 07/31/19 0509 08/01/19 0500 08/02/19 0413  ALBUMIN 2.3* 2.3* 2.5* 2.5* 2.5*   No results for input(s):  LIPASE, AMYLASE in the last 168 hours. No results for input(s): AMMONIA in the last 168 hours. Coagulation Profile: Recent Labs  Lab 07/27/19 1400  INR 1.1   Cardiac Enzymes: No results for input(s): CKTOTAL, CKMB, CKMBINDEX, TROPONINI in the last 168 hours. BNP (last 3 results) No results for input(s): PROBNP in the last 8760 hours. HbA1C: No results for input(s): HGBA1C in the last 72 hours. CBG: Recent Labs  Lab 08/01/19 1836 08/01/19 1923 08/01/19 2332 08/02/19 0348 08/02/19 1050  GLUCAP 99 109* 89 87 126*   Lipid Profile: No results for input(s): CHOL, HDL, LDLCALC, TRIG, CHOLHDL, LDLDIRECT in the last 72 hours. Thyroid Function Tests: No results for input(s): TSH, T4TOTAL, FREET4, T3FREE, THYROIDAB in the last 72 hours. Anemia Panel: No results for input(s): VITAMINB12, FOLATE, FERRITIN, TIBC, IRON, RETICCTPCT in the last 72 hours. Sepsis Labs: No results for input(s): PROCALCITON, LATICACIDVEN in the last 168 hours.  Recent Results (from the past 240 hour(s))  Culture, group A strep     Status: None   Collection Time: 07/26/19 12:21 PM   Specimen: Throat  Result Value Ref Range Status   Specimen Description THROAT  Final   Special Requests NONE  Final   Culture   Final    NO GROUP A STREP (S.PYOGENES) ISOLATED Performed at [facility], [address] [place], [place] [address]    Report Status 07/28/2019 FINAL  Final         Radiology Studies: DG CHEST PORT 1 VIEW  Result Date: 08/01/2019 CLINICAL DATA:  Post thoracentesis bilateral EXAM: PORTABLE CHEST 1 VIEW COMPARISON:  08/01/2019 FINDINGS: Marked improvement in bilateral pleural effusion post thoracentesis. No pneumothorax Improvement in diffuse bilateral airspace disease compatible with edema. Mild residual edema. Bilateral central venous catheters unchanged. IMPRESSION: Marked improvement in aeration with decreased edema and decreased pleural effusions. No pneumothorax. Electronically Signed    By: Franchot Gallo M.D.   On: 08/01/2019 13:52   DG CHEST PORT 1 VIEW  Result Date: 08/01/2019 CLINICAL DATA:  Episodes of shortness of breath and congestion for 2 days EXAM: PORTABLE CHEST 1 VIEW COMPARISON:  4 days ago FINDINGS: Bilateral airspace disease asymmetric to the left where there is pleural effusion seen along the lateral chest wall. Chronic cardiopericardial enlargement. Bilateral central line with tips at the upper cavoatrial junction. IMPRESSION: Extensive airspace disease and sizable left pleural effusion that is more readily seen than on prior. Findings could reflect infection or CHF. Electronically Signed   By: Monte Fantasia M.D.   On: 08/01/2019 11:46        Scheduled Meds: . carvedilol  3.125 mg Oral BID WC  . Chlorhexidine Gluconate  Cloth  6 each Topical [id number]  . collagenase   Topical BID  . darbepoetin (ARANESP) injection - NON-DIALYSIS  100 mcg Subcutaneous Q Sat-1800  . diphenhydrAMINE  25 mg Oral Once  . famotidine  10 mg Oral Daily  . feeding supplement (NEPRO CARB STEADY)  1,000 mL Per Tube Q24H  . feeding supplement (PRO-STAT SUGAR FREE 64)  30 mL Per Tube Daily  . fluconazole  200 mg Oral Daily  . gabapentin  100 mg Oral TID  . Gerhardt's butt cream   Topical TID  . heparin      . heparin  5,000 Units Subcutaneous Q8H  . loratadine  10 mg Oral Daily  . mouth rinse  15 mL Mouth Rinse BID  . neomycin-bacitracin-polymyxin   Topical BID  . nystatin  5 mL Mouth/Throat QID  . prochlorperazine  5 mg Oral Once  . sertraline  25 mg Oral BID  . sodium chloride flush  3 mL Intravenous Q12H  . sodium chloride flush  3 mL Intravenous Q12H  . sodium zirconium cyclosilicate  5 g Oral Once   Continuous Infusions: . sodium chloride 500 mL (07/06/19 1043)  . sodium chloride    . sodium chloride    . sodium chloride    . dextrose Stopped (07/29/19 1707)     LOS: 29 days    Time spent: 35 minutes    Belkys A Regalado, MD [facility]   If  7PM-7AM, please contact night-coverage [email or web address] Password [facility] 08/02/2019, 11:20 AM

## 2019-08-02 NOTE — Significant Event (Addendum)
Rapid Response Event Note  Overview: Time Called: 9450 Arrival Time: 1045 Event Type: Neurologic(lethargic following Ativan administration) Pt in HD unit for treatment. Had approximately 2L removed per HD RN. HD treatment now completed. Pt received 101m IV Ativan at 0458 and 0920 for yelling, agitation, restlessness. Pt lethargic, diaphoretic, clammy and oxygen saturation fluctuating between 60-70% on 3LNC and not sustaining a reading.  Initial Focused Assessment: Pt eyes closed, minimally responsive to voice. With physical stimuli pt is able to follow commands and squeeze with both hands. Pt quickly falls back asleep without stimulation. Pt cool and clammy to touch. Nail beds blue and dusky. Pt received Ativan at 0920 for agitation. Pulse ox monitor placed on pt forehead and reading 100% on 3LNC. All other VSS. Pt transported back to her room  2C01.   Dr. RTyrell Antoniopaged and came to bedside to assess Mrs. KShiffer ABG ordered by MD. MD at bedside and reviewed results. Symptoms greatly improved. Pt spontaneously opening her eyes. Following commands and moving all extremities. Pt attempting to communicate with husband and staff, but whisper is unintelligible. Vital signs remain stable.  Interventions: CBG 126 Dr. RTyrell Antonioat bedside to assess pt.  ABG (7.41/38.3/76.6/24.3)  Plan of Care (if not transferred): -Dr. RTyrell Antoniomodified pt's analgesic and benzo medication dosages.  -Pt husband at bedside and made aware of lethargic event that occurred while pt was in HD. -Mrs. Alipio, her husband, and nursing staff educated on maintaining an appropriate sleep/wake cycle to help with delirium and to attempt non-pharmacological means to reduce anxiety and pain.  Event Summary: Name of Physician Notified: Dr. BRudolpho Sevinat 1050    at    Outcome: Stayed in room and stabalized  Event End Time: [place]

## 2019-08-02 NOTE — Progress Notes (Signed)
At conclusion of HD tx, pt noted with O2 sats fluctuating between 60-70% on 3liters of O2. Pt looks at this nurse when name called, however is nonverbal at present. Pt diaphoretic and clammy. Able to mouth that she feels "tired". Pt noted to be very lethargic, received ativan 37m IV during HD tx at 0920 due to patient presenting with increased anxiousness and restlessness, yelling out continuously. Dr. GMoshe Ciproon floor to assess, with recommendations to given prn dose. Pt noted with minimal effectiveness of ativan throughout most of tx.

## 2019-08-02 NOTE — Progress Notes (Addendum)
RN received call from [place], RN in dialysis stating rapid response at bedside with pt. Patient was hypertensive on arrival to HD. Approximately 30 mins after arrival began screaming and yelling. Pt was given Ativan at 0920. After dialysis pt became lethargic and could not be aroused. RN at bedside along with rapid response and Dr. Tyrell Antonio. Patient still lethargic but does respond to voice  speaks in very low voice. Nods to questions appropriately. Pupils reacting. Per rapid response nurse pt more alert than when eyes first put on pt in HD. RN will continue to monitor.

## 2019-08-02 NOTE — Progress Notes (Signed)
[facility] Progress Note    Assessment/ Plan:   Pt is a 69 y.o. yo female  with history of HTN,  hepatic steatosis with low albumin, chronic pericardial effusion, moderate AR admitted on 12/17 for progressive dyspnea, anasarca , seen as a consultationfor AKIand fluid overload.  #Acute kidney injury,nonoliguric (crt normal in Sept, was 1.43 on 07/03/19) : Likely hemodynamically mediated, possible HRS concomitant with use of IV contrast- U/A not remarkable.  Autoimmune work-up unremarkable in the past.   Started HD 07/18/19 -  IR placed TDC.  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 HD today.  Starting CLIP process for continued HD on the outside.  Based on how she looks today I really dont think she is a great candidate for OP HD.  Tentatively has a spot in [place].  Next HD is planned for 1/15-- will likely need longer treatment given steroids and volume   From a renal perspective I think empiric trial of steroids is fine  but there has been no evidence for GN and I don't anticipate any direct benefit or toxicity to the kidneys.  I now am worried that the steroids is making her anxiety worse  #Hyperkalemia:  Has been having intermittent hyperK.  Try to avoid lokelma as it irritated throat. - now on nepro.  Needed lokelma yesterday unfortunately - steroids are not going to help this issue   #Anasarca presumably due to liver disease which was diagnosed at [facility]. She has a history of liver steatosis.No evidence of GN/nephrotic syndrome. 3rd spacing also. volume status much improved but is still an issue   #Moderate to large pericardial effusion:  s/p pericardial window on 12/23- TCTS following, stable  #Hypertension: Blood pressure acceptable. On low dose Coreg. We need to try and UF more as able . #Anemia: s/p transfusion on 12/22.  gave feraheme and on ESA weekly.   Hb stable in the 9's    #Hyperphosphatemia:  Improved with dialysis.  Not her biggest issue right now-  Given her throat issues have not started binder  **Malnutrition:  S/p G tube 1/8 -  Albumin low   Dispo:  She can move to CIR and cont HD.  Palliative care is involved in [place] discussions. This has been a very long road for her.  Rehab will take once has OP dialysis seat ?? She does not look today appropriate for rehab-  May need to revisit [facility]     Subjective:    Seen on HD -  She looks terrible -  Yelling out- not answering direct questions.  Given ativan this AM-  Needs again     Objective:   BP 140/87   Pulse (!) 103   Temp 97.9 F (36.6 C) (Axillary)   Resp (!) 26   Ht 5' 6"  (1.676 m)   Wt 53.1 kg   SpO2 92%   BMI 18.89 kg/m   Intake/Output Summary (Last 24 hours) at 08/02/2019 8325 Last data filed at 08/01/2019 2134 Gross per 24 hour  Intake 63 ml  Output 0 ml  Net 63 ml   Weight change:   Physical Exam: Gen: cachectic, fatigued CVS: RRR, well-healing scars Resp: normal WOB Abd: thin, mildly painful to palpation Ext: + flank edema ACCESS: L TDC c/d/i  Imaging: DG CHEST PORT 1 VIEW  Result Date: 08/01/2019 CLINICAL DATA:  Post thoracentesis bilateral EXAM: PORTABLE CHEST 1 VIEW  COMPARISON:  08/01/2019 FINDINGS: Marked improvement in bilateral pleural effusion post thoracentesis. No pneumothorax Improvement in diffuse bilateral airspace disease compatible with edema. Mild residual edema. Bilateral central venous catheters unchanged. IMPRESSION: Marked improvement in aeration with decreased edema and decreased pleural effusions. No pneumothorax. Electronically Signed   By: Franchot Gallo M.D.   On: 08/01/2019 13:52   DG CHEST PORT 1 VIEW  Result Date: 08/01/2019 CLINICAL DATA:  Episodes of shortness of breath and congestion for 2 days EXAM: PORTABLE CHEST 1 VIEW COMPARISON:  4 days ago FINDINGS: Bilateral airspace disease asymmetric to the left where there is  pleural effusion seen along the lateral chest wall. Chronic cardiopericardial enlargement. Bilateral central line with tips at the upper cavoatrial junction. IMPRESSION: Extensive airspace disease and sizable left pleural effusion that is more readily seen than on prior. Findings could reflect infection or CHF. Electronically Signed   By: Monte Fantasia M.D.   On: 08/01/2019 11:46    Labs: BMET Recent Labs  Lab 07/27/19 0409 07/28/19 0418 07/29/19 0348 07/30/19 0500 07/31/19 0509 08/01/19 0500 08/01/19 1455 08/02/19 0413  NA 128* 134* 133* 130* 130* 132* 131* 132*  K 6.8* 5.3* 4.8 5.8* 6.2* 6.0* 5.4* 6.1*  CL 90* 92* 92* 92* 91* 93* 89* 92*  CO2 25 30 28 27 24 24 23 25   GLUCOSE 133* 97 88 140* 153* 141* 137* 124*  BUN 81* 46* 40* 67* 103* 87* 103* 121*  CREATININE 3.75* 2.83* 3.11* 4.15* 4.83* 3.94* 4.20* 4.63*  CALCIUM 8.5* 8.4* 8.4* 8.7* 9.0 8.5* 8.7* 8.9  PHOS 4.6 4.3 4.8* 7.4* 7.5* 5.8*  --  7.0*   CBC Recent Labs  Lab 07/27/19 0410 07/30/19 0500 08/02/19 0700  WBC 11.3* 4.5 9.3  HGB 9.4* 9.3* 9.9*  HCT 32.0* 31.2* 32.1*  MCV 96.7 95.4 96.7  PLT 225 230 285    Medications:    . carvedilol  3.125 mg Oral BID WC  . Chlorhexidine Gluconate Cloth  6 each Topical Q0600  . collagenase   Topical BID  . darbepoetin (ARANESP) injection - NON-DIALYSIS  100 mcg Subcutaneous Q Sat-1800  . diphenhydrAMINE  25 mg Oral Once  . famotidine  10 mg Oral Daily  . feeding supplement (NEPRO CARB STEADY)  1,000 mL Per Tube Q24H  . feeding supplement (PRO-STAT SUGAR FREE 64)  30 mL Per Tube Daily  . fluconazole  200 mg Oral Daily  . gabapentin  100 mg Oral TID  . Gerhardt's butt cream   Topical TID  . heparin  5,000 Units Subcutaneous Q8H  . loratadine  10 mg Oral Daily  . mouth rinse  15 mL Mouth Rinse BID  . methylPREDNISolone (SOLU-MEDROL) injection  40 mg Intravenous Daily  . neomycin-bacitracin-polymyxin   Topical BID  . nystatin  5 mL Mouth/Throat QID  . prochlorperazine   5 mg Oral Once  . sertraline  25 mg Oral BID  . sodium chloride flush  3 mL Intravenous Q12H  . sodium chloride flush  3 mL Intravenous Q12H  . sodium zirconium cyclosilicate  5 g Oral Once      [facility]  08/02/2019, 9:14 AM

## 2019-08-02 NOTE — Progress Notes (Signed)
PT Cancellation Note  Patient Details Name: Joanna Ali MRN: [id number] DOB: 10-04-1950   Cancelled Treatment:    Reason Eval/Treat Not Completed: Other (comment) RN requested PT/OT held for today. PT will continue to follow acutely.    Earney Navy, PTA Acute Rehabilitation Services Pager: [phone] Office: [phone]   08/02/2019, 2:08 PM

## 2019-08-02 NOTE — Progress Notes (Addendum)
Pulmonary Progress Note  Seen for dyspnea.  S: No events, still c/o back pain from thoracentesis.  RR improved. She is pretty out of it.  O:  Today's Vitals   08/01/19 2009 08/01/19 2332 08/02/19 0344 08/02/19 0607  BP:  (!) 150/100 (!) 159/109 (!) 154/100  Pulse:  100 (!) 105 97  Resp:  (!) 27 (!) 29   Temp:  (!) 97.5 F (36.4 C) 97.6 F (36.4 C)   TempSrc:  Axillary Axillary   SpO2:  100% 99%   Weight:      Height:      PainSc: 0-No pain      Body mass index is 18.5 kg/m. Ill cachetic woman in NAD Less tachypneic, better air movement today No peripheral edema PEG in place + muscle wasting Globally weak, confused  A: # Respiratory distress- multifactorial from ongoing third spacing including into pleura, inability to clear secretions, and volume overloaded state of heart; improved after thoracentesis # ESRD, severe protein calorie malnutrition, profound deconditioning all related to prolonged alcohol abuse # Opiate and benzodiazepine dependence, hospital-acquired # Frail elderly approaching end of life  P: - Encourage IS, ambulation, nutrition as able - I am not sure how much more Ms. Astorino can take, her degree of deconditioning and malnutrition is profound and likely terminal - Available on a PRN basis  Erskine Emery MD PCCM  Called regarding lethargy- this is related to benzodiazepines, consider using atypical antipsychotics to control her delirium instead to reduce lethargic side effects.  ABG has been ordered, fine to use BIPAP if retaining.  I agree with Dr. Moshe Cipro regarding avoiding further steroids.

## 2019-08-02 NOTE — Progress Notes (Signed)
Inpatient Rehab Admissions Coordinator:   Per chart review, may not be able to tolerate OP HD, and may need to revisit [place] with Palliative.  Will follow from a distance.   Shann Medal, PT, DPT Admissions Coordinator [phone] 08/02/19  10:22 AM

## 2019-08-03 LAB — RENAL FUNCTION PANEL
Albumin: 2.5 g/dL — ABNORMAL LOW (ref 3.5–5.0)
Anion gap: 14 (ref 5–15)
BUN: 98 mg/dL — ABNORMAL HIGH (ref 8–23)
CO2: 27 mmol/L (ref 22–32)
Calcium: 8.7 mg/dL — ABNORMAL LOW (ref 8.9–10.3)
Chloride: 92 mmol/L — ABNORMAL LOW (ref 98–111)
Creatinine, Ser: 4.02 mg/dL — ABNORMAL HIGH (ref 0.44–1.00)
GFR calc Af Amer: 12 mL/min — ABNORMAL LOW (ref >60)
GFR calc non Af Amer: 11 mL/min — ABNORMAL LOW (ref >60)
Glucose, Bld: 148 mg/dL — ABNORMAL HIGH (ref 70–99)
Phosphorus: 5.3 mg/dL — ABNORMAL HIGH (ref 2.5–4.6)
Potassium: 5.3 mmol/L — ABNORMAL HIGH (ref 3.5–5.1)
Sodium: 133 mmol/L — ABNORMAL LOW (ref 135–145)

## 2019-08-03 LAB — GLUCOSE, CAPILLARY
Glucose-Capillary: 114 mg/dL — ABNORMAL HIGH (ref 70–99)
Glucose-Capillary: 116 mg/dL — ABNORMAL HIGH (ref 70–99)
Glucose-Capillary: 119 mg/dL — ABNORMAL HIGH (ref 70–99)
Glucose-Capillary: 119 mg/dL — ABNORMAL HIGH (ref 70–99)
Glucose-Capillary: 121 mg/dL — ABNORMAL HIGH (ref 70–99)
Glucose-Capillary: 138 mg/dL — ABNORMAL HIGH (ref 70–99)

## 2019-08-03 LAB — ANTI-DNA ANTIBODY, DOUBLE-STRANDED: ds DNA Ab: <1 IU/mL (ref 0–9)

## 2019-08-03 LAB — T3, FREE: T3, Free: 2.3 pg/mL (ref 2.0–4.4)

## 2019-08-03 MED ORDER — SODIUM ZIRCONIUM CYCLOSILICATE 10 G PO PACK
10.0000 g | PACK | Freq: Once | ORAL | Status: AC
  Administered 2019-08-03: 10 g via ORAL
  Filled 2019-08-03: qty 1

## 2019-08-03 MED ORDER — METHYLPREDNISOLONE SODIUM SUCC 125 MG IJ SOLR
125.0000 mg | Freq: Once | INTRAMUSCULAR | Status: AC
  Administered 2019-08-03: 22:00:00 125 mg via INTRAVENOUS
  Filled 2019-08-03: qty 2

## 2019-08-03 MED ORDER — GABAPENTIN 100 MG PO CAPS
100.0000 mg | ORAL_CAPSULE | Freq: Two times a day (BID) | ORAL | Status: DC
  Administered 2019-08-04 – 2019-08-11 (×16): 100 mg via ORAL
  Filled 2019-08-03 (×16): qty 1

## 2019-08-03 MED ORDER — AMLODIPINE BESYLATE 10 MG PO TABS
10.0000 mg | ORAL_TABLET | Freq: Every day | ORAL | Status: DC
  Administered 2019-08-03 – 2019-08-07 (×5): 10 mg via ORAL
  Filled 2019-08-03 (×5): qty 1

## 2019-08-03 MED ORDER — FENTANYL CITRATE (PF) 100 MCG/2ML IJ SOLN
12.5000 ug | Freq: Once | INTRAMUSCULAR | Status: AC
  Administered 2019-08-03: 12.5 ug via INTRAVENOUS
  Filled 2019-08-03: qty 2

## 2019-08-03 MED ORDER — AMLODIPINE BESYLATE 5 MG PO TABS: 5.0000 mg | ORAL_TABLET | Freq: Every day | ORAL | Status: DC

## 2019-08-03 MED ORDER — FENTANYL CITRATE (PF) 100 MCG/2ML IJ SOLN
12.5000 ug | INTRAMUSCULAR | Status: DC | PRN
  Administered 2019-08-03 – 2019-08-05 (×6): 25 ug via INTRAVENOUS
  Administered 2019-08-07: 12.5 ug via INTRAVENOUS
  Administered 2019-08-08 – 2019-08-09 (×3): 25 ug via INTRAVENOUS
  Filled 2019-08-03 (×9): qty 2

## 2019-08-03 MED ORDER — CHLORHEXIDINE GLUCONATE CLOTH 2 % EX PADS
6.0000 | MEDICATED_PAD | Freq: Every day | CUTANEOUS | Status: DC
  Administered 2019-08-03 – 2019-08-05 (×3): 6 via TOPICAL

## 2019-08-03 NOTE — Progress Notes (Signed)
Physical Therapy Treatment Patient Details Name: Joanna Ali MRN: [id number] DOB: 07-17-1951 Today's Date: 08/03/2019    History of Present Illness Pt is a 69 y.o. female admitted 07/03/19 with anasarca, fatigue and tachycardia. 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 Pt with pericardial effusion s/p pericardial window. PEG tube placed. PMH includes former tobacco and alcohol abuse, HTN, chronic pleural effusion.   PT Comments    Pt slowly progressing with mobility; increased pain and lethargy this session. Husband has been assisting pt to sit EOB throughout day since pt uncomfortable in recliner. Today, pt able to perform seated and standing mobility, requiring maxA to stand, limited by pain, generalized weakness, impaired cognition and fearful of falling. Husband present and supportive. Discussed pt's current level of activity tolerance with pt and husband; husband still hopeful for CIR admission. Continue to recommend intensive CIR-level therapies to maximize functional mobility and independence prior to return home.    Follow Up Recommendations  CIR;Supervision/Assistance - 24 hour     Equipment Recommendations  Rolling walker with 5" wheels    Recommendations for Other Services  Palliative medicine     Precautions / Restrictions Precautions Precautions: Fall;Other (comment) Precaution Comments: PEG tube Restrictions Weight Bearing Restrictions: No    Mobility  Bed Mobility Overal bed mobility: Needs Assistance Bed Mobility: Supine to Sit     Supine to sit: Mod assist;HOB elevated Sit to supine: Min assist   General bed mobility comments: Husband able to demonstrate technique to assist pt to EOB with UE support and modA to scoot hips; minA for LE management return to supine; maxA to scoot up in bed at end of session  Transfers Overall transfer level: Needs assistance Equipment used: Rolling walker (2 wheeled) Transfers: Sit to/from Stand Sit  to Stand: Max assist         General transfer comment: Ultimately performed 2x sit<>stand to RW, pt requiring max, multimodal cues for sequencing, maxA to elevate trunk. Prolonged seated rest between trials, difficulty achieving full trunk extension despite max cues. Pt with c/o pain requiring max encouragement to maintain standing; likely limited by fearful of falling/decreased confidence  Ambulation/Gait             General Gait Details: Unable to progress this session   Stairs             Wheelchair Mobility    Modified Rankin (Stroke Patients Only)       Balance                                            Cognition Arousal/Alertness: Awake/alert;Lethargic Behavior During Therapy: Flat affect;Anxious Overall Cognitive Status: Impaired/Different from baseline Area of Impairment: Attention;Following commands;Safety/judgement;Awareness;Problem solving                   Current Attention Level: Focused;Sustained   Following Commands: Follows one step commands with increased time;Follows one step commands inconsistently Safety/Judgement: Decreased awareness of safety;Decreased awareness of deficits Awareness: Intellectual;Emergent Problem Solving: Difficulty sequencing;Decreased initiation;Requires verbal cues;Requires tactile cues;Slow processing        Exercises Other Exercises Other Exercises: Pt initially with painful passive R shoulder ABD/flex, able to progress to bilateral shoulder AROM although some limitations still noted; husband present and agreeable to aid pt with performing this and LE AROM/AAROM Other Exercises: Pt left with pillows/towels positioned to float heels and encourage hip  IR/ADD    General Comments        Pertinent Vitals/Pain Pain Assessment: Faces Faces Pain Scale: Hurts even more Pain Location: abdomen (peg insertion site) Pain Descriptors / Indicators: Grimacing;Moaning Pain Intervention(s): Monitored  during session;Limited activity within patient's tolerance    Home Living                      Prior Function            PT Goals (current goals can now be found in the care plan section) Acute Rehab PT Goals Patient Stated Goal: to get stronger and go home; get back to playing golf PT Goal Formulation: With patient/family Time For Goal Achievement: 08/17/19 Potential to Achieve Goals: Fair Progress towards PT goals: Progressing toward goals(slowly)    Frequency    Min 3X/week      PT Plan Current plan remains appropriate    Co-evaluation              AM-PAC PT "6 Clicks" Mobility   Outcome Measure  Help needed turning from your back to your side while in a flat bed without using bedrails?: A Little Help needed moving from lying on your back to sitting on the side of a flat bed without using bedrails?: A Lot Help needed moving to and from a bed to a chair (including a wheelchair)?: A Lot Help needed standing up from a chair using your arms (e.g., wheelchair or bedside chair)?: A Lot Help needed to walk in hospital room?: A Lot Help needed climbing 3-5 steps with a railing? : Total 6 Click Score: 12    End of Session Equipment Utilized During Treatment: Gait belt Activity Tolerance: Patient limited by pain;Patient limited by fatigue Patient left: in bed;with call bell/phone within reach;with bed alarm set;with family/visitor present Nurse Communication: Mobility status PT Visit Diagnosis: Unsteadiness on feet (R26.81);Muscle weakness (generalized) (M62.81);Difficulty in walking, not elsewhere classified (R26.2);Pain     Time: 1636-1710 PT Time Calculation (min) (ACUTE ONLY): 34 min  Charges:  $Therapeutic Exercise: 8-22 mins $Therapeutic Activity: 8-22 mins                    Mabeline Caras, PT, DPT Acute Rehabilitation Services  Pager [phone] Office [place] 08/03/2019, 5:38 PM

## 2019-08-03 NOTE — Progress Notes (Signed)
Inpatient Rehab Admissions Coordinator:   Met with pt and husband at bedside as she was cleaned up by NT.  Pt moaning in discomfort and crying out when I left the room.  I do not have a bed available for this patient today, but it may be beneficial to revisit goals of care with pt/husband and palliative, as I'm not sure she could tolerate CIR well at the moment. Discussed with Dr. Tyrell Antonio.   Shann Medal, PT, DPT Admissions Coordinator [phone] 08/03/19  12:44 PM

## 2019-08-03 NOTE — Progress Notes (Signed)
Nutrition Follow-up  RD working remotely.  DOCUMENTATION CODES:   Severe malnutrition in context of chronic illness, Underweight  INTERVENTION:   Continue tube feeding via G-tube: - Nepro @ 40 ml/hr (960 ml/day) - Pro-stat 30 ml daily - Free water per MD  Tube feeding regimen provides 1828 kcal, 93 grams of protein, and 698 ml of H2O, meets 100% of needs.  - Encourage adequate PO intake and provide feeding assistance as needed  NUTRITION DIAGNOSIS:   Severe Malnutrition related to chronic illness (pericardial effusion w/ volume overload) as evidenced by moderate fat depletion, severe muscle depletion, percent weight loss.  Ongoing, being addressed via TF  GOAL:   Patient will meet greater than or equal to 90% of their needs  Met via TF  MONITOR:   PO intake, Supplement acceptance, Labs, TF tolerance, Weight trends, I & O's  REASON FOR ASSESSMENT:   Consult Assessment of nutrition requirement/status  ASSESSMENT:   Patient with PMH significant for chronic pericardial effusion, moderate AVR, HTN, hepatic steatosis, and peripheral neuropathy. Presents this admission with anasarca and bilateral pleural effusions.  12/15 - s/p bilateral thoracentesiswith700 ml fluid removed 12/17 - s/p cardiac cath 12/23 - s/p VATS, pericardial window 12/30 - Cortrak placed, tip in stomach 01/07 - TF switched to Nepro due to persistent hyperkalemia, Cortrak removed 01/08 - s/p G-tube placement 01/09 - steroids initiated 01/12 - s/p bilateral thoracentesis with 1600 ml total fluid removed  No meal completions recorded since 1/10.  Last HD on 1/13 with 2106 ml net UF. Weight this AM was 49.7 kg. Pt with an episode of unresponsiveness after HD, now resolved.  Current weight is 5 lbs below admit weight. Per RN edema assessment on 1/13, pt with mild pitting generalized edema and mild pitting edema to BLE.  Per Nephrology, pt may not be a candidate for OP HD. Nephrology recommending  additional [place] discussions.  Current TF: Nepro @ 40 ml/hr, Pro-stat 30 ml daily  Medications reviewed and include: Pepcid, fluconazole, solu-medrol, nystatin  Labs reviewed: sodium 133, potassium 5.3, chloride 92, phosphorus 5.3, hemoglobin 9.9 CBG's: 98-138 x 24 hours  Diet Order:   Diet Order            Diet regular Room service appropriate? Yes; Fluid consistency: Thin; Fluid restriction: 1500 mL Fluid  Diet effective now              EDUCATION NEEDS:   Education needs have been addressed  Skin:  Skin Assessment: Skin Integrity Issues: Stage II: sacrum, bilateral nares Incisions: chest, left neck Other: cracking/dryness to BLE  Last BM:  08/01/19  Height:   Ht Readings from Last 1 Encounters:  07/04/19 5' 6"  (1.676 m)    Weight:   Wt Readings from Last 1 Encounters:  08/03/19 49.7 kg    Ideal Body Weight:  59.1 kg  BMI:  Body mass index is 17.68 kg/m.  Estimated Nutritional Needs:   Kcal:  1600-1800 kcal  Protein:  80-95 grams  Fluid:  >/= 1.6 L/day    Gaynell Face, MS, RD, LDN Inpatient Clinical Dietitian Pager: [phone] Weekend/After Hours: [phone]

## 2019-08-03 NOTE — Care Management Important Message (Signed)
Important Message  Patient Details  Name: Joanna Ali MRN: [id number] Date of Birth: January 28, 1951   Medicare Important Message Given:  Yes     Memory Argue 08/03/2019, 11:30 AM

## 2019-08-03 NOTE — Plan of Care (Signed)

## 2019-08-03 NOTE — Progress Notes (Addendum)
I have seen and evaluated this patient and reviewed the medical records in detail and also spoken at length with the nursing staff who have been at the bedside caring for her for several weeks now. On 1/8 palliative care was consulted to discuss her goals of care- at that time she was in the process of getting a PEG tube and had lost a significant amount of weight related to unspecified throat pain and poor appetite. She has been started on hemodialysis for acute kidney injury and contrast induced nephropathy. She has had persistently low albumin despite over two weeks of supplemental nutrition with tube feeding. Her non-specific inflammatory markers have remained elevated. At that point, the palliative care team with my supervision and guidance initiated a trial of steroids primarily aimed at symptom relief, to stimulate her appetite and to reduce inflammatory pain that was evident on exam.   The steroids were not started to treat or to presumably treat GN which she does not have. They were for inflammatory pain, to stimulate appetite, improve her energy level and also as a reasonable empiric approach to the very high suspicion and likely hood that she has an atypical rheumatologic or inflammatory condition.  The morning following administration of IV Solu-medrol 1/10 she was dramatically improved, she was up out of the bed with minimal assist and actually ate breakfast and was improved enough to go to CIR. The following day 1/11 she awoke with severe anxiety and breathlessness- it was positional- improved with sitting up and leaning forward. She was given prn anxiety medications.The breathlessness continued and on 1/12 cxr suggested significant pleural effusions and a thoracentesis was repeated. On 1/13 in HD she has panic and mental status changes in HD -rapid response was called due to over sedation with Ativan. Opioids, benzos were stopped, her steroids were significantly reduced.  This even Ms. Montas is  extremely lethargic, she is in visible pain and discomfort, moaning. She will wake up and speak to me and is appropropriate but falls back asleep very quickly.She tells me that her entire body hurts, especially her joints and she can barely tolerate me moving them. She says she is scared when she cant breathe. She is complaining of severe back pain.  In my opinion the very large pleural effusions and restricted breathing caused her the sensation of air hunger and panic that she experienced-steroids were probably not entirely to blame for anxiety but this is a known side effect which must be weighed with the initial impact that they had on her-improved energy and appetite and pain control.        Current Goals of Care: To continue to exhaust all possible medical options to diagnose and treat her condition.  Recommendations:  1. After a brief period of improvement, she looks critically ill this evening- her joints are inflammed- very red and nodules seen. She has bilateral Raynaud's in her fingers, she cannot turn is bed without screaming in pain. She needs steroids continued. She also needs a diagnosis, although I am very concerned about the degree of her physical deterioration. If intolerant to steroids she needs to be considered for other anti-inflammatory or immunologic options.   2. I recommend she be transferred to a tertiary center where she can receive acute inpatient rheumatology services.  3. I have resumed Fentanyl IV for pain PRN.  4. Will give an additional dose of solumedrol this evening to see if it helps her pain and extreme lethargy.  I will contact her husband in  AM to discuss my recommendations- he has left for the evening.  Lane Hacker, DO Palliative Medicine [phone]

## 2019-08-03 NOTE — Progress Notes (Signed)
[facility] Progress Note    Assessment/ Plan:   Pt is a 69 y.o. yo female  with history of HTN,  hepatic steatosis with low albumin, chronic pericardial effusion, moderate AR admitted on 12/17 for progressive dyspnea, anasarca , seen as a consultationfor AKIand fluid overload.  #Acute kidney injury,nonoliguric (crt normal in Sept, was 1.43 on 07/03/19) : Likely hemodynamically mediated, possible HRS concomitant with use of IV contrast- U/A not remarkable.  Autoimmune work-up unremarkable.   Started HD 07/18/19 -  IR placed TDC.  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  Starting CLIP process.  Based on how she looked yesterday I really dont think she is a great candidate for OP HD.  Tentatively has a spot in [facility] [place].  Next HD is planned for 1/15-- will likely need longer treatment given steroids and volume   From a renal perspective I think empiric trial of steroids is fine  but there has been no evidence for GN and I don't anticipate any direct benefit or toxicity to the kidneys.  I now am worried that the steroids is making her anxiety worse  #Hyperkalemia:  Has been having intermittent hyperK.  Try to avoid lokelma as it irritated throat. - now on nepro.  Needed lokelma this week unfortunately - steroids are not going to help this issue   #Anasarca presumably due to liver disease which was diagnosed at [facility]. She has a history of liver steatosis.No evidence of GN/nephrotic syndrome. 3rd spacing also. volume status much improved but is still an issue   #Moderate to large pericardial effusion:  s/p pericardial window on 12/23- TCTS following, stable  #Hypertension: Blood pressure acceptable. if anything is high on low dose Coreg. We need to try and UF more as able . #Anemia: s/p transfusion on 12/22.  gave feraheme and on ESA weekly.  Hb stable in the 9's    #Hyperphosphatemia:  Improved with dialysis.  Not her  biggest issue right now-  Given her throat issues have not started binder  **Malnutrition:  S/p G tube 1/8 -  Albumin low  Unresponsiveness-  Apologize for overmedicating-  She was screaming in HD unit-  Should have started with lower dose-  More judicious use of sedating meds    Dispo:  She can move to CIR and cont HD.  Palliative care is involved in [place] discussions. This has been a very long road for her.  Rehab will take once has OP dialysis seat.  She does not look today appropriate for rehab-  May need to revisit GOC     Subjective:    Events noted-  Was unresponsive after HD and second dose of ativan- better now.  Today she is lying still -  Can hardly move-  Gets distracted easily - does not really know why dialysis was so rough yesterday     Objective:   BP (!) 150/84   Pulse 92   Temp 97.8 F (36.6 C) (Axillary)   Resp (!) 25   Ht 5' 6"  (1.676 m)   Wt 49.7 kg   SpO2 100%   BMI 17.68 kg/m   Intake/Output Summary (Last 24 hours) at 08/03/2019 6720 Last data filed at 08/03/2019 0410 Gross per 24 hour  Intake 6 ml  Output 2106 ml  Net -2100 ml   Weight change:   Physical Exam: Gen: cachectic, inc RR-  Moves eyes but that is about it CVS: RRR, well-healing scars Resp: normal  WOB Abd: thin, mildly painful to palpation Ext: + flank edema ACCESS: L TDC c/d/i  Imaging: CT HEAD WO CONTRAST  Result Date: 08/02/2019 CLINICAL DATA:  Encephalopathy EXAM: CT HEAD WITHOUT CONTRAST TECHNIQUE: Contiguous axial images were obtained from the base of the skull through the vertex without intravenous contrast. COMPARISON:  None. FINDINGS: Brain: No evidence of acute infarction, hemorrhage, hydrocephalus, extra-axial collection or mass lesion/mass effect. Mild periventricular white matter hypodensity. Vascular: No hyperdense vessel or unexpected calcification. Skull: Normal. Negative for fracture or focal lesion. Sinuses/Orbits: No acute finding. Other: None. IMPRESSION: No acute  intracranial pathology.  Small-vessel white matter disease. Electronically Signed   By: Eddie Candle M.D.   On: 08/02/2019 16:13   DG CHEST PORT 1 VIEW  Result Date: 08/01/2019 CLINICAL DATA:  Post thoracentesis bilateral EXAM: PORTABLE CHEST 1 VIEW COMPARISON:  08/01/2019 FINDINGS: Marked improvement in bilateral pleural effusion post thoracentesis. No pneumothorax Improvement in diffuse bilateral airspace disease compatible with edema. Mild residual edema. Bilateral central venous catheters unchanged. IMPRESSION: Marked improvement in aeration with decreased edema and decreased pleural effusions. No pneumothorax. Electronically Signed   By: Franchot Gallo M.D.   On: 08/01/2019 13:52   DG CHEST PORT 1 VIEW  Result Date: 08/01/2019 CLINICAL DATA:  Episodes of shortness of breath and congestion for 2 days EXAM: PORTABLE CHEST 1 VIEW COMPARISON:  4 days ago FINDINGS: Bilateral airspace disease asymmetric to the left where there is pleural effusion seen along the lateral chest wall. Chronic cardiopericardial enlargement. Bilateral central line with tips at the upper cavoatrial junction. IMPRESSION: Extensive airspace disease and sizable left pleural effusion that is more readily seen than on prior. Findings could reflect infection or CHF. Electronically Signed   By: Monte Fantasia M.D.   On: 08/01/2019 11:46    Labs: BMET Recent Labs  Lab 07/28/19 0418 07/28/19 0418 07/29/19 0348 07/30/19 0500 07/31/19 0509 08/01/19 0500 08/01/19 1455 08/02/19 0413 08/03/19 0440  NA 134*   < > 133* 130* 130* 132* 131* 132* 133*  K 5.3*   < > 4.8 5.8* 6.2* 6.0* 5.4* 6.1* 5.3*  CL 92*   < > 92* 92* 91* 93* 89* 92* 92*  CO2 30   < > 28 27 24 24 23 25 27   GLUCOSE 97   < > 88 140* 153* 141* 137* 124* 148*  BUN 46*   < > 40* 67* 103* 87* 103* 121* 98*  CREATININE 2.83*   < > 3.11* 4.15* 4.83* 3.94* 4.20* 4.63* 4.02*  CALCIUM 8.4*   < > 8.4* 8.7* 9.0 8.5* 8.7* 8.9 8.7*  PHOS 4.3  --  4.8* 7.4* 7.5* 5.8*  --   7.0* 5.3*   < > = values in this interval not displayed.   CBC Recent Labs  Lab 07/30/19 0500 08/02/19 0700  WBC 4.5 9.3  HGB 9.3* 9.9*  HCT 31.2* 32.1*  MCV 95.4 96.7  PLT 230 285    Medications:    . carvedilol  3.125 mg Oral BID WC  . Chlorhexidine Gluconate Cloth  6 each Topical Q0600  . collagenase   Topical BID  . darbepoetin (ARANESP) injection - NON-DIALYSIS  100 mcg Subcutaneous Q Sat-1800  . diphenhydrAMINE  25 mg Oral Once  . famotidine  10 mg Oral Daily  . feeding supplement (NEPRO CARB STEADY)  1,000 mL Per Tube Q24H  . feeding supplement (PRO-STAT SUGAR FREE 64)  30 mL Per Tube Daily  . fluconazole  200 mg Oral Daily  .  gabapentin  100 mg Oral TID  . Gerhardt's butt cream   Topical TID  . heparin  5,000 Units Subcutaneous Q8H  . loratadine  10 mg Oral Daily  . mouth rinse  15 mL Mouth Rinse BID  . methylPREDNISolone (SOLU-MEDROL) injection  20 mg Intravenous Daily  . neomycin-bacitracin-polymyxin   Topical BID  . nystatin  5 mL Mouth/Throat QID  . prochlorperazine  5 mg Oral Once  . sertraline  25 mg Oral BID  . sodium chloride flush  3 mL Intravenous Q12H  . sodium chloride flush  3 mL Intravenous Q12H  . sodium zirconium cyclosilicate  5 g Oral Once      [facility]  08/03/2019, 7:09 AM

## 2019-08-03 NOTE — Progress Notes (Signed)
PROGRESS NOTE    Joanna Ali  EOF:[id number] DOB: 02-Dec-1950 DOA: 07/03/2019 PCP: Renaldo Reel, DO   Brief Narrative: 69 year old with past medical history significant for hypertension, chronic pericardial effusion, moderate aortic valve regurgitation, former smoker, alcohol use.  Initially admitted on 12/17 for progressive shortness of breath with anasarca and fatigue.  Previously worked up by [facility] rheumatology with negative work-up including ESR, lupus anticoagulant, rheumatoid factor, TSH, ANCA, ANA and light free chain studies.  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  Since that time patient had stopped drinking alcohol as advised.  She had been on diuretics and spironolactone without any improvement in symptoms.  CT angiogram of the chest on 12/15 noted moderate pericardial effusion and airspace disease.  She underwent thoracentesis by PCCM which removed 700 cc of fluid.  Patient had undergone right heart cath on 12/17 and underwent pericardial window on 12/23.  Attempts were made with IV diuretics but patient developed worsening kidney function with creatinine up to 5.  Nephrology was formally consulted and patient was a started on dialysis.  Patient has been having poor oral intake and she had a core track place. Since last medicine consult note patient has a PEG for persistent poor oral intake and throat pain, on hemodialysis for AKI with rapid progression to end-stage renal disease, severe anasarca and pericardial effusion status post window.  She has been recently started on empiric steroids.   Assessment & Plan:   Principal Problem:   Anasarca Active Problems:   Pericardial effusion   Hypertension   Elevated troponin   Atypical pneumonia   Hypoxia   Protein-calorie malnutrition, severe   Pressure injury of skin   Failure to  thrive in adult   Goals of care, counseling/discussion   Dysphagia   Muscular weakness   Thrush of mouth and esophagus (HCC)   ESRD on hemodialysis (HCC)   Dyspnea   S/P thoracentesis  1-Anasarca;  -Thought to be related to Chronic ETOH use and Hepatic Steatosis.  -Continue with HD, and nutrition support.  -Started on IV steroid to treat empirically in case Rheumatology etiology. She has had previous unrevealing Rheumatology evaluation.  -I have order labs recommended by Dr Hilma Favors, as miscellaneous; Urine and blood Histamine level.  -Reduce solumedrol to 20 mg IV daily to help reduce anxiety.  - Double strand DNA ordered.  -TSH mildly elevated, Free T 3 and T 4 normal.   2-ESRD new on HD;  Progressive AKI, found to be exacerbated by contrast-induced nephropathy. Started on hemodialysis, with improvement of anasarca. Out patient HD has been set up.   3-Acute AMS, Lethargic, Encephalopathy;  Patient became lethargic in HD, 1-13 Likely related to medication ativan. She got a dose during HD>  Will limit sedatives. Stop fentanyl, decrease opioids. Stop ativan.  ABG PCO2 not elevated. CT head Negative for acute abnormalities.  Alert, following command today.   4-Acute Hypoxic Respiratory Failure; Pulmonary edema, pleural effusion; Complaints of worsening dyspnea.  Chest x ray worsening left pleural effusion/. Pulmonary edema, infiltrates. CCM consulted.  She was started on IV solumedrol by palliative for presume autoimmune process ?? Patient has been more anxious since them. I will decreased dose solumedrol.  Worsening Dyspnea, chest x ray 1-12; worsening pleural effusion. Underwent repeated Thoracentesis 1-12; 1.1 L Left  and 500 cc from right.  Oxygen sat 99 3 L.   3-Pericardial  effusion with elevated troponin;  -Status post pericardial window on 12/23, under care of cardiology and CT surgery.   -She had a pericardial effusion as well as pleural effusion that are thought to be  stable at this point in time.   4-Hyperkalemia;  Repeat Lokelma.   5-Failure to Thrive, Severe malnutrition;  -Continue with tube feeding.  6-Atypical PNA with Hypoxia; Completed treatment.   7-Dysphagia Thrush;  On Diflucan.  On tube feeding.  Pepcid.   8-Stage 2 pressure Ulcer;  Continue with local care.   9-Anxiety;  Continue with Zoloft.  Avoid sedatives, became very lethargic during HD 1-13 after 1 mg Ativan,.  10-Back pain;  Continue with fentanyl, oxycodone.   11-HTN;  Continue with coreg. Will add norvasc.   Hypernatremia; corrected with HD.    Pressure Injury 07/19/19 Sacrum Posterior;Mid Stage 2 -  Partial thickness loss of dermis presenting as a shallow open injury with a red, pink wound bed without slough. (Active)  07/19/19 0600  Location: Sacrum  Location Orientation: Posterior;Mid  Staging: Stage 2 -  Partial thickness loss of dermis presenting as a shallow open injury with a red, pink wound bed without slough.  Wound Description (Comments):   Present on Admission: No     Pressure Injury 07/22/19 Nare Right;Left Stage 2 -  Partial thickness loss of dermis presenting as a shallow open injury with a red, pink wound bed without slough. Injury r/t cortrak (Active)  07/22/19 1000  Location: Nare  Location Orientation: Right;Left  Staging: Stage 2 -  Partial thickness loss of dermis presenting as a shallow open injury with a red, pink wound bed without slough.  Wound Description (Comments): Injury r/t cortrak  Present on Admission: No     Nutrition Problem: Severe Malnutrition Etiology: chronic illness(pericardial effusion w/ volume overload)    Signs/Symptoms: moderate fat depletion, severe muscle depletion, percent weight loss    Interventions: Ensure Enlive (each supplement provides 350kcal and 20 grams of protein), Tube feeding  Estimated body mass index is 17.68 kg/m as calculated from the following:   Height as of this encounter: 5' 6"  (1.676 m).   Weight as of this encounter: 49.7 kg.   DVT prophylaxis: Heparin Code Status: full code Family Communication: care discussed with patient Disposition Plan: Transfer to CIR  , no bed available today in CIR Consultants:   Cardiology  CCM  CVTS  Palliative  Nephrology  Procedures:   HD  Pericardial window  Antimicrobials:  None currently   Subjective: She is alert, report breathing ok.. she wants to change position in bed  Will DC foley catheter.  Will DC central line.   Objective: Vitals:   08/03/19 0406 08/03/19 0616 08/03/19 0758 08/03/19 1152  BP: (!) 154/89 (!) 150/84 (!) 194/94 (!) 158/86  Pulse: 91 92 94 93  Resp: (!) 25   (!) 31  Temp: 97.8 F (36.6 C)  (!) 97.3 F (36.3 C) 97.9 F (36.6 C)  TempSrc: Axillary  Axillary Axillary  SpO2: 100%  100% 100%  Weight: 49.7 kg     Height:        Intake/Output Summary (Last 24 hours) at 08/03/2019 1222 Last data filed at 08/03/2019 1017 Gross per 24 hour  Intake 72 ml  Output 40 ml  Net 32 ml   Filed Weights   07/31/19 1140 08/02/19 0740 08/03/19 0406  Weight: 52 kg 53.1 kg 49.7 kg    Examination:  General exam: Alert, follows command Respiratory system: Crackles bases Cardiovascular  system: S 1, S 2 RRR Gastrointestinal system: BS present,soft, nt Central nervous system: Alert, follows command Extremities: Symmetric power.    Data Reviewed: I have personally reviewed following labs and imaging studies  CBC: Recent Labs  Lab 07/30/19 0500 08/02/19 0700  WBC 4.5 9.3  HGB 9.3* 9.9*  HCT 31.2* 32.1*  MCV 95.4 96.7  PLT 230 630   Basic Metabolic Panel: Recent Labs  Lab 07/30/19 0500 07/30/19 0500 07/31/19 0509 08/01/19 0500 08/01/19 1455 08/02/19 0413 08/03/19 0440  NA 130*   < > 130* 132* 131* 132* 133*  K 5.8*   < > 6.2* 6.0* 5.4* 6.1* 5.3*  CL 92*   < > 91* 93* 89* 92* 92*  CO2 27   < > _0 GLUCOSE 140*   < > 153* 141* 137* 124* 148*  BUN 67*   < >  103* 87* 103* 121* 98*  CREATININE 4.15*   < > 4.83* 3.94* 4.20* 4.63* 4.02*  CALCIUM 8.7*   < > 9.0 8.5* 8.7* 8.9 8.7*  PHOS 7.4*  --  7.5* 5.8*  --  7.0* 5.3*   < > = values in this interval not displayed.   GFR: Estimated Creatinine Clearance: 10.2 mL/min (A) (by C-G formula based on SCr of 4.02 mg/dL (H)). Liver Function Tests: Recent Labs  Lab 07/30/19 0500 07/31/19 0509 08/01/19 0500 08/02/19 0413 08/03/19 0440  ALBUMIN 2.3* 2.5* 2.5* 2.5* 2.5*   No results for input(s): LIPASE, AMYLASE in the last 168 hours. No results for input(s): AMMONIA in the last 168 hours. Coagulation Profile: Recent Labs  Lab 07/27/19 1400  INR 1.1   Cardiac Enzymes: No results for input(s): CKTOTAL, CKMB, CKMBINDEX, TROPONINI in the last 168 hours. BNP (last 3 results) No results for input(s): PROBNP in the last 8760 hours. HbA1C: No results for input(s): HGBA1C in the last 72 hours. CBG: Recent Labs  Lab 08/02/19 2008 08/02/19 2332 08/03/19 0404 08/03/19 0753 08/03/19 1150  GLUCAP 98 133* 138* 114* 116*   Lipid Profile: No results for input(s): CHOL, HDL, LDLCALC, TRIG, CHOLHDL, LDLDIRECT in the last 72 hours. Thyroid Function Tests: Recent Labs    08/02/19 1433 08/02/19 1434  TSH 5.768*  --   FREET4 0.89  --   T3FREE  --  2.3   Anemia Panel: No results for input(s): VITAMINB12, FOLATE, FERRITIN, TIBC, IRON, RETICCTPCT in the last 72 hours. Sepsis Labs: No results for input(s): PROCALCITON, LATICACIDVEN in the last 168 hours.  Recent Results (from the past 240 hour(s))  Culture, group A strep     Status: None   Collection Time: 07/26/19 12:21 PM   Specimen: Throat  Result Value Ref Range Status   Specimen Description THROAT  Final   Special Requests NONE  Final   Culture   Final    NO GROUP A STREP (S.PYOGENES) ISOLATED Performed at [facility], [address] [place], [place] [address]    Report Status 07/28/2019 FINAL  Final         Radiology  Studies: CT HEAD WO CONTRAST  Result Date: 08/02/2019 CLINICAL DATA:  Encephalopathy EXAM: CT HEAD WITHOUT CONTRAST TECHNIQUE: Contiguous axial images were obtained from the base of the skull through the vertex without intravenous contrast. COMPARISON:  None. FINDINGS: Brain: No evidence of acute infarction, hemorrhage, hydrocephalus, extra-axial collection or mass lesion/mass effect. Mild periventricular white matter hypodensity. Vascular: No hyperdense vessel or unexpected calcification. Skull: Normal. Negative for fracture or focal  lesion. Sinuses/Orbits: No acute finding. Other: None. IMPRESSION: No acute intracranial pathology.  Small-vessel white matter disease. Electronically Signed   By: Eddie Candle M.D.   On: 08/02/2019 16:13   DG CHEST PORT 1 VIEW  Result Date: 08/01/2019 CLINICAL DATA:  Post thoracentesis bilateral EXAM: PORTABLE CHEST 1 VIEW COMPARISON:  08/01/2019 FINDINGS: Marked improvement in bilateral pleural effusion post thoracentesis. No pneumothorax Improvement in diffuse bilateral airspace disease compatible with edema. Mild residual edema. Bilateral central venous catheters unchanged. IMPRESSION: Marked improvement in aeration with decreased edema and decreased pleural effusions. No pneumothorax. Electronically Signed   By: Franchot Gallo M.D.   On: 08/01/2019 13:52        Scheduled Meds: . amLODipine  10 mg Oral Daily  . carvedilol  3.125 mg Oral BID WC  . Chlorhexidine Gluconate Cloth  6 each Topical Q0600  . Chlorhexidine Gluconate Cloth  6 each Topical Q0600  . collagenase   Topical BID  . darbepoetin (ARANESP) injection - NON-DIALYSIS  100 mcg Subcutaneous Q Sat-1800  . diphenhydrAMINE  25 mg Oral Once  . famotidine  10 mg Oral Daily  . feeding supplement (NEPRO CARB STEADY)  1,000 mL Per Tube Q24H  . feeding supplement (PRO-STAT SUGAR FREE 64)  30 mL Per Tube Daily  . fluconazole  200 mg Oral Daily  . gabapentin  100 mg Oral TID  . Gerhardt's butt cream    Topical TID  . heparin  5,000 Units Subcutaneous Q8H  . loratadine  10 mg Oral Daily  . mouth rinse  15 mL Mouth Rinse BID  . methylPREDNISolone (SOLU-MEDROL) injection  20 mg Intravenous Daily  . neomycin-bacitracin-polymyxin   Topical BID  . nystatin  5 mL Mouth/Throat QID  . prochlorperazine  5 mg Oral Once  . sertraline  25 mg Oral BID  . sodium chloride flush  3 mL Intravenous Q12H  . sodium chloride flush  3 mL Intravenous Q12H   Continuous Infusions: . sodium chloride 500 mL (07/06/19 1043)  . sodium chloride    . sodium chloride    . sodium chloride    . dextrose Stopped (07/29/19 1707)     LOS: 30 days    Time spent: 35 minutes     A , MD [facility]   If 7PM-7AM, please contact night-coverage [email or web address] Password [facility] 08/03/2019, 12:22 PM

## 2019-08-03 NOTE — Progress Notes (Signed)
PT Cancellation Note  Patient Details Name: Joanna Ali MRN: [id number] DOB: 09-Jan-1951   Cancelled Treatment:    Reason Eval/Treat Not Completed: Medical issues which prohibited therapy RN states she just took out her central line and pt needs to lie flat for at least 30 mins. Will follow up as able.   Christel Mormon, SPT   Silvana Newness 08/03/2019, 12:57 PM

## 2019-08-04 ENCOUNTER — Inpatient Hospital Stay (HOSPITAL_COMMUNITY): Payer: Medicare Other

## 2019-08-04 LAB — RENAL FUNCTION PANEL
Albumin: 2.5 g/dL — ABNORMAL LOW (ref 3.5–5.0)
Anion gap: 16 — ABNORMAL HIGH (ref 5–15)
BUN: 129 mg/dL — ABNORMAL HIGH (ref 8–23)
CO2: 24 mmol/L (ref 22–32)
Calcium: 8.7 mg/dL — ABNORMAL LOW (ref 8.9–10.3)
Chloride: 91 mmol/L — ABNORMAL LOW (ref 98–111)
Creatinine, Ser: 4.74 mg/dL — ABNORMAL HIGH (ref 0.44–1.00)
GFR calc Af Amer: 10 mL/min — ABNORMAL LOW (ref >60)
GFR calc non Af Amer: 9 mL/min — ABNORMAL LOW (ref >60)
Glucose, Bld: 140 mg/dL — ABNORMAL HIGH (ref 70–99)
Phosphorus: 7.2 mg/dL — ABNORMAL HIGH (ref 2.5–4.6)
Potassium: 5.5 mmol/L — ABNORMAL HIGH (ref 3.5–5.1)
Sodium: 131 mmol/L — ABNORMAL LOW (ref 135–145)

## 2019-08-04 LAB — GLUCOSE, CAPILLARY
Glucose-Capillary: 107 mg/dL — ABNORMAL HIGH (ref 70–99)
Glucose-Capillary: 123 mg/dL — ABNORMAL HIGH (ref 70–99)

## 2019-08-04 LAB — CBC
HCT: 32.1 % — ABNORMAL LOW (ref 36.0–46.0)
Hemoglobin: 9.2 g/dL — ABNORMAL LOW (ref 12.0–15.0)
MCH: 28.4 pg (ref 26.0–34.0)
MCHC: 28.7 g/dL — ABNORMAL LOW (ref 30.0–36.0)
MCV: 99.1 fL (ref 80.0–100.0)
Platelets: 274 10*3/uL (ref 150–400)
RBC: 3.24 MIL/uL — ABNORMAL LOW (ref 3.87–5.11)
RDW: 20.8 % — ABNORMAL HIGH (ref 11.5–15.5)
WBC: 4.4 10*3/uL (ref 4.0–10.5)
nRBC: 1.1 % — ABNORMAL HIGH (ref 0.0–0.2)

## 2019-08-04 LAB — AMMONIA: Ammonia: 29 umol/L (ref 9–35)

## 2019-08-04 MED ORDER — HEPARIN SODIUM (PORCINE) 1000 UNIT/ML IJ SOLN
INTRAMUSCULAR | Status: AC
  Administered 2019-08-04: 1000 [IU] via INTRAVENOUS_CENTRAL
  Filled 2019-08-04: qty 1

## 2019-08-04 MED ORDER — METHYLPREDNISOLONE SODIUM SUCC 40 MG IJ SOLR: 20.0000 mg | Freq: Two times a day (BID) | INTRAMUSCULAR | Status: DC

## 2019-08-04 MED ORDER — ALBUMIN HUMAN 25 % IV SOLN: 25.0000 g | Freq: Once | INTRAVENOUS | Status: AC

## 2019-08-04 MED ORDER — THIAMINE HCL 100 MG/ML IJ SOLN
250.0000 mg | Freq: Three times a day (TID) | INTRAVENOUS | Status: AC
  Administered 2019-08-04 – 2019-08-07 (×9): 250 mg via INTRAVENOUS
  Filled 2019-08-04 (×9): qty 2.5

## 2019-08-04 MED ORDER — METHYLPREDNISOLONE SODIUM SUCC 40 MG IJ SOLR
40.0000 mg | Freq: Two times a day (BID) | INTRAMUSCULAR | Status: DC
  Administered 2019-08-04: 40 mg via INTRAVENOUS

## 2019-08-04 MED ORDER — PANTOPRAZOLE SODIUM 40 MG IV SOLR
40.0000 mg | Freq: Two times a day (BID) | INTRAVENOUS | Status: DC
  Administered 2019-08-04 – 2019-08-13 (×20): 40 mg via INTRAVENOUS
  Filled 2019-08-04 (×20): qty 40

## 2019-08-04 MED ORDER — HEPARIN SODIUM (PORCINE) 1000 UNIT/ML IJ SOLN
INTRAMUSCULAR | Status: AC
  Filled 2019-08-04: qty 4

## 2019-08-04 MED ORDER — FENTANYL CITRATE (PF) 100 MCG/2ML IJ SOLN
INTRAMUSCULAR | Status: AC
  Filled 2019-08-04: qty 2

## 2019-08-04 MED ORDER — METHYLPREDNISOLONE SODIUM SUCC 40 MG IJ SOLR
40.0000 mg | Freq: Every day | INTRAMUSCULAR | Status: DC
  Administered 2019-08-04: 15:00:00 40 mg via INTRAVENOUS
  Filled 2019-08-04: qty 1

## 2019-08-04 MED ORDER — ALBUMIN HUMAN 25 % IV SOLN
INTRAVENOUS | Status: AC
  Administered 2019-08-04: 25 g via INTRAVENOUS
  Filled 2019-08-04: qty 100

## 2019-08-04 MED ORDER — HEPARIN SODIUM (PORCINE) 1000 UNIT/ML DIALYSIS: 20.0000 [IU]/kg | INTRAMUSCULAR | Status: DC | PRN

## 2019-08-04 MED ORDER — HEPARIN SODIUM (PORCINE) 1000 UNIT/ML IJ SOLN
INTRAMUSCULAR | Status: AC
  Administered 2019-08-04: 3800 [IU] via INTRAVENOUS_CENTRAL
  Filled 2019-08-04: qty 4

## 2019-08-04 NOTE — Plan of Care (Signed)
  Problem: Education: Goal: Knowledge of General Education information will improve Description: Including pain rating scale, medication(s)/side effects and non-pharmacologic comfort measures Outcome: Progressing   Problem: Health Behavior/Discharge Planning: Goal: Ability to manage health-related needs will improve Outcome: Not Progressing   Problem: Clinical Measurements: Goal: Ability to maintain clinical measurements within normal limits will improve Outcome: Not Progressing Goal: Will remain free from infection Outcome: Progressing Goal: Diagnostic test results will improve Outcome: Not Progressing Goal: Respiratory complications will improve Outcome: Progressing Goal: Cardiovascular complication will be avoided Outcome: Progressing   Problem: Activity: Goal: Risk for activity intolerance will decrease Outcome: Not Progressing   Problem: Nutrition: Goal: Adequate nutrition will be maintained Outcome: Not Progressing   Problem: Coping: Goal: Level of anxiety will decrease Outcome: Not Progressing   Problem: Elimination: Goal: Will not experience complications related to bowel motility Outcome: Progressing Goal: Will not experience complications related to urinary retention Outcome: Not Progressing   Problem: Pain Managment: Goal: General experience of comfort will improve Outcome: Not Progressing   Problem: Safety: Goal: Ability to remain free from injury will improve Outcome: Progressing   Problem: Skin Integrity: Goal: Risk for impaired skin integrity will decrease Outcome: Not Progressing

## 2019-08-04 NOTE — Progress Notes (Signed)
Pt rested well throughout the night. Pt complained of waist and back pain. RN repositioned pt and only had to give PRN pain meds twice during the shift. Pt would wake up to voice and was oriented throughout the night. O2 sats between 90-100% with pt fluctuating between room air and 2L O2.

## 2019-08-04 NOTE — Progress Notes (Addendum)
PROGRESS NOTE    Joanna Ali  ZYY:[id number] DOB: [birth year] DOA: 07/03/2019 PCP: Renaldo Reel, DO   Brief Narrative: 69 year old with past medical history significant for hypertension, chronic pericardial effusion, moderate aortic valve regurgitation, former smoker, alcohol use.  Initially admitted on 12/17 for progressive shortness of breath with anasarca and fatigue.  Previously worked up by [facility] rheumatology with negative work-up including ESR, lupus anticoagulant, rheumatoid factor, TSH, ANCA, ANA and light free chain studies.  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  Since that time patient had stopped drinking alcohol as advised.  She had been on diuretics and spironolactone without any improvement in symptoms.  CT angiogram of the chest on 12/15 noted moderate pericardial effusion and airspace disease.  She underwent thoracentesis by PCCM which removed 700 cc of fluid.  Patient had undergone right heart cath on 12/17 and underwent pericardial window on 12/23.  Attempts were made with IV diuretics but patient developed worsening kidney function with creatinine up to 5.  Nephrology was formally consulted and patient was a started on dialysis.  Patient has been having poor oral intake and she had a core track place. Since last medicine consult note patient has a PEG for persistent poor oral intake and throat pain, on hemodialysis for AKI with rapid progression to end-stage renal disease, severe anasarca and pericardial effusion status post window.  She has been recently started on empiric steroids.   Assessment & Plan:   Principal Problem:   Anasarca Active Problems:   Pericardial effusion   Hypertension   Elevated troponin   Atypical pneumonia   Hypoxia   Protein-calorie malnutrition, severe   Pressure injury of skin   Failure to  thrive in adult   Goals of care, counseling/discussion   Dysphagia   Muscular weakness   Thrush of mouth and esophagus (HCC)   ESRD on hemodialysis (HCC)   Dyspnea   S/P thoracentesis  1-Anasarca; FTT, Odynophagia -Thought to be related to Chronic ETOH use and Hepatic Steatosis.  -Continue with HD, and nutrition support.  -Started on IV steroid to treat empirically in case Rheumatology etiology. She has had previous unrevealing Rheumatology evaluation.: CRP 13, ESR 25, ANA negative, CANCA,, PANCA negative, Sclerodermascl 70 negative, SSS (ro) negative, electrophoresis protein; M spike negative. HIV negative,  -I have order labs recommended by Dr Hilma Favors, as miscellaneous; Urine and blood Histamine level.  -Started on Solumedrol by palliative.  - Double strand DNA Negative. -TSH mildly elevated, Free T 3 and T 4 normal.  -Called [facility]; Discussed case with Dr Pincus Sanes, they don't have bed available, wont probably have for several days. No waiting list.  -[facility] Does not have bed available. No waiting list available for intermediate care.  -[facility]; Don't have bed available, couldn't accept patient, no waiting list.,  -I will check  Thiamine level, Anticetromere antibody, Anti RNA Polymerase antibody.  -Received a dose of 125 mg IV solumedrol night of 1-14. On IV solumedrol 40 mg IV daily. Will continue for few days   2-ESRD new on HD;  Progressive AKI, found to be exacerbated by contrast-induced nephropathy. Started on hemodialysis, with improvement of anasarca. Out patient HD has been set up.   3-Acute AMS, Lethargic, Encephalopathy; FTT Patient became lethargic in HD, 1-13 Likely related to medication ativan. She got a dose during HD>  Will limit sedatives. Stop fentanyl, decrease opioids. Stop  ativan.  ABG PCO2 not elevated. CT head Negative for acute abnormalities.  Alert, but very weak.  Will check ammonia level, Thiamine level.   4-Acute Hypoxic Respiratory Failure; Pulmonary  edema, pleural effusion; Complaints of worsening dyspnea.  Chest x ray worsening left pleural effusion/. Pulmonary edema, infiltrates. CCM consulted.  She was started on IV solumedrol by palliative for presume autoimmune process ?? Patient has been more anxious since them. I will decreased dose solumedrol.  Worsening Dyspnea, chest x ray 1-12; worsening pleural effusion. Underwent repeated Thoracentesis 1-12; 1.1 L Left  and 500 cc from right.  Oxygen sat 99 3 L.   3-Pericardial effusion with elevated troponin;  -Status post pericardial window on 12/23, under care of cardiology and CT surgery.   -She had a pericardial effusion as well as pleural effusion that are thought to be stable at this point in time.   4-Hyperkalemia;  Repeat Lokelma.  Had HD today.   5-Failure to Thrive, Severe malnutrition;  -Continue with tube feeding.  6-Atypical PNA with Hypoxia; Completed treatment.   7-Dysphagia Thrush;  On Diflucan.  On tube feeding.  Pepcid.   8-Stage 2 pressure Ulcer;  Continue with local care.   9-Anxiety;  Continue with Zoloft.  Avoid sedatives, became very lethargic during HD 1-13 after 1 mg Ativan,.  10-Back pain;  Continue with fentanyl PRN   11-HTN;  Continue with coreg. Will add norvasc.   Hypernatremia; corrected with HD.   Abdominal pain;  Check KUB. Start PPI   Pressure Injury 07/19/19 Sacrum Posterior;Mid Stage 2 -  Partial thickness loss of dermis presenting as a shallow open injury with a red, pink wound bed without slough. (Active)  07/19/19 0600  Location: Sacrum  Location Orientation: Posterior;Mid  Staging: Stage 2 -  Partial thickness loss of dermis presenting as a shallow open injury with a red, pink wound bed without slough.  Wound Description (Comments):   Present on Admission: No     Pressure Injury 07/22/19 Nare Right;Left Stage 2 -  Partial thickness loss of dermis presenting as a shallow open injury with a red, pink wound bed without  slough. Injury r/t cortrak (Active)  07/22/19 1000  Location: Nare  Location Orientation: Right;Left  Staging: Stage 2 -  Partial thickness loss of dermis presenting as a shallow open injury with a red, pink wound bed without slough.  Wound Description (Comments): Injury r/t cortrak  Present on Admission: No     Nutrition Problem: Severe Malnutrition Etiology: chronic illness(pericardial effusion w/ volume overload)    Signs/Symptoms: moderate fat depletion, severe muscle depletion, percent weight loss    Interventions: Ensure Enlive (each supplement provides 350kcal and 20 grams of protein), Tube feeding  Estimated body mass index is 16.76 kg/m as calculated from the following:   Height as of this encounter: 5' 6"  (1.676 m).   Weight as of this encounter: 47.1 kg.   DVT prophylaxis: Heparin Code Status: full code Family Communication: care discussed with Husband 1-14 Disposition Plan: Transfer to CIR  , no bed available today in CIR Consultants:   Cardiology  CCM  CVTS  Palliative  Nephrology  Procedures:   HD  Pericardial window  Antimicrobials:  None currently   Subjective: Came form HD. Appears very tired and weak, breathing ok, speech is very soft.  She denies joint pain today.  She report abdominal pain, near site peg tube on right site. Had BM 1-14 Husband report patient has had splinter hemorrhage  Objective: Vitals:   08/04/19  0800 08/04/19 0830 08/04/19 0900 08/04/19 0930  BP: (!) 94/56 108/75 122/77 107/73  Pulse: 88 71 79 86  Resp:      Temp:      TempSrc:      SpO2:      Weight:      Height:        Intake/Output Summary (Last 24 hours) at 08/04/2019 1039 Last data filed at 08/03/2019 2203 Gross per 24 hour  Intake 343 ml  Output --  Net 343 ml   Filed Weights   08/03/19 0406 08/04/19 0300 08/04/19 0700  Weight: 49.7 kg 47.1 kg 47.1 kg    Examination:  General exam: Chronic ill appearing, thin Respiratory system: No  significant crackles.  Cardiovascular system: S 1,. S 2 RRR Gastrointestinal system: BS present, soft, nt Central nervous system: Alert, follows command Extremities: Symmetric power.    Data Reviewed: I have personally reviewed following labs and imaging studies  CBC: Recent Labs  Lab 07/30/19 0500 08/02/19 0700 08/04/19 0700  WBC 4.5 9.3 4.4  HGB 9.3* 9.9* 9.2*  HCT 31.2* 32.1* 32.1*  MCV 95.4 96.7 99.1  PLT 230 285 867   Basic Metabolic Panel: Recent Labs  Lab 07/31/19 0509 07/31/19 0509 08/01/19 0500 08/01/19 1455 08/02/19 0413 08/03/19 0440 08/04/19 0306  NA 130*   < > 132* 131* 132* 133* 131*  K 6.2*   < > 6.0* 5.4* 6.1* 5.3* 5.5*  CL 91*   < > 93* 89* 92* 92* 91*  CO2 24   < > 24 23 25 27 24   GLUCOSE 153*   < > 141* 137* 124* 148* 140*  BUN 103*   < > 87* 103* 121* 98* 129*  CREATININE 4.83*   < > 3.94* 4.20* 4.63* 4.02* 4.74*  CALCIUM 9.0   < > 8.5* 8.7* 8.9 8.7* 8.7*  PHOS 7.5*  --  5.8*  --  7.0* 5.3* 7.2*   < > = values in this interval not displayed.   GFR: Estimated Creatinine Clearance: 8.2 mL/min (A) (by C-G formula based on SCr of 4.74 mg/dL (H)). Liver Function Tests: Recent Labs  Lab 07/31/19 0509 08/01/19 0500 08/02/19 0413 08/03/19 0440 08/04/19 0306  ALBUMIN 2.5* 2.5* 2.5* 2.5* 2.5*   No results for input(s): LIPASE, AMYLASE in the last 168 hours. No results for input(s): AMMONIA in the last 168 hours. Coagulation Profile: No results for input(s): INR, PROTIME in the last 168 hours. Cardiac Enzymes: No results for input(s): CKTOTAL, CKMB, CKMBINDEX, TROPONINI in the last 168 hours. BNP (last 3 results) No results for input(s): PROBNP in the last 8760 hours. HbA1C: No results for input(s): HGBA1C in the last 72 hours. CBG: Recent Labs  Lab 08/03/19 1150 08/03/19 1607 08/03/19 1942 08/03/19 2346 08/04/19 0411  GLUCAP 116* 121* 119* 119* 107*   Lipid Profile: No results for input(s): CHOL, HDL, LDLCALC, TRIG, CHOLHDL,  LDLDIRECT in the last 72 hours. Thyroid Function Tests: Recent Labs    08/02/19 1433 08/02/19 1434  TSH 5.768*  --   FREET4 0.89  --   T3FREE  --  2.3   Anemia Panel: No results for input(s): VITAMINB12, FOLATE, FERRITIN, TIBC, IRON, RETICCTPCT in the last 72 hours. Sepsis Labs: No results for input(s): PROCALCITON, LATICACIDVEN in the last 168 hours.  Recent Results (from the past 240 hour(s))  Culture, group A strep     Status: None   Collection Time: 07/26/19 12:21 PM   Specimen: Throat  Result Value Ref Range  Status   Specimen Description THROAT  Final   Special Requests NONE  Final   Culture   Final    NO GROUP A STREP (S.PYOGENES) ISOLATED Performed at [facility], [address] [place], [place] [address]    Report Status 07/28/2019 FINAL  Final         Radiology Studies: CT HEAD WO CONTRAST  Result Date: 08/02/2019 CLINICAL DATA:  Encephalopathy EXAM: CT HEAD WITHOUT CONTRAST TECHNIQUE: Contiguous axial images were obtained from the base of the skull through the vertex without intravenous contrast. COMPARISON:  None. FINDINGS: Brain: No evidence of acute infarction, hemorrhage, hydrocephalus, extra-axial collection or mass lesion/mass effect. Mild periventricular white matter hypodensity. Vascular: No hyperdense vessel or unexpected calcification. Skull: Normal. Negative for fracture or focal lesion. Sinuses/Orbits: No acute finding. Other: None. IMPRESSION: No acute intracranial pathology.  Small-vessel white matter disease. Electronically Signed   By: Eddie Candle M.D.   On: 08/02/2019 16:13        Scheduled Meds: . amLODipine  10 mg Oral Daily  . carvedilol  3.125 mg Oral BID WC  . Chlorhexidine Gluconate Cloth  6 each Topical Q0600  . Chlorhexidine Gluconate Cloth  6 each Topical Q0600  . collagenase   Topical BID  . darbepoetin (ARANESP) injection - NON-DIALYSIS  100 mcg Subcutaneous Q Sat-1800  . diphenhydrAMINE  25 mg Oral Once  . famotidine   10 mg Oral Daily  . feeding supplement (NEPRO CARB STEADY)  1,000 mL Per Tube Q24H  . feeding supplement (PRO-STAT SUGAR FREE 64)  30 mL Per Tube Daily  . fluconazole  200 mg Oral Daily  . gabapentin  100 mg Oral BID  . Gerhardt's butt cream   Topical TID  . heparin  5,000 Units Subcutaneous Q8H  . loratadine  10 mg Oral Daily  . mouth rinse  15 mL Mouth Rinse BID  . methylPREDNISolone (SOLU-MEDROL) injection  20 mg Intravenous Daily  . neomycin-bacitracin-polymyxin   Topical BID  . nystatin  5 mL Mouth/Throat QID  . prochlorperazine  5 mg Oral Once  . sertraline  25 mg Oral BID  . sodium chloride flush  3 mL Intravenous Q12H   Continuous Infusions: . sodium chloride    . sodium chloride    . sodium chloride    . dextrose Stopped (07/29/19 1707)     LOS: 31 days    Time spent: 35 minutes    Seiji Wiswell A Reeve Mallo, MD [facility]   If 7PM-7AM, please contact night-coverage [email or web address] Password [facility] 08/04/2019, 10:39 AM

## 2019-08-04 NOTE — Progress Notes (Signed)
Occupational Therapy Treatment Patient Details Name: Joanna Ali MRN: [id number] DOB: 04/26/1951 Today's Date: 08/04/2019    History of present illness Pt is a 69 y.o. female admitted 07/03/19 with anasarca, fatigue and tachycardia. 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 Pt with pericardial effusion s/p pericardial window. PEG tube placed. PMH includes former tobacco and alcohol abuse, HTN, chronic pleural effusion.   OT comments  Pt continues to be profoundly weak, requiring total to +2 max assist for all mobility and total assist for ADL. Pt with c/o pain and nausea throughout session and opening eyes minimally. She was unable to take side steps toward [facility] today. Updated d/c recommendation to SNF and down graded goals.  Follow Up Recommendations  SNF;Supervision/Assistance - 24 hour    Equipment Recommendations  Hospital bed;Wheelchair (measurements OT);Wheelchair cushion (measurements OT);3 in 1 bedside commode    Recommendations for Other Services      Precautions / Restrictions Precautions Precautions: Fall Precaution Comments: PEG tube Restrictions Weight Bearing Restrictions: No       Mobility Bed Mobility Overal bed mobility: Needs Assistance Bed Mobility: Supine to Sit;Sit to Supine     Supine to sit: HOB elevated;Total assist Sit to supine: Total assist;HOB elevated   General bed mobility comments: Pt is light, but does not initiate any active motion to facilitate transfers  Transfers Overall transfer level: Needs assistance Equipment used: 2 person hand held assist Transfers: Sit to/from Stand Sit to Stand: Max assist;+2 physical assistance         General transfer comment: unable to take any side steps this visit    Balance Overall balance assessment: Needs assistance Sitting-balance support: Feet supported;No upper extremity supported Sitting balance-Leahy Scale: Fair Sitting balance - Comments: Min guard assist.    Standing balance support: During functional activity Standing balance-Leahy Scale: Poor Standing balance comment: Requires UE support and external support, posterior lean.                           ADL either performed or assessed with clinical judgement   ADL                                         General ADL Comments: requiring total assist      Vision       Perception     Praxis      Cognition Arousal/Alertness: Awake/alert;Lethargic Behavior During Therapy: Flat affect Overall Cognitive Status: Impaired/Different from baseline Area of Impairment: Attention;Following commands;Safety/judgement;Awareness;Problem solving                   Current Attention Level: Focused;Sustained   Following Commands: Follows one step commands with increased time;Follows one step commands inconsistently Safety/Judgement: Decreased awareness of safety;Decreased awareness of deficits Awareness: Intellectual;Emergent Problem Solving: Difficulty sequencing;Decreased initiation;Requires verbal cues;Requires tactile cues;Slow processing General Comments: pt smiles intermittantly at jokes, but with minimal responsiveness otherwise, minimal communication usually in the form of small nods. Pt with eyes closed for most of session, opens with repeated commands.        Exercises     Shoulder Instructions       General Comments spouse present, VSS, pt with sig fatigue following dialysis    Pertinent Vitals/ Pain       Pain Assessment: Faces Faces Pain Scale: Hurts even more Pain Location: generalized, nausea Pain  Descriptors / Indicators: Grimacing;Moaning Pain Intervention(s): Monitored during session;Repositioned;Premedicated before session  Home Living                                          Prior Functioning/Environment              Frequency  Min 2X/week        Progress Toward Goals  OT Goals(current goals can now  be found in the care plan section)  Progress towards OT goals: Not progressing toward goals - comment(pt profoundly weak)  Acute Rehab OT Goals Patient Stated Goal: to get stronger and go home; get back to playing golf OT Goal Formulation: With family Time For Goal Achievement: 08/18/19 Potential to Achieve Goals: [place] Discharge plan needs to be updated    Co-evaluation      Reason for Co-Treatment: Complexity of the patient's impairments (multi-system involvement);For patient/therapist safety;To address functional/ADL transfers;Other (comment)(pt with decreased tolerance for activity) PT goals addressed during session: Mobility/safety with mobility;Balance;Strengthening/ROM OT goals addressed during session: Strengthening/ROM      AM-PAC OT "6 Clicks" Daily Activity     Outcome Measure   Help from another person eating meals?: Total Help from another person taking care of personal grooming?: Total Help from another person toileting, which includes using toliet, bedpan, or urinal?: Total Help from another person bathing (including washing, rinsing, drying)?: Total Help from another person to put on and taking off regular upper body clothing?: Total Help from another person to put on and taking off regular lower body clothing?: Total 6 Click Score: 6    End of Session    OT Visit Diagnosis: Unsteadiness on feet (R26.81);Pain;Muscle weakness (generalized) (M62.81);Other symptoms and signs involving cognitive function   Activity Tolerance Patient limited by lethargy;Patient limited by fatigue;Patient limited by pain(nausea)   Patient Left in bed;with call bell/phone within reach;with family/visitor present   Nurse Communication          Time: 2774-1287 OT Time Calculation (min): 29 min  Charges: OT General Charges $OT Visit: 1 Visit OT Treatments $Therapeutic Activity: 8-22 mins  [name], OTR/L Acute Rehabilitation Services Pager: [phone] Office:  [phone]   [name] 08/04/2019, 3:48 PM

## 2019-08-04 NOTE — Procedures (Signed)
Patient was seen on dialysis and the procedure was supervised.  BFR 400  Via TDC BP is  108/75.   Patient somnolent or crying out.  Attempting more volume removal today   Joanna Ali 08/04/2019

## 2019-08-04 NOTE — Progress Notes (Signed)
Physical Therapy Treatment Patient Details Name: Joanna Ali MRN: [id number] DOB: [birth year] Today's Date: 08/04/2019    History of Present Illness Pt is a 69 y.o. female admitted 07/03/19 with anasarca, fatigue and tachycardia. 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 Pt with pericardial effusion s/p pericardial window. PEG tube placed. PMH includes former tobacco and alcohol abuse, HTN, chronic pleural effusion.    PT Comments    Pt in bed with spouse present upon PT/OT arrival, pt agreeable to participate in PT session despite sig fatigue from dialysis today. The pt continues to present with significant limitations in mobility, independence, strength, endurance, and stability due to above dx and hospital course. The pt demos decrease in initiation of functional mobility today despite encouragement and assist from PT/OT. The pt required max/total assist to come to sitting EOB, and required max of 2 to complete stand from EOB. The pt was unable to take any steps, even with max assist, and was only able to complete 3 reaching tasks which required sig extra time and encouragement as pt was minimally engaged and required repeated stimuli to maintain eyes open through the session. At this point, given the pt's presentation I do not think she is appropriate for the intensive therapies of CIR due to her sig deficits in strength and tolerance for activity. The pt will still benefit from PT acutely to maximize mobility, strength and independence while in the hospital.    Follow Up Recommendations  SNF;Supervision/Assistance - 24 hour     Equipment Recommendations  Rolling walker with 5" wheels    Recommendations for Other Services       Precautions / Restrictions Precautions Precautions: Fall;Other (comment) Precaution Comments: PEG tube Restrictions Weight Bearing Restrictions: No    Mobility  Bed Mobility Overal bed mobility: Needs Assistance Bed Mobility: Supine  to Sit;Sit to Supine     Supine to sit: HOB elevated;Total assist Sit to supine: Total assist;HOB elevated   General bed mobility comments: Pt is light, but does not initiate any active motion to facilitate transfers  Transfers Overall transfer level: Needs assistance Equipment used: 2 person hand held assist Transfers: Sit to/from Stand Sit to Stand: Max assist         General transfer comment: Pt completed 1 STS transfer, but requires sig assist of 2 to complete, and then wa unable to lift or move either LE to move towards [place].  Ambulation/Gait Ambulation/Gait assistance: (unable)               Stairs             Wheelchair Mobility    Modified Rankin (Stroke Patients Only)       Balance Overall balance assessment: Needs assistance Sitting-balance support: Feet supported;No upper extremity supported Sitting balance-Leahy Scale: Fair     Standing balance support: During functional activity Standing balance-Leahy Scale: Poor Standing balance comment: Requires UE support and external support, posterior lean.                            Cognition Arousal/Alertness: Awake/alert;Lethargic Behavior During Therapy: Flat affect Overall Cognitive Status: Impaired/Different from baseline Area of Impairment: Attention;Following commands;Safety/judgement;Awareness;Problem solving                   Current Attention Level: Focused;Sustained   Following Commands: Follows one step commands with increased time;Follows one step commands inconsistently Safety/Judgement: Decreased awareness of safety;Decreased awareness of deficits Awareness: Intellectual;Emergent  Problem Solving: Difficulty sequencing;Decreased initiation;Requires verbal cues;Requires tactile cues;Slow processing General Comments: pt smiles intermittantly at jokes, but with minimal responsiveness otherwise, minimal communication usually in the form of small nods. Pt with eyes closed  for most of session, opens with repeated commands.      Exercises      General Comments General comments (skin integrity, edema, etc.): spouse present, VSS, pt with sig fatigue following dialysis      Pertinent Vitals/Pain Pain Assessment: Faces Faces Pain Scale: Hurts even more Pain Location: generalized, nausea Pain Descriptors / Indicators: Grimacing;Moaning Pain Intervention(s): Limited activity within patient's tolerance;Monitored during session    Home Living                      Prior Function            PT Goals (current goals can now be found in the care plan section) Acute Rehab PT Goals Patient Stated Goal: to get stronger and go home; get back to playing golf PT Goal Formulation: With patient/family Time For Goal Achievement: 08/17/19 Potential to Achieve Goals: Fair Progress towards PT goals: Not progressing toward goals - comment(pt with sig fatigue and decline in function today, she was unable to intiate movements for bed mobility or ambulation, will continue to assess.)    Frequency    Min 2X/week      PT Plan Discharge plan needs to be updated;Frequency needs to be updated    Co-evaluation PT/OT/SLP Co-Evaluation/Treatment: Yes Reason for Co-Treatment: Complexity of the patient's impairments (multi-system involvement);For patient/therapist safety;To address functional/ADL transfers;Other (comment)(pt with decreased tolerance for activity) PT goals addressed during session: Mobility/safety with mobility;Balance;Strengthening/ROM        AM-PAC PT "6 Clicks" Mobility   Outcome Measure  Help needed turning from your back to your side while in a flat bed without using bedrails?: A Lot Help needed moving from lying on your back to sitting on the side of a flat bed without using bedrails?: A Lot Help needed moving to and from a bed to a chair (including a wheelchair)?: A Lot Help needed standing up from a chair using your arms (e.g.,  wheelchair or bedside chair)?: A Lot Help needed to walk in hospital room?: Total Help needed climbing 3-5 steps with a railing? : Total 6 Click Score: 10    End of Session   Activity Tolerance: Patient limited by pain;Patient limited by fatigue Patient left: in bed;with call bell/phone within reach;with bed alarm set;with family/visitor present Nurse Communication: Mobility status PT Visit Diagnosis: Unsteadiness on feet (R26.81);Muscle weakness (generalized) (M62.81);Difficulty in walking, not elsewhere classified (R26.2);Pain Pain - Right/Left: (generalized) Pain - part of body: (generalized?)     Time: 7530-0511 PT Time Calculation (min) (ACUTE ONLY): 27 min  Charges:  $Therapeutic Activity: 8-22 mins                     Karma Ganja, PT, DPT   Acute Rehabilitation Department Pager #: [phone]   Otho Bellows 08/04/2019, 3:37 PM

## 2019-08-04 NOTE — Progress Notes (Signed)
Spoke with Joanna Ali last PM at length about Joanna Ali condition and my concerns re: continued decline with limited options other than supportive care with hemodialysis, management of Joanna Ali recurrent effusions and nutritional support as well as Joanna Ali poorly controlled pain and suffering. His goal was "to get Joanna Ali strong enough to make it to the outpatient rheumatology appoint I was able to get Joanna Ali in [facility]". He knows in Joanna Ali current condition this is probably not going to happen. 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 We also discussed the role of steroids-risks benefits and uncertainty. We decided on one more trial of high dose steroids- if not helpful or severely exacerbating Joanna Ali anxiety will discontinue.  He is open to transfer to any hospital where rheumatology consultation can be obtained. I will work on seeing if we can get someone with Rheumatology in the [facility] system to do a virtual consult and provide some guidance.  Added PRN IV fentanyl for pain control-would avoid morphine and minimize use of benzodiazepines.  Lane Hacker, DO Palliative Medicine

## 2019-08-04 NOTE — Progress Notes (Signed)
[facility] Progress Note    Assessment/ Plan:   Pt is a 69 y.o. yo female  with history of HTN,  hepatic steatosis with low albumin, chronic pericardial effusion, moderate AR admitted on 12/17 for progressive dyspnea, anasarca , seen as a consultationfor AKIand fluid overload.  #Acute kidney injury,nonoliguric (crt normal in Sept, was 1.43 on 07/03/19) : Likely hemodynamically mediated, possible HRS concomitant with use of IV contrast- U/A not remarkable.  Autoimmune work-up unremarkable.   Started HD 07/18/19 -  IR placed TDC.  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  Starting CLIP process.  Based on how she has looked lately I really dont think she is a candidate for OP HD.  Tentatively has a spot in [place].  Next HD today --  longer treatment given steroids and volume   From a renal perspective I think empiric trial of steroids is fine  but there has been no evidence for GN and I don't anticipate any direct benefit or toxicity to the kidneys.     #Hyperkalemia:  Has been having intermittent hyperK.  Try to avoid lokelma as it irritated throat. - now on nepro.  Needed lokelma this week unfortunately - steroids are not going to help this issue   #Anasarca presumably due to liver disease which was diagnosed at [facility]. She has a history of liver steatosis.No evidence of GN/nephrotic syndrome. 3rd spacing also. volume status much improved but is still an issue   #Moderate to large pericardial effusion:  s/p pericardial window on 12/23- TCTS following, stable  #Hypertension: Blood pressure acceptable. if anything is high on low dose Coreg. We need to try and UF more as able . #Anemia: s/p transfusion on 12/22.  gave feraheme and on ESA weekly.  Hb stable in the 9's    #Hyperphosphatemia:  Improved with dialysis.  Not her biggest issue right now-  Given her throat issues have not started binder  **Malnutrition:  S/p G  tube 1/8 -  Albumin low   Dispo:    Palliative care is involved in [place] discussions. This has been a very long road for her.  Rehab will take once has OP dialysis seat.  She does not look appropriate for rehab-  Palliative resuming steroids and giving fentanyl -  Considering transfer to tertiary care center.  I share her concern that this is not going to have a good outcome she seems miserable    Subjective:    Seen on HD-  Minimally responsive but occasionally crying out.  Has been getting fentanyl for her pain.  I appreciate palliative so much. High dose steroids have been resumed.  Unfortunately in spite of that she has deteriorated clinically to my eye.  Thinking about transfer to tertiary care center    Objective:   BP 118/81   Pulse 83   Temp (!) 97.5 F (36.4 C) (Oral)   Resp 20   Ht 5' 6"  (1.676 m)   Wt 47.1 kg   SpO2 100%   BMI 16.76 kg/m   Intake/Output Summary (Last 24 hours) at 08/04/2019 0850 Last data filed at 08/03/2019 2203 Gross per 24 hour  Intake 409 ml  Output 40 ml  Net 369 ml   Weight change: -6 kg  Physical Exam: Gen: cachectic, inc RR-  Somnolent to crying out  CVS: RRR, well-healing scars Resp: normal WOB Abd: thin, mildly painful to palpation Ext: + flank edema ACCESS: L TDC c/d/i  Imaging: CT HEAD WO CONTRAST  Result Date: 08/02/2019 CLINICAL DATA:  Encephalopathy EXAM: CT HEAD WITHOUT CONTRAST TECHNIQUE: Contiguous axial images were obtained from the base of the skull through the vertex without intravenous contrast. COMPARISON:  None. FINDINGS: Brain: No evidence of acute infarction, hemorrhage, hydrocephalus, extra-axial collection or mass lesion/mass effect. Mild periventricular white matter hypodensity. Vascular: No hyperdense vessel or unexpected calcification. Skull: Normal. Negative for fracture or focal lesion. Sinuses/Orbits: No acute finding. Other: None. IMPRESSION: No acute intracranial pathology.  Small-vessel white matter disease.  Electronically Signed   By: Eddie Candle M.D.   On: 08/02/2019 16:13    Labs: BMET Recent Labs  Lab 07/29/19 0348 07/29/19 0348 07/30/19 0500 07/31/19 0509 08/01/19 0500 08/01/19 1455 08/02/19 0413 08/03/19 0440 08/04/19 0306  NA 133*   < > 130* 130* 132* 131* 132* 133* 131*  K 4.8   < > 5.8* 6.2* 6.0* 5.4* 6.1* 5.3* 5.5*  CL 92*   < > 92* 91* 93* 89* 92* 92* 91*  CO2 28   < > 27 24 24 23 25 27 24   GLUCOSE 88   < > 140* 153* 141* 137* 124* 148* 140*  BUN 40*   < > 67* 103* 87* 103* 121* 98* 129*  CREATININE 3.11*   < > 4.15* 4.83* 3.94* 4.20* 4.63* 4.02* 4.74*  CALCIUM 8.4*   < > 8.7* 9.0 8.5* 8.7* 8.9 8.7* 8.7*  PHOS 4.8*  --  7.4* 7.5* 5.8*  --  7.0* 5.3* 7.2*   < > = values in this interval not displayed.   CBC Recent Labs  Lab 07/30/19 0500 08/02/19 0700 08/04/19 0700  WBC 4.5 9.3 4.4  HGB 9.3* 9.9* 9.2*  HCT 31.2* 32.1* 32.1*  MCV 95.4 96.7 99.1  PLT 230 285 274    Medications:    . amLODipine  10 mg Oral Daily  . carvedilol  3.125 mg Oral BID WC  . Chlorhexidine Gluconate Cloth  6 each Topical Q0600  . Chlorhexidine Gluconate Cloth  6 each Topical Q0600  . collagenase   Topical BID  . darbepoetin (ARANESP) injection - NON-DIALYSIS  100 mcg Subcutaneous Q Sat-1800  . diphenhydrAMINE  25 mg Oral Once  . famotidine  10 mg Oral Daily  . feeding supplement (NEPRO CARB STEADY)  1,000 mL Per Tube Q24H  . feeding supplement (PRO-STAT SUGAR FREE 64)  30 mL Per Tube Daily  . fentaNYL      . fluconazole  200 mg Oral Daily  . gabapentin  100 mg Oral BID  . Gerhardt's butt cream   Topical TID  . heparin  5,000 Units Subcutaneous Q8H  . loratadine  10 mg Oral Daily  . mouth rinse  15 mL Mouth Rinse BID  . methylPREDNISolone (SOLU-MEDROL) injection  20 mg Intravenous Daily  . neomycin-bacitracin-polymyxin   Topical BID  . nystatin  5 mL Mouth/Throat QID  . prochlorperazine  5 mg Oral Once  . sertraline  25 mg Oral BID  . sodium chloride flush  3 mL Intravenous  Q12H      [facility]  08/04/2019, 8:50 AM

## 2019-08-05 ENCOUNTER — Inpatient Hospital Stay (HOSPITAL_COMMUNITY): Payer: Medicare Other

## 2019-08-05 LAB — GLUCOSE, CAPILLARY
Glucose-Capillary: 101 mg/dL — ABNORMAL HIGH (ref 70–99)
Glucose-Capillary: 107 mg/dL — ABNORMAL HIGH (ref 70–99)
Glucose-Capillary: 113 mg/dL — ABNORMAL HIGH (ref 70–99)
Glucose-Capillary: 122 mg/dL — ABNORMAL HIGH (ref 70–99)
Glucose-Capillary: 90 mg/dL (ref 70–99)
Glucose-Capillary: 95 mg/dL (ref 70–99)

## 2019-08-05 LAB — RENAL FUNCTION PANEL
Albumin: 3.2 g/dL — ABNORMAL LOW (ref 3.5–5.0)
Anion gap: 15 (ref 5–15)
BUN: 79 mg/dL — ABNORMAL HIGH (ref 8–23)
CO2: 24 mmol/L (ref 22–32)
Calcium: 9.1 mg/dL (ref 8.9–10.3)
Chloride: 91 mmol/L — ABNORMAL LOW (ref 98–111)
Creatinine, Ser: 3.17 mg/dL — ABNORMAL HIGH (ref 0.44–1.00)
GFR calc Af Amer: 16 mL/min — ABNORMAL LOW (ref >60)
GFR calc non Af Amer: 14 mL/min — ABNORMAL LOW (ref >60)
Glucose, Bld: 149 mg/dL — ABNORMAL HIGH (ref 70–99)
Phosphorus: 5.2 mg/dL — ABNORMAL HIGH (ref 2.5–4.6)
Potassium: 4.6 mmol/L (ref 3.5–5.1)
Sodium: 130 mmol/L — ABNORMAL LOW (ref 135–145)

## 2019-08-05 LAB — HEPATITIS PANEL, ACUTE
HCV Ab: NONREACTIVE
Hep A IgM: NONREACTIVE
Hep B C IgM: NONREACTIVE
Hepatitis B Surface Ag: NONREACTIVE

## 2019-08-05 LAB — MISC LABCORP TEST (SEND OUT): Labcorp test code: 164814

## 2019-08-05 LAB — VITAMIN D 25 HYDROXY (VIT D DEFICIENCY, FRACTURES): Vit D, 25-Hydroxy: 38.07 ng/mL (ref 30–100)

## 2019-08-05 LAB — RHEUMATOID FACTOR: Rheumatoid fact SerPl-aCnc: 71.4 IU/mL — ABNORMAL HIGH (ref 0.0–13.9)

## 2019-08-05 LAB — ANA: Anti Nuclear Antibody (ANA): NEGATIVE

## 2019-08-05 MED ORDER — CHLORHEXIDINE GLUCONATE CLOTH 2 % EX PADS
6.0000 | MEDICATED_PAD | Freq: Every day | CUTANEOUS | Status: DC
  Administered 2019-08-06 – 2019-08-14 (×9): 6 via TOPICAL

## 2019-08-05 MED ORDER — METHYLPREDNISOLONE SODIUM SUCC 125 MG IJ SOLR
125.0000 mg | Freq: Every day | INTRAMUSCULAR | Status: DC
  Filled 2019-08-05: qty 2

## 2019-08-05 MED ORDER — METHYLPREDNISOLONE SODIUM SUCC 125 MG IJ SOLR
125.0000 mg | Freq: Two times a day (BID) | INTRAMUSCULAR | Status: DC
  Administered 2019-08-05: 125 mg via INTRAVENOUS
  Filled 2019-08-05: qty 2

## 2019-08-05 NOTE — Progress Notes (Signed)
Joanna Ali Progress Note    Assessment/ Plan:   Pt is a 69 y.o. yo female  with history of HTN,  hepatic steatosis with low albumin, chronic pericardial effusion, moderate AR admitted on 12/17 for progressive dyspnea, anasarca , seen as a consultationfor AKIand fluid overload.  #Acute kidney injury,nonoliguric (crt normal in Sept, was 1.43 on 07/03/19) : Likely hemodynamically mediated, possible HRS concomitant with use of IV contrast- U/A not remarkable.  Autoimmune work-up unremarkable.   Started HD 07/18/19 -  TDC.  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  Starting CLIP process.  Based on how she has looked lately I really dont think she is a candidate for OP HD.  Tentatively has a spot in [place].  Next HD planned for Monday --  longer treatment given steroids and volume   From a renal perspective I think empiric trial of steroids is fine  - don't anticipate any direct benefit or toxicity to the kidneys.     #Hyperkalemia:  Has been having intermittent hyperK.  Try to avoid lokelma as it irritated throat. - now on nepro.  Needed lokelma this week unfortunately - steroids are not going to help this issue   #Anasarca presumably due to liver disease which was diagnosed at [facility]. She has a history of liver steatosis.No evidence of GN/nephrotic syndrome. 3rd spacing also. volume status much improved but is still an issue   #Moderate to large pericardial effusion:  s/p pericardial window on 12/23- TCTS following, stable  #Hypertension: Blood pressure acceptable. if anything is high on low dose Coreg. We need to try and UF more as able . #Anemia: s/p transfusion on 12/22.  gave feraheme and on ESA weekly.  Hb stable in the 9's    #Hyperphosphatemia:  Improved with dialysis.  Not her biggest issue right now-  Given her throat issues have not started binder  **Malnutrition:  S/p G tube 1/8 -  Albumin low   Dispo:     Palliative care is involved in [place] discussions. This has been a very long road for her.  Rehab will take once has OP dialysis seat.  She does not look appropriate for rehab-  Palliative resuming steroids and giving fentanyl -  Considering transfer to tertiary care center.  I share her concern that this is not going to have a good outcome she seems miserable    Subjective:    Had Hd yest-  Removed 3 liters-  Tolerated fine hemodynamically but cont to be behavioral issues.  See that pt is max assist times 2 for any mobility per PT and OT.  She is actually sitting up on side of bed this AM  "im tired, im sick, none of the meds work"  "can I get this stuff off my chest and waist (speaking of the monitor)     Objective:   BP 127/88   Pulse 86   Temp 97.6 F (36.4 C) (Oral)   Resp (!) 21   Ht 5' 6"  (1.676 m)   Wt 44.9 kg   SpO2 100%   BMI 15.98 kg/m   Intake/Output Summary (Last 24 hours) at 08/05/2019 0704 Last data filed at 08/05/2019 0600 Gross per 24 hour  Intake 481 ml  Output 3130 ml  Net -2649 ml   Weight change: -3.4 kg  Physical Exam: Gen: cachectic, inc RR-  Somnolent to crying out  CVS: RRR, well-healing scars Resp: normal WOB Abd: thin, mildly painful  to palpation Ext: + flank edema ACCESS: L TDC c/d/i  Imaging: DG Abd Portable 1V  Result Date: 08/04/2019 CLINICAL DATA:  Abdominal pain with tube feeding. EXAM: PORTABLE ABDOMEN - 1 VIEW COMPARISON:  Radiographs dated 07/28/2019 and 07/27/2019 FINDINGS: The gastrostomy tube is in the body of the stomach, unchanged in position since the prior study of 07/28/2019. The bowel gas pattern is normal. No bone abnormality. IMPRESSION: Benign-appearing abdomen. The gastrostomy tube is in the body of the stomach. Electronically Signed   By: Lorriane Shire M.D.   On: 08/04/2019 13:16    Labs: BMET Recent Labs  Lab 07/30/19 0500 07/30/19 0500 07/31/19 0509 08/01/19 0500 08/01/19 1455 08/02/19 0413 08/03/19 0440  08/04/19 0306 08/05/19 0539  NA 130*   < > 130* 132* 131* 132* 133* 131* 130*  K 5.8*   < > 6.2* 6.0* 5.4* 6.1* 5.3* 5.5* 4.6  CL 92*   < > 91* 93* 89* 92* 92* 91* 91*  CO2 27   < > 24 24 23 25 27 24 24   GLUCOSE 140*   < > 153* 141* 137* 124* 148* 140* 149*  BUN 67*   < > 103* 87* 103* 121* 98* 129* 79*  CREATININE 4.15*   < > 4.83* 3.94* 4.20* 4.63* 4.02* 4.74* 3.17*  CALCIUM 8.7*   < > 9.0 8.5* 8.7* 8.9 8.7* 8.7* 9.1  PHOS 7.4*  --  7.5* 5.8*  --  7.0* 5.3* 7.2* 5.2*   < > = values in this interval not displayed.   CBC Recent Labs  Lab 07/30/19 0500 08/02/19 0700 08/04/19 0700  WBC 4.5 9.3 4.4  HGB 9.3* 9.9* 9.2*  HCT 31.2* 32.1* 32.1*  MCV 95.4 96.7 99.1  PLT 230 285 274    Medications:    . amLODipine  10 mg Oral Daily  . carvedilol  3.125 mg Oral BID WC  . Chlorhexidine Gluconate Cloth  6 each Topical Q0600  . Chlorhexidine Gluconate Cloth  6 each Topical Q0600  . collagenase   Topical BID  . darbepoetin (ARANESP) injection - NON-DIALYSIS  100 mcg Subcutaneous Q Sat-1800  . diphenhydrAMINE  25 mg Oral Once  . famotidine  10 mg Oral Daily  . feeding supplement (NEPRO CARB STEADY)  1,000 mL Per Tube Q24H  . feeding supplement (PRO-STAT SUGAR FREE 64)  30 mL Per Tube Daily  . fluconazole  200 mg Oral Daily  . gabapentin  100 mg Oral BID  . Gerhardt's butt cream   Topical TID  . heparin  5,000 Units Subcutaneous Q8H  . loratadine  10 mg Oral Daily  . mouth rinse  15 mL Mouth Rinse BID  . methylPREDNISolone (SOLU-MEDROL) injection  40 mg Intravenous Daily  . neomycin-bacitracin-polymyxin   Topical BID  . nystatin  5 mL Mouth/Throat QID  . pantoprazole (PROTONIX) IV  40 mg Intravenous Q12H  . sertraline  25 mg Oral BID  . sodium chloride flush  3 mL Intravenous Q12H      [facility]  08/05/2019, 7:04 AM

## 2019-08-05 NOTE — Progress Notes (Signed)
PROGRESS NOTE    Joanna Ali  UXN:[id number] DOB: [birth year] DOA: 07/03/2019 PCP: Renaldo Reel, DO   Brief Narrative: 69 year old with past medical history significant for hypertension, chronic pericardial effusion, moderate aortic valve regurgitation, former smoker, alcohol use.  Initially admitted on 12/17 for progressive shortness of breath with anasarca and fatigue.  Previously worked up by [facility] rheumatology with negative work-up including ESR, lupus anticoagulant, rheumatoid factor, TSH, ANCA, ANA and light free chain studies.  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  Since that time patient had stopped drinking alcohol as advised.  She had been on diuretics and spironolactone without any improvement in symptoms.  CT angiogram of the chest on 12/15 noted moderate pericardial effusion and airspace disease.  She underwent thoracentesis by PCCM which removed 700 cc of fluid.  Patient had undergone right heart cath on 12/17 and underwent pericardial window on 12/23.  Attempts were made with IV diuretics but patient developed worsening kidney function with creatinine up to 5.  Nephrology was formally consulted and patient was a started on dialysis.  Patient has been having poor oral intake and she had a core track place. Since last medicine consult note patient has a PEG for persistent poor oral intake and throat pain, on hemodialysis for AKI with rapid progression to end-stage renal disease, severe anasarca and pericardial effusion status post window.  She has been recently started on empiric steroids.   Assessment & Plan:   Principal Problem:   Anasarca Active Problems:   Pericardial effusion   Hypertension   Elevated troponin   Atypical pneumonia   Hypoxia   Protein-calorie malnutrition, severe   Pressure injury of skin   Failure to  thrive in adult   Goals of care, counseling/discussion   Dysphagia   Muscular weakness   Thrush of mouth and esophagus (HCC)   ESRD on hemodialysis (HCC)   Dyspnea   S/P thoracentesis  1-Anasarca; FTT, Odynophagia, Joint pains.  -Thought to be related to Chronic ETOH use and Hepatic Steatosis.  -Continue with HD, and nutrition support.  -Started on IV steroid to treat empirically in case Rheumatology etiology.  -Patient had CT abdomen plevis, CT chest back in September negative for lymphadenopathy or mass.  -Rheumatology evaluation September 2020.: CRP 13, ESR 25, ANA negative, CANCA,, PANCA negative, Sclerodermas cl 70 negative, SSS (ro) negative, electrophoresis protein; M spike negative. HIV negative,  -I have order labs recommended by Dr Hilma Favors, as miscellaneous; Urine and blood Histamine level. Histamine 0.283 NL. -Started on Solumedrol by palliative.  - Double strand DNA Negative. ANA negative, FR 71.  -TSH mildly elevated, Free T 3 and T 4 normal.  -Contacted [facility] again today 08-05-2019 for transfer, and update on RF results, they decline transfer, no bed available.  -Thiamine level Pending, Anticetromere antibody 0.2 negative, Anti RNA Polymerase antibody pending -Received a dose of 125 mg IV solumedrol night of 1-14. -RF at 71, increased  IV solumedrol to 125 mg. -Discussed with Rheumatology from [facility]; recommendations; check CCP level, Viral Hepatitis panel. If CCP is positive more likely RA. Could do Steroid 1 mg/Kg start tomorrow, now she received dose of 125 mg IV solumedrol.   2-ESRD new on HD;  Progressive AKI, found to be exacerbated by contrast-induced nephropathy. Started on hemodialysis, with improvement of anasarca. Out patient HD has been set up.   3-Acute AMS,  Lethargic, Encephalopathy; FTT Patient became lethargic in HD, 1-13 Likely related to medication ativan. She got a dose during HD>  Will limit sedatives. Stop fentanyl, decrease opioids. Stop  ativan.  ABG PCO2 not elevated. CT head Negative for acute abnormalities.  Ammonia 29, Thiamine level pending.  IV thiamine 250 mg TID for 3 days.   4-Acute Hypoxic Respiratory Failure; Pulmonary edema, pleural effusion; Complaints of worsening dyspnea.  Chest x ray worsening left pleural effusion/. Pulmonary edema, infiltrates. CCM consulted.  She was started on IV solumedrol by palliative for presume autoimmune process ?? Patient has been more anxious since them. I will decreased dose solumedrol.  Worsening Dyspnea, chest x ray 1-12; worsening pleural effusion. Underwent repeated Thoracentesis 1-12; 1.1 L Left  and 500 cc from right.  Oxygen sat 99 3 L.  Plan to repeat Chest x ray tomorrow.   3-Pericardial effusion with elevated troponin;  -Status post pericardial window on 12/23, under care of cardiology and CT surgery.   -She had a pericardial effusion as well as pleural effusion that are thought to be stable at this point in time.   4-Hyperkalemia;  K normal today  Had HD 1-15  5-Failure to Thrive, Severe malnutrition;  -Continue with tube feeding.  6-Atypical PNA with Hypoxia; Completed treatment.   7-Dysphagia Thrush;  On Diflucan.  On tube feeding.  Pepcid.   8-Stage 2 pressure Ulcer;  Continue with local care.   9-Anxiety;  Continue with Zoloft.  Avoid sedatives, became very lethargic during HD 1-13 after 1 mg Ativan,.  10-Back pain;  Continue with fentanyl PRN   11-HTN;  Continue with coreg. Will add norvasc.   Hypernatremia; corrected with HD.   Abdominal pain;  Started  PPI  CT abdomen; negative for acute abdominal pathology.  Pressure Injury 07/19/19 Sacrum Posterior;Mid Stage 2 -  Partial thickness loss of dermis presenting as a shallow open injury with a red, pink wound bed without slough. (Active)  07/19/19 0600  Location: Sacrum  Location Orientation: Posterior;Mid  Staging: Stage 2 -  Partial thickness loss of dermis presenting as a shallow  open injury with a red, pink wound bed without slough.  Wound Description (Comments):   Present on Admission: No     Pressure Injury 07/22/19 Nare Right;Left Stage 2 -  Partial thickness loss of dermis presenting as a shallow open injury with a red, pink wound bed without slough. Injury r/t cortrak (Active)  07/22/19 1000  Location: Nare  Location Orientation: Right;Left  Staging: Stage 2 -  Partial thickness loss of dermis presenting as a shallow open injury with a red, pink wound bed without slough.  Wound Description (Comments): Injury r/t cortrak  Present on Admission: No     Nutrition Problem: Severe Malnutrition Etiology: chronic illness(pericardial effusion w/ volume overload)    Signs/Symptoms: moderate fat depletion, severe muscle depletion, percent weight loss    Interventions: Ensure Enlive (each supplement provides 350kcal and 20 grams of protein), Tube feeding  Estimated body mass index is 15.98 kg/m as calculated from the following:   Height as of this encounter: 5' 6"  (1.676 m).   Weight as of this encounter: 44.9 kg.   DVT prophylaxis: Heparin Code Status: full code Family Communication: care discussed with Husband 1-15 Disposition Plan: PT now recommending SNF, Will continue with IV solumedrol, evaluation for autoimmune diseases.  Consultants:   Cardiology  CCM  CVTS  Palliative  Nephrology  Procedures:   HD  Pericardial window  Antimicrobials:  None currently  Subjective: Patient was alert, still complaining of abdominal pain, near peg tube site. She has had joints pain, denies pain this am.  She is breathing ok. Denies worsening dyspnea.   Objective: Vitals:   08/04/19 2345 08/05/19 0406 08/05/19 0603 08/05/19 0746  BP: 128/87 121/83 127/88 129/84  Pulse: 86 84 86 83  Resp: 20 (!) 21  20  Temp: 97.6 F (36.4 C) 97.6 F (36.4 C)  97.6 F (36.4 C)  TempSrc: Oral Oral  Oral  SpO2: 98% 100%  100%  Weight:  44.9 kg    Height:         Intake/Output Summary (Last 24 hours) at 08/05/2019 0918 Last data filed at 08/05/2019 0600 Gross per 24 hour  Intake 481 ml  Output 3130 ml  Net -2649 ml   Filed Weights   08/04/19 0700 08/04/19 1036 08/05/19 0406  Weight: 47.1 kg 43.7 kg 44.9 kg    Examination:  General exam: Chronic ill appearing, alert Respiratory system: Normal Respiratory effort, minimal crackles bases.  Cardiovascular system: S 1, S 2 RRR Gastrointestinal system: BS present, soft, nt Central nervous system: Alert, follows command.  Extremities: no significant edema   Data Reviewed: I have personally reviewed following labs and imaging studies  CBC: Recent Labs  Lab 07/30/19 0500 08/02/19 0700 08/04/19 0700  WBC 4.5 9.3 4.4  HGB 9.3* 9.9* 9.2*  HCT 31.2* 32.1* 32.1*  MCV 95.4 96.7 99.1  PLT 230 285 893   Basic Metabolic Panel: Recent Labs  Lab 08/01/19 0500 08/01/19 0500 08/01/19 1455 08/02/19 0413 08/03/19 0440 08/04/19 0306 08/05/19 0539  NA 132*   < > 131* 132* 133* 131* 130*  K 6.0*   < > 5.4* 6.1* 5.3* 5.5* 4.6  CL 93*   < > 89* 92* 92* 91* 91*  CO2 24   < > 23 25 27 24 24   GLUCOSE 141*   < > 137* 124* 148* 140* 149*  BUN 87*   < > 103* 121* 98* 129* 79*  CREATININE 3.94*   < > 4.20* 4.63* 4.02* 4.74* 3.17*  CALCIUM 8.5*   < > 8.7* 8.9 8.7* 8.7* 9.1  PHOS 5.8*  --   --  7.0* 5.3* 7.2* 5.2*   < > = values in this interval not displayed.   GFR: Estimated Creatinine Clearance: 11.7 mL/min (A) (by C-G formula based on SCr of 3.17 mg/dL (H)). Liver Function Tests: Recent Labs  Lab 08/01/19 0500 08/02/19 0413 08/03/19 0440 08/04/19 0306 08/05/19 0539  ALBUMIN 2.5* 2.5* 2.5* 2.5* 3.2*   No results for input(s): LIPASE, AMYLASE in the last 168 hours. Recent Labs  Lab 08/04/19 1158  AMMONIA 29   Coagulation Profile: No results for input(s): INR, PROTIME in the last 168 hours. Cardiac Enzymes: No results for input(s): CKTOTAL, CKMB, CKMBINDEX, TROPONINI in the last  168 hours. BNP (last 3 results) No results for input(s): PROBNP in the last 8760 hours. HbA1C: No results for input(s): HGBA1C in the last 72 hours. CBG: Recent Labs  Lab 08/04/19 0411 08/04/19 1959 08/04/19 2344 08/05/19 0354 08/05/19 0755  GLUCAP 107* 123* 122* 101* 90   Lipid Profile: No results for input(s): CHOL, HDL, LDLCALC, TRIG, CHOLHDL, LDLDIRECT in the last 72 hours. Thyroid Function Tests: Recent Labs    08/02/19 1433 08/02/19 1434  TSH 5.768*  --   FREET4 0.89  --   T3FREE  --  2.3   Anemia Panel: No results for input(s): VITAMINB12, FOLATE, FERRITIN, TIBC, IRON, RETICCTPCT  in the last 72 hours. Sepsis Labs: No results for input(s): PROCALCITON, LATICACIDVEN in the last 168 hours.  Recent Results (from the past 240 hour(s))  Culture, group A strep     Status: None   Collection Time: 07/26/19 12:21 PM   Specimen: Throat  Result Value Ref Range Status   Specimen Description THROAT  Final   Special Requests NONE  Final   Culture   Final    NO GROUP A STREP (S.PYOGENES) ISOLATED Performed at [facility], [address] [place],  [address]    Report Status 07/28/2019 FINAL  Final         Radiology Studies: DG Abd Portable 1V  Result Date: 08/04/2019 CLINICAL DATA:  Abdominal pain with tube feeding. EXAM: PORTABLE ABDOMEN - 1 VIEW COMPARISON:  Radiographs dated 07/28/2019 and 07/27/2019 FINDINGS: The gastrostomy tube is in the body of the stomach, unchanged in position since the prior study of 07/28/2019. The bowel gas pattern is normal. No bone abnormality. IMPRESSION: Benign-appearing abdomen. The gastrostomy tube is in the body of the stomach. Electronically Signed   By: Lorriane Shire M.D.   On: 08/04/2019 13:16        Scheduled Meds: . amLODipine  10 mg Oral Daily  . carvedilol  3.125 mg Oral BID WC  . Chlorhexidine Gluconate Cloth  6 each Topical Q0600  . Chlorhexidine Gluconate Cloth  6 each Topical Q0600  . collagenase    Topical BID  . darbepoetin (ARANESP) injection - NON-DIALYSIS  100 mcg Subcutaneous Q Sat-1800  . diphenhydrAMINE  25 mg Oral Once  . famotidine  10 mg Oral Daily  . feeding supplement (NEPRO CARB STEADY)  1,000 mL Per Tube Q24H  . feeding supplement (PRO-STAT SUGAR FREE 64)  30 mL Per Tube Daily  . fluconazole  200 mg Oral Daily  . gabapentin  100 mg Oral BID  . Gerhardt's butt cream   Topical TID  . heparin  5,000 Units Subcutaneous Q8H  . loratadine  10 mg Oral Daily  . mouth rinse  15 mL Mouth Rinse BID  . methylPREDNISolone (SOLU-MEDROL) injection  125 mg Intravenous Q12H  . neomycin-bacitracin-polymyxin   Topical BID  . nystatin  5 mL Mouth/Throat QID  . pantoprazole (PROTONIX) IV  40 mg Intravenous Q12H  . sertraline  25 mg Oral BID  . sodium chloride flush  3 mL Intravenous Q12H   Continuous Infusions: . sodium chloride    . sodium chloride    . sodium chloride    . thiamine injection 250 mg (08/04/19 2116)     LOS: 32 days    Time spent: 35 minutes    Valetta Mulroy A Jerene Yeager, MD [facility]   If 7PM-7AM, please contact night-coverage [email or web address] Password [facility] 08/05/2019, 9:18 AM

## 2019-08-06 ENCOUNTER — Inpatient Hospital Stay (HOSPITAL_COMMUNITY): Payer: Medicare Other

## 2019-08-06 LAB — RENAL FUNCTION PANEL
Albumin: 3.1 g/dL — ABNORMAL LOW (ref 3.5–5.0)
Anion gap: 18 — ABNORMAL HIGH (ref 5–15)
BUN: 121 mg/dL — ABNORMAL HIGH (ref 8–23)
CO2: 21 mmol/L — ABNORMAL LOW (ref 22–32)
Calcium: 9.1 mg/dL (ref 8.9–10.3)
Chloride: 90 mmol/L — ABNORMAL LOW (ref 98–111)
Creatinine, Ser: 3.95 mg/dL — ABNORMAL HIGH (ref 0.44–1.00)
GFR calc Af Amer: 13 mL/min — ABNORMAL LOW (ref >60)
GFR calc non Af Amer: 11 mL/min — ABNORMAL LOW (ref >60)
Glucose, Bld: 137 mg/dL — ABNORMAL HIGH (ref 70–99)
Phosphorus: 6.3 mg/dL — ABNORMAL HIGH (ref 2.5–4.6)
Potassium: 5.3 mmol/L — ABNORMAL HIGH (ref 3.5–5.1)
Sodium: 129 mmol/L — ABNORMAL LOW (ref 135–145)

## 2019-08-06 LAB — GLUCOSE, CAPILLARY
Glucose-Capillary: 130 mg/dL — ABNORMAL HIGH (ref 70–99)
Glucose-Capillary: 152 mg/dL — ABNORMAL HIGH (ref 70–99)
Glucose-Capillary: 131 mg/dL — ABNORMAL HIGH (ref 70–99)
Glucose-Capillary: 105 mg/dL — ABNORMAL HIGH (ref 70–99)
Glucose-Capillary: 118 mg/dL — ABNORMAL HIGH (ref 70–99)
Glucose-Capillary: 149 mg/dL — ABNORMAL HIGH (ref 70–99)

## 2019-08-06 MED ORDER — METHYLPREDNISOLONE SODIUM SUCC 125 MG IJ SOLR
50.0000 mg | Freq: Every day | INTRAMUSCULAR | Status: DC
  Administered 2019-08-07: 14:00:00 50 mg via INTRAVENOUS
  Filled 2019-08-06 (×2): qty 2

## 2019-08-06 MED ORDER — METHYLPREDNISOLONE SODIUM SUCC 125 MG IJ SOLR
125.0000 mg | Freq: Once | INTRAMUSCULAR | Status: AC
  Administered 2019-08-06: 125 mg via INTRAVENOUS

## 2019-08-06 MED ORDER — METHYLPREDNISOLONE SODIUM SUCC 125 MG IJ SOLR: 50.0000 mg | Freq: Every day | INTRAMUSCULAR | Status: DC

## 2019-08-06 NOTE — Progress Notes (Signed)
[facility] Progress Note    Assessment/ Plan:   Pt is a 69 y.o. yo female  with history of HTN,  hepatic steatosis with low albumin, chronic pericardial effusion, moderate AR admitted on 12/17 for progressive dyspnea, anasarca , seen as a consultationfor AKIand fluid overload.  Now felt to have some kind of autoimmune process, started on high dose steroids   #Acute kidney injury,nonoliguric (crt normal in Sept, was 1.43 on 07/03/19) : thought to be hemodynamically mediated, possible HRS concomitant with use of IV contrast- U/A not remarkable.  Autoimmune work-up was unremarkable.   Started HD 07/18/19 -  TDC.  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  Starting CLIP process.  Based on how she has looked lately I really dont think she is a candidate for OP HD.  Tentatively has a spot in [place].  Next HD planned for Monday --  longer treatment given steroids and volume to attempt to avoid uremia  From a renal perspective I think empiric trial of steroids is fine  - don't anticipate any direct benefit or toxicity to the kidneys.   It will however make her more catabolic and potentially more uremic  #Hyperkalemia:  Has been having intermittent hyperK.  Try to avoid lokelma as it irritated throat. - now on nepro.   - steroids are not going to help this issue   #Anasarca presumably due to liver disease which was diagnosed at [facility]. She has a history of liver steatosis.No evidence of GN/nephrotic syndrome. 3rd spacing also. volume status much improved but is still an issue- UF as able   #Moderate to large pericardial effusion:  s/p pericardial window on 12/23- TCTS following, stable  #Hypertension: Blood pressure acceptable. if anything is high on low dose Coreg. We need to try and UF more as able . #Anemia: s/p transfusion on 12/22.  gave feraheme and on ESA weekly.  Hb stable in the 9's    #Hyperphosphatemia:  Improved  with dialysis.  Not her biggest issue right now-  Given her throat issues have not started binder  **Malnutrition:  S/p G tube 1/8 -  Albumin low   Dispo:    Palliative care is involved in [place] discussions. This has been a very long road for her.  Rehab will take once has OP dialysis seat.  She does not look appropriate for rehab at this time-  Palliative resuming steroids and giving fentanyl -  Considering transfer to tertiary care center.  I share concern that this is not going to have a good outcome she seems miserable    Subjective:    Pt sleeping soundly-  Because she has been so miserable of late I did not wake her up     Objective:   BP 126/82   Pulse 75   Temp (!) 97.5 F (36.4 C) (Oral)   Resp 12   Ht 5' 6"  (1.676 m)   Wt 43.5 kg   SpO2 100%   BMI 15.49 kg/m   Intake/Output Summary (Last 24 hours) at 08/06/2019 0720 Last data filed at 08/06/2019 0600 Gross per 24 hour  Intake 1329.33 ml  Output --  Net 1329.33 ml   Weight change: -0.155 kg  Physical Exam: Gen: cachectic, inc RR-  Somnolent to crying out  CVS: RRR, well-healing scars Resp: normal WOB Abd: thin, mildly painful to palpation Ext: + flank edema ACCESS: L TDC c/d/i  Imaging: CT ABDOMEN PELVIS WO CONTRAST  Result  Date: 08/05/2019 CLINICAL DATA:  Acute upper abdominal pain. EXAM: CT ABDOMEN AND PELVIS WITHOUT CONTRAST TECHNIQUE: Multidetector CT imaging of the abdomen and pelvis was performed following the standard protocol without IV contrast. COMPARISON:  None. FINDINGS: Lower chest: Bilateral pleural effusions are noted with adjacent subsegmental atelectasis, right greater than left. Hepatobiliary: No focal liver abnormality is seen. No gallstones, gallbladder wall thickening, or biliary dilatation. Pancreas: Unremarkable. No pancreatic ductal dilatation or surrounding inflammatory changes. Spleen: Normal in size without focal abnormality. Adrenals/Urinary Tract: Adrenal glands are unremarkable. Kidneys  are normal, without renal calculi, focal lesion, or hydronephrosis. Bladder is unremarkable. Stomach/Bowel: Gastrostomy tube is well positioned within gastric lumen. Status post appendectomy. There is no evidence of bowel obstruction or inflammation. Vascular/Lymphatic: No significant vascular findings are present. No enlarged abdominal or pelvic lymph nodes. Reproductive: Uterus and bilateral adnexa are unremarkable. Other: No hernia is noted. Small amount of free fluid is noted in the posterior portion of the pelvis. Musculoskeletal: No acute or significant osseous findings. IMPRESSION: 1. Bilateral pleural effusions are noted with adjacent subsegmental atelectasis, right greater than left. 2. Gastrostomy tube is well positioned within gastric lumen. 3. Small amount of free fluid is noted in the posterior portion of the pelvis. 4. No other significant abnormality seen in the abdomen or pelvis. Electronically Signed   By: Marijo Conception M.D.   On: 08/05/2019 12:16   DG Abd Portable 1V  Result Date: 08/04/2019 CLINICAL DATA:  Abdominal pain with tube feeding. EXAM: PORTABLE ABDOMEN - 1 VIEW COMPARISON:  Radiographs dated 07/28/2019 and 07/27/2019 FINDINGS: The gastrostomy tube is in the body of the stomach, unchanged in position since the prior study of 07/28/2019. The bowel gas pattern is normal. No bone abnormality. IMPRESSION: Benign-appearing abdomen. The gastrostomy tube is in the body of the stomach. Electronically Signed   By: Lorriane Shire M.D.   On: 08/04/2019 13:16    Labs: BMET Recent Labs  Lab 07/31/19 0509 07/31/19 0509 08/01/19 0500 08/01/19 1455 08/02/19 0413 08/03/19 0440 08/04/19 0306 08/05/19 0539 08/06/19 0253  NA 130*   < > 132* 131* 132* 133* 131* 130* 129*  K 6.2*   < > 6.0* 5.4* 6.1* 5.3* 5.5* 4.6 5.3*  CL 91*   < > 93* 89* 92* 92* 91* 91* 90*  CO2 24   < > 24 23 25 27 24 24  21*  GLUCOSE 153*   < > 141* 137* 124* 148* 140* 149* 137*  BUN 103*   < > 87* 103* 121* 98*  129* 79* 121*  CREATININE 4.83*   < > 3.94* 4.20* 4.63* 4.02* 4.74* 3.17* 3.95*  CALCIUM 9.0   < > 8.5* 8.7* 8.9 8.7* 8.7* 9.1 9.1  PHOS 7.5*  --  5.8*  --  7.0* 5.3* 7.2* 5.2* 6.3*   < > = values in this interval not displayed.   CBC Recent Labs  Lab 08/02/19 0700 08/04/19 0700  WBC 9.3 4.4  HGB 9.9* 9.2*  HCT 32.1* 32.1*  MCV 96.7 99.1  PLT 285 274    Medications:    . amLODipine  10 mg Oral Daily  . carvedilol  3.125 mg Oral BID WC  . Chlorhexidine Gluconate Cloth  6 each Topical Q0600  . Chlorhexidine Gluconate Cloth  6 each Topical Q0600  . Chlorhexidine Gluconate Cloth  6 each Topical Q0600  . collagenase   Topical BID  . darbepoetin (ARANESP) injection - NON-DIALYSIS  100 mcg Subcutaneous Q Sat-1800  . diphenhydrAMINE  25  mg Oral Once  . famotidine  10 mg Oral Daily  . feeding supplement (NEPRO CARB STEADY)  1,000 mL Per Tube Q24H  . feeding supplement (PRO-STAT SUGAR FREE 64)  30 mL Per Tube Daily  . fluconazole  200 mg Oral Daily  . gabapentin  100 mg Oral BID  . Gerhardt's butt cream   Topical TID  . heparin  5,000 Units Subcutaneous Q8H  . loratadine  10 mg Oral Daily  . mouth rinse  15 mL Mouth Rinse BID  . methylPREDNISolone (SOLU-MEDROL) injection  125 mg Intravenous Daily  . neomycin-bacitracin-polymyxin   Topical BID  . nystatin  5 mL Mouth/Throat QID  . pantoprazole (PROTONIX) IV  40 mg Intravenous Q12H  . sertraline  25 mg Oral BID  . sodium chloride flush  3 mL Intravenous Q12H      [facility]  08/06/2019, 7:20 AM

## 2019-08-06 NOTE — Progress Notes (Signed)
PROGRESS NOTE    Joanna Ali  DEY:[id number] DOB: August 15, 1950 DOA: 07/03/2019 PCP: Renaldo Reel, DO   Brief Narrative: 69 year old with past medical history significant for hypertension, chronic pericardial effusion, moderate aortic valve regurgitation, former smoker, alcohol use.  Initially admitted on 12/17 for progressive shortness of breath with anasarca and fatigue.  Previously worked up by [facility] rheumatology with negative work-up including ESR, lupus anticoagulant, rheumatoid factor, TSH, ANCA, ANA and light free chain studies.  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  Since that time patient had stopped drinking alcohol as advised.  She had been on diuretics and spironolactone without any improvement in symptoms.  CT angiogram of the chest on 12/15 noted moderate pericardial effusion and airspace disease.  She underwent thoracentesis by PCCM which removed 700 cc of fluid.  Patient had undergone right heart cath on 12/17 and underwent pericardial window on 12/23.  Attempts were made with IV diuretics but patient developed worsening kidney function with creatinine up to 5.  Nephrology was formally consulted and patient was a started on dialysis.  Patient has been having poor oral intake and she had a core track place. Since last medicine consult note patient has a PEG for persistent poor oral intake and throat pain, on hemodialysis for AKI with rapid progression to end-stage renal disease, severe anasarca and pericardial effusion status post window.  She has been recently started on empiric steroids.   Assessment & Plan:   Principal Problem:   Anasarca Active Problems:   Pericardial effusion   Hypertension   Elevated troponin   Atypical pneumonia   Hypoxia   Protein-calorie malnutrition, severe   Pressure injury of skin   Failure to  thrive in adult   Goals of care, counseling/discussion   Dysphagia   Muscular weakness   Thrush of mouth and esophagus (HCC)   ESRD on hemodialysis (HCC)   Dyspnea   S/P thoracentesis  1-Anasarca; FTT, Odynophagia, Joint pains.  -Thought to be related to Chronic ETOH use and Hepatic Steatosis. Autoimmune process.  -Continue with HD, and nutrition support.  -Started on IV steroid to treat empirically in case Rheumatology etiology.  -Patient had CT abdomen plevis, CT chest back in September negative for lymphadenopathy or mass.  -Rheumatology evaluation September 2020.: CRP 13, ESR 25, ANA negative, CANCA,, PANCA negative, Sclerodermas cl 70 negative, SSS (ro) negative, electrophoresis protein; M spike negative. HIV negative,  -Urine and blood Histamine level. Histamine 0.283 NL. -Started on Solumedrol by palliative.  - Double strand DNA Negative. ANA negative, RF 71.  -TSH mildly elevated, Free T 3 and T 4 normal.  -Contacted [facility] again  08-05-2019 for transfer, and update on RF results, they decline transfer, no bed available.  -Thiamine level Pending, Anticetromere antibody 0.2 negative, Anti RNA Polymerase antibody pending, Heavy metal blood pending.  -Received a dose of 125 mg IV solumedrol night of 1-14. -RF at 71, increased  IV solumedrol to 125 mg day 3, then 50 mg daily.  -Discussed with Rheumatology from [facility]; recommendations; check CCP level, Viral Hepatitis panel negative. If CCP is positive more likely RA. -  2-ESRD new on HD;  Progressive AKI, found to be exacerbated by contrast-induced nephropathy. Started on hemodialysis, with improvement of anasarca. Out patient HD has been set up.   3-Acute AMS, Lethargic, Encephalopathy; FTT Patient became lethargic in HD, 1-13 Likely related  to medication ativan. She got a dose during HD>  Will limit sedatives. Stop fentanyl, decrease opioids. Stop ativan.  ABG PCO2 not elevated. CT head Negative for acute  abnormalities.  Ammonia 29, Thiamine level pending.  IV thiamine 250 mg TID for 3 days.   4-Acute Hypoxic Respiratory Failure; Pulmonary edema, pleural effusion; Complaints of worsening dyspnea.  Chest x ray worsening left pleural effusion/. Pulmonary edema, infiltrates. CCM consulted.  She was started on IV solumedrol by palliative for presume autoimmune process ?? Patient has been more anxious since them. I will decreased dose solumedrol.  Worsening Dyspnea, chest x ray 1-12; worsening pleural effusion. Underwent repeated Thoracentesis 1-12; 1.1 L Left  and 500 cc from right.  Oxygen sat 99 2 L.  Plan to repeat Chest x ray tomorrow.   3-Pericardial effusion with elevated troponin;  -Status post pericardial window on 12/23, under care of cardiology and CT surgery.   -She had a pericardial effusion as well as pleural effusion that are thought to be stable at this point in time.   4-Hyperkalemia;  Had HD 1-15 Will give lokelma  5-Failure to Thrive, Severe malnutrition;  -Continue with tube feeding. -speech eval.   6-Atypical PNA with Hypoxia; Completed treatment.   7-Dysphagia Thrush;  On Diflucan.  On tube feeding.  Pepcid.  Improved.   8-Stage 2 pressure Ulcer;  Continue with local care.   9-Anxiety;  Continue with Zoloft.  Avoid sedatives, became very lethargic during HD 1-13 after 1 mg Ativan,.  10-Back pain;  Continue with fentanyl PRN   11-HTN;  Continue with coreg. Will add norvasc.   Hypernatremia; corrected with HD.   Abdominal pain;  Started  PPI  CT abdomen; negative for acute abdominal pathology. report improvement of pain.    Pressure Injury 07/19/19 Sacrum Posterior;Mid Stage 2 -  Partial thickness loss of dermis presenting as a shallow open injury with a red, pink wound bed without slough. (Active)  07/19/19 0600  Location: Sacrum  Location Orientation: Posterior;Mid  Staging: Stage 2 -  Partial thickness loss of dermis presenting as a shallow  open injury with a red, pink wound bed without slough.  Wound Description (Comments):   Present on Admission: No     Pressure Injury 07/22/19 Nare Right;Left Stage 2 -  Partial thickness loss of dermis presenting as a shallow open injury with a red, pink wound bed without slough. Injury r/t cortrak (Active)  07/22/19 1000  Location: Nare  Location Orientation: Right;Left  Staging: Stage 2 -  Partial thickness loss of dermis presenting as a shallow open injury with a red, pink wound bed without slough.  Wound Description (Comments): Injury r/t cortrak  Present on Admission: No     Nutrition Problem: Severe Malnutrition Etiology: chronic illness(pericardial effusion w/ volume overload)    Signs/Symptoms: moderate fat depletion, severe muscle depletion, percent weight loss    Interventions: Ensure Enlive (each supplement provides 350kcal and 20 grams of protein), Tube feeding  Estimated body mass index is 15.49 kg/m as calculated from the following:   Height as of this encounter: 5' 6"  (1.676 m).   Weight as of this encounter: 43.5 kg.   DVT prophylaxis: Heparin Code Status: full code Family Communication: care discussed with Husband 1-16 Disposition Plan: Will ask PT evaluation, patient appears improved.   Consultants:   Cardiology  CCM  CVTS  Palliative  Nephrology  Procedures:   HD  Pericardial window  Antimicrobials:  None currently   Subjective: Patient alert, feels better, appears  more comfortable. Denies dyspnea.  Report improvement of abdominal pain.  Per nurse, patient was able to sleep last night.    Objective: Vitals:   08/05/19 2245 08/06/19 0315 08/06/19 0623 08/06/19 0800  BP: (!) 146/98 115/79 126/82 120/86  Pulse: 72 71 75 71  Resp: (!) 21 12  16   Temp: (!) 97.5 F (36.4 C) (!) 97.5 F (36.4 C)  (!) 97.5 F (36.4 C)  TempSrc: Oral Oral  Oral  SpO2: 100% 100%  100%  Weight:  43.5 kg    Height:        Intake/Output Summary  (Last 24 hours) at 08/06/2019 0918 Last data filed at 08/06/2019 0600 Gross per 24 hour  Intake 1329.33 ml  Output --  Net 1329.33 ml   Filed Weights   08/04/19 1036 08/05/19 0406 08/06/19 0315  Weight: 43.7 kg 44.9 kg 43.5 kg    Examination:  General exam: Chronic ill, appears with better spirit, more alert.  Respiratory system: Normal respiratory effort, minimal crackles.  Cardiovascular system: S 1, S 2 RRR Gastrointestinal system: BS present, soft, nt Central nervous system: alert, following command, raise Both upper extremities. .  Extremities: trace edema   Data Reviewed: I have personally reviewed following labs and imaging studies  CBC: Recent Labs  Lab 08/02/19 0700 08/04/19 0700  WBC 9.3 4.4  HGB 9.9* 9.2*  HCT 32.1* 32.1*  MCV 96.7 99.1  PLT 285 270   Basic Metabolic Panel: Recent Labs  Lab 08/02/19 0413 08/03/19 0440 08/04/19 0306 08/05/19 0539 08/06/19 0253  NA 132* 133* 131* 130* 129*  K 6.1* 5.3* 5.5* 4.6 5.3*  CL 92* 92* 91* 91* 90*  CO2 25 27 24 24  21*  GLUCOSE 124* 148* 140* 149* 137*  BUN 121* 98* 129* 79* 121*  CREATININE 4.63* 4.02* 4.74* 3.17* 3.95*  CALCIUM 8.9 8.7* 8.7* 9.1 9.1  PHOS 7.0* 5.3* 7.2* 5.2* 6.3*   GFR: Estimated Creatinine Clearance: 9.1 mL/min (A) (by C-G formula based on SCr of 3.95 mg/dL (H)). Liver Function Tests: Recent Labs  Lab 08/02/19 0413 08/03/19 0440 08/04/19 0306 08/05/19 0539 08/06/19 0253  ALBUMIN 2.5* 2.5* 2.5* 3.2* 3.1*   No results for input(s): LIPASE, AMYLASE in the last 168 hours. Recent Labs  Lab 08/04/19 1158  AMMONIA 29   Coagulation Profile: No results for input(s): INR, PROTIME in the last 168 hours. Cardiac Enzymes: No results for input(s): CKTOTAL, CKMB, CKMBINDEX, TROPONINI in the last 168 hours. BNP (last 3 results) No results for input(s): PROBNP in the last 8760 hours. HbA1C: No results for input(s): HGBA1C in the last 72 hours. CBG: Recent Labs  Lab 08/05/19 1605  08/05/19 2004 08/05/19 2304 08/06/19 0317 08/06/19 0740  GLUCAP 113* 95 107* 130* 152*   Lipid Profile: No results for input(s): CHOL, HDL, LDLCALC, TRIG, CHOLHDL, LDLDIRECT in the last 72 hours. Thyroid Function Tests: No results for input(s): TSH, T4TOTAL, FREET4, T3FREE, THYROIDAB in the last 72 hours. Anemia Panel: No results for input(s): VITAMINB12, FOLATE, FERRITIN, TIBC, IRON, RETICCTPCT in the last 72 hours. Sepsis Labs: No results for input(s): PROCALCITON, LATICACIDVEN in the last 168 hours.  No results found for this or any previous visit (from the past 240 hour(s)).       Radiology Studies: CT ABDOMEN PELVIS WO CONTRAST  Result Date: 08/05/2019 CLINICAL DATA:  Acute upper abdominal pain. EXAM: CT ABDOMEN AND PELVIS WITHOUT CONTRAST TECHNIQUE: Multidetector CT imaging of the abdomen and pelvis was performed following the standard protocol without IV  contrast. COMPARISON:  None. FINDINGS: Lower chest: Bilateral pleural effusions are noted with adjacent subsegmental atelectasis, right greater than left. Hepatobiliary: No focal liver abnormality is seen. No gallstones, gallbladder wall thickening, or biliary dilatation. Pancreas: Unremarkable. No pancreatic ductal dilatation or surrounding inflammatory changes. Spleen: Normal in size without focal abnormality. Adrenals/Urinary Tract: Adrenal glands are unremarkable. Kidneys are normal, without renal calculi, focal lesion, or hydronephrosis. Bladder is unremarkable. Stomach/Bowel: Gastrostomy tube is well positioned within gastric lumen. Status post appendectomy. There is no evidence of bowel obstruction or inflammation. Vascular/Lymphatic: No significant vascular findings are present. No enlarged abdominal or pelvic lymph nodes. Reproductive: Uterus and bilateral adnexa are unremarkable. Other: No hernia is noted. Small amount of free fluid is noted in the posterior portion of the pelvis. Musculoskeletal: No acute or significant  osseous findings. IMPRESSION: 1. Bilateral pleural effusions are noted with adjacent subsegmental atelectasis, right greater than left. 2. Gastrostomy tube is well positioned within gastric lumen. 3. Small amount of free fluid is noted in the posterior portion of the pelvis. 4. No other significant abnormality seen in the abdomen or pelvis. Electronically Signed   By: Marijo Conception M.D.   On: 08/05/2019 12:16   DG Abd Portable 1V  Result Date: 08/04/2019 CLINICAL DATA:  Abdominal pain with tube feeding. EXAM: PORTABLE ABDOMEN - 1 VIEW COMPARISON:  Radiographs dated 07/28/2019 and 07/27/2019 FINDINGS: The gastrostomy tube is in the body of the stomach, unchanged in position since the prior study of 07/28/2019. The bowel gas pattern is normal. No bone abnormality. IMPRESSION: Benign-appearing abdomen. The gastrostomy tube is in the body of the stomach. Electronically Signed   By: Lorriane Shire M.D.   On: 08/04/2019 13:16        Scheduled Meds: . amLODipine  10 mg Oral Daily  . carvedilol  3.125 mg Oral BID WC  . Chlorhexidine Gluconate Cloth  6 each Topical Q0600  . collagenase   Topical BID  . darbepoetin (ARANESP) injection - NON-DIALYSIS  100 mcg Subcutaneous Q Sat-1800  . diphenhydrAMINE  25 mg Oral Once  . famotidine  10 mg Oral Daily  . feeding supplement (NEPRO CARB STEADY)  1,000 mL Per Tube Q24H  . feeding supplement (PRO-STAT SUGAR FREE 64)  30 mL Per Tube Daily  . fluconazole  200 mg Oral Daily  . gabapentin  100 mg Oral BID  . Gerhardt's butt cream   Topical TID  . heparin  5,000 Units Subcutaneous Q8H  . loratadine  10 mg Oral Daily  . mouth rinse  15 mL Mouth Rinse BID  . methylPREDNISolone (SOLU-MEDROL) injection  125 mg Intravenous Once  . [START ON 08/07/2019] methylPREDNISolone (SOLU-MEDROL) injection  50 mg Intravenous Daily  . neomycin-bacitracin-polymyxin   Topical BID  . nystatin  5 mL Mouth/Throat QID  . pantoprazole (PROTONIX) IV  40 mg Intravenous Q12H  .  sertraline  25 mg Oral BID  . sodium chloride flush  3 mL Intravenous Q12H   Continuous Infusions: . sodium chloride    . sodium chloride    . sodium chloride    . thiamine injection 250 mg (08/05/19 2112)     LOS: 33 days    Time spent: 35 minutes    Michaele Amundson A Maliya Marich, MD [facility]   If 7PM-7AM, please contact night-coverage [email or web address] Password [facility] 08/06/2019, 9:18 AM

## 2019-08-06 NOTE — Evaluation (Signed)
Clinical/Bedside Swallow Evaluation Patient Details  Name: Joanna Ali MRN: [id number] Date of Birth: 10-30-1950  Today's Date: 08/06/2019 Time: SLP Start Time (ACUTE ONLY): 1225 SLP Stop Time (ACUTE ONLY): 1252 SLP Time Calculation (min) (ACUTE ONLY): 27 min  Past Medical History:  Past Medical History:  Diagnosis Date  . Hypertension   . Leg swelling 03/2019  . Short of breath on exertion 03/2019   Past Surgical History:  Past Surgical History:  Procedure Laterality Date  . APPENDECTOMY    . IR FLUORO GUIDE CV LINE LEFT  07/18/2019  . IR GASTROSTOMY TUBE MOD SED  07/28/2019  . IR US GUIDE VASC ACCESS LEFT  07/18/2019  . NECK SURGERY    . RIGHT AND LEFT HEART CATH N/A 07/06/2019   Procedure: RIGHT AND LEFT HEART CATH;  Surgeon: Jolaine Artist, MD;  Location: [place] [facility];  Service: Cardiovascular;  Laterality: N/A;  . TEE WITHOUT CARDIOVERSION N/A 03/28/2019   Procedure: TRANSESOPHAGEAL ECHOCARDIOGRAM (TEE);  Surgeon: Geralynn Rile, MD;  Location: [place];  Service: Cardiology;  Laterality: N/A;  . VIDEO ASSISTED THORACOSCOPY Right 07/12/2019   Procedure: VIDEO ASSISTED THORACOSCOPY Pericardial window ;  Surgeon: Lajuana Matte, MD;  Location: [facility];  Service: Thoracic;  Laterality: Right;   HPI:  69 y.o. female  with past medical history of hypertension, moderate aortic regurgitation, chronic pericardial effusion, osteoarthritis, anasarca (secondary to liver disease?), macular degeneration admitted on 07/03/2019 with shortness of breath and tachycardia. Hospitalization complicated by acute kidney injury requiring dialysis. 12/15 bilateral thoracentesis and 12/23 pericardial window. PEG placed 1/8 due to failure to thrive.    Assessment / Plan / Recommendation Clinical Impression  Clinical bedside swallow evaluation completed. Patient was sleeping when entering room. She woke quickly and agreeable to eating lunch. Lunch tray of regular consistencies and  thin liquids. RN reported pt now has PEG due to failure to thrive. Pt has been receiving thrush treatment regularly. She eats very little from her trays, however she does eat some of the snacksher husband brings. Pt refused to eat or drink anything until she first had ice chips to wet her throat and give her some relief from pain. With every swallow reguardless of consistency, she grimaces with pain. She exhibited prolonged but complete chewing of chicken, however she expressed it feels thick and eventually spit it out. All other trials (macaroni with cheese, pudding, steamed cabbage, ice chips, ginger ale) were tolerated well with no s/s of aspiration. Her swallowing function appears to be normal, however limited by the pain she is experiencing in her throat (Thrush?). When the thrush is felt to be successfully treated and she is still experiencing pain, an ENT consult would be recommended to visually assess patient's throat for cause of ongoing pain. Recommend continuing regular diet to not limit patient's choices with patient choosing softer foods. No further ST needs at this time.  SLP Visit Diagnosis: Dysphagia, unspecified (R13.10)    Aspiration Risk  No limitations    Diet Recommendation Regular;Thin liquid   Liquid Administration via: Cup;Straw Medication Administration: Crushed with puree Supervision: Patient able to self feed Compensations: Minimize environmental distractions;Slow rate;Small sips/bites Postural Changes: Seated upright at 90 degrees    Other  Recommendations Oral Care Recommendations: Oral care BID   Follow up Recommendations                    Prognosis Prognosis for Safe Diet Advancement: Good      Swallow Study  General Date of Onset: 07/03/19 HPI: 69 y.o. female  with past medical history of hypertension, moderate aortic regurgitation, chronic pericardial effusion, osteoarthritis, anasarca (secondary to liver disease?), macular degeneration admitted on  07/03/2019 with shortness of breath and tachycardia. Hospitalization complicated by acute kidney injury requiring dialysis. 12/15 bilateral thoracentesis and 12/23 pericardial window. PEG placed 1/8 due to failure to thrive.  Type of Study: Bedside Swallow Evaluation Previous Swallow Assessment: 07/30/18 Diet Prior to this Study: Regular;Thin liquids Temperature Spikes Noted: No Respiratory Status: Room air History of Recent Intubation: No Behavior/Cognition: Alert;Cooperative Oral Cavity Assessment: Within Functional Limits Oral Care Completed by SLP: No Oral Cavity - Dentition: Adequate natural dentition Vision: Functional for self-feeding Self-Feeding Abilities: Able to feed self Patient Positioning: Upright in bed Baseline Vocal Quality: Low vocal intensity;Hoarse Volitional Cough: Weak Volitional Swallow: Able to elicit    Oral/Motor/Sensory Function Overall Oral Motor/Sensory Function: Generalized oral weakness Facial ROM: Within Functional Limits   Ice Chips Ice chips: Within functional limits Presentation: Spoon Other Comments: Pain with swallow   Thin Liquid Thin Liquid: Within functional limits Other Comments: pain with swallow    Nectar Thick Nectar Thick Liquid: Not tested   Honey Thick Honey Thick Liquid: Not tested   Puree Puree: Within functional limits Other Comments: pain with swallowing   Solid     Solid: Impaired Presentation: Spoon Oral Phase Functional Implications: Oral holding;Impaired mastication(with meats only, spit out instead of swallow, fully chewed)     Wynelle Bourgeois., MA CCC-SLP 08/06/2019,1:06 PM

## 2019-08-07 LAB — CBC
HCT: 34.6 % — ABNORMAL LOW (ref 36.0–46.0)
Hemoglobin: 9.9 g/dL — ABNORMAL LOW (ref 12.0–15.0)
MCH: 28.1 pg (ref 26.0–34.0)
MCHC: 28.6 g/dL — ABNORMAL LOW (ref 30.0–36.0)
MCV: 98.3 fL (ref 80.0–100.0)
Platelets: 372 10*3/uL (ref 150–400)
RBC: 3.52 MIL/uL — ABNORMAL LOW (ref 3.87–5.11)
RDW: 21.3 % — ABNORMAL HIGH (ref 11.5–15.5)
WBC: 2.8 10*3/uL — ABNORMAL LOW (ref 4.0–10.5)
nRBC: 2.5 % — ABNORMAL HIGH (ref 0.0–0.2)

## 2019-08-07 LAB — RENAL FUNCTION PANEL
Albumin: 3 g/dL — ABNORMAL LOW (ref 3.5–5.0)
Anion gap: 20 — ABNORMAL HIGH (ref 5–15)
BUN: 165 mg/dL — ABNORMAL HIGH (ref 8–23)
CO2: 21 mmol/L — ABNORMAL LOW (ref 22–32)
Calcium: 9.6 mg/dL (ref 8.9–10.3)
Chloride: 88 mmol/L — ABNORMAL LOW (ref 98–111)
Creatinine, Ser: 5.05 mg/dL — ABNORMAL HIGH (ref 0.44–1.00)
GFR calc Af Amer: 9 mL/min — ABNORMAL LOW (ref >60)
GFR calc non Af Amer: 8 mL/min — ABNORMAL LOW (ref >60)
Glucose, Bld: 135 mg/dL — ABNORMAL HIGH (ref 70–99)
Phosphorus: 6.3 mg/dL — ABNORMAL HIGH (ref 2.5–4.6)
Potassium: 6.1 mmol/L — ABNORMAL HIGH (ref 3.5–5.1)
Sodium: 129 mmol/L — ABNORMAL LOW (ref 135–145)

## 2019-08-07 LAB — SCLERODERMA DIAGNOSTIC PROFILE
Anti Nuclear Antibody (ANA): NEGATIVE
Scleroderma (Scl-70) (ENA) Antibody, IgG: <0.2 AI (ref 0.0–0.9)

## 2019-08-07 LAB — GLUCOSE, RANDOM: Glucose, Bld: 122 mg/dL — ABNORMAL HIGH (ref 70–99)

## 2019-08-07 LAB — GLUCOSE, CAPILLARY
Glucose-Capillary: 138 mg/dL — ABNORMAL HIGH (ref 70–99)
Glucose-Capillary: 87 mg/dL (ref 70–99)
Glucose-Capillary: 58 mg/dL — ABNORMAL LOW (ref 70–99)
Glucose-Capillary: 66 mg/dL — ABNORMAL LOW (ref 70–99)
Glucose-Capillary: 87 mg/dL (ref 70–99)
Glucose-Capillary: 84 mg/dL (ref 70–99)
Glucose-Capillary: 111 mg/dL — ABNORMAL HIGH (ref 70–99)

## 2019-08-07 MED ORDER — DEXTROSE 50 % IV SOLN
INTRAVENOUS | Status: AC
  Administered 2019-08-07: 14:00:00 25 mL
  Filled 2019-08-07: qty 50

## 2019-08-07 MED ORDER — FENTANYL CITRATE (PF) 100 MCG/2ML IJ SOLN
INTRAMUSCULAR | Status: AC
  Administered 2019-08-07: 12.5 ug via INTRAVENOUS
  Filled 2019-08-07: qty 2

## 2019-08-07 MED ORDER — HEPARIN SODIUM (PORCINE) 1000 UNIT/ML IJ SOLN
INTRAMUSCULAR | Status: AC
  Administered 2019-08-07: 3800 [IU] via INTRAVENOUS_CENTRAL
  Filled 2019-08-07: qty 4

## 2019-08-07 MED ORDER — GLUCOSE 40 % PO GEL
1.0000 | Freq: Once | ORAL | Status: DC
  Filled 2019-08-07: qty 1

## 2019-08-07 MED ORDER — BIOTENE DRY MOUTH MT LIQD: 15.0000 mL | OROMUCOSAL | Status: DC | PRN

## 2019-08-07 MED ORDER — SODIUM BICARBONATE/SODIUM CHLORIDE MOUTHWASH
Freq: Four times a day (QID) | OROMUCOSAL | Status: DC
  Filled 2019-08-07: qty 1000

## 2019-08-07 MED ORDER — ALBUMIN HUMAN 25 % IV SOLN
INTRAVENOUS | Status: AC
  Administered 2019-08-07: 25 g
  Filled 2019-08-07: qty 100

## 2019-08-07 MED ORDER — SODIUM CHLORIDE 0.9 % IR SOLN
15.0000 mL | Freq: Four times a day (QID) | Status: DC
  Administered 2019-08-07 – 2019-08-23 (×22): 15 mL

## 2019-08-07 NOTE — Plan of Care (Signed)

## 2019-08-07 NOTE — Progress Notes (Signed)
PT Cancellation Note  Patient Details Name: Tamilyn Lupien MRN: [id number] DOB: [birth year]   Cancelled Treatment:    Reason Eval/Treat Not Completed: Patient at procedure or test/unavailable Pt at HD. Will follow up as time allows.   Marguarite Arbour A Kensey Luepke 08/07/2019, 8:47 AM Marisa Severin, PT, DPT Acute Rehabilitation Services Pager [phone] Office [phone]

## 2019-08-07 NOTE — Progress Notes (Signed)
PROGRESS NOTE    Joanna Ali  NWG:[id number] DOB: [birth year] DOA: 07/03/2019 PCP: Renaldo Reel, DO   Brief Narrative: 69 year old with past medical history significant for hypertension, chronic pericardial effusion, moderate aortic valve regurgitation, former smoker, alcohol use.  Initially admitted on 12/17 for progressive shortness of breath with anasarca and fatigue.  Previously worked up by [facility] rheumatology with negative work-up including ESR, lupus anticoagulant, rheumatoid factor, TSH, ANCA, ANA and light free chain studies.  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  Since that time patient had stopped drinking alcohol as advised.  She had been on diuretics and spironolactone without any improvement in symptoms.  CT angiogram of the chest on 12/15 noted moderate pericardial effusion and airspace disease.  She underwent thoracentesis by PCCM which removed 700 cc of fluid.  Patient had undergone right heart cath on 12/17 and underwent pericardial window on 12/23.  Attempts were made with IV diuretics but patient developed worsening kidney function with creatinine up to 5.  Nephrology was formally consulted and patient was a started on dialysis.  Patient has been having poor oral intake and she had a core track place. Since last medicine consult note patient has a PEG for persistent poor oral intake and throat pain, on hemodialysis for AKI with rapid progression to end-stage renal disease, severe anasarca and pericardial effusion status post window.  She has been recently started on empiric steroids.   Assessment & Plan:   Principal Problem:   Anasarca Active Problems:   Pericardial effusion   Hypertension   Elevated troponin   Atypical pneumonia   Hypoxia   Protein-calorie malnutrition, severe   Pressure injury of skin   Failure to  thrive in adult   Goals of care, counseling/discussion   Dysphagia   Muscular weakness   Thrush of mouth and esophagus (HCC)   ESRD on hemodialysis (HCC)   Dyspnea   S/P thoracentesis  1-Anasarca; FTT, Odynophagia, Joint pains.  -Thought to be related to Chronic ETOH use and Hepatic Steatosis. Autoimmune process.  -Continue with HD, and nutrition support.  -Started on IV steroid to treat empirically in case Rheumatology etiology.  -Patient had CT abdomen plevis, CT chest back in September negative for lymphadenopathy or mass.  -Rheumatology evaluation September 2020.: CRP 13, ESR 25, ANA negative, CANCA,, PANCA negative, Sclerodermas cl 70 negative, SSS (ro) negative, electrophoresis protein; M spike negative. HIV negative,  -Urine and blood Histamine level. Histamine 0.283 NL. -Started on Solumedrol by palliative.  - Double strand DNA Negative. ANA negative, RF 71.  -TSH mildly elevated, Free T 3 and T 4 normal.  -Contacted [facility] again  08-05-2019 for transfer, and update on RF results, they decline transfer, no bed available.  -Thiamine level Pending, Anticetromere antibody 0.2 negative, Anti RNA Polymerase antibody pending, Heavy metal blood pending.  -Received a dose of 125 mg IV solumedrol night of 1-14. -RF at 71, increased  IV solumedrol to 125 mg day 3, then 50 mg daily.  -Discussed with Rheumatology from [facility]; recommendations; check CCP level, Viral Hepatitis panel negative. If CCP is positive more likely RA. -Discussed with Rheumatology Dr Lenna Gilford, [facility], if patient has RA that wouldn't explain her renal failure, could explain pericarditis /pericardial effusion. She is recommending patient to be transfer to [facility] center.  -I called  [facility] 08/07/2019 no bed available.  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  Repeated SCL 70 negative.  anticentromere antibody negative.  -She had abnormal level less than 0.1 M spike on lab work done at Dr [employer] office. I will repeat SPEP and UPEP. Of note SPEP on September was negative,.   2-ESRD new on HD;  Progressive AKI, found to be exacerbated by contrast-induced nephropathy. Started on hemodialysis, with improvement of anasarca. Out patient HD has been set up.   3-Acute AMS, Lethargic, Encephalopathy; FTT Patient became lethargic in HD, 1-13 Likely related to medication ativan. She got a dose during HD>  Will limit sedatives. Stop fentanyl, decrease opioids. Stop ativan.  ABG PCO2 not elevated. CT head Negative for acute abnormalities.  Ammonia 29, Thiamine level pending.  IV thiamine 250 mg TID for 3 days.  Patient is more alert, and following command.   4-Acute Hypoxic Respiratory Failure; Pulmonary edema, pleural effusion; -She was started on IV solumedrol by palliative for presume autoimmune process ?. -Worsening Dyspnea, chest x ray 1-12; worsening pleural effusion. Underwent repeated Thoracentesis 1-12; 1.1 L Left  and 500 cc from right.  -Chest x ray with Bilateral pleural effusion. Patient is on RA. Will need to repeat chest x ray in few days.  -Pleural cytology reactive mesothelia cell. Culture no growth.    3-Pericardial effusion with elevated troponin;  -Status post pericardial window on 12/23, under care of cardiology and CT surgery.   -She had a pericardial effusion as well as pleural effusion that are thought to be stable at this point in time. -pathology no malignancy, chronic inflammation. AFB negative.  -Fungus culture pending.   4-Hyperkalemia;  Had HD today.  -  5-Failure to Thrive, Severe malnutrition;  -Continue with tube feeding. -speech eval.   6-Atypical PNA with Hypoxia; Completed treatment.   7-Dysphagia Thrush;  On Diflucan.  On tube feeding.  Pepcid.  Improved.   8-Stage 2 pressure Ulcer;  Continue with  local care.   9-Anxiety;  Continue with Zoloft.  Avoid sedatives, became very lethargic during HD 1-13 after 1 mg Ativan,. She has been less anxious.   10-Back pain;  Continue with fentanyl PRN   11-HTN;  Continue with coreg,  norvasc.   Hypernatremia; corrected with HD.   Abdominal pain;  Started  PPI  CT abdomen; negative for acute abdominal pathology. report improvement of pain.   Hypoglycemia; suspect is not accurate due to poor perfusion of fingers. Check venous sample.    Pressure Injury 07/19/19 Sacrum Posterior;Mid Stage 2 -  Partial thickness loss of dermis presenting as a shallow open injury with a red, pink wound bed without slough. (Active)  07/19/19 0600  Location: Sacrum  Location Orientation: Posterior;Mid  Staging: Stage 2 -  Partial thickness loss of dermis presenting as a shallow open injury with a red, pink wound bed without slough.  Wound Description (Comments):   Present on Admission: No     Pressure Injury 07/22/19 Nare Right;Left Stage 2 -  Partial thickness loss of dermis presenting as a shallow open injury with a red, pink wound bed without slough. Injury r/t cortrak (Active)  07/22/19 1000  Location: Nare  Location Orientation: Right;Left  Staging: Stage 2 -  Partial thickness loss of dermis presenting as a shallow open injury with a red, pink wound bed without slough.  Wound Description (Comments): Injury r/t cortrak  Present on Admission: No     Nutrition Problem:  Severe Malnutrition Etiology: chronic illness(pericardial effusion w/ volume overload)    Signs/Symptoms: moderate fat depletion, severe muscle depletion, percent weight loss    Interventions: Ensure Enlive (each supplement provides 350kcal and 20 grams of protein), Tube feeding  Estimated body mass index is 14.2 kg/m as calculated from the following:   Height as of this encounter: 5' 6"  (1.676 m).   Weight as of this encounter: 39.9 kg.   DVT prophylaxis: Heparin Code  Status: full code Family Communication: care discussed with Husband 1-18 Disposition Plan: PT evaluation to see if patient qualify for CIR, awaiting result CCP, SPEP, scleroderma evaluation. trying to transfer patient to [facility] center. No beds available.   Consultants:   Cardiology  CCM  CVTS  Palliative  Nephrology  Procedures:   HD  Pericardial window  Antimicrobials:  None currently   Subjective: Alert, answering questions. Just came from HD.  CBG low, I suspect if not accurate result.  She has been feeling stroguer. She was able to sleep last night. She feels odynophagia has improved some.   Objective: Vitals:   08/06/19 2319 08/07/19 0313 08/07/19 0623 08/07/19 0743  BP: 129/84 130/82 124/79 140/87  Pulse: 71 74 71 75  Resp: 18 19  (!) 28  Temp: (!) 97.5 F (36.4 C) (!) 97.5 F (36.4 C)    TempSrc: Axillary Axillary    SpO2: 100% 100%  100%  Weight:  39.9 kg    Height:        Intake/Output Summary (Last 24 hours) at 08/07/2019 0819 Last data filed at 08/07/2019 0000 Gross per 24 hour  Intake 773 ml  Output --  Net 773 ml   Filed Weights   08/05/19 0406 08/06/19 0315 08/07/19 0313  Weight: 44.9 kg 43.5 kg 39.9 kg    Examination:  General exam: Chronic ill, alert, following command Respiratory system: Normal respiratory effort Cardiovascular system: S 1, S 2 RRR Gastrointestinal system: BS present, soft, peg tube in place Central nervous system: Alert, follows command.  Extremities; trace edema, skin is hard, thick, reynold phenomenon hands.    Data Reviewed: I have personally reviewed following labs and imaging studies  CBC: Recent Labs  Lab 08/02/19 0700 08/04/19 0700  WBC 9.3 4.4  HGB 9.9* 9.2*  HCT 32.1* 32.1*  MCV 96.7 99.1  PLT 285 283   Basic Metabolic Panel: Recent Labs  Lab 08/02/19 0413 08/03/19 0440 08/04/19 0306 08/05/19 0539 08/06/19 0253  NA 132* 133* 131* 130* 129*  K 6.1* 5.3* 5.5* 4.6 5.3*  CL 92* 92* 91*  91* 90*  CO2 25 27 24 24  21*  GLUCOSE 124* 148* 140* 149* 137*  BUN 121* 98* 129* 79* 121*  CREATININE 4.63* 4.02* 4.74* 3.17* 3.95*  CALCIUM 8.9 8.7* 8.7* 9.1 9.1  PHOS 7.0* 5.3* 7.2* 5.2* 6.3*   GFR: Estimated Creatinine Clearance: 8.3 mL/min (A) (by C-G formula based on SCr of 3.95 mg/dL (H)). Liver Function Tests: Recent Labs  Lab 08/02/19 0413 08/03/19 0440 08/04/19 0306 08/05/19 0539 08/06/19 0253  ALBUMIN 2.5* 2.5* 2.5* 3.2* 3.1*   No results for input(s): LIPASE, AMYLASE in the last 168 hours. Recent Labs  Lab 08/04/19 1158  AMMONIA 29   Coagulation Profile: No results for input(s): INR, PROTIME in the last 168 hours. Cardiac Enzymes: No results for input(s): CKTOTAL, CKMB, CKMBINDEX, TROPONINI in the last 168 hours. BNP (last 3 results) No results for input(s): PROBNP in the last 8760 hours. HbA1C: No results for input(s): HGBA1C in the last  72 hours. CBG: Recent Labs  Lab 08/06/19 1641 08/06/19 1939 08/06/19 2322 08/07/19 0315 08/07/19 0746  GLUCAP 105* 118* 149* 138* 87   Lipid Profile: No results for input(s): CHOL, HDL, LDLCALC, TRIG, CHOLHDL, LDLDIRECT in the last 72 hours. Thyroid Function Tests: No results for input(s): TSH, T4TOTAL, FREET4, T3FREE, THYROIDAB in the last 72 hours. Anemia Panel: No results for input(s): VITAMINB12, FOLATE, FERRITIN, TIBC, IRON, RETICCTPCT in the last 72 hours. Sepsis Labs: No results for input(s): PROCALCITON, LATICACIDVEN in the last 168 hours.  No results found for this or any previous visit (from the past 240 hour(s)).       Radiology Studies: CT ABDOMEN PELVIS WO CONTRAST  Result Date: 08/05/2019 CLINICAL DATA:  Acute upper abdominal pain. EXAM: CT ABDOMEN AND PELVIS WITHOUT CONTRAST TECHNIQUE: Multidetector CT imaging of the abdomen and pelvis was performed following the standard protocol without IV contrast. COMPARISON:  None. FINDINGS: Lower chest: Bilateral pleural effusions are noted with adjacent  subsegmental atelectasis, right greater than left. Hepatobiliary: No focal liver abnormality is seen. No gallstones, gallbladder wall thickening, or biliary dilatation. Pancreas: Unremarkable. No pancreatic ductal dilatation or surrounding inflammatory changes. Spleen: Normal in size without focal abnormality. Adrenals/Urinary Tract: Adrenal glands are unremarkable. Kidneys are normal, without renal calculi, focal lesion, or hydronephrosis. Bladder is unremarkable. Stomach/Bowel: Gastrostomy tube is well positioned within gastric lumen. Status post appendectomy. There is no evidence of bowel obstruction or inflammation. Vascular/Lymphatic: No significant vascular findings are present. No enlarged abdominal or pelvic lymph nodes. Reproductive: Uterus and bilateral adnexa are unremarkable. Other: No hernia is noted. Small amount of free fluid is noted in the posterior portion of the pelvis. Musculoskeletal: No acute or significant osseous findings. IMPRESSION: 1. Bilateral pleural effusions are noted with adjacent subsegmental atelectasis, right greater than left. 2. Gastrostomy tube is well positioned within gastric lumen. 3. Small amount of free fluid is noted in the posterior portion of the pelvis. 4. No other significant abnormality seen in the abdomen or pelvis. Electronically Signed   By: Marijo Conception M.D.   On: 08/05/2019 12:16   DG Chest Port 1 View  Result Date: 08/06/2019 CLINICAL DATA:  Pleural effusion. EXAM: PORTABLE CHEST 1 VIEW COMPARISON:  August 01, 2019 FINDINGS: Unchanged dialysis catheter. Persistent enlargement of the cardiac silhouette. Bilateral pleural effusions. Bilateral lower lobe atelectasis versus peribronchial airspace consolidation. Osseous structures are without acute abnormality. Soft tissues are grossly normal. IMPRESSION: 1. Persistent enlargement of the cardiac silhouette. 2. Bilateral pleural effusions. 3. Bilateral lower lobe atelectasis versus peribronchial airspace  consolidation. Electronically Signed   By: Fidela Salisbury M.D.   On: 08/06/2019 15:50        Scheduled Meds: . amLODipine  10 mg Oral Daily  . carvedilol  3.125 mg Oral BID WC  . Chlorhexidine Gluconate Cloth  6 each Topical Q0600  . collagenase   Topical BID  . darbepoetin (ARANESP) injection - NON-DIALYSIS  100 mcg Subcutaneous Q Sat-1800  . diphenhydrAMINE  25 mg Oral Once  . famotidine  10 mg Oral Daily  . feeding supplement (NEPRO CARB STEADY)  1,000 mL Per Tube Q24H  . feeding supplement (PRO-STAT SUGAR FREE 64)  30 mL Per Tube Daily  . fluconazole  200 mg Oral Daily  . gabapentin  100 mg Oral BID  . Gerhardt's butt cream   Topical TID  . heparin  5,000 Units Subcutaneous Q8H  . loratadine  10 mg Oral Daily  . mouth rinse  15 mL Mouth Rinse  BID  . methylPREDNISolone (SOLU-MEDROL) injection  50 mg Intravenous Daily  . neomycin-bacitracin-polymyxin   Topical BID  . nystatin  5 mL Mouth/Throat QID  . pantoprazole (PROTONIX) IV  40 mg Intravenous Q12H  . sertraline  25 mg Oral BID  . sodium chloride flush  3 mL Intravenous Q12H   Continuous Infusions: . sodium chloride    . sodium chloride    . sodium chloride    . thiamine injection 250 mg (08/06/19 2117)     LOS: 34 days    Time spent: 35 minutes    Tamiah Dysart A Toney Lizaola, MD [facility]   If 7PM-7AM, please contact night-coverage [email or web address] Password [facility] 08/07/2019, 8:19 AM

## 2019-08-07 NOTE — Progress Notes (Signed)
Palliative Care Follow-up  Patient seen and evaluated-she looks much improved since last week. Pain is better, tolerating steroids. Sitting on the side of the bed with her husbands assistance.  Recommendations:  1. Contacted Rheumatology with Consulting privileges at [facility] provided by Dr. Hulen Skains. 2. Continue high dose steroids for several days 3. Her mucositis is severe from mouth breathing and poor PO intake- will try sodium bicarb/sodium chloride rinse to loosen mucous- she may have a throat infection and has honey crusted lesions on her palate and around her mouth. Consider obtaining a throat culture. Can also use Coca[employer]Cola full strength to remove crusting on her palate.  Lane Hacker, DO Palliative Medicine Time: 35 min Greater than 50%  of this time was spent counseling and coordinating care related to the above assessment and plan.

## 2019-08-07 NOTE — Procedures (Signed)
I was present at this dialysis session. I have reviewed the session itself and made appropriate changes.   Pt seen on HD tolerating treatment. 2K bath TDC. Goal UF 3L.  BP stable. RFP pending. Hb 9.9.   Filed Weights   08/06/19 0315 08/07/19 0313 08/07/19 0817  Weight: 43.5 kg 39.9 kg 39.9 kg    Recent Labs  Lab 08/06/19 0253  NA 129*  K 5.3*  CL 90*  CO2 21*  GLUCOSE 137*  BUN 121*  CREATININE 3.95*  CALCIUM 9.1  PHOS 6.3*    Recent Labs  Lab 08/02/19 0700 08/04/19 0700 08/07/19 0854  WBC 9.3 4.4 2.8*  HGB 9.9* 9.2* 9.9*  HCT 32.1* 32.1* 34.6*  MCV 96.7 99.1 98.3  PLT 285 274 372    Scheduled Meds: . amLODipine  10 mg Oral Daily  . carvedilol  3.125 mg Oral BID WC  . Chlorhexidine Gluconate Cloth  6 each Topical Q0600  . collagenase   Topical BID  . darbepoetin (ARANESP) injection - NON-DIALYSIS  100 mcg Subcutaneous Q Sat-1800  . diphenhydrAMINE  25 mg Oral Once  . famotidine  10 mg Oral Daily  . feeding supplement (NEPRO CARB STEADY)  1,000 mL Per Tube Q24H  . feeding supplement (PRO-STAT SUGAR FREE 64)  30 mL Per Tube Daily  . fluconazole  200 mg Oral Daily  . gabapentin  100 mg Oral BID  . Gerhardt's butt cream   Topical TID  . heparin  5,000 Units Subcutaneous Q8H  . loratadine  10 mg Oral Daily  . mouth rinse  15 mL Mouth Rinse BID  . methylPREDNISolone (SOLU-MEDROL) injection  50 mg Intravenous Daily  . neomycin-bacitracin-polymyxin   Topical BID  . nystatin  5 mL Mouth/Throat QID  . pantoprazole (PROTONIX) IV  40 mg Intravenous Q12H  . sertraline  25 mg Oral BID  . sodium chloride flush  3 mL Intravenous Q12H   Continuous Infusions: . sodium chloride    . sodium chloride    . sodium chloride    . albumin human    . thiamine injection 250 mg (08/06/19 2117)   PRN Meds:.sodium chloride, sodium chloride, sodium chloride, acetaminophen (TYLENOL) oral liquid 160 mg/5 mL, alteplase, alum & mag hydroxide-simeth, antiseptic oral rinse, bisacodyl,  bisacodyl, camphor-menthol, fentaNYL (SUBLIMAZE) injection, heparin, hydrOXYzine, lidocaine (PF), lidocaine-prilocaine, ondansetron (ZOFRAN) IV, oxyCODONE, pentafluoroprop-tetrafluoroeth, polyethylene glycol, polyvinyl alcohol, senna-docusate, sodium chloride flush, witch hazel-glycerin   Pearson Grippe  MD 08/07/2019, 9:13 AM

## 2019-08-07 NOTE — Progress Notes (Signed)
Palliative Care Follow-up Note  Discussed results of very high RF level (>70 with husband. 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 I contacted [facility] for assistance obtaining inpatient Rheumatology consult during her hospitalization for guidance in treatment options given the complexity and severity of her illness. Tranfer to tertiary center is not available due to capacity issues and covid. Patient now on steroids. Will continue to follow for symptom management and ongoing goals of care. For her mouth pain and mucositis will start MMW.  Lane Hacker, DO Palliative Medicine

## 2019-08-08 ENCOUNTER — Inpatient Hospital Stay (HOSPITAL_COMMUNITY): Payer: Medicare Other

## 2019-08-08 ENCOUNTER — Encounter (HOSPITAL_COMMUNITY): Payer: Self-pay | Admitting: Cardiology

## 2019-08-08 DIAGNOSIS — J810 Acute pulmonary edema: Secondary | ICD-10-CM

## 2019-08-08 DIAGNOSIS — R9389 Abnormal findings on diagnostic imaging of other specified body structures: Secondary | ICD-10-CM

## 2019-08-08 DIAGNOSIS — D709 Neutropenia, unspecified: Secondary | ICD-10-CM

## 2019-08-08 DIAGNOSIS — L940 Localized scleroderma [morphea]: Secondary | ICD-10-CM

## 2019-08-08 LAB — GLUCOSE, CAPILLARY
Glucose-Capillary: 77 mg/dL (ref 70–99)
Glucose-Capillary: 75 mg/dL (ref 70–99)
Glucose-Capillary: 52 mg/dL — ABNORMAL LOW (ref 70–99)
Glucose-Capillary: 66 mg/dL — ABNORMAL LOW (ref 70–99)
Glucose-Capillary: 83 mg/dL (ref 70–99)
Glucose-Capillary: 89 mg/dL (ref 70–99)
Glucose-Capillary: 85 mg/dL (ref 70–99)

## 2019-08-08 LAB — MISC LABCORP TEST (SEND OUT): Labcorp test code: 520021

## 2019-08-08 LAB — RENAL FUNCTION PANEL
Albumin: 3.3 g/dL — ABNORMAL LOW (ref 3.5–5.0)
Anion gap: 17 — ABNORMAL HIGH (ref 5–15)
BUN: 99 mg/dL — ABNORMAL HIGH (ref 8–23)
CO2: 20 mmol/L — ABNORMAL LOW (ref 22–32)
Calcium: 9.1 mg/dL (ref 8.9–10.3)
Chloride: 96 mmol/L — ABNORMAL LOW (ref 98–111)
Creatinine, Ser: 3.4 mg/dL — ABNORMAL HIGH (ref 0.44–1.00)
GFR calc Af Amer: 15 mL/min — ABNORMAL LOW (ref >60)
GFR calc non Af Amer: 13 mL/min — ABNORMAL LOW (ref >60)
Glucose, Bld: 114 mg/dL — ABNORMAL HIGH (ref 70–99)
Phosphorus: 4.6 mg/dL (ref 2.5–4.6)
Potassium: 5.3 mmol/L — ABNORMAL HIGH (ref 3.5–5.1)
Sodium: 133 mmol/L — ABNORMAL LOW (ref 135–145)

## 2019-08-08 LAB — CBC WITH DIFFERENTIAL/PLATELET
Abs Immature Granulocytes: 0 K/uL (ref 0.00–0.07)
Basophils Absolute: 0 K/uL (ref 0.0–0.1)
Basophils Relative: 0 %
Eosinophils Absolute: 0.1 K/uL (ref 0.0–0.5)
Eosinophils Relative: 5 %
HCT: 36.8 % (ref 36.0–46.0)
Hemoglobin: 10.9 g/dL — ABNORMAL LOW (ref 12.0–15.0)
Lymphocytes Relative: 39 %
Lymphs Abs: 0.5 K/uL — ABNORMAL LOW (ref 0.7–4.0)
MCH: 28.5 pg (ref 26.0–34.0)
MCHC: 29.6 g/dL — ABNORMAL LOW (ref 30.0–36.0)
MCV: 96.1 fL (ref 80.0–100.0)
Monocytes Absolute: 0.2 K/uL (ref 0.1–1.0)
Monocytes Relative: 15 %
Neutro Abs: 0.6 K/uL — ABNORMAL LOW (ref 1.7–7.7)
Neutrophils Relative %: 41 %
Platelets: 291 K/uL (ref 150–400)
RBC: 3.83 MIL/uL — ABNORMAL LOW (ref 3.87–5.11)
RDW: 21.2 % — ABNORMAL HIGH (ref 11.5–15.5)
WBC: 1.4 K/uL — CL (ref 4.0–10.5)
nRBC: 6.6 % — ABNORMAL HIGH (ref 0.0–0.2)
nRBC: 7 /100{WBCs} — ABNORMAL HIGH

## 2019-08-08 LAB — PROTEIN ELECTROPHORESIS, SERUM
A/G Ratio: 1.5 (ref 0.7–1.7)
Albumin ELP: 3.7 g/dL (ref 2.9–4.4)
Alpha-1-Globulin: 0.3 g/dL (ref 0.0–0.4)
Alpha-2-Globulin: 0.8 g/dL (ref 0.4–1.0)
Beta Globulin: 0.6 g/dL — ABNORMAL LOW (ref 0.7–1.3)
Gamma Globulin: 0.8 g/dL (ref 0.4–1.8)
Globulin, Total: 2.5 g/dL (ref 2.2–3.9)
Total Protein ELP: 6.2 g/dL (ref 6.0–8.5)

## 2019-08-08 LAB — HEAVY METALS, BLOOD
Arsenic: 8 ug/L (ref 2–23)
Lead: 3 ug/dL (ref 0–4)
Mercury: 1.4 ug/L (ref 0.0–14.9)

## 2019-08-08 LAB — BASIC METABOLIC PANEL WITH GFR
Anion gap: 16 — ABNORMAL HIGH (ref 5–15)
BUN: 123 mg/dL — ABNORMAL HIGH (ref 8–23)
CO2: 21 mmol/L — ABNORMAL LOW (ref 22–32)
Calcium: 9.3 mg/dL (ref 8.9–10.3)
Chloride: 95 mmol/L — ABNORMAL LOW (ref 98–111)
Creatinine, Ser: 3.92 mg/dL — ABNORMAL HIGH (ref 0.44–1.00)
GFR calc Af Amer: 13 mL/min — ABNORMAL LOW
GFR calc non Af Amer: 11 mL/min — ABNORMAL LOW
Glucose, Bld: 95 mg/dL (ref 70–99)
Potassium: 5 mmol/L (ref 3.5–5.1)
Sodium: 132 mmol/L — ABNORMAL LOW (ref 135–145)

## 2019-08-08 LAB — CBC
HCT: 40.4 % (ref 36.0–46.0)
Hemoglobin: 11.5 g/dL — ABNORMAL LOW (ref 12.0–15.0)
MCH: 28.4 pg (ref 26.0–34.0)
MCHC: 28.5 g/dL — ABNORMAL LOW (ref 30.0–36.0)
MCV: 99.8 fL (ref 80.0–100.0)
Platelets: 243 10*3/uL (ref 150–400)
RBC: 4.05 MIL/uL (ref 3.87–5.11)
RDW: 21.2 % — ABNORMAL HIGH (ref 11.5–15.5)
WBC: 1.5 10*3/uL — ABNORMAL LOW (ref 4.0–10.5)
nRBC: 6.9 % — ABNORMAL HIGH (ref 0.0–0.2)

## 2019-08-08 LAB — CYCLIC CITRUL PEPTIDE ANTIBODY, IGG/IGA: CCP Antibodies IgG/IgA: 6 units (ref 0–19)

## 2019-08-08 LAB — VITAMIN B1: Vitamin B1 (Thiamine): 192.5 nmol/L (ref 66.5–200.0)

## 2019-08-08 MED ORDER — PREDNISONE 20 MG PO TABS
20.0000 mg | ORAL_TABLET | Freq: Every day | ORAL | Status: DC
  Administered 2019-08-08 – 2019-08-09 (×2): 20 mg via ORAL
  Filled 2019-08-08 (×2): qty 1

## 2019-08-08 MED ORDER — PREDNISONE 10 MG PO TABS
10.0000 mg | ORAL_TABLET | Freq: Once | ORAL | Status: AC
  Administered 2019-08-09: 10 mg via ORAL
  Filled 2019-08-08: qty 1

## 2019-08-08 MED ORDER — THIAMINE HCL 100 MG PO TABS
100.0000 mg | ORAL_TABLET | Freq: Every day | ORAL | Status: DC
  Administered 2019-08-08: 100 mg
  Filled 2019-08-08: qty 1

## 2019-08-08 MED ORDER — DICLOFENAC SODIUM 1 % EX GEL
2.0000 g | Freq: Two times a day (BID) | CUTANEOUS | Status: DC
  Administered 2019-08-08 – 2019-08-23 (×25): 2 g via TOPICAL
  Filled 2019-08-08: qty 100

## 2019-08-08 MED ORDER — ACETAMINOPHEN 500 MG PO TABS
500.0000 mg | ORAL_TABLET | Freq: Two times a day (BID) | ORAL | Status: DC
  Administered 2019-08-08 – 2019-08-23 (×29): 500 mg via ORAL
  Filled 2019-08-08 (×30): qty 1

## 2019-08-08 MED ORDER — DEXTROSE 50 % IV SOLN
INTRAVENOUS | Status: AC
  Administered 2019-08-08: 50 mL
  Filled 2019-08-08: qty 50

## 2019-08-08 MED ORDER — AMLODIPINE BESYLATE 10 MG PO TABS
10.0000 mg | ORAL_TABLET | Freq: Every day | ORAL | Status: DC
  Administered 2019-08-08 – 2019-08-10 (×3): 10 mg via ORAL
  Filled 2019-08-08 (×3): qty 1

## 2019-08-08 NOTE — Consult Note (Addendum)
[facility]  [phone] [phone]) [phone]  ID: Radonna Ricker OB: 1950/12/21 MR#: [id number] VEH#:[id number] PCP: Renaldo Reel, DO  CHIEF COMPLAINT: Concern for underlying malignancy  INTERVAL HISTORY: Ms. Molden is a 69 year old female from [place], [place] with a past medical history significant for chronic pericardial effusion, moderate AVR, hypertension, hepatic steatosis, anasarca thought to be related to her liver disease, peripheral neuropathy.  She was admitted to the hospital following evaluation by cardiology due to worsening shortness of breath, tachycardia, and tachypnea.  She has had a prolonged hospitalization.  On admission, she had a CT angiogram of the chest on 07/03/2019 which showed no evidence of PE but did show some interstitial and airspace opacity with bilateral pleural effusions and pericardial effusion worrisome for an acute infectious process with superimposed edema/heart failure and also showed subcentimeter low-attenuation mediastinal hilar adenopathy which is likely reactive.  She had a CT of the abdomen pelvis without contrast performed on 08/05/2019 which showed bilateral pleural effusions, gastrostomy tube well positioned within the gastric lumen, small amount of free fluid noted in the posterior portion of the pelvis, no other significant abnormality noted.  Additionally, she has had multiple other testing performed including a repeated thoracenteses without evidence of malignant cells, a pericardial window on 07/12/2019 and biopsy did not show any evidence of malignancy, double-stranded DNA negative, ANA negative rheumatoid factor elevated at 71, anticentromere antibody 0.2 which is negative scleroderma antibody less than 0.2, CCP negative.  She had an SPEP performed on 04/04/2019 which not show an M spike with normal quantitative immunoglobulins.  Repeat SPEP is pending.  UPEP has been ordered but the patient is anuric The patient has  developed acute kidney injury and is currently receiving hemodialysis.  Additionally, I have reviewed her outside records through care everywhere.  The patient was admitted to [facility] at the end of September 2020.  I note that she had a small bowel and gastric biopsy on 04/19/2019 which were negative for malignancy and negative for amyloid.  She had a liver biopsy on 05/30/2019 as an outpatient which showed mild steatosis with mild to moderate fibrosis (stage Ib).  The patient's husband is at the bedside and provides the history as the patient is very sleepy this afternoon.  The patient's husband reports that she has been having difficulty with swelling from her feet up to the thighs since about June 2020.  She was having a lot of pain related to the edema. She was initially seen by rheumatology without any significant findings and was referred to cardiology.  Cardiology treated her with diuretics with little to no improvement.  She was hospitalized at [facility] in early September 2020 with an unrevealing work-up.  She was subsequently hospitalized at [facility] at the end of September 2020 and her husband ports that the anasarca was thought to be related to fatty liver disease.  They were advised to adjust her diet.  Hepatology anticipated that her edema would improve with improvement of her albumin.  However, she continued to have persistent anasarca and underwent a liver biopsy in November 2020.  The liver biopsy showed mild steatosis without significant fibrosis and hepatology felt as though her liver disease could not explain all of her anasarca.  Additionally, she has been seen by neurology due to her pain and was started on gabapentin which helped her pain significantly.  The patient developed worsening shortness of breath which prompted admission to the hospital.  Medical oncology was asked see the patient  to make recommendations and assist with work-up for possible underlying malignancy.  REVIEW OF SYSTEMS:  Review of systems obtained from her husband.  She has a poor appetite and difficulty swallowing.  She also has persistent pain to her tailbone.  She has not been having any fevers or chills.  No night sweats have been noted.  She is not currently complaining of chest discomfort or shortness of breath.  Does have intermittent nausea but has not been having any vomiting.  Bowels move every other day.  She is not currently having any urine output.  Lower extremity edema has improved significantly since hospitalization.  The patient's husband thinks that she has been more awake and interactive since starting her on steroids. The remainder of the review of systems is noncontributory.  PAST MEDICAL HISTORY: Past Medical History:  Diagnosis Date   Hypertension    Leg swelling 03/2019   Short of breath on exertion 03/2019   PAST SURGICAL HISTORY: Past Surgical History:  Procedure Laterality Date   APPENDECTOMY     IR FLUORO GUIDE CV LINE LEFT  07/18/2019   IR GASTROSTOMY TUBE MOD SED  07/28/2019   IR US GUIDE VASC ACCESS LEFT  07/18/2019   NECK SURGERY     RIGHT AND LEFT HEART CATH N/A 07/06/2019   Procedure: RIGHT AND LEFT HEART CATH;  Surgeon: Jolaine Artist, MD;  Location: [facility];  Service: Cardiovascular;  Laterality: N/A;   TEE WITHOUT CARDIOVERSION N/A 03/28/2019   Procedure: TRANSESOPHAGEAL ECHOCARDIOGRAM (TEE);  Surgeon: Geralynn Rile, MD;  Location: [facility];  Service: Cardiology;  Laterality: N/A;   VIDEO ASSISTED THORACOSCOPY Right 07/12/2019   Procedure: VIDEO ASSISTED THORACOSCOPY Pericardial window ;  Surgeon: Lajuana Matte, MD;  Location: [facility];  Service: Thoracic;  Laterality: Right;   FAMILY HISTORY Family History  Problem Relation Age of Onset   Hypertension Mother    SOCIAL HISTORY: The patient is married to her husband, Joneen Caraway.  They live in [place], [place] which is near [place].  They have 1 daughter who lives in [place],  [place] who works in a [employer].  The patient has a remote history of smoking 1/2 pack a day of cigarettes x10 years but quit in 1980.  The patient has not had any alcohol in approximately 6 months.  Prior to this, she drank 2 to 3 glasses of wine per evening.  ADVANCED DIRECTIVES: The patient's husband states that she has a living will and healthcare power of attorney.  He is unsure if they were on file with the hospital but plans to bring them.  HEALTH MAINTENANCE: Social History   Tobacco Use   Smoking status: Former Smoker    Packs/day: 0.50    Years: 10.00    Pack years: 5.00    Types: Cigarettes    Quit date: 03/20/1979    Years since quitting: 40.4   Smokeless tobacco: Never Used   Tobacco comment: pt states she smoked from her early 73s to early 21s.  Substance Use Topics   Alcohol use: No   Drug use: No   Colonoscopy: Within the past few years per her husband.  Husband reports colonoscopy was normal.  PAP: Unsure most recent Pap  Bone density: unknown  Lipid panel: 07/12/2019, normal except for low HDL  No Known Allergies Current Facility-Administered Medications  Medication Dose Route Frequency Provider Last Rate Last Admin   0.9 %  sodium chloride infusion  250 mL Intravenous PRN  Antony Odea, PA-C       0.9 %  sodium chloride infusion  100 mL Intravenous PRN Madelon Lips, MD       0.9 %  sodium chloride infusion  100 mL Intravenous PRN Madelon Lips, MD       acetaminophen (TYLENOL) 160 MG/5ML solution 500 mg  500 mg Per Tube Q6H PRN Skeet Latch, MD   500 mg at 08/04/19 1647   acetaminophen (TYLENOL) tablet 500 mg  500 mg Oral BID Regalado, Belkys A, MD       alteplase (CATHFLO ACTIVASE) injection 2 mg  2 mg Intracatheter Once PRN Madelon Lips, MD       alum & mag hydroxide-simeth (MAALOX/MYLANTA) 200-200-20 MG/5ML suspension 30 mL  30 mL Oral Q4H PRN Dorothy Spark, MD   30 mL at 07/23/19 2252   amLODipine (NORVASC) tablet 10 mg  10 mg  Oral Daily Regalado, Belkys A, MD       antiseptic oral rinse (BIOTENE) solution 15 mL  15 mL Mouth Rinse PRN Acquanetta Chain, DO       bisacodyl (DULCOLAX) EC tablet 10 mg  10 mg Oral Daily PRN Rosita Fire, MD       bisacodyl (DULCOLAX) suppository 10 mg  10 mg Rectal Daily PRN Madelon Lips, MD       camphor-menthol [facility]) lotion   Topical PRN Antony Odea, PA-C   Given at 08/06/19 1400   carvedilol (COREG) tablet 3.125 mg  3.125 mg Oral BID WC Donato Heinz, MD   3.125 mg at 08/08/19 2706   Chlorhexidine Gluconate Cloth 2 % PADS 6 each  6 each Topical Q0600 Corliss Parish, MD   6 each at 08/08/19 2376   collagenase (SANTYL) ointment   Topical BID Nahser, Wonda Cheng, MD   Given at 08/07/19 2218   Darbepoetin Alfa (ARANESP) injection 100 mcg  100 mcg Subcutaneous Q Sat-1800 Dorothy Spark, MD   100 mcg at 08/05/19 1900   dextrose (GLUTOSE) 40 % oral gel 37.5 g  1 Tube Oral Once Regalado, Belkys A, MD       diclofenac Sodium (VOLTAREN) 1 % topical gel 2 g  2 g Topical BID Regalado, Belkys A, MD       diphenhydrAMINE (BENADRYL) capsule 25 mg  25 mg Oral Once Roddenberry, Myron G, PA-C       famotidine (PEPCID) tablet 10 mg  10 mg Oral Daily Dorothy Spark, MD   10 mg at 08/07/19 1244   feeding supplement (NEPRO CARB STEADY) liquid 1,000 mL  1,000 mL Per Tube Q24H Dorothy Spark, MD 40 mL/hr at 08/08/19 0330 1,000 mL at 08/08/19 0330   feeding supplement (PRO-STAT SUGAR FREE 64) liquid 30 mL  30 mL Per Tube Daily Skeet Latch, MD   30 mL at 08/07/19 1244   fentaNYL (SUBLIMAZE) injection 12.5-25 mcg  12.5-25 mcg Intravenous Q1H PRN Lane Hacker L, DO   12.5 mcg at 08/07/19 1058   fluconazole (DIFLUCAN) tablet 200 mg  200 mg Oral Daily Carney, Gay Filler, RPH   200 mg at 08/07/19 2219   gabapentin (NEURONTIN) capsule 100 mg  100 mg Oral BID Lane Hacker L, DO   100 mg at 08/07/19 2219   Gerhardt's butt cream   Topical TID Nahser,  Wonda Cheng, MD   Given at 08/07/19 2218   heparin injection 1,000 Units  1,000 Units Dialysis PRN Madelon Lips, MD   3,800 Units at 08/07/19 1126  heparin injection 5,000 Units  5,000 Units Subcutaneous Q8H Ascencion Dike, PA-C   5,000 Units at 08/08/19 2536   hydrOXYzine (ATARAX/VISTARIL) tablet 12.5 mg  12.5 mg Oral BID PRN Antony Odea, PA-C   12.5 mg at 07/30/19 0057   lidocaine (PF) (XYLOCAINE) 1 % injection 5 mL  5 mL Intradermal PRN Madelon Lips, MD       lidocaine-prilocaine (EMLA) cream 1 application  1 application Topical PRN Madelon Lips, MD       loratadine (CLARITIN) tablet 10 mg  10 mg Oral Daily Lane Hacker L, DO   10 mg at 08/07/19 1244   MEDLINE mouth rinse  15 mL Mouth Rinse BID Roddenberry, Myron G, PA-C   15 mL at 08/07/19 2220   neomycin-bacitracin-polymyxin (NEOSPORIN) ointment packet   Topical BID Daune Perch, NP   1 application at 64/40/34 2220   nystatin (MYCOSTATIN) 100000 UNIT/ML suspension 500,000 Units  5 mL Mouth/Throat QID Skeet Latch, MD   500,000 Units at 08/07/19 2218   ondansetron (ZOFRAN) injection 4 mg  4 mg Intravenous Q6H PRN Antony Odea, PA-C   4 mg at 08/04/19 2041   oxyCODONE (Oxy IR/ROXICODONE) immediate release tablet 5 mg  5 mg Oral Q8H PRN Regalado, Belkys A, MD   5 mg at 08/07/19 2219   pantoprazole (PROTONIX) injection 40 mg  40 mg Intravenous Q12H Regalado, Belkys A, MD   40 mg at 08/07/19 2219   pentafluoroprop-tetrafluoroeth (GEBAUERS) aerosol 1 application  1 application Topical PRN Madelon Lips, MD       polyethylene glycol (MIRALAX / GLYCOLAX) packet 17 g  17 g Oral Daily PRN Rosita Fire, MD       polyvinyl alcohol (LIQUIFILM TEARS) 1.4 % ophthalmic solution 1 drop  1 drop Both Eyes BID PRN Einar Grad, [facility]       [START ON 08/09/2019] predniSONE (DELTASONE) tablet 10 mg  10 mg Oral Once Regalado, Belkys A, MD       predniSONE (DELTASONE) tablet 20 mg  20 mg Oral Q breakfast  Regalado, Belkys A, MD       senna-docusate (Senokot-S) tablet 1 tablet  1 tablet Oral QHS PRN Rosita Fire, MD       sertraline (ZOLOFT) tablet 25 mg  25 mg Oral BID Roddenberry, Myron G, PA-C   25 mg at 08/07/19 2012   sodium chloride flush (NS) 0.9 % injection 3 mL  3 mL Intravenous Q12H Roddenberry, Myron G, PA-C   3 mL at 08/07/19 2223   sodium chloride flush (NS) 0.9 % injection 3 mL  3 mL Intravenous PRN Roddenberry, Myron G, PA-C   3 mL at 07/15/19 1018   sodium chloride irrigation 0.9 % 15 mL  15 mL Irrigation QID Lane Hacker L, DO   15 mL at 08/07/19 2237   thiamine tablet 100 mg  100 mg Per Tube Daily Regalado, Belkys A, MD       witch hazel-glycerin (TUCKS) pad   Topical PRN Dorothy Spark, MD       OBJECTIVE: Vitals:   08/08/19 0626 08/08/19 0717  BP: 119/79 117/79  Pulse: 85 81  Resp:  18  Temp:  97.8 F (36.6 C)  SpO2:  100%   Body mass index is 12.11 kg/m. ECOG FS:3 - Symptomatic, >50% confined to bed General: Chronically ill appearing, cachectic female resting quietly in bed Ocular: Sclerae unicteric, pupils equal, round and reactive to light Ear-nose-throat: Oral mucosa is dry, no thrush noted  Lymphatic: No cervical or supraclavicular adenopathy Lungs no rales or rhonchi, good excursion bilaterally Heart regular rate and rhythm, no murmur appreciated Abd soft, positive bowel sounds, reports mild discomfort with palpation over the right upper quadrant.  PEG tube in place without redness or drainage. MSK no focal spinal tenderness, no joint edema Neuro: Arouses to voice Extremities: No lower extremity edema is noted Skin: Skin is hard and thick with Raynaud's phenomenon in the hands  LAB RESULTS: CMP     Component Value Date/Time   NA 133 (L) 08/08/2019 0327   NA 134 03/23/2019 1150   K 5.3 (H) 08/08/2019 0327   CL 96 (L) 08/08/2019 0327   CO2 20 (L) 08/08/2019 0327   GLUCOSE 114 (H) 08/08/2019 0327   BUN 99 (H) 08/08/2019 0327   BUN 23  03/23/2019 1150   CREATININE 3.40 (H) 08/08/2019 0327   CALCIUM 9.1 08/08/2019 0327   PROT 5.3 (L) 07/14/2019 0444   PROT 5.2 (L) 03/20/2019 1647   ALBUMIN 3.3 (L) 08/08/2019 0327   ALBUMIN 3.2 (L) 03/20/2019 1647   AST 32 07/14/2019 0444   ALT 11 07/14/2019 0444   ALKPHOS 49 07/14/2019 0444   BILITOT 0.6 07/14/2019 0444   BILITOT 0.3 03/20/2019 1647   GFRNONAA 13 (L) 08/08/2019 0327   GFRAA 15 (L) 08/08/2019 0327   INo results found for: SPEP, UPEP Lab Results  Component Value Date   WBC 1.5 (L) 08/08/2019   NEUTROABS 3.9 03/23/2019   HGB 11.5 (L) 08/08/2019   HCT 40.4 08/08/2019   MCV 99.8 08/08/2019   PLT 243 08/08/2019   @LASTCHEMISTRY @ No results found for: LABCA2 No components found for: [id number] No results for input(s): INR in the last 168 hours. Urinalysis    Component Value Date/Time   COLORURINE YELLOW 07/09/2019 1650   APPEARANCEUR HAZY (A) 07/09/2019 1650   LABSPEC 1.012 07/09/2019 1650   PHURINE 5.0 07/09/2019 1650   GLUCOSEU NEGATIVE 07/09/2019 1650   HGBUR NEGATIVE 07/09/2019 1650   BILIRUBINUR NEGATIVE 07/09/2019 1650   KETONESUR NEGATIVE 07/09/2019 1650   PROTEINUR 30 (A) 07/09/2019 1650   NITRITE NEGATIVE 07/09/2019 1650   LEUKOCYTESUR MODERATE (A) 07/09/2019 1650   STUDIES: CT ABDOMEN PELVIS WO CONTRAST  Result Date: 08/05/2019 CLINICAL DATA:  Acute upper abdominal pain. EXAM: CT ABDOMEN AND PELVIS WITHOUT CONTRAST TECHNIQUE: Multidetector CT imaging of the abdomen and pelvis was performed following the standard protocol without IV contrast. COMPARISON:  None. FINDINGS: Lower chest: Bilateral pleural effusions are noted with adjacent subsegmental atelectasis, right greater than left. Hepatobiliary: No focal liver abnormality is seen. No gallstones, gallbladder wall thickening, or biliary dilatation. Pancreas: Unremarkable. No pancreatic ductal dilatation or surrounding inflammatory changes. Spleen: Normal in size without focal abnormality.  Adrenals/Urinary Tract: Adrenal glands are unremarkable. Kidneys are normal, without renal calculi, focal lesion, or hydronephrosis. Bladder is unremarkable. Stomach/Bowel: Gastrostomy tube is well positioned within gastric lumen. Status post appendectomy. There is no evidence of bowel obstruction or inflammation. Vascular/Lymphatic: No significant vascular findings are present. No enlarged abdominal or pelvic lymph nodes. Reproductive: Uterus and bilateral adnexa are unremarkable. Other: No hernia is noted. Small amount of free fluid is noted in the posterior portion of the pelvis. Musculoskeletal: No acute or significant osseous findings. IMPRESSION: 1. Bilateral pleural effusions are noted with adjacent subsegmental atelectasis, right greater than left. 2. Gastrostomy tube is well positioned within gastric lumen. 3. Small amount of free fluid is noted in the posterior portion of the pelvis. 4. No other significant abnormality  seen in the abdomen or pelvis. Electronically Signed   By: Marijo Conception M.D.   On: 08/05/2019 12:16   DG Chest 2 View  Result Date: 07/20/2019 CLINICAL DATA:  69 year old female with weakness and shortness of breath. EXAM: CHEST - 2 VIEW COMPARISON:  Chest x-ray 07/19/2019. FINDINGS: Left internal jugular PermCath with tip terminating in the right atrium. Previously noted right-sided chest tube has been removed. There is a right-sided internal jugular central venous catheter with tip terminating in the superior cavoatrial junction. A feeding tube is seen extending into the abdomen, however, the tip of the feeding tube extends below the lower margin of the image. Lung volumes are normal. Bibasilar opacities (left greater than right), which may reflect areas of atelectasis and/or consolidation. Small bilateral pleural effusions. No evidence of pulmonary edema. Heart size is mildly enlarged. Upper mediastinal contours are within normal limits. Aortic atherosclerosis. IMPRESSION: 1.  Support apparatus, as above. 2. Bibasilar opacities which may reflect areas of atelectasis and/or consolidation with superimposed small bilateral pleural effusions. 3. Mild cardiomegaly. 4. Aortic atherosclerosis. Electronically Signed   By: Vinnie Langton M.D.   On: 07/20/2019 09:28   DG Chest 2 View  Result Date: 07/19/2019 CLINICAL DATA:  Pleural effusion, pneumothorax, chest tube EXAM: CHEST - 2 VIEW COMPARISON:  07/18/2019 FINDINGS: Left dialysis catheter has been placed with the tip in the right atrium. Right central line and right chest tube remain in place, unchanged. No pneumothorax. Cardiomegaly. Small bilateral pleural effusions. Bilateral lower lobe airspace opacities, unchanged. IMPRESSION: No pneumothorax. Continued bilateral lower lobe airspace opacities concerning for pneumonia. Small effusions. Electronically Signed   By: Rolm Baptise M.D.   On: 07/19/2019 08:34   CT HEAD WO CONTRAST  Result Date: 08/02/2019 CLINICAL DATA:  Encephalopathy EXAM: CT HEAD WITHOUT CONTRAST TECHNIQUE: Contiguous axial images were obtained from the base of the skull through the vertex without intravenous contrast. COMPARISON:  None. FINDINGS: Brain: No evidence of acute infarction, hemorrhage, hydrocephalus, extra-axial collection or mass lesion/mass effect. Mild periventricular white matter hypodensity. Vascular: No hyperdense vessel or unexpected calcification. Skull: Normal. Negative for fracture or focal lesion. Sinuses/Orbits: No acute finding. Other: None. IMPRESSION: No acute intracranial pathology.  Small-vessel white matter disease. Electronically Signed   By: Eddie Candle M.D.   On: 08/02/2019 16:13   IR GASTROSTOMY TUBE MOD SED  Result Date: 07/28/2019 INDICATION: 69 year old with malnutrition and failure to thrive. Request for gastrostomy tube placement. EXAM: PERCUTANEOUS GASTROSTOMY TUBE WITH FLUOROSCOPIC GUIDANCE Physician: Stephan Minister. Anselm Pancoast, MD MEDICATIONS: Ancef 2 g; Antibiotics were administered  within 1 hour of the procedure. Glucagon 0.5 mg IV ANESTHESIA/SEDATION: Versed 2.0 mg IV; Fentanyl 100 mcg IV Moderate Sedation Time:  22 minutes The patient was continuously monitored during the procedure by the interventional radiology nurse under my direct supervision. FLUOROSCOPY TIME:  Fluoroscopy Time: 2 minutes, 48 seconds, 5 mGy COMPLICATIONS: None immediate. PROCEDURE: Informed consent was obtained for a percutaneous gastrostomy tube. The patient was placed on the interventional table. An orogastric tube was placed with fluoroscopic guidance. The anterior abdomen was prepped and draped in sterile fashion. Maximal barrier sterile technique was utilized including caps, mask, sterile gowns, sterile gloves, sterile drape, hand hygiene and skin antiseptic. Stomach was inflated with air through the orogastric tube. The skin and subcutaneous tissues were anesthetized with 1% lidocaine. A 17 gauge needle was directed into the distended stomach with fluoroscopic guidance. A wire was advanced into the stomach and a T-tact was deployed. A 9-French vascular sheath was  placed and the orogastric tube was snared using a [place]ck snare device. The orogastric tube and snare were pulled out of the patient's mouth. The snare device was connected to a 20-French gastrostomy tube. The snare device and gastrostomy tube were pulled through the patient's mouth and out the anterior abdominal wall. The gastrostomy tube was cut to an appropriate length. Contrast injection through gastrostomy tube confirmed placement within the stomach. Fluoroscopic images were obtained for documentation. The gastrostomy tube was flushed with normal saline. IMPRESSION: Successful fluoroscopic guided percutaneous gastrostomy tube placement. Electronically Signed   By: Markus Daft M.D.   On: 07/28/2019 13:59   US RENAL  Result Date: 07/09/2019 CLINICAL DATA:  Acute renal injury. EXAM: RENAL / URINARY TRACT ULTRASOUND COMPLETE COMPARISON:  None.  FINDINGS: Right Kidney: Renal measurements: 10.9 x 3.9 x 5.8 cm = volume: 128.7 mL . Echogenicity within normal limits. No mass or hydronephrosis visualized. Left Kidney: Renal measurements: 10.7 x 4.7 x 5.9 cm = volume: 154.0 mL. Echogenicity within normal limits. No mass or hydronephrosis visualized. Bladder: Appears normal for degree of bladder distention. Other: None. IMPRESSION: Normal study.  No cause for acute renal injury identified. Electronically Signed   By: Dorise Bullion III M.D   On: 07/09/2019 15:34   IR Fluoro Guide CV Line Left  Result Date: 07/18/2019 INDICATION: End-stage renal disease. In need of durable intravenous access for the initiation of dialysis. EXAM: TUNNELED CENTRAL VENOUS HEMODIALYSIS CATHETER PLACEMENT WITH ULTRASOUND AND FLUOROSCOPIC GUIDANCE MEDICATIONS: Ancef 2 gm IV . The antibiotic was given in an appropriate time interval prior to skin puncture. ANESTHESIA/SEDATION: Versed 1 mg IV; Fentanyl 50 mcg IV; Moderate Sedation Time:  15 minutes The patient was continuously monitored during the procedure by the interventional radiology nurse under my direct supervision. FLUOROSCOPY TIME:  30 seconds (2 mGy) COMPLICATIONS: None immediate. PROCEDURE: Informed written consent was obtained from the patient after a discussion of the risks, benefits, and alternatives to treatment. Questions regarding the procedure were encouraged and answered. Given the presence of the existing right jugular approach central venous catheter, the decision was made to place a left internal jugular approach hemodialysis catheter. As such, the left neck and chest were prepped with chlorhexidine in a sterile fashion, and a sterile drape was applied covering the operative field. Maximum barrier sterile technique with sterile gowns and gloves were used for the procedure. A timeout was performed prior to the initiation of the procedure. After creating a small venotomy incision, a micropuncture kit was utilized  to access the internal jugular vein. Real-time ultrasound guidance was utilized for vascular access including the acquisition of a permanent ultrasound image documenting patency of the accessed vessel. The microwire was utilized to measure appropriate catheter length. A stiff Glidewire was advanced to the level of the IVC and the micropuncture sheath was exchanged for a peel-away sheath. A palindrome tunneled hemodialysis catheter measuring 23 cm from tip to cuff was tunneled in a retrograde fashion from the anterior chest wall to the venotomy incision. The catheter was then placed through the peel-away sheath with tips ultimately positioned within the superior aspect of the right atrium. Final catheter positioning was confirmed and documented with a spot radiographic image. The catheter aspirates and flushes normally. The catheter was flushed with appropriate volume heparin dwells. The catheter exit site was secured with a 0-Prolene retention suture. The venotomy incision was closed with an interrupted 4-0 Vicryl, Dermabond and Steri-strips. Dressings were applied. The patient tolerated the procedure well without immediate post  procedural complication. IMPRESSION: Successful placement of 23 cm tip to cuff tunneled hemodialysis catheter via the left internal jugular vein with tips terminating within the superior aspect of the right atrium. The catheter is ready for immediate use. Electronically Signed   By: Sandi Mariscal M.D.   On: 07/18/2019 14:15   IR US Guide Vasc Access Left  Result Date: 07/18/2019 INDICATION: End-stage renal disease. In need of durable intravenous access for the initiation of dialysis. EXAM: TUNNELED CENTRAL VENOUS HEMODIALYSIS CATHETER PLACEMENT WITH ULTRASOUND AND FLUOROSCOPIC GUIDANCE MEDICATIONS: Ancef 2 gm IV . The antibiotic was given in an appropriate time interval prior to skin puncture. ANESTHESIA/SEDATION: Versed 1 mg IV; Fentanyl 50 mcg IV; Moderate Sedation Time:  15 minutes The  patient was continuously monitored during the procedure by the interventional radiology nurse under my direct supervision. FLUOROSCOPY TIME:  30 seconds (2 mGy) COMPLICATIONS: None immediate. PROCEDURE: Informed written consent was obtained from the patient after a discussion of the risks, benefits, and alternatives to treatment. Questions regarding the procedure were encouraged and answered. Given the presence of the existing right jugular approach central venous catheter, the decision was made to place a left internal jugular approach hemodialysis catheter. As such, the left neck and chest were prepped with chlorhexidine in a sterile fashion, and a sterile drape was applied covering the operative field. Maximum barrier sterile technique with sterile gowns and gloves were used for the procedure. A timeout was performed prior to the initiation of the procedure. After creating a small venotomy incision, a micropuncture kit was utilized to access the internal jugular vein. Real-time ultrasound guidance was utilized for vascular access including the acquisition of a permanent ultrasound image documenting patency of the accessed vessel. The microwire was utilized to measure appropriate catheter length. A stiff Glidewire was advanced to the level of the IVC and the micropuncture sheath was exchanged for a peel-away sheath. A palindrome tunneled hemodialysis catheter measuring 23 cm from tip to cuff was tunneled in a retrograde fashion from the anterior chest wall to the venotomy incision. The catheter was then placed through the peel-away sheath with tips ultimately positioned within the superior aspect of the right atrium. Final catheter positioning was confirmed and documented with a spot radiographic image. The catheter aspirates and flushes normally. The catheter was flushed with appropriate volume heparin dwells. The catheter exit site was secured with a 0-Prolene retention suture. The venotomy incision was closed  with an interrupted 4-0 Vicryl, Dermabond and Steri-strips. Dressings were applied. The patient tolerated the procedure well without immediate post procedural complication. IMPRESSION: Successful placement of 23 cm tip to cuff tunneled hemodialysis catheter via the left internal jugular vein with tips terminating within the superior aspect of the right atrium. The catheter is ready for immediate use. Electronically Signed   By: Sandi Mariscal M.D.   On: 07/18/2019 14:15   DG Chest Port 1 View  Result Date: 08/06/2019 CLINICAL DATA:  Pleural effusion. EXAM: PORTABLE CHEST 1 VIEW COMPARISON:  August 01, 2019 FINDINGS: Unchanged dialysis catheter. Persistent enlargement of the cardiac silhouette. Bilateral pleural effusions. Bilateral lower lobe atelectasis versus peribronchial airspace consolidation. Osseous structures are without acute abnormality. Soft tissues are grossly normal. IMPRESSION: 1. Persistent enlargement of the cardiac silhouette. 2. Bilateral pleural effusions. 3. Bilateral lower lobe atelectasis versus peribronchial airspace consolidation. Electronically Signed   By: Fidela Salisbury M.D.   On: 08/06/2019 15:50   DG CHEST PORT 1 VIEW  Result Date: 08/01/2019 CLINICAL DATA:  Post thoracentesis bilateral EXAM:  PORTABLE CHEST 1 VIEW COMPARISON:  08/01/2019 FINDINGS: Marked improvement in bilateral pleural effusion post thoracentesis. No pneumothorax Improvement in diffuse bilateral airspace disease compatible with edema. Mild residual edema. Bilateral central venous catheters unchanged. IMPRESSION: Marked improvement in aeration with decreased edema and decreased pleural effusions. No pneumothorax. Electronically Signed   By: Franchot Gallo M.D.   On: 08/01/2019 13:52   DG CHEST PORT 1 VIEW  Result Date: 08/01/2019 CLINICAL DATA:  Episodes of shortness of breath and congestion for 2 days EXAM: PORTABLE CHEST 1 VIEW COMPARISON:  4 days ago FINDINGS: Bilateral airspace disease asymmetric to  the left where there is pleural effusion seen along the lateral chest wall. Chronic cardiopericardial enlargement. Bilateral central line with tips at the upper cavoatrial junction. IMPRESSION: Extensive airspace disease and sizable left pleural effusion that is more readily seen than on prior. Findings could reflect infection or CHF. Electronically Signed   By: Monte Fantasia M.D.   On: 08/01/2019 11:46   DG CHEST PORT 1 VIEW  Result Date: 07/28/2019 CLINICAL DATA:  Shortness of breath.  Negative COVID test. EXAM: PORTABLE CHEST 1 VIEW COMPARISON:  07/18/2019. FINDINGS: Interim removal of feeding tube. Right IJ line and left dialysis catheter in stable position. Cardiomegaly. Diffuse severe bilateral pulmonary infiltrates/edema. Moderate bilateral pleural effusions. No pneumothorax. IMPRESSION: 1. Interim removal of feeding tube. Right IJ line and dialysis catheter stable position. 2. Cardiomegaly. Diffuse severe bilateral pulmonary infiltrates/edema. Moderate bilateral pleural effusions. Findings suggest CHF. Electronically Signed   By: Marcello Moores  Register   On: 07/28/2019 14:14   DG CHEST PORT 1 VIEW  Result Date: 07/22/2019 CLINICAL DATA:  SOB, palpitations. EXAM: PORTABLE CHEST 1 VIEW COMPARISON:  Chest radiograph 07/20/2019 FINDINGS: Stable support apparatus. Unchanged cardiomediastinal contours with enlarged heart size. Persistent heterogeneous opacities at the right lung base and consolidation at the left lung base. Probable small bilateral effusions. No evidence of pneumothorax. IMPRESSION: Stable chest with bibasilar opacities and probable small pleural effusions. Electronically Signed   By: Audie Pinto M.D.   On: 07/22/2019 13:13   DG CHEST PORT 1 VIEW  Result Date: 07/18/2019 CLINICAL DATA:  Pleural effusion. Additional history provided: Chest tube present, pneumothorax, shortness of breath EXAM: PORTABLE CHEST 1 VIEW COMPARISON:  Chest radiograph 07/16/2019 FINDINGS: Right IJ approach  central venous catheter and right basilar chest tube, unchanged. Overlying cardiac monitoring leads. Unchanged cardiomegaly. A previously demonstrated tiny right apical pneumothorax is not well appreciated on today's study. Unchanged hazy opacity at the right lung base consistent with small effusion with atelectasis and/or pneumonia. Persistent small to moderate left pleural effusion with left basilar atelectasis. IMPRESSION: Support apparatus unchanged. A previously demonstrated tiny right apical pneumothorax is not well appreciated on today's study. Unchanged small right effusion with right basilar atelectasis and/or pneumonia. Persistent small to moderate left pleural effusion with left basilar atelectasis. Electronically Signed   By: Kellie Simmering DO   On: 07/18/2019 08:09   DG CHEST PORT 1 VIEW  Result Date: 07/16/2019 CLINICAL DATA:  Cardiopulmonary status.  Follow-up surgery. EXAM: PORTABLE CHEST 1 VIEW COMPARISON:  07/15/2019 FINDINGS: Right IJ central venous catheter unchanged with tip at the cavoatrial junction. Right-sided chest tube unchanged. Lungs are adequately inflated with stable opacification over the left mid to lower lung likely effusion with associated basilar atelectasis as infection over the mid to lower lung is possible. Stable hazy opacification over the right base. Small right effusion unchanged. Tiny right apical pneumothorax without significant change. Mild stable cardiomegaly. Remainder of the exam  is unchanged. IMPRESSION: 1. Stable hazy opacification over the left mid to lower lung likely moderate size effusion with associated atelectasis. Infection over the mid to lower lung is possible. Stable hazy density over the right base with stable small right effusion. 2.  Stable tiny right apical pneumothorax. 3.  Tubes and lines as described. Electronically Signed   By: Marin Olp M.D.   On: 07/16/2019 08:29   DG CHEST PORT 1 VIEW  Result Date: 07/15/2019 CLINICAL DATA:  Post  surgery. EXAM: PORTABLE CHEST 1 VIEW COMPARISON:  07/13/2019 FINDINGS: Right-sided chest tube unchanged. Right IJ central venous catheter unchanged with tip over the SVC. Lungs are adequately inflated demonstrate persistent hazy opacification over the left mid to lower lung and right base as findings may be due to infection. Moderate size left effusion and small right effusion are present. Small right apical pneumothorax which was not visualized on the prior exam. Stable cardiomegaly. Remainder of the exam is unchanged. IMPRESSION: 1. Stable hazy opacification over the left mid to lower lung and stable opacification over the right base as findings may be due to infection. Moderate size left effusion and small right effusion. 2. Small right apical pneumothorax with right-sided chest tube in place. Right IJ central venous catheter unchanged. These results will be called to the ordering clinician or representative by the Radiologist Assistant, and communication documented in the PACS or zVision Dashboard. Electronically Signed   By: Marin Olp M.D.   On: 07/15/2019 08:44   DG CHEST PORT 1 VIEW  Result Date: 07/13/2019 CLINICAL DATA:  Chest tube, chest pain EXAM: PORTABLE CHEST 1 VIEW COMPARISON:  07/12/2019 FINDINGS: Right chest tube remains in place without pneumothorax. Mediastinal, likely pericardial drain remains in place, unchanged. Cardiomegaly. Airspace disease in the left lower lobe again noted with small left effusion, unchanged. IMPRESSION: No significant change since prior study. Electronically Signed   By: Rolm Baptise M.D.   On: 07/13/2019 09:15   DG Chest Port 1 View  Result Date: 07/12/2019 CLINICAL DATA:  Post pericardial window EXAM: PORTABLE CHEST 1 VIEW COMPARISON:  Radiograph 07/09/2019 FINDINGS: A right IJ catheter terminates at the level of the right atrium. There is slight postoperative mediastinal widening with placement of the mediastinal drain. New right chest tube is placed as  well. There is increasing patchy opacity in the aerated portion of the left lung though much of this could be explained by layering pleural effusion. No acute osseous abnormality. Small amount of subcutaneous gas along the right chest wall, likely postprocedural. Additional support devices in telemetry leads overlie the chest. IMPRESSION: Slight postoperative mediastinal widening likely expected in the setting of pericardial window placement with a mediastinal drain in place. Increasing patchy opacity in the left lung may reflect a combination of atelectatic change and distributed pleural fluid. New right chest tube placed as well. Trace right effusion remains. Small amount of subcutaneous gas along the right chest wall is likely postprocedural Status factor positioning of a right IJ catheter. Electronically Signed   By: Lovena Le M.D.   On: 07/12/2019 20:02   DG CHEST PORT 1 VIEW  Result Date: 07/09/2019 CLINICAL DATA:  Pleural effusion EXAM: PORTABLE CHEST 1 VIEW COMPARISON:  July 06, 2019 FINDINGS: No pneumothorax. No change in cardiomegaly. The hila and mediastinum are stable. Bilateral pulmonary opacities are stable. More focal opacity in the left retrocardiac region with a probable small associated effusion is stable. No other abnormalities. IMPRESSION: 1. No interval change in the small left effusion,  focal left retrocardiac opacity, or diffuse interstitial opacities. Electronically Signed   By: Dorise Bullion III M.D   On: 07/09/2019 13:25   DG Abd Portable 1V  Result Date: 08/04/2019 CLINICAL DATA:  Abdominal pain with tube feeding. EXAM: PORTABLE ABDOMEN - 1 VIEW COMPARISON:  Radiographs dated 07/28/2019 and 07/27/2019 FINDINGS: The gastrostomy tube is in the body of the stomach, unchanged in position since the prior study of 07/28/2019. The bowel gas pattern is normal. No bone abnormality. IMPRESSION: Benign-appearing abdomen. The gastrostomy tube is in the body of the stomach.  Electronically Signed   By: Lorriane Shire M.D.   On: 08/04/2019 13:16   DG Abd Portable 1V  Result Date: 07/27/2019 CLINICAL DATA:  Dysphagia. Preoperative for gastrostomy tube placement tomorrow EXAM: PORTABLE ABDOMEN - 1 VIEW COMPARISON:  None. FINDINGS: Weighted enteric tube tip is in the distal stomach. No dilated small bowel loops. Minimal colorectal stool. No evidence of pneumatosis or pneumoperitoneum. No radiopaque nephrolithiasis. IMPRESSION: 1. Weighted enteric tube tip is in the distal stomach. 2. Nonobstructive bowel gas pattern. Electronically Signed   By: Ilona Sorrel M.D.   On: 07/27/2019 12:52   MR CARDIAC MORPHOLOGY WO CONTRAST  Result Date: 07/14/2019 CLINICAL DATA:  Evaluate AI, pericardial effusion, LVEF EXAM: CARDIAC MRI TECHNIQUE: The patient was scanned on a 1.5 Tesla Siemens magnet. A dedicated cardiac coil was used. Functional imaging was done using Fiesta sequences. 2,3, and 4 chamber views were done to assess for RWMA's. Modified Simpson's rule using a short axis stack was used to calculate an ejection fraction on a dedicated work Conservation officer, nature. No contrast administered FINDINGS: Left ventricle: - Mild dilatation - Mild systolic dysfunction LV EF:  48% (Normal 56-78%) Absolute volumes: LV EDV: 161m (Normal 52-141 mL) LV ESV: 743m(Normal 13-51 mL) LV SV: 6627mNormal 33-97 mL) CO: 5.4L/min (Normal 2.7-6.0 L/min) Indexed volumes: LV EDV: 23m20m-m (Normal 41-81 mL/sq-m) LV ESV: 44mL61mm (Normal 12-21 mL/sq-m) LV SV: 41mL/92m (Normal 26-56 mL/sq-m) CI: 3.4L/min/sq-m (Normal 1.8-3.8 L/min/sq-m) Right ventricle: Moderate dilatation with mild systolic dysfunction RV EF: 46% (Normal 47-80%) Absolute volumes: RV EDV: 183mL (10mal 58-154 mL) RV ESV: 99mL (N27ml 12-68 mL) RV SV: 84mL (No72m 35-98 mL) CO: 6.9L/min (Normal 2.7-6 L/min) Indexed volumes: RV EDV: 114mL/sq-m34mrmal 48-87 mL/sq-m) RV ESV: 62mL/sq-m 45mmal 11-28 mL/sq-m) RV SV: 52mL/sq-m (31mal 27-57  mL/sq-m) CI: 4.3L/min/sq-m (Normal 1.8-3.8 L/min/sq-m) Left atrium: Moderate enlargement Right atrium: Mild enlargement Mitral valve: Mild regurgitation Aortic valve: Tricuspid. Unable to quantify regurgitation due to artifact in velocity-encoded imaging, but visually appears at least moderate Tricuspid valve: Mild regurgitation Pericardium: Large circumferential pericardial effusion. Measures up to 25mm adjacen64m anterior wall of LV. No evidence of ventricular interdependence on real time imaging. Extracardiac structures: Moderate bilateral pleural effusions IMPRESSION: 1. Large circumferential pericardial effusion. Measures up to 25mm adjacent17manterior wall of LV. 2. No evidence of ventricular interdependence on real time imaging to suggest tamponade or constriction 3. Mild LV dilatation with mild systolic dysfunction (EF 48%) 4. Modera41%RV dilatation with mild systolic dysfunction (EF 46%) 5. Unable63% quantify aortic regurgitation due to artifact in velocity-encoded imaging, but visually appears at least moderate 6. Moderate bilateral pleural effusions 7. No gadolinium administered given renal dysfunction Electronically Signed   By: Christopher  SOswaldo Milian5/2020 21:43   US LIVER DOPPL[place]  Result Date: 07/12/2019 CLINICAL DATA:  Acute renal injury, history of hepatic steatosis, pericardial effusion EXAM: DUPLEX ULTRASOUND OF LIVER TECHNIQUE: Color and duplex  Doppler ultrasound was performed to evaluate the hepatic in-flow and out-flow vessels. COMPARISON:  CT 04/05/2019 FINDINGS: Liver: Mildly echogenic parenchyma. Normal hepatic contour without nodularity. No focal lesion, mass or intrahepatic biliary ductal dilatation. Main Portal Vein size: 1.2 cm Portal Vein Velocities (all hepatopetal): Main Prox:  35 cm/sec Main Mid: 30 cm/sec Main Dist:  33 cm/sec Right: 29 cm/sec Left: 27 cm/sec Hepatic Vein Velocities (all hepatofugal): Right:  53 cm/sec Middle:  37 cm/sec Left:  38 cm/sec IVC:  Present and patent with normal respiratory phasicity. IVC velocity: 102 cm/sec (probably overestimated due to poor angle correction) Hepatic Artery Velocity:  33 cm/sec Splenic Vein Velocity:  28 cm/sec Spleen: 8.1 cm x 11.4 cm x 4.2 cm with a total volume of 201 cm^3 (411 cm^3 is upper limit normal) Portal Vein Occlusion/Thrombus: No Splenic Vein Occlusion/Thrombus: No Ascites: Trace Varices: None identified Bilateral pleural effusions. IMPRESSION: 1. Unremarkable hepatic vascular Doppler evaluation. 2. Bilateral pleural effusions and trace abdominal ascites. Electronically Signed   By: Lucrezia Europe M.D.   On: 07/12/2019 08:56   ECHOCARDIOGRAM LIMITED  Result Date: 07/17/2019   ECHOCARDIOGRAM REPORT   Patient Name:   MUSLIMA TOPPINS Date of Exam: 07/17/2019 Medical Rec #:  [id number]    Height:       66.0 in Accession #:    [id number]   Weight:       124.3 lb Date of Birth:  January 28, 1951    BSA:          1.63 m Patient Age:    65 years     BP:           125/78 mmHg Patient Gender: F            HR:           71 bpm. Exam Location:  Inpatient Procedure: Limited Echo, Cardiac Doppler and Limited Color Doppler Indications:     I31.3 Pericardial effusion  History:         Patient has prior history of Echocardiogram examinations, most                  recent 07/09/2019. Signs/Symptoms:Dyspnea; Risk                  Factors:Hypertension.  Sonographer:     Tiffany Dance Referring Phys:  [id number] [place] Diagnosing Phys: Lyman Bishop MD IMPRESSIONS  1. Left ventricular ejection fraction, by visual estimation, is 50 to 55%. The left ventricle has low normal function. There is mildly increased left ventricular hypertrophy.  2. Moderate hypokinesis of the left ventricular, entire septal wall.  3. The left ventricle demonstrates regional wall motion abnormalities.  4. Global right ventricle has normal systolic function.The right ventricular size is normal. No increase in right ventricular wall thickness.  5. Left  atrial size was normal.  6. Right atrial size was mildly dilated.  7. Moderate pleural effusion in the left lateral region.  8. The pericardial effusion is circumferential.  9. Trivial pericardial effusion is present. 10. The mitral valve is abnormal. Mild mitral valve regurgitation. 11. The tricuspid valve is grossly normal. Tricuspid valve regurgitation moderate. 12. Aortic valve regurgitation is moderate. 13. The aortic valve is tricuspid. Aortic valve regurgitation is moderate. Mild aortic valve sclerosis without stenosis. 14. The pulmonic valve was grossly normal. Pulmonic valve regurgitation is not visualized. 15. Moderately elevated pulmonary artery systolic pressure. 16. The inferior vena cava is dilated in size with >50% respiratory variability, suggesting right atrial pressure  of 8 mmHg. FINDINGS  Left Ventricle: Left ventricular ejection fraction, by visual estimation, is 50 to 55%. The left ventricle has low normal function. Moderate hypokinesis of the left ventricular, entire septal wall. The left ventricle demonstrates regional wall motion abnormalities. There is mildly increased left ventricular hypertrophy. Right Ventricle: The right ventricular size is normal. No increase in right ventricular wall thickness. Global RV systolic function is has normal systolic function. The tricuspid regurgitant velocity is 2.91 m/s, and with an assumed right atrial pressure  of 8 mmHg, the estimated right ventricular systolic pressure is moderately elevated at 42.0 mmHg. Left Atrium: Left atrial size was normal in size. Right Atrium: Right atrial size was mildly dilated Pericardium: Trivial pericardial effusion is present. The pericardial effusion is circumferential. There is a moderate pleural effusion in the left lateral region. Mitral Valve: The mitral valve is abnormal. There is mild thickening of the mitral valve leaflet(s). Mild mitral valve regurgitation. Tricuspid Valve: The tricuspid valve is grossly normal.  Tricuspid valve regurgitation moderate. Aortic Valve: The aortic valve is tricuspid. Aortic valve regurgitation is moderate. Aortic regurgitation PHT measures 480 msec. Mild aortic valve sclerosis is present, with no evidence of aortic valve stenosis. Pulmonic Valve: The pulmonic valve was grossly normal. Pulmonic valve regurgitation is not visualized. Pulmonic regurgitation is not visualized. Aorta: The aortic root and ascending aorta are structurally normal, with no evidence of dilitation. Venous: The inferior vena cava is dilated in size with greater than 50% respiratory variability, suggesting right atrial pressure of 8 mmHg. IAS/Shunts: No atrial level shunt detected by color flow Doppler.  LEFT VENTRICLE PLAX 2D LVIDd:         4.70 cm LVIDs:         3.20 cm LV PW:         1.20 cm LV IVS:        0.80 cm LVOT diam:     1.90 cm LV SV:         61 ml LV SV Index:   37.99 LVOT Area:     2.84 cm  RIGHT VENTRICLE          IVC RV Basal diam:  3.30 cm  IVC diam: 2.10 cm RV Mid diam:    1.50 cm LEFT ATRIUM             Index       RIGHT ATRIUM           Index LA diam:        4.10 cm 2.51 cm/m  RA Area:     21.70 cm LA Vol (A2C):   71.5 ml 43.76 ml/m RA Volume:   59.90 ml  36.66 ml/m LA Vol (A4C):   47.6 ml 29.13 ml/m LA Biplane Vol: 62.6 ml 38.31 ml/m  AORTIC VALVE LVOT Vmax:   87.90 cm/s LVOT Vmean:  58.000 cm/s LVOT VTI:    0.165 m AI PHT:      480 msec  AORTA Ao Root diam: 3.90 cm Ao Asc diam:  3.70 cm MITRAL VALVE                        TRICUSPID VALVE MV Area (PHT): 4.49 cm             TR Peak grad:   34.0 mmHg MV PHT:        49.01 msec           TR Vmax:  306.00 cm/s MV Decel Time: 169 msec MV E velocity: 60.10 cm/s 103 cm/s  SHUNTS MV A velocity: 52.90 cm/s 70.3 cm/s Systemic VTI:  0.16 m MV E/A ratio:  1.14       1.5       Systemic Diam: 1.90 cm  Lyman Bishop MD Electronically signed by Lyman Bishop MD Signature Date/Time: 07/17/2019/4:21:31 PM    Final    ECHOCARDIOGRAM LIMITED  Result Date:  07/09/2019   ECHOCARDIOGRAM LIMITED REPORT   Patient Name:   JOCELINE HINCHCLIFF Date of Exam: 07/09/2019 Medical Rec #:  [id number]    Height:       66.0 in Accession #:    [id number]   Weight:       118.6 lb Date of Birth:  March 20, 1951    BSA:          1.60 m Patient Age:    19 years     BP:           146/95 mmHg Patient Gender: F            HR:           88 bpm. Exam Location:  Inpatient  Procedure: Limited Echo, Color Doppler and Cardiac Doppler Indications:    I31.3 Pericardial effusion  History:        Patient has prior history of Echocardiogram examinations, most                 recent 07/04/2019.  Sonographer:    Raquel Sarna Senior RDCS Referring Phys: [id number] [place]  1. Left ventricular ejection fraction, by visual estimation, is 45 to 50%. The left ventricle has moderately decreased function. There is no left ventricular hypertrophy.  2. The left ventricle demonstrates global hypokinesis.  3. Global right ventricle was not assessed.The right ventricular size is not well visualized. Right vetricular wall thickness was not assessed.  4. Left atrial size was not assessed.  5. Moderate pericardial effusion.  6. The pericardial effusion is circumferential.  7. The mitral valve is grossly normal. Mild mitral valve regurgitation.  8. The tricuspid valve is grossly normal. Tricuspid valve regurgitation moderate-severe.  9. The tricuspid valve was normal in structure. Tricuspid valve regurgitation moderate-severe. 10. Aortic valve regurgitation is moderate to severe. 11. The aortic valve is tricuspid. Aortic valve regurgitation is moderate to severe. 12. Aortic root could not be assessed. 13. Mildly elevated pulmonary artery systolic pressure. 14. The inferior vena cava is normal in size with <50% respiratory variability, suggesting right atrial pressure of 8 mmHg. 15. Limited echo. Effusion appears similar to just slightly enlarged from prior. Aortic regurgitation has been previously evaluated by TEE;  incomplete evaluation on this study but cannot exclude severe AR based on images. 16. The interatrial septum was not assessed. FINDINGS  Left Ventricle: Left ventricular ejection fraction, by visual estimation, is 45 to 50%. The left ventricle has moderately decreased function. The left ventricle demonstrates global hypokinesis. Right Ventricle: The right ventricular size is not assessed. Right vetricular wall thickness was not assessed. Global RV systolic function is was not assessed. The tricuspid regurgitant velocity is 2.57 m/s, and with an assumed right atrial pressure of 8  mmHg, the estimated right ventricular systolic pressure is mildly elevated at 34.4 mmHg. Left Atrium: Left atrial size was not assessed. Right Atrium: Right atrial size was not assessed. Right atrial pressure is estimated at 8 mmHg. Pericardium: A moderately sized pericardial effusion is present is seen. A moderately sized pericardial effusion  is present. The pericardial effusion is circumferential. Tricuspid Valve: The tricuspid valve is normal in structure. Tricuspid valve regurgitation moderate-severe. Aortic Valve: The aortic valve is tricuspid. Aortic valve regurgitation is moderate to severe. Pulmonic Valve: The pulmonic valve was grossly normal. Pulmonic valve regurgitation is mild to moderate by color flow Doppler. Pulmonic regurgitation is mild to moderate by color flow Doppler. Aorta: Aortic root could not be assessed. Venous: The inferior vena cava is normal in size with less than 50% respiratory variability, suggesting right atrial pressure of 8 mmHg. Shunts: The interatrial septum was not assessed.   LV Volumes (MOD)             Normals LV area d, A2C:    23.00 cm LV area d, A4C:    25.40 cm LV area s, A2C:    18.30 cm LV area s, A4C:    17.90 cm LV major d, A2C:   6.58 cm LV major d, A4C:   6.80 cm LV major s, A2C:   6.41 cm LV major s, A4C:   6.25 cm LV vol d, MOD A2C: 69.4 ml   68 ml LV vol d, MOD A4C: 79.1 ml LV vol s,  MOD A2C: 44.5 ml   24 ml LV vol s, MOD A4C: 44.5 ml LV SV MOD A2C:     24.9 ml LV SV MOD A4C:     79.1 ml LV SV MOD BP:      29.9 ml   45 ml RIGHT VENTRICLE RV S prime:     8.92 cm/s TAPSE (M-mode): 1.8 cm TRICUSPID VALVE             Normals TR Peak grad:   26.4 mmHg TR Vmax:        257.00 cm/s 288 cm/s  Buford Dresser MD Electronically signed by Buford Dresser MD Signature Date/Time: 07/09/2019/5:23:10 PM    Final    ASSESSMENT: 69 y.o. female from [place], [place] admitted with anasarca and shortness of breath.  She was found to have bilateral pleural effusions and a pericardial effusion.  She has developed acute renal failure.  Noted to have an elevated RF.  PLAN: The patient's chart and outside records have been extensively reviewed.  We have been asked to comment about possible underlying malignancy.  CT scans today have been reviewed.  She had a CT of the abdomen pelvis performed on 08/05/2019 which did not show any evidence of malignancy.  A CT angiogram of the chest was performed on admission on 07/04/2019 which did not show any obvious masses.  The patient's pleural fluid has been negative for malignancy x3.  Pericardial fluid was also negative for malignancy.  Biopsy of the pericardium was negative for malignancy.  We will repeat a CT of the chest without contrast since the last one was performed approximately 1 month ago.  We will hold off on repeating a CT of the abdomen pelvis given that it was just performed within the past 3 days. SPEP has been ordered and is pending.  UPEP has been ordered, but the patient is anuric so we are not going to be able to collect this.  The patient's white blood cell count and neutrophil count have been declining during this hospitalization.  Total white blood cell count is down to 1.4 today and ANC is 0.6.  This is likely due to underlying rheumatologic condition and immune dysregulation.  Recommend continued monitoring.  Mikey Bussing,  NP 08/08/2019 10:55 AM   ADDENDUM:  I met with the patient and her husband in her room and we discussed the emerging and still controversial issues of the relationship of scleroderma and cancer which prompted this consult.  There is some increase in the incidence of cancer in general in patients with scleroderma.  This may be the result of assertation bias.  However in one group of scleroderma patients, the ones who have anti-RNA -polymerase-III antibodies, the relationship may be more direct.  In those cases the Link Snuffer is that scleroderma may actually be a paraneoplastic syndrome due to an occult malignancy.  I do not know if those antibodies are present in Ms. Markert as I do not find that they have been drawn either here or at [facility] where she had extensive work-up.  I was not able to order the test our system.  In any case even if she had no specific antibodies she has already had multiple scans at this admission none of which have shown any evidence of cancer and as noted above we will be adding a chest CT and SPEP.  I expect those to be negative.  My recommendation to the patient and her husband is that they screen for what we can screen which includes breast cancer, colon cancer, cervical cancer, and melanoma.  I would not do routine CT scans or routine PET scans in the absence of specific symptoms that require evaluation. Her husband tells me they are already doing "all of those things".  We will follow with you during this admission.  I personally saw this patient and performed a substantive portion of this encounter with the listed APP documented above.   Chauncey Cruel, MD Medical Oncology and Hematology [facility] [address] [place], [place] [address] Tel. [phone]    Fax. [phone]

## 2019-08-08 NOTE — Progress Notes (Signed)
Name: Joanna Ali MRN: [id number] DOB: 1950/11/07    ADMISSION DATE:  07/03/2019 CONSULTATION DATE: 08/08/2019  REFERRING MD : Dr. Barbaraann Cao  CHIEF COMPLAINT: Failure to thrive  BRIEF PATIENT DESCRIPTION: 69-year-old female no acute distress  SIGNIFICANT EVENTS    STUDIES:     HISTORY OF PRESENT ILLNESS:   69 year old female is been hospitalized for 35 days status post thoracentesis x2 PEG tube in place she has a past medical history significant for hypertension, female moderate aortic regurgitation, chronic pericardial effusion, osteoarthritis liver disease with anasarca, macular degeneration.  She is status post bilateral thoracentesis and pericardial window PEG was placed on 07/27/2018. Pulmonary critical care reconsulted on 08/08/2019 for lab values with elevated rheumatoid factor.  The question is could this be interstitial lung disease.   SUBJECTIVE:  Extremely weak and frail elderly female VITAL SIGNS: Temp:  [96.9 F (36.1 C)-99 F (37.2 C)] 97.8 F (36.6 C) (01/19 0717) Pulse Rate:  [81-93] 81 (01/19 0717) Resp:  [14-22] 18 (01/19 0717) BP: (107-135)/(56-95) 117/79 (01/19 0717) SpO2:  [100 %] 100 % (01/19 0717) Weight:  [34 kg] 34 kg (01/19 0500)  PHYSICAL EXAMINATION: General: Frail elderly female who is awake alert but weak Neuro: Essentially intact but extremely weak HEENT: Dysarthric Cardiovascular: Heart sounds are distant Lungs: Diminished throughout Abdomen: PEG in place emaciated Musculoskeletal: Muscle wasting Skin: Warm  Recent Labs  Lab 08/06/19 0253 08/06/19 0253 08/07/19 0854 08/07/19 1417 08/08/19 0327  NA 129*  --  129*  --  133*  K 5.3*  --  6.1*  --  5.3*  CL 90*  --  88*  --  96*  CO2 21*  --  21*  --  20*  BUN 121*  --  165*  --  99*  CREATININE 3.95*  --  5.05*  --  3.40*  GLUCOSE 137*   < > 135* 122* 114*   < > = values in this interval not displayed.   Recent Labs  Lab 08/04/19 0700 08/07/19 0854 08/08/19 0922  HGB 9.2*  9.9* 11.5*  HCT 32.1* 34.6* 40.4  WBC 4.4 2.8* 1.5*  PLT 274 372 243   DG Chest Port 1 View  Result Date: 08/06/2019 CLINICAL DATA:  Pleural effusion. EXAM: PORTABLE CHEST 1 VIEW COMPARISON:  August 01, 2019 FINDINGS: Unchanged dialysis catheter. Persistent enlargement of the cardiac silhouette. Bilateral pleural effusions. Bilateral lower lobe atelectasis versus peribronchial airspace consolidation. Osseous structures are without acute abnormality. Soft tissues are grossly normal. IMPRESSION: 1. Persistent enlargement of the cardiac silhouette. 2. Bilateral pleural effusions. 3. Bilateral lower lobe atelectasis versus peribronchial airspace consolidation. Electronically Signed   By: Fidela Salisbury M.D.   On: 08/06/2019 15:50    ASSESSMENT: Principal Problem:   Anasarca Active Problems:   Pericardial effusion   Hypertension   Elevated troponin   Atypical pneumonia   Hypoxia   Protein-calorie malnutrition, severe   Pressure injury of skin   Failure to thrive in adult   Goals of care, counseling/discussion   Dysphagia   Muscular weakness   Thrush of mouth and esophagus (HCC)   ESRD on hemodialysis ([facility])   Dyspnea   S/P thoracentesis  08/08/2019 pulmonary critical care asked to evaluate again in a patient with significant lung disease but currently on room air 100% O2 pulse oximetry.  He is severely deconditioned this is hospital day 35.  She had thoracentesis performed x2 per pulmonary team with no recent chest x-ray. Pulmonary last signed off 08/01/2019 after patient has been  evaluated by palliative care services.  We are reconsulted 08/08/2019 + rheumatoid factor he was a question of  interstitial lung disease or compartment. Currently she is lying in bed on room air.  She is weakened but able to converse.  No acute distress on room air.      PLAN: Continue current interventions Outpatient follow-up for questionable interstitial lung disease  Richardson Landry Hesham Womac ACNP Acute Care  Nurse Practitioner Maryanna Shape Pulmonary/Critical Care Please consult Amion 08/08/2019, 10:46 AM

## 2019-08-08 NOTE — Progress Notes (Signed)
[facility] Progress Note    Assessment/ Plan:   Pt is a 69 y.o. yo female  with history of HTN,  hepatic steatosis with low albumin, chronic pericardial effusion, moderate AR admitted on 12/17 for progressive dyspnea, anasarca , seen as a consultationfor AKIand fluid overload.  Now felt to have some kind of autoimmune process, started on high dose steroids   #Acute kidney injury,nonoliguric (crt normal in Sept, was 1.43 on 07/03/19) : thought to be hemodynamically mediated, possible HRS concomitant with use of IV contrast- U/A not remarkable.  Autoimmune work-up was unremarkable.   Started HD 07/18/19 -  [facility]DC.  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  Starting CLIP process.  Long way to go for outpt HD.  Tentatively has a spot in [place].  Cont on MWF: sig azotemia so longer Tx: 4h, 2-3L, 2K, Qb 400/800, TDC L IJ  #Hyperkalemia:  Has been having intermittent hyperK.  Try to avoid lokelma as it irritated throat. - now on nepro.   K 5.3 today, monitor.  #Anasarca presumably due to liver disease which was diagnosed at [facility]. She has a history of liver steatosis.No evidence of GN/nephrotic syndrome. 3rd spacing also. volume status much improved but is still an issue- UF as able   #Moderate to large pericardial effusion:  s/p pericardial window on 12/23- TCTS following, stable  #Hypertension: Blood pressure acceptable. if anything is high on low dose Coreg. We need to try and UF more as able . #Anemia: s/p transfusion on 12/22.  gave feraheme and on ESA weekly.  Hb stable in the 9's    #Hyperphosphatemia:  Improved with dialysis.  Not her biggest issue right now-  Given her throat issues have not started binder  **Malnutrition:  S/p G tube 1/8 -  Albumin low   Dispo:    Palliative following,  Trying to obtain rheum c/s inpatient.  Subjective:    No c/o this AM.  HD yesterday 3L UF    Objective:   BP 117/79 (BP  Location: Left Arm)   Pulse 81   Temp 97.8 F (36.6 C) (Oral)   Resp 18   Ht 5' 6"  (1.676 m)   Wt 34 kg   SpO2 100%   BMI 12.11 kg/m   Intake/Output Summary (Last 24 hours) at 08/08/2019 0930 Last data filed at 08/07/2019 2223 Gross per 24 hour  Intake 173 ml  Output 3000 ml  Net -2827 ml   Weight change: -0.016 kg  Physical Exam: Gen: cachectic, inc RR-  NAD CVS: RRR, well-healing scars Resp: normal WOB Abd: thin, mildly painful to palpation Ext: + flank edema ACCESS: L [facility] c/d/i  Imaging: DG Chest Port 1 View  Result Date: 08/06/2019 CLINICAL DATA:  Pleural effusion. EXAM: PORTABLE CHEST 1 VIEW COMPARISON:  August 01, 2019 FINDINGS: Unchanged dialysis catheter. Persistent enlargement of the cardiac silhouette. Bilateral pleural effusions. Bilateral lower lobe atelectasis versus peribronchial airspace consolidation. Osseous structures are without acute abnormality. Soft tissues are grossly normal. IMPRESSION: 1. Persistent enlargement of the cardiac silhouette. 2. Bilateral pleural effusions. 3. Bilateral lower lobe atelectasis versus peribronchial airspace consolidation. Electronically Signed   By: Fidela Salisbury M.D.   On: 08/06/2019 15:50    Labs: [employer] Recent Labs  Lab 08/02/19 0413 08/02/19 0413 08/03/19 0440 08/04/19 0306 08/05/19 0539 08/06/19 0253 08/07/19 0854 08/07/19 1417 08/08/19 0327  NA 132*  --  133* 131* 130* 129* 129*  --  133*  K  6.1*  --  5.3* 5.5* 4.6 5.3* 6.1*  --  5.3*  CL 92*  --  92* 91* 91* 90* 88*  --  96*  CO2 25  --  27 24 24  21* 21*  --  20*  GLUCOSE 124*   < > 148* 140* 149* 137* 135* 122* 114*  BUN 121*  --  98* 129* 79* 121* 165*  --  99*  CREATININE 4.63*  --  4.02* 4.74* 3.17* 3.95* 5.05*  --  3.40*  CALCIUM 8.9  --  8.7* 8.7* 9.1 9.1 9.6  --  9.1  PHOS 7.0*  --  5.3* 7.2* 5.2* 6.3* 6.3*  --  4.6   < > = values in this interval not displayed.   CBC Recent Labs  Lab 08/02/19 0700 08/04/19 0700 08/07/19 0854  WBC 9.3  4.4 2.8*  HGB 9.9* 9.2* 9.9*  HCT 32.1* 32.1* 34.6*  MCV 96.7 99.1 98.3  PLT 285 274 372    Medications:    . amLODipine  10 mg Oral Daily  . carvedilol  3.125 mg Oral BID WC  . Chlorhexidine Gluconate Cloth  6 each Topical Q0600  . collagenase   Topical BID  . darbepoetin (ARANESP) injection - NON-DIALYSIS  100 mcg Subcutaneous Q Sat-1800  . dextrose  1 Tube Oral Once  . diphenhydrAMINE  25 mg Oral Once  . famotidine  10 mg Oral Daily  . feeding supplement (NEPRO CARB STEADY)  1,000 mL Per Tube Q24H  . feeding supplement (PRO-STAT SUGAR FREE 64)  30 mL Per Tube Daily  . fluconazole  200 mg Oral Daily  . gabapentin  100 mg Oral BID  . Gerhardt's butt cream   Topical TID  . heparin  5,000 Units Subcutaneous Q8H  . loratadine  10 mg Oral Daily  . mouth rinse  15 mL Mouth Rinse BID  . methylPREDNISolone (SOLU-MEDROL) injection  50 mg Intravenous Daily  . neomycin-bacitracin-polymyxin   Topical BID  . nystatin  5 mL Mouth/Throat QID  . pantoprazole (PROTONIX) IV  40 mg Intravenous Q12H  . sertraline  25 mg Oral BID  . sodium chloride flush  3 mL Intravenous Q12H  . sodium chloride irrigation  15 mL Irrigation QID      Rexene Agent   [facility]  08/08/2019, 9:30 AM

## 2019-08-08 NOTE — Progress Notes (Addendum)
PROGRESS NOTE    Joanna Ali  ZOX:[id number] DOB: 1951/03/18 DOA: 07/03/2019 PCP: Renaldo Reel, DO   Brief Narrative: 69 year old with past medical history significant for hypertension, chronic pericardial effusion, moderate aortic valve regurgitation, former smoker, alcohol use.  Initially admitted on 12/17 for progressive shortness of breath with anasarca and fatigue.  Previously worked up by [facility] rheumatology with negative work-up including ESR, lupus anticoagulant, rheumatoid factor, TSH, ANCA, ANA and light free chain studies.  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  Since that time patient had stopped drinking alcohol as advised.  She had been on diuretics and spironolactone without any improvement in symptoms.  CT angiogram of the chest on 12/15 noted moderate pericardial effusion and airspace disease.  She underwent thoracentesis by PCCM which removed 700 cc of fluid.  Patient had undergone right heart cath on 12/17 and underwent pericardial window on 12/23.  Attempts were made with IV diuretics but patient developed worsening kidney function with creatinine up to 5.  Nephrology was formally consulted and patient was a started on dialysis.  Patient has been having poor oral intake and she had a core track place. Since last medicine consult note patient has a PEG for persistent poor oral intake, per husband oral pain started after NG tube placement., on hemodialysis for AKI with rapid progression to end-stage renal disease, severe anasarca and pericardial effusion status post window.  She has been recently started on empiric steroids, which will be wean off.   During my evaluation, repeated labs; RF positive at 71,  Anti RNA Polymerase antibody positive 146 concern for scleroderma, patient has some thickening of the skin, and renal failure  and pericardial evolvement. Pulmonology consulted to evaluate for ILD. I discussed case with rheumatology [facility], who recommend supportive care, for reflux treated with PPI, joint pain Voltaren gel and Tylenol, proceed with prednisone taper. For Reynold phenomenon calcium channel blocker. Patient should have evaluation for underlying malignancy when stable. If patient has interstitial lung disease, then she should  be evaluate for immunotherapy, can be out patient when stronguer. She wouldnt recommend immunotherapy for just joint pain and a skin manifestation. Patient has already developed renal failure requiring hemodialysis.  Patient developed leukopenia, oncology has been consulted.   Assessment & Plan:   Principal Problem:   Anasarca Active Problems:   Pericardial effusion   Hypertension   Elevated troponin   Atypical pneumonia   Hypoxia   Protein-calorie malnutrition, severe   Pressure injury of skin   Failure to thrive in adult   Goals of care, counseling/discussion   Dysphagia   Muscular weakness   Thrush of mouth and esophagus (HCC)   ESRD on hemodialysis (HCC)   Dyspnea   S/P thoracentesis  1-Patient presented with Anasarca; FTT, Joint pains, pericardial effusion, develops renal failure, Reynold phenomenon. Now with positive Polymerase antibody positive III. Concern with scleroderma.  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  -Started on IV steroid to treat empirically in case Rheumatology etiology.  -Patient had CT abdomen plevis, CT chest back in September negative for lymphadenopathy or mass.  -Rheumatology evaluation September 2020.: CRP 13, ESR 25, ANA negative, CANCA,, PANCA negative, Sclerodermas cl 70 negative, SSS (ro) negative, electrophoresis protein; M spike negative. HIV negative. -Urine and blood Histamine level. Histamine 0.283 NL. - Double strand DNA Negative. ANA negative, RF 71.  -TSH mildly  elevated, Free T 3 and T 4  normal.  -Thiamine level Pending, Anticetromere antibody 0.2 negative, Anti RNA Polymerase antibody positive at 146.  Heavy metal blood pending.  -RF at 71, received  IV solumedrol to 125 mg day 3, then 50 mg daily. Discussed with Rheumatology from [facility], recommend quick taper, now that we are considering scleroderma.  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 Marland Kitchen Repeated SCL 70 negative.  anticentromere antibody negative.  -She had abnormal level less than 0.1 M spike on lab work done at Dr Trudie Reed office.repeated SPEP and UPEP. Of note SPEP on September was negative,.  -[facility]'t have bed available on 08-08-2019. [facility] didn't have bed available today (08-08-2019). They recommend [place] to call again 1-20.  2-ESRD new on HD;  Progressive AKI, found to be exacerbated by contrast-induced nephropathy. Started on hemodialysis, with improvement of anasarca. Out patient HD has been set up.   3-Acute AMS, Lethargic, Encephalopathy; FTT Patient became lethargic in HD, 1-13 Likely related to medication ativan. She got a dose during HD>  Will limit sedatives. Stop fentanyl, decrease opioids. Stop ativan.  ABG PCO2 not elevated. CT head Negative for acute abnormalities.  Ammonia 29, Thiamine level pending.  IV thiamine 250 mg TID for 3 days.  She has become more alert.   4-Acute Hypoxic Respiratory Failure; Pulmonary edema, pleural effusion; -She was started on IV solumedrol by palliative for presume autoimmune process ?. -Worsening Dyspnea, chest x ray 1-12; worsening pleural effusion. Underwent repeated Thoracentesis 1-12; 1.1 L Left  and 500 cc from right.  -Chest x ray with Bilateral pleural effusion. Patient is on RA. Will need to repeat chest x ray in few days.  -Pleural cytology reactive mesothelia cell. Culture no growth.  -CCM consulted for evaluation of ILD.    3-Pericardial effusion with elevated troponin;  -Status post pericardial window on 12/23, under care of cardiology and CT surgery.   -She had a pericardial effusion as well as pleural effusion that are thought to be stable at this point in time. -pathology no malignancy, chronic inflammation. AFB negative.  -Fungus culture pending.   4-Hyperkalemia;  Had HD today.    5-Failure to Thrive, Severe malnutrition;  -Continue with tube feeding. -speech eval.   6-Atypical PNA with Hypoxia; Completed treatment.   7-Dysphagia Thrush;  On Diflucan.  She had strep A culture negative.  On tube feeding.  Pepcid.  Per husband patient develops odynophagia after she had NG tube place.   8-Stage 2 pressure Ulcer;  Continue with local care.   9-Anxiety;  Continue with Zoloft.  Avoid sedatives, became very lethargic during HD 1-13 after 1 mg Ativan,. She has been less anxious.   10-Back pain;  Continue with fentanyl PRN   11-HTN;  Continue with coreg,  norvasc.   Hypernatremia; corrected with HD.   Abdominal pain;  Started  PPI  CT abdomen; negative for acute abdominal pathology. report improvement of pain.   Hypoglycemia; suspect is not accurate due to poor perfusion of fingers. Venous blood sampler normal yesterday.    Pressure Injury 07/19/19 Sacrum Posterior;Mid Stage 2 -  Partial thickness loss of dermis presenting as a shallow open injury with a red, pink wound bed without slough. (Active)  07/19/19 0600  Location: Sacrum  Location Orientation: Posterior;Mid  Staging: Stage 2 -  Partial thickness loss of dermis presenting as a shallow open injury with a red, pink wound  bed without slough.  Wound Description (Comments):   Present on Admission: No     Pressure Injury 07/22/19 Nare Right;Left Stage 2 -  Partial thickness loss of dermis presenting as a shallow open injury with a red, pink wound bed without slough. Injury r/t cortrak (Active)  07/22/19 1000  Location: Nare   Location Orientation: Right;Left  Staging: Stage 2 -  Partial thickness loss of dermis presenting as a shallow open injury with a red, pink wound bed without slough.  Wound Description (Comments): Injury r/t cortrak  Present on Admission: No     Nutrition Problem: Severe Malnutrition Etiology: chronic illness(pericardial effusion w/ volume overload)    Signs/Symptoms: moderate fat depletion, severe muscle depletion, percent weight loss    Interventions: Ensure Enlive (each supplement provides 350kcal and 20 grams of protein), Tube feeding  Estimated body mass index is 12.11 kg/m as calculated from the following:   Height as of this encounter: 5' 6"  (1.676 m).   Weight as of this encounter: 34 kg.   DVT prophylaxis: Heparin Code Status: full code Family Communication: care discussed with Husband 1-18 Disposition Plan: PT evaluation to see if patient qualify for CIR, awaiting result CCP, SPEP, scleroderma evaluation. trying to transfer patient to [facility] center. No beds available.   Consultants:   Cardiology  CCM  CVTS  Palliative  Nephrology  Procedures:   HD  Pericardial window  Antimicrobials:  None currently   Subjective: She was alert during my evaluation in the morning, she report knee pain some times. She is breathing ok.  She was able to sleep last night.   Objective: Vitals:   08/08/19 0320 08/08/19 0500 08/08/19 0626 08/08/19 0717  BP: 118/77  119/79 117/79  Pulse: 81  85 81  Resp: 14   18  Temp: (!) 97.5 F (36.4 C)   97.8 F (36.6 C)  TempSrc: Axillary   Oral  SpO2: 100%   100%  Weight:  34 kg    Height:        Intake/Output Summary (Last 24 hours) at 08/08/2019 0747 Last data filed at 08/07/2019 2223 Gross per 24 hour  Intake 173 ml  Output 3000 ml  Net -2827 ml   Filed Weights   08/07/19 0313 08/07/19 0817 08/08/19 0500  Weight: 39.9 kg 39.9 kg 34 kg    Examination:  General exam: chronic ill appearing Respiratory  system: Normal respiratory effort.  Cardiovascular system: S 1, S 2 RRR Gastrointestinal system: BS present, soft, peg tube in place Central nervous system: alert.  Extremities; trace edema, skin is hard and thick specially LE,  reynold phenomenon hands.    Data Reviewed: I have personally reviewed following labs and imaging studies  CBC: Recent Labs  Lab 08/02/19 0700 08/04/19 0700 08/07/19 0854  WBC 9.3 4.4 2.8*  HGB 9.9* 9.2* 9.9*  HCT 32.1* 32.1* 34.6*  MCV 96.7 99.1 98.3  PLT 285 274 161   Basic Metabolic Panel: Recent Labs  Lab 08/04/19 0306 08/04/19 0306 08/05/19 0539 08/06/19 0253 08/07/19 0854 08/07/19 1417 08/08/19 0327  NA 131*  --  130* 129* 129*  --  133*  K 5.5*  --  4.6 5.3* 6.1*  --  5.3*  CL 91*  --  91* 90* 88*  --  96*  CO2 24  --  24 21* 21*  --  20*  GLUCOSE 140*   < > 149* 137* 135* 122* 114*  BUN 129*  --  79* 121* 165*  --  99*  CREATININE 4.74*  --  3.17* 3.95* 5.05*  --  3.40*  CALCIUM 8.7*  --  9.1 9.1 9.6  --  9.1  PHOS 7.2*  --  5.2* 6.3* 6.3*  --  4.6   < > = values in this interval not displayed.   GFR: Estimated Creatinine Clearance: 8.3 mL/min (A) (by C-G formula based on SCr of 3.4 mg/dL (H)). Liver Function Tests: Recent Labs  Lab 08/04/19 0306 08/05/19 0539 08/06/19 0253 08/07/19 0854 08/08/19 0327  ALBUMIN 2.5* 3.2* 3.1* 3.0* 3.3*   No results for input(s): LIPASE, AMYLASE in the last 168 hours. Recent Labs  Lab 08/04/19 1158  AMMONIA 29   Coagulation Profile: No results for input(s): INR, PROTIME in the last 168 hours. Cardiac Enzymes: No results for input(s): CKTOTAL, CKMB, CKMBINDEX, TROPONINI in the last 168 hours. BNP (last 3 results) No results for input(s): PROBNP in the last 8760 hours. HbA1C: No results for input(s): HGBA1C in the last 72 hours. CBG: Recent Labs  Lab 08/07/19 1621 08/07/19 1919 08/07/19 2325 08/08/19 0319 08/08/19 0728  GLUCAP 87 84 111* 77 75   Lipid Profile: No results for  input(s): CHOL, HDL, LDLCALC, TRIG, CHOLHDL, LDLDIRECT in the last 72 hours. Thyroid Function Tests: No results for input(s): TSH, T4TOTAL, FREET4, T3FREE, THYROIDAB in the last 72 hours. Anemia Panel: No results for input(s): VITAMINB12, FOLATE, FERRITIN, TIBC, IRON, RETICCTPCT in the last 72 hours. Sepsis Labs: No results for input(s): PROCALCITON, LATICACIDVEN in the last 168 hours.  No results found for this or any previous visit (from the past 240 hour(s)).       Radiology Studies: DG Chest Port 1 View  Result Date: 08/06/2019 CLINICAL DATA:  Pleural effusion. EXAM: PORTABLE CHEST 1 VIEW COMPARISON:  August 01, 2019 FINDINGS: Unchanged dialysis catheter. Persistent enlargement of the cardiac silhouette. Bilateral pleural effusions. Bilateral lower lobe atelectasis versus peribronchial airspace consolidation. Osseous structures are without acute abnormality. Soft tissues are grossly normal. IMPRESSION: 1. Persistent enlargement of the cardiac silhouette. 2. Bilateral pleural effusions. 3. Bilateral lower lobe atelectasis versus peribronchial airspace consolidation. Electronically Signed   By: Fidela Salisbury M.D.   On: 08/06/2019 15:50        Scheduled Meds: . amLODipine  10 mg Oral Daily  . carvedilol  3.125 mg Oral BID WC  . Chlorhexidine Gluconate Cloth  6 each Topical Q0600  . collagenase   Topical BID  . darbepoetin (ARANESP) injection - NON-DIALYSIS  100 mcg Subcutaneous Q Sat-1800  . dextrose  1 Tube Oral Once  . diphenhydrAMINE  25 mg Oral Once  . famotidine  10 mg Oral Daily  . feeding supplement (NEPRO CARB STEADY)  1,000 mL Per Tube Q24H  . feeding supplement (PRO-STAT SUGAR FREE 64)  30 mL Per Tube Daily  . fluconazole  200 mg Oral Daily  . gabapentin  100 mg Oral BID  . Gerhardt's butt cream   Topical TID  . heparin  5,000 Units Subcutaneous Q8H  . loratadine  10 mg Oral Daily  . mouth rinse  15 mL Mouth Rinse BID  . methylPREDNISolone (SOLU-MEDROL)  injection  50 mg Intravenous Daily  . neomycin-bacitracin-polymyxin   Topical BID  . nystatin  5 mL Mouth/Throat QID  . pantoprazole (PROTONIX) IV  40 mg Intravenous Q12H  . sertraline  25 mg Oral BID  . sodium chloride flush  3 mL Intravenous Q12H  . sodium chloride irrigation  15 mL Irrigation QID   Continuous Infusions: .  sodium chloride    . sodium chloride    . sodium chloride       LOS: 35 days    Time spent: 35 minutes    Altagracia Rone A Lashandra Arauz, MD [facility]   If 7PM-7AM, please contact night-coverage [email or web address] Password [facility] 08/08/2019, 7:47 AM

## 2019-08-08 NOTE — Progress Notes (Signed)
Physical Therapy Treatment Patient Details Name: Joanna Ali MRN: [id number] DOB: March 18, 1951 Today's Date: 08/08/2019    History of Present Illness Pt is a 69 y.o. female admitted 07/03/19 with anasarca, fatigue and tachycardia. 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 Pt with pericardial effusion s/p pericardial window. PEG tube placed. PMH includes former tobacco and alcohol abuse, HTN, chronic pleural effusion.    PT Comments    Patient progressing slowly towards PT goals. More alert and awake today upon arrival with pt sitting on BSC. Requires Mod A to stand from [facility] x2 and total A for pericare. Limited by pain in back and throat as well as low blood sugar today. Also reporting nausea after standing twice. Tolerated some there ex sitting in chair. Answered husband's questions regarding exercises/stretches he can do with pt to help with posture and strengthening her back/neck. Gave suggested exercises/stretches and encouraged to continue having pt sit EOB as much as tolerated. VSS throughout. Will follow.     Follow Up Recommendations  SNF;Supervision/Assistance - 24 hour     Equipment Recommendations  Rolling walker with 5" wheels    Recommendations for Other Services       Precautions / Restrictions Precautions Precautions: Fall Precaution Comments: PEG tube Restrictions Weight Bearing Restrictions: No    Mobility  Bed Mobility               General bed mobility comments: Sitting on bSC upon PT arrival.  Transfers Overall transfer level: Needs assistance Equipment used: 1 person hand held assist Transfers: Sit to/from Stand Sit to Stand: Mod assist         General transfer comment: Mod A to power to standing from [facility] x2 with increased time to get the energy to go, assist with hip extension and for upright/balance. Brought chair behind pt on second stand. Declined taking steps today due to SOB, pain and low blood  sugar.  Ambulation/Gait             General Gait Details: Unable to progress this session   Stairs             Wheelchair Mobility    Modified Rankin (Stroke Patients Only)       Balance Overall balance assessment: Needs assistance Sitting-balance support: Feet unsupported;No upper extremity supported Sitting balance-Leahy Scale: Fair Sitting balance - Comments: Min guard assist.   Standing balance support: During functional activity Standing balance-Leahy Scale: Poor Standing balance comment: Requires UE support and external support for standing balance. Limited tolerance due to pain/weakness. Total A for pericare.                            Cognition Arousal/Alertness: Awake/alert Behavior During Therapy: Flat affect Overall Cognitive Status: Impaired/Different from baseline Area of Impairment: Attention;Safety/judgement;Problem solving                   Current Attention Level: Sustained   Following Commands: Follows one step commands with increased time Safety/Judgement: Decreased awareness of safety;Decreased awareness of deficits Awareness: Intellectual;Emergent Problem Solving: Difficulty sequencing;Decreased initiation;Requires verbal cues;Requires tactile cues;Slow processing General Comments: Distracted by pain in throat and back today. Eyes open and alert during session.      Exercises General Exercises - Lower Extremity Long Arc Quad: Both;5 reps;Seated Hip Flexion/Marching: AROM;Both;5 reps;Seated    General Comments General comments (skin integrity, edema, etc.): Spouse present during session, VSS. Discussion with spouse re: exercises he can  do with her to assist with strengthening back and neck musculature and work on postural muscles. Instructed pt in exercises to do sitting EOB- scapular retraction, MET with cervical spine into extension, UE AROM to open up chest and pectoral stretch. Pt appreciated discussion and  suggestions.      Pertinent Vitals/Pain Pain Assessment: Faces Faces Pain Scale: Hurts whole lot Pain Location: throat, back with movement Pain Descriptors / Indicators: Discomfort;Moaning;Grimacing;Guarding;Sore Pain Intervention(s): Repositioned;Monitored during session;Limited activity within patient's tolerance    Home Living                      Prior Function            PT Goals (current goals can now be found in the care plan section) Progress towards PT goals: Progressing toward goals(slowly)    Frequency    Min 2X/week      PT Plan Current plan remains appropriate    Co-evaluation              AM-PAC PT "6 Clicks" Mobility   Outcome Measure  Help needed turning from your back to your side while in a flat bed without using bedrails?: A Lot Help needed moving from lying on your back to sitting on the side of a flat bed without using bedrails?: A Lot Help needed moving to and from a bed to a chair (including a wheelchair)?: A Lot Help needed standing up from a chair using your arms (e.g., wheelchair or bedside chair)?: A Lot Help needed to walk in hospital room?: Total Help needed climbing 3-5 steps with a railing? : Total 6 Click Score: 10    End of Session Equipment Utilized During Treatment: Gait belt Activity Tolerance: Patient limited by pain;Other (comment)(low blood sugar, RN present) Patient left: in chair;with call bell/phone within reach;with family/visitor present;with nursing/sitter in room Nurse Communication: Mobility status PT Visit Diagnosis: Unsteadiness on feet (R26.81);Muscle weakness (generalized) (M62.81);Difficulty in walking, not elsewhere classified (R26.2);Pain Pain - part of body: (throat, back)     Time: 4619-0122 PT Time Calculation (min) (ACUTE ONLY): 18 min  Charges:  $Therapeutic Activity: 8-22 mins                     Joanna Ali, PT, DPT Acute Rehabilitation Services Pager [phone] Office  [phone]       [name] 08/08/2019, 1:53 PM

## 2019-08-08 NOTE — Progress Notes (Signed)
CRITICAL VALUE ALERT  Critical Value:  WBC 1.4 Date & Time Notied:  08/08/2019 & 1135  Provider Notified: Dr. Tyrell Antonio  Orders Received/Actions taken: Awaiting orders.

## 2019-08-09 DIAGNOSIS — D72829 Elevated white blood cell count, unspecified: Secondary | ICD-10-CM

## 2019-08-09 LAB — GLUCOSE, CAPILLARY
Glucose-Capillary: 94 mg/dL (ref 70–99)
Glucose-Capillary: 97 mg/dL (ref 70–99)
Glucose-Capillary: 114 mg/dL — ABNORMAL HIGH (ref 70–99)
Glucose-Capillary: 89 mg/dL (ref 70–99)

## 2019-08-09 LAB — RENAL FUNCTION PANEL
Albumin: 3 g/dL — ABNORMAL LOW (ref 3.5–5.0)
Anion gap: 18 — ABNORMAL HIGH (ref 5–15)
BUN: 132 mg/dL — ABNORMAL HIGH (ref 8–23)
CO2: 19 mmol/L — ABNORMAL LOW (ref 22–32)
Calcium: 9 mg/dL (ref 8.9–10.3)
Chloride: 92 mmol/L — ABNORMAL LOW (ref 98–111)
Creatinine, Ser: 4.12 mg/dL — ABNORMAL HIGH (ref 0.44–1.00)
GFR calc Af Amer: 12 mL/min — ABNORMAL LOW (ref >60)
GFR calc non Af Amer: 10 mL/min — ABNORMAL LOW (ref >60)
Glucose, Bld: 121 mg/dL — ABNORMAL HIGH (ref 70–99)
Phosphorus: 5.2 mg/dL — ABNORMAL HIGH (ref 2.5–4.6)
Potassium: 5.4 mmol/L — ABNORMAL HIGH (ref 3.5–5.1)
Sodium: 129 mmol/L — ABNORMAL LOW (ref 135–145)

## 2019-08-09 LAB — CBC
HCT: 34.8 % — ABNORMAL LOW (ref 36.0–46.0)
Hemoglobin: 10.5 g/dL — ABNORMAL LOW (ref 12.0–15.0)
MCH: 29 pg (ref 26.0–34.0)
MCHC: 30.2 g/dL (ref 30.0–36.0)
MCV: 96.1 fL (ref 80.0–100.0)
Platelets: 301 10*3/uL (ref 150–400)
RBC: 3.62 MIL/uL — ABNORMAL LOW (ref 3.87–5.11)
RDW: 20.9 % — ABNORMAL HIGH (ref 11.5–15.5)
WBC: 1 10*3/uL — CL (ref 4.0–10.5)
nRBC: 4.8 % — ABNORMAL HIGH (ref 0.0–0.2)

## 2019-08-09 LAB — PATHOLOGIST SMEAR REVIEW

## 2019-08-09 LAB — C4 COMPLEMENT: Complement C4, Body Fluid: 13 mg/dL (ref 12–38)

## 2019-08-09 LAB — C3 COMPLEMENT: C3 Complement: 73 mg/dL — ABNORMAL LOW (ref 82–167)

## 2019-08-09 MED ORDER — NEPRO/CARBSTEADY PO LIQD
1000.0000 mL | ORAL | Status: DC
  Administered 2019-08-09 – 2019-08-22 (×9): 1000 mL
  Filled 2019-08-09 (×18): qty 1000

## 2019-08-09 MED ORDER — ACETAMINOPHEN 160 MG/5ML PO SOLN
500.0000 mg | Freq: Four times a day (QID) | ORAL | Status: DC | PRN
  Administered 2019-08-16: 500 mg via ORAL
  Filled 2019-08-09: qty 20.3

## 2019-08-09 MED ORDER — FENTANYL CITRATE (PF) 100 MCG/2ML IJ SOLN
INTRAMUSCULAR | Status: AC
  Administered 2019-08-09: 09:00:00 25 ug via INTRAVENOUS
  Filled 2019-08-09: qty 2

## 2019-08-09 MED ORDER — THIAMINE HCL 100 MG PO TABS
100.0000 mg | ORAL_TABLET | Freq: Every day | ORAL | Status: DC
  Administered 2019-08-09 – 2019-08-23 (×14): 100 mg via ORAL
  Filled 2019-08-09 (×15): qty 1

## 2019-08-09 MED ORDER — HEPARIN SODIUM (PORCINE) 1000 UNIT/ML IJ SOLN
INTRAMUSCULAR | Status: AC
  Filled 2019-08-09: qty 4

## 2019-08-09 MED ORDER — ENSURE ENLIVE PO LIQD
237.0000 mL | Freq: Two times a day (BID) | ORAL | Status: DC
  Filled 2019-08-09: qty 237

## 2019-08-09 MED ORDER — PREDNISONE 50 MG PO TABS
60.0000 mg | ORAL_TABLET | Freq: Every day | ORAL | Status: DC
  Administered 2019-08-10: 60 mg via ORAL
  Filled 2019-08-09: qty 1

## 2019-08-09 MED ORDER — PROMETHAZINE HCL 25 MG/ML IJ SOLN
6.2500 mg | Freq: Once | INTRAMUSCULAR | Status: AC
  Administered 2019-08-09: 19:00:00 6.25 mg via INTRAVENOUS
  Filled 2019-08-09: qty 1

## 2019-08-09 MED ORDER — ALPRAZOLAM 0.25 MG PO TABS
0.2500 mg | ORAL_TABLET | Freq: Three times a day (TID) | ORAL | Status: DC | PRN
  Administered 2019-08-09 – 2019-08-22 (×13): 0.25 mg via ORAL
  Filled 2019-08-09 (×13): qty 1

## 2019-08-09 MED ORDER — SODIUM CHLORIDE 0.9 % IV SOLN
8.0000 mg | Freq: Four times a day (QID) | INTRAVENOUS | Status: DC | PRN
  Administered 2019-08-14 – 2019-08-15 (×2): 8 mg via INTRAVENOUS
  Filled 2019-08-09 (×4): qty 4

## 2019-08-09 NOTE — Progress Notes (Signed)
Nutrition Follow-up  DOCUMENTATION CODES:   Severe malnutrition in context of chronic illness, Underweight  INTERVENTION:   Continue tube feeding via G-tube: - IncreaseNeproto 45 ml/hr (1026m/day) - Pro-stat 30 ml daily - Free water per MD  Tube feeding regimen provides2044kcal, 102grams of protein, and 7829mof H2O, meets 100% of needs.  - Encourage adequate PO intake and provide feeding assistance as needed  - Ensure Enlive po BID, each supplement provides 350 kcal and 20 grams of protein  NUTRITION DIAGNOSIS:   Severe Malnutrition related to chronic illness (pericardial effusion w/ volume overload) as evidenced by moderate fat depletion, severe muscle depletion, percent weight loss.  Ongoing, being addressed via TF  GOAL:   Patient will meet greater than or equal to 90% of their needs  Met via TF  MONITOR:   PO intake, Supplement acceptance, Labs, TF tolerance, Weight trends, I & O's  REASON FOR ASSESSMENT:   Consult Assessment of nutrition requirement/status  ASSESSMENT:   Patient with PMH significant for chronic pericardial effusion, moderate AVR, HTN, hepatic steatosis, and peripheral neuropathy. Presents this admission with anasarca and bilateral pleural effusions.  12/15 - s/p bilateral thoracentesiswith700 ml fluid removed 12/17 - s/p cardiac cath 12/23 - s/p VATS, pericardial window 12/30 - Cortrak placed, tip in stomach 01/07 - TF switched to Nepro due to persistent hyperkalemia, Cortrak removed 01/08 - s/p G-tube placement 01/09 - steroids initiated 01/12 - s/p bilateral thoracentesis with 1600 ml total fluid removed  Pt restarted on high-dose steroids. 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 Plan is to obtain rheumatology consult inpatient vs transfer to tertiary care  center.  Pt in HD this AM. Last HD was on 08/07/19 with 3000 ml net UF.  Current weight of 100 lbs is 14 lbs below admission weight. Unsure of pt's dry weight at this time. RD to increase rate of TF to provide additional kcal to prevent dry weight loss. Discussed with RN.  Per Rn edema assessment on 1/19, pt with mild pitting generalized edema and mild pitting edema to BLE.  Per SLP note, pt grimacing with swallowing of various textures, both solid and liquid. Suspect thrush is continuing to cause pain.  Current TF: Nepro @ 40 ml/hr, Pro-stat 30 ml daily via PEG  Meal Completion: 0-20% x last 8 recorded meals  Medications reviewed and include: pepcid, fluconazole, nystatin, protonix, prednisone, thiamine  Labs reviewed: sodium 129, potassium 5.4, chloride 92, phosphorus 5.2, WBC 1.0 CBG's: 52-94 x 24 hours  Diet Order:   Diet Order            Diet regular Room service appropriate? Yes; Fluid consistency: Thin; Fluid restriction: 1500 mL Fluid  Diet effective now              EDUCATION NEEDS:   Education needs have been addressed  Skin:  Skin Assessment: Skin Integrity Issues: Stage II: sacrum, bilateral nares Incisions: chest, left neck Other: cracking/dryness to BLE  Last BM:  08/08/19  Height:   Ht Readings from Last 1 Encounters:  07/04/19 _0  (1.676 m)    Weight:   Wt Readings from Last 1 Encounters:  08/09/19 45.4 kg    Ideal Body Weight:  59.1 kg  BMI:  Body mass index is 16.14 kg/m.  Estimated Nutritional Needs:   Kcal:  1800-2000  Protein:  90-105 grams  Fluid:  1000 ml + UOP  Kate Jablonski Vernetta Dizdarevic, MS, RD, LDN Inpatient Clinical Dietitian Pager: [phone] Weekend/After Hours: [phone]  

## 2019-08-09 NOTE — Progress Notes (Addendum)
Patient sitting on side of bed. Husband at bedside. Patient is looking down and spitting into emesis bag. Husband reports that she is still nauseated. Will continue to observe. Residual checked. No residual noted.   2000- Assisted patient on left side with pillows. Patient resting with eyes closed.    2015- Patient assisted back to side of bed. Continues to c/o nausea. Continuously says "help me, help me" (referring to positioning at this time) Patient bed alarm on medium setting. Patient asked if she needs nurse which button to push and she points to red button on call light. Verbalizes understanding of not to get up.   2020- Assisted with repositioning back in bed on left side. Nepro on hold at this time to assess if n/v ceases. Will continue to observe. Was at goal of 54m/hr.  2107- Continues to c/o nausea. Assisted back to side of bed. Bed alarm in place.    2306- Patient is resting with eyes closed at this time. HOB up at 30 degrees, Nepro restarted at 446mhr. No distress at this time. Will continue to observe. Turn q 2 hours.

## 2019-08-09 NOTE — Progress Notes (Signed)
[facility]  PROGRESS NOTE  Joanna Ali DJS:[id number] DOB: 1950-10-09 DOA: 07/03/2019 PCP: Renaldo Reel, DO   Brief HPI:   69 year old female with a history of hypertension, chronic pericardial effusion, moderate aortic valve regurgitation, former smoker, alcohol abuse initially admitted on 07/06/2019 for progressive shortness of breath with anasarca and fatigue.  She was previously worked up by [facility] rheumatology with negative work-up including ESR, lupus anticoagulant, rheumatoid factor, TSH, ANCA, ANA, light free chain studies.  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  Since that time patient had stopped drinking alcohol as advised.  She has been on diuretics and spironolactone without any improvement in symptoms.  CT of the chest on 07/04/2019 noted moderate pericardial effusion and airspace disease.  She underwent thoracentesis by PCCM which removed 700 mL of the fluid.  Patient underwent right heart cath on 07/06/2019 and pericardial window on 07/12/2019.  Attempts were made to diurese with IV diuretics but patient with worsening kidney function with creatinine up to 5.  Nephrology was formally consulted patient was started on dialysis.  She has been having poor p.o. intake so code tract was placed.  She was then later placed on PEG tube for persistent poor p.o. intake, also patient developed oral pain after NG tube placement.  She is on hemodialysis for AKI with rapid progression to end-stage renal disease, severe anasarca, pericardial effusion, status post pericardial window.  She was recently started on empiric steroids which will be weaned off.  Her labs showed RF positive at 71, INT RNA polymerase antibody +146 concern for scleroderma also she had some thickening of the skin with renal failure and pericardial involvement.  Pulmonary  was consulted to evaluate for ILD.  Dr. Regular diet discussed with rheumatology at [facility] who recommended supportive care, acid reflux treated with PPI, joint pain Voltaren gel and Tylenol.  Prednisone taper.  For Raynaud's phenomenon calcium channel blocker.  If patient has interstitial lung disease then she should be evaluated for immunotherapy can be as outpatient when stronger.  Rheumatology did not recommend immunotherapy for just joint pain and skin manifestation.    Subjective   Patient seen and examined, denies chest pain or shortness of breath.  Feels fatigued.   Assessment/Plan:     1. ?  Scleroderma /anasarca/joint pain/pericardial effusion-patient presented with anasarca, failure to thrive, joint pains, pericardial effusion, Raynaud's phenomenon.  She had positive anti-RNA polymerase 3 antibody.  She was started on IV steroids to treat empirically for rheumatologic etiology.  Rheumatology evaluation in September 2020 showed CRP 13, ESR 25, ANA negative, c-ANCA/p-ANCA negative, scleroderma SCL 70 -, SSS (ro) negative, M spike negative electrophoresis, HIV negative.  Double-stranded DNA negative, ANA negative, RF 71.  Patient received IV Solu-Medrol 125 mg for 3 days then changed to 50 mg p.o. daily.  Rheumatology at [facility] recommended quick taper as we were considering scleroderma.  [facility] do not have bed availability at this time.  2. Evaluation for malignancy- Patient developed leukopenia, oncology was consulted.  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 Jana Hakim saw the patient and does not recommend further search for malignancy.  As patient had extensive imaging this admission with no malignancy identified, CT chest noncontrast yesterday was negative, CT abdomen/pelvis on 1/16 was unremarkable and repeat SPEP showed no monoclonal protein with normal kappa lambda ratio.  She also had negative  pleural fluid cytology x4.  Screening malignancy can  be done as  Outpatient.  3. ESRD, new on HD-patient has progressively AKI found to be exacerbated by contrast-induced nephropathy.  She was started on hemodialysis with improvement of anasarca.  Outpatient dialysis has been set up.  4. Lethargy/encephalopathy-resolved, patient became lethargic and hemodialysis on 08/02/2019 she was receiving Ativan at that time.  Fentanyl was stopped, opiates were decreased.  Ammonia level 29, she received IV thiamine 250 mg 3 times daily for 3 days.  Thiamine level is currently pending.  5. Acute hypoxic respiratory failure/pulmonary edema/pleural effusion-she was started on IV Solu-Medrol by palliative care for presumed autoimmune process.  She had worsening dyspnea with pleural effusion, she underwent repeat thoracentesis on 08/01/2019.  Removed 1.1 L from left and 500 mL from right.  Pleural cytology showed reactive mesothelial cells.  Pleural fluid culture showed no growth.  PCCM was consulted for possible ILD.  6. Pericardial effusion with elevated troponin-patient is status post pericardial window on 07/12/2019.  Pathology showed no malignancy, chronic inflammation, AFP negative.  Fungal culture is pending.  7. Atypical pneumonia with hypoxia-completed treatment.  8. Severe malnutrition/failure to thrive-continue with tube feeding.  Speech therapy consulted, and patient started on regular diet.  9. Dysphagia/thrush-group A strep was negative on 07/28/2019, patient currently on oral nystatin, oral Diflucan.  10. Anxiety-continue Zoloft, started on Xanax today for manic attack.  Will start low-dose 0.25 mg 3 times daily as needed.  11. Back pain-continue with oxycodone as needed, Voltaren gel twice a day.  12. Hypertension-continue Coreg, Norvasc.    SpO2: 100 % O2 Flow Rate (L/min): 2 L/min     Lab Results  Component Value Date   SARSCOV2NAA NEGATIVE 07/04/2019   [place] NEGATIVE 07/03/2019   [place] NEGATIVE 04/04/2019   SARSCOV2NAA NOT  DETECTED 03/25/2019     CBG: Recent Labs  Lab 08/08/19 1600 08/08/19 1625 08/08/19 1920 08/08/19 2313 08/09/19 0331  GLUCAP 66* 83 89 85 94    CBC: Recent Labs  Lab 08/04/19 0700 08/07/19 0854 08/08/19 0922 08/08/19 1052 08/09/19 0722  WBC 4.4 2.8* 1.5* 1.4* 1.0*  NEUTROABS  --   --   --  0.6*  --   HGB 9.2* 9.9* 11.5* 10.9* 10.5*  HCT 32.1* 34.6* 40.4 36.8 34.8*  MCV 99.1 98.3 99.8 96.1 96.1  PLT 274 372 243 291 528    Basic Metabolic Panel: Recent Labs  Lab 08/05/19 0539 08/05/19 0539 08/06/19 0253 08/06/19 0253 08/07/19 0854 08/07/19 1417 08/08/19 0327 08/08/19 1842 08/09/19 0104  NA 130*   < > 129*  --  129*  --  133* 132* 129*  K 4.6   < > 5.3*  --  6.1*  --  5.3* 5.0 5.4*  CL 91*   < > 90*  --  88*  --  96* 95* 92*  CO2 24   < > 21*  --  21*  --  20* 21* 19*  GLUCOSE 149*   < > 137*   < > 135* 122* 114* 95 121*  BUN 79*   < > 121*  --  165*  --  99* 123* 132*  CREATININE 3.17*   < > 3.95*  --  5.05*  --  3.40* 3.92* 4.12*  CALCIUM 9.1   < > 9.1  --  9.6  --  9.1 9.3 9.0  PHOS 5.2*  --  6.3*  --  6.3*  --  4.6  --  5.2*   < > =  values in this interval not displayed.     Liver Function Tests: Recent Labs  Lab 08/05/19 0539 08/06/19 0253 08/07/19 0854 08/08/19 0327 08/09/19 0104  ALBUMIN 3.2* 3.1* 3.0* 3.3* 3.0*        DVT prophylaxis: Heparin  Code Status: Full code  Family Communication: No family at bedside  Disposition Plan: likely home when medically ready for discharge  Pressure Injury 07/19/19 Sacrum Posterior;Mid Stage 2 -  Partial thickness loss of dermis presenting as a shallow open injury with a red, pink wound bed without slough. (Active)  07/19/19 0600  Location: Sacrum  Location Orientation: Posterior;Mid  Staging: Stage 2 -  Partial thickness loss of dermis presenting as a shallow open injury with a red, pink wound bed without slough.  Wound Description (Comments):   Present on Admission: No     Pressure Injury  07/22/19 Nare Right;Left Stage 2 -  Partial thickness loss of dermis presenting as a shallow open injury with a red, pink wound bed without slough. Injury r/t cortrak (Active)  07/22/19 1000  Location: Nare  Location Orientation: Right;Left  Staging: Stage 2 -  Partial thickness loss of dermis presenting as a shallow open injury with a red, pink wound bed without slough.  Wound Description (Comments): Injury r/t cortrak  Present on Admission: No        Scheduled medications:  . acetaminophen  500 mg Oral BID  . amLODipine  10 mg Oral Daily  . carvedilol  3.125 mg Oral BID WC  . Chlorhexidine Gluconate Cloth  6 each Topical Q0600  . collagenase   Topical BID  . darbepoetin (ARANESP) injection - NON-DIALYSIS  100 mcg Subcutaneous Q Sat-1800  . dextrose  1 Tube Oral Once  . diclofenac Sodium  2 g Topical BID  . diphenhydrAMINE  25 mg Oral Once  . famotidine  10 mg Oral Daily  . feeding supplement (ENSURE ENLIVE)  237 mL Oral BID BM  . feeding supplement (PRO-STAT SUGAR FREE 64)  30 mL Per Tube Daily  . fluconazole  200 mg Oral Daily  . gabapentin  100 mg Oral BID  . Gerhardt's butt cream   Topical TID  . heparin      . heparin  5,000 Units Subcutaneous Q8H  . loratadine  10 mg Oral Daily  . mouth rinse  15 mL Mouth Rinse BID  . neomycin-bacitracin-polymyxin   Topical BID  . nystatin  5 mL Mouth/Throat QID  . pantoprazole (PROTONIX) IV  40 mg Intravenous Q12H  . predniSONE  20 mg Oral Q breakfast  . sertraline  25 mg Oral BID  . sodium chloride flush  3 mL Intravenous Q12H  . sodium chloride irrigation  15 mL Irrigation QID  . thiamine  100 mg Oral Daily    Consultants:  Oncology  Nephrology  Palliative care  Procedures:    Antibiotics:   Anti-infectives (From admission, onward)   Start     Dose/Rate Route Frequency Ordered Stop   07/29/19 1500  fluconazole (DIFLUCAN) tablet 200 mg     200 mg Oral Daily 07/29/19 1458     07/28/19 1300  ceFAZolin (ANCEF) 2-4  GM/100ML-% IVPB    Note to Pharmacy: Kandy Garrison   : cabinet override      07/28/19 1300 07/28/19 1806   07/28/19 0700  ceFAZolin (ANCEF) IVPB 2g/100 mL premix     2 g 200 mL/hr over 30 Minutes Intravenous On call 07/27/19 1502 07/28/19 1340   07/18/19 1316  ceFAZolin (ANCEF) 2-4 GM/100ML-% IVPB    Note to Pharmacy: Manuela Neptune   : cabinet override      07/18/19 1316 07/18/19 1318   07/12/19 2115  ceFAZolin (ANCEF) IVPB 1 g/50 mL premix     1 g 100 mL/hr over 30 Minutes Intravenous Every 8 hours 07/12/19 2102 07/13/19 0516   07/03/19 2245  cefTRIAXone (ROCEPHIN) 2 g in sodium chloride 0.9 % 100 mL IVPB  Status:  Discontinued     2 g 200 mL/hr over 30 Minutes Intravenous Every 24 hours 07/03/19 2230 07/04/19 1336   07/03/19 2245  azithromycin (ZITHROMAX) 500 mg in sodium chloride 0.9 % 250 mL IVPB  Status:  Discontinued     500 mg 250 mL/hr over 60 Minutes Intravenous Every 24 hours 07/03/19 2230 07/05/19 1037       Objective   Vitals:   08/09/19 1000 08/09/19 1030 08/09/19 1100 08/09/19 1152  BP: 115/82 104/76 111/77 130/87  Pulse: 95 95 100 94  Resp:    18  Temp:    (!) 96.6 F (35.9 C)  TempSrc:    Axillary  SpO2:    100%  Weight:      Height:        Intake/Output Summary (Last 24 hours) at 08/09/2019 1203 Last data filed at 08/08/2019 2139 Gross per 24 hour  Intake 3 ml  Output --  Net 3 ml    01/18 1901 - 01/20 0700 In: 6 [I.V.:6] Out: -   Filed Weights   08/08/19 0500 08/09/19 0330 08/09/19 0657  Weight: 34 kg 44.9 kg 45.4 kg    Physical Examination:    General-appears lethargic  Heart-S1-S2, regular, no murmur auscultated  Lungs-clear to auscultation bilaterally, no wheezing or crackles auscultated  Abdomen-soft, nontender, no organomegaly  Extremities-no edema in the lower extremities  Neuro-alert, oriented x3, no focal deficit noted    Data Reviewed:   No results found for this or any previous visit (from the past 240 hour(s)).   No results for input(s): LIPASE, AMYLASE in the last 168 hours. Recent Labs  Lab 08/04/19 1158  AMMONIA 29    Cardiac Enzymes: No results for input(s): CKTOTAL, CKMB, CKMBINDEX, TROPONINI in the last 168 hours. BNP (last 3 results) Recent Labs    04/04/19 2000 07/03/19 1930 07/09/19 0347  BNP 76.7 1,124.1* 4,004.9*     Studies:  CT CHEST WO CONTRAST  Result Date: 08/08/2019 CLINICAL DATA:  Evaluate for malignancy. EXAM: CT CHEST WITHOUT CONTRAST TECHNIQUE: Multidetector CT imaging of the chest was performed following the standard protocol without IV contrast. COMPARISON:  07/03/2019. Bilateral areas of interlobular septal thickening also noted suggesting interstitial edema. Small solid nodule within the superior segment of left lower lobe is nonspecific measuring 4 mm. FINDINGS: Cardiovascular: There is cardiac enlargement. No pericardial effusion. Aortic atherosclerosis increase caliber of the ascending thoracic aorta measures 4.1 cm, image 69/3.The main pulmonary artery measures 3.5 cm. Prominent bilateral pulmonary arteries noted Mediastinum/Nodes: Normal appearance of the thyroid gland. The trachea appears patent and is midline. Normal appearance of the esophagus. No enlarged mediastinal nodes. The hilar structures are suboptimally evaluated due to lack of IV contrast material. Lungs/Pleura: Moderate bilateral pleural effusions are identified. Multifocal bilateral patchy areas of upper lobe ground-glass attenuation are noted. This is similar to 07/03/2019. Upper Abdomen: No acute abnormality. Musculoskeletal: No chest wall mass or suspicious bone lesions identified. IMPRESSION: 1. There is cardiac enlargement, bilateral pleural effusions, and interlobular septal thickening identified compatible with CHF. 2. Multifocal  patchy areas of ground-glass attenuation noted in both lungs. A similar finding was seen on study from 07/03/2019. Although nonspecific findings are concerning for  multifocal atypical infection/inflammation. 3. Increased caliber of the main pulmonary artery suggestive of PA hypertension. 4. Ascending thoracic aortic aneurysm identified. Recommend annual imaging followup by CTA or MRA. This recommendation follows 2010 ACCF/AHA/AATS/ACR/ASA/SCA/SCAI/SIR/STS/SVM Guidelines for the Diagnosis and Management of Patients with Thoracic Aortic Disease. Circulation. 2010; 121: T062-I948. Aortic aneurysm NOS (ICD10-I71.9) 5.  Aortic Atherosclerosis (ICD10-I70.0). Electronically Signed   By: Kerby Moors M.D.   On: 08/08/2019 15:21     Admission status: Inpatient: Based on patients clinical presentation and evaluation of above clinical data, I have made determination that patient meets Inpatient criteria at this time.   Oswald Hillock   [facility] If 7PM-7AM, please contact night-coverage at [email or web address], Office  [phone]  password [email or web address]  08/09/2019, 12:03 PM  LOS: 36 days

## 2019-08-09 NOTE — Procedures (Signed)
I was present at this dialysis session. I have reviewed the session itself and made appropriate changes.   TDC Qb 400. 3L UF goal. 2K bath.  Yesterday notes reviewed.  No UOP.    Filed Weights   08/08/19 0500 08/09/19 0330 08/09/19 0657  Weight: 34 kg 44.9 kg 45.4 kg    Recent Labs  Lab 08/09/19 0104  NA 129*  K 5.4*  CL 92*  CO2 19*  GLUCOSE 121*  BUN 132*  CREATININE 4.12*  CALCIUM 9.0  PHOS 5.2*    Recent Labs  Lab 08/08/19 0922 08/08/19 1052 08/09/19 0722  WBC 1.5* 1.4* 1.0*  NEUTROABS  --  0.6*  --   HGB 11.5* 10.9* 10.5*  HCT 40.4 36.8 34.8*  MCV 99.8 96.1 96.1  PLT 243 291 301    Scheduled Meds: . acetaminophen  500 mg Oral BID  . amLODipine  10 mg Oral Daily  . carvedilol  3.125 mg Oral BID WC  . Chlorhexidine Gluconate Cloth  6 each Topical Q0600  . collagenase   Topical BID  . darbepoetin (ARANESP) injection - NON-DIALYSIS  100 mcg Subcutaneous Q Sat-1800  . dextrose  1 Tube Oral Once  . diclofenac Sodium  2 g Topical BID  . diphenhydrAMINE  25 mg Oral Once  . famotidine  10 mg Oral Daily  . feeding supplement (NEPRO CARB STEADY)  1,000 mL Per Tube Q24H  . feeding supplement (PRO-STAT SUGAR FREE 64)  30 mL Per Tube Daily  . fluconazole  200 mg Oral Daily  . gabapentin  100 mg Oral BID  . Gerhardt's butt cream   Topical TID  . heparin  5,000 Units Subcutaneous Q8H  . loratadine  10 mg Oral Daily  . mouth rinse  15 mL Mouth Rinse BID  . neomycin-bacitracin-polymyxin   Topical BID  . nystatin  5 mL Mouth/Throat QID  . pantoprazole (PROTONIX) IV  40 mg Intravenous Q12H  . predniSONE  20 mg Oral Q breakfast  . sertraline  25 mg Oral BID  . sodium chloride flush  3 mL Intravenous Q12H  . sodium chloride irrigation  15 mL Irrigation QID  . thiamine  100 mg Oral Daily   Continuous Infusions: . sodium chloride    . sodium chloride    . sodium chloride     PRN Meds:.sodium chloride, sodium chloride, sodium chloride, acetaminophen (TYLENOL) oral  liquid 160 mg/5 mL, alteplase, alum & mag hydroxide-simeth, antiseptic oral rinse, bisacodyl, bisacodyl, camphor-menthol, fentaNYL (SUBLIMAZE) injection, heparin, hydrOXYzine, lidocaine (PF), lidocaine-prilocaine, ondansetron (ZOFRAN) IV, oxyCODONE, pentafluoroprop-tetrafluoroeth, polyethylene glycol, polyvinyl alcohol, senna-docusate, sodium chloride flush, witch hazel-glycerin   Pearson Grippe  MD 08/09/2019, 9:30 AM

## 2019-08-09 NOTE — Progress Notes (Signed)
Call placed to Dr. Carolin Sicks Rheumatology requesting inpatient consultation. Awaiting return call, details provided to office RN.

## 2019-08-09 NOTE — Progress Notes (Signed)
Alerted by Dr Tyrell Antonio that anti-RNA-polymerase-III abs was drawn. Results are positive.  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 However, patient has had extensive imaging this admission with no malignancy identified (CT chest non-contrast yesterday negative, CT abd/pelvis 01/16, and repeat SPEP shows no monoclonal protein, with a normal kappa-lambda ratio; she has also had negative pleural fluid cytology x4).  I do not recommend any further search for malignancy at this time. I have discussed validated screening studies with patient's husband and those can be operationalized as outpatient.  I expect the patient's dropping WBC is going to be due to her dysregulated immune system. Review of the blood film (discussed with pathology) does not show blasts or other findings suspicious for leukemia or dysplasia. There is a left shift to metamyelocyte and nucleated RBC,s both suggestive of marrow doing its best to make cells. Will repeat the retic, which was very elevated 07/11/2019. Will review again in AM. Would not start WBC growth factors at this time.  Will follow with you.

## 2019-08-09 NOTE — Progress Notes (Signed)
OT Cancellation Note  Patient Details Name: Joanna Ali MRN: [id number] DOB: Dec 25, 1950   Cancelled Treatment:    Reason Eval/Treat Not Completed: Patient at procedure or test/ unavailable(HD)  Jesse Sans 08/09/2019, 8:55 AM

## 2019-08-10 DIAGNOSIS — B3781 Candidal esophagitis: Secondary | ICD-10-CM

## 2019-08-10 DIAGNOSIS — R1310 Dysphagia, unspecified: Secondary | ICD-10-CM

## 2019-08-10 DIAGNOSIS — N186 End stage renal disease: Secondary | ICD-10-CM

## 2019-08-10 DIAGNOSIS — Z992 Dependence on renal dialysis: Secondary | ICD-10-CM

## 2019-08-10 DIAGNOSIS — B370 Candidal stomatitis: Secondary | ICD-10-CM

## 2019-08-10 DIAGNOSIS — R627 Adult failure to thrive: Secondary | ICD-10-CM

## 2019-08-10 LAB — GLUCOSE, CAPILLARY
Glucose-Capillary: 100 mg/dL — ABNORMAL HIGH (ref 70–99)
Glucose-Capillary: 124 mg/dL — ABNORMAL HIGH (ref 70–99)
Glucose-Capillary: 129 mg/dL — ABNORMAL HIGH (ref 70–99)
Glucose-Capillary: 140 mg/dL — ABNORMAL HIGH (ref 70–99)
Glucose-Capillary: 66 mg/dL — ABNORMAL LOW (ref 70–99)
Glucose-Capillary: 83 mg/dL (ref 70–99)
Glucose-Capillary: 87 mg/dL (ref 70–99)
Glucose-Capillary: 91 mg/dL (ref 70–99)

## 2019-08-10 LAB — CBC WITH DIFFERENTIAL/PLATELET
Abs Immature Granulocytes: 0.03 10*3/uL (ref 0.00–0.07)
Basophils Absolute: 0 10*3/uL (ref 0.0–0.1)
Basophils Relative: 1 %
Eosinophils Absolute: 0.2 10*3/uL (ref 0.0–0.5)
Eosinophils Relative: 11 %
HCT: 41.7 % (ref 36.0–46.0)
Hemoglobin: 12.1 g/dL (ref 12.0–15.0)
Immature Granulocytes: 2 %
Lymphocytes Relative: 28 %
Lymphs Abs: 0.4 10*3/uL — ABNORMAL LOW (ref 0.7–4.0)
MCH: 28 pg (ref 26.0–34.0)
MCHC: 29 g/dL — ABNORMAL LOW (ref 30.0–36.0)
MCV: 96.5 fL (ref 80.0–100.0)
Monocytes Absolute: 0.3 10*3/uL (ref 0.1–1.0)
Monocytes Relative: 21 %
Neutro Abs: 0.6 10*3/uL — ABNORMAL LOW (ref 1.7–7.7)
Neutrophils Relative %: 37 %
Platelets: 251 10*3/uL (ref 150–400)
RBC: 4.32 MIL/uL (ref 3.87–5.11)
RDW: 20.8 % — ABNORMAL HIGH (ref 11.5–15.5)
WBC: 1.5 10*3/uL — ABNORMAL LOW (ref 4.0–10.5)
nRBC: 4.7 % — ABNORMAL HIGH (ref 0.0–0.2)

## 2019-08-10 LAB — RENAL FUNCTION PANEL
Albumin: 3.1 g/dL — ABNORMAL LOW (ref 3.5–5.0)
Anion gap: 17 — ABNORMAL HIGH (ref 5–15)
BUN: 70 mg/dL — ABNORMAL HIGH (ref 8–23)
CO2: 21 mmol/L — ABNORMAL LOW (ref 22–32)
Calcium: 9.2 mg/dL (ref 8.9–10.3)
Chloride: 95 mmol/L — ABNORMAL LOW (ref 98–111)
Creatinine, Ser: 2.75 mg/dL — ABNORMAL HIGH (ref 0.44–1.00)
GFR calc Af Amer: 19 mL/min — ABNORMAL LOW (ref >60)
GFR calc non Af Amer: 17 mL/min — ABNORMAL LOW (ref >60)
Glucose, Bld: 118 mg/dL — ABNORMAL HIGH (ref 70–99)
Phosphorus: 4.5 mg/dL (ref 2.5–4.6)
Potassium: 4 mmol/L (ref 3.5–5.1)
Sodium: 133 mmol/L — ABNORMAL LOW (ref 135–145)

## 2019-08-10 LAB — SAVE SMEAR(SSMR), FOR PROVIDER SLIDE REVIEW

## 2019-08-10 LAB — RETICULOCYTES
Immature Retic Fract: 36.6 % — ABNORMAL HIGH (ref 2.3–15.9)
RBC.: 4.27 MIL/uL (ref 3.87–5.11)
Retic Count, Absolute: 392.4 10*3/uL — ABNORMAL HIGH (ref 19.0–186.0)
Retic Ct Pct: 9.2 % — ABNORMAL HIGH (ref 0.4–3.1)

## 2019-08-10 MED ORDER — BISACODYL 10 MG RE SUPP
10.0000 mg | Freq: Every day | RECTAL | Status: DC
  Filled 2019-08-10: qty 1

## 2019-08-10 MED ORDER — GLUCOSE 40 % PO GEL
ORAL | Status: AC
  Administered 2019-08-10: 37.5 g
  Filled 2019-08-10: qty 1

## 2019-08-10 MED ORDER — METHYLPREDNISOLONE SODIUM SUCC 125 MG IJ SOLR
100.0000 mg | Freq: Three times a day (TID) | INTRAMUSCULAR | Status: AC
  Administered 2019-08-10 – 2019-08-13 (×9): 100 mg via INTRAVENOUS
  Filled 2019-08-10 (×9): qty 2

## 2019-08-10 NOTE — Progress Notes (Signed)
Palliative Care Progress Note  Spoke with patient's husband at length re: his wife condition. I also reviewed her MAR in detail.  High dose steroids were started on 1/17 Solumedrol 169m but she only got only one dose. On 1/18 she received 517mSolumedrol IV on 1/19 this was decreased to Prednisone 2088mO. She also had a flare of severe pain at this time. Her decreases in steroid doses correlate with her increased pain and physical distress which has occurred now on three different occasions. [facility].  High dose pulse therapy for three-five days using IV Solumedrol 7mg29m/day= 300 md daily- will divide into three doses 100mg69mhours  IV Solumedrol 100mg 64murs X 3 to 5 days, if consistently improved on day three will transition to Prednisone 60 mg PO daily.  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 She would do very poorly in a SNF environment given the complexity of her needs-her pain, HD and her husband visits her daily and is actively involved in her care. He would choose to bring her home before discharging to a SNF and I would support that choice.Her husband also has researched tertiary academic facilities with active inpatient rheumatology programs- he would pay for medical transport to an academic center with [facility] [place] choices as a last resort.  I added on ASO and AntiDNA-ase to rheumatology work-up. 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  Husband continues to request consultation and oversight of her care by Rhe[facility]ty [facility].  [facility] Time: 60 minutes Greater than 50%  of this time was spent  counseling and coordinating care related to the above assessment and plan.

## 2019-08-10 NOTE — Progress Notes (Signed)
Inpatient Rehab Admissions Coordinator:   Have been following from a distance.  Pt continues to have difficulty with medical issues and only able to participate minimally in therapy.  Therapy recommendations continue to be for SNF level rehab and I feel this is most appropriate at this time.  Will sign off. TOC aware.  Shann Medal, PT, DPT Admissions Coordinator [phone] 08/10/19  10:23 AM

## 2019-08-10 NOTE — Progress Notes (Addendum)
Physical Therapy Treatment Patient Details Name: Mattilynn Forrer MRN: [id number] DOB: 10-27-1950 Today's Date: 08/10/2019    History of Present Illness Pt is a 69 y.o. female admitted 07/03/19 with anasarca, fatigue and tachycardia. 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 Pt with pericardial effusion s/p pericardial window. PEG tube placed. PMH includes former tobacco and alcohol abuse, HTN, chronic pleural effusion.    PT Comments    Pt remains limited by pain in sacrum during mobility. PT/OT assisting pt in bed mobility and functional transfers during session, finding tolerable chair position with 2 pillows underneath buttocks and small gap to provide offloading of sacrum. Pt generally weak and requiring significant physical assistance to perform functional mobility safely at this time. Pt will benefit from continued acute PT POC to improve tolerance for functional mobility, however if functional gains remain limited of patient plateaus then PT may sign off.   Follow Up Recommendations  SNF;Supervision/Assistance - 24 hour(if aligning with goals of care)     Equipment Recommendations  Wheelchair (measurements PT);Wheelchair cushion (measurements PT);Hospital bed(if going home)    Recommendations for Other Services       Precautions / Restrictions Precautions Precautions: Fall Precaution Comments: PEG tube Restrictions Weight Bearing Restrictions: No    Mobility  Bed Mobility Overal bed mobility: Needs Assistance Bed Mobility: Supine to Sit     Supine to sit: Mod assist;HOB elevated        Transfers Overall transfer level: Needs assistance Equipment used: 1 person hand held assist Transfers: Sit to/from [employer] Transfers Sit to Stand: Mod assist Stand pivot transfers: Max assist       General transfer comment: pt requiring assistance to support under BUE due to LE weakness  Ambulation/Gait Ambulation/Gait assistance: Max  assist Gait Distance (Feet): 2 Feet Assistive device: 1 person hand held assist Gait Pattern/deviations: Trunk flexed Gait velocity: decreased Gait velocity interpretation: <1.31 ft/sec, indicative of household ambulator General Gait Details: pt with short shuffling steps from bed to recliner   Stairs             Wheelchair Mobility    Modified Rankin (Stroke Patients Only)       Balance Overall balance assessment: Needs assistance Sitting-balance support: Single extremity supported;Feet supported Sitting balance-Leahy Scale: Fair Sitting balance - Comments: minG-close supervision at edge of bed with unilateral UE support   Standing balance support: Bilateral upper extremity supported Standing balance-Leahy Scale: Zero Standing balance comment: mod-maxA BUE support of PT                            Cognition Arousal/Alertness: Awake/alert Behavior During Therapy: Flat affect Overall Cognitive Status: Impaired/Different from baseline Area of Impairment: Problem solving                             Problem Solving: Slow processing;Requires verbal cues        Exercises      General Comments General comments (skin integrity, edema, etc.): spouse present and eager for education on benefits of OOB mobility and sitting upright. Pt grossly limited by general fatigue and discomfort in sacral area      Pertinent Vitals/Pain Pain Assessment: Faces Faces Pain Scale: Hurts even more Pain Location: buttocks Pain Descriptors / Indicators: Aching Pain Intervention(s): Limited activity within patient's tolerance;Repositioned    Home Living  Prior Function            PT Goals (current goals can now be found in the care plan section) Acute Rehab PT Goals Patient Stated Goal: to get stronger, possibly transfer hospitals per RN Progress towards PT goals: Not progressing toward goals - comment(limited by sacral pain)     Frequency    Min 2X/week      PT Plan Current plan remains appropriate    Co-evaluation PT/OT/SLP Co-Evaluation/Treatment: Yes Reason for Co-Treatment: To address functional/ADL transfers;Complexity of the patient's impairments (multi-system involvement) PT goals addressed during session: Mobility/safety with mobility;Balance        AM-PAC PT "6 Clicks" Mobility   Outcome Measure  Help needed turning from your back to your side while in a flat bed without using bedrails?: A Lot Help needed moving from lying on your back to sitting on the side of a flat bed without using bedrails?: A Lot Help needed moving to and from a bed to a chair (including a wheelchair)?: Total Help needed standing up from a chair using your arms (e.g., wheelchair or bedside chair)?: Total Help needed to walk in hospital room?: Total Help needed climbing 3-5 steps with a railing? : Total 6 Click Score: 8    End of Session Equipment Utilized During Treatment: (none) Activity Tolerance: Patient limited by fatigue;Patient limited by pain Patient left: in chair;with call bell/phone within reach;with family/visitor present Nurse Communication: Mobility status PT Visit Diagnosis: Unsteadiness on feet (R26.81);Muscle weakness (generalized) (M62.81);Difficulty in walking, not elsewhere classified (R26.2);Pain Pain - Right/Left: (central) Pain - part of body: (sacrum)     Time: 3818-2993 PT Time Calculation (min) (ACUTE ONLY): 30 min  Charges:  $Therapeutic Activity: 8-22 mins                     Zenaida Niece, PT, DPT Acute Rehabilitation Pager: [phone]    Zenaida Niece 08/10/2019, 2:17 PM

## 2019-08-10 NOTE — Plan of Care (Signed)

## 2019-08-10 NOTE — TOC Initial Note (Signed)
Transition of Care [facility]) - Initial/Assessment Note    Patient Details  Name: Joanna Ali MRN: [id number] Date of Birth: 02/28/1951  Transition of Care [facility]) CM/SW Contact:    Alberteen Sam, [place] Phone Number: [phone] 08/10/2019, 10:00 AM  Clinical Narrative:                  CSW spoke with patient's spouse Joneen Caraway regarding discharge planning, and informed of PT recommendation of SNF. He reports patient will not go to a SNF at discharge and he would like to get her transferred to a different hospital for rheumatology. CSW acknowledges chart notes that beds were explored at [place], however no beds. Russell requests we look into [facility] or [facility]. He reports preference for [facility] ([place] [facility]) as patient has family in [place]. CSW has informed RN and MD of this to follow up on hospital to hospital transfer as CSW does not initiate this.   CSW will continue to follow for any additional dc planning needs.   Expected Discharge Plan: Acute to Acute Transfer Barriers to Discharge: (bed avail at other hospital)   Patient Goals and CMS Choice   CMS Medicare.gov Compare Post Acute Care list provided to:: Patient Represenative (must comment)(spouse Russell) Choice offered to / list presented to : Spouse  Expected Discharge Plan and Services Expected Discharge Plan: Acute to Acute Transfer       Living arrangements for the past 2 months: Single Family Home                                      Prior Living Arrangements/Services Living arrangements for the past 2 months: Single Family Home Lives with:: Spouse Patient language and need for interpreter reviewed:: Yes Do you feel safe going back to the place where you live?: Yes      Need for Family Participation in Patient Care: Yes (Comment) Care giver support system in place?: Yes (comment)   Criminal Activity/Legal Involvement Pertinent to Current Situation/Hospitalization: No -  Comment as needed  Activities of Daily Living Home Assistive Devices/Equipment: Bedside commode/3-in-1, Cane (specify quad or straight), Eyeglasses ADL Screening (condition at time of admission) Patient's cognitive ability adequate to safely complete daily activities?: Yes Is the patient deaf or have difficulty hearing?: No Does the patient have difficulty seeing, even when wearing glasses/contacts?: No Does the patient have difficulty concentrating, remembering, or making decisions?: No Patient able to express need for assistance with ADLs?: No Does the patient have difficulty dressing or bathing?: No Independently performs ADLs?: Yes (appropriate for developmental age) Does the patient have difficulty walking or climbing stairs?: Yes Weakness of Legs: Both Weakness of Arms/Hands: None  Permission Sought/Granted Permission sought to share information with : Case Manager, Customer service manager, Family Supports Permission granted to share information with : Yes, Verbal Permission Granted  Share Information with NAME: Joneen Caraway     Permission granted to share info w Relationship: spouse  Permission granted to share info w Contact Information: [phone]  Emotional Assessment Appearance:: Appears stated age Attitude/Demeanor/Rapport: Unable to Assess Affect (typically observed): Unable to Assess Orientation: : Oriented to Self, Oriented to Place, Oriented to  Time, Oriented to Situation Alcohol / Substance Use: Not Applicable Psych Involvement: No (comment)  Admission diagnosis:  Shortness of breath [R06.02] Pericardial effusion [I31.3] Hypoxia [R09.02] Patient Active Problem List   Diagnosis Date Noted  . Dyspnea   .  S/P thoracentesis   . ESRD on hemodialysis ([place]) 07/31/2019  . Dysphagia   . Muscular weakness   . Thrush of mouth and esophagus ([place])   . Failure to thrive in adult   . Goals of care, counseling/discussion   . Palliative care by specialist   .  Pressure injury of skin 07/19/2019  . Pleural effusion   . Protein-calorie malnutrition, severe 07/05/2019  . Elevated troponin 07/04/2019  . Atypical pneumonia 07/04/2019  . Hypoxia 07/04/2019  . Pericardial effusion 04/07/2019  . Hypertension 04/07/2019  . Insomnia 04/07/2019  . Monoclonal paraproteinemia   . Anasarca 04/04/2019   PCP:  Renaldo Reel, DO Pharmacy:   [id number] [address] [address]Phone: [phone]: [phone] [facility] [place] [address]Phone: [phone]: [phone]    Social Determinants of Health (SDOH) Interventions    Readmission Risk Interventions No flowsheet data found.

## 2019-08-10 NOTE — Progress Notes (Signed)
[facility] Progress Note    Assessment/ Plan:   Pt is a 69 y.o. yo female  with history of HTN,  hepatic steatosis with low albumin, chronic pericardial effusion, moderate AR admitted on 12/17 for progressive dyspnea, anasarca , seen as a consultationfor AKIand fluid overload.  Now felt to have some kind of autoimmune process, started on high dose steroids   #Acute kidney injury,nonoliguric (crt normal in Sept, was 1.43 on 07/03/19) : thought to be hemodynamically mediated, possible HRS concomitant with use of IV contrast- U/A not remarkable.  Autoimmune work-up was unremarkable initially but now some consideration of scleroderma, need rheum eval formally.   Started HD 07/18/19 -  TDC.  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  Starting CLIP process.  Long way to go for outpt HD.  Tentatively has a spot in [place].  Cont on MWF: sig azotemia so longer Tx: 4h, 2-3L, 2K, Qb 400/800, TDC L IJ  #Hyperkalemia:  Has been having intermittent hyperK.  Try to avoid lokelma as it irritated throat. - now on nepro.  #Anasarca presumably due to liver disease which was diagnosed at [facility]. She has a history of liver steatosis.No evidence of GN/nephrotic syndrome. 3rd spacing also. volume status much improved but is still an issue- UF as able   #Moderate to large pericardial effusion:  s/p pericardial window on 12/23- TCTS following, stable  #Hypertension: Stable  Cont UF as able . #Anemia: s/p transfusion on 12/22.  s/p feraheme and on ESA weekly.  Monitor Hb 1/22 ad if remains elevated will hold upcoming ESA   #Hyperphosphatemia:  Normalized, no binder  **Malnutrition:  S/p G tube 1/8 -  Albumin low  Dispo:    Palliative following,  Trying to obtain rheum c/s inpatient.  Subjective:    No c/o this AM.  HD yesterday 3L UF    Objective:   BP 106/66 (BP Location: Left Arm)   Pulse 84   Temp 97.8 F (36.6 C) (Axillary)    Resp 16   Ht 5' 6"  (1.676 m)   Wt 44.5 kg   SpO2 100%   BMI 15.82 kg/m   Intake/Output Summary (Last 24 hours) at 08/10/2019 1043 Last data filed at 08/09/2019 2000 Gross per 24 hour  Intake 200 ml  Output 3000 ml  Net -2800 ml   Weight change: -1.814 kg  Physical Exam: Gen: cachectic, inc RR-  NAD CVS: RRR, well-healing scars Resp: normal WOB Abd: thin, mildly painful to palpation Ext: + flank edema ACCESS: L TDC c/d/i  Imaging: CT CHEST WO CONTRAST  Result Date: 08/08/2019 CLINICAL DATA:  Evaluate for malignancy. EXAM: CT CHEST WITHOUT CONTRAST TECHNIQUE: Multidetector CT imaging of the chest was performed following the standard protocol without IV contrast. COMPARISON:  07/03/2019. Bilateral areas of interlobular septal thickening also noted suggesting interstitial edema. Small solid nodule within the superior segment of left lower lobe is nonspecific measuring 4 mm. FINDINGS: Cardiovascular: There is cardiac enlargement. No pericardial effusion. Aortic atherosclerosis increase caliber of the ascending thoracic aorta measures 4.1 cm, image 69/3.The main pulmonary artery measures 3.5 cm. Prominent bilateral pulmonary arteries noted Mediastinum/Nodes: Normal appearance of the thyroid gland. The trachea appears patent and is midline. Normal appearance of the esophagus. No enlarged mediastinal nodes. The hilar structures are suboptimally evaluated due to lack of IV contrast material. Lungs/Pleura: Moderate bilateral pleural effusions are identified. Multifocal bilateral patchy areas of upper lobe ground-glass attenuation are noted. This is similar  to 07/03/2019. Upper Abdomen: No acute abnormality. Musculoskeletal: No chest wall mass or suspicious bone lesions identified. IMPRESSION: 1. There is cardiac enlargement, bilateral pleural effusions, and interlobular septal thickening identified compatible with CHF. 2. Multifocal patchy areas of ground-glass attenuation noted in both lungs. A  similar finding was seen on study from 07/03/2019. Although nonspecific findings are concerning for multifocal atypical infection/inflammation. 3. Increased caliber of the main pulmonary artery suggestive of PA hypertension. 4. Ascending thoracic aortic aneurysm identified. Recommend annual imaging followup by CTA or MRA. This recommendation follows 2010 ACCF/AHA/AATS/ACR/ASA/SCA/SCAI/SIR/STS/SVM Guidelines for the Diagnosis and Management of Patients with Thoracic Aortic Disease. Circulation. 2010; 121: M415-A309. Aortic aneurysm NOS (ICD10-I71.9) 5.  Aortic Atherosclerosis (ICD10-I70.0). Electronically Signed   By: Kerby Moors M.D.   On: 08/08/2019 15:21    Labs: BMET Recent Labs  Lab 08/04/19 0306 08/04/19 0306 08/05/19 0539 08/05/19 0539 08/06/19 0253 08/07/19 0854 08/07/19 1417 08/08/19 0327 08/08/19 1842 08/09/19 0104 08/10/19 0626  NA 131*   < > 130*  --  129* 129*  --  133* 132* 129* 133*  K 5.5*   < > 4.6  --  5.3* 6.1*  --  5.3* 5.0 5.4* 4.0  CL 91*   < > 91*  --  90* 88*  --  96* 95* 92* 95*  CO2 24   < > 24  --  21* 21*  --  20* 21* 19* 21*  GLUCOSE 140*   < > 149*   < > 137* 135* 122* 114* 95 121* 118*  BUN 129*   < > 79*  --  121* 165*  --  99* 123* 132* 70*  CREATININE 4.74*   < > 3.17*  --  3.95* 5.05*  --  3.40* 3.92* 4.12* 2.75*  CALCIUM 8.7*   < > 9.1  --  9.1 9.6  --  9.1 9.3 9.0 9.2  PHOS 7.2*  --  5.2*  --  6.3* 6.3*  --  4.6  --  5.2* 4.5   < > = values in this interval not displayed.   CBC Recent Labs  Lab 08/08/19 0922 08/08/19 1052 08/09/19 0722 08/10/19 0626  WBC 1.5* 1.4* 1.0* PENDING  NEUTROABS  --  0.6*  --  PENDING  HGB 11.5* 10.9* 10.5* PENDING  HCT 40.4 36.8 34.8* PENDING  MCV 99.8 96.1 96.1 PENDING  PLT 243 291 301 251    Medications:    . acetaminophen  500 mg Oral BID  . amLODipine  10 mg Oral Daily  . bisacodyl  10 mg Rectal Daily  . carvedilol  3.125 mg Oral BID WC  . Chlorhexidine Gluconate Cloth  6 each Topical Q0600  .  collagenase   Topical BID  . darbepoetin (ARANESP) injection - NON-DIALYSIS  100 mcg Subcutaneous Q Sat-1800  . diclofenac Sodium  2 g Topical BID  . famotidine  10 mg Oral Daily  . feeding supplement (ENSURE ENLIVE)  237 mL Oral BID BM  . feeding supplement (PRO-STAT SUGAR FREE 64)  30 mL Per Tube Daily  . fluconazole  200 mg Oral Daily  . gabapentin  100 mg Oral BID  . Gerhardt's butt cream   Topical TID  . heparin  5,000 Units Subcutaneous Q8H  . loratadine  10 mg Oral Daily  . mouth rinse  15 mL Mouth Rinse BID  . neomycin-bacitracin-polymyxin   Topical BID  . nystatin  5 mL Mouth/Throat QID  . pantoprazole (PROTONIX) IV  40 mg Intravenous Q12H  .  predniSONE  60 mg Oral Q breakfast  . sertraline  25 mg Oral BID  . sodium chloride flush  3 mL Intravenous Q12H  . sodium chloride irrigation  15 mL Irrigation QID  . thiamine  100 mg Oral Daily      [facility]  08/10/2019, 10:43 AM

## 2019-08-10 NOTE — Progress Notes (Signed)
[facility]  PROGRESS NOTE  Joanna Ali OIN:[id number] DOB: 1951-06-23 DOA: 07/03/2019 PCP: Renaldo Reel, DO   Brief HPI:   69 year old female with a history of hypertension, chronic pericardial effusion, moderate aortic valve regurgitation, former smoker, alcohol abuse initially admitted on 07/06/2019 for progressive shortness of breath with anasarca and fatigue.  She was previously worked up by [facility] rheumatology with negative work-up including ESR, lupus anticoagulant, rheumatoid factor, TSH, ANCA, ANA, light free chain studies.  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  Since that time patient had stopped drinking alcohol as advised.  She has been on diuretics and spironolactone without any improvement in symptoms.  CT of the chest on 07/04/2019 noted moderate pericardial effusion and airspace disease.  She underwent thoracentesis by PCCM which removed 700 mL of the fluid.  Patient underwent right heart cath on 07/06/2019 and pericardial window on 07/12/2019.  Attempts were made to diurese with IV diuretics but patient with worsening kidney function with creatinine up to 5.  Nephrology was formally consulted patient was started on dialysis.  She has been having poor p.o. intake so code tract was placed.  She was then later placed on PEG tube for persistent poor p.o. intake, also patient developed oral pain after NG tube placement.  She is on hemodialysis for AKI with rapid progression to end-stage renal disease, severe anasarca, pericardial effusion, status post pericardial window.  She was recently started on empiric steroids which will be weaned off.  Her labs showed RF positive at 69, INT RNA polymerase antibody +146 concern for scleroderma also she had some thickening of the skin with renal failure and pericardial involvement.  Pulmonary  was consulted to evaluate for ILD.  Dr. Regular diet discussed with rheumatology at [facility] who recommended supportive care, acid reflux treated with PPI, joint pain Voltaren gel and Tylenol.  Prednisone taper.  For Raynaud's phenomenon calcium channel blocker.  If patient has interstitial lung disease then she should be evaluated for immunotherapy can be as outpatient when stronger.  Rheumatology did not recommend immunotherapy for just joint pain and skin manifestation.    Subjective   Patient seen and examined, complained of chest pain yesterday.  EKG was unremarkable.  Denies shortness of breath or chest pain this morning.   Assessment/Plan:     1. ?  Scleroderma /anasarca/joint pain/pericardial effusion-patient presented with anasarca, failure to thrive, joint pains, pericardial effusion, Raynaud's phenomenon.  She had positive anti-RNA polymerase 3 antibody.  She was started on IV steroids to treat empirically for rheumatologic etiology.  Rheumatology evaluation in September 2020 showed CRP 13, ESR 25, ANA negative, c-ANCA/p-ANCA negative, scleroderma SCL 70 -, SSS (ro) negative, M spike negative electrophoresis, HIV negative.  Double-stranded DNA negative, ANA negative, RF 71.  Patient received IV Solu-Medrol 125 mg for 3 days then changed to 50 mg p.o. daily.  Rheumatology at [facility] recommended quick taper as we were considering scleroderma.  [place] do not have bed availability at this time.  Dr. Hilma Favors discussed with rheumatologist Dr. Kathlene November, who recommended high-dose steroids, patient started on prednisone 60 mg daily for 2 weeks and then slow taper.  2. Evaluation for malignancy- Patient developed leukopenia, oncology was consulted.  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 Jana Hakim saw the patient and does not recommend further search for malignancy.  As patient had extensive imaging  this admission with no malignancy identified, CT chest noncontrast  yesterday was negative, CT abdomen/pelvis on 1/16 was unremarkable and repeat SPEP showed no monoclonal protein with normal kappa lambda ratio.  She also had negative pleural fluid cytology x4.  Screening malignancy can be done as  Outpatient.  3. ESRD, new on HD-patient has progressively AKI found to be exacerbated by contrast-induced nephropathy.  She was started on hemodialysis with improvement of anasarca.  Outpatient dialysis has been set up.  4. Lethargy/encephalopathy-resolved, patient became lethargic and hemodialysis on 08/02/2019 she was receiving Ativan at that time.  Fentanyl was stopped, opiates were decreased.  Ammonia level 29, she received IV thiamine 250 mg 3 times daily for 3 days.  Thiamine level is currently pending.  5. Acute hypoxic respiratory failure/pulmonary edema/pleural effusion-she was started on IV Solu-Medrol by palliative care for presumed autoimmune process.  She had worsening dyspnea with pleural effusion, she underwent repeat thoracentesis on 08/01/2019.  Removed 1.1 L from left and 500 mL from right.  Pleural cytology showed reactive mesothelial cells.  Pleural fluid culture showed no growth.  PCCM was consulted for possible ILD.  6. Pericardial effusion with elevated troponin-patient is status post pericardial window on 07/12/2019.  Pathology showed no malignancy, chronic inflammation, AFB negative.  Fungal culture came back negative.  7. Atypical pneumonia with hypoxia-completed treatment.  8. Severe malnutrition/failure to thrive-continue with tube feeding.  Speech therapy consulted, and patient started on regular diet.  9. Dysphagia/thrush-group A strep was negative on 07/28/2019, patient currently on oral nystatin, oral Diflucan.  10. Anxiety-continue Zoloft, started on Xanax 0.25 mg 3 times daily as needed for panic attack.    11. Back pain-continue with oxycodone as needed, Voltaren gel twice a day.  12. Hypertension-continue Coreg, Norvasc.    SpO2: 100  % O2 Flow Rate (L/min): 2 L/min     Lab Results  Component Value Date   SARSCOV2NAA NEGATIVE 07/04/2019   [place] NEGATIVE 07/03/2019   [place] NEGATIVE 04/04/2019   SARSCOV2NAA NOT DETECTED 03/25/2019     CBG: Recent Labs  Lab 08/10/19 0145 08/10/19 0216 08/10/19 0352 08/10/19 0814 08/10/19 1141  GLUCAP 66* 87 83 129* 140*    CBC: Recent Labs  Lab 08/07/19 0854 08/08/19 0922 08/08/19 1052 08/09/19 0722 08/10/19 0626  WBC 2.8* 1.5* 1.4* 1.0* 1.5*  NEUTROABS  --   --  0.6*  --  0.6*  HGB 9.9* 11.5* 10.9* 10.5* 12.1  HCT 34.6* 40.4 36.8 34.8* 41.7  MCV 98.3 99.8 96.1 96.1 96.5  PLT 372 243 291 301 865    Basic Metabolic Panel: Recent Labs  Lab 08/06/19 0253 08/06/19 0253 08/07/19 0854 08/07/19 0854 08/07/19 1417 08/08/19 0327 08/08/19 1842 08/09/19 0104 08/10/19 0626  NA 129*   < > 129*  --   --  133* 132* 129* 133*  K 5.3*   < > 6.1*  --   --  5.3* 5.0 5.4* 4.0  CL 90*   < > 88*  --   --  96* 95* 92* 95*  CO2 21*   < > 21*  --   --  20* 21* 19* 21*  GLUCOSE 137*   < > 135*   < > 122* 114* 95 121* 118*  BUN 121*   < > 165*  --   --  99* 123* 132* 70*  CREATININE 3.95*   < > 5.05*  --   --  3.40* 3.92* 4.12* 2.75*  CALCIUM 9.1   < > 9.6  --   --  9.1 9.3 9.0 9.2  PHOS 6.3*  --  6.3*  --   --  4.6  --  5.2* 4.5   < > = values in this interval not displayed.     Liver Function Tests: Recent Labs  Lab 08/06/19 0253 08/07/19 0854 08/08/19 0327 08/09/19 0104 08/10/19 0626  ALBUMIN 3.1* 3.0* 3.3* 3.0* 3.1*        DVT prophylaxis: Heparin  Code Status: Full code  Family Communication: No family at bedside  Disposition Plan: likely skilled nursing facility versus CIR  Pressure Injury 07/19/19 Sacrum Posterior;Mid Stage 2 -  Partial thickness loss of dermis presenting as a shallow open injury with a red, pink wound bed without slough. (Active)  07/19/19 0600  Location: Sacrum  Location Orientation: Posterior;Mid  Staging: Stage 2  -  Partial thickness loss of dermis presenting as a shallow open injury with a red, pink wound bed without slough.  Wound Description (Comments):   Present on Admission: No     Pressure Injury 07/22/19 Nare Right;Left Stage 2 -  Partial thickness loss of dermis presenting as a shallow open injury with a red, pink wound bed without slough. Injury r/t cortrak (Active)  07/22/19 1000  Location: Nare  Location Orientation: Right;Left  Staging: Stage 2 -  Partial thickness loss of dermis presenting as a shallow open injury with a red, pink wound bed without slough.  Wound Description (Comments): Injury r/t cortrak  Present on Admission: No        Scheduled medications:  . acetaminophen  500 mg Oral BID  . amLODipine  10 mg Oral Daily  . bisacodyl  10 mg Rectal Daily  . carvedilol  3.125 mg Oral BID WC  . Chlorhexidine Gluconate Cloth  6 each Topical Q0600  . collagenase   Topical BID  . darbepoetin (ARANESP) injection - NON-DIALYSIS  100 mcg Subcutaneous Q Sat-1800  . diclofenac Sodium  2 g Topical BID  . famotidine  10 mg Oral Daily  . feeding supplement (ENSURE ENLIVE)  237 mL Oral BID BM  . feeding supplement (PRO-STAT SUGAR FREE 64)  30 mL Per Tube Daily  . fluconazole  200 mg Oral Daily  . gabapentin  100 mg Oral BID  . Gerhardt's butt cream   Topical TID  . heparin  5,000 Units Subcutaneous Q8H  . loratadine  10 mg Oral Daily  . mouth rinse  15 mL Mouth Rinse BID  . neomycin-bacitracin-polymyxin   Topical BID  . nystatin  5 mL Mouth/Throat QID  . pantoprazole (PROTONIX) IV  40 mg Intravenous Q12H  . predniSONE  60 mg Oral Q breakfast  . sertraline  25 mg Oral BID  . sodium chloride flush  3 mL Intravenous Q12H  . sodium chloride irrigation  15 mL Irrigation QID  . thiamine  100 mg Oral Daily    Consultants:  Oncology  Nephrology  Palliative care  Procedures:    Antibiotics:   Anti-infectives (From admission, onward)   Start     Dose/Rate Route Frequency  Ordered Stop   07/29/19 1500  fluconazole (DIFLUCAN) tablet 200 mg     200 mg Oral Daily 07/29/19 1458 08/11/19 2359   07/28/19 1300  ceFAZolin (ANCEF) 2-4 GM/100ML-% IVPB    Note to Pharmacy: Kandy Garrison   : cabinet override      07/28/19 1300 07/28/19 1806   07/28/19 0700  ceFAZolin (ANCEF) IVPB 2g/100 mL premix     2 g 200 mL/hr over 30 Minutes  Intravenous On call 07/27/19 1502 07/28/19 1340   07/18/19 1316  ceFAZolin (ANCEF) 2-4 GM/100ML-% IVPB    Note to Pharmacy: Manuela Neptune   : cabinet override      07/18/19 1316 07/18/19 1318   07/12/19 2115  ceFAZolin (ANCEF) IVPB 1 g/50 mL premix     1 g 100 mL/hr over 30 Minutes Intravenous Every 8 hours 07/12/19 2102 07/13/19 0516   07/03/19 2245  cefTRIAXone (ROCEPHIN) 2 g in sodium chloride 0.9 % 100 mL IVPB  Status:  Discontinued     2 g 200 mL/hr over 30 Minutes Intravenous Every 24 hours 07/03/19 2230 07/04/19 1336   07/03/19 2245  azithromycin (ZITHROMAX) 500 mg in sodium chloride 0.9 % 250 mL IVPB  Status:  Discontinued     500 mg 250 mL/hr over 60 Minutes Intravenous Every 24 hours 07/03/19 2230 07/05/19 1037       Objective   Vitals:   08/10/19 0100 08/10/19 0351 08/10/19 0814 08/10/19 1122  BP: 107/71 112/75 106/66 112/74  Pulse: 87 90 84 82  Resp: (!) 28 (!) 21 16 13   Temp:  97.9 F (36.6 C) 97.8 F (36.6 C) (!) 97.5 F (36.4 C)  TempSrc:  Axillary Axillary Axillary  SpO2: 100% 100% 100% 100%  Weight:  44.5 kg    Height:        Intake/Output Summary (Last 24 hours) at 08/10/2019 1201 Last data filed at 08/10/2019 1101 Gross per 24 hour  Intake 203 ml  Output --  Net 203 ml    01/19 1901 - 01/21 0700 In: 203 [I.V.:3] Out: 3000   Filed Weights   08/09/19 0657 08/09/19 1109 08/10/19 0351  Weight: 45.4 kg 43.1 kg 44.5 kg    Physical Examination:   General-appears lethargic Heart-S1-S2, regular, no murmur auscultated Lungs-clear to auscultation bilaterally, no wheezing or crackles  auscultated Abdomen-soft, nontender, no organomegaly Extremities-no edema in the lower extremities Neuro-somnolent but arousable, moving all extremities  Data Reviewed:   No results found for this or any previous visit (from the past 240 hour(s)).  No results for input(s): LIPASE, AMYLASE in the last 168 hours. Recent Labs  Lab 08/04/19 1158  AMMONIA 29    Cardiac Enzymes: No results for input(s): CKTOTAL, CKMB, CKMBINDEX, TROPONINI in the last 168 hours. BNP (last 3 results) Recent Labs    04/04/19 2000 07/03/19 1930 07/09/19 0347  BNP 76.7 1,124.1* 4,004.9*     Studies:  CT CHEST WO CONTRAST  Result Date: 08/08/2019 CLINICAL DATA:  Evaluate for malignancy. EXAM: CT CHEST WITHOUT CONTRAST TECHNIQUE: Multidetector CT imaging of the chest was performed following the standard protocol without IV contrast. COMPARISON:  07/03/2019. Bilateral areas of interlobular septal thickening also noted suggesting interstitial edema. Small solid nodule within the superior segment of left lower lobe is nonspecific measuring 4 mm. FINDINGS: Cardiovascular: There is cardiac enlargement. No pericardial effusion. Aortic atherosclerosis increase caliber of the ascending thoracic aorta measures 4.1 cm, image 69/3.The main pulmonary artery measures 3.5 cm. Prominent bilateral pulmonary arteries noted Mediastinum/Nodes: Normal appearance of the thyroid gland. The trachea appears patent and is midline. Normal appearance of the esophagus. No enlarged mediastinal nodes. The hilar structures are suboptimally evaluated due to lack of IV contrast material. Lungs/Pleura: Moderate bilateral pleural effusions are identified. Multifocal bilateral patchy areas of upper lobe ground-glass attenuation are noted. This is similar to 07/03/2019. Upper Abdomen: No acute abnormality. Musculoskeletal: No chest wall mass or suspicious bone lesions identified. IMPRESSION: 1. There is cardiac enlargement, bilateral  pleural  effusions, and interlobular septal thickening identified compatible with CHF. 2. Multifocal patchy areas of ground-glass attenuation noted in both lungs. A similar finding was seen on study from 07/03/2019. Although nonspecific findings are concerning for multifocal atypical infection/inflammation. 3. Increased caliber of the main pulmonary artery suggestive of PA hypertension. 4. Ascending thoracic aortic aneurysm identified. Recommend annual imaging followup by CTA or MRA. This recommendation follows 2010 ACCF/AHA/AATS/ACR/ASA/SCA/SCAI/SIR/STS/SVM Guidelines for the Diagnosis and Management of Patients with Thoracic Aortic Disease. Circulation. 2010; 121: B341-P379. Aortic aneurysm NOS (ICD10-I71.9) 5.  Aortic Atherosclerosis (ICD10-I70.0). Electronically Signed   By: Kerby Moors M.D.   On: 08/08/2019 15:21     Admission status: Inpatient: Based on patients clinical presentation and evaluation of above clinical data, I have made determination that patient meets Inpatient criteria at this time.   Oswald Hillock   [facility] If 7PM-7AM, please contact night-coverage at [email or web address], Office  [phone]  password [email or web address]  08/10/2019, 12:01 PM  LOS: 37 days

## 2019-08-10 NOTE — Progress Notes (Addendum)
Occupational Therapy Treatment Patient Details Name: Joanna Ali MRN: [id number] DOB: 07/16/1951 Today's Date: 08/10/2019    History of present illness Pt is a 69 y.o. female admitted 07/03/19 with anasarca, fatigue and tachycardia. 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 Pt with pericardial effusion s/p pericardial window. PEG tube placed. PMH includes former tobacco and alcohol abuse, HTN, chronic pleural effusion.   OT comments  Pt presents with generalized weakness, pain, fatigue - limiting ability to participate in therapy. Pt with delayed response to questions - but did smile at jokes and was able to make her wishes known. PT/OT assisting pt in bed mobility and functional transfers (able to perform with one person face to face assist) during session, finding tolerable chair position with 2 pillows underneath buttocks and small gap to provide offloading of sacrum. Pt total A for grooming once in chair. Encouraged husband to work on ROM for BLE at the bed level (Pt knees painful and would not straighten impacting ability to transfer). Pt on RA throughout session, and VSS.    Follow Up Recommendations  SNF;Supervision/Assistance - 24 hour    Equipment Recommendations  Hospital bed;Wheelchair (measurements OT);Wheelchair cushion (measurements OT);3 in 1 bedside commode    Recommendations for Other Services      Precautions / Restrictions Precautions Precautions: Fall Precaution Comments: PEG tube Restrictions Weight Bearing Restrictions: No       Mobility Bed Mobility Overal bed mobility: Needs Assistance Bed Mobility: Supine to Sit     Supine to sit: Mod assist;HOB elevated        Transfers Overall transfer level: Needs assistance Equipment used: 1 person hand held assist Transfers: Sit to/from [facility] Sit to Stand: Mod assist Stand pivot transfers: Max assist       General transfer comment: pt requiring assistance to  support under BUE due to LE weakness    Balance Overall balance assessment: Needs assistance Sitting-balance support: Single extremity supported;Feet supported Sitting balance-Leahy Scale: Fair Sitting balance - Comments: minG-close supervision at edge of bed with unilateral UE support   Standing balance support: Bilateral upper extremity supported Standing balance-Leahy Scale: Zero Standing balance comment: mod-maxA BUE support of PT                           ADL either performed or assessed with clinical judgement   ADL Overall ADL's : Needs assistance/impaired     Grooming: Brushing hair;Sitting;Total assistance Grooming Details (indicate cue type and reason): in the recliner                 Toilet Transfer: Maximal assistance;Squat-pivot Toilet Transfer Details (indicate cue type and reason): face to face Toileting- Clothing Manipulation and Hygiene: Total assistance               Vision       Perception     Praxis      Cognition Arousal/Alertness: Awake/alert Behavior During Therapy: Flat affect Overall Cognitive Status: Impaired/Different from baseline Area of Impairment: Problem solving                             Problem Solving: Slow processing;Requires verbal cues General Comments: delayed response to questions        Exercises     Shoulder Instructions       General Comments spouse present and eager for education on benefits of OOB mobility and  sitting upright. Pt grossly limited by general fatigue and discomfort in sacral area    Pertinent Vitals/ Pain       Pain Assessment: Faces Faces Pain Scale: Hurts even more Pain Location: buttocks/knees Pain Descriptors / Indicators: Aching Pain Intervention(s): Limited activity within patient's tolerance;Monitored during session;Repositioned  Home Living                                          Prior Functioning/Environment               Frequency  Min 1X/week        Progress Toward Goals  OT Goals(current goals can now be found in the care plan section)  Progress towards OT goals: Not progressing toward goals - comment(Pt profoundly weak)  Acute Rehab OT Goals Patient Stated Goal: to get stronger, possibly transfer hospitals per RN OT Goal Formulation: With family Time For Goal Achievement: 08/18/19 Potential to Achieve Goals: [place] Discharge plan remains appropriate;Frequency needs to be updated    Co-evaluation    PT/OT/SLP Co-Evaluation/Treatment: Yes Reason for Co-Treatment: Complexity of the patient's impairments (multi-system involvement);For patient/therapist safety;To address functional/ADL transfers PT goals addressed during session: Mobility/safety with mobility;Balance OT goals addressed during session: ADL's and self-care      AM-PAC OT "6 Clicks" Daily Activity     Outcome Measure   Help from another person eating meals?: Total Help from another person taking care of personal grooming?: Total Help from another person toileting, which includes using toliet, bedpan, or urinal?: Total Help from another person bathing (including washing, rinsing, drying)?: Total Help from another person to put on and taking off regular upper body clothing?: Total Help from another person to put on and taking off regular lower body clothing?: Total 6 Click Score: 6    End of Session    OT Visit Diagnosis: Unsteadiness on feet (R26.81);Pain;Muscle weakness (generalized) (M62.81);Other symptoms and signs involving cognitive function Pain - part of body: Knee(sacral region)   Activity Tolerance Patient limited by pain;Patient limited by lethargy   Patient Left in chair;with call bell/phone within reach;with family/visitor present(2 pillows placed in bottom of chair to unweight sacral)   Nurse Communication          Time: 8875-7972 OT Time Calculation (min): 30 min  Charges: OT General  Charges $OT Visit: 1 Visit OT Treatments $Self Care/Home Management : 8-22 mins Jesse Sans OTR/L Acute Rehabilitation Services Pager: [phone] Office: [place] 08/10/2019, 4:15 PM

## 2019-08-10 NOTE — Progress Notes (Signed)
Spoke with Dr. Kathlene November, [facility] Rheumatology. Discussed case in detail, he will review the chart today and make any additional recommendations for rheumatologic disease diagnostics and management. He recommended following high dose steroids (pulse dose) with Prednisone 68m daily, followed by slow taper and hopefully she can improve enough to have outpatient treatment plan established.  Noted documentation of patient nausea. I do not see where patient has had a bowel movement documented since 1/16. Will schedule laxative and treat for constipation.  Will continue to follow for symptom management and ongoing goals of care discussions.  ELane Hacker DO Palliative Medicine [phone] Time: 25 min Greater than 50%  of this time was spent counseling and coordinating care related to the above assessment and plan.

## 2019-08-11 ENCOUNTER — Ambulatory Visit: Payer: Medicare Other | Admitting: Thoracic Surgery (Cardiothoracic Vascular Surgery)

## 2019-08-11 DIAGNOSIS — I4891 Unspecified atrial fibrillation: Secondary | ICD-10-CM

## 2019-08-11 DIAGNOSIS — N179 Acute kidney failure, unspecified: Secondary | ICD-10-CM

## 2019-08-11 LAB — RENAL FUNCTION PANEL
Albumin: 3 g/dL — ABNORMAL LOW (ref 3.5–5.0)
Anion gap: 18 — ABNORMAL HIGH (ref 5–15)
BUN: 108 mg/dL — ABNORMAL HIGH (ref 8–23)
CO2: 21 mmol/L — ABNORMAL LOW (ref 22–32)
Calcium: 9.3 mg/dL (ref 8.9–10.3)
Chloride: 92 mmol/L — ABNORMAL LOW (ref 98–111)
Creatinine, Ser: 3.64 mg/dL — ABNORMAL HIGH (ref 0.44–1.00)
GFR calc Af Amer: 14 mL/min — ABNORMAL LOW (ref >60)
GFR calc non Af Amer: 12 mL/min — ABNORMAL LOW (ref >60)
Glucose, Bld: 140 mg/dL — ABNORMAL HIGH (ref 70–99)
Phosphorus: 5.8 mg/dL — ABNORMAL HIGH (ref 2.5–4.6)
Potassium: 4.8 mmol/L (ref 3.5–5.1)
Sodium: 131 mmol/L — ABNORMAL LOW (ref 135–145)

## 2019-08-11 LAB — CBC
HCT: 39.8 % (ref 36.0–46.0)
Hemoglobin: 11.5 g/dL — ABNORMAL LOW (ref 12.0–15.0)
MCH: 28 pg (ref 26.0–34.0)
MCHC: 28.9 g/dL — ABNORMAL LOW (ref 30.0–36.0)
MCV: 96.8 fL (ref 80.0–100.0)
Platelets: 234 10*3/uL (ref 150–400)
RBC: 4.11 MIL/uL (ref 3.87–5.11)
RDW: 20.1 % — ABNORMAL HIGH (ref 11.5–15.5)
WBC: 1.6 10*3/uL — ABNORMAL LOW (ref 4.0–10.5)
nRBC: 2.5 % — ABNORMAL HIGH (ref 0.0–0.2)

## 2019-08-11 LAB — GLUCOSE, CAPILLARY
Glucose-Capillary: 127 mg/dL — ABNORMAL HIGH (ref 70–99)
Glucose-Capillary: 136 mg/dL — ABNORMAL HIGH (ref 70–99)
Glucose-Capillary: 92 mg/dL (ref 70–99)

## 2019-08-11 LAB — ANTISTREPTOLYSIN O TITER: ASO: 43 IU/mL (ref 0.0–200.0)

## 2019-08-11 MED ORDER — HEPARIN SODIUM (PORCINE) 1000 UNIT/ML IJ SOLN
INTRAMUSCULAR | Status: AC
  Filled 2019-08-11: qty 4

## 2019-08-11 MED ORDER — DARBEPOETIN ALFA 100 MCG/0.5ML IJ SOSY: 100.0000 ug | PREFILLED_SYRINGE | INTRAMUSCULAR | Status: DC

## 2019-08-11 MED ORDER — METOPROLOL TARTRATE 25 MG PO TABS
25.0000 mg | ORAL_TABLET | Freq: Two times a day (BID) | ORAL | Status: DC
  Administered 2019-08-11 – 2019-08-14 (×6): 25 mg via ORAL
  Filled 2019-08-11 (×8): qty 1

## 2019-08-11 NOTE — Plan of Care (Signed)

## 2019-08-11 NOTE — Progress Notes (Signed)
Ms Reach WBC are improving without white cell growth factors:  Results for CHANCI, OJALA (MRN [id number]) as of 08/11/2019 08:31  Ref. Range 08/08/2019 09:22 08/08/2019 10:52 08/09/2019 07:22 08/10/2019 06:26 08/11/2019 02:34  WBC Latest Ref Range: 4.0 - 10.5 K/uL 1.5 (L) 1.4 (LL) 1.0 (LL) 1.5 (L) 1.6 (L)  This requires only continued follow-up at this point.  She has had a very through workup for possible occult malignancy given her anti-RNA-l\polymerase-III antibodies w no malignancy identified. No further evaluation for malignancy is indicated at this point.   I agree with Dr Delanna Ahmadi note and am hopeful patient can be transferred to an inpatient rheumatologic facility as is the family's wish.  I will sign off. Please reconsult hematology as needed.

## 2019-08-11 NOTE — Progress Notes (Signed)
[facility]  PROGRESS NOTE  Joanna Ali WUJ:[id number] DOB: [birth year] DOA: 07/03/2019 PCP: Renaldo Reel, DO   Brief HPI:   69 year old female with a history of hypertension, chronic pericardial effusion, moderate aortic valve regurgitation, former smoker, alcohol abuse initially admitted on 07/06/2019 for progressive shortness of breath with anasarca and fatigue.  She was previously worked up by [facility] rheumatology with negative work-up including ESR, lupus anticoagulant, rheumatoid factor, TSH, ANCA, ANA, light free chain studies.  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  Since that time patient had stopped drinking alcohol as advised.  She has been on diuretics and spironolactone without any improvement in symptoms.  CT of the chest on 07/04/2019 noted moderate pericardial effusion and airspace disease.  She underwent thoracentesis by PCCM which removed 700 mL of the fluid.  Patient underwent right heart cath on 07/06/2019 and pericardial window on 07/12/2019.  Attempts were made to diurese with IV diuretics but patient with worsening kidney function with creatinine up to 5.  Nephrology was formally consulted patient was started on dialysis.  She has been having poor p.o. intake so code tract was placed.  She was then later placed on PEG tube for persistent poor p.o. intake, also patient developed oral pain after NG tube placement.  She is on hemodialysis for AKI with rapid progression to end-stage renal disease, severe anasarca, pericardial effusion, status post pericardial window.  She was recently started on empiric steroids which will be weaned off.  Her labs showed RF positive at 69, INT RNA polymerase antibody +146 concern for scleroderma also she had some thickening of the skin with renal failure and pericardial involvement.  Pulmonary  was consulted to evaluate for ILD.  Dr. Regular diet discussed with rheumatology at [facility] who recommended supportive care, acid reflux treated with PPI, joint pain Voltaren gel and Tylenol.  Prednisone taper.  For Raynaud's phenomenon calcium channel blocker.  If patient has interstitial lung disease then she should be evaluated for immunotherapy can be as outpatient when stronger.  Rheumatology did not recommend immunotherapy for just joint pain and skin manifestation.    Subjective   Patient seen and examined, very lethargic.  Just came back from dialysis.  Heart monitor showing A. fib with RVR.  New diagnosis.   Assessment/Plan:     1. ?  Scleroderma /anasarca/joint pain/pericardial effusion-patient presented with anasarca, failure to thrive, joint pains, pericardial effusion, Raynaud's phenomenon.  She had positive anti-RNA polymerase 3 antibody.  She was started on IV steroids to treat empirically for rheumatologic etiology.  Rheumatology evaluation in September 2020 showed CRP 13, ESR 25, ANA negative, c-ANCA/p-ANCA negative, scleroderma SCL 70 -, SSS (ro) negative, M spike negative electrophoresis, HIV negative.  Double-stranded DNA negative, ANA negative, RF 71.  Patient received IV Solu-Medrol 125 mg for 3 days then changed to 50 mg p.o. daily.  Rheumatology at [facility] recommended quick taper as we were considering scleroderma.  [facility] do not have bed availability at this time.  Dr. Hilma Favors discussed with rheumatologist Dr. Kathlene November, who recommended high-dose steroids, patient started on Solu-Medrol 125 mg IV for 3 days then switch to  prednisone 60 mg daily for 2 weeks and then slow taper.  2. Atrial fibrillation RVR-patient is on Coreg.  Coreg was held this morning before dialysis, just given 30 minutes ago.  Heart rate ranging from 90s  to 180s.  If Coreg does not work she might need Cardizem infusion.  Patient is not a good candidate for anticoagulation considering  multiple comorbid conditions, pericardial window.  Cardiology has been following patient since her hospitalization.  Will consult cardiology again for further recommendations.  3. Evaluation for malignancy- Patient developed leukopenia, oncology was consulted.  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 Jana Hakim saw the patient and does not recommend further search for malignancy.  As patient had extensive imaging this admission with no malignancy identified, CT chest noncontrast yesterday was negative, CT abdomen/pelvis on 1/16 was unremarkable and repeat SPEP showed no monoclonal protein with normal kappa lambda ratio.  She also had negative pleural fluid cytology x4.  Screening malignancy can be done as  Outpatient.  4. ESRD, new on HD-patient has progressively AKI found to be exacerbated by contrast-induced nephropathy.  She was started on hemodialysis with improvement of anasarca.  Outpatient dialysis has been set up.  5. Lethargy/encephalopathy-resolved, patient became lethargic and hemodialysis on 08/02/2019 she was receiving Ativan at that time.  Fentanyl was stopped, opiates were decreased.  Ammonia level 29, she received IV thiamine 250 mg 3 times daily for 3 days.  Thiamine level is 192.5.  6. Acute hypoxic respiratory failure/pulmonary edema/pleural effusion-she was started on IV Solu-Medrol by palliative care for presumed autoimmune process.  She had worsening dyspnea with pleural effusion, she underwent repeat thoracentesis on 08/01/2019.  Removed 1.1 L from left and 500 mL from right.  Pleural cytology showed reactive mesothelial cells.  Pleural fluid culture showed no growth.  PCCM was consulted for possible ILD.  7. Pericardial effusion with elevated troponin-patient is status post pericardial window on 07/12/2019.  Pathology showed no malignancy, chronic inflammation, AFB negative.  Fungal culture came back negative.  8. Atypical pneumonia with hypoxia-completed  treatment.  9. Severe malnutrition/failure to thrive-continue with tube feeding.  Speech therapy consulted, and patient started on regular diet.  10. Dysphagia/thrush-group A strep was negative on 07/28/2019, patient currently on oral nystatin, oral Diflucan.  11. Anxiety-continue Zoloft, started on Xanax 0.25 mg 3 times daily as needed for panic attack.    12. Back pain-continue with oxycodone as needed, Voltaren gel twice a day.  13. Hypertension-continue Coreg, Norvasc.    SpO2: 100 % O2 Flow Rate (L/min): 2 L/min     Lab Results  Component Value Date   SARSCOV2NAA NEGATIVE 07/04/2019   [place] NEGATIVE 07/03/2019   [place] NEGATIVE 04/04/2019   SARSCOV2NAA NOT DETECTED 03/25/2019     CBG: Recent Labs  Lab 08/10/19 1141 08/10/19 1605 08/10/19 1914 08/10/19 2349 08/11/19 0306  GLUCAP 140* 124* 91 100* 136*    CBC: Recent Labs  Lab 08/08/19 0922 08/08/19 1052 08/09/19 0722 08/10/19 0626 08/11/19 0234  WBC 1.5* 1.4* 1.0* 1.5* 1.6*  NEUTROABS  --  0.6*  --  0.6*  --   HGB 11.5* 10.9* 10.5* 12.1 11.5*  HCT 40.4 36.8 34.8* 41.7 39.8  MCV 99.8 96.1 96.1 96.5 96.8  PLT 243 291 301 251 144    Basic Metabolic Panel: Recent Labs  Lab 08/07/19 0854 08/07/19 1417 08/08/19 0327 08/08/19 1842 08/09/19 0104 08/10/19 0626 08/11/19 0234  NA 129*  --  133* 132* 129* 133* 131*  K 6.1*  --  5.3* 5.0 5.4* 4.0 4.8  CL 88*  --  96* 95* 92* 95* 92*  CO2 21*  --  20* 21* 19* 21* 21*  GLUCOSE 135*   < > 114* 95 121* 118* 140*  BUN 165*  --  99* 123* 132* 70* 108*  CREATININE 5.05*  --  3.40* 3.92* 4.12* 2.75* 3.64*  CALCIUM 9.6  --  9.1 9.3 9.0 9.2 9.3  PHOS 6.3*  --  4.6  --  5.2* 4.5 5.8*   < > = values in this interval not displayed.     Liver Function Tests: Recent Labs  Lab 08/07/19 0854 08/08/19 0327 08/09/19 0104 08/10/19 0626 08/11/19 0234  ALBUMIN 3.0* 3.3* 3.0* 3.1* 3.0*        DVT prophylaxis: Heparin  Code Status: Full  code  Family Communication: No family at bedside  Disposition Plan: likely skilled nursing facility versus CIR  Pressure Injury 07/19/19 Sacrum Posterior;Mid Stage 2 -  Partial thickness loss of dermis presenting as a shallow open injury with a red, pink wound bed without slough. (Active)  07/19/19 0600  Location: Sacrum  Location Orientation: Posterior;Mid  Staging: Stage 2 -  Partial thickness loss of dermis presenting as a shallow open injury with a red, pink wound bed without slough.  Wound Description (Comments):   Present on Admission: No     Pressure Injury 07/22/19 Nare Right;Left Stage 2 -  Partial thickness loss of dermis presenting as a shallow open injury with a red, pink wound bed without slough. Injury r/t cortrak (Active)  07/22/19 1000  Location: Nare  Location Orientation: Right;Left  Staging: Stage 2 -  Partial thickness loss of dermis presenting as a shallow open injury with a red, pink wound bed without slough.  Wound Description (Comments): Injury r/t cortrak  Present on Admission: No        Scheduled medications:  . acetaminophen  500 mg Oral BID  . amLODipine  10 mg Oral Daily  . bisacodyl  10 mg Rectal Daily  . carvedilol  3.125 mg Oral BID WC  . Chlorhexidine Gluconate Cloth  6 each Topical Q0600  . collagenase   Topical BID  . [START ON 08/19/2019] darbepoetin (ARANESP) injection - NON-DIALYSIS  100 mcg Subcutaneous Q Sat-1800  . diclofenac Sodium  2 g Topical BID  . famotidine  10 mg Oral Daily  . feeding supplement (ENSURE ENLIVE)  237 mL Oral BID BM  . feeding supplement (PRO-STAT SUGAR FREE 64)  30 mL Per Tube Daily  . fluconazole  200 mg Oral Daily  . gabapentin  100 mg Oral BID  . Gerhardt's butt cream   Topical TID  . heparin      . heparin  5,000 Units Subcutaneous Q8H  . loratadine  10 mg Oral Daily  . mouth rinse  15 mL Mouth Rinse BID  . methylPREDNISolone (SOLU-MEDROL) injection  100 mg Intravenous Q8H  .  neomycin-bacitracin-polymyxin   Topical BID  . nystatin  5 mL Mouth/Throat QID  . pantoprazole (PROTONIX) IV  40 mg Intravenous Q12H  . sertraline  25 mg Oral BID  . sodium chloride flush  3 mL Intravenous Q12H  . sodium chloride irrigation  15 mL Irrigation QID  . thiamine  100 mg Oral Daily    Consultants:  Oncology  Nephrology  Palliative care  Procedures:    Antibiotics:   Anti-infectives (From admission, onward)   Start     Dose/Rate Route Frequency Ordered Stop   07/29/19 1500  fluconazole (DIFLUCAN) tablet 200 mg     200 mg Oral Daily 07/29/19 1458 08/11/19 2359   07/28/19 1300  ceFAZolin (ANCEF) 2-4 GM/100ML-% IVPB    Note to Pharmacy: Kandy [place]   :  cabinet override      07/28/19 1300 07/28/19 1806   07/28/19 0700  ceFAZolin (ANCEF) IVPB 2g/100 mL premix     2 g 200 mL/hr over 30 Minutes Intravenous On call 07/27/19 1502 07/28/19 1340   07/18/19 1316  ceFAZolin (ANCEF) 2-4 GM/100ML-% IVPB    Note to Pharmacy: Manuela Neptune   : cabinet override      07/18/19 1316 07/18/19 1318   07/12/19 2115  ceFAZolin (ANCEF) IVPB 1 g/50 mL premix     1 g 100 mL/hr over 30 Minutes Intravenous Every 8 hours 07/12/19 2102 07/13/19 0516   07/03/19 2245  cefTRIAXone (ROCEPHIN) 2 g in sodium chloride 0.9 % 100 mL IVPB  Status:  Discontinued     2 g 200 mL/hr over 30 Minutes Intravenous Every 24 hours 07/03/19 2230 07/04/19 1336   07/03/19 2245  azithromycin (ZITHROMAX) 500 mg in sodium chloride 0.9 % 250 mL IVPB  Status:  Discontinued     500 mg 250 mL/hr over 60 Minutes Intravenous Every 24 hours 07/03/19 2230 07/05/19 1037       Objective   Vitals:   08/11/19 1100 08/11/19 1130 08/11/19 1134 08/11/19 1155  BP: 105/78 (!) 80/58 101/74 118/84  Pulse: (!) 120 (!) 124 (!) 111 99  Resp: (!) 24 (!) 25 20 20   Temp:      TempSrc:   Oral   SpO2:   100%   Weight:      Height:        Intake/Output Summary (Last 24 hours) at 08/11/2019 1359 Last data filed at  08/11/2019 1134 Gross per 24 hour  Intake 418 ml  Output 2802 ml  Net -2384 ml    01/20 1901 - 01/22 0700 In: 032 [P.O.:130; I.V.:6] Out: -   Filed Weights   08/10/19 0351 08/11/19 0601 08/11/19 0738  Weight: 44.5 kg 41.3 kg 41 kg    Physical Examination:   General-lethargic Heart-S1-S2, regular, no murmur auscultated Lungs-clear to auscultation bilaterally, no wheezing or crackles auscultated Abdomen-soft, nontender, no organomegaly Extremities-no edema in the lower extremities Neuro-somnolent, but arousable  Data Reviewed:   No results found for this or any previous visit (from the past 240 hour(s)).  No results for input(s): LIPASE, AMYLASE in the last 168 hours. No results for input(s): AMMONIA in the last 168 hours.  Cardiac Enzymes: No results for input(s): CKTOTAL, CKMB, CKMBINDEX, TROPONINI in the last 168 hours. BNP (last 3 results) Recent Labs    04/04/19 2000 07/03/19 1930 07/09/19 0347  BNP 76.7 1,124.1* 4,004.9*     Studies:  No results found.   Admission status: Inpatient: Based on patients clinical presentation and evaluation of above clinical data, I have made determination that patient meets Inpatient criteria at this time.   Oswald Hillock   [facility] If 7PM-7AM, please contact night-coverage at [email or web address], Office  [phone]  password [email or web address]  08/11/2019, 1:59 PM  LOS: 38 days

## 2019-08-11 NOTE — Procedures (Signed)
I was present at this dialysis session. I have reviewed the session itself and made appropriate changes.   4h, 3L UF, 2K bath.  Yest notes reviewed.  Tol HD.  Hb 11.5, down from yesterday.  Tentative next HD MWF schedule. Will follow over weekend.     Filed Weights   08/10/19 0351 08/11/19 0601 08/11/19 0738  Weight: 44.5 kg 41.3 kg 41 kg    Recent Labs  Lab 08/11/19 0234  NA 131*  K 4.8  CL 92*  CO2 21*  GLUCOSE 140*  BUN 108*  CREATININE 3.64*  CALCIUM 9.3  PHOS 5.8*    Recent Labs  Lab 08/08/19 1052 08/08/19 1052 08/09/19 0722 08/10/19 0626 08/11/19 0234  WBC 1.4*   < > 1.0* 1.5* 1.6*  NEUTROABS 0.6*  --   --  0.6*  --   HGB 10.9*   < > 10.5* 12.1 11.5*  HCT 36.8   < > 34.8* 41.7 39.8  MCV 96.1   < > 96.1 96.5 96.8  PLT 291   < > 301 251 234   < > = values in this interval not displayed.    Scheduled Meds: . acetaminophen  500 mg Oral BID  . amLODipine  10 mg Oral Daily  . bisacodyl  10 mg Rectal Daily  . carvedilol  3.125 mg Oral BID WC  . Chlorhexidine Gluconate Cloth  6 each Topical Q0600  . collagenase   Topical BID  . darbepoetin (ARANESP) injection - NON-DIALYSIS  100 mcg Subcutaneous Q Sat-1800  . diclofenac Sodium  2 g Topical BID  . famotidine  10 mg Oral Daily  . feeding supplement (ENSURE ENLIVE)  237 mL Oral BID BM  . feeding supplement (PRO-STAT SUGAR FREE 64)  30 mL Per Tube Daily  . fluconazole  200 mg Oral Daily  . gabapentin  100 mg Oral BID  . Gerhardt's butt cream   Topical TID  . heparin  5,000 Units Subcutaneous Q8H  . loratadine  10 mg Oral Daily  . mouth rinse  15 mL Mouth Rinse BID  . methylPREDNISolone (SOLU-MEDROL) injection  100 mg Intravenous Q8H  . neomycin-bacitracin-polymyxin   Topical BID  . nystatin  5 mL Mouth/Throat QID  . pantoprazole (PROTONIX) IV  40 mg Intravenous Q12H  . sertraline  25 mg Oral BID  . sodium chloride flush  3 mL Intravenous Q12H  . sodium chloride irrigation  15 mL Irrigation QID  . thiamine   100 mg Oral Daily   Continuous Infusions: . sodium chloride    . sodium chloride    . sodium chloride    . feeding supplement (NEPRO CARB STEADY) 1,000 mL (08/10/19 2215)  . ondansetron (ZOFRAN) IV     PRN Meds:.sodium chloride, sodium chloride, sodium chloride, acetaminophen (TYLENOL) oral liquid 160 mg/5 mL, ALPRAZolam, alteplase, alum & mag hydroxide-simeth, antiseptic oral rinse, bisacodyl, bisacodyl, camphor-menthol, fentaNYL (SUBLIMAZE) injection, heparin, hydrOXYzine, lidocaine (PF), lidocaine-prilocaine, ondansetron (ZOFRAN) IV, oxyCODONE, pentafluoroprop-tetrafluoroeth, polyethylene glycol, polyvinyl alcohol, senna-docusate, sodium chloride flush, witch hazel-glycerin   Pearson Grippe  MD 08/11/2019, 9:14 AM

## 2019-08-11 NOTE — Progress Notes (Addendum)
Progress Note  [name] Date of Encounter: 08/11/2019  Primary Cardiologist: Evalina Field, MD   Subjective   Episodic dyspnea and chest pain, husband at bedside.   Inpatient Medications    Scheduled Meds: . acetaminophen  500 mg Oral BID  . amLODipine  10 mg Oral Daily  . bisacodyl  10 mg Rectal Daily  . carvedilol  3.125 mg Oral BID WC  . Chlorhexidine Gluconate Cloth  6 each Topical Q0600  . collagenase   Topical BID  . [START ON 08/19/2019] darbepoetin (ARANESP) injection - NON-DIALYSIS  100 mcg Subcutaneous Q Sat-1800  . diclofenac Sodium  2 g Topical BID  . famotidine  10 mg Oral Daily  . feeding supplement (ENSURE ENLIVE)  237 mL Oral BID BM  . feeding supplement (PRO-STAT SUGAR FREE 64)  30 mL Per Tube Daily  . fluconazole  200 mg Oral Daily  . gabapentin  100 mg Oral BID  . Gerhardt's butt cream   Topical TID  . heparin      . heparin  5,000 Units Subcutaneous Q8H  . loratadine  10 mg Oral Daily  . mouth rinse  15 mL Mouth Rinse BID  . methylPREDNISolone (SOLU-MEDROL) injection  100 mg Intravenous Q8H  . neomycin-bacitracin-polymyxin   Topical BID  . nystatin  5 mL Mouth/Throat QID  . pantoprazole (PROTONIX) IV  40 mg Intravenous Q12H  . sertraline  25 mg Oral BID  . sodium chloride flush  3 mL Intravenous Q12H  . sodium chloride irrigation  15 mL Irrigation QID  . thiamine  100 mg Oral Daily   Continuous Infusions: . sodium chloride    . sodium chloride    . sodium chloride    . feeding supplement (NEPRO CARB STEADY) 1,000 mL (08/10/19 2215)  . ondansetron (ZOFRAN) IV     PRN Meds: sodium chloride, sodium chloride, sodium chloride, acetaminophen (TYLENOL) oral liquid 160 mg/5 mL, ALPRAZolam, alteplase, alum & mag hydroxide-simeth, antiseptic oral rinse, bisacodyl, bisacodyl, camphor-menthol, fentaNYL (SUBLIMAZE) injection, heparin, hydrOXYzine, lidocaine (PF), lidocaine-prilocaine, ondansetron (ZOFRAN) IV, oxyCODONE,  pentafluoroprop-tetrafluoroeth, polyethylene glycol, polyvinyl alcohol, senna-docusate, sodium chloride flush, witch hazel-glycerin   Vital Signs    Vitals:   08/11/19 1100 08/11/19 1130 08/11/19 1134 08/11/19 1155  BP: 105/78 (!) 80/58 101/74 118/84  Pulse: (!) 120 (!) 124 (!) 111 99  Resp: (!) 24 (!) 25 20 20   Temp:      TempSrc:   Oral   SpO2:   100%   Weight:      Height:        Intake/Output Summary (Last 24 hours) at 08/11/2019 1348 Last data filed at 08/11/2019 1134 Gross per 24 hour  Intake 418 ml  Output 2802 ml  Net -2384 ml   Last 3 Weights 08/11/2019 08/11/2019 08/10/2019  Weight (lbs) 90 lb 6.2 oz 91 lb 98 lb  Weight (kg) 41 kg 41.277 kg 44.453 kg      Telemetry    Alternating afib with RVR and sinus tach - Personally Reviewed  ECG    1/20, NSR with TWI in lateral leads - Personally Reviewed  Physical Exam   GEN: frail, ill appearing, cachectic Neck: No JVD Cardiac: regular, tachycardic, no murmurs, rubs, or gallops.  Respiratory: anterior exam clear to auscultation bilaterally. GI: Soft, nontender, non-distended  MS: No edema; No deformity. cachectic Neuro:  Nonfocal  Psych: Normal affect   Labs    High Sensitivity Troponin:  No results for input(s): TROPONINIHS in  the last 720 hours.    Chemistry Recent Labs  Lab 08/09/19 0104 08/10/19 0626 08/11/19 0234  NA 129* 133* 131*  K 5.4* 4.0 4.8  CL 92* 95* 92*  CO2 19* 21* 21*  GLUCOSE 121* 118* 140*  BUN 132* 70* 108*  CREATININE 4.12* 2.75* 3.64*  CALCIUM 9.0 9.2 9.3  ALBUMIN 3.0* 3.1* 3.0*  GFRNONAA 10* 17* 12*  GFRAA 12* 19* 14*  ANIONGAP 18* 17* 18*     Hematology Recent Labs  Lab 08/09/19 0722 08/09/19 0722 08/10/19 0626 08/10/19 0630 08/11/19 0234  WBC 1.0*  --  1.5*  --  1.6*  RBC 3.62*   < > 4.32 4.27 4.11  HGB 10.5*  --  12.1  --  11.5*  HCT 34.8*  --  41.7  --  39.8  MCV 96.1  --  96.5  --  96.8  MCH 29.0  --  28.0  --  28.0  MCHC 30.2  --  29.0*  --  28.9*  RDW  20.9*  --  20.8*  --  20.1*  PLT 301  --  251  --  234   < > = values in this interval not displayed.    BNPNo results for input(s): BNP, PROBNP in the last 168 hours.   DDimer No results for input(s): DDIMER in the last 168 hours.   Radiology    No results found.  Cardiac Studies   Echo 07/09/2019 IMPRESSIONS    1. Left ventricular ejection fraction, by visual estimation, is 45 to 50%. The left ventricle has moderately decreased function. There is no left ventricular hypertrophy.  2. The left ventricle demonstrates global hypokinesis.  3. Global right ventricle was not assessed.The right ventricular size is not well visualized. Right vetricular wall thickness was not assessed.  4. Left atrial size was not assessed.  5. Moderate pericardial effusion.  6. The pericardial effusion is circumferential.  7. The mitral valve is grossly normal. Mild mitral valve regurgitation.  8. The tricuspid valve is grossly normal. Tricuspid valve regurgitation moderate-severe.  9. The tricuspid valve was normal in structure. Tricuspid valve regurgitation moderate-severe. 10. Aortic valve regurgitation is moderate to severe. 11. The aortic valve is tricuspid. Aortic valve regurgitation is moderate to severe. 12. Aortic root could not be assessed. 13. Mildly elevated pulmonary artery systolic pressure. 14. The inferior vena cava is normal in size with <50% respiratory variability, suggesting right atrial pressure of 8 mmHg. 15. Limited echo. Effusion appears similar to just slightly enlarged from prior. Aortic regurgitation has been previously evaluated by TEE; incomplete evaluation on this study but cannot exclude severe AR based on images. 16. The interatrial septum was not assessed.   Patient Profile     69 y.o. female with PMH of HTN, OA, pericardial effusion, anasarca 2/2 liver disease, and moderate AI who was sent to the hospital by Dr. Audie Box on 07/03/2019 for worsening dyspnea and atrial  tachycardia with HR 130s. CTA negative PE, but showed moderate pericardial effusion and airspace disease concerning for infection with superimposed edema. Bedside echo demonstrated large pericardial effusion. She underwent IV diuresis, renal function subsequently worsened. PYP scan negative for amyloid. Right heart cath performed on 12/17 showed mild PAH, CO 5.0, CI 3.2. 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 Echo 12/20 showed EF 45-50%, no temponade, moderate to severe AI, enlarging pericardial effusion. She underwent pericardial window by Dr. Wyvonnia Lora 07/12/2019. Repeat echo 12/28 EF 50-55%, mid MR,  moderate AI. Worsening renal function (Cr 0.67 on arrival, 5.1 by 12/29) resulted in initiation of dialysis on 12/29. Tunneled HD catheter placed 12/29. G tube placed. Thoracentesis performed on 1/12 due to worsening dyspnea, 1100 ml light amber pleural fluid removed. Started on steroid by palliative care to stimulate appetite and reduce inflammatory pain and possibly undiagnosed rheumatological disorder. She had an episode of unresponsiveness on 1/14 after dialysis and second dose of ativan, this quickly resolved and felt to be overmedication.  Assessment & Plan    1. PAF with RVR  - likely occurred in the setting of multiple stress test. Given the need for HD, only option for anticoagulation would be coumadin. She would not be a good candidate for coumadin given recent subxiphoid window and multiple other issues.   - telemetry indicate patient is actively converting in and out of afib. Will focus more on rate control strategy, if BP unable to tolerate, even consider rhythm control (given her liver disease may not be a good candidate for amiodarone).  - she is currently on amlodipine.   Will discuss with MD, consider switch coreg to metoprolol for better rate control without strong BP lowering effect. Also may consider lower amlodipine down to  76m daily.   2. Anasarca/pericardial effusion   - negative [facility]YP scan for amyloid[facility]  - Right heart cath performed on 12/17 showed mild PAH, CO 5.0, CI 3.2. 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   - Echo 12/20 showed EF 45-50%, no temponade, moderate to severe AI, enlarging pericardial effusion.  - s/p pericardial window by Dr. LKipp Brood12/23/2020  - anasarca related to combination of liver disease and severe hypoalbuminemia. 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 Dr. GHilma [facility]discuss[facility]. Initially planned to transfer to a tertiary center where she can receive acute inpatient rheumatology service, [facility]. MJana Hakimdid not recommend further search for malignancy given extensive imaging during this admission. Additional screening as outpatient.  3. AKI  - Worsening renal function (Cr 0.67 on arrival, 5.1 by 12/29) resulted in initiation of dialysis on 12/29. Tunneled HD catheter placed 12/29.  4. Anemia: stable.   5. Malnutrition: severe malnourishment. G tube feeding. Only weights 41 kg, BMP 14.  6. Moderate AI: seen on last echo     For questions or updates, please contact CForbesPlease consult [email or web address] for contact info under      Signed, HAlmyra Deforest PSumpter 08/11/2019, 1:48 PM     Patient seen and examined.  Agree with above documentation. On exam, patient is cachectic, drowsy (had just taken Xanax), regular rate and rhythm, no murmurs, lungs CTAB, no LE edema or JVD.  Review of  telemetry shows intermittent AF with RVR, rates up to 150, resolves spontaneously to sinus rhythm with rate in 90s.  Currently in sinus rhythm.    For her AF, would switch carvedilol to metoprolol for rate control given her soft pressures today while in AF.  Will start metoprolol 25 mg BID and can titrate as needed for rate control.  Given soft pressures, would hold amlodipine so there is  room to titrate rate control.  She is not a good candidate for anticoagulation given comorbidities and frailty.  Rhythm control strategy could be considered if having more issues with AF, but likely not a good amiodarone candidate given her liver disease.  Donato Heinz, MD

## 2019-08-12 LAB — CBC
HCT: 42.9 % (ref 36.0–46.0)
Hemoglobin: 12.4 g/dL (ref 12.0–15.0)
MCH: 27.4 pg (ref 26.0–34.0)
MCHC: 28.9 g/dL — ABNORMAL LOW (ref 30.0–36.0)
MCV: 94.9 fL (ref 80.0–100.0)
Platelets: 252 10*3/uL (ref 150–400)
RBC: 4.52 MIL/uL (ref 3.87–5.11)
RDW: 20.8 % — ABNORMAL HIGH (ref 11.5–15.5)
WBC: 4.2 10*3/uL (ref 4.0–10.5)
nRBC: 0.7 % — ABNORMAL HIGH (ref 0.0–0.2)

## 2019-08-12 LAB — GLUCOSE, CAPILLARY
Glucose-Capillary: 144 mg/dL — ABNORMAL HIGH (ref 70–99)
Glucose-Capillary: 112 mg/dL — ABNORMAL HIGH (ref 70–99)
Glucose-Capillary: 150 mg/dL — ABNORMAL HIGH (ref 70–99)
Glucose-Capillary: 144 mg/dL — ABNORMAL HIGH (ref 70–99)
Glucose-Capillary: 133 mg/dL — ABNORMAL HIGH (ref 70–99)

## 2019-08-12 LAB — RENAL FUNCTION PANEL
Albumin: 3.1 g/dL — ABNORMAL LOW (ref 3.5–5.0)
Anion gap: 17 — ABNORMAL HIGH (ref 5–15)
BUN: 79 mg/dL — ABNORMAL HIGH (ref 8–23)
CO2: 20 mmol/L — ABNORMAL LOW (ref 22–32)
Calcium: 9.3 mg/dL (ref 8.9–10.3)
Chloride: 96 mmol/L — ABNORMAL LOW (ref 98–111)
Creatinine, Ser: 2.71 mg/dL — ABNORMAL HIGH (ref 0.44–1.00)
GFR calc Af Amer: 20 mL/min — ABNORMAL LOW (ref >60)
GFR calc non Af Amer: 17 mL/min — ABNORMAL LOW (ref >60)
Glucose, Bld: 148 mg/dL — ABNORMAL HIGH (ref 70–99)
Phosphorus: 4.6 mg/dL (ref 2.5–4.6)
Potassium: 4.5 mmol/L (ref 3.5–5.1)
Sodium: 133 mmol/L — ABNORMAL LOW (ref 135–145)

## 2019-08-12 MED ORDER — GABAPENTIN 250 MG/5ML PO SOLN
100.0000 mg | Freq: Two times a day (BID) | ORAL | Status: DC
  Administered 2019-08-12 – 2019-08-23 (×22): 100 mg
  Filled 2019-08-12 (×25): qty 2

## 2019-08-12 NOTE — Progress Notes (Signed)
[facility]  PROGRESS NOTE  Joanna Ali OVZ:[id number] DOB: 15-Mar-1951 DOA: 07/03/2019 PCP: Renaldo Reel, DO   Brief HPI:   69 year old female with a history of hypertension, chronic pericardial effusion, moderate aortic valve regurgitation, former smoker, alcohol abuse initially admitted on 07/06/2019 for progressive shortness of breath with anasarca and fatigue.  She was previously worked up by [facility] rheumatology with negative work-up including ESR, lupus anticoagulant, rheumatoid factor, TSH, ANCA, ANA, light free chain studies.  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  Since that time patient had stopped drinking alcohol as advised.  She has been on diuretics and spironolactone without any improvement in symptoms.  CT of the chest on 07/04/2019 noted moderate pericardial effusion and airspace disease.  She underwent thoracentesis by PCCM which removed 700 mL of the fluid.  Patient underwent right heart cath on 07/06/2019 and pericardial window on 07/12/2019.  Attempts were made to diurese with IV diuretics but patient with worsening kidney function with creatinine up to 5.  Nephrology was formally consulted patient was started on dialysis.  She has been having poor p.o. intake so code tract was placed.  She was then later placed on PEG tube for persistent poor p.o. intake, also patient developed oral pain after NG tube placement.  She is on hemodialysis for AKI with rapid progression to end-stage renal disease, severe anasarca, pericardial effusion, status post pericardial window.  She was recently started on empiric steroids which will be weaned off.  Her labs showed RF positive at 71, INT RNA polymerase antibody +146 concern for scleroderma also she had some thickening of the skin with renal failure and pericardial involvement.  Pulmonary  was consulted to evaluate for ILD.  Dr. Regular diet discussed with rheumatology at [facility] who recommended supportive care, acid reflux treated with PPI, joint pain Voltaren gel and Tylenol.  Prednisone taper.  For Raynaud's phenomenon calcium channel blocker.  If patient has interstitial lung disease then she should be evaluated for immunotherapy can be as outpatient when stronger.  Rheumatology did not recommend immunotherapy for just joint pain and skin manifestation.    Subjective   Patient seen and examined, appears more alert.  Denies chest pain or shortness of breath.   Assessment/Plan:     1. ?  Scleroderma /anasarca/joint pain/pericardial effusion-patient presented with anasarca, failure to thrive, joint pains, pericardial effusion, Raynaud's phenomenon.  She had positive anti-RNA polymerase 3 antibody.  She was started on IV steroids to treat empirically for rheumatologic etiology.  Rheumatology evaluation in September 2020 showed CRP 13, ESR 25, ANA negative, c-ANCA/p-ANCA negative, scleroderma SCL 70 -, SSS (ro) negative, M spike negative electrophoresis, HIV negative.  Double-stranded DNA negative, ANA negative, RF 71.  Patient received IV Solu-Medrol 125 mg for 3 days then changed to 50 mg p.o. daily.  R[facility]umatology at [facility] recommended quick taper as we were considering scleroderma.  [place] do not have bed availability at this time.  Dr. Hilma Favors discussed with rheumatologist Dr. Kathlene November, who recommended high-dose steroids, patient started on Solu-Medrol 125 mg IV for 3 days then switch to  prednisone 60 mg daily for 2 weeks and then slow taper.  2. Atrial fibrillation RVR-patient is on Coreg.  Coreg was held this morning before dialysis, just given 30 minutes ago.  Heart rate ranging from 90s to 180s.  If Coreg does not work  she might need Cardizem infusion.  Patient is not a good candidate for anticoagulation considering multiple comorbid conditions, pericardial  window.  Cardiology has been following patient since her hospitalization.  Appreciate cardiology recommendations.  No anticoagulation at this time.  Heart rate is controlled on metoprolol 25 mg p.o. twice daily  3. ?  Malignancy - Patient developed leukopenia, oncology was consulted.  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 Jana Hakim saw the patient and does not recommend further search for malignancy.  As patient had extensive imaging this admission with no malignancy identified, CT chest noncontrast yesterday was negative, CT abdomen/pelvis on 1/16 was unremarkable and repeat SPEP showed no monoclonal protein with normal kappa lambda ratio.  She also had negative pleural fluid cytology x4.  Screening malignancy can be done as  Outpatient.  4. ESRD, new on HD-patient has progressively AKI found to be exacerbated by contrast-induced nephropathy.  She was started on hemodialysis with improvement of anasarca.  Outpatient dialysis has been set up.  5. Lethargy/encephalopathy-resolved, patient became lethargic and hemodialysis on 08/02/2019 she was receiving Ativan at that time.  Fentanyl was stopped, opiates were decreased.  Ammonia level 29, she received IV thiamine 250 mg 3 times daily for 3 days.  Thiamine level is 192.5.  6. Acute hypoxic respiratory failure/pulmonary edema/pleural effusion-she was started on IV Solu-Medrol by palliative care for presumed autoimmune process.  She had worsening dyspnea with pleural effusion, she underwent repeat thoracentesis on 08/01/2019.  Removed 1.1 L from left and 500 mL from right.  Pleural cytology showed reactive mesothelial cells.  Pleural fluid culture showed no growth.  PCCM was consulted for possible ILD.  7. Pericardial effusion with elevated troponin-patient is status post pericardial window on 07/12/2019.  Pathology showed no malignancy, chronic inflammation, AFB negative.  Fungal culture came back negative.  8. Atypical pneumonia with  hypoxia-completed treatment.  9. Severe malnutrition/failure to thrive-continue with tube feeding.  Speech therapy consulted, and patient started on regular diet.  10. Dysphagia/thrush-group A strep was negative on 07/28/2019, patient currently on oral nystatin, oral Diflucan.  11. Anxiety-continue Zoloft, started on Xanax 0.25 mg 3 times daily as needed for panic attack.    12. Back pain-continue with oxycodone as needed, Voltaren gel twice a day.  13. Hypertension-continue Coreg, Norvasc.    SpO2: 99 % O2 Flow Rate (L/min): 2 L/min     Lab Results  Component Value Date   SARSCOV2NAA NEGATIVE 07/04/2019   [place] NEGATIVE 07/03/2019   [place] NEGATIVE 04/04/2019   SARSCOV2NAA NOT DETECTED 03/25/2019     CBG: Recent Labs  Lab 08/11/19 2011 08/11/19 2342 08/12/19 0333 08/12/19 0722 08/12/19 1056  GLUCAP 92 127* 144* 112* 150*    CBC: Recent Labs  Lab 08/08/19 1052 08/09/19 0722 08/10/19 0626 08/11/19 0234 08/12/19 0441  WBC 1.4* 1.0* 1.5* 1.6* 4.2  NEUTROABS 0.6*  --  0.6*  --   --   HGB 10.9* 10.5* 12.1 11.5* 12.4  HCT 36.8 34.8* 41.7 39.8 42.9  MCV 96.1 96.1 96.5 96.8 94.9  PLT 291 301 251 234 481    Basic Metabolic Panel: Recent Labs  Lab 08/08/19 0327 08/08/19 0327 08/08/19 1842 08/09/19 0104 08/10/19 0626 08/11/19 0234 08/12/19 0441  NA 133*   < > 132* 129* 133* 131* 133*  K 5.3*   < > 5.0 5.4* 4.0 4.8 4.5  CL 96*   < > 95* 92* 95* 92* 96*  CO2 20*   < > 21* 19* 21* 21* 20*  GLUCOSE 114*   < > 95 121* 118* 140* 148*  BUN 99*   < > 123* 132* 70* 108* 79*  CREATININE 3.40*   < > 3.92* 4.12* 2.75* 3.64* 2.71*  CALCIUM 9.1   < > 9.3 9.0 9.2 9.3 9.3  PHOS 4.6  --   --  5.2* 4.5 5.8* 4.6   < > = values in this interval not displayed.     Liver Function Tests: Recent Labs  Lab 08/08/19 0327 08/09/19 0104 08/10/19 0626 08/11/19 0234 08/12/19 0441  ALBUMIN 3.3* 3.0* 3.1* 3.0* 3.1*        DVT prophylaxis: Heparin  Code  Status: Full code  Family Communication: No family at bedside  Disposition Plan: likely skilled nursing facility versus CIR  Pressure Injury 07/19/19 Sacrum Posterior;Mid Stage 2 -  Partial thickness loss of dermis presenting as a shallow open injury with a red, pink wound bed without slough. (Active)  07/19/19 0600  Location: Sacrum  Location Orientation: Posterior;Mid  Staging: Stage 2 -  Partial thickness loss of dermis presenting as a shallow open injury with a red, pink wound bed without slough.  Wound Description (Comments):   Present on Admission: No     Pressure Injury 07/22/19 Nare Right;Left Stage 2 -  Partial thickness loss of dermis presenting as a shallow open injury with a red, pink wound bed without slough. Injury r/t cortrak (Active)  07/22/19 1000  Location: Nare  Location Orientation: Right;Left  Staging: Stage 2 -  Partial thickness loss of dermis presenting as a shallow open injury with a red, pink wound bed without slough.  Wound Description (Comments): Injury r/t cortrak  Present on Admission: No        Scheduled medications:  . acetaminophen  500 mg Oral BID  . bisacodyl  10 mg Rectal Daily  . Chlorhexidine Gluconate Cloth  6 each Topical Q0600  . collagenase   Topical BID  . [START ON 08/19/2019] darbepoetin (ARANESP) injection - NON-DIALYSIS  100 mcg Subcutaneous Q Sat-1800  . diclofenac Sodium  2 g Topical BID  . famotidine  10 mg Oral Daily  . feeding supplement (ENSURE ENLIVE)  237 mL Oral BID BM  . feeding supplement (PRO-STAT SUGAR FREE 64)  30 mL Per Tube Daily  . gabapentin  100 mg Per Tube Q12H  . Gerhardt's butt cream   Topical TID  . heparin  5,000 Units Subcutaneous Q8H  . loratadine  10 mg Oral Daily  . mouth rinse  15 mL Mouth Rinse BID  . methylPREDNISolone (SOLU-MEDROL) injection  100 mg Intravenous Q8H  . metoprolol tartrate  25 mg Oral BID  . neomycin-bacitracin-polymyxin   Topical BID  . nystatin  5 mL Mouth/Throat QID  .  pantoprazole (PROTONIX) IV  40 mg Intravenous Q12H  . sertraline  25 mg Oral BID  . sodium chloride flush  3 mL Intravenous Q12H  . sodium chloride irrigation  15 mL Irrigation QID  . thiamine  100 mg Oral Daily    Consultants:  Oncology  Nephrology  Palliative care  Procedures:    Antibiotics:   Anti-infectives (From admission, onward)   Start     Dose/Rate Route Frequency Ordered Stop   07/29/19 1500  fluconazole (DIFLUCAN) tablet 200 mg     200 mg Oral Daily 07/29/19 1458 08/11/19 2116   07/28/19 1300  ceFAZolin (ANCEF) 2-4 GM/100ML-% IVPB    Note to Pharmacy: Kandy Garrison   : cabinet override  07/28/19 1300 07/28/19 1806   07/28/19 0700  ceFAZolin (ANCEF) IVPB 2g/100 mL premix     2 g 200 mL/hr over 30 Minutes Intravenous On call 07/27/19 1502 07/28/19 1340   07/18/19 1316  ceFAZolin (ANCEF) 2-4 GM/100ML-% IVPB    Note to Pharmacy: Manuela Neptune   : cabinet override      07/18/19 1316 07/18/19 1318   07/12/19 2115  ceFAZolin (ANCEF) IVPB 1 g/50 mL premix     1 g 100 mL/hr over 30 Minutes Intravenous Every 8 hours 07/12/19 2102 07/13/19 0516   07/03/19 2245  cefTRIAXone (ROCEPHIN) 2 g in sodium chloride 0.9 % 100 mL IVPB  Status:  Discontinued     2 g 200 mL/hr over 30 Minutes Intravenous Every 24 hours 07/03/19 2230 07/04/19 1336   07/03/19 2245  azithromycin (ZITHROMAX) 500 mg in sodium chloride 0.9 % 250 mL IVPB  Status:  Discontinued     500 mg 250 mL/hr over 60 Minutes Intravenous Every 24 hours 07/03/19 2230 07/05/19 1037       Objective   Vitals:   08/12/19 0353 08/12/19 0400 08/12/19 0806 08/12/19 1058  BP: 126/78  131/84 112/75  Pulse: 86 81 91 80  Resp: (!) 27 20 (!) 21 (!) 21  Temp: 97.9 F (36.6 C)  97.8 F (36.6 C) 97.7 F (36.5 C)  TempSrc: Oral  Oral Axillary  SpO2: 100% 100% 100% 99%  Weight: 42.2 kg     Height:        Intake/Output Summary (Last 24 hours) at 08/12/2019 1242 Last data filed at 08/12/2019 1200 Gross per 24  hour  Intake 2004 ml  Output --  Net 2004 ml    01/21 1901 - 01/23 0700 In: 1621.5 [P.O.:15; I.V.:6] Out: 2802   Filed Weights   08/11/19 0601 08/11/19 0738 08/12/19 0353  Weight: 41.3 kg 41 kg 42.2 kg    Physical Examination:   General-appears in no acute distress Heart-S1-S2, regular, no murmur auscultated Lungs-clear to auscultation bilaterally, no wheezing or crackles auscultated Abdomen-soft, nontender, no organomegaly Extremities-no edema in the lower extremities Neuro-alert, oriented x3, no focal deficit noted  Data Reviewed:   No results found for this or any previous visit (from the past 240 hour(s)).  No results for input(s): LIPASE, AMYLASE in the last 168 hours. No results for input(s): AMMONIA in the last 168 hours.  Cardiac Enzymes: No results for input(s): CKTOTAL, CKMB, CKMBINDEX, TROPONINI in the last 168 hours. BNP (last 3 results) Recent Labs    04/04/19 2000 07/03/19 1930 07/09/19 0347  BNP 76.7 1,124.1* 4,004.9*     Studies:  No results found.   Admission status: Inpatient: Based on patients clinical presentation and evaluation of above clinical data, I have made determination that patient meets Inpatient criteria at this time.   Oswald Hillock   [facility] If 7PM-7AM, please contact night-coverage at [email or web address], Office  [phone]  password [email or web address]  08/12/2019, 12:42 PM  LOS: 39 days

## 2019-08-12 NOTE — Progress Notes (Signed)
[facility] Progress Note    Assessment/ Plan:   Pt is a 69 y.o. yo female  with history of HTN,  hepatic steatosis with low albumin, chronic pericardial effusion, moderate AR admitted on 12/17 for progressive dyspnea, anasarca , seen as a consultationfor AKIand fluid overload.  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  Autoimmune work-up was unremarkable initially but now some consideration of scleroderma, need rheum eval formally.   Started HD 07/18/19 -  TDC.  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  Starting CLIP process.  Long way to go for outpt HD.  Tentatively has a spot in [place].  Cont on MWF: sig azotemia so longer Tx: 4h, 2-3L, 2K, Qb 400/800, TDC L IJ  #Hyperkalemia:  Has been having intermittent hyperK.  Try to avoid lokelma as it irritated throat. -Stable currently  #Anasarca presumably due to liver disease which was diagnosed at [facility]. She has a history of liver steatosis.No evidence of GN/nephrotic syndrome. 3rd spacing also. volume status much improved but is still an issue- UF as able   #Moderate to large pericardial effusion:  s/p pericardial window on 12/23- TCTS following, stable  #Hypertension: Stable  Cont UF as able . #Anemia: s/p transfusion on 12/22.  s/p feraheme and on ESA weekly.  ESA this week was held as hemoglobin greater than 12  #Hyperphosphatemia:  Normalized, no binder  **Malnutrition:  S/p G tube 1/8 -  Albumin low  Dispo:    Palliative following,    Subjective:    HD yesterday, 2.8 L ultrafiltration Remains on high-dose Solu-Medrol On room air, vital signs are stable Cardiology following for atrial fibrillation   Objective:    BP 112/75 (BP Location: Left Arm)   Pulse 80   Temp 97.7 F (36.5 C) (Axillary)   Resp (!) 21   Ht 5' 6"  (1.676 m)   Wt 42.2 kg   SpO2 99%   BMI 15.01 kg/m   Intake/Output Summary (Last 24 hours) at 08/12/2019 1121 Last data filed at 08/12/2019 0915 Gross per 24 hour  Intake 1824 ml  Output 2802 ml  Net -978 ml   Weight change: -0.277 kg  Physical Exam: Gen: cachectic, -  NAD CVS: RRR, well-healing scars Resp: normal WOB Abd: thin, mildly painful to palpation Ext: + flank edema ACCESS: L TDC c/d/i  Imaging: No results found.  Labs: BMET Recent Labs  Lab 08/06/19 0253 08/06/19 0253 08/07/19 4332 08/07/19 0854 08/07/19 1417 08/08/19 0327 08/08/19 1842 08/09/19 0104 08/10/19 0626 08/11/19 0234 08/12/19 0441  NA 129*   < > 129*  --   --  133* 132* 129* 133* 131* 133*  K 5.3*   < > 6.1*  --   --  5.3* 5.0 5.4* 4.0 4.8 4.5  CL 90*   < > 88*  --   --  96* 95* 92* 95* 92* 96*  CO2 21*   < > 21*  --   --  20* 21* 19* 21* 21* 20*  GLUCOSE 137*   < > 135*   < > 122* 114* 95 121* 118* 140* 148*  BUN 121*   < > 165*  --   --  99* 123* 132* 70* 108* 79*  CREATININE 3.95*   < > 5.05*  --   --  3.40* 3.92* 4.12* 2.75* 3.64* 2.71*  CALCIUM 9.1   < > 9.6  --   --  9.1 9.3 9.0 9.2 9.3 9.3  PHOS 6.3*  --  6.3*  --   --  4.6  --  5.2* 4.5 5.8* 4.6   < > = values in this interval not displayed.   CBC Recent Labs  Lab 08/08/19 1052 08/08/19 1052 08/09/19 0722 08/10/19 0626 08/11/19 0234 08/12/19 0441  WBC 1.4*   < > 1.0* 1.5* 1.6* 4.2  NEUTROABS 0.6*  --   --  0.6*  --   --   HGB 10.9*   < > 10.5* 12.1 11.5* 12.4  HCT 36.8   < > 34.8* 41.7 39.8 42.9  MCV 96.1   < > 96.1 96.5 96.8 94.9  PLT 291   < > 301 251 234 252   < > = values in this interval not displayed.    Medications:    . acetaminophen  500 mg Oral BID  . bisacodyl  10 mg Rectal Daily  . Chlorhexidine Gluconate Cloth  6 each Topical Q0600  . collagenase   Topical BID  . [START ON 08/19/2019]  darbepoetin (ARANESP) injection - NON-DIALYSIS  100 mcg Subcutaneous Q Sat-1800  . diclofenac Sodium  2 g Topical BID  . famotidine  10 mg Oral Daily  . feeding supplement (ENSURE ENLIVE)  237 mL Oral BID BM  . feeding supplement (PRO-STAT SUGAR FREE 64)  30 mL Per Tube Daily  . gabapentin  100 mg Per Tube Q12H  . Gerhardt's butt cream   Topical TID  . heparin  5,000 Units Subcutaneous Q8H  . loratadine  10 mg Oral Daily  . mouth rinse  15 mL Mouth Rinse BID  . methylPREDNISolone (SOLU-MEDROL) injection  100 mg Intravenous Q8H  . metoprolol tartrate  25 mg Oral BID  . neomycin-bacitracin-polymyxin   Topical BID  . nystatin  5 mL Mouth/Throat QID  . pantoprazole (PROTONIX) IV  40 mg Intravenous Q12H  . sertraline  25 mg Oral BID  . sodium chloride flush  3 mL Intravenous Q12H  . sodium chloride irrigation  15 mL Irrigation QID  . thiamine  100 mg Oral Daily      [facility]  08/12/2019, 11:21 AM

## 2019-08-12 NOTE — Progress Notes (Signed)
Progress Note  [name] Date of Encounter: 08/12/2019  Primary Cardiologist: Evalina Field, MD   Subjective  Patient cannot get comfortable today, we have tried several adjustments in her hospital bed. Chest discomfort that occurs during dialysis, but is also the same discomfort that brought her into the hospital for volume management.  Inpatient Medications    Scheduled Meds: . acetaminophen  500 mg Oral BID  . bisacodyl  10 mg Rectal Daily  . Chlorhexidine Gluconate Cloth  6 each Topical Q0600  . collagenase   Topical BID  . [START ON 08/19/2019] darbepoetin (ARANESP) injection - NON-DIALYSIS  100 mcg Subcutaneous Q Sat-1800  . diclofenac Sodium  2 g Topical BID  . famotidine  10 mg Oral Daily  . feeding supplement (ENSURE ENLIVE)  237 mL Oral BID BM  . feeding supplement (PRO-STAT SUGAR FREE 64)  30 mL Per Tube Daily  . gabapentin  100 mg Oral BID  . Gerhardt's butt cream   Topical TID  . heparin  5,000 Units Subcutaneous Q8H  . loratadine  10 mg Oral Daily  . mouth rinse  15 mL Mouth Rinse BID  . methylPREDNISolone (SOLU-MEDROL) injection  100 mg Intravenous Q8H  . metoprolol tartrate  25 mg Oral BID  . neomycin-bacitracin-polymyxin   Topical BID  . nystatin  5 mL Mouth/Throat QID  . pantoprazole (PROTONIX) IV  40 mg Intravenous Q12H  . sertraline  25 mg Oral BID  . sodium chloride flush  3 mL Intravenous Q12H  . sodium chloride irrigation  15 mL Irrigation QID  . thiamine  100 mg Oral Daily   Continuous Infusions: . sodium chloride    . sodium chloride    . sodium chloride    . feeding supplement (NEPRO CARB STEADY) 1,000 mL (08/12/19 0334)  . ondansetron (ZOFRAN) IV     PRN Meds: sodium chloride, sodium chloride, sodium chloride, acetaminophen (TYLENOL) oral liquid 160 mg/5 mL, ALPRAZolam, alteplase, alum & mag hydroxide-simeth, antiseptic oral rinse, bisacodyl, bisacodyl, camphor-menthol, fentaNYL (SUBLIMAZE) injection, heparin, hydrOXYzine,  lidocaine (PF), lidocaine-prilocaine, ondansetron (ZOFRAN) IV, oxyCODONE, pentafluoroprop-tetrafluoroeth, polyethylene glycol, polyvinyl alcohol, senna-docusate, sodium chloride flush, witch hazel-glycerin   Vital Signs    Vitals:   08/11/19 2112 08/11/19 2343 08/12/19 0353 08/12/19 0400  BP: 120/80 117/74 126/78   Pulse: 87 80 86 81  Resp:  (!) 23 (!) 27 20  Temp:  (!) 97.4 F (36.3 C) 97.9 F (36.6 C)   TempSrc:  Oral Oral   SpO2:  100% 100% 100%  Weight:   42.2 kg   Height:        Intake/Output Summary (Last 24 hours) at 08/12/2019 1791 Last data filed at 08/12/2019 0400 Gross per 24 hour  Intake 1618.5 ml  Output 2802 ml  Net -1183.5 ml   Last 3 Weights 08/12/2019 08/11/2019 08/11/2019  Weight (lbs) 93 lb 90 lb 6.2 oz 91 lb  Weight (kg) 42.185 kg 41 kg 41.277 kg      Telemetry   Sinus rhythm with PACs and atrial flutter- Personally Reviewed  ECG    1/20, NSR with TWI in lateral leads - Personally Reviewed  Physical Exam  Constitutional: Frail and cachectic, vocal weakness, overall in no distress but cannot get comfortable Cardiovascular: regular rhythm, normal rate, no murmurs. S1 and S2 normal. Radial pulses normal bilaterally. No jugular venous distention.  Respiratory: Diminished in the bases GI : normal bowel sounds, soft and nontender. No distention.   MSK: extremities thin without  edema.  NEURO: grossly nonfocal exam, moves all extremities. PSYCH: alert and oriented x 3, normal mood and affect.   Labs    High Sensitivity Troponin:  No results for input(s): TROPONINIHS in the last 720 hours.    Chemistry Recent Labs  Lab 08/10/19 0626 08/11/19 0234 08/12/19 0441  NA 133* 131* 133*  K 4.0 4.8 4.5  CL 95* 92* 96*  CO2 21* 21* 20*  GLUCOSE 118* 140* 148*  BUN 70* 108* 79*  CREATININE 2.75* 3.64* 2.71*  CALCIUM 9.2 9.3 9.3  ALBUMIN 3.1* 3.0* 3.1*  GFRNONAA 17* 12* 17*  GFRAA 19* 14* 20*  ANIONGAP 17* 18* 17*     Hematology Recent Labs  Lab  08/10/19 0626 08/10/19 0630 08/11/19 0234 08/12/19 0441  WBC 1.5*  --  1.6* 4.2  RBC 4.32 4.27 4.11 4.52  HGB 12.1  --  11.5* 12.4  HCT 41.7  --  39.8 42.9  MCV 96.5  --  96.8 94.9  MCH 28.0  --  28.0 27.4  MCHC 29.0*  --  28.9* 28.9*  RDW 20.8*  --  20.1* 20.8*  PLT 251  --  234 252    BNPNo results for input(s): BNP, PROBNP in the last 168 hours.   DDimer No results for input(s): DDIMER in the last 168 hours.   Radiology    No results found.  Cardiac Studies   Echo 07/09/2019 IMPRESSIONS    1. Left ventricular ejection fraction, by visual estimation, is 45 to 50%. The left ventricle has moderately decreased function. There is no left ventricular hypertrophy.  2. The left ventricle demonstrates global hypokinesis.  3. Global right ventricle was not assessed.The right ventricular size is not well visualized. Right vetricular wall thickness was not assessed.  4. Left atrial size was not assessed.  5. Moderate pericardial effusion.  6. The pericardial effusion is circumferential.  7. The mitral valve is grossly normal. Mild mitral valve regurgitation.  8. The tricuspid valve is grossly normal. Tricuspid valve regurgitation moderate-severe.  9. The tricuspid valve was normal in structure. Tricuspid valve regurgitation moderate-severe. 10. Aortic valve regurgitation is moderate to severe. 11. The aortic valve is tricuspid. Aortic valve regurgitation is moderate to severe. 12. Aortic root could not be assessed. 13. Mildly elevated pulmonary artery systolic pressure. 14. The inferior vena cava is normal in size with <50% respiratory variability, suggesting right atrial pressure of 8 mmHg. 15. Limited echo. Effusion appears similar to just slightly enlarged from prior. Aortic regurgitation has been previously evaluated by TEE; incomplete evaluation on this study but cannot exclude severe AR based on images. 16. The interatrial septum was not assessed.   Patient Profile      69 y.o. female with PMH of HTN, OA, pericardial effusion, anasarca 2/2 liver disease, and moderate AI who was sent to the hospital by Dr. Audie Box on 07/03/2019 for worsening dyspnea and atrial tachycardia with HR 130s. CTA negative PE, but showed moderate pericardial effusion and airspace disease concerning for infection with superimposed edema. Bedside echo demonstrated large pericardial effusion. She underwent IV diuresis, renal function subsequently worsened. PYP scan negative for amyloid. Right heart cath performed on 12/17 showed mild PAH, CO 5.0, CI 3.2. 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 Echo 12/20 showed EF 45-50%, no temponade, moderate to severe AI, enlarging pericardial effusion. She underwent pericardial window by Dr. Wyvonnia Lora 07/12/2019. Repeat echo 12/28 EF 50-55%, mid MR, moderate AI. Worsening renal function (Cr  0.67 on arrival, 5.1 by 12/29) resulted in initiation of dialysis on 12/29. Tunneled HD catheter placed 12/29. G tube placed. Thoracentesis performed on 1/12 due to worsening dyspnea, 1100 ml light amber pleural fluid removed. Started on steroid by palliative care to stimulate appetite and reduce inflammatory pain and possibly undiagnosed rheumatological disorder. She had an episode of unresponsiveness on 1/14 after dialysis and second dose of ativan, this quickly resolved and felt to be overmedication.  Assessment & Plan    1. PAF with RVR  - likely occurred in the setting of multiple stress test. Given the need for HD, only option for anticoagulation would be coumadin. She would not be a good candidate for coumadin given recent subxiphoid window and multiple other issues.   - telemetry indicate patient is actively converting in and out of afib/flutter. Will focus more on rate control strategy, if BP unable to tolerate, even consider rhythm control (given her liver disease may not be a good candidate for  amiodarone). -  on metoprolol 25 mg BID and holding amlodipine. BP stable, rates on tele stable aside from an episode of rapid atrial flutter yesterday afternoon.  2. Anasarca/pericardial effusion   - negative PYP scan for amyloid. Liver biopsy at [facility] showed mild hepatic steatosis. Negative EGD biopsy for amyloidosis at [facility].  - Right heart cath performed on 12/17 showed mild PAH, CO 5.0, CI 3.2. 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   - Echo 12/20 showed EF 45-50%, no temponade, moderate to severe AI, enlarging pericardial effusion.  - s/p pericardial window by Dr. Kipp Brood 07/12/2019  - anasarca related to combination of liver disease and severe hypoalbuminemia. 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 Dr. Hilma Favors discussed with Rheumatology at [facility], who recommended high dose steroid. Initially planned to transfer to a tertiary center where she can receive acute inpatient rheumatology service, however [facility] available at [facility] and [facility]. Dr. Jana Hakim did not recommend further search for malignancy given extensive imaging during this admission. Additional screening as outpatient.  3. AKI  - Worsening renal function (Cr 0.67 on arrival, 5.1 by 12/29) resulted in initiation of dialysis on 12/29. Tunneled HD catheter placed 12/29.  4. Anemia: stable.   5. Malnutrition: severe malnourishment. G tube feeding. Only weights 42 kg, BMI 15.  6. Moderate AI: seen on last echo     For questions or updates, please contact [place] Please consult [email or web address] for contact info under      Signed, Elouise Munroe, MD  08/12/2019, 6:19 AM

## 2019-08-12 NOTE — Plan of Care (Signed)

## 2019-08-13 LAB — RENAL FUNCTION PANEL
Albumin: 3.2 g/dL — ABNORMAL LOW (ref 3.5–5.0)
Anion gap: 20 — ABNORMAL HIGH (ref 5–15)
BUN: 153 mg/dL — ABNORMAL HIGH (ref 8–23)
CO2: 21 mmol/L — ABNORMAL LOW (ref 22–32)
Calcium: 9.7 mg/dL (ref 8.9–10.3)
Chloride: 92 mmol/L — ABNORMAL LOW (ref 98–111)
Creatinine, Ser: 3.78 mg/dL — ABNORMAL HIGH (ref 0.44–1.00)
GFR calc Af Amer: 13 mL/min — ABNORMAL LOW (ref >60)
GFR calc non Af Amer: 11 mL/min — ABNORMAL LOW (ref >60)
Glucose, Bld: 130 mg/dL — ABNORMAL HIGH (ref 70–99)
Phosphorus: 5.7 mg/dL — ABNORMAL HIGH (ref 2.5–4.6)
Potassium: 5.6 mmol/L — ABNORMAL HIGH (ref 3.5–5.1)
Sodium: 133 mmol/L — ABNORMAL LOW (ref 135–145)

## 2019-08-13 LAB — GLUCOSE, CAPILLARY
Glucose-Capillary: 101 mg/dL — ABNORMAL HIGH (ref 70–99)
Glucose-Capillary: 104 mg/dL — ABNORMAL HIGH (ref 70–99)
Glucose-Capillary: 105 mg/dL — ABNORMAL HIGH (ref 70–99)
Glucose-Capillary: 111 mg/dL — ABNORMAL HIGH (ref 70–99)
Glucose-Capillary: 124 mg/dL — ABNORMAL HIGH (ref 70–99)
Glucose-Capillary: 84 mg/dL (ref 70–99)
Glucose-Capillary: 92 mg/dL (ref 70–99)

## 2019-08-13 MED ORDER — SODIUM CHLORIDE 0.9 % IV SOLN: 100.0000 mL | INTRAVENOUS | Status: DC | PRN

## 2019-08-13 MED ORDER — HEPARIN SODIUM (PORCINE) 1000 UNIT/ML IJ SOLN
INTRAMUSCULAR | Status: AC
  Administered 2019-08-13: 1000 [IU] via INTRAVENOUS_CENTRAL
  Filled 2019-08-13: qty 4

## 2019-08-13 NOTE — Progress Notes (Signed)
Progress Note  [name] Date of Encounter: 08/13/2019  Primary Cardiologist: Evalina Field, MD   Subjective  No overnight events  Inpatient Medications    Scheduled Meds:  acetaminophen  500 mg Oral BID   bisacodyl  10 mg Rectal Daily   Chlorhexidine Gluconate Cloth  6 each Topical Q0600   collagenase   Topical BID   [START ON 08/19/2019] darbepoetin (ARANESP) injection - NON-DIALYSIS  100 mcg Subcutaneous Q Sat-1800   diclofenac Sodium  2 g Topical BID   famotidine  10 mg Oral Daily   feeding supplement (ENSURE ENLIVE)  237 mL Oral BID BM   feeding supplement (PRO-STAT SUGAR FREE 64)  30 mL Per Tube Daily   gabapentin  100 mg Per Tube Q12H   Gerhardt's butt cream   Topical TID   heparin  5,000 Units Subcutaneous Q8H   loratadine  10 mg Oral Daily   mouth rinse  15 mL Mouth Rinse BID   methylPREDNISolone (SOLU-MEDROL) injection  100 mg Intravenous Q8H   metoprolol tartrate  25 mg Oral BID   neomycin-bacitracin-polymyxin   Topical BID   nystatin  5 mL Mouth/Throat QID   pantoprazole (PROTONIX) IV  40 mg Intravenous Q12H   sertraline  25 mg Oral BID   sodium chloride flush  3 mL Intravenous Q12H   sodium chloride irrigation  15 mL Irrigation QID   thiamine  100 mg Oral Daily   Continuous Infusions:  sodium chloride     sodium chloride     sodium chloride     feeding supplement (NEPRO CARB STEADY) 45 mL/hr at 08/13/19 0800   ondansetron (ZOFRAN) IV     PRN Meds: sodium chloride, sodium chloride, sodium chloride, acetaminophen (TYLENOL) oral liquid 160 mg/5 mL, ALPRAZolam, alteplase, alum & mag hydroxide-simeth, antiseptic oral rinse, bisacodyl, bisacodyl, camphor-menthol, fentaNYL (SUBLIMAZE) injection, heparin, hydrOXYzine, ondansetron (ZOFRAN) IV, oxyCODONE, polyethylene glycol, polyvinyl alcohol, senna-docusate, sodium chloride flush, witch hazel-glycerin   Vital Signs    Vitals:   08/12/19 2114 08/12/19 2333 08/13/19  0344 08/13/19 0807  BP: 133/89 122/89 (!) 126/91 115/74  Pulse: 88 79 84 79  Resp:  (!) 24 (!) 23 13  Temp:  97.6 F (36.4 C) (!) 97.4 F (36.3 C) (!) 97.4 F (36.3 C)  TempSrc:  Oral Oral Axillary  SpO2:  100% 100% 100%  Weight:   40.8 kg   Height:        Intake/Output Summary (Last 24 hours) at 08/13/2019 1118 Last data filed at 08/13/2019 0939 Gross per 24 hour  Intake 1409 ml  Output --  Net 1409 ml   Last 3 Weights 08/13/2019 08/12/2019 08/11/2019  Weight (lbs) 90 lb 93 lb 90 lb 6.2 oz  Weight (kg) 40.824 kg 42.185 kg 41 kg      Telemetry   Sinus rhythm with brief SVT and 4 beat NSVT- Personally Reviewed  ECG    1/20, NSR with TWI in lateral leads - Personally Reviewed  Physical Exam  Constitutional: frail and cachectic Cardiovascular: regular rhythm, normal rate, no murmurs. S1 and S2 normal. Radial pulses normal bilaterally. No jugular venous distention.  Respiratory: clear to auscultation bilaterally GI : normal bowel sounds, soft and nontender. No distention.   MSK: thin with no edema NEURO: grossly nonfocal exam, moves all extremities. PSYCH: alert and oriented x 3, normal mood and affect.   Labs    High Sensitivity Troponin:  No results for input(s): TROPONINIHS in the last 720 hours.  Chemistry Recent Labs  Lab 08/11/19 0234 08/12/19 0441 08/13/19 0734  NA 131* 133* 133*  K 4.8 4.5 5.6*  CL 92* 96* 92*  CO2 21* 20* 21*  GLUCOSE 140* 148* 130*  BUN 108* 79* 153*  CREATININE 3.64* 2.71* 3.78*  CALCIUM 9.3 9.3 9.7  ALBUMIN 3.0* 3.1* 3.2*  GFRNONAA 12* 17* 11*  GFRAA 14* 20* 13*  ANIONGAP 18* 17* 20*     Hematology Recent Labs  Lab 08/10/19 0626 08/10/19 0630 08/11/19 0234 08/12/19 0441  WBC 1.5*  --  1.6* 4.2  RBC 4.32 4.27 4.11 4.52  HGB 12.1  --  11.5* 12.4  HCT 41.7  --  39.8 42.9  MCV 96.5  --  96.8 94.9  MCH 28.0  --  28.0 27.4  MCHC 29.0*  --  28.9* 28.9*  RDW 20.8*  --  20.1* 20.8*  PLT 251  --  234 252    BNPNo results  for input(s): BNP, PROBNP in the last 168 hours.   DDimer No results for input(s): DDIMER in the last 168 hours.   Radiology    No results found.  Cardiac Studies   Echo 07/09/2019 IMPRESSIONS    1. Left ventricular ejection fraction, by visual estimation, is 45 to 50%. The left ventricle has moderately decreased function. There is no left ventricular hypertrophy.  2. The left ventricle demonstrates global hypokinesis.  3. Global right ventricle was not assessed.The right ventricular size is not well visualized. Right vetricular wall thickness was not assessed.  4. Left atrial size was not assessed.  5. Moderate pericardial effusion.  6. The pericardial effusion is circumferential.  7. The mitral valve is grossly normal. Mild mitral valve regurgitation.  8. The tricuspid valve is grossly normal. Tricuspid valve regurgitation moderate-severe.  9. The tricuspid valve was normal in structure. Tricuspid valve regurgitation moderate-severe. 10. Aortic valve regurgitation is moderate to severe. 11. The aortic valve is tricuspid. Aortic valve regurgitation is moderate to severe. 12. Aortic root could not be assessed. 13. Mildly elevated pulmonary artery systolic pressure. 14. The inferior vena cava is normal in size with <50% respiratory variability, suggesting right atrial pressure of 8 mmHg. 15. Limited echo. Effusion appears similar to just slightly enlarged from prior. Aortic regurgitation has been previously evaluated by TEE; incomplete evaluation on this study but cannot exclude severe AR based on images. 16. The interatrial septum was not assessed.   Patient Profile     69 y.o. female with PMH of HTN, OA, pericardial effusion, anasarca 2/2 liver disease, and moderate AI who was sent to the hospital by Dr. Audie Box on 07/03/2019 for worsening dyspnea and atrial tachycardia with HR 130s. CTA negative PE, but showed moderate pericardial effusion and airspace disease concerning for  infection with superimposed edema. Bedside echo demonstrated large pericardial effusion. She underwent IV diuresis, renal function subsequently worsened. PYP scan negative for amyloid. Right heart cath performed on 12/17 showed mild PAH, CO 5.0, CI 3.2. 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 Echo 12/20 showed EF 45-50%, no temponade, moderate to severe AI, enlarging pericardial effusion. She underwent pericardial window by Dr. Wyvonnia Lora 07/12/2019. Repeat echo 12/28 EF 50-55%, mid MR, moderate AI. Worsening renal function (Cr 0.67 on arrival, 5.1 by 12/29) resulted in initiation of dialysis on 12/29. Tunneled HD catheter placed 12/29. G tube placed. Thoracentesis performed on 1/12 due to worsening dyspnea, 1100 ml light amber pleural fluid removed. Started on steroid by palliative care  to stimulate appetite and reduce inflammatory pain and possibly undiagnosed rheumatological disorder. She had an episode of unresponsiveness on 1/14 after dialysis and second dose of ativan, this quickly resolved and felt to be overmedication.  Assessment & Plan    1. PAF with RVR  - likely occurred in the setting of multiple stress test. Given the need for HD, only option for anticoagulation would be coumadin. She would not be a good candidate for coumadin given recent subxiphoid window and multiple other issues.   - telemetry indicate patient is actively converting in and out of afib/flutter. Will focus more on rate control strategy, if BP unable to tolerate, even consider rhythm control (given her liver disease may not be a good candidate for amiodarone). -  on metoprolol 25 mg BID and holding amlodipine. BP stable, rates on tele stable in sinus.  2. Anasarca/pericardial effusion   - negative PYP scan for amyloid. Liver biopsy at [facility] showed mild hepatic steatosis. Negative EGD biopsy for amyloidosis at [facility].  - Right heart cath performed on 12/17 showed  mild PAH, CO 5.0, CI 3.2. 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   - Echo 12/20 showed EF 45-50%, no temponade, moderate to severe AI, enlarging pericardial effusion.  - s/p pericardial window by Dr. Kipp Brood 07/12/2019  - anasarca related to combination of liver disease and severe hypoalbuminemia. 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 Dr. Hilma Favors discussed with Rheumatology at [facility], who recommended high dose steroid. Initially planned to transfer to a tertiary center where she can receive acute inpatient rheumatology service, however no bed available at [facility] and [facility]. Dr. Jana Hakim did not recommend further search for malignancy given extensive imaging during this admission. Additional screening as outpatient.  4. AKI  - Worsening renal function (Cr 0.67 on arrival, 5.1 by 12/29) resulted in initiation of dialysis on 12/29. Tunneled HD catheter placed 12/29.  4. Anemia: stable.   5. Malnutrition: severe malnourishment. G tube feeding. Weight 40.8 kg, BMI 14.5  6. Moderate AI: seen on last echo     For questions or updates, please contact [place] Please consult [email or web address] for contact info under      Signed, Elouise Munroe, MD  08/13/2019, 11:18 AM

## 2019-08-13 NOTE — Progress Notes (Signed)
Patient told that she couldn't sleep much last night and she was sleeping in the morning. She was arouse with one eye open and said that " I'm tired." Then she was lethargy whole day even after HD. V/S was stable. Now, Her both eye wide often by voice then she told that she was really tired and acknowledged her husband left to home. Continue to monitor pt's condition. HS [employer]

## 2019-08-13 NOTE — Plan of Care (Signed)
  Problem: Education: Goal: Knowledge of General Education information will improve Description: Including pain rating scale, medication(s)/side effects and non-pharmacologic comfort measures Outcome: Progressing   Problem: Health Behavior/Discharge Planning: Goal: Ability to manage health-related needs will improve Outcome: Progressing   Problem: Activity: Goal: Risk for activity intolerance will decrease Outcome: Progressing   Problem: Coping: Goal: Level of anxiety will decrease Outcome: Progressing   Problem: Elimination: Goal: Will not experience complications related to bowel motility Outcome: Progressing Goal: Will not experience complications related to urinary retention Outcome: Progressing   Problem: Pain Managment: Goal: General experience of comfort will improve Outcome: Progressing   Problem: Safety: Goal: Ability to remain free from injury will improve Outcome: Progressing   Problem: Skin Integrity: Goal: Risk for impaired skin integrity will decrease Outcome: Progressing

## 2019-08-13 NOTE — Plan of Care (Signed)
  Problem: Clinical Measurements: Goal: Ability to maintain clinical measurements within normal limits will improve Outcome: Progressing Goal: Will remain free from infection Outcome: Progressing Goal: Respiratory complications will improve Outcome: Progressing Goal: Cardiovascular complication will be avoided Outcome: Progressing   Problem: Coping: Goal: Level of anxiety will decrease Outcome: Progressing   Problem: Pain Managment: Goal: General experience of comfort will improve Outcome: Progressing

## 2019-08-13 NOTE — Progress Notes (Signed)
[facility] Progress Note    Assessment/ Plan:   Pt is a 69 y.o. yo female  with history of HTN,  hepatic steatosis with low albumin, chronic pericardial effusion, moderate AR admitted on 12/17 for progressive dyspnea, anasarca , seen as a consultationfor AKIand fluid overload.  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  Autoimmune work-up was unremarkable initially but now some consideration of scleroderma, need rheum eval formally.   Started HD 07/18/19 -  [facility].  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  Starting CLIP process.  Long way to go for outpt HD.  Tentatively has a spot in [place].  She has pronounced azotemia and tendency towards hyperkalemia on a 3 times weekly HD schedule, I think she will need treatment 4 times weekly moving forward at least until off of steroids.  Given significant azotemia and hyperkalemia, will provide 3-hour treatment today.   #Hyperkalemia:  Has ben havinge intermittent hyperK.  Try to avoid lokelma as it irritated throat.  As above  #Anasarca presumably due to liver disease which was diagnosed at [facility]. She has a history of liver steatosis.No evidence of GN/nephrotic syndrome. 3rd spacing also. volume status much improved but is still an issue- UF as able   #Moderate to large pericardial effusion:  s/p pericardial window on 12/23- TCTS following, stable  #Hypertension: Stable, blood pressures normal of late cont UF as able . #Anemia: s/p transfusion on 12/22.  s/p feraheme and on ESA weekly.  ESA this week was held as hemoglobin greater than 12  #Hyperphosphatemia:  Normalized, no binder  **Malnutrition:  S/p G tube 1/8 -  Albumin  low  Dispo:    Palliative following,    Subjective:    Last HD on 1/22, BUN 153 and K5.6 today No UOP   Objective:   BP 115/74 (BP Location: Left Arm)   Pulse 79   Temp (!) 97.4 F (36.3 C) (Axillary)   Resp 13   Ht 5' 6"  (1.676 m)   Wt 40.8 kg   SpO2 100%   BMI 14.53 kg/m   Intake/Output Summary (Last 24 hours) at 08/13/2019 1106 Last data filed at 08/13/2019 9735 Gross per 24 hour  Intake 1409 ml  Output --  Net 1409 ml   Weight change: -0.176 kg  Physical Exam: Gen: cachectic, -  NAD CVS: RRR, well-healing scars Resp: normal WOB Abd: thin, mildly painful to palpation Ext: + flank edema ACCESS: L TDC c/d/i  Imaging: No results found.  Labs: BMET Recent Labs  Lab 08/07/19 0854 08/07/19 1417 08/08/19 0327 08/08/19 1842 08/09/19 0104 08/10/19 0626 08/11/19 0234 08/12/19 0441 08/13/19 0734  NA 129*  --  133* 132* 129* 133* 131* 133* 133*  K 6.1*  --  5.3* 5.0 5.4* 4.0 4.8 4.5 5.6*  CL 88*  --  96* 95* 92* 95* 92* 96* 92*  CO2 21*  --  20* 21* 19* 21* 21* 20* 21*  GLUCOSE 135*   < > 114* 95 121* 118* 140* 148* 130*  BUN 165*  --  99* 123* 132* 70* 108* 79* 153*  CREATININE 5.05*  --  3.40* 3.92* 4.12* 2.75* 3.64* 2.71* 3.78*  CALCIUM 9.6  --  9.1 9.3 9.0 9.2  9.3 9.3 9.7  PHOS 6.3*  --  4.6  --  5.2* 4.5 5.8* 4.6 5.7*   < > = values in this interval not displayed.   CBC Recent Labs  Lab 08/08/19 1052 08/08/19 1052 08/09/19 0722 08/10/19 0626 08/11/19 0234 08/12/19 0441  WBC 1.4*   < > 1.0* 1.5* 1.6* 4.2  NEUTROABS 0.6*  --   --  0.6*  --   --   HGB 10.9*   < > 10.5* 12.1 11.5* 12.4  HCT 36.8   < > 34.8* 41.7 39.8 42.9  MCV 96.1   < > 96.1 96.5 96.8 94.9  PLT 291   < > 301 251 234 252   < > = values in this interval not displayed.    Medications:    . acetaminophen  500 mg Oral BID  . bisacodyl  10 mg Rectal Daily  . Chlorhexidine Gluconate Cloth  6 each Topical Q0600  . collagenase   Topical BID  . [START ON 08/19/2019] darbepoetin  (ARANESP) injection - NON-DIALYSIS  100 mcg Subcutaneous Q Sat-1800  . diclofenac Sodium  2 g Topical BID  . famotidine  10 mg Oral Daily  . feeding supplement (ENSURE ENLIVE)  237 mL Oral BID BM  . feeding supplement (PRO-STAT SUGAR FREE 64)  30 mL Per Tube Daily  . gabapentin  100 mg Per Tube Q12H  . Gerhardt's butt cream   Topical TID  . heparin  5,000 Units Subcutaneous Q8H  . loratadine  10 mg Oral Daily  . mouth rinse  15 mL Mouth Rinse BID  . methylPREDNISolone (SOLU-MEDROL) injection  100 mg Intravenous Q8H  . metoprolol tartrate  25 mg Oral BID  . neomycin-bacitracin-polymyxin   Topical BID  . nystatin  5 mL Mouth/Throat QID  . pantoprazole (PROTONIX) IV  40 mg Intravenous Q12H  . sertraline  25 mg Oral BID  . sodium chloride flush  3 mL Intravenous Q12H  . sodium chloride irrigation  15 mL Irrigation QID  . thiamine  100 mg Oral Daily      [facility]  08/13/2019, 11:06 AM

## 2019-08-13 NOTE — Progress Notes (Addendum)
[facility]  PROGRESS NOTE  Joanna Ali WER:[id number] DOB: 1950-10-03 DOA: 07/03/2019 PCP: Renaldo Reel, DO   Brief HPI:   69 year old female with a history of hypertension, chronic pericardial effusion, moderate aortic valve regurgitation, former smoker, alcohol abuse initially admitted on 07/06/2019 for progressive shortness of breath with anasarca and fatigue.  She was previously worked up by [facility] rheumatology with negative work-up including ESR, lupus anticoagulant, rheumatoid factor, TSH, ANCA, ANA, light free chain studies.  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  Since that time patient had stopped drinking alcohol as advised.  She has been on diuretics and spironolactone without any improvement in symptoms.  CT of the chest on 07/04/2019 noted moderate pericardial effusion and airspace disease.  She underwent thoracentesis by PCCM which removed 700 mL of the fluid.  Patient underwent right heart cath on 07/06/2019 and pericardial window on 07/12/2019.  Attempts were made to diurese with IV diuretics but patient with worsening kidney function with creatinine up to 5.  Nephrology was formally consulted patient was started on dialysis.  She has been having poor p.o. intake so code tract was placed.  She was then later placed on PEG tube for persistent poor p.o. intake, also patient developed oral pain after NG tube placement.  She is on hemodialysis for AKI with rapid progression to end-stage renal disease, severe anasarca, pericardial effusion, status post pericardial window.  She was recently started on empiric steroids which will be weaned off.  Her labs showed RF positive at 71, INT RNA polymerase antibody +146 concern for scleroderma also she had some thickening of the skin with renal failure and pericardial involvement.  Pulmonary  was consulted to evaluate for ILD.  Dr. Regular diet discussed with rheumatology at [facility] who recommended supportive care, acid reflux treated with PPI, joint pain Voltaren gel and Tylenol.  Prednisone taper.  For Raynaud's phenomenon calcium channel blocker.  If patient has interstitial lung disease then she should be evaluated for immunotherapy can be as outpatient when stronger.  Rheumatology did not recommend immunotherapy for just joint pain and skin manifestation.    Subjective   Patient seen and examined, appears more alert, communicative.  Seems to be doing well with high-dose steroids.  Denies chest pain or shortness of breath.   Assessment/Plan:     1. ? Scleroderma /anasarca/joint pain/pericardial effusion-patient presented with anasarca, failure to thrive, joint pains, pericardial effusion, Raynaud's phenomenon.  She had positive anti-RNA polymerase 3 antibody.  She was started on IV steroids to treat empirically for rheumatologic etiology.  Rheumatology evaluation in September 2020 showed CRP 13, ESR 25, ANA negative, c-ANCA/p-ANCA negative, scleroderma SCL 70 -, SSS (ro) negative, M spike negative electrophoresis, HIV negative.  Double-stranded DNA negative, ANA negative, RF 71.  Patient received IV Solu-Medrol 125 mg for 3 days then changed to 50 mg p.o. daily.  Rheumatology at [facility] recommended quick taper as we were considering scleroderma.  [facility] do not have bed availability at this time.  Dr. Hilma Favors discussed with rheumatologist Dr. Kathlene November, who recommended high-dose steroids, patient started on Solu-Medrol 125 mg IV for 3 days then switch to  prednisone 60 mg daily for 2 weeks and then slow taper.  2. Atrial fibrillation RVR-patient was on Coreg. Patient is not a good candidate for anticoagulation considering multiple comorbid conditions, pericardial window.  Cardiology has been  following patient since her hospitalization.  Appreciate cardiology  recommendations.  Coreg has been changed to metoprolol.  No anticoagulation at this time.  Heart rate is controlled on metoprolol 25 mg p.o. twice daily  3. ?  Malignancy - Patient developed leukopenia, oncology was consulted.  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 Jana Hakim saw the patient and does not recommend further search for malignancy.  As patient had extensive imaging this admission with no malignancy identified, CT chest noncontrast yesterday was negative, CT abdomen/pelvis on 1/16 was unremarkable and repeat SPEP showed no monoclonal protein with normal kappa lambda ratio.  She also had negative pleural fluid cytology x4.  Screening malignancy can be done as  Outpatient.  4. ESRD/intermittent hyperkalemia- new on HD-patient has progressively AKI found to be exacerbated by contrast-induced nephropathy.  She was started on hemodialysis with improvement of anasarca.  Outpatient dialysis has been set up.  Potassium is 5.6, nephrology recommends not treating with [facility] as it irritated her throat.  Follow potassium level in a.m.  Dialysis scheduled for tomorrow.  5. Lethargy/encephalopathy-resolved, patient became lethargic and hemodialysis on 08/02/2019 she was receiving Ativan at that time.  Fentanyl was stopped, opiates were decreased.  Ammonia level 29, she received IV thiamine 250 mg 3 times daily for 3 days.  Thiamine level is 192.5.  6. Acute hypoxic respiratory failure/pulmonary edema/pleural effusion-stable, she has been weaned off oxygen.  Currently 100% on room air.  She was started on IV Solu-Medrol by palliative care for presumed autoimmune process.  She had worsening dyspnea with pleural effusion, she underwent repeat thoracentesis on 08/01/2019.  Removed 1.1 L from left and 500 mL from right.  Pleural cytology showed reactive mesothelial cells.  Pleural fluid culture showed no growth.  PCCM was consulted for possible ILD.  7. Pericardial effusion with elevated  troponin-patient is status post pericardial window on 07/12/2019.  Pathology showed no malignancy, chronic inflammation, AFB negative.  Fungal culture came back negative.  8. Atypical pneumonia with hypoxia-completed treatment.  9. Severe malnutrition/failure to thrive-continue with tube feeding.  Speech therapy consulted, and patient started on regular diet.  10. Dysphagia/thrush-group A strep was negative on 07/28/2019, patient currently on oral nystatin, oral Diflucan.  11. Anxiety-continue Zoloft, started on Xanax 0.25 mg 3 times daily as needed for panic attack.    12. Back pain-continue with oxycodone as needed, Voltaren gel twice a day.  13. Hypertension-continue Coreg, Norvasc.    SpO2: 100 % O2 Flow Rate (L/min): 2 L/min     Lab Results  Component Value Date   SARSCOV2NAA NEGATIVE 07/04/2019   [place] NEGATIVE 07/03/2019   [place] NEGATIVE 04/04/2019   SARSCOV2NAA NOT DETECTED 03/25/2019     CBG: Recent Labs  Lab 08/12/19 1609 08/12/19 2012 08/13/19 0018 08/13/19 0535 08/13/19 0809  GLUCAP 144* 133* 124* 111* 101*    CBC: Recent Labs  Lab 08/08/19 1052 08/09/19 0722 08/10/19 0626 08/11/19 0234 08/12/19 0441  WBC 1.4* 1.0* 1.5* 1.6* 4.2  NEUTROABS 0.6*  --  0.6*  --   --   HGB 10.9* 10.5* 12.1 11.5* 12.4  HCT 36.8 34.8* 41.7 39.8 42.9  MCV 96.1 96.1 96.5 96.8 94.9  PLT 291 301 251 234 937    Basic Metabolic Panel: Recent Labs  Lab 08/09/19 0104 08/10/19 0626 08/11/19 0234 08/12/19 0441 08/13/19 0734  NA 129* 133* 131* 133* 133*  K 5.4* 4.0 4.8 4.5 5.6*  CL 92* 95* 92* 96* 92*  CO2 19* 21* 21* 20* 21*  GLUCOSE 121* 118* 140* 148* 130*  BUN 132* 70* 108* 79* 153*  CREATININE 4.12* 2.75* 3.64* 2.71* 3.78*  CALCIUM 9.0 9.2 9.3 9.3 9.7  PHOS 5.2* 4.5 5.8* 4.6 5.7*     Liver Function Tests: Recent Labs  Lab 08/09/19 0104 08/10/19 0626 08/11/19 0234 08/12/19 0441 08/13/19 0734  ALBUMIN 3.0* 3.1* 3.0* 3.1* 3.2*        DVT  prophylaxis: Heparin  Code Status: Full code  Family Communication: No family at bedside  Disposition Plan: likely skilled nursing facility versus CIR  Pressure Injury 07/19/19 Sacrum Posterior;Mid Stage 2 -  Partial thickness loss of dermis presenting as a shallow open injury with a red, pink wound bed without slough. (Active)  07/19/19 0600  Location: Sacrum  Location Orientation: Posterior;Mid  Staging: Stage 2 -  Partial thickness loss of dermis presenting as a shallow open injury with a red, pink wound bed without slough.  Wound Description (Comments):   Present on Admission: No     Pressure Injury 07/22/19 Nare Right;Left Stage 2 -  Partial thickness loss of dermis presenting as a shallow open injury with a red, pink wound bed without slough. Injury r/t cortrak (Active)  07/22/19 1000  Location: Nare  Location Orientation: Right;Left  Staging: Stage 2 -  Partial thickness loss of dermis presenting as a shallow open injury with a red, pink wound bed without slough.  Wound Description (Comments): Injury r/t cortrak  Present on Admission: No        Scheduled medications:  . acetaminophen  500 mg Oral BID  . bisacodyl  10 mg Rectal Daily  . Chlorhexidine Gluconate Cloth  6 each Topical Q0600  . collagenase   Topical BID  . [START ON 08/19/2019] darbepoetin (ARANESP) injection - NON-DIALYSIS  100 mcg Subcutaneous Q Sat-1800  . diclofenac Sodium  2 g Topical BID  . famotidine  10 mg Oral Daily  . feeding supplement (ENSURE ENLIVE)  237 mL Oral BID BM  . feeding supplement (PRO-STAT SUGAR FREE 64)  30 mL Per Tube Daily  . gabapentin  100 mg Per Tube Q12H  . Gerhardt's butt cream   Topical TID  . heparin  5,000 Units Subcutaneous Q8H  . loratadine  10 mg Oral Daily  . mouth rinse  15 mL Mouth Rinse BID  . methylPREDNISolone (SOLU-MEDROL) injection  100 mg Intravenous Q8H  . metoprolol tartrate  25 mg Oral BID  . neomycin-bacitracin-polymyxin   Topical BID  . nystatin  5  mL Mouth/Throat QID  . pantoprazole (PROTONIX) IV  40 mg Intravenous Q12H  . sertraline  25 mg Oral BID  . sodium chloride flush  3 mL Intravenous Q12H  . sodium chloride irrigation  15 mL Irrigation QID  . thiamine  100 mg Oral Daily    Consultants:  Oncology  Nephrology  Palliative care  Procedures:    Antibiotics:   Anti-infectives (From admission, onward)   Start     Dose/Rate Route Frequency Ordered Stop   07/29/19 1500  fluconazole (DIFLUCAN) tablet 200 mg     200 mg Oral Daily 07/29/19 1458 08/11/19 2116   07/28/19 1300  ceFAZolin (ANCEF) 2-4 GM/100ML-% IVPB    Note to Pharmacy: Kandy Garrison   : cabinet override      07/28/19 1300 07/28/19 1806   07/28/19 0700  ceFAZolin (ANCEF) IVPB 2g/100 mL premix     2 g 200 mL/hr over 30 Minutes Intravenous On call 07/27/19 1502 07/28/19 1340   07/18/19 1316  ceFAZolin (ANCEF) 2-4 GM/100ML-% IVPB    Note to Pharmacy: Manuela Neptune   : cabinet override      07/18/19 1316 07/18/19 1318   07/12/19 2115  ceFAZolin (ANCEF) IVPB 1 g/50 mL premix     1 g 100 mL/hr over 30 Minutes Intravenous Every 8 hours 07/12/19 2102 07/13/19 0516   07/03/19 2245  cefTRIAXone (ROCEPHIN) 2 g in sodium chloride 0.9 % 100 mL IVPB  Status:  Discontinued     2 g 200 mL/hr over 30 Minutes Intravenous Every 24 hours 07/03/19 2230 07/04/19 1336   07/03/19 2245  azithromycin (ZITHROMAX) 500 mg in sodium chloride 0.9 % 250 mL IVPB  Status:  Discontinued     500 mg 250 mL/hr over 60 Minutes Intravenous Every 24 hours 07/03/19 2230 07/05/19 1037       Objective   Vitals:   08/12/19 2114 08/12/19 2333 08/13/19 0344 08/13/19 0807  BP: 133/89 122/89 (!) 126/91 115/74  Pulse: 88 79 84 79  Resp:  (!) 24 (!) 23 13  Temp:  97.6 F (36.4 C) (!) 97.4 F (36.3 C) (!) 97.4 F (36.3 C)  TempSrc:  Oral Oral Axillary  SpO2:  100% 100% 100%  Weight:   40.8 kg   Height:        Intake/Output Summary (Last 24 hours) at 08/13/2019 1046 Last data filed  at 08/13/2019 3335 Gross per 24 hour  Intake 689 ml  Output --  Net 689 ml    01/22 1901 - 01/24 0700 In: 2627 [P.O.:455; I.V.:12] Out: -   Filed Weights   08/11/19 0738 08/12/19 0353 08/13/19 0344  Weight: 41 kg 42.2 kg 40.8 kg    Physical Examination:   General-appears in no acute distress Heart-S1-S2, regular, no murmur auscultated Lungs-clear to auscultation bilaterally, no wheezing or crackles auscultated Abdomen-soft, nontender, no organomegaly Extremities-no edema in the lower extremities Neuro-alert, oriented x3, no focal deficit noted  Data Reviewed:     Cardiac Enzymes: No results for input(s): CKTOTAL, CKMB, CKMBINDEX, TROPONINI in the last 168 hours. BNP (last 3 results) Recent Labs    04/04/19 2000 07/03/19 1930 07/09/19 0347  BNP 76.7 1,124.1* 4,004.9*     Studies:  No results found.   Admission status: Inpatient: Based on patients clinical presentation and evaluation of above clinical data, I have made determination that patient meets Inpatient criteria at this time.   Oswald Hillock   [facility] If 7PM-7AM, please contact night-coverage at [email or web address], Office  [phone]  password [email or web address]  08/13/2019, 10:46 AM  LOS: 40 days

## 2019-08-14 ENCOUNTER — Other Ambulatory Visit: Payer: Self-pay | Admitting: Oncology

## 2019-08-14 LAB — RENAL FUNCTION PANEL
Albumin: 3.1 g/dL — ABNORMAL LOW (ref 3.5–5.0)
Anion gap: 20 — ABNORMAL HIGH (ref 5–15)
BUN: 82 mg/dL — ABNORMAL HIGH (ref 8–23)
CO2: 22 mmol/L (ref 22–32)
Calcium: 9.2 mg/dL (ref 8.9–10.3)
Chloride: 95 mmol/L — ABNORMAL LOW (ref 98–111)
Creatinine, Ser: 2.49 mg/dL — ABNORMAL HIGH (ref 0.44–1.00)
GFR calc Af Amer: 22 mL/min — ABNORMAL LOW (ref >60)
GFR calc non Af Amer: 19 mL/min — ABNORMAL LOW (ref >60)
Glucose, Bld: 45 mg/dL — ABNORMAL LOW (ref 70–99)
Phosphorus: 4.1 mg/dL (ref 2.5–4.6)
Potassium: 4.4 mmol/L (ref 3.5–5.1)
Sodium: 137 mmol/L (ref 135–145)

## 2019-08-14 LAB — GLUCOSE, CAPILLARY
Glucose-Capillary: 85 mg/dL (ref 70–99)
Glucose-Capillary: 105 mg/dL — ABNORMAL HIGH (ref 70–99)
Glucose-Capillary: 108 mg/dL — ABNORMAL HIGH (ref 70–99)
Glucose-Capillary: 123 mg/dL — ABNORMAL HIGH (ref 70–99)
Glucose-Capillary: 114 mg/dL — ABNORMAL HIGH (ref 70–99)

## 2019-08-14 MED ORDER — CHLORHEXIDINE GLUCONATE CLOTH 2 % EX PADS
6.0000 | MEDICATED_PAD | Freq: Every day | CUTANEOUS | Status: DC
  Administered 2019-08-14 – 2019-08-16 (×2): 6 via TOPICAL

## 2019-08-14 MED ORDER — PREDNISONE 50 MG PO TABS
60.0000 mg | ORAL_TABLET | Freq: Every day | ORAL | Status: DC
  Administered 2019-08-14 – 2019-08-15 (×2): 60 mg via ORAL
  Filled 2019-08-14 (×2): qty 1

## 2019-08-14 MED ORDER — PANTOPRAZOLE SODIUM 40 MG PO PACK
40.0000 mg | PACK | Freq: Two times a day (BID) | ORAL | Status: DC
  Administered 2019-08-14 – 2019-08-22 (×18): 40 mg
  Filled 2019-08-14 (×21): qty 20

## 2019-08-14 NOTE — Progress Notes (Signed)
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 Kipp Brood 07/12/2019.  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  Hematology raised the concern of a malignancy or scleroderma although an extensive search for malignancy was not recommended by Dr. Jana Hakim.  Initial plans were for transfer to tertiary center however this did not occur due to lack of bed availability.  And her anasarca was thought to be secondary to liver disease and severe hypoalbuminemia.  Her last dialysis 08/12/2018 with 1.2 L removal she continues on a Tuesday Thursday Saturday dialysis schedule she is anuric  Her worsening renal function led to initiation of dialysis on 07/18/2019 with placement of a tunneled hemodialysis catheter.  Blood pressure 140/85 pulse 93 temperature 97.6 O2 sats 1% room air  Sodium 137 potassium 4.4 chloride 95 CO2 22 BUN 82 creatinine four 2.49 glucose 45 calcium 9.2 phosphorus 4.1 albumin 3.1.  WBC 4.2 hemoglobin 12.4 platelets  252.  Neurontin 100 mg twice daily Claritin 10 mg daily, Lopressor 25 mg twice daily, Protonix 40 mg twice daily, Zoloft 25 mg daily  Objective:  Vital signs in last 24 hours:  Temp:  [97.6 F (36.4 C)-98 F (36.7 C)] 97.6 F (36.4 C) (01/25 0737) Pulse Rate:  [80-110] 93 (01/25 0737) Resp:  [12-27] 18 (01/25 0737) BP: (82-149)/(56-93) 140/85 (01/25 0737) SpO2:  [100 %] 100 % (01/25 0737) Weight:  [39.5 kg-41.3 kg] 39.5 kg (01/25 0424)  Weight change: 0.454 kg Filed Weights   08/13/19 1315 08/13/19 1630 08/14/19 0424   Weight: 41.3 kg 40.3 kg 39.5 kg    Intake/Output: I/O last 3 completed shifts: In: 426 [I.V.:12; NG/GT:900] Out: 1254 [Other:1254]   Intake/Output this shift:  No intake/output data recorded. Cachectic and frail CVS- RRR JVP not elevated RS- CTA no wheezes or rales ABD- BS present soft non-distended EXT- no edema   Basic Metabolic Panel: Recent Labs  Lab 08/10/19 0626 08/10/19 0626 08/11/19 0234 08/11/19 0234 08/12/19 0441 08/13/19 0734 08/14/19 0603  NA 133*  --  131*  --  133* 133* 137  K 4.0  --  4.8  --  4.5 5.6* 4.4  CL 95*  --  92*  --  96* 92* 95*  CO2 21*  --  21*  --  20* 21* 22  GLUCOSE 118*  --  140*  --  148* 130* 45*  BUN 70*  --  108*  --  79* 153* 82*  CREATININE 2.75*  --  3.64*  --  2.71* 3.78* 2.49*  CALCIUM 9.2   < > 9.3   < > 9.3 9.7 9.2  PHOS 4.5  --  5.8*  --  4.6 5.7* 4.1   < > = values in this interval not displayed.    Liver Function Tests: Recent Labs  Lab 08/10/19 0626 08/11/19 0234 08/12/19 0441 08/13/19 0734 08/14/19 0603  ALBUMIN 3.1* 3.0* 3.1* 3.2* 3.1*   No results for input(s): LIPASE, AMYLASE in the last 168 hours. No results for input(s): AMMONIA in the last 168 hours.  CBC: Recent Labs  Lab 08/08/19 1052 08/09/19 8341 08/10/19 9622 08/11/19 0234 08/12/19 0441  WBC 1.4* 1.0* 1.5* 1.6* 4.2  NEUTROABS 0.6*  --  0.6*  --   --   HGB 10.9* 10.5* 12.1 11.5* 12.4  HCT 36.8 34.8* 41.7 39.8 42.9  MCV 96.1 96.1 96.5 96.8 94.9  PLT 291 301 251 234 252    Cardiac Enzymes: No results for input(s): CKTOTAL, CKMB, CKMBINDEX, TROPONINI in the last 168 hours.  BNP: Invalid input(s): POCBNP  CBG: Recent Labs  Lab 08/13/19 1119 08/13/19 1536 08/13/19 2035 08/13/19 2337 08/14/19 0503  GLUCAP 104* 105* 84 92 85    Microbiology: Results for orders placed or performed during the hospital encounter of 07/03/19  Culture, blood (routine x 2)     Status: None   Collection Time: 07/03/19  7:28 PM   Specimen: BLOOD RIGHT  FOREARM  Result Value Ref Range Status   Specimen Description BLOOD RIGHT FOREARM  Final   Special Requests   Final    BOTTLES DRAWN AEROBIC AND ANAEROBIC Blood Culture adequate volume   Culture   Final    NO GROWTH 5 DAYS Performed at [facility], [place] [address], [place], [place] [address]    Report Status 07/08/2019 FINAL  Final  Culture, blood (routine x 2)     Status: None   Collection Time: 07/03/19  7:34 PM   Specimen: BLOOD LEFT HAND  Result Value Ref Range Status   Specimen Description BLOOD LEFT HAND  Final   Special Requests AEROBIC BOTTLE ONLY Blood Culture adequate volume  Final   Culture   Final    NO GROWTH 5 DAYS Performed at [facility], [place] [address], [place], [place] [address]    Report Status 07/08/2019 FINAL  Final  SARS CORONAVIRUS 2 (TAT 6-24 HRS) Nasopharyngeal Nasopharyngeal Swab     Status: None   Collection Time: 07/03/19 10:11 PM   Specimen: Nasopharyngeal Swab  Result Value Ref Range Status   SARS Coronavirus 2 NEGATIVE NEGATIVE Final    Comment: (NOTE) SARS-CoV-2 target nucleic acids are NOT DETECTED. The SARS-CoV-2 RNA is generally detectable in upper and lower respiratory specimens during the acute phase of infection. Negative results do not preclude SARS-CoV-2 infection, do not rule out co-infections with other pathogens, and should not be used as the sole basis for treatment or other patient management decisions. Negative results must be combined with clinical observations, patient history, and epidemiological information. The expected result is Negative. Fact Sheet for Patients: SugarRoll.be Fact Sheet for Healthcare Providers: https://www.woods-mathews.com/ This test is not yet approved or cleared by the [place] FDA and  has been authorized for detection and/or diagnosis of SARS-CoV-2 by FDA under an Emergency Use Authorization (EUA). This EUA will remain  in effect (meaning this  test can be used) for the duration of the COVID-19 declaration under Section 56 4(b)(1) of the Act, 21 U.S.C. section 360bbb-3(b)(1), unless the authorization is terminated or revoked sooner. Performed at [facility], [place] [address], [place], [place] [address]   Respiratory Panel by PCR     Status: None   Collection Time: 07/03/19 10:11 PM   Specimen: Nasopharyngeal Swab; Respiratory  Result Value Ref Range Status   Adenovirus NOT DETECTED NOT DETECTED Final   Coronavirus 229E NOT DETECTED NOT DETECTED Final    Comment: (NOTE) The Coronavirus on the Respiratory Panel, DOES NOT test for the novel  Coronavirus (2019 nCoV)    Coronavirus HKU1 NOT DETECTED NOT DETECTED Final   Coronavirus NL63 NOT DETECTED NOT DETECTED Final  Coronavirus OC43 NOT DETECTED NOT DETECTED Final   Metapneumovirus NOT DETECTED NOT DETECTED Final   Rhinovirus / Enterovirus NOT DETECTED NOT DETECTED Final   Influenza A NOT DETECTED NOT DETECTED Final   Influenza B NOT DETECTED NOT DETECTED Final   Parainfluenza Virus 1 NOT DETECTED NOT DETECTED Final   Parainfluenza Virus 2 NOT DETECTED NOT DETECTED Final   Parainfluenza Virus 3 NOT DETECTED NOT DETECTED Final   Parainfluenza Virus 4 NOT DETECTED NOT DETECTED Final   Respiratory Syncytial Virus NOT DETECTED NOT DETECTED Final   Bordetella pertussis NOT DETECTED NOT DETECTED Final   Chlamydophila pneumoniae NOT DETECTED NOT DETECTED Final   Mycoplasma pneumoniae NOT DETECTED NOT DETECTED Final    Comment: Performed at [facility], [place] [address] [place], [place] [address]  Respiratory Panel by RT PCR (Flu A&B, Covid) - Nasopharyngeal Swab     Status: None   Collection Time: 07/04/19  1:11 AM   Specimen: Nasopharyngeal Swab  Result Value Ref Range Status   SARS Coronavirus 2 by RT PCR NEGATIVE NEGATIVE Final    Comment: (NOTE) SARS-CoV-2 target nucleic acids are NOT DETECTED. The SARS-CoV-2 RNA is generally detectable in upper  respiratoy specimens during the acute phase of infection. The lowest concentration of SARS-CoV-2 viral copies this assay can detect is 131 copies/mL. A negative result does not preclude SARS-Cov-2 infection and should not be used as the sole basis for treatment or other patient management decisions. A negative result may occur with  improper specimen collection/handling, submission of specimen other than nasopharyngeal swab, presence of viral mutation(s) within the areas targeted by this assay, and inadequate number of viral copies (<131 copies/mL). A negative result must be combined with clinical observations, patient history, and epidemiological information. The expected result is Negative. Fact Sheet for Patients:  PinkCheek.be Fact Sheet for Healthcare Providers:  GravelBags.it This test is not yet ap proved or cleared by the [place] FDA and  has been authorized for detection and/or diagnosis of SARS-CoV-2 by FDA under an Emergency Use Authorization (EUA). This EUA will remain  in effect (meaning this test can be used) for the duration of the COVID-19 declaration under Section 564(b)(1) of the Act, 21 U.S.C. section 360bbb-3(b)(1), unless the authorization is terminated or revoked sooner.    Influenza A by PCR NEGATIVE NEGATIVE Final   Influenza B by PCR NEGATIVE NEGATIVE Final    Comment: (NOTE) The Xpert Xpress SARS-CoV-2/FLU/RSV assay is intended as an aid in  the diagnosis of influenza from Nasopharyngeal swab specimens and  should not be used as a sole basis for treatment. Nasal washings and  aspirates are unacceptable for Xpert Xpress SARS-CoV-2/FLU/RSV  testing. Fact Sheet for Patients: PinkCheek.be Fact Sheet for Healthcare Providers: GravelBags.it This test is not yet approved or cleared by the [place] FDA and  has been authorized for  detection and/or diagnosis of SARS-CoV-2 by  FDA under an Emergency Use Authorization (EUA). This EUA will remain  in effect (meaning this test can be used) for the duration of the  Covid-19 declaration under Section 564(b)(1) of the Act, 21  U.S.C. section 360bbb-3(b)(1), unless the authorization is  terminated or revoked. Performed at [facility], [place] [address] [place], [place] [address]   MRSA PCR Screening     Status: None   Collection Time: 07/04/19  7:00 AM   Specimen: Nasopharyngeal  Result Value Ref Range Status   MRSA by PCR NEGATIVE NEGATIVE Final    Comment:  The GeneXpert MRSA Assay (FDA approved for NASAL specimens only), is one component of a comprehensive MRSA colonization surveillance program. It is not intended to diagnose MRSA infection nor to guide or monitor treatment for MRSA infections. Performed at [facility], [place] [address], [place], [place] [address]   Body fluid culture     Status: None   Collection Time: 07/04/19  1:23 PM   Specimen: Pleura; Body Fluid  Result Value Ref Range Status   Specimen Description PLEURAL RIGHT  Final   Special Requests NONE  Final   Gram Stain   Final    RARE WBC PRESENT, PREDOMINANTLY MONONUCLEAR NO ORGANISMS SEEN    Culture   Final    NO GROWTH Performed at [facility], [address] [place], [place] [address]    Report Status 07/07/2019 FINAL  Final  Body fluid culture (includes gram stain)     Status: None   Collection Time: 07/04/19  1:23 PM   Specimen: Pleural Fluid  Result Value Ref Range Status   Specimen Description PLEURAL LEFT  Final   Special Requests NONE  Final   Gram Stain   Final    RARE WBC PRESENT, PREDOMINANTLY MONONUCLEAR NO ORGANISMS SEEN    Culture   Final    NO GROWTH Performed at [facility], [place] [address], [place], [place] [address]    Report Status 07/07/2019 FINAL  Final  Surgical pcr screen     Status: None   Collection Time: 07/12/19  3:31  PM   Specimen: Nasal Mucosa; Nasal Swab  Result Value Ref Range Status   MRSA, PCR NEGATIVE NEGATIVE Final   Staphylococcus aureus NEGATIVE NEGATIVE Final    Comment: (NOTE) The Xpert SA Assay (FDA approved for NASAL specimens in patients 48 years of age and older), is one component of a comprehensive surveillance program. It is not intended to diagnose infection nor to guide or monitor treatment. Performed at [facility], [place] [address] [place], [place] [address]   Fungus Culture With Stain     Status: None (Preliminary result)   Collection Time: 07/12/19  5:45 PM   Specimen: PATH Cytology Misc. fluid; Body Fluid  Result Value Ref Range Status   Fungus Stain Final report  Final    Comment: (NOTE) Performed At: [facility] [place], [place] [id number] Rush Farmer MD [id number]    Fungus (Mycology) Culture PENDING  Incomplete   Fungal Source FLUID  Final    Comment: PERICARDIAL Performed at [facility], [place] [address], [place], [place] [address]   Aerobic/Anaerobic Culture (surgical/deep wound)     Status: None   Collection Time: 07/12/19  5:45 PM   Specimen: PATH Cytology Misc. fluid; Body Fluid  Result Value Ref Range Status   Specimen Description FLUID PERICARDIAL  Final   Special Requests NONE  Final   Gram Stain NO WBC SEEN NO ORGANISMS SEEN   Final   Culture   Final    No growth aerobically or anaerobically. Performed at [facility], [place] [address] [place], [place] [address]    Report Status 07/17/2019 FINAL  Final  Acid Fast Smear (AFB)     Status: None   Collection Time: 07/12/19  5:45 PM   Specimen: PATH Cytology Misc. fluid; Body Fluid  Result Value Ref Range Status   AFB Specimen Processing Concentration  Final   Acid Fast Smear Negative  Final  Comment: (NOTE) Performed At: [facility] [place], [place] [id number] Rush Farmer MD [id number]    Source (AFB) FLUID  Final     Comment: PERICARDIAL Performed at [facility], [place] [address], [place], [place] [address]   Fungus Culture Result     Status: None   Collection Time: 07/12/19  5:45 PM  Result Value Ref Range Status   Result 1 Comment  Final    Comment: (NOTE) KOH/Calcofluor preparation:  no fungus observed. Performed At: [facility] [place], [place] [id number] Rush Farmer MD [id number]   Culture, group A strep     Status: None   Collection Time: 07/26/19 12:21 PM   Specimen: Throat  Result Value Ref Range Status   Specimen Description THROAT  Final   Special Requests NONE  Final   Culture   Final    NO GROUP A STREP (S.PYOGENES) ISOLATED Performed at [facility], [place] [address] [place], [place] [address]    Report Status 07/28/2019 FINAL  Final    Coagulation Studies: No results for input(s): LABPROT, INR in the last 72 hours.  Urinalysis: No results for input(s): COLORURINE, LABSPEC, PHURINE, GLUCOSEU, HGBUR, BILIRUBINUR, KETONESUR, PROTEINUR, UROBILINOGEN, NITRITE, LEUKOCYTESUR in the last 72 hours.  Invalid input(s): APPERANCEUR    Imaging: No results found.   Medications:   . sodium chloride    . sodium chloride    . sodium chloride    . sodium chloride    . sodium chloride    . feeding supplement (NEPRO CARB STEADY) 45 mL/hr at 08/13/19 1200  . ondansetron [facility]) IV 8 mg (08/14/19 0541)   . acetaminophen  500 mg Oral BID  . bisacodyl  10 mg Rectal Daily  . Chlorhexidine Gluconate Cloth  6 each Topical Q0600  . collagenase   Topical BID  . [START ON 08/19/2019] darbepoetin (ARANESP) injection - NON-DIALYSIS  100 mcg Subcutaneous Q Sat-1800  . diclofenac Sodium  2 g Topical BID  . famotidine  10 mg Oral Daily  . feeding supplement (ENSURE ENLIVE)  237 mL Oral BID BM  . feeding supplement (PRO-STAT SUGAR FREE 64)  30 mL Per Tube Daily  . gabapentin  100 mg Per Tube Q12H  . Gerhardt's butt cream   Topical TID  . heparin  5,000  Units Subcutaneous Q8H  . loratadine  10 mg Oral Daily  . mouth rinse  15 mL Mouth Rinse BID  . metoprolol tartrate  25 mg Oral BID  . neomycin-bacitracin-polymyxin   Topical BID  . nystatin  5 mL Mouth/Throat QID  . pantoprazole (PROTONIX) IV  40 mg Intravenous Q12H  . sertraline  25 mg Oral BID  . sodium chloride flush  3 mL Intravenous Q12H  . sodium chloride irrigation  15 mL Irrigation QID  . thiamine  100 mg Oral Daily   sodium chloride, sodium chloride, sodium chloride, sodium chloride, sodium chloride, acetaminophen (TYLENOL) oral liquid 160 mg/5 mL, ALPRAZolam, alteplase, alum & mag hydroxide-simeth, antiseptic oral rinse, bisacodyl, bisacodyl, camphor-menthol, fentaNYL (SUBLIMAZE) injection, heparin, hydrOXYzine, ondansetron (ZOFRAN) IV, oxyCODONE, polyethylene glycol, polyvinyl alcohol, senna-docusate, sodium chloride flush, witch hazel-glycerin  Assessment/ Plan:   Acute kidney injury with a creatinine of 1.43 07/03/2019 thought to be hemodynamically mediated possible hepatorenal syndrome with concomitant use of IV contrast urinalysis was unremarkable autoimmune work-up has been unremarkable however there is some consideration of scleroderma.  She was initiated on dialysis due to progressive loss of renal function  and she is anuric this was started 07/18/2019.  She continues to be dialysis dependent tentative outpatient spot at [facility].  She appears to be on a dialysis schedule of Tuesday Thursday Saturday next dialysis treatment be considered for 08/15/2019  Anemia ESA was held 08/11/2019  Malnutrition low albumin  Moderate to large pericardial effusion status post pericardial window 08/11/2018  Liver disease diagnosed in [facility] with history of steatosis.  Palliative care following   LOS: [place] @TODAY @8 :24 AM

## 2019-08-14 NOTE — Progress Notes (Addendum)
[facility]  PROGRESS NOTE  Glorious Flicker OHY:[id number] DOB: 25-Feb-1951 DOA: 07/03/2019 PCP: Renaldo Reel, DO   Brief HPI:   69 year old female with a history of hypertension, chronic pericardial effusion, moderate aortic valve regurgitation, former smoker, alcohol abuse initially admitted on 07/06/2019 for progressive shortness of breath with anasarca and fatigue.  She was previously worked up by [facility] rheumatology with negative work-up including ESR, lupus anticoagulant, rheumatoid factor, TSH, ANCA, ANA, light free chain studies.  She was referred to 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  Since that time patient had stopped drinking alcohol as advised.  She has been on diuretics and spironolactone without any improvement in symptoms.  CT of the chest on 07/04/2019 noted moderate pericardial effusion and airspace disease.  She underwent thoracentesis by PCCM which removed 700 mL of the fluid.  Patient underwent right heart cath on 07/06/2019 and pericardial window on 07/12/2019.  Attempts were made to diurese with IV diuretics but patient with worsening kidney function with creatinine up to 5.  Nephrology was formally consulted patient was started on dialysis.  She has been having poor p.o. intake so code tract was placed.  She was then later placed on PEG tube for persistent poor p.o. intake, also patient developed oral pain after NG tube placement.  She is on hemodialysis for AKI with rapid progression to end-stage renal disease, severe anasarca, pericardial effusion, status post pericardial window.  She was recently started on empiric steroids which will be weaned off.  Her labs showed RF positive at 69, INT RNA polymerase antibody +146 concern for scleroderma also she had some thickening of the skin with renal failure and pericardial involvement.  Pulmonary  was consulted to evaluate for ILD.  Dr. Regular diet discussed with rheumatology at [facility] who recommended supportive care, acid reflux treated with PPI, joint pain Voltaren gel and Tylenol.  Prednisone taper.  For Raynaud's phenomenon calcium channel blocker.  If patient has interstitial lung disease then she should be evaluated for immunotherapy can be as outpatient when stronger.  Rheumatology did not recommend immunotherapy for just joint pain and skin manifestation.    Subjective   Patient seen and examined, denies any complaints.  No chest pain or shortness of breath.  She has completed IV Solu-Medrol 125 mg daily for 3 days.   Assessment/Plan:     1. ? Scleroderma /anasarca/joint pain/pericardial effusion-patient presented with anasarca, failure to thrive, joint pains, pericardial effusion, Raynaud's phenomenon.  She had positive anti-RNA polymerase 3 antibody.  She was started on IV steroids to treat empirically for rheumatologic etiology.  Rheumatology evaluation in September 2020 showed CRP 13, ESR 25, ANA negative, c-ANCA/p-ANCA negative, scleroderma SCL 70 -, SSS (ro) negative, M spike negative electrophoresis, HIV negative.  Double-stranded DNA negative, ANA negative, RF 71.  Patient received IV Solu-Medrol 125 mg for 3 days then changed to 50 mg p.o. daily.  Rheumatology at [facility] recommended quick taper as we were considering scleroderma.  [facility] do not have bed availability at this time.  Dr. Hilma Favors discussed with rheumatologist Dr. Kathlene November, who recommended high-dose steroids, patient started on Solu-Medrol 125 mg IV for 3 days then switch to  prednisone 60 mg daily for 2 weeks and then slow taper.  She has completed IV Solu-Medrol 125 mg for 3 days.  Called and discussed with rheumatologist Dr. Kathlene November, he recommends to start 68  mg prednisone daily for a week then wean down to 50 mg daily for a week then 40 mg daily for a week then 30 mg daily.  Patient can then  continue with prednisone 30 mg daily and follow-up with rheumatology as outpatient.  Will start prednisone 60-minute daily for 1 week from today.  2. Atrial fibrillation RVR-patient was on Coreg. Patient is not a good candidate for anticoagulation considering multiple comorbid conditions, pericardial window.  Cardiology has been following patient since her hospitalization.  Appreciate cardiology recommendations.  Coreg has been changed to metoprolol.  No anticoagulation at this time.  Heart rate is controlled on metoprolol 25 mg p.o. twice daily  3. Leukopenia/? Malignancy -resolved, today WBC is 4.2.  Patient developed leukopenia, oncology was consulted.  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 Jana Hakim saw the patient and does not recommend further search for malignancy.  As patient had extensive imaging this admission with no malignancy identified, CT chest noncontrast yesterday was negative, CT abdomen/pelvis on 1/16 was unremarkable and repeat SPEP showed no monoclonal protein with normal kappa lambda ratio.  She also had negative pleural fluid cytology x4.  Screening malignancy can be done as outpatient.  4. ESRD/intermittent hyperkalemia- new on HD-patient has progressively AKI found to be exacerbated by contrast-induced nephropathy.  She was started on hemodialysis with improvement of anasarca.  Outpatient dialysis has been set up.  Next dialysis scheduled for tomorrow.  5. Lethargy/encephalopathy-resolved, patient became lethargic and hemodialysis on 08/02/2019 she was receiving Ativan at that time.  Fentanyl was stopped, opiates were decreased.  Ammonia level 29, she received IV thiamine 250 mg 3 times daily for 3 days.  Thiamine level is 192.5.  6. Acute hypoxic respiratory failure/pulmonary edema/pleural effusion-stable, she has been weaned off oxygen.  Currently 100% on room air.  She was started on IV Solu-Medrol by palliative care for presumed autoimmune process.  She had  worsening dyspnea with pleural effusion, she underwent repeat thoracentesis on 08/01/2019.  Removed 1.1 L from left and 500 mL from right.  Pleural cytology showed reactive mesothelial cells.  Pleural fluid culture showed no growth.  PCCM was consulted for possible ILD.  7. Pericardial effusion with elevated troponin-patient is status post pericardial window on 07/12/2019.  Pathology showed no malignancy, chronic inflammation, AFB negative.  Fungal culture came back negative.  8. Atypical pneumonia with hypoxia-completed treatment.  9. Severe malnutrition/failure to thrive-continue with tube feeding.  Speech therapy consulted, and patient started on regular diet.  10. Dysphagia/thrush-group A strep was negative on 07/28/2019, patient currently on oral nystatin, oral Diflucan.  11. Anxiety-continue Zoloft, started on Xanax 0.25 mg 3 times daily as needed for panic attack.    12. Back pain-continue with oxycodone as needed, Voltaren gel twice a day.  13. Hypertension-continue Coreg, Norvasc.    SpO2: 100 % O2 Flow Rate (L/min): 2 L/min     Lab Results  Component Value Date   SARSCOV2NAA NEGATIVE 07/04/2019   SARSCOV2NAA NEGATIVE 07/03/2019   SARSCOV2NAA NEGATIVE 04/04/2019   SARSCOV2NAA NOT DETECTED 03/25/2019     CBG: Recent Labs  Lab 08/13/19 1536 08/13/19 2035 08/13/19 2337 08/14/19 0503 08/14/19 0843  GLUCAP 105* 84 92 85 105*    CBC: Recent Labs  Lab 08/08/19 1052 08/09/19 0722 08/10/19 0626 08/11/19 0234 08/12/19 0441  WBC 1.4* 1.0* 1.5* 1.6* 4.2  NEUTROABS 0.6*  --  0.6*  --   --   HGB 10.9* 10.5* 12.1 11.5* 12.4  HCT 36.8 34.8* 41.7 39.8 42.9  MCV 96.1 96.1 96.5 96.8 94.9  PLT 291 301 251 234 017    Basic Metabolic Panel: Recent Labs  Lab 08/10/19 0626 08/11/19 0234 08/12/19 0441 08/13/19 0734 08/14/19 0603  NA 133* 131* 133* 133* 137  K 4.0 4.8 4.5 5.6* 4.4  CL 95* 92* 96* 92* 95*  CO2 21* 21* 20* 21* 22  GLUCOSE 118* 140* 148* 130* 45*  BUN  70* 108* 79* 153* 82*  CREATININE 2.75* 3.64* 2.71* 3.78* 2.49*  CALCIUM 9.2 9.3 9.3 9.7 9.2  PHOS 4.5 5.8* 4.6 5.7* 4.1     Liver Function Tests: Recent Labs  Lab 08/10/19 0626 08/11/19 0234 08/12/19 0441 08/13/19 0734 08/14/19 0603  ALBUMIN 3.1* 3.0* 3.1* 3.2* 3.1*        DVT prophylaxis: Heparin  Code Status: Full code  Family Communication: No family at bedside  Disposition Plan: likely skilled nursing facility versus CIR  Pressure Injury 07/19/19 Sacrum Posterior;Mid Stage 2 -  Partial thickness loss of dermis presenting as a shallow open injury with a red, pink wound bed without slough. (Active)  07/19/19 0600  Location: Sacrum  Location Orientation: Posterior;Mid  Staging: Stage 2 -  Partial thickness loss of dermis presenting as a shallow open injury with a red, pink wound bed without slough.  Wound Description (Comments):   Present on Admission: No     Pressure Injury 07/22/19 Nare Right;Left Stage 2 -  Partial thickness loss of dermis presenting as a shallow open injury with a red, pink wound bed without slough. Injury r/t cortrak (Active)  07/22/19 1000  Location: Nare  Location Orientation: Right;Left  Staging: Stage 2 -  Partial thickness loss of dermis presenting as a shallow open injury with a red, pink wound bed without slough.  Wound Description (Comments): Injury r/t cortrak  Present on Admission: No        Scheduled medications:  . acetaminophen  500 mg Oral BID  . bisacodyl  10 mg Rectal Daily  . Chlorhexidine Gluconate Cloth  6 each Topical Q0600  . collagenase   Topical BID  . [START ON 08/19/2019] darbepoetin (ARANESP) injection - NON-DIALYSIS  100 mcg Subcutaneous Q Sat-1800  . diclofenac Sodium  2 g Topical BID  . famotidine  10 mg Oral Daily  . feeding supplement (ENSURE ENLIVE)  237 mL Oral BID BM  . feeding supplement (PRO-STAT SUGAR FREE 64)  30 mL Per Tube Daily  . gabapentin  100 mg Per Tube Q12H  . Gerhardt's butt cream    Topical TID  . heparin  5,000 Units Subcutaneous Q8H  . loratadine  10 mg Oral Daily  . mouth rinse  15 mL Mouth Rinse BID  . metoprolol tartrate  25 mg Oral BID  . neomycin-bacitracin-polymyxin   Topical BID  . nystatin  5 mL Mouth/Throat QID  . pantoprazole sodium  40 mg Per Tube BID  . sertraline  25 mg Oral BID  . sodium chloride flush  3 mL Intravenous Q12H  . sodium chloride irrigation  15 mL Irrigation QID  . thiamine  100 mg Oral Daily    Consultants:  Oncology  Nephrology  Palliative care  Procedures:    Antibiotics:   Anti-infectives (From admission, onward)   Start     Dose/Rate Route Frequency Ordered Stop   07/29/19 1500  fluconazole (DIFLUCAN) tablet 200 mg     200 mg Oral Daily 07/29/19 1458 08/11/19 2116   07/28/19 1300  ceFAZolin (ANCEF) 2-4 GM/100ML-% IVPB  Note to Pharmacy: Kandy Garrison   : cabinet override      07/28/19 1300 07/28/19 1806   07/28/19 0700  ceFAZolin (ANCEF) IVPB 2g/100 mL premix     2 g 200 mL/hr over 30 Minutes Intravenous On call 07/27/19 1502 07/28/19 1340   07/18/19 1316  ceFAZolin (ANCEF) 2-4 GM/100ML-% IVPB    Note to Pharmacy: Manuela Neptune   : cabinet override      07/18/19 1316 07/18/19 1318   07/12/19 2115  ceFAZolin (ANCEF) IVPB 1 g/50 mL premix     1 g 100 mL/hr over 30 Minutes Intravenous Every 8 hours 07/12/19 2102 07/13/19 0516   07/03/19 2245  cefTRIAXone (ROCEPHIN) 2 g in sodium chloride 0.9 % 100 mL IVPB  Status:  Discontinued     2 g 200 mL/hr over 30 Minutes Intravenous Every 24 hours 07/03/19 2230 07/04/19 1336   07/03/19 2245  azithromycin (ZITHROMAX) 500 mg in sodium chloride 0.9 % 250 mL IVPB  Status:  Discontinued     500 mg 250 mL/hr over 60 Minutes Intravenous Every 24 hours 07/03/19 2230 07/05/19 1037       Objective   Vitals:   08/13/19 2338 08/14/19 0423 08/14/19 0424 08/14/19 0737  BP: 128/86   140/85  Pulse: 87   93  Resp: 20   18  Temp: 98 F (36.7 C) 97.8 F (36.6 C)  97.6 F  (36.4 C)  TempSrc: Axillary Oral  Axillary  SpO2: 100%   100%  Weight:   39.5 kg   Height:        Intake/Output Summary (Last 24 hours) at 08/14/2019 1012 Last data filed at 08/13/2019 2121 Gross per 24 hour  Intake 183 ml  Output 1254 ml  Net -1071 ml    01/23 1901 - 01/25 0700 In: 790 [I.V.:12] Out: 1254   Filed Weights   08/13/19 1315 08/13/19 1630 08/14/19 0424  Weight: 41.3 kg 40.3 kg 39.5 kg    Physical Examination:   General-appears in no acute distress Heart-S1-S2, regular, no murmur auscultated Lungs-clear to auscultation bilaterally Abdomen-soft, nontender, no organomegaly Extremities-no edema in the lower extremities Neuro-alert, oriented x3, no focal deficit noted  Data Reviewed:     Cardiac Enzymes: No results for input(s): CKTOTAL, CKMB, CKMBINDEX, TROPONINI in the last 168 hours. BNP (last 3 results) Recent Labs    04/04/19 2000 07/03/19 1930 07/09/19 0347  BNP 76.7 1,124.1* 4,004.9*     Studies:  No results found.   Admission status: Inpatient: Based on patients clinical presentation and evaluation of above clinical data, I have made determination that patient meets Inpatient criteria at this time.   Oswald Hillock   [facility] If 7PM-7AM, please contact night-coverage at [email or web address], Office  [phone]  password [email or web address]  08/14/2019, 10:12 AM  LOS: 41 days

## 2019-08-14 NOTE — Progress Notes (Signed)
Physical Therapy Treatment Patient Details Name: Joanna Ali MRN: [id number] DOB: [birth year] Today's Date: 08/14/2019    History of Present Illness Pt is a 69 y.o. female admitted 07/03/19 with anasarca, fatigue and tachycardia. 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 Pt with pericardial effusion s/p pericardial window. PEG tube placed. PMH includes former tobacco and alcohol abuse, HTN, chronic pleural effusion.    PT Comments    Patient seen for mobility progression. Pt in bed upon arrival and agreeable to participate in therapy. Pt requires min A for bed mobility and min A +2 for functional transfer/gait training. Pt tolerated gait distance of 10 ft before fatigued. VSS on RA. Pt c/o painful buttocks and has geo mat cushion in recliner to relieve pressure on sacrum. Continue to progress as tolerated.     Follow Up Recommendations  SNF;Supervision/Assistance - 24 hour(if aligning with goals of care)     Equipment Recommendations  Wheelchair (measurements PT);Wheelchair cushion (measurements PT);Hospital bed(if going home)    Recommendations for Other Services Rehab consult     Precautions / Restrictions Precautions Precautions: Fall Precaution Comments: PEG tube    Mobility  Bed Mobility Overal bed mobility: Needs Assistance Bed Mobility: Rolling;Sidelying to Sit Rolling: Modified independent (Device/Increase time) Sidelying to sit: Min assist       General bed mobility comments: pt able to roll onto R side with use of rail; assist to elevate trunk into sitting   Transfers Overall transfer level: Needs assistance Equipment used: Rolling walker (2 wheeled) Transfers: Sit to/from Stand Sit to Stand: Min assist;+2 safety/equipment         General transfer comment: cues for safe use of RW; assist to power up into standing   Ambulation/Gait Ambulation/Gait assistance: Min assist;+2 safety/equipment(chair follow) Gait Distance (Feet): 10  Feet Assistive device: Rolling walker (2 wheeled) Gait Pattern/deviations: Trunk flexed;Step-through pattern;Decreased stride length Gait velocity: decreased   General Gait Details: cues for maintaining safe proximity to RW and upright posture/forward gaze; limited by fatigue so chair follow close for safety   Stairs             Wheelchair Mobility    Modified Rankin (Stroke Patients Only)       Balance Overall balance assessment: Needs assistance Sitting-balance support: Single extremity supported;Feet supported Sitting balance-Leahy Scale: Fair     Standing balance support: Bilateral upper extremity supported Standing balance-Leahy Scale: Poor                              Cognition Arousal/Alertness: Awake/alert Behavior During Therapy: WFL for tasks assessed/performed Overall Cognitive Status: Within Functional Limits for tasks assessed                                        Exercises      General Comments General comments (skin integrity, edema, etc.): pt has geomat cushion in chair; VSS on RA      Pertinent Vitals/Pain Pain Assessment: Faces Faces Pain Scale: Hurts little more Pain Location: buttocks Pain Descriptors / Indicators: Sore Pain Intervention(s): Monitored during session;Repositioned    Home Living                      Prior Function            PT Goals (current goals can now be found  in the care plan section) Progress towards PT goals: Progressing toward goals    Frequency    Min 2X/week      PT Plan Current plan remains appropriate    Co-evaluation              AM-PAC PT "6 Clicks" Mobility   Outcome Measure  Help needed turning from your back to your side while in a flat bed without using bedrails?: A Lot Help needed moving from lying on your back to sitting on the side of a flat bed without using bedrails?: A Lot Help needed moving to and from a bed to a chair (including a  wheelchair)?: A Little Help needed standing up from a chair using your arms (e.g., wheelchair or bedside chair)?: A Little Help needed to walk in hospital room?: A Little Help needed climbing 3-5 steps with a railing? : Total 6 Click Score: 14    End of Session Equipment Utilized During Treatment: Gait belt Activity Tolerance: Patient tolerated treatment well Patient left: in chair;with call bell/phone within reach;with family/visitor present Nurse Communication: Mobility status PT Visit Diagnosis: Unsteadiness on feet (R26.81);Muscle weakness (generalized) (M62.81);Difficulty in walking, not elsewhere classified (R26.2);Pain Pain - Right/Left: (central) Pain - part of body: (sacrum)     Time: 0165-5374 PT Time Calculation (min) (ACUTE ONLY): 20 min  Charges:  $Gait Training: 8-22 mins                     Earney Navy, PTA Acute Rehabilitation Services Pager: [phone] Office: [phone]     Darliss Cheney 08/14/2019, 4:03 PM

## 2019-08-14 NOTE — Progress Notes (Signed)
Cardiology Progress Note  Patient ID: Joanna Ali MRN: [id number] DOB: Dec 11, 1950 Date of Encounter: 08/14/2019  Primary Cardiologist: Evalina Field, MD  Subjective  No complaints this morning.  Appears frail and reports her appetite is not good.  Review of telemetry shows normal sinus rhythm with frequent PACs.  There was a brief run of ectopic atrial tachycardia.  This was less than 10 beats.  ROS:  All other ROS reviewed and negative. Pertinent positives noted in the HPI.     Inpatient Medications  Scheduled Meds: . acetaminophen  500 mg Oral BID  . bisacodyl  10 mg Rectal Daily  . Chlorhexidine Gluconate Cloth  6 each Topical Q0600  . collagenase   Topical BID  . [START ON 08/19/2019] darbepoetin (ARANESP) injection - NON-DIALYSIS  100 mcg Subcutaneous Q Sat-1800  . diclofenac Sodium  2 g Topical BID  . famotidine  10 mg Oral Daily  . feeding supplement (ENSURE ENLIVE)  237 mL Oral BID BM  . feeding supplement (PRO-STAT SUGAR FREE 64)  30 mL Per Tube Daily  . gabapentin  100 mg Per Tube Q12H  . Gerhardt's butt cream   Topical TID  . heparin  5,000 Units Subcutaneous Q8H  . loratadine  10 mg Oral Daily  . mouth rinse  15 mL Mouth Rinse BID  . metoprolol tartrate  25 mg Oral BID  . neomycin-bacitracin-polymyxin   Topical BID  . nystatin  5 mL Mouth/Throat QID  . pantoprazole sodium  40 mg Per Tube BID  . sertraline  25 mg Oral BID  . sodium chloride flush  3 mL Intravenous Q12H  . sodium chloride irrigation  15 mL Irrigation QID  . thiamine  100 mg Oral Daily   Continuous Infusions: . sodium chloride    . sodium chloride    . sodium chloride    . sodium chloride    . sodium chloride    . feeding supplement (NEPRO CARB STEADY) 45 mL/hr at 08/13/19 1200  . ondansetron [facility]) IV 8 mg (08/14/19 0541)   PRN Meds: sodium chloride, sodium chloride, sodium chloride, sodium chloride, sodium chloride, acetaminophen (TYLENOL) oral liquid 160 mg/5 mL, ALPRAZolam, alteplase,  alum & mag hydroxide-simeth, antiseptic oral rinse, bisacodyl, bisacodyl, camphor-menthol, fentaNYL (SUBLIMAZE) injection, heparin, hydrOXYzine, ondansetron (ZOFRAN) IV, oxyCODONE, polyethylene glycol, polyvinyl alcohol, senna-docusate, sodium chloride flush, witch hazel-glycerin   Vital Signs   Vitals:   08/13/19 2338 08/14/19 0423 08/14/19 0424 08/14/19 0737  BP: 128/86   140/85  Pulse: 87   93  Resp: 20   18  Temp: 98 F (36.7 C) 97.8 F (36.6 C)  97.6 F (36.4 C)  TempSrc: Axillary Oral  Axillary  SpO2: 100%   100%  Weight:   39.5 kg   Height:        Intake/Output Summary (Last 24 hours) at 08/14/2019 0911 Last data filed at 08/13/2019 2121 Gross per 24 hour  Intake 189 ml  Output 1254 ml  Net -1065 ml   Last 3 Weights 08/14/2019 08/13/2019 08/13/2019  Weight (lbs) 87 lb 88 lb 13.5 oz 91 lb  Weight (kg) 39.463 kg 40.3 kg 41.277 kg      Telemetry  Overnight telemetry shows normal sinus rhythm heart rate in the 90s, frequent PACs, ectopic atrial tachycardia observed overnight, which I personally reviewed.   ECG  The most recent ECG shows normal sinus rhythm, heart rate 89, EKG dated 08/09/2019, which I personally reviewed.   Physical Exam   Vitals:  08/13/19 2338 08/14/19 0423 08/14/19 0424 08/14/19 0737  BP: 128/86   140/85  Pulse: 87   93  Resp: 20   18  Temp: 98 F (36.7 C) 97.8 F (36.6 C)  97.6 F (36.4 C)  TempSrc: Axillary Oral  Axillary  SpO2: 100%   100%  Weight:   39.5 kg   Height:         Intake/Output Summary (Last 24 hours) at 08/14/2019 0911 Last data filed at 08/13/2019 2121 Gross per 24 hour  Intake 189 ml  Output 1254 ml  Net -1065 ml    Last 3 Weights 08/14/2019 08/13/2019 08/13/2019  Weight (lbs) 87 lb 88 lb 13.5 oz 91 lb  Weight (kg) 39.463 kg 40.3 kg 41.277 kg    Body mass index is 14.04 kg/m.  General: Frail, ill-appearing Head: Atraumatic, normal size  Eyes: PEERLA, EOMI  Neck: Supple, no JVD Endocrine: No thryomegaly Cardiac:  Normal S1, S2; RRR; no murmurs, rubs, or gallops Lungs: Diminished breath sounds bilaterally at the lung bases Abd: Soft, nontender, no hepatomegaly  Ext: No edema, pulses 2+ Musculoskeletal: No deformities, BUE and BLE strength normal and equal Skin: Warm and dry, no rashes   Neuro: Alert and oriented to person, place, time, and situation, CNII-XII grossly intact, no focal deficits  Psych: Normal mood and affect   Labs  High Sensitivity Troponin:  No results for input(s): TROPONINIHS in the last 720 hours.   Cardiac EnzymesNo results for input(s): TROPONINI in the last 168 hours. No results for input(s): TROPIPOC in the last 168 hours.  Chemistry Recent Labs  Lab 08/12/19 0441 08/13/19 0734 08/14/19 0603  NA 133* 133* 137  K 4.5 5.6* 4.4  CL 96* 92* 95*  CO2 20* 21* 22  GLUCOSE 148* 130* 45*  BUN 79* 153* 82*  CREATININE 2.71* 3.78* 2.49*  CALCIUM 9.3 9.7 9.2  ALBUMIN 3.1* 3.2* 3.1*  GFRNONAA 17* 11* 19*  GFRAA 20* 13* 22*  ANIONGAP 17* 20* 20*    Hematology Recent Labs  Lab 08/10/19 0626 08/10/19 0630 08/11/19 0234 08/12/19 0441  WBC 1.5*  --  1.6* 4.2  RBC 4.32 4.27 4.11 4.52  HGB 12.1  --  11.5* 12.4  HCT 41.7  --  39.8 42.9  MCV 96.5  --  96.8 94.9  MCH 28.0  --  28.0 27.4  MCHC 29.0*  --  28.9* 28.9*  RDW 20.8*  --  20.1* 20.8*  PLT 251  --  234 252   BNPNo results for input(s): BNP, PROBNP in the last 168 hours.  DDimer No results for input(s): DDIMER in the last 168 hours.   Radiology  No results found.  Cardiac Studies  TTE 07/17/2019   1. Left ventricular ejection fraction, by visual estimation, is 50 to 55%. The left ventricle has low normal function. There is mildly increased left ventricular hypertrophy.  2. Moderate hypokinesis of the left ventricular, entire septal wall.  3. The left ventricle demonstrates regional wall motion abnormalities.  4. Global right ventricle has normal systolic function.The right ventricular size is normal. No  increase in right ventricular wall thickness.  5. Left atrial size was normal.  6. Right atrial size was mildly dilated.  7. Moderate pleural effusion in the left lateral region.  8. The pericardial effusion is circumferential.  9. Trivial pericardial effusion is present. 10. The mitral valve is abnormal. Mild mitral valve regurgitation. 11. The tricuspid valve is grossly normal. Tricuspid valve regurgitation moderate. 12. Aortic valve  regurgitation is moderate. 13. The aortic valve is tricuspid. Aortic valve regurgitation is moderate. Mild aortic valve sclerosis without stenosis. 14. The pulmonic valve was grossly normal. Pulmonic valve regurgitation is not visualized. 15. Moderately elevated pulmonary artery systolic pressure. 16. The inferior vena cava is dilated in size with >50% respiratory variability, suggesting right atrial pressure of 8 mmHg.  Patient Profile   69 y.o. female with PMH of HTN, OA, pericardial effusion, anasarca 2/2 liver disease, and moderate AI who was sent to the hospital by Dr. Audie Box on 07/03/2019 for worsening dyspnea and atrial tachycardia with HR 130s. CTA negative PE, but showed moderate pericardial effusion and airspace disease concerning for infection with superimposed edema. Bedside echo demonstrated large pericardial effusion. She underwent IV diuresis, renal function subsequently worsened. PYP scan negative for amyloid. Right heart cath performed on 12/17 showed mild PAH, CO 5.0, CI 3.2. 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 Echo 12/20 showed EF 45-50%, no temponade, moderate to severe AI, enlarging pericardial effusion. She underwent pericardial window by Dr. Wyvonnia Lora 07/12/2019. Repeat echo 12/28 EF 50-55%, mid MR, moderate AI. Worsening renal function (Cr 0.67 on arrival, 5.1 by 12/29) resulted in initiation of dialysis on 12/29. Tunneled HD catheter placed 12/29. G tube placed. Thoracentesis  performed on 1/12 due to worsening dyspnea, 1100 ml light amber pleural fluid removed. Started on steroid by palliative care to stimulate appetite and reduce inflammatory pain and possibly undiagnosed rheumatological disorder. She had an episode of unresponsiveness on 1/14 after dialysis and second dose of ativan, this quickly resolved and felt to be overmedication.  Course has been complicated by paroxysmal atrial fibrillation.  She is now been anticoagulated due to perceived high bleeding risk.  Assessment & Plan   1.  Paroxysmal atrial fibrillation -Telemetry overnight with normal sinus rhythm frequent PACs.  She did have a run of ectopic atrial tachycardia.  No recurrence of atrial fibrillation that I can see. -Maintaining sinus rhythm with Metroprolol tartrate 25 mg twice daily will continue. -Eliquis is an option for her per the recent 2019 ACC/AHA update on the management of atrial fibrillation guidelines.  She is not limited to Coumadin despite her ESRD.  However, she has a high perceived bleeding risk and is quite frail.  I am reluctant to start anticoagulation currently.  For now we will hold.  2.  Anasarca/pericardial effusion -Extensive work-up here.  Her right and left heart catheterization were consistent with tamponade.  Her PYP scan was negative for amyloid.  Her most recent echo shows low normal ejection fraction 50 to 55%.  She has moderate aortic insufficiency.  She is status post pericardial window. -Really unclear etiology of her anasarca and volume overload.  It has been attributed to combination of liver disease and severe hypoalbuminemia.  Her initial rheumatologic work-up in September was negative.  Here the only work-up that is positive is concerns for possible rheumatoid arthritis versus third hour.  She is a positive anti-RNA-polymerase 3 antibody.  Hematology is concerned for some underlying malignancy that is possibly causing scleroderma secondary picture.  She is on empiric  steroids.  She is awaiting transfer to possible [facility] or [place].  For now, will defer management to hospital medicine team.  Unfortunately, we do not have in-house rheumatology consultation.  3.  AKI now on hemodialysis -Volume is managed by nephrology.  She is set up for outpatient hemodialysis.  4.  Moderate aortic insufficiency -No need for intervention at  this time  For questions or updates, please contact [place] Please consult [email or web address] for contact info under   Signed, [place] T. Audie Box, [place]  08/14/2019 9:11 AM

## 2019-08-14 NOTE — Progress Notes (Signed)
Renal Navigator received notification from OP HD clinic/[place] that since patient is still far from discharge and readiness to start in OP HD clinic, they will cancel patient's seat today and request that Renal Navigator make new referral closer to time of need. Renal Navigator will discuss with Dr. Webb/Nephrologist and ask to be informed when patient is nearing discharge so that a new referral for OP HD treatment can be made.  Alphonzo Cruise, [place] Renal Navigator [phone]

## 2019-08-15 DIAGNOSIS — R0989 Other specified symptoms and signs involving the circulatory and respiratory systems: Secondary | ICD-10-CM

## 2019-08-15 DIAGNOSIS — M340 Progressive systemic sclerosis: Secondary | ICD-10-CM

## 2019-08-15 DIAGNOSIS — R230 Cyanosis: Secondary | ICD-10-CM

## 2019-08-15 DIAGNOSIS — M069 Rheumatoid arthritis, unspecified: Secondary | ICD-10-CM

## 2019-08-15 DIAGNOSIS — I998 Other disorder of circulatory system: Secondary | ICD-10-CM

## 2019-08-15 DIAGNOSIS — M341 CR(E)ST syndrome: Secondary | ICD-10-CM

## 2019-08-15 LAB — RENAL FUNCTION PANEL
Albumin: 3 g/dL — ABNORMAL LOW (ref 3.5–5.0)
Anion gap: 20 — ABNORMAL HIGH (ref 5–15)
BUN: 128 mg/dL — ABNORMAL HIGH (ref 8–23)
CO2: 22 mmol/L (ref 22–32)
Calcium: 9.5 mg/dL (ref 8.9–10.3)
Chloride: 93 mmol/L — ABNORMAL LOW (ref 98–111)
Creatinine, Ser: 3.5 mg/dL — ABNORMAL HIGH (ref 0.44–1.00)
GFR calc Af Amer: 15 mL/min — ABNORMAL LOW (ref >60)
GFR calc non Af Amer: 13 mL/min — ABNORMAL LOW (ref >60)
Glucose, Bld: 89 mg/dL (ref 70–99)
Phosphorus: 4.9 mg/dL — ABNORMAL HIGH (ref 2.5–4.6)
Potassium: 5.2 mmol/L — ABNORMAL HIGH (ref 3.5–5.1)
Sodium: 135 mmol/L (ref 135–145)

## 2019-08-15 LAB — ANTI-DNASE B ANTIBODY: Anti-DNAse-B: <78 U/mL (ref 0–120)

## 2019-08-15 LAB — GLUCOSE, CAPILLARY
Glucose-Capillary: 111 mg/dL — ABNORMAL HIGH (ref 70–99)
Glucose-Capillary: 112 mg/dL — ABNORMAL HIGH (ref 70–99)
Glucose-Capillary: 88 mg/dL (ref 70–99)
Glucose-Capillary: 94 mg/dL (ref 70–99)
Glucose-Capillary: 99 mg/dL (ref 70–99)

## 2019-08-15 LAB — FUNGUS CULTURE WITH STAIN

## 2019-08-15 LAB — CBC
HCT: 43.9 % (ref 36.0–46.0)
Hemoglobin: 12.9 g/dL (ref 12.0–15.0)
MCH: 27.6 pg (ref 26.0–34.0)
MCHC: 29.4 g/dL — ABNORMAL LOW (ref 30.0–36.0)
MCV: 94 fL (ref 80.0–100.0)
Platelets: 174 10*3/uL (ref 150–400)
RBC: 4.67 MIL/uL (ref 3.87–5.11)
RDW: 20.6 % — ABNORMAL HIGH (ref 11.5–15.5)
WBC: 8.2 10*3/uL (ref 4.0–10.5)
nRBC: 0.4 % — ABNORMAL HIGH (ref 0.0–0.2)

## 2019-08-15 LAB — FUNGAL ORGANISM REFLEX

## 2019-08-15 LAB — FUNGUS CULTURE RESULT

## 2019-08-15 MED ORDER — FENTANYL CITRATE (PF) 100 MCG/2ML IJ SOLN
INTRAMUSCULAR | Status: AC
  Administered 2019-08-15: 09:00:00 25 ug via INTRAVENOUS
  Filled 2019-08-15: qty 2

## 2019-08-15 MED ORDER — DILTIAZEM HCL ER COATED BEADS 120 MG PO CP24
120.0000 mg | ORAL_CAPSULE | Freq: Every day | ORAL | Status: DC
  Administered 2019-08-15: 120 mg via ORAL
  Filled 2019-08-15: qty 1

## 2019-08-15 MED ORDER — DARBEPOETIN ALFA 100 MCG/0.5ML IJ SOSY: 100.0000 ug | PREFILLED_SYRINGE | INTRAMUSCULAR | Status: DC

## 2019-08-15 MED ORDER — RENA-VITE PO TABS
1.0000 | ORAL_TABLET | Freq: Every day | ORAL | Status: DC
  Administered 2019-08-15 – 2019-08-22 (×8): 1 via ORAL
  Filled 2019-08-15 (×8): qty 1

## 2019-08-15 MED ORDER — FENTANYL CITRATE (PF) 100 MCG/2ML IJ SOLN
INTRAMUSCULAR | Status: AC
  Filled 2019-08-15: qty 2

## 2019-08-15 MED ORDER — PREDNISONE 50 MG PO TABS
50.0000 mg | ORAL_TABLET | Freq: Every day | ORAL | Status: DC
  Administered 2019-08-22 – 2019-08-23 (×2): 50 mg via ORAL
  Filled 2019-08-15 (×2): qty 1

## 2019-08-15 MED ORDER — HEPARIN SODIUM (PORCINE) 1000 UNIT/ML IJ SOLN
INTRAMUSCULAR | Status: AC
  Filled 2019-08-15: qty 4

## 2019-08-15 MED ORDER — PREDNISONE 20 MG PO TABS: 40.0000 mg | ORAL_TABLET | Freq: Every day | ORAL | Status: DC

## 2019-08-15 MED ORDER — PREDNISONE 50 MG PO TABS
60.0000 mg | ORAL_TABLET | Freq: Every day | ORAL | Status: AC
  Administered 2019-08-16 – 2019-08-21 (×6): 60 mg via ORAL
  Filled 2019-08-15 (×6): qty 1

## 2019-08-15 MED ORDER — PREDNISONE 20 MG PO TABS: 30.0000 mg | ORAL_TABLET | Freq: Every day | ORAL | Status: DC

## 2019-08-15 NOTE — Progress Notes (Signed)
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 Kipp Brood 07/12/2019.  Initial serological evaluation for autoimmune diseases was essentially negative, although she did have a positive anti-RNA polymerase 3 antibody.  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  Hematology raised the concern of a malignancy or scleroderma although an extensive search for malignancy was not recommended by Dr. Jana Hakim.  Initial plans were for transfer to tertiary center however this did not occur due to lack of bed availability.  She was initially started on dialysis 07/18/2019 due to edema that failed to resolve with IV diuretics and worsening serum creatinine.  She dialyzes with a tunneled dialysis catheter  Her last dialysis 08/12/2018 with 1.2 L removal, she was seen on dialysis 08/15/2019  Blood pressure 133/85 pulse 84 temperature 97.6 and O2 sats of 98% on room air  Sodium 135 potassium 5.2 chloride 93 CO2 22 BUN 128 creatinine 3.5 glucose 89 calcium 9.5 phosphorus 4.9 albumin 3.0 WBC 8.2 hemoglobin 12.9 platelets 174  Neurontin 100 mg twice daily Claritin 10 mg daily, Lopressor 25 mg twice daily, Protonix 40 mg twice daily, Zoloft 25 mg daily.  Darbepoetin being held due to high hemoglobin  Objective:  Vital signs in last 24 hours:  Temp:  [96.3 F (35.7 C)-98.2 F (36.8 C)] 97.6 F (36.4 C) (01/26 0704) Pulse Rate:  [75-88] 84 (01/26 0730) Resp:  [15-22] 15 (01/26 0704) BP: (121-161)/(74-100) 133/85 (01/26 0730) SpO2:  [98 %-100 %] 98 % (01/26 0704) Weight:  [37.6 kg-42.2 kg] 42.2 kg (01/26 0704)  Weight change: -3.629 kg Filed  Weights   08/14/19 0424 08/15/19 0257 08/15/19 0704  Weight: 39.5 kg 37.6 kg 42.2 kg    Intake/Output: I/O last 3 completed shifts: In: 9 [I.V.:9] Out: -    Intake/Output this shift:  No intake/output data recorded. Cachectic and frail CVS- RRR JVP not elevated left tunneled dialysis catheter RS- CTA no wheezes or rales ABD- BS present soft non-distended EXT- no edema   Basic Metabolic Panel: Recent Labs  Lab 08/11/19 0234 08/11/19 0234 08/12/19 0441 08/12/19 0441 08/13/19 0734 08/14/19 0603 08/15/19 0251  NA 131*  --  133*  --  133* 137 135  K 4.8  --  4.5  --  5.6* 4.4 5.2*  CL 92*  --  96*  --  92* 95* 93*  CO2 21*  --  20*  --  21* 22 22  GLUCOSE 140*  --  148*  --  130* 45* 89  BUN 108*  --  79*  --  153* 82* 128*  CREATININE 3.64*  --  2.71*  --  3.78* 2.49* 3.50*  CALCIUM 9.3   < > 9.3   < > 9.7 9.2 9.5  PHOS 5.8*  --  4.6  --  5.7* 4.1 4.9*   < > = values in this interval not displayed.    Liver Function Tests: Recent Labs  Lab 08/11/19 0234 08/12/19 0441 08/13/19 0734 08/14/19 0603 08/15/19 0251  ALBUMIN 3.0* 3.1* 3.2* 3.1* 3.0*   No results for input(s): LIPASE, AMYLASE in the last 168 hours. No results for input(s): AMMONIA in the last 168 hours.  CBC:  Recent Labs  Lab 08/08/19 1052 08/08/19 1052 08/09/19 0722 08/10/19 0626 08/11/19 0234 08/12/19 0441 08/15/19 0251  WBC 1.4*   < > 1.0* 1.5* 1.6* 4.2 8.2  NEUTROABS 0.6*  --   --  0.6*  --   --   --   HGB 10.9*   < > 10.5* 12.1 11.5* 12.4 12.9  HCT 36.8   < > 34.8* 41.7 39.8 42.9 43.9  MCV 96.1   < > 96.1 96.5 96.8 94.9 94.0  PLT 291   < > 301 251 234 252 174   < > = values in this interval not displayed.    Cardiac Enzymes: No results for input(s): CKTOTAL, CKMB, CKMBINDEX, TROPONINI in the last 168 hours.  BNP: Invalid input(s): POCBNP  CBG: Recent Labs  Lab 08/14/19 1109 08/14/19 1618 08/14/19 1955 08/15/19 0010 08/15/19 0414  GLUCAP 108* 123* 114* 112* 94     Microbiology: Results for orders placed or performed during the hospital encounter of 07/03/19  Culture, blood (routine x 2)     Status: None   Collection Time: 07/03/19  7:28 PM   Specimen: BLOOD RIGHT FOREARM  Result Value Ref Range Status   Specimen Description BLOOD RIGHT FOREARM  Final   Special Requests   Final    BOTTLES DRAWN AEROBIC AND ANAEROBIC Blood Culture adequate volume   Culture   Final    NO GROWTH 5 DAYS Performed at [facility], [place] [address], [place], [place] [address]    Report Status 07/08/2019 FINAL  Final  Culture, blood (routine x 2)     Status: None   Collection Time: 07/03/19  7:34 PM   Specimen: BLOOD LEFT HAND  Result Value Ref Range Status   Specimen Description BLOOD LEFT HAND  Final   Special Requests AEROBIC BOTTLE ONLY Blood Culture adequate volume  Final   Culture   Final    NO GROWTH 5 DAYS Performed at [facility], [place] [address], [place],  [address]    Report Status 07/08/2019 FINAL  Final  SARS CORONAVIRUS 2 (TAT 6-24 HRS) Nasopharyngeal Nasopharyngeal Swab     Status: None   Collection Time: 07/03/19 10:11 PM   Specimen: Nasopharyngeal Swab  Result Value Ref Range Status   SARS Coronavirus 2 NEGATIVE NEGATIVE Final    Comment: (NOTE) SARS-CoV-2 target nucleic acids are NOT DETECTED. The SARS-CoV-2 RNA is generally detectable in upper and lower respiratory specimens during the acute phase of infection. Negative results do not preclude SARS-CoV-2 infection, do not rule out co-infections with other pathogens, and should not be used as the sole basis for treatment or other patient management decisions. Negative results must be combined with clinical observations, patient history, and epidemiological information. The expected result is Negative. Fact Sheet for Patients: SugarRoll.be Fact Sheet for Healthcare Providers: https://www.woods-mathews.com/ This test is not yet  approved or cleared by the [place] FDA and  has been authorized for detection and/or diagnosis of SARS-CoV-2 by FDA under an Emergency Use Authorization (EUA). This EUA will remain  in effect (meaning this test can be used) for the duration of the COVID-19 declaration under Section 56 4(b)(1) of the Act, 21 U.S.C. section 360bbb-3(b)(1), unless the authorization is terminated or revoked sooner. Performed at [facility], [place] [address], [place],  [address]   Respiratory Panel by PCR     Status: None   Collection Time: 07/03/19 10:11 PM   Specimen: Nasopharyngeal Swab; Respiratory  Result Value  Ref Range Status   Adenovirus NOT DETECTED NOT DETECTED Final   Coronavirus 229E NOT DETECTED NOT DETECTED Final    Comment: (NOTE) The Coronavirus on the Respiratory Panel, DOES NOT test for the novel  Coronavirus (2019 nCoV)    Coronavirus HKU1 NOT DETECTED NOT DETECTED Final   Coronavirus NL63 NOT DETECTED NOT DETECTED Final   Coronavirus OC43 NOT DETECTED NOT DETECTED Final   Metapneumovirus NOT DETECTED NOT DETECTED Final   Rhinovirus / Enterovirus NOT DETECTED NOT DETECTED Final   Influenza A NOT DETECTED NOT DETECTED Final   Influenza B NOT DETECTED NOT DETECTED Final   Parainfluenza Virus 1 NOT DETECTED NOT DETECTED Final   Parainfluenza Virus 2 NOT DETECTED NOT DETECTED Final   Parainfluenza Virus 3 NOT DETECTED NOT DETECTED Final   Parainfluenza Virus 4 NOT DETECTED NOT DETECTED Final   Respiratory Syncytial Virus NOT DETECTED NOT DETECTED Final   Bordetella pertussis NOT DETECTED NOT DETECTED Final   Chlamydophila pneumoniae NOT DETECTED NOT DETECTED Final   Mycoplasma pneumoniae NOT DETECTED NOT DETECTED Final    Comment: Performed at [facility], [place] [address] [place], [place] [address]  Respiratory Panel by RT PCR (Flu A&B, Covid) - Nasopharyngeal Swab     Status: None   Collection Time: 07/04/19  1:11 AM   Specimen: Nasopharyngeal Swab  Result  Value Ref Range Status   SARS Coronavirus 2 by RT PCR NEGATIVE NEGATIVE Final    Comment: (NOTE) SARS-CoV-2 target nucleic acids are NOT DETECTED. The SARS-CoV-2 RNA is generally detectable in upper respiratoy specimens during the acute phase of infection. The lowest concentration of SARS-CoV-2 viral copies this assay can detect is 131 copies/mL. A negative result does not preclude SARS-Cov-2 infection and should not be used as the sole basis for treatment or other patient management decisions. A negative result may occur with  improper specimen collection/handling, submission of specimen other than nasopharyngeal swab, presence of viral mutation(s) within the areas targeted by this assay, and inadequate number of viral copies (<131 copies/mL). A negative result must be combined with clinical observations, patient history, and epidemiological information. The expected result is Negative. Fact Sheet for Patients:  PinkCheek.be Fact Sheet for Healthcare Providers:  GravelBags.it This test is not yet ap proved or cleared by the [place] FDA and  has been authorized for detection and/or diagnosis of SARS-CoV-2 by FDA under an Emergency Use Authorization (EUA). This EUA will remain  in effect (meaning this test can be used) for the duration of the COVID-19 declaration under Section 564(b)(1) of the Act, 21 U.S.C. section 360bbb-3(b)(1), unless the authorization is terminated or revoked sooner.    Influenza A by PCR NEGATIVE NEGATIVE Final   Influenza B by PCR NEGATIVE NEGATIVE Final    Comment: (NOTE) The Xpert Xpress SARS-CoV-2/FLU/RSV assay is intended as an aid in  the diagnosis of influenza from Nasopharyngeal swab specimens and  should not be used as a sole basis for treatment. Nasal washings and  aspirates are unacceptable for Xpert Xpress SARS-CoV-2/FLU/RSV  testing. Fact Sheet for  Patients: PinkCheek.be Fact Sheet for Healthcare Providers: GravelBags.it This test is not yet approved or cleared by the [place] FDA and  has been authorized for detection and/or diagnosis of SARS-CoV-2 by  FDA under an Emergency Use Authorization (EUA). This EUA will remain  in effect (meaning this test can be used) for the duration of the  Covid-19 declaration under Section 564(b)(1) of the Act, 21  U.S.C. section 360bbb-3(b)(1), unless  the authorization is  terminated or revoked. Performed at [facility], [place] [address] [place], [place] [address]   MRSA PCR Screening     Status: None   Collection Time: 07/04/19  7:00 AM   Specimen: Nasopharyngeal  Result Value Ref Range Status   MRSA by PCR NEGATIVE NEGATIVE Final    Comment:        The GeneXpert MRSA Assay (FDA approved for NASAL specimens only), is one component of a comprehensive MRSA colonization surveillance program. It is not intended to diagnose MRSA infection nor to guide or monitor treatment for MRSA infections. Performed at [facility], [place] [address], [place], [place] [address]   Body fluid culture     Status: None   Collection Time: 07/04/19  1:23 PM   Specimen: Pleura; Body Fluid  Result Value Ref Range Status   Specimen Description PLEURAL RIGHT  Final   Special Requests NONE  Final   Gram Stain   Final    RARE WBC PRESENT, PREDOMINANTLY MONONUCLEAR NO ORGANISMS SEEN    Culture   Final    NO GROWTH Performed at [facility], [address], [place], [place] [address]    Report Status 07/07/2019 FINAL  Final  Body fluid culture (includes gram stain)     Status: None   Collection Time: 07/04/19  1:23 PM   Specimen: Pleural Fluid  Result Value Ref Range Status   Specimen Description PLEURAL LEFT  Final   Special Requests NONE  Final   Gram Stain   Final    RARE WBC PRESENT, PREDOMINANTLY MONONUCLEAR NO ORGANISMS  SEEN    Culture   Final    NO GROWTH Performed at [facility], [place] [address], [place], [place] [address]    Report Status 07/07/2019 FINAL  Final  Surgical pcr screen     Status: None   Collection Time: 07/12/19  3:31 PM   Specimen: Nasal Mucosa; Nasal Swab  Result Value Ref Range Status   MRSA, PCR NEGATIVE NEGATIVE Final   Staphylococcus aureus NEGATIVE NEGATIVE Final    Comment: (NOTE) The Xpert SA Assay (FDA approved for NASAL specimens in patients 63 years of age and older), is one component of a comprehensive surveillance program. It is not intended to diagnose infection nor to guide or monitor treatment. Performed at [facility], [place] [address] [place], [place] [address]   Fungus Culture With Stain     Status: None (Preliminary result)   Collection Time: 07/12/19  5:45 PM   Specimen: PATH Cytology Misc. fluid; Body Fluid  Result Value Ref Range Status   Fungus Stain Final report  Final    Comment: (NOTE) Performed At: [facility] [place], [place] [id number] Rush Farmer MD [id number]    Fungus (Mycology) Culture PENDING  Incomplete   Fungal Source FLUID  Final    Comment: PERICARDIAL Performed at [facility], [place] [address] [place], [place] [address]   Aerobic/Anaerobic Culture (surgical/deep wound)     Status: None   Collection Time: 07/12/19  5:45 PM   Specimen: PATH Cytology Misc. fluid; Body Fluid  Result Value Ref Range Status   Specimen Description FLUID PERICARDIAL  Final   Special Requests NONE  Final   Gram Stain NO WBC SEEN NO ORGANISMS SEEN   Final   Culture   Final    No growth aerobically or anaerobically. Performed at [facility], [address] [place], [place] [address]    Report Status 07/17/2019 FINAL  Final  Acid Fast Smear (AFB)     Status: None   Collection Time: 07/12/19  5:45 PM   Specimen: PATH Cytology Misc. fluid; Body Fluid  Result Value Ref Range Status   AFB Specimen  Processing Concentration  Final   Acid Fast Smear Negative  Final    Comment: (NOTE) Performed At: [facility] [place], [place] [id number] Rush Farmer MD [id number]    Source (AFB) FLUID  Final    Comment: PERICARDIAL Performed at [facility], [place] [address] [place], [place] [address]   Fungus Culture Result     Status: None   Collection Time: 07/12/19  5:45 PM  Result Value Ref Range Status   Result 1 Comment  Final    Comment: (NOTE) KOH/Calcofluor preparation:  no fungus observed. Performed At: [facility] [place], [place] [id number] Rush Farmer MD [id number]   Culture, group A strep     Status: None   Collection Time: 07/26/19 12:21 PM   Specimen: Throat  Result Value Ref Range Status   Specimen Description THROAT  Final   Special Requests NONE  Final   Culture   Final    NO GROUP A STREP (S.PYOGENES) ISOLATED Performed at [facility], [place] [address], [place], [place] [address]    Report Status 07/28/2019 FINAL  Final    Coagulation Studies: No results for input(s): LABPROT, INR in the last 72 hours.  Urinalysis: No results for input(s): COLORURINE, LABSPEC, PHURINE, GLUCOSEU, HGBUR, BILIRUBINUR, KETONESUR, PROTEINUR, UROBILINOGEN, NITRITE, LEUKOCYTESUR in the last 72 hours.  Invalid input(s): APPERANCEUR    Imaging: No results found.   Medications:   . sodium chloride    . sodium chloride    . sodium chloride    . sodium chloride    . sodium chloride    . feeding supplement (NEPRO CARB STEADY) 1,000 mL (08/15/19 0610)  . ondansetron [facility]) IV 8 mg (08/14/19 0541)   . acetaminophen  500 mg Oral BID  . bisacodyl  10 mg Rectal Daily  . Chlorhexidine Gluconate Cloth  6 each Topical Q0600  . collagenase   Topical BID  . [START ON 08/19/2019] darbepoetin (ARANESP) injection - NON-DIALYSIS  100 mcg Subcutaneous Q Sat-1800  . diclofenac Sodium  2 g Topical BID  . famotidine  10 mg  Oral Daily  . feeding supplement (ENSURE ENLIVE)  237 mL Oral BID BM  . feeding supplement (PRO-STAT SUGAR FREE 64)  30 mL Per Tube Daily  . gabapentin  100 mg Per Tube Q12H  . Gerhardt's butt cream   Topical TID  . heparin  5,000 Units Subcutaneous Q8H  . loratadine  10 mg Oral Daily  . mouth rinse  15 mL Mouth Rinse BID  . metoprolol tartrate  25 mg Oral BID  . neomycin-bacitracin-polymyxin   Topical BID  . nystatin  5 mL Mouth/Throat QID  . pantoprazole sodium  40 mg Per Tube BID  . predniSONE  60 mg Oral Q breakfast  . sertraline  25 mg Oral BID  . sodium chloride flush  3 mL Intravenous Q12H  . sodium chloride irrigation  15 mL Irrigation QID  . thiamine  100 mg Oral Daily   sodium chloride, sodium chloride, sodium chloride, sodium chloride, sodium chloride, acetaminophen (TYLENOL) oral liquid 160 mg/5 mL, ALPRAZolam, alteplase, alum & mag hydroxide-simeth, antiseptic oral rinse, bisacodyl, bisacodyl,  camphor-menthol, fentaNYL (SUBLIMAZE) injection, heparin, hydrOXYzine, ondansetron (ZOFRAN) IV, oxyCODONE, polyethylene glycol, polyvinyl alcohol, senna-docusate, sodium chloride flush, witch hazel-glycerin  Assessment/ Plan:   Acute kidney injury with a creatinine of 1.43 07/03/2019 thought to be hemodynamically mediated possible hepatorenal syndrome with concomitant use of IV contrast urinalysis was unremarkable autoimmune work-up has been unremarkable however there is some consideration of scleroderma.  She was initiated on dialysis due to progressive loss of renal function and she is anuric this was started 07/18/2019.  She no longer has a slot at [facility] for dialysis.  Anemia ESA was held 08/11/2019  Malnutrition low albumin  Moderate to large pericardial effusion status post pericardial window 08/11/2018  Liver disease diagnosed in [facility] with history of steatosis.  Scleroderma with crest syndrome.  Paroxysmal atrial fibrillation   appreciate assistance from Dr. Davina Poke.  No  anticoagulation for now due to high risk of bleeding recent pericardial effusion and window.  Patient awaiting transfer to [facility] to [place].  Palliative care following   LOS: [place] @TODAY @7 :38 AM

## 2019-08-15 NOTE — Progress Notes (Signed)
Nutrition Follow-up  RD working remotely.  DOCUMENTATION CODES:   Severe malnutrition in context of chronic illness, Underweight  INTERVENTION:   Continue tube feeding viaG-tube: - Nepro @ 45 ml/hr (1039m/day) - Pro-stat 30 ml daily - Free water per MD  Tube feeding regimen provides2044kcal, 102grams of protein, and 7836mof H2O, meets 100% of needs.  - Encourage adequate PO intake and provide feeding assistance as needed  - d/c Ensure Enlive due to pt refusal of 100% of doses  NUTRITION DIAGNOSIS:   Severe Malnutrition related to chronic illness (pericardial effusion w/ volume overload) as evidenced by moderate fat depletion, severe muscle depletion, percent weight loss.  Ongoing, being addressed via TF  GOAL:   Patient will meet greater than or equal to 90% of their needs  Met via TF  MONITOR:   PO intake, Supplement acceptance, Labs, TF tolerance, Weight trends, I & O's  REASON FOR ASSESSMENT:   Consult Assessment of nutrition requirement/status  ASSESSMENT:   Patient with PMH significant for chronic pericardial effusion, moderate AVR, HTN, hepatic steatosis, and peripheral neuropathy. Presents this admission with anasarca and bilateral pleural effusions.  12/15 - s/p bilateral thoracentesiswith700 ml fluid removed 12/17 - s/p cardiac cath 12/23 - s/p VATS, pericardial window 12/30 - Cortrak placed, tip in stomach 01/07 - TF switched to Nepro due to persistent hyperkalemia, Cortrak removed 01/08 - s/p G-tube placement 01/09 - steroids initiated 01/12 - s/p bilateral thoracentesis with 1600 ml total fluid removed  Pt completed high-dose IV steroids. Per rheumatology, pt to start on prednisone 60 mg daily for a week. 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  Per Nephrology note, [facility] Pt continues on a Regular diet with minimal PO intake.  [facility] 100% [facility]live [facility] will d/c order.  Pt in HD today. Last HD was on 1/24 with 1254 ml net UF. Post-HD weight was 40.3 kg.  Weight stable over the last week.  Current TF: Nepro @ 45 ml/hr, Pro-stat 30 ml daily  Meal Completion: 0-10% x last 8 recorded meals (several meals missing)  Medications reviewed and include: dulcolax, Ensure Enlive BID, pepcid, nystatin, protonix, prednisone, thiamine  Labs reviewed: potassium 5.2, chloride 93, phosphorus 4.9 CBG's: 94-123 x 24 hours  Diet Order:   Diet Order            Diet regular Room service appropriate? Yes; Fluid consistency: Thin; Fluid restriction: 1500 mL Fluid  Diet effective now              EDUCATION NEEDS:   Education needs have been addressed  Skin:  Skin Assessment: Skin Integrity Issues: Stage II: sacrum, bilateral nares Incisions: chest, left neck Other: cracking/dryness to BLE  Last BM:  08/13/19  Height:   Ht Readings from Last 1 Encounters:  07/04/19 _0  (1.676 m)    Weight:   Wt Readings from Last 1 Encounters:  08/15/19 42.2 kg    Ideal Body Weight:  59.1 kg  BMI:  Body mass index is 15.01 kg/m.  Estimated Nutritional Needs:   Kcal:  1800-2000  Protein:  90-105 grams  Fluid:  1000 ml + UOP    KaGaynell FaceMS, RD, LDN Inpatient Clinical Dietitian Pager: [phone]/After Hours: [phone]

## 2019-08-15 NOTE — Procedures (Signed)
Patient seen on dialysis tolerating treatment well.  Blood pressure 130/80 pulse 74 and regular.  Blood flow rate 400.  Left IJ Dialysis catheter

## 2019-08-15 NOTE — Progress Notes (Signed)
Cardiology Progress Note  Patient ID: Joanna Ali MRN: [id number] DOB: Oct 14, 1950 Date of Encounter: 08/15/2019  Primary Cardiologist: Evalina Field, MD  Subjective  Extremely fatigued after hemodialysis.  Appears to have worsening Raynaud's as identified by hospital medicine.  We have transitioned her to diltiazem.  No complaints.  ROS:  All other ROS reviewed and negative. Pertinent positives noted in the HPI.     Inpatient Medications  Scheduled Meds: . acetaminophen  500 mg Oral BID  . bisacodyl  10 mg Rectal Daily  . Chlorhexidine Gluconate Cloth  6 each Topical Q0600  . collagenase   Topical BID  . [START ON 08/19/2019] darbepoetin (ARANESP) injection - DIALYSIS  100 mcg Intravenous Q Sat-HD  . diclofenac Sodium  2 g Topical BID  . diltiazem  120 mg Oral Daily  . famotidine  10 mg Oral Daily  . feeding supplement (PRO-STAT SUGAR FREE 64)  30 mL Per Tube Daily  . gabapentin  100 mg Per Tube Q12H  . Gerhardt's butt cream   Topical TID  . heparin      . heparin  5,000 Units Subcutaneous Q8H  . loratadine  10 mg Oral Daily  . mouth rinse  15 mL Mouth Rinse BID  . multivitamin  1 tablet Oral QHS  . neomycin-bacitracin-polymyxin   Topical BID  . nystatin  5 mL Mouth/Throat QID  . pantoprazole sodium  40 mg Per Tube BID  . [START ON 08/16/2019] predniSONE  60 mg Oral Q breakfast   Followed by  . [START ON 08/22/2019] predniSONE  50 mg Oral Q breakfast   Followed by  . [START ON 08/24/2019] predniSONE  40 mg Oral Q breakfast   Followed by  . [START ON 08/21/2019] predniSONE  30 mg Oral Q breakfast  . sertraline  25 mg Oral BID  . sodium chloride flush  3 mL Intravenous Q12H  . sodium chloride irrigation  15 mL Irrigation QID  . thiamine  100 mg Oral Daily   Continuous Infusions: . sodium chloride    . sodium chloride    . sodium chloride    . sodium chloride    . sodium chloride    . feeding supplement (NEPRO CARB STEADY) 1,000 mL (08/15/19 0610)  . ondansetron [facility])  IV 8 mg (08/15/19 1217)   PRN Meds: sodium chloride, sodium chloride, sodium chloride, sodium chloride, sodium chloride, acetaminophen (TYLENOL) oral liquid 160 mg/5 mL, ALPRAZolam, alteplase, alum & mag hydroxide-simeth, antiseptic oral rinse, bisacodyl, bisacodyl, camphor-menthol, fentaNYL (SUBLIMAZE) injection, heparin, hydrOXYzine, ondansetron (ZOFRAN) IV, oxyCODONE, polyethylene glycol, polyvinyl alcohol, senna-docusate, sodium chloride flush, witch hazel-glycerin   Vital Signs   Vitals:   08/15/19 1030 08/15/19 1100 08/15/19 1112 08/15/19 1219  BP: 96/73 (!) 112/59 (!) 145/92 133/89  Pulse: 70 (!) 101 (!) 103   Resp:   18   Temp:   97.6 F (36.4 C)   TempSrc:   Oral   SpO2:   100%   Weight:   40.8 kg   Height:        Intake/Output Summary (Last 24 hours) at 08/15/2019 1327 Last data filed at 08/15/2019 1112 Gross per 24 hour  Intake 3 ml  Output 1900 ml  Net -1897 ml   Last 3 Weights 08/15/2019 08/15/2019 08/15/2019  Weight (lbs) 90 lb 93 lb 83 lb  Weight (kg) 40.824 kg 42.185 kg 37.649 kg      Telemetry  Overnight telemetry shows normal sinus rhythm with frequent PACs, Ashman complexes noted, which  I personally reviewed.   ECG  The most recent ECG shows normal sinus rhythm, heart rate 89, EKG dated 08/09/2019, which I personally reviewed.   Physical Exam   Vitals:   08/15/19 1030 08/15/19 1100 08/15/19 1112 08/15/19 1219  BP: 96/73 (!) 112/59 (!) 145/92 133/89  Pulse: 70 (!) 101 (!) 103   Resp:   18   Temp:   97.6 F (36.4 C)   TempSrc:   Oral   SpO2:   100%   Weight:   40.8 kg   Height:         Intake/Output Summary (Last 24 hours) at 08/15/2019 1327 Last data filed at 08/15/2019 1112 Gross per 24 hour  Intake 3 ml  Output 1900 ml  Net -1897 ml    Last 3 Weights 08/15/2019 08/15/2019 08/15/2019  Weight (lbs) 90 lb 93 lb 83 lb  Weight (kg) 40.824 kg 42.185 kg 37.649 kg    Body mass index is 14.53 kg/m.  General: Frail, ill-appearing Head: Atraumatic,  normal size  Eyes: PEERLA, EOMI  Neck: Supple, no JVD Endocrine: No thryomegaly Cardiac: Normal S1, S2; RRR; no murmurs, rubs, or gallops Lungs: Diminished breath sounds bilaterally at the lung bases Abd: Soft, nontender, no hepatomegaly  Ext: Upper extremity digits are noticeably blue Musculoskeletal: No deformities, BUE and BLE strength normal and equal Skin: Warm and dry, no rashes   Neuro: Alert and oriented to person, place, time, and situation, CNII-XII grossly intact, no focal deficits  Psych: Normal mood and affect   Labs  High Sensitivity Troponin:  No results for input(s): TROPONINIHS in the last 720 hours.   Cardiac EnzymesNo results for input(s): TROPONINI in the last 168 hours. No results for input(s): TROPIPOC in the last 168 hours.  Chemistry Recent Labs  Lab 08/13/19 0734 08/14/19 0603 08/15/19 0251  NA 133* 137 135  K 5.6* 4.4 5.2*  CL 92* 95* 93*  CO2 21* 22 22  GLUCOSE 130* 45* 89  BUN 153* 82* 128*  CREATININE 3.78* 2.49* 3.50*  CALCIUM 9.7 9.2 9.5  ALBUMIN 3.2* 3.1* 3.0*  GFRNONAA 11* 19* 13*  GFRAA 13* 22* 15*  ANIONGAP 20* 20* 20*    Hematology Recent Labs  Lab 08/11/19 0234 08/12/19 0441 08/15/19 0251  WBC 1.6* 4.2 8.2  RBC 4.11 4.52 4.67  HGB 11.5* 12.4 12.9  HCT 39.8 42.9 43.9  MCV 96.8 94.9 94.0  MCH 28.0 27.4 27.6  MCHC 28.9* 28.9* 29.4*  RDW 20.1* 20.8* 20.6*  PLT 234 252 174   BNPNo results for input(s): BNP, PROBNP in the last 168 hours.  DDimer No results for input(s): DDIMER in the last 168 hours.   Radiology  No results found.  Cardiac Studies  TTE 07/17/2019   1. Left ventricular ejection fraction, by visual estimation, is 50 to 55%. The left ventricle has low normal function. There is mildly increased left ventricular hypertrophy.  2. Moderate hypokinesis of the left ventricular, entire septal wall.  3. The left ventricle demonstrates regional wall motion abnormalities.  4. Global right ventricle has normal systolic  function.The right ventricular size is normal. No increase in right ventricular wall thickness.  5. Left atrial size was normal.  6. Right atrial size was mildly dilated.  7. Moderate pleural effusion in the left lateral region.  8. The pericardial effusion is circumferential.  9. Trivial pericardial effusion is present. 10. The mitral valve is abnormal. Mild mitral valve regurgitation. 11. The tricuspid valve is grossly normal. Tricuspid valve  regurgitation moderate. 12. Aortic valve regurgitation is moderate. 13. The aortic valve is tricuspid. Aortic valve regurgitation is moderate. Mild aortic valve sclerosis without stenosis. 14. The pulmonic valve was grossly normal. Pulmonic valve regurgitation is not visualized. 15. Moderately elevated pulmonary artery systolic pressure. 16. The inferior vena cava is dilated in size with >50% respiratory variability, suggesting right atrial pressure of 8 mmHg.  Patient Profile   69 y.o. female with PMH of HTN, OA, pericardial effusion, anasarca 2/2 liver disease, and moderate AI who was sent to the hospital by Dr. Audie Box on 07/03/2019 for worsening dyspnea and atrial tachycardia with HR 130s. CTA negative PE, but showed moderate pericardial effusion and airspace disease concerning for infection with superimposed edema. Bedside echo demonstrated large pericardial effusion. She underwent IV diuresis, renal function subsequently worsened. PYP scan negative for amyloid. Right heart cath performed on 12/17 showed mild PAH, CO 5.0, CI 3.2. 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 Echo 12/20 showed EF 45-50%, no temponade, moderate to severe AI, enlarging pericardial effusion. She underwent pericardial window by Dr. Wyvonnia Lora 07/12/2019. Repeat echo 12/28 EF 50-55%, mid MR, moderate AI. Worsening renal function (Cr 0.67 on arrival, 5.1 by 12/29) resulted in initiation of dialysis on 12/29. Tunneled HD  catheter placed 12/29. G tube placed. Thoracentesis performed on 1/12 due to worsening dyspnea, 1100 ml light amber pleural fluid removed. Started on steroid by palliative care to stimulate appetite and reduce inflammatory pain and possibly undiagnosed rheumatological disorder. She had an episode of unresponsiveness on 1/14 after dialysis and second dose of ativan, this quickly resolved and felt to be overmedication.  Course has been complicated by paroxysmal atrial fibrillation.  She is now been anticoagulated due to perceived high bleeding risk.  Assessment & Plan   1.  Paroxysmal atrial fibrillation -Remains in normal sinus rhythm.  Just has sinus rhythm with frequent PACs.   -Due to worsening Raynaud's we will transition to diltiazem extended release 120 mg daily. -She really has had paroxysmal episodes and I have not seen any recurrence has had no service.  We will hold anticoagulation this time.  She is high bleeding risk due to ongoing frailty as well as ESRD.  2.  Anasarca/pericardial effusion -Extensive work-up here.  Her right and left heart catheterization were consistent with tamponade.  Her PYP scan was negative for amyloid.  Her most recent echo shows low normal ejection fraction 50 to 55%.  She has moderate aortic insufficiency.  She is status post pericardial window. -Really unclear etiology of her anasarca and volume overload.  It has been attributed to combination of liver disease and severe hypoalbuminemia.  Her initial rheumatologic work-up in September was negative.  Here the only work-up that is positive is concerns for possible rheumatoid arthritis versus third hour.  She is a positive anti-RNA-polymerase 3 antibody.  Hematology is concerned for some underlying malignancy that is possibly causing scleroderma secondary picture.  She is on empiric steroids.   For now, will defer management to hospital medicine team.    3.  AKI now on hemodialysis -Volume is managed by nephrology.   She is set up for outpatient hemodialysis.  4.  Moderate aortic insufficiency -No need for intervention at this time  For questions or updates, please contact [facility] Please consult [email or web address] for contact info under   Signed, [place] T. Audie Box, [place]  08/15/2019 1:27 PM

## 2019-08-15 NOTE — Plan of Care (Signed)

## 2019-08-15 NOTE — Consult Note (Addendum)
Hospital Consult  VASCULAR SURGERY ASSESSMENT & PLAN:   ISCHEMIC FINGERS: The patient has ischemic fingers bilaterally as documented in the photographs below.  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  She has palpable radial pulses bilaterally and biphasic radial and ulnar signals with the Doppler bilaterally.  She has no ischemic toes.  She has palpable dorsalis pedis pulses in both feet and brisk Doppler signals in the dorsalis pedis and posterior tibial positions bilaterally.  Her lower extremity noninvasive studies were unremarkable.  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  No further vascular work-up is indicated.  She is on subcu heparin.  From a vascular standpoint there is no indication for additional anticoagulation.  Vascular surgery will be available as needed.   ACUTE RENAL FAILURE: She has a functioning left IJ tunneled dialysis catheter.      Joanna Mayo, MD Office: [phone]   Reason for Consult:  Cold hands, bilateral cyanosis Requesting Physician:  Joanna Chiquito, MD MRN #:  [id number]  History of Present Illness: This is a 69 y.o. 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  In addition, paraneoplastic syndrome was considered; however, she has had extensive work up including multiple imaging exams and gastric, small bowel, and liver biopsy all negative for malignancy.  She is interviewed and examed in the HD unit where she is finishing up treatment without apparent complications.  She is restless stating she "needs to get up" and "needs to throw up".  She denies pain.  Underwent ABIs and duplex arterial  studies of lower extremities in September, 2020 due to "blue fingers and toes and edema". Normal ABIs  The pt not on a statin for cholesterol management.  The pt not on a daily aspirin.   Other AC:  heparin [place] for DVT prophy The pt  on Cardizem for a-fib/RVR  (changed today from beta blocker due to worsening Raynaud's phenomenon of bilateral fingers The pt not diabetic.   Tobacco hx:  5 year pack history, [id number]  Past Medical History:  Diagnosis Date  . Hypertension   . Leg swelling 03/2019  . Short of breath on exertion 03/2019    Past Surgical History:  Procedure Laterality Date  . APPENDECTOMY    . IR FLUORO GUIDE CV LINE LEFT  07/18/2019  . IR GASTROSTOMY TUBE MOD SED  07/28/2019  . IR US GUIDE VASC ACCESS LEFT  07/18/2019  . NECK SURGERY    . RIGHT AND LEFT HEART CATH N/A 07/06/2019   Procedure: RIGHT AND LEFT HEART CATH;  Surgeon: Jolaine Artist, MD;  Location: [facility];  Service: Cardiovascular;  Laterality: N/A;  . TEE WITHOUT CARDIOVERSION N/A 03/28/2019   Procedure: TRANSESOPHAGEAL ECHOCARDIOGRAM (TEE);  Surgeon: Geralynn Rile, MD;  Location: [facility];  Service: Cardiology;  Laterality: N/A;  . VIDEO ASSISTED THORACOSCOPY Right 07/12/2019   Procedure: VIDEO ASSISTED THORACOSCOPY Pericardial window ;  Surgeon: Lajuana Matte, MD;  Location: [facility];  Service: Thoracic;  Laterality: Right;    No Known Allergies  Prior to Admission medications   Medication Sig Start Date End Date Taking? Authorizing Provider  gabapentin (NEURONTIN) 300 MG capsule Take 300 mg by mouth 3 (three) times daily.  05/24/19  07/04/19 Yes [provider]  Multiple Vitamins-Minerals (MULTIVITAMIN WITH MINERALS) tablet Take 1 tablet by mouth daily.   Yes [provider]  Multiple Vitamins-Minerals (PRESERVISION AREDS 2) CAPS Take 1 capsule by mouth 2 (two) times daily.    Yes [provider]  Propylene Glycol (SYSTANE BALANCE) 0.6 % SOLN Place 1  drop into both eyes 2 (two) times daily as needed (dry eyes).   Yes [provider]  senna (SENOKOT) 8.6 MG TABS tablet Take 1-3 tablets by mouth See admin instructions. Taking 1 tablet in the am and 2 tabs at bedtime   Yes [provider]  sertraline (ZOLOFT) 25 MG tablet Take 25 mg by mouth 2 (two) times daily.  05/17/19 05/16/20 Yes [provider]  spironolactone (ALDACTONE) 100 MG tablet Take 100 mg by mouth daily.  04/19/19 05/22/20 Yes [provider]  torsemide (DEMADEX) 20 MG tablet Take 20 mg by mouth daily.  04/18/19 05/22/20 Yes [provider]  traMADol (ULTRAM) 50 MG tablet Take 50 mg by mouth every 6 (six) hours as needed for moderate pain.    Yes [provider]  TURMERIC PO Take 1 capsule by mouth daily.    Yes [provider]    Social History   Socioeconomic History  . Marital status: Married    Spouse name: Not on file  . Number of children: Not on file  . Years of education: Not on file  . Highest education level: Not on file  Occupational History  . Not on file  Tobacco Use  . Smoking status: Former Smoker    Packs/day: 0.50    Years: 10.00    Pack years: 5.00    Types: Cigarettes    Quit date: 03/20/1979    Years since quitting: 40.4  . Smokeless tobacco: Never Used  . Tobacco comment: pt states she smoked from her early 65s to early 21s.  Substance and Sexual Activity  . Alcohol use: No  . Drug use: No  . Sexual activity: Not on file  Other Topics Concern  . Not on file  Social History Narrative  . Not on file   Social Determinants of Health   Financial Resource Strain:   . Difficulty of Paying Living Expenses: Not on file  Food Insecurity:   . Worried About Charity fundraiser in the Last Year: Not on file  . Ran Out of Food in the Last Year: Not on file  Transportation Needs:   . Lack of Transportation (Medical): Not on file  . Lack of Transportation (Non-Medical): Not on file  Physical  Activity:   . Days of Exercise per Week: Not on file  . Minutes of Exercise per Session: Not on file  Stress:   . Feeling of Stress : Not on file  Social Connections:   . Frequency of Communication with Friends and Family: Not on file  . Frequency of Social Gatherings with Friends and Family: Not on file  . Attends Religious Services: Not on file  . Active Member of Clubs or Organizations: Not on file  . Attends Archivist Meetings: Not on file  . Marital Status: Not on file  Intimate Partner Violence:   . Fear of Current or Ex-Partner: Not on file  . Emotionally Abused: Not on file  . Physically Abused: Not on file  . Sexually Abused: Not on file     Family History  Problem Relation Age of Onset  . Hypertension Mother  ROS: [x]  Positive   [ ]  Negative   [ ]  All sytems reviewed and are negative  Cardiac: []  chest pain/pressure []  palpitations []  SOB lying flat []  DOE  Vascular: []  pain in legs while walking []  pain in legs at rest []  pain in legs at night []  non-healing ulcers []  hx of DVT []  swelling in legs  Pulmonary: []  productive cough []  asthma/wheezing []  home O2  Neurologic: []  weakness in []  arms []  legs []  numbness in []  arms []  legs []  hx of CVA []  mini stroke [] difficulty speaking or slurred speech []  temporary loss of vision in one eye []  dizziness  Hematologic: []  hx of cancer []  bleeding problems []  problems with blood clotting easily  Endocrine:   []  diabetes []  thyroid disease  GI []  vomiting blood []  blood in stool  GU: [X]  CKD/renal failure []  HD--[]  M/W/F or []  T/T/S []  burning with urination []  blood in urine  Psychiatric: []  anxiety []  depression  Musculoskeletal: []  arthritis []  joint pain  Integumentary: []  rashes []  ulcers  Constitutional: []  fever []  chills   Physical Examination  Vitals:   08/15/19 1030 08/15/19 1100  BP: 96/73 (!) 112/59  Pulse: 70 (!) 101  Resp:    Temp:    SpO2:       Body mass index is 15.01 kg/m.  General:  Cachectic, chronically ill appearing Gait: Not observed HENT: WNL, normocephalic Pulmonary: normal non-labored breathing, without Rales, rhonchi,  wheezing Cardiac: regular, without  Murmurs, rubs or gallops; without carotid bruits Abdomen:  soft, NT/ND, no masses Skin: without rashes Vascular Exam/Pulses:  Right Left  Radial 2+ (normal) 2+ (normal)  Ulnar absent absent  Brachial 2+ 2+ (normal)  Femoral 2+ (normal) 2+ (normal)  Popliteal 2+ (normal) 2+ (normal)  DP 2+ (normal) 2+ (normal)  PT 2+ (normal) absent       Extremities: with ischemic changes, without Gangrene , without cellulitis; without open wounds;  Hands are cool to touch to the wrists bilaterally. 5/5 grip strength. Bluish discoloration of all 10 fingers Musculoskeletal: no muscle wasting or atrophy  Neurologic: A&O X 3;  No focal weakness or paresthesias are detected; speech is fluent/normal Psychiatric:  The pt has Abnormal- agitated affect.  Physical therapist notes dated 1/25: Ambulation/Gait Ambulation/Gait assistance: Min assist;+2 safety/equipment(chair follow) Gait Distance (Feet): 10 Feet Assistive device: Rolling walker (2 wheeled) Gait Pattern/deviations: Trunk flexed;Step-through pattern;Decreased stride length Gait velocity: decreased General Gait Details: cues for maintaining safe proximity to RW and upright posture/forward gaze; limited by fatigue so chair follow close for safety  CBC    Component Value Date/Time   WBC 8.2 08/15/2019 0251   RBC 4.67 08/15/2019 0251   HGB 12.9 08/15/2019 0251   HGB 11.2 03/23/2019 1145   HCT 43.9 08/15/2019 0251   HCT 33.3 (L) 03/23/2019 1145   PLT 174 08/15/2019 0251   PLT 243 03/23/2019 1145   MCV 94.0 08/15/2019 0251   MCV 93 03/23/2019 1145   MCH 27.6 08/15/2019 0251   MCHC 29.4 (L) 08/15/2019 0251   RDW 20.6 (H) 08/15/2019 0251   RDW 11.9 03/23/2019 1145   LYMPHSABS 0.4 (L) 08/10/2019 0626   LYMPHSABS  1.2 03/23/2019 1145   MONOABS 0.3 08/10/2019 0626   EOSABS 0.2 08/10/2019 0626   EOSABS 0.2 03/23/2019 1145   BASOSABS 0.0 08/10/2019 0626   BASOSABS 0.0 03/23/2019 1145    BMET    Component Value Date/Time   NA 135 08/15/2019 0251   NA 134 03/23/2019  1150   K 5.2 (H) 08/15/2019 0251   CL 93 (L) 08/15/2019 0251   CO2 22 08/15/2019 0251   GLUCOSE 89 08/15/2019 0251   BUN 128 (H) 08/15/2019 0251   BUN 23 03/23/2019 1150   CREATININE 3.50 (H) 08/15/2019 0251   CALCIUM 9.5 08/15/2019 0251   GFRNONAA 13 (L) 08/15/2019 0251   GFRAA 15 (L) 08/15/2019 0251    COAGS: Lab Results  Component Value Date   INR 1.1 07/27/2019   INR 1.2 07/12/2019   INR 1.2 07/03/2019     Non-Invasive Vascular Imaging:   ABI Findings: +---------+------------------+-----+---------+--------+ Right    Rt Pressure (mmHg)IndexWaveform Comment  +---------+------------------+-----+---------+--------+ Brachial 103                                      +---------+------------------+-----+---------+--------+ ATA      109               1.06 triphasic         +---------+------------------+-----+---------+--------+ PTA      139               1.35 triphasic         +---------+------------------+-----+---------+--------+ PERO     127               1.23 biphasic          +---------+------------------+-----+---------+--------+ Great Toe80                0.78                   +---------+------------------+-----+---------+--------+  +---------+------------------+-----+---------+-------+ Left     Lt Pressure (mmHg)IndexWaveform Comment +---------+------------------+-----+---------+-------+ Brachial 96                                      +---------+------------------+-----+---------+-------+ ATA      122               1.18 triphasic        +---------+------------------+-----+---------+-------+ PTA      133               1.29 triphasic         +---------+------------------+-----+---------+-------+ PERO     121               1.17 biphasic         +---------+------------------+-----+---------+-------+ Great Toe59                0.57                  +---------+------------------+-----+---------+-------+  +-------+-----------+-----------+------------+------------+ ABI/TBIToday's ABIToday's TBIPrevious ABIPrevious TBI +-------+-----------+-----------+------------+------------+ Right  1.3        0.78                                +-------+-----------+-----------+------------+------------+ Left   1.29       0.57                                +-------+-----------+-----------+------------+------------+  OES Findings:   Digital PPG tracings are somewhat diminished with borderline pulsatile flow. Summary: Right: Resting right ankle-brachial index is within normal range. No evidence of significant right lower extremity arterial disease. The right toe-brachial  index is normal.  Left: Resting left ankle-brachial index is within normal range. No evidence of significant left lower extremity arterial disease. The left toe-brachial index is abnormal.  Procedure: Limited Echo, Cardiac Doppler and Limited Color Doppler  Indications:     I31.3 Pericardial effusion   History:         Patient has prior history of Echocardiogram examinations, most                  recent 07/09/2019. Signs/Symptoms:Dyspnea; Risk                  Factors:Hypertension.   Sonographer:     Tiffany Dance Referring Phys:  [id number] Rogers Diagnosing Phys: Lyman Bishop MD  IMPRESSIONS    1. Left ventricular ejection fraction, by visual estimation, is 50 to 55%. The left ventricle has low normal function. There is mildly increased left ventricular hypertrophy.  2. Moderate hypokinesis of the left ventricular, entire septal wall.  3. The left ventricle demonstrates regional wall motion abnormalities.  4.  Global right ventricle has normal systolic function.The right ventricular size is normal. No increase in right ventricular wall thickness.  5. Left atrial size was normal.  6. Right atrial size was mildly dilated.  7. Moderate pleural effusion in the left lateral region.  8. The pericardial effusion is circumferential.  9. Trivial pericardial effusion is present. 10. The mitral valve is abnormal. Mild mitral valve regurgitation. 11. The tricuspid valve is grossly normal. Tricuspid valve regurgitation moderate. 12. Aortic valve regurgitation is moderate. 13. The aortic valve is tricuspid. Aortic valve regurgitation is moderate. Mild aortic valve sclerosis without stenosis. 14. The pulmonic valve was grossly normal. Pulmonic valve regurgitation is not visualized. 15. Moderately elevated pulmonary artery systolic pressure. 16. The inferior vena cava is dilated in size with >50% respiratory variability, suggesting right atrial pressure of 8 mmHg.  FINDINGS  Left Ventricle: Left ventricular ejection fraction, by visual estimation, is 50 to 55%. The left ventricle has low normal function. Moderate hypokinesis of the left ventricular, entire septal wall. The left ventricle demonstrates regional wall motion  abnormalities. There is mildly increased left ventricular hypertrophy.  Right Ventricle: The right ventricular size is normal. No increase in right ventricular wall thickness. Global RV systolic function is has normal systolic function. The tricuspid regurgitant velocity is 2.91 m/s, and with an assumed right atrial pressure  of 8 mmHg, the estimated right ventricular systolic pressure is moderately elevated at 42.0 mmHg.  Left Atrium: Left atrial size was normal in size.  Right Atrium: Right atrial size was mildly dilated  Pericardium: Trivial pericardial effusion is present. The pericardial effusion is circumferential. There is a moderate pleural effusion in the left lateral  region.  Mitral Valve: The mitral valve is abnormal. There is mild thickening of the mitral valve leaflet(s). Mild mitral valve regurgitation.  Tricuspid Valve: The tricuspid valve is grossly normal. Tricuspid valve regurgitation moderate.  Aortic Valve: The aortic valve is tricuspid. Aortic valve regurgitation is moderate. Aortic regurgitation PHT measures 480 msec. Mild aortic valve sclerosis is present, with no evidence of aortic valve stenosis.  Pulmonic Valve: The pulmonic valve was grossly normal. Pulmonic valve regurgitation is not visualized. Pulmonic regurgitation is not visualized.  Aorta: The aortic root and ascending aorta are structurally normal, with no evidence of dilitation.  Venous: The inferior vena cava is dilated in size with greater than 50% respiratory variability, suggesting right atrial pressure of 8 mmHg.  IAS/Shunts: No atrial level shunt  detected by color flow Doppler.    LEFT VENTRICLE PLAX 2D LVIDd:         4.70 cm LVIDs:         3.20 cm LV PW:         1.20 cm LV IVS:        0.80 cm LVOT diam:     1.90 cm LV SV:         61 ml LV SV Index:   37.99 LVOT Area:     2.84 cm    RIGHT VENTRICLE          IVC RV Basal diam:  3.30 cm  IVC diam: 2.10 cm RV Mid diam:    1.50 cm  LEFT ATRIUM             Index       RIGHT ATRIUM           Index LA diam:        4.10 cm 2.51 cm/m  RA Area:     21.70 cm LA Vol (A2C):   71.5 ml 43.76 ml/m RA Volume:   59.90 ml  36.66 ml/m LA Vol (A4C):   47.6 ml 29.13 ml/m LA Biplane Vol: 62.6 ml 38.31 ml/m  AORTIC VALVE LVOT Vmax:   87.90 cm/s LVOT Vmean:  58.000 cm/s LVOT VTI:    0.165 m AI PHT:      480 msec   AORTA Ao Root diam: 3.90 cm Ao Asc diam:  3.70 cm  MITRAL VALVE                        TRICUSPID VALVE MV Area (PHT): 4.49 cm             TR Peak grad:   34.0 mmHg MV PHT:        49.01 msec           TR Vmax:        306.00 cm/s MV Decel Time: 169 msec MV E velocity: 60.10 cm/s 103 cm/s   SHUNTS MV A velocity: 52.90 cm/s 70.3 cm/s Systemic VTI:  0.16 m MV E/A ratio:  1.14       1.5       Systemic Diam: 1.90 cm    Lyman Bishop MD Electronically signed by Lyman Bishop MD Signature Date/Time: 07/17/2019/4:21:31 PM       Final      ASSESSMENT/PLAN: This is a 69 y.o. female rheumatologic disorder, multiple other medical problems with cold hands and cyanosis of all ten fingers. DDx includes scleroderma/Raynaud's phenomenon (? Inciting factor, ? Previous hx/sx), thromboangitis obliterans, multiple emboli.  -Will discuss with Dr. Scot Dock.   Risa Grill, PA-C [facility] [phone]

## 2019-08-15 NOTE — Progress Notes (Signed)
[facility]  PROGRESS NOTE  Joanna Ali TIR:[id number] DOB: 27-Jun-1951 DOA: 07/03/2019 PCP: Renaldo Reel, DO   Brief HPI:   69 year old female with a history of hypertension, chronic pericardial effusion, moderate aortic valve regurgitation, former smoker, alcohol abuse initially admitted on 07/06/2019 for progressive shortness of breath with anasarca and fatigue.  She was previously worked up by [facility] rheumatology with negative work-up including ESR, lupus anticoagulant, rheumatoid factor, TSH, ANCA, ANA, light free chain studies.  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  Since that time patient had stopped drinking alcohol as advised.  She has been on diuretics and spironolactone without any improvement in symptoms.    CT of the chest on 07/04/2019 noted moderate pericardial effusion and airspace disease.  She underwent thoracentesis by PCCM which removed 700 mL of the fluid.  Patient underwent right heart cath on 07/06/2019 and pericardial window on 07/12/2019.  Attempts were made to diurese with IV diuretics but patient with worsening kidney function with creatinine up to 5.  Nephrology was formally consulted patient was started on dialysis.  She has been having poor p.o. intake so code tract was placed.  She was then later placed on PEG tube for persistent poor p.o. intake, also patient developed oral pain after NG tube placement.  She is on hemodialysis for AKI with rapid progression to end-stage renal disease, severe anasarca, pericardial effusion, status post pericardial window.  She was recently started on empiric steroids which will be weaned off.  Her labs showed RF positive at 69, INT RNA polymerase antibody +146 concern for scleroderma also she had some thickening of the skin with renal failure and pericardial involvement.   Pulmonary was consulted to evaluate for ILD.  Dr. Regular diet discussed with rheumatology at [facility] who recommended supportive care, acid reflux treated with PPI, joint pain Voltaren gel and Tylenol.  Prednisone taper.  For Raynaud's phenomenon calcium channel blocker.  If patient has interstitial lung disease then she should be evaluated for immunotherapy can be as outpatient when stronger.  Rheumatology did not recommend immunotherapy for just joint pain and skin manifestation.  Dr. Kathlene November, rheumatologist was consulted on phone.  He recommended to start high-dose IV Solu-Medrol 125 mg daily for 3 days and then prednisone 60 mg daily for week then prednisone 50 mg daily for a week then prednisone 40 mg daily for a week then prednisone 30 mg daily for a week which can be continued till seen by rheumatologist as outpatient.  Subjective   Patient seen and examined, denies any chest pain or shortness of breath.  Her hands are cold with peripheral cyanosis noted   Assessment/Plan:     1. ? Scleroderma /anasarca/joint pain/pericardial effusion-patient presented with anasarca, failure to thrive, joint pains, pericardial effusion, Raynaud's phenomenon.  She had positive anti-RNA polymerase 3 antibody.  She was started on IV steroids to treat empirically for rheumatologic etiology.  Rheumatology evaluation in September 2020 showed CRP 13, ESR 25, ANA negative, c-ANCA/p-ANCA negative, scleroderma SCL 70 -, SSS (ro) negative, M spike negative electrophoresis, HIV negative.  Double-stranded DNA negative, ANA negative, RF 71.  Patient received IV Solu-Medrol 125 mg for 3 days then changed to 50 mg p.o. daily.  R[facility]umatology at [facility] recommended quick taper as we were considering scleroderma.  [facility] do not have bed availability at this time.  Dr. Hilma Favors  discussed with rheumatologist Dr. Kathlene November, who recommended high-dose steroids, patient started on Solu-Medrol 125 mg IV for 3 days then switch  to  prednisone 60 mg daily for 2 weeks and then slow taper.  She has completed IV Solu-Medrol 125 mg for 3 days.  Called and discussed with rheumatologist Dr. Kathlene November, he recommends to start 60 mg prednisone daily for a week then wean down to 50 mg daily for a week then 40 mg daily for a week then 30 mg daily.  Patient can then continue with prednisone 30 mg daily and follow-up with rheumatology as outpatient.  Will start prednisone 60-milligram daily for 1 week from 08/14/2019.  2. Raynaud's phenomenon/crest syndrome-patient has cold hands with peripheral cyanosis, likely due to underlying scleroderma with worsening due to beta-blocker.  Beta-blocker will be discontinued and switched to Cardizem.  I also called and discussed vascular surgery Dr. Trula Slade, he will see patient today and make further recommendations.  3. Atrial fibrillation RVR-patient was on Coreg. Patient is not a good candidate for anticoagulation considering multiple comorbid conditions, pericardial window.  Cardiology has been following patient since her hospitalization.  Appreciate cardiology recommendations.  Coreg was changed to metoprolol.  Patient likely has developed worsening of Raynaud's phenomenon from metoprolol.  Discussed with cardiology.  She will be switched to Cardizem.    4. Leukopenia/? Malignancy -resolved, today WBC is 4.2.  Patient developed leukopenia, oncology was consulted.  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 Jana Hakim saw the patient and does not recommend further search for malignancy.  As patient had extensive imaging this admission with no malignancy identified, CT chest noncontrast yesterday was negative, CT abdomen/pelvis on 1/16 was unremarkable and repeat SPEP showed no monoclonal protein with normal kappa lambda ratio.  She also had negative pleural fluid cytology x4.  Screening malignancy can be done as outpatient.  5. ESRD/intermittent hyperkalemia- new on HD-patient has progressively  AKI found to be exacerbated by contrast-induced nephropathy.  She was started on hemodialysis with improvement of anasarca.  Outpatient dialysis has been set up.  Continue hemodialysis Tuesday Thursday and Saturday.  6. Lethargy/encephalopathy-resolved, patient became lethargic and hemodialysis on 08/02/2019 she was receiving Ativan at that time.  Fentanyl was stopped, opiates were decreased.  Ammonia level 29, she received IV thiamine 250 mg 3 times daily for 3 days.  Thiamine level is 192.5.  7. Acute hypoxic respiratory failure/pulmonary edema/pleural effusion-stable, she has been weaned off oxygen.  Currently 100% on room air.  She was started on IV Solu-Medrol by palliative care for presumed autoimmune process.  She had worsening dyspnea with pleural effusion, she underwent repeat thoracentesis on 08/01/2019.  Removed 1.1 L from left and 500 mL from right.  Pleural cytology showed reactive mesothelial cells.  Pleural fluid culture showed no growth.  PCCM was consulted for possible ILD.  8. Pericardial effusion with elevated troponin-patient is status post pericardial window on 07/12/2019.  Pathology showed no malignancy, chronic inflammation, AFB negative.  Fungal culture came back negative.  9. Atypical pneumonia with hypoxia-completed treatment.  10. Severe malnutrition/failure to thrive-continue with tube feeding.  Speech therapy consulted, and patient started on regular diet.  11. Dysphagia/thrush-group A strep was negative on 07/28/2019, patient currently on oral nystatin, oral Diflucan.  12. Anxiety-continue Zoloft, started on Xanax 0.25 mg 3 times daily as needed for panic attack.    13. Back pain-continue with oxycodone as needed, Voltaren gel twice a day.  14. Hypertension-continue Coreg, Norvasc.    SpO2: 98 % O2 Flow  Rate (L/min): 2 L/min     Lab Results  Component Value Date   SARSCOV2NAA NEGATIVE 07/04/2019   [place] NEGATIVE 07/03/2019   [place] NEGATIVE  04/04/2019   SARSCOV2NAA NOT DETECTED 03/25/2019     CBG: Recent Labs  Lab 08/14/19 1109 08/14/19 1618 08/14/19 1955 08/15/19 0010 08/15/19 0414  GLUCAP 108* 123* 114* 112* 94    CBC: Recent Labs  Lab 08/08/19 1052 08/08/19 1052 08/09/19 0722 08/10/19 0626 08/11/19 0234 08/12/19 0441 08/15/19 0251  WBC 1.4*   < > 1.0* 1.5* 1.6* 4.2 8.2  NEUTROABS 0.6*  --   --  0.6*  --   --   --   HGB 10.9*   < > 10.5* 12.1 11.5* 12.4 12.9  HCT 36.8   < > 34.8* 41.7 39.8 42.9 43.9  MCV 96.1   < > 96.1 96.5 96.8 94.9 94.0  PLT 291   < > 301 251 234 252 174   < > = values in this interval not displayed.    Basic Metabolic Panel: Recent Labs  Lab 08/11/19 0234 08/12/19 0441 08/13/19 0734 08/14/19 0603 08/15/19 0251  NA 131* 133* 133* 137 135  K 4.8 4.5 5.6* 4.4 5.2*  CL 92* 96* 92* 95* 93*  CO2 21* 20* 21* 22 22  GLUCOSE 140* 148* 130* 45* 89  BUN 108* 79* 153* 82* 128*  CREATININE 3.64* 2.71* 3.78* 2.49* 3.50*  CALCIUM 9.3 9.3 9.7 9.2 9.5  PHOS 5.8* 4.6 5.7* 4.1 4.9*     Liver Function Tests: Recent Labs  Lab 08/11/19 0234 08/12/19 0441 08/13/19 0734 08/14/19 0603 08/15/19 0251  ALBUMIN 3.0* 3.1* 3.2* 3.1* 3.0*        DVT prophylaxis: Heparin  Code Status: Full code  Family Communication: Discussed with patient's husband on 08/14/2019.  He is agreeable for her to go to rehab at skilled facility.  Disposition Plan: likely skilled nursing facility   Pressure Injury 07/19/19 Sacrum Posterior;Mid Stage 2 -  Partial thickness loss of dermis presenting as a shallow open injury with a red, pink wound bed without slough. (Active)  07/19/19 0600  Location: Sacrum  Location Orientation: Posterior;Mid  Staging: Stage 2 -  Partial thickness loss of dermis presenting as a shallow open injury with a red, pink wound bed without slough.  Wound Description (Comments):   Present on Admission: No     Pressure Injury 07/22/19 Nare Right;Left Stage 2 -  Partial thickness  loss of dermis presenting as a shallow open injury with a red, pink wound bed without slough. Injury r/t cortrak (Active)  07/22/19 1000  Location: Nare  Location Orientation: Right;Left  Staging: Stage 2 -  Partial thickness loss of dermis presenting as a shallow open injury with a red, pink wound bed without slough.  Wound Description (Comments): Injury r/t cortrak  Present on Admission: No        Scheduled medications:  . acetaminophen  500 mg Oral BID  . bisacodyl  10 mg Rectal Daily  . Chlorhexidine Gluconate Cloth  6 each Topical Q0600  . collagenase   Topical BID  . [START ON 08/19/2019] darbepoetin (ARANESP) injection - NON-DIALYSIS  100 mcg Subcutaneous Q Sat-1800  . diclofenac Sodium  2 g Topical BID  . famotidine  10 mg Oral Daily  . feeding supplement (PRO-STAT SUGAR FREE 64)  30 mL Per Tube Daily  . gabapentin  100 mg Per Tube Q12H  . Gerhardt's butt cream   Topical TID  . heparin  5,000 Units Subcutaneous Q8H  . loratadine  10 mg Oral Daily  . mouth rinse  15 mL Mouth Rinse BID  . metoprolol tartrate  25 mg Oral BID  . neomycin-bacitracin-polymyxin   Topical BID  . nystatin  5 mL Mouth/Throat QID  . pantoprazole sodium  40 mg Per Tube BID  . predniSONE  60 mg Oral Q breakfast  . sertraline  25 mg Oral BID  . sodium chloride flush  3 mL Intravenous Q12H  . sodium chloride irrigation  15 mL Irrigation QID  . thiamine  100 mg Oral Daily    Consultants:  Oncology  Nephrology  Palliative care  Procedures:    Antibiotics:   Anti-infectives (From admission, onward)   Start     Dose/Rate Route Frequency Ordered Stop   07/29/19 1500  fluconazole (DIFLUCAN) tablet 200 mg     200 mg Oral Daily 07/29/19 1458 08/11/19 2116   07/28/19 1300  ceFAZolin (ANCEF) 2-4 GM/100ML-% IVPB    Note to Pharmacy: Kandy Garrison   : cabinet override      07/28/19 1300 07/28/19 1806   07/28/19 0700  ceFAZolin (ANCEF) IVPB 2g/100 mL premix     2 g 200 mL/hr over 30  Minutes Intravenous On call 07/27/19 1502 07/28/19 1340   07/18/19 1316  ceFAZolin (ANCEF) 2-4 GM/100ML-% IVPB    Note to Pharmacy: Manuela Neptune   : cabinet override      07/18/19 1316 07/18/19 1318   07/12/19 2115  ceFAZolin (ANCEF) IVPB 1 g/50 mL premix     1 g 100 mL/hr over 30 Minutes Intravenous Every 8 hours 07/12/19 2102 07/13/19 0516   07/03/19 2245  cefTRIAXone (ROCEPHIN) 2 g in sodium chloride 0.9 % 100 mL IVPB  Status:  Discontinued     2 g 200 mL/hr over 30 Minutes Intravenous Every 24 hours 07/03/19 2230 07/04/19 1336   07/03/19 2245  azithromycin (ZITHROMAX) 500 mg in sodium chloride 0.9 % 250 mL IVPB  Status:  Discontinued     500 mg 250 mL/hr over 60 Minutes Intravenous Every 24 hours 07/03/19 2230 07/05/19 1037       Objective   Vitals:   08/15/19 0800 08/15/19 0830 08/15/19 0900 08/15/19 0930  BP: 103/80 (!) 137/101 107/67 106/72  Pulse: 93 94 93 96  Resp:      Temp:      TempSrc:      SpO2:      Weight:      Height:        Intake/Output Summary (Last 24 hours) at 08/15/2019 0945 Last data filed at 08/14/2019 2207 Gross per 24 hour  Intake 6 ml  Output --  Net 6 ml    01/24 1901 - 01/26 0700 In: 9 [I.V.:9] Out: -   Filed Weights   08/14/19 0424 08/15/19 0257 08/15/19 0704  Weight: 39.5 kg 37.6 kg 42.2 kg    Physical Examination:  General-appears in no acute distress Heart-S1-S2, regular, no murmur auscultated Lungs-clear to auscultation bilaterally, no wheezing or crackles auscultated Abdomen-soft, nontender, no organomegaly Extremities-cold hands , peripheral cyanosis    Data Reviewed:     Cardiac Enzymes: No results for input(s): CKTOTAL, CKMB, CKMBINDEX, TROPONINI in the last 168 hours. BNP (last 3 results) Recent Labs    04/04/19 2000 07/03/19 1930 07/09/19 0347  BNP 76.7 1,124.1* 4,004.9*     Studies:  No results found.   Admission status: Inpatient: Based on patients clinical presentation and evaluation of above  clinical  data, I have made determination that patient meets Inpatient criteria at this time.   Oswald Hillock   [facility] If 7PM-7AM, please contact night-coverage at [email or web address], Office  [phone]  password [email or web address]  08/15/2019, 9:45 AM  LOS: 42 days

## 2019-08-15 NOTE — Plan of Care (Signed)

## 2019-08-15 NOTE — Progress Notes (Addendum)
Palliative Care Progress Note  [name] is doing substantially better following several days of high dose steroids for acute rheumatologic illness- based on lab finding during this admission consistent with systemic scleroderma based on serologic results and clinical findings of calcinosis cutis, esophageal dysmotility,Raynaulds, skin thickening and attributable cardiac and renal pathology. She may also have an overlap syndrome of Rheumatoid arthritis given her very high RF serological results and joint pain and inflammation.   The diagnosis of Scleroderma with overlapping RA is consistent with her past history and progression of illness in the acute setting. She does not have significant liver disease, she was referred to hepatology for unexplained edema, I have reviewed her records from [place] steatosis and hepatologist did not feel her liver was causing any significant impact on her condition, no evidence of ETOH cirrhosis and she does not have heavy alcohol use history which should be corrected in the medical record.  She has been dose reduced on the steroids so will track her pain and progress on Pred 60 dose-physically she looks improved-her skin color, mental status and ability to ambulate have been consistently improving and correlate with pulse dose steroid administration. Hopeful she can now successfully go to IR.  [name] has a long road ahead including the need for ongoing HD and she must improve her strength and overall health to the degree she can be seen and evaluated by Rheumatogy at a tertiary center. She has great support from her husband and can be maganed and maintain QOL with ongoing treatment of her disease.   I plan on doing comprehensive advance care planning with her prior to discharge or during her rehab stay when she is best able to participate and help me document her overall goals of care and preferences. While there are treatment options for the Scleroderma, her organ  involvement and associated ESRD has a high two year mortality rate in terms of prognostication-this elevates the need for anticipatory care planning and establishing the most realistic expectations we can at this point.  Lane Hacker, DO Palliative Medicine [phone]  Time: 35 min Greater than 50%  of this time was spent counseling and coordinating care related to the above assessment and plan.

## 2019-08-16 DIAGNOSIS — L89152 Pressure ulcer of sacral region, stage 2: Secondary | ICD-10-CM

## 2019-08-16 DIAGNOSIS — M6281 Muscle weakness (generalized): Secondary | ICD-10-CM

## 2019-08-16 LAB — GLUCOSE, CAPILLARY
Glucose-Capillary: 114 mg/dL — ABNORMAL HIGH (ref 70–99)
Glucose-Capillary: 119 mg/dL — ABNORMAL HIGH (ref 70–99)
Glucose-Capillary: 155 mg/dL — ABNORMAL HIGH (ref 70–99)
Glucose-Capillary: 161 mg/dL — ABNORMAL HIGH (ref 70–99)
Glucose-Capillary: 50 mg/dL — ABNORMAL LOW (ref 70–99)
Glucose-Capillary: 65 mg/dL — ABNORMAL LOW (ref 70–99)
Glucose-Capillary: 69 mg/dL — ABNORMAL LOW (ref 70–99)

## 2019-08-16 LAB — RENAL FUNCTION PANEL
Albumin: 3 g/dL — ABNORMAL LOW (ref 3.5–5.0)
Anion gap: 14 (ref 5–15)
BUN: 56 mg/dL — ABNORMAL HIGH (ref 8–23)
CO2: 26 mmol/L (ref 22–32)
Calcium: 9.1 mg/dL (ref 8.9–10.3)
Chloride: 94 mmol/L — ABNORMAL LOW (ref 98–111)
Creatinine, Ser: 2.16 mg/dL — ABNORMAL HIGH (ref 0.44–1.00)
GFR calc Af Amer: 26 mL/min — ABNORMAL LOW (ref >60)
GFR calc non Af Amer: 22 mL/min — ABNORMAL LOW (ref >60)
Glucose, Bld: 98 mg/dL (ref 70–99)
Phosphorus: 4.7 mg/dL — ABNORMAL HIGH (ref 2.5–4.6)
Potassium: 4.3 mmol/L (ref 3.5–5.1)
Sodium: 134 mmol/L — ABNORMAL LOW (ref 135–145)

## 2019-08-16 MED ORDER — DEXTROSE 50 % IV SOLN
INTRAVENOUS | Status: AC
  Administered 2019-08-16: 25 mL
  Filled 2019-08-16: qty 50

## 2019-08-16 MED ORDER — CHLORHEXIDINE GLUCONATE CLOTH 2 % EX PADS
6.0000 | MEDICATED_PAD | Freq: Every day | CUTANEOUS | Status: DC
  Administered 2019-08-16 – 2019-08-18 (×3): 6 via TOPICAL

## 2019-08-16 MED ORDER — DILTIAZEM HCL ER COATED BEADS 120 MG PO CP24
120.0000 mg | ORAL_CAPSULE | Freq: Every day | ORAL | Status: DC
  Administered 2019-08-16 – 2019-08-22 (×7): 120 mg via ORAL
  Filled 2019-08-16 (×7): qty 1

## 2019-08-16 NOTE — Progress Notes (Signed)
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 Kipp Brood 07/12/2019.  Initial serological evaluation for autoimmune diseases was essentially negative, although she did have a positive anti-RNA polymerase 3 antibody.  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  Hematology raised the concern of a malignancy or scleroderma although an extensive search for malignancy was not recommended by Dr. Jana Hakim.  Initial plans were for transfer to tertiary center however this did not occur due to lack of bed availability.  She was initially started on dialysis 07/18/2019 due to edema that failed to resolve with IV diuretics and worsening serum creatinine.  She dialyzes with a tunneled dialysis catheter.  Her last dialysis 08/14/2018 with removal of 1.9 L.  Blood pressure 118/75 pulse 77 temperature 97.0 O2 sats 100% room air  Neurontin 100 mg twice daily Claritin 10 mg daily, Lopressor 25 mg twice daily, Protonix 40 mg twice daily, Zoloft 25 mg daily.  Darbepoetin being held due to high hemoglobin.  Prednisone taper starting at 60 mg.  08/15/2019.  Cardizem 120 mg daily  Objective:  Vital signs in last 24 hours:  Temp:  [96.8 F (36 C)-97.7 F (36.5 C)] 96.8 F (36 C) (01/27 0747) Pulse Rate:  [70-103] 77 (01/27 0747) Resp:  [12-26] 12 (01/27 0747) BP: (96-151)/(59-92) 118/75 (01/27 0747) SpO2:  [95 %-100 %] 95 % (01/27 0747) Weight:  [40.8 kg] 40.8 kg (01/26 1112)  Weight change: 4.536 kg Filed Weights   08/15/19 0257 08/15/19 0704 08/15/19 1112  Weight: 37.6 kg 42.2 kg 40.8 kg    Intake/Output: I/O last 3 completed shifts: In:  114 [I.V.:6; IV Piggyback:108] Out: 1900 [Other:1900]   Intake/Output this shift:  No intake/output data recorded. Cachectic and frail CVS- RRR JVP not elevated left tunneled dialysis catheter RS- CTA no wheezes or rales ABD- BS present soft non-distended EXT- no edema   Basic Metabolic Panel: Recent Labs  Lab 08/12/19 0441 08/12/19 0441 08/13/19 0734 08/13/19 0734 08/14/19 0603 08/15/19 0251 08/16/19 0257  NA 133*  --  133*  --  137 135 134*  K 4.5  --  5.6*  --  4.4 5.2* 4.3  CL 96*  --  92*  --  95* 93* 94*  CO2 20*  --  21*  --  22 22 26   GLUCOSE 148*  --  130*  --  45* 89 98  BUN 79*  --  153*  --  82* 128* 56*  CREATININE 2.71*  --  3.78*  --  2.49* 3.50* 2.16*  CALCIUM 9.3   < > 9.7   < > 9.2 9.5 9.1  PHOS 4.6  --  5.7*  --  4.1 4.9* 4.7*   < > = values in this interval not displayed.    Liver Function Tests: Recent Labs  Lab 08/12/19 0441 08/13/19 0734 08/14/19 0603 08/15/19 0251 08/16/19 0257  ALBUMIN 3.1* 3.2* 3.1* 3.0* 3.0*   No results for input(s): LIPASE, AMYLASE in the last 168 hours. No results for input(s): AMMONIA in the last 168 hours.  CBC: Recent Labs  Lab 08/10/19 0626 08/11/19 0234 08/12/19 0441 08/15/19 0251  WBC 1.5* 1.6* 4.2 8.2  NEUTROABS  0.6*  --   --   --   HGB 12.1 11.5* 12.4 12.9  HCT 41.7 39.8 42.9 43.9  MCV 96.5 96.8 94.9 94.0  PLT 251 234 252 174    Cardiac Enzymes: No results for input(s): CKTOTAL, CKMB, CKMBINDEX, TROPONINI in the last 168 hours.  BNP: Invalid input(s): POCBNP  CBG: Recent Labs  Lab 08/15/19 2012 08/15/19 2336 08/16/19 0328 08/16/19 0556 08/16/19 0750  GLUCAP 111* 69* 50* 65* 114*    Microbiology: Results for orders placed or performed during the hospital encounter of 07/03/19  Culture, blood (routine x 2)     Status: None   Collection Time: 07/03/19  7:28 PM   Specimen: BLOOD RIGHT FOREARM  Result Value Ref Range Status   Specimen Description BLOOD RIGHT FOREARM  Final   Special  Requests   Final    BOTTLES DRAWN AEROBIC AND ANAEROBIC Blood Culture adequate volume   Culture   Final    NO GROWTH 5 DAYS Performed at [facility], [place] [address], [place], [place] [address]    Report Status 07/08/2019 FINAL  Final  Culture, blood (routine x 2)     Status: None   Collection Time: 07/03/19  7:34 PM   Specimen: BLOOD LEFT HAND  Result Value Ref Range Status   Specimen Description BLOOD LEFT HAND  Final   Special Requests AEROBIC BOTTLE ONLY Blood Culture adequate volume  Final   Culture   Final    NO GROWTH 5 DAYS Performed at [facility], [place] [address], [place], [place] [address]    Report Status 07/08/2019 FINAL  Final  SARS CORONAVIRUS 2 (TAT 6-24 HRS) Nasopharyngeal Nasopharyngeal Swab     Status: None   Collection Time: 07/03/19 10:11 PM   Specimen: Nasopharyngeal Swab  Result Value Ref Range Status   SARS Coronavirus 2 NEGATIVE NEGATIVE Final    Comment: (NOTE) SARS-CoV-2 target nucleic acids are NOT DETECTED. The SARS-CoV-2 RNA is generally detectable in upper and lower respiratory specimens during the acute phase of infection. Negative results do not preclude SARS-CoV-2 infection, do not rule out co-infections with other pathogens, and should not be used as the sole basis for treatment or other patient management decisions. Negative results must be combined with clinical observations, patient history, and epidemiological information. The expected result is Negative. Fact Sheet for Patients: SugarRoll.be Fact Sheet for Healthcare Providers: https://www.woods-mathews.com/ This test is not yet approved or cleared by the [place] FDA and  has been authorized for detection and/or diagnosis of SARS-CoV-2 by FDA under an Emergency Use Authorization (EUA). This EUA will remain  in effect (meaning this test can be used) for the duration of the COVID-19 declaration under Section 56 4(b)(1) of the Act,  21 U.S.C. section 360bbb-3(b)(1), unless the authorization is terminated or revoked sooner. Performed at [facility], [place] [address], [place], [place] [address]   Respiratory Panel by PCR     Status: None   Collection Time: 07/03/19 10:11 PM   Specimen: Nasopharyngeal Swab; Respiratory  Result Value Ref Range Status   Adenovirus NOT DETECTED NOT DETECTED Final   Coronavirus 229E NOT DETECTED NOT DETECTED Final    Comment: (NOTE) The Coronavirus on the Respiratory Panel, DOES NOT test for the novel  Coronavirus (2019 nCoV)    Coronavirus HKU1 NOT DETECTED NOT DETECTED Final   Coronavirus NL63 NOT DETECTED NOT DETECTED Final   Coronavirus OC43 NOT DETECTED NOT DETECTED Final   Metapneumovirus NOT DETECTED  NOT DETECTED Final   Rhinovirus / Enterovirus NOT DETECTED NOT DETECTED Final   Influenza A NOT DETECTED NOT DETECTED Final   Influenza B NOT DETECTED NOT DETECTED Final   Parainfluenza Virus 1 NOT DETECTED NOT DETECTED Final   Parainfluenza Virus 2 NOT DETECTED NOT DETECTED Final   Parainfluenza Virus 3 NOT DETECTED NOT DETECTED Final   Parainfluenza Virus 4 NOT DETECTED NOT DETECTED Final   Respiratory Syncytial Virus NOT DETECTED NOT DETECTED Final   Bordetella pertussis NOT DETECTED NOT DETECTED Final   Chlamydophila pneumoniae NOT DETECTED NOT DETECTED Final   Mycoplasma pneumoniae NOT DETECTED NOT DETECTED Final    Comment: Performed at [facility], [place] [address], [place], [place] [address]  Respiratory Panel by RT PCR (Flu A&B, Covid) - Nasopharyngeal Swab     Status: None   Collection Time: 07/04/19  1:11 AM   Specimen: Nasopharyngeal Swab  Result Value Ref Range Status   SARS Coronavirus 2 by RT PCR NEGATIVE NEGATIVE Final    Comment: (NOTE) SARS-CoV-2 target nucleic acids are NOT DETECTED. The SARS-CoV-2 RNA is generally detectable in upper respiratoy specimens during the acute phase of infection. The lowest concentration of SARS-CoV-2 viral copies  this assay can detect is 131 copies/mL. A negative result does not preclude SARS-Cov-2 infection and should not be used as the sole basis for treatment or other patient management decisions. A negative result may occur with  improper specimen collection/handling, submission of specimen other than nasopharyngeal swab, presence of viral mutation(s) within the areas targeted by this assay, and inadequate number of viral copies (<131 copies/mL). A negative result must be combined with clinical observations, patient history, and epidemiological information. The expected result is Negative. Fact Sheet for Patients:  PinkCheek.be Fact Sheet for Healthcare Providers:  GravelBags.it This test is not yet ap proved or cleared by the [place] FDA and  has been authorized for detection and/or diagnosis of SARS-CoV-2 by FDA under an Emergency Use Authorization (EUA). This EUA will remain  in effect (meaning this test can be used) for the duration of the COVID-19 declaration under Section 564(b)(1) of the Act, 21 U.S.C. section 360bbb-3(b)(1), unless the authorization is terminated or revoked sooner.    Influenza A by PCR NEGATIVE NEGATIVE Final   Influenza B by PCR NEGATIVE NEGATIVE Final    Comment: (NOTE) The Xpert Xpress SARS-CoV-2/FLU/RSV assay is intended as an aid in  the diagnosis of influenza from Nasopharyngeal swab specimens and  should not be used as a sole basis for treatment. Nasal washings and  aspirates are unacceptable for Xpert Xpress SARS-CoV-2/FLU/RSV  testing. Fact Sheet for Patients: PinkCheek.be Fact Sheet for Healthcare Providers: GravelBags.it This test is not yet approved or cleared by the [place] FDA and  has been authorized for detection and/or diagnosis of SARS-CoV-2 by  FDA under an Emergency Use Authorization (EUA). This EUA will remain  in  effect (meaning this test can be used) for the duration of the  Covid-19 declaration under Section 564(b)(1) of the Act, 21  U.S.C. section 360bbb-3(b)(1), unless the authorization is  terminated or revoked. Performed at [facility], [place] [address], [place], [place] [address]   MRSA PCR Screening     Status: None   Collection Time: 07/04/19  7:00 AM   Specimen: Nasopharyngeal  Result Value Ref Range Status   MRSA by PCR NEGATIVE NEGATIVE Final    Comment:        The GeneXpert MRSA Assay (FDA approved  for NASAL specimens only), is one component of a comprehensive MRSA colonization surveillance program. It is not intended to diagnose MRSA infection nor to guide or monitor treatment for MRSA infections. Performed at [facility], [place] [address], [place], [place] [address]   Body fluid culture     Status: None   Collection Time: 07/04/19  1:23 PM   Specimen: Pleura; Body Fluid  Result Value Ref Range Status   Specimen Description PLEURAL RIGHT  Final   Special Requests NONE  Final   Gram Stain   Final    RARE WBC PRESENT, PREDOMINANTLY MONONUCLEAR NO ORGANISMS SEEN    Culture   Final    NO GROWTH Performed at [facility], [address], [place], [place] [address]    Report Status 07/07/2019 FINAL  Final  Body fluid culture (includes gram stain)     Status: None   Collection Time: 07/04/19  1:23 PM   Specimen: Pleural Fluid  Result Value Ref Range Status   Specimen Description PLEURAL LEFT  Final   Special Requests NONE  Final   Gram Stain   Final    RARE WBC PRESENT, PREDOMINANTLY MONONUCLEAR NO ORGANISMS SEEN    Culture   Final    NO GROWTH Performed at [facility], [place] [address] [place], [place] [address]    Report Status 07/07/2019 FINAL  Final  Surgical pcr screen     Status: None   Collection Time: 07/12/19  3:31 PM   Specimen: Nasal Mucosa; Nasal Swab  Result Value Ref Range Status   MRSA, PCR NEGATIVE NEGATIVE Final    Staphylococcus aureus NEGATIVE NEGATIVE Final    Comment: (NOTE) The Xpert SA Assay (FDA approved for NASAL specimens in patients 61 years of age and older), is one component of a comprehensive surveillance program. It is not intended to diagnose infection nor to guide or monitor treatment. Performed at [facility], [place] [address], [place], [place] [address]   Fungus Culture With Stain     Status: None   Collection Time: 07/12/19  5:45 PM   Specimen: PATH Cytology Misc. fluid; Body Fluid  Result Value Ref Range Status   Fungus Stain Final report  Final   Fungus (Mycology) Culture Final report  Final    Comment: (NOTE) Performed At: [facility] [address] [place], [place] [id number] Rush Farmer MD [id number]    Fungal Source FLUID  Final    Comment: PERICARDIAL Performed at [facility], [place] [address] [place], [place] [address]   Aerobic/Anaerobic Culture (surgical/deep wound)     Status: None   Collection Time: 07/12/19  5:45 PM   Specimen: PATH Cytology Misc. fluid; Body Fluid  Result Value Ref Range Status   Specimen Description FLUID PERICARDIAL  Final   Special Requests NONE  Final   Gram Stain NO WBC SEEN NO ORGANISMS SEEN   Final   Culture   Final    No growth aerobically or anaerobically. Performed at [facility], [place] [address] [place], [place] [address]    Report Status 07/17/2019 FINAL  Final  Acid Fast Smear (AFB)     Status: None   Collection Time: 07/12/19  5:45 PM   Specimen: PATH Cytology Misc. fluid; Body Fluid  Result Value Ref Range Status   AFB Specimen Processing Concentration  Final   Acid Fast Smear Negative  Final    Comment: (NOTE) Performed At: [place]  [place], [place] [id number] Rush Farmer MD [id number]    Source (AFB) FLUID  Final    Comment: PERICARDIAL Performed at [facility], [place] [address], [place], [place] [address]   Fungus Culture Result      Status: None   Collection Time: 07/12/19  5:45 PM  Result Value Ref Range Status   Result 1 Comment  Final    Comment: (NOTE) KOH/Calcofluor preparation:  no fungus observed. Performed At: [facility] [place], [place] [id number] Rush Farmer MD [id number]   Fungal organism reflex     Status: None   Collection Time: 07/12/19  5:45 PM  Result Value Ref Range Status   Fungal result 1 Comment  Final    Comment: (NOTE) No yeast or mold isolated after 4 weeks. Performed At: [facility] [place], [place] [id number] Rush Farmer MD [id number]   Culture, group A strep     Status: None   Collection Time: 07/26/19 12:21 PM   Specimen: Throat  Result Value Ref Range Status   Specimen Description THROAT  Final   Special Requests NONE  Final   Culture   Final    NO GROUP A STREP (S.PYOGENES) ISOLATED Performed at [facility], [place] [address], [place], [place] [address]    Report Status 07/28/2019 FINAL  Final    Coagulation Studies: No results for input(s): LABPROT, INR in the last 72 hours.  Urinalysis: No results for input(s): COLORURINE, LABSPEC, PHURINE, GLUCOSEU, HGBUR, BILIRUBINUR, KETONESUR, PROTEINUR, UROBILINOGEN, NITRITE, LEUKOCYTESUR in the last 72 hours.  Invalid input(s): APPERANCEUR    Imaging: No results found.   Medications:   . sodium chloride    . sodium chloride    . sodium chloride    . sodium chloride    . sodium chloride    . feeding supplement (NEPRO CARB STEADY) 1,000 mL (08/15/19 0610)  . ondansetron (ZOFRAN) IV Stopped (08/15/19 1232)   . acetaminophen  500 mg Oral BID  . bisacodyl  10 mg Rectal Daily  . Chlorhexidine Gluconate Cloth  6 each Topical Q0600  . collagenase   Topical BID  . [START ON 08/19/2019] darbepoetin (ARANESP) injection - DIALYSIS  100 mcg Intravenous Q Sat-HD  . diclofenac Sodium  2 g Topical BID  . diltiazem  120 mg Oral Daily  . famotidine  10 mg Oral Daily   . feeding supplement (PRO-STAT SUGAR FREE 64)  30 mL Per Tube Daily  . gabapentin  100 mg Per Tube Q12H  . Gerhardt's butt cream   Topical TID  . heparin  5,000 Units Subcutaneous Q8H  . loratadine  10 mg Oral Daily  . mouth rinse  15 mL Mouth Rinse BID  . multivitamin  1 tablet Oral QHS  . neomycin-bacitracin-polymyxin   Topical BID  . nystatin  5 mL Mouth/Throat QID  . pantoprazole sodium  40 mg Per Tube BID  . predniSONE  60 mg Oral Q breakfast   Followed by  . [START ON 08/22/2019] predniSONE  50 mg Oral Q breakfast   Followed by  . [START ON 09/10/2019] predniSONE  40 mg Oral Q breakfast   Followed by  . [START ON 08/27/2019] predniSONE  30 mg Oral Q breakfast  . sertraline  25 mg Oral BID  . sodium chloride flush  3 mL Intravenous Q12H  . sodium chloride irrigation  15 mL Irrigation QID  . thiamine  100 mg Oral Daily  sodium chloride, sodium chloride, sodium chloride, sodium chloride, sodium chloride, acetaminophen (TYLENOL) oral liquid 160 mg/5 mL, ALPRAZolam, alteplase, alum & mag hydroxide-simeth, antiseptic oral rinse, bisacodyl, bisacodyl, camphor-menthol, fentaNYL (SUBLIMAZE) injection, heparin, hydrOXYzine, ondansetron (ZOFRAN) IV, oxyCODONE, polyethylene glycol, polyvinyl alcohol, senna-docusate, sodium chloride flush, witch hazel-glycerin  Assessment/ Plan:   Acute kidney injury with a creatinine of 1.43 07/03/2019 thought to be hemodynamically mediated possible hepatorenal syndrome with concomitant use of IV contrast urinalysis was unremarkable autoimmune work-up has been unremarkable however there is some consideration of scleroderma.  She was initiated on dialysis due to progressive loss of renal function and she is anuric this was started 07/18/2019.  She no longer has a slot at [facility] for dialysis.  She tolerated dialysis 08/15/2019 we will order dialysis 08/17/2019  Anemia ESA was held 08/11/2019  Malnutrition low albumin  Moderate to large pericardial effusion  status post pericardial window 08/11/2018  Liver disease diagnosed in [facility] with history of steatosis.  Scleroderma with crest syndrome.  Initiated on prednisone taper starting at 60 mg daily 08/15/2019  Paroxysmal atrial fibrillation   appreciate assistance from Dr. Davina Poke.  No anticoagulation for now due to high risk of bleeding recent pericardial effusion and window.  Patient awaiting transfer to [facility] to [place].  Palliative care following   LOS: [place] @TODAY @8 :31 AM

## 2019-08-16 NOTE — Progress Notes (Signed)
Physical Therapy Treatment Patient Details Name: Joanna Ali MRN: [id number] DOB: 06-13-1951 Today's Date: 08/16/2019    History of Present Illness Pt is a 69 y.o. female admitted 07/03/19 with anasarca, fatigue and tachycardia. 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 Pt with pericardial effusion s/p pericardial window. PEG tube placed. PMH includes former tobacco and alcohol abuse, HTN, chronic pleural effusion.    PT Comments    Patient seen for mobility progression. Pt requires more assist for gait training this session due to fatigue but still making progress toward PT goals. PT will continue to follow acutely and progress as tolerated.    Follow Up Recommendations  SNF;Supervision/Assistance - 24 hour(if aligning with goals of care)     Equipment Recommendations  Wheelchair (measurements PT);Wheelchair cushion (measurements PT);Hospital bed(if going home)    Recommendations for Other Services Rehab consult     Precautions / Restrictions Precautions Precautions: Fall Precaution Comments: PEG tube Restrictions Weight Bearing Restrictions: No    Mobility  Bed Mobility Overal bed mobility: Needs Assistance Bed Mobility: Rolling;Sidelying to Sit Rolling: Modified independent (Device/Increase time) Sidelying to sit: Min assist       General bed mobility comments: pt able to roll onto R side with use of rail; assist to elevate trunk into sitting   Transfers Overall transfer level: Needs assistance Equipment used: Rolling walker (2 wheeled) Transfers: Sit to/from Stand Sit to Stand: Min assist;+2 safety/equipment         General transfer comment: cues for hand placement and positioning of bilat LE prior to standing trials; pt stood X 2 from EOB; assist to power up into standing   Ambulation/Gait Ambulation/Gait assistance: +2 safety/equipment;Mod assist(chair follow) Gait Distance (Feet): (~4 ft then 5 ft with seated break) Assistive  device: Rolling walker (2 wheeled) Gait Pattern/deviations: Trunk flexed;Step-through pattern;Decreased stride length;Shuffle Gait velocity: decreased   General Gait Details: multimodal cues for upright posture; pt with increased trunnk flexion and posterior bias this session; pt attempting to sit without chair close behind; assist to steady and manage RW    Stairs             Wheelchair Mobility    Modified Rankin (Stroke Patients Only)       Balance Overall balance assessment: Needs assistance Sitting-balance support: Single extremity supported;Feet supported;No upper extremity supported Sitting balance-Leahy Scale: Fair     Standing balance support: Bilateral upper extremity supported Standing balance-Leahy Scale: Poor                              Cognition Arousal/Alertness: Awake/alert Behavior During Therapy: WFL for tasks assessed/performed Overall Cognitive Status: Within Functional Limits for tasks assessed Area of Impairment: Problem solving;Safety/judgement;Following commands                       Following Commands: Follows one step commands with increased time Safety/Judgement: Decreased awareness of safety;Decreased awareness of deficits   Problem Solving: Slow processing;Requires verbal cues;Decreased initiation;Requires tactile cues General Comments: pt very focused on wanting ice       Exercises      General Comments General comments (skin integrity, edema, etc.): HR 90s-110s       Pertinent Vitals/Pain Pain Assessment: Faces Faces Pain Scale: Hurts little more Pain Location: buttocks Pain Descriptors / Indicators: Sore Pain Intervention(s): Repositioned    Home Living  Prior Function            PT Goals (current goals can now be found in the care plan section) Progress towards PT goals: Progressing toward goals    Frequency    Min 2X/week      PT Plan Current plan remains  appropriate    Co-evaluation PT/OT/SLP Co-Evaluation/Treatment: Yes Reason for Co-Treatment: Complexity of the patient's impairments (multi-system involvement);For patient/therapist safety;To address functional/ADL transfers PT goals addressed during session: Mobility/safety with mobility        AM-PAC PT "6 Clicks" Mobility   Outcome Measure  Help needed turning from your back to your side while in a flat bed without using bedrails?: A Lot Help needed moving from lying on your back to sitting on the side of a flat bed without using bedrails?: A Lot Help needed moving to and from a bed to a chair (including a wheelchair)?: A Little Help needed standing up from a chair using your arms (e.g., wheelchair or bedside chair)?: A Little Help needed to walk in hospital room?: A Lot Help needed climbing 3-5 steps with a railing? : Total 6 Click Score: 13    End of Session Equipment Utilized During Treatment: Gait belt Activity Tolerance: Patient limited by fatigue Patient left: in chair;with call bell/phone within reach Nurse Communication: Mobility status PT Visit Diagnosis: Unsteadiness on feet (R26.81);Muscle weakness (generalized) (M62.81);Difficulty in walking, not elsewhere classified (R26.2);Pain Pain - Right/Left: (central) Pain - part of body: (sacrum)     Time: 2035-5974 PT Time Calculation (min) (ACUTE ONLY): 35 min  Charges:  $Gait Training: 8-22 mins                     Earney Navy, PTA Acute Rehabilitation Services Pager: [phone] Office: [phone]     Darliss Cheney 08/16/2019, 10:37 AM

## 2019-08-16 NOTE — Progress Notes (Signed)
Occupational Therapy Treatment Patient Details Name: Joanna Ali MRN: [id number] DOB: Sep 05, 1950 Today's Date: 08/16/2019    History of present illness Pt is a 69 y.o. female admitted 07/03/19 with anasarca, fatigue and tachycardia. 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 Pt with pericardial effusion s/p pericardial window. PEG tube placed. PMH includes former tobacco and alcohol abuse, HTN, chronic pleural effusion.   OT comments  Pt progressing slowly, but enough such that goals were updated today. Fatigues very easily with the slightest of activity and asking for ice chips throughout session. Pt able to self feed, brush her hair with assistance to reach the back and don front opening gown with mod assist. Pt with HR to 114 with ambulation bouts x 2.   Follow Up Recommendations  SNF;Supervision/Assistance - 24 hour    Equipment Recommendations  Hospital bed;Wheelchair (measurements OT);Wheelchair cushion (measurements OT);3 in 1 bedside commode    Recommendations for Other Services      Precautions / Restrictions Precautions Precautions: Fall Precaution Comments: PEG tube Restrictions Weight Bearing Restrictions: No       Mobility Bed Mobility Overal bed mobility: Needs Assistance Bed Mobility: Rolling;Sidelying to Sit Rolling: Modified independent (Device/Increase time) Sidelying to sit: Min assist       General bed mobility comments: pt able to roll onto R side with use of rail; assist to elevate trunk into sitting   Transfers Overall transfer level: Needs assistance Equipment used: Rolling walker (2 wheeled) Transfers: Sit to/from Stand Sit to Stand: Min assist;+2 physical assistance         General transfer comment: cues for hand placement and positioning of bilat LE prior to standing trials; pt stood X 2 from EOB; assist to power up into standing     Balance Overall balance assessment: Needs assistance Sitting-balance support:  Single extremity supported;Feet supported;No upper extremity supported Sitting balance-Leahy Scale: Fair     Standing balance support: Bilateral upper extremity supported Standing balance-Leahy Scale: Poor                             ADL either performed or assessed with clinical judgement   ADL Overall ADL's : Needs assistance/impaired Eating/Feeding: Set up;Sitting Eating/Feeding Details (indicate cue type and reason): ice chips with spoon Grooming: Independent;Sitting;Minimal assistance Grooming Details (indicate cue type and reason): assist to reach back of head         Upper Body Dressing : Maximal assistance;Sitting Upper Body Dressing Details (indicate cue type and reason): front opening gown                 Functional mobility during ADLs: Moderate assistance;+2 for safety/equipment;Rolling walker       Vision       Perception     Praxis      Cognition Arousal/Alertness: Awake/alert Behavior During Therapy: WFL for tasks assessed/performed Overall Cognitive Status: Impaired/Different from baseline Area of Impairment: Problem solving;Safety/judgement;Following commands                       Following Commands: Follows one step commands with increased time Safety/Judgement: Decreased awareness of safety;Decreased awareness of deficits   Problem Solving: Slow processing;Requires verbal cues;Decreased initiation;Requires tactile cues General Comments: pt very focused on wanting ice         Exercises     Shoulder Instructions       General Comments HR 90s-110s     Pertinent Vitals/  Pain       Pain Assessment: Faces Faces Pain Scale: Hurts little more Pain Location: buttocks Pain Descriptors / Indicators: Sore Pain Intervention(s): Monitored during session;Repositioned  Home Living                                          Prior Functioning/Environment              Frequency  Min 1X/week         Progress Toward Goals  OT Goals(current goals can now be found in the care plan section)  Progress towards OT goals: Progressing toward goals  Acute Rehab OT Goals Patient Stated Goal: to get stronger, possibly transfer hospitals per RN OT Goal Formulation: With family Time For Goal Achievement: 08/25/19 Potential to Achieve Goals: Fair ADL Goals Pt Will Perform Grooming: with min assist;standing(one activity) Pt Will Perform Upper Body Bathing: with mod assist;sitting Pt Will Transfer to Toilet: with min assist;ambulating;bedside commode Pt Will Perform Toileting - Clothing Manipulation and hygiene: with mod assist;sit to/from stand Additional ADL Goal #1: Pt will perform bed mobility with supervision in preparation for ADL.  Plan Discharge plan remains appropriate    Co-evaluation    PT/OT/SLP Co-Evaluation/Treatment: Yes Reason for Co-Treatment: For patient/therapist safety PT goals addressed during session: Mobility/safety with mobility OT goals addressed during session: ADL's and self-care;Proper use of Adaptive equipment and DME      AM-PAC OT "6 Clicks" Daily Activity     Outcome Measure   Help from another person eating meals?: A Little Help from another person taking care of personal grooming?: A Little Help from another person toileting, which includes using toliet, bedpan, or urinal?: Total Help from another person bathing (including washing, rinsing, drying)?: A Lot Help from another person to put on and taking off regular upper body clothing?: A Lot Help from another person to put on and taking off regular lower body clothing?: Total 6 Click Score: 12    End of Session Equipment Utilized During Treatment: Gait belt;Rolling walker  OT Visit Diagnosis: Unsteadiness on feet (R26.81);Pain;Muscle weakness (generalized) (M62.81);Other symptoms and signs involving cognitive function   Activity Tolerance Patient limited by fatigue   Patient Left in  chair;with call bell/phone within reach   Nurse Communication          Time: 1324-4010 OT Time Calculation (min): 34 min  Charges: OT General Charges $OT Visit: 1 Visit OT Treatments $Self Care/Home Management : 8-22 mins  [name], OTR/L Acute Rehabilitation Services Pager: [phone] Office: [phone]  Joanna Ali 08/16/2019, 10:47 AM

## 2019-08-16 NOTE — Progress Notes (Signed)
Cardiology Progress Note  Patient ID: Joanna Ali MRN: [id number] DOB: 03/31/1951 Date of Encounter: 08/16/2019  Primary Cardiologist: Evalina Field, MD  Subjective  Evaluated by vascular surgery for worsening Raynaud's and concerns for ischemic digits.  They feel no intervention is necessary and this is related to her scleroderma.  She appears without complaints this morning.  Tolerating diltiazem well.  ROS:  All other ROS reviewed and negative. Pertinent positives noted in the HPI.     Inpatient Medications  Scheduled Meds: . acetaminophen  500 mg Oral BID  . bisacodyl  10 mg Rectal Daily  . Chlorhexidine Gluconate Cloth  6 each Topical Q0600  . collagenase   Topical BID  . [START ON 08/19/2019] darbepoetin (ARANESP) injection - DIALYSIS  100 mcg Intravenous Q Sat-HD  . diclofenac Sodium  2 g Topical BID  . diltiazem  120 mg Oral Daily  . famotidine  10 mg Oral Daily  . feeding supplement (PRO-STAT SUGAR FREE 64)  30 mL Per Tube Daily  . gabapentin  100 mg Per Tube Q12H  . Gerhardt's butt cream   Topical TID  . heparin  5,000 Units Subcutaneous Q8H  . loratadine  10 mg Oral Daily  . mouth rinse  15 mL Mouth Rinse BID  . multivitamin  1 tablet Oral QHS  . neomycin-bacitracin-polymyxin   Topical BID  . nystatin  5 mL Mouth/Throat QID  . pantoprazole sodium  40 mg Per Tube BID  . predniSONE  60 mg Oral Q breakfast   Followed by  . [START ON 08/22/2019] predniSONE  50 mg Oral Q breakfast   Followed by  . [START ON 09/17/2019] predniSONE  40 mg Oral Q breakfast   Followed by  . [START ON 08/21/2019] predniSONE  30 mg Oral Q breakfast  . sertraline  25 mg Oral BID  . sodium chloride flush  3 mL Intravenous Q12H  . sodium chloride irrigation  15 mL Irrigation QID  . thiamine  100 mg Oral Daily   Continuous Infusions: . sodium chloride    . sodium chloride    . sodium chloride    . sodium chloride    . sodium chloride    . feeding supplement (NEPRO CARB STEADY) 1,000 mL  (08/15/19 0610)  . ondansetron [facility]) IV Stopped (08/15/19 1232)   PRN Meds: sodium chloride, sodium chloride, sodium chloride, sodium chloride, sodium chloride, acetaminophen (TYLENOL) oral liquid 160 mg/5 mL, ALPRAZolam, alteplase, alum & mag hydroxide-simeth, antiseptic oral rinse, bisacodyl, bisacodyl, camphor-menthol, fentaNYL (SUBLIMAZE) injection, heparin, hydrOXYzine, ondansetron (ZOFRAN) IV, oxyCODONE, polyethylene glycol, polyvinyl alcohol, senna-docusate, sodium chloride flush, witch hazel-glycerin   Vital Signs   Vitals:   08/15/19 1940 08/15/19 2338 08/16/19 0331 08/16/19 0747  BP: 127/89 134/84 (!) 151/82 118/75  Pulse: 77  98 77  Resp: (!) 23 12 18 12   Temp: (!) 97.5 F (36.4 C) 97.6 F (36.4 C) 97.6 F (36.4 C) (!) 96.8 F (36 C)  TempSrc: Oral Oral Oral Axillary  SpO2: 100% 100% 100% 95%  Weight:      Height:        Intake/Output Summary (Last 24 hours) at 08/16/2019 0835 Last data filed at 08/16/2019 0600 Gross per 24 hour  Intake 111 ml  Output 1900 ml  Net -1789 ml   Last 3 Weights 08/16/2019 08/15/2019 08/15/2019  Weight (lbs) (No Data) 90 lb 93 lb  Weight (kg) (No Data) 40.824 kg 42.185 kg      Telemetry  Overnight telemetry shows normal  sinus rhythm with frequent PACs, heart rate in the 80s, which I personally reviewed.   ECG  The most recent ECG shows normal sinus rhythm, heart rate 89, EKG dated 08/09/2019, which I personally reviewed.   Physical Exam   Vitals:   08/15/19 1940 08/15/19 2338 08/16/19 0331 08/16/19 0747  BP: 127/89 134/84 (!) 151/82 118/75  Pulse: 77  98 77  Resp: (!) 23 12 18 12   Temp: (!) 97.5 F (36.4 C) 97.6 F (36.4 C) 97.6 F (36.4 C) (!) 96.8 F (36 C)  TempSrc: Oral Oral Oral Axillary  SpO2: 100% 100% 100% 95%  Weight:      Height:         Intake/Output Summary (Last 24 hours) at 08/16/2019 0835 Last data filed at 08/16/2019 0600 Gross per 24 hour  Intake 111 ml  Output 1900 ml  Net -1789 ml    Last 3 Weights  08/16/2019 08/15/2019 08/15/2019  Weight (lbs) (No Data) 90 lb 93 lb  Weight (kg) (No Data) 40.824 kg 42.185 kg    Body mass index is 14.53 kg/m.  General: Frail, ill-appearing Head: Atraumatic, normal size  Eyes: PEERLA, EOMI  Neck: Supple, no JVD Endocrine: No thryomegaly Cardiac: Normal S1, S2; RRR; no murmurs, rubs, or gallops Lungs: Diminished breath sounds bilaterally at the lung bases Abd: Soft, nontender, no hepatomegaly  Ext: Upper extremity digits are noticeably blue Musculoskeletal: No deformities, BUE and BLE strength normal and equal Skin: Warm and dry, no rashes   Neuro: Alert and oriented to person, place, time, and situation, CNII-XII grossly intact, no focal deficits  Psych: Normal mood and affect   Labs  High Sensitivity Troponin:  No results for input(s): TROPONINIHS in the last 720 hours.   Cardiac EnzymesNo results for input(s): TROPONINI in the last 168 hours. No results for input(s): TROPIPOC in the last 168 hours.  Chemistry Recent Labs  Lab 08/14/19 0603 08/15/19 0251 08/16/19 0257  NA 137 135 134*  K 4.4 5.2* 4.3  CL 95* 93* 94*  CO2 22 22 26   GLUCOSE 45* 89 98  BUN 82* 128* 56*  CREATININE 2.49* 3.50* 2.16*  CALCIUM 9.2 9.5 9.1  ALBUMIN 3.1* 3.0* 3.0*  GFRNONAA 19* 13* 22*  GFRAA 22* 15* 26*  ANIONGAP 20* 20* 14    Hematology Recent Labs  Lab 08/11/19 0234 08/12/19 0441 08/15/19 0251  WBC 1.6* 4.2 8.2  RBC 4.11 4.52 4.67  HGB 11.5* 12.4 12.9  HCT 39.8 42.9 43.9  MCV 96.8 94.9 94.0  MCH 28.0 27.4 27.6  MCHC 28.9* 28.9* 29.4*  RDW 20.1* 20.8* 20.6*  PLT 234 252 174   BNPNo results for input(s): BNP, PROBNP in the last 168 hours.  DDimer No results for input(s): DDIMER in the last 168 hours.   Radiology  No results found.  Cardiac Studies  TTE 07/17/2019   1. Left ventricular ejection fraction, by visual estimation, is 50 to 55%. The left ventricle has low normal function. There is mildly increased left ventricular hypertrophy.   2. Moderate hypokinesis of the left ventricular, entire septal wall.  3. The left ventricle demonstrates regional wall motion abnormalities.  4. Global right ventricle has normal systolic function.The right ventricular size is normal. No increase in right ventricular wall thickness.  5. Left atrial size was normal.  6. Right atrial size was mildly dilated.  7. Moderate pleural effusion in the left lateral region.  8. The pericardial effusion is circumferential.  9. Trivial pericardial effusion is present.  10. The mitral valve is abnormal. Mild mitral valve regurgitation. 11. The tricuspid valve is grossly normal. Tricuspid valve regurgitation moderate. 12. Aortic valve regurgitation is moderate. 13. The aortic valve is tricuspid. Aortic valve regurgitation is moderate. Mild aortic valve sclerosis without stenosis. 14. The pulmonic valve was grossly normal. Pulmonic valve regurgitation is not visualized. 15. Moderately elevated pulmonary artery systolic pressure. 16. The inferior vena cava is dilated in size with >50% respiratory variability, suggesting right atrial pressure of 8 mmHg.  Patient Profile   69 y.o. female with PMH of HTN, OA, pericardial effusion, anasarca 2/2 liver disease, and moderate AI who was sent to the hospital by Dr. Audie Box on 07/03/2019 for worsening dyspnea and atrial tachycardia with HR 130s. CTA negative PE, but showed moderate pericardial effusion and airspace disease concerning for infection with superimposed edema. Bedside echo demonstrated large pericardial effusion. She underwent IV diuresis, renal function subsequently worsened. PYP scan negative for amyloid. Right heart cath performed on 12/17 showed mild PAH, CO 5.0, CI 3.2. 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 Echo 12/20 showed EF 45-50%, no temponade, moderate to severe AI, enlarging pericardial effusion. She underwent pericardial window by Dr.  Wyvonnia Lora 07/12/2019. Repeat echo 12/28 EF 50-55%, mid MR, moderate AI. Worsening renal function (Cr 0.67 on arrival, 5.1 by 12/29) resulted in initiation of dialysis on 12/29. Tunneled HD catheter placed 12/29. G tube placed. Thoracentesis performed on 1/12 due to worsening dyspnea, 1100 ml light amber pleural fluid removed. Started on steroid by palliative care to stimulate appetite and reduce inflammatory pain and possibly undiagnosed rheumatological disorder. She had an episode of unresponsiveness on 1/14 after dialysis and second dose of ativan, this quickly resolved and felt to be overmedication.  Course has been complicated by paroxysmal atrial fibrillation.  She is now been anticoagulated due to perceived high bleeding risk.  Assessment & Plan   1.  Paroxysmal atrial fibrillation -Remains in normal sinus rhythm.  Just has sinus rhythm with frequent PACs.   -No recurrence on diltiazem 120 mg extended release tablet daily.  We will continue this as there was concern for worsening of her Raynaud's. -She really has had paroxysmal episodes and I have not seen any recurrence.  We will hold anticoagulation this time.  She is high bleeding risk due to ongoing frailty as well as ESRD.  2.  Anasarca/pericardial effusion -Extensive work-up here.  Her right and left heart catheterization were consistent with tamponade.  Her PYP scan was negative for amyloid.  Her most recent echo shows low normal ejection fraction 50 to 55%.  She has moderate aortic insufficiency.  She is status post pericardial window. -Ultimately, it appears she has been diagnosed with scleroderma.  She has a positive anti-RNA-polymerase 3 antibody.  Hematology is concerned for some underlying malignancy that is possibly causing scleroderma secondary picture.  She is on empiric steroids.   For now, will defer management to hospital medicine team.    3.  AKI now on hemodialysis -Volume is managed by nephrology.  She is set up for  outpatient hemodialysis.  4.  Moderate aortic insufficiency -No need for intervention at this time  For questions or updates, please contact [place] Please consult [email or web address] for contact info under   Signed, [place] T. Audie Box, [place]  08/16/2019 8:35 AM

## 2019-08-16 NOTE — Plan of Care (Signed)
  Problem: Health Behavior/Discharge Planning: Goal: Ability to manage health-related needs will improve Outcome: Progressing   Problem: Clinical Measurements: Goal: Ability to maintain clinical measurements within normal limits will improve Outcome: Progressing Goal: Will remain free from infection Outcome: Progressing Goal: Respiratory complications will improve Outcome: Progressing Goal: Cardiovascular complication will be avoided Outcome: Progressing

## 2019-08-16 NOTE — Progress Notes (Signed)
PROGRESS NOTE  Joanna Ali ZOX:[id number] DOB: 1950-08-20 DOA: 07/03/2019 PCP: Renaldo Reel, DO   LOS: 43 days   Brief narrative: As per HPI,  69 year old female with a history of hypertension, chronic pericardial effusion, moderate aortic valve regurgitation, former smoker, alcohol abuse initially admitted on 07/06/2019 for progressive shortness of breath with anasarca and fatigue.  She was previously worked up by [facility] rheumatology with negative work-up including ESR, lupus anticoagulant, rheumatoid factor, TSH, ANCA, ANA, light free chain studies.  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  Since that time, patient had stopped drinking alcohol as advised.  She has been on diuretics and spironolactone without any improvement in symptoms.    CT of the chest on 07/04/2019 noted moderate pericardial effusion and airspace disease.  She underwent thoracentesis by PCCM which removed 700 mL of the fluid.  Patient underwent right heart cath on 07/06/2019 and pericardial window on 07/12/2019.  Attempts were made to diurese with IV diuretics but patient with worsening kidney function with creatinine up to 5.  Nephrology was formally consulted patient was started on dialysis.  She has been having poor p.o. intake so cortrak tube was placed.  She was then later placed on PEG tube for persistent poor p.o. intake, also patient developed oral pain after NG tube placement.  She is on hemodialysis for AKI with rapid progression to end-stage renal disease, severe anasarca, pericardial effusion, status post pericardial window.    Her labs showed RF positive at 71, INT RNA polymerase antibody +146 concern for scleroderma also she had some thickening of the skin with renal failure and pericardial involvement.  Pulmonary was consulted to evaluate for ILD. On  discussion with rheumatology at [facility] who recommended supportive care, acid reflux treated with PPI, joint pain Voltaren gel and Tylenol.  Prednisone taper.  For Raynaud's phenomenon calcium channel blocker.  If patient has interstitial lung disease then she should be evaluated for immunotherapy can be as outpatient when stronger.  Rheumatology did not recommend immunotherapy for just joint pain and skin manifestation.  Dr. Kathlene November, rheumatologist was consulted on phone.  He recommended to start high-dose IV Solu-Medrol 125 mg daily for 3 days and then prednisone 60 mg daily for week then prednisone 50 mg daily for a week then prednisone 40 mg daily for a week then prednisone 30 mg daily for a week which can be continued till seen by rheumatologist as outpatient.  Assessment/Plan:  Principal Problem:   Anasarca Active Problems:   Pericardial effusion   Hypertension   Elevated troponin   Atypical pneumonia   Hypoxia   Protein-calorie malnutrition, severe   Pressure injury of skin   Failure to thrive in adult   Goals of care, counseling/discussion   Dysphagia   Muscular weakness   Thrush of mouth and esophagus (HCC)   ESRD on hemodialysis (HCC)   Dyspnea   S/P thoracentesis   Atrial fibrillation with RVR (HCC)   AKI (acute kidney injury) ([place])   Scleroderma progressive (HCC)   Rheumatoid arthritis (HCC)   CREST syndrome (HCC)  Scleroderma /anasarca/joint pain/pericardial effusion-patient presented with anasarca, failure to thrive, joint pains, pericardial effusion, Raynaud's phenomenon.  She had positive anti-RNA polymerase 3 antibody.    Rheumatology evaluation in September 2020 showed CRP 13, ESR 25, ANA negative, c-ANCA/p-ANCA negative, scleroderma SCL 70 -, SSS (ro) negative, M spike negative electrophoresis,  HIV negative.  Double-stranded DNA negative, ANA negative, RF 71.   Rheumatology at [facility] recommended quick taper as we were considering scleroderma.  Dr. Hilma Favors  discussed with rheumatologist Dr. Kathlene November, who recommended high-dose steroids, patient started on Solu-Medrol 125 mg IV for 3 days then switch to  prednisone 60 mg daily for 2 weeks and then slow taper.  She has completed IV Solu-Medrol 125 mg for 3 days.   Dr. Kathlene November, recommends to start 60 mg prednisone daily for a week then wean down to 50 mg daily for a week then 40 mg daily for a week then 30 mg daily.  Patient can then continue with prednisone 30 mg daily and follow-up with rheumatology as outpatient.  Started prednisone 60-milligram daily for 1 week from 08/14/2019.  Raynaud's phenomenon/crest syndrome-patient has cold hands with peripheral cyanosis, likely due to underlying scleroderma with worsening due to beta-blocker.  Beta-blocker has been discontinued and switched to Cardizem. Vascular surgery has been consulted for this- impression is Raynaud's phenomena and no further intervention.  Atrial fibrillation RVR-controlled.  Patient was initially on beta-blocker.  Patient is not a good candidate for anticoagulation considering multiple comorbid conditions, pericardial window, ESRD and frailty..    Due to worsening Raynaud's phenomena, beta-blockers were changed to calcium channel blocker.  Currently normal sinus rhythm.    Leukopenia. improved. Oncology was consulted and does not recommend  search for malignancy.  CT chest  was negative, CT abdomen/pelvis on 1/16 was unremarkable , repeat SPEP showed no monoclonal protein with normal kappa lambda ratio.  She also had negative pleural fluid cytology x4.  Screening malignancy can be done as outpatient.  Most recent WBC count of 8.2.  No fever  Acute kidney injury now on hemodialysis due to progressive deterioration of renal function and anuria.  Nephrology on board.  She was started on hemodialysis with improvement of anasarca.  Outpatient dialysis has been set up.  Continue hemodialysis Tuesday Thursday and Saturday.  Lethargy, metabolic  encephalopathy-resolved after hemodialysis and cutting down on narcotics. Ammonia level 29, she received IV thiamine 250 mg 3 times daily for 3 days as well.  Acute hypoxic respiratory failure/pulmonary edema/pleural effusion-resolved at this time. on room air currently. Underwent repeat thoracentesis on 08/01/2019.  Removed 1.1 L from left and 500 mL from right.  Pleural cytology showed reactive mesothelial cells.  Pleural fluid culture showed no growth.   Pericardial effusion with elevated troponin- Patient is status post pericardial window on 07/12/2019.  Pathology showed no malignancy, chronic inflammation, AFB negative.  Fungal culture  negative.  Atypical pneumonia with hypoxia-completed antibiotic treatment.  Severe malnutrition/failure to thrive- seen by speech therapy and has been started on regular diet.  Similarly has tube feeding.  Dysphagia/thrush-group A strep was negative on 07/28/2019, patient currently on oral nystatin, oral Diflucan.  Anxiety-continue Zoloft, Xanax 0.25 mg 3 times daily as needed for panic attack.    Back pain-continue with oxycodone as needed, Neurontin, Voltaren gel twice a day.  Hypertension-continue Cardizem  Sacral decubitus ulcer stage II present on admission.  Continue wound care.  VTE Prophylaxis: Heparin subq  Code Status: Full code  Family Communication: None today.  Disposition Plan: Likely to skilled nursing facility.  Continue physical therapy occupational therapy  Consultants:  Oncology  Nephrology  Palliative care  Procedures:  Hemodialysis  Thoracocentesis   right heart cath on 07/06/2019    pericardial window on 07/12/2019.  PEG tube placement  Antibiotics:  None currently  Anti-infectives (From admission, onward)  Start     Dose/Rate Route Frequency Ordered Stop   07/29/19 1500  fluconazole (DIFLUCAN) tablet 200 mg     200 mg Oral Daily 07/29/19 1458 08/11/19 2116   07/28/19 1300  ceFAZolin (ANCEF) 2-4  GM/100ML-% IVPB    Note to Pharmacy: Kandy Garrison   : cabinet override      07/28/19 1300 07/28/19 1806   07/28/19 0700  ceFAZolin (ANCEF) IVPB 2g/100 mL premix     2 g 200 mL/hr over 30 Minutes Intravenous On call 07/27/19 1502 07/28/19 1340   07/18/19 1316  ceFAZolin (ANCEF) 2-4 GM/100ML-% IVPB    Note to Pharmacy: Manuela Neptune   : cabinet override      07/18/19 1316 07/18/19 1318   07/12/19 2115  ceFAZolin (ANCEF) IVPB 1 g/50 mL premix     1 g 100 mL/hr over 30 Minutes Intravenous Every 8 hours 07/12/19 2102 07/13/19 0516   07/03/19 2245  cefTRIAXone (ROCEPHIN) 2 g in sodium chloride 0.9 % 100 mL IVPB  Status:  Discontinued     2 g 200 mL/hr over 30 Minutes Intravenous Every 24 hours 07/03/19 2230 07/04/19 1336   07/03/19 2245  azithromycin (ZITHROMAX) 500 mg in sodium chloride 0.9 % 250 mL IVPB  Status:  Discontinued     500 mg 250 mL/hr over 60 Minutes Intravenous Every 24 hours 07/03/19 2230 07/05/19 1037      Subjective: The, patient denies any chest pain, shortness of breath or fever.  Seen during physical therapy.  Tolerating PEG tube feeding.  Objective: Vitals:   08/15/19 2338 08/16/19 0331  BP: 134/84 (!) 151/82  Pulse:  98  Resp: 12 18  Temp: 97.6 F (36.4 C) 97.6 F (36.4 C)  SpO2: 100% 100%    Intake/Output Summary (Last 24 hours) at 08/16/2019 0705 Last data filed at 08/16/2019 0600 Gross per 24 hour  Intake 111 ml  Output 1900 ml  Net -1789 ml   Filed Weights   08/15/19 0257 08/15/19 0704 08/15/19 1112  Weight: 37.6 kg 42.2 kg 40.8 kg   Body mass index is 14.53 kg/m.   Physical Exam:  GENERAL: Patient is alert awake and communicative, thinly built,not in obvious distress. HENT: No scleral pallor or icterus. Pupils equally reactive to light. Oral mucosa is moist.  Left internal jugular hemodialysis catheter in place NECK: is supple, no palpable thyroid enlargement. CHEST: Clear to auscultation. No crackles or wheezes. Non tender on  palpation. Diminished breath sounds bilaterally. CVS: S1 and S2 heard, no murmur. Regular rate and rhythm. No pericardial rub. ABDOMEN: Soft, non-tender, bowel sounds are present.  PEG tube in place EXTREMITIES: Cold hands, cyanosis of the digits noted CNS: Cranial nerves are intact. No focal motor or sensory deficits.  Denies weakness noted SKIN: warm and dry without rashes.  Cyanotic extremities.  Taut skin over the lower extremities.  Data Review: I have personally reviewed the following laboratory data and studies,  CBC: Recent Labs  Lab 08/09/19 0722 08/10/19 0626 08/11/19 0234 08/12/19 0441 08/15/19 0251  WBC 1.0* 1.5* 1.6* 4.2 8.2  NEUTROABS  --  0.6*  --   --   --   HGB 10.5* 12.1 11.5* 12.4 12.9  HCT 34.8* 41.7 39.8 42.9 43.9  MCV 96.1 96.5 96.8 94.9 94.0  PLT 301 251 234 252 778   Basic Metabolic Panel: Recent Labs  Lab 08/12/19 0441 08/13/19 0734 08/14/19 0603 08/15/19 0251 08/16/19 0257  NA 133* 133* 137 135 134*  K 4.5 5.6* 4.4  5.2* 4.3  CL 96* 92* 95* 93* 94*  CO2 20* 21* 22 22 26   GLUCOSE 148* 130* 45* 89 98  BUN 79* 153* 82* 128* 56*  CREATININE 2.71* 3.78* 2.49* 3.50* 2.16*  CALCIUM 9.3 9.7 9.2 9.5 9.1  PHOS 4.6 5.7* 4.1 4.9* 4.7*   Liver Function Tests: Recent Labs  Lab 08/12/19 0441 08/13/19 0734 08/14/19 0603 08/15/19 0251 08/16/19 0257  ALBUMIN 3.1* 3.2* 3.1* 3.0* 3.0*   No results for input(s): LIPASE, AMYLASE in the last 168 hours. No results for input(s): AMMONIA in the last 168 hours. Cardiac Enzymes: No results for input(s): CKTOTAL, CKMB, CKMBINDEX, TROPONINI in the last 168 hours. BNP (last 3 results) Recent Labs    04/04/19 2000 07/03/19 1930 07/09/19 0347  BNP 76.7 1,124.1* 4,004.9*    ProBNP (last 3 results) No results for input(s): PROBNP in the last 8760 hours.  CBG: Recent Labs  Lab 08/15/19 1620 08/15/19 2012 08/15/19 2336 08/16/19 0328 08/16/19 0556  GLUCAP 99 111* 69* 50* 65*   No results found for this  or any previous visit (from the past 240 hour(s)).   Studies: No results found.  Scheduled Meds: . acetaminophen  500 mg Oral BID  . bisacodyl  10 mg Rectal Daily  . Chlorhexidine Gluconate Cloth  6 each Topical Q0600  . collagenase   Topical BID  . [START ON 08/19/2019] darbepoetin (ARANESP) injection - DIALYSIS  100 mcg Intravenous Q Sat-HD  . diclofenac Sodium  2 g Topical BID  . diltiazem  120 mg Oral Daily  . famotidine  10 mg Oral Daily  . feeding supplement (PRO-STAT SUGAR FREE 64)  30 mL Per Tube Daily  . gabapentin  100 mg Per Tube Q12H  . Gerhardt's butt cream   Topical TID  . heparin  5,000 Units Subcutaneous Q8H  . loratadine  10 mg Oral Daily  . mouth rinse  15 mL Mouth Rinse BID  . multivitamin  1 tablet Oral QHS  . neomycin-bacitracin-polymyxin   Topical BID  . nystatin  5 mL Mouth/Throat QID  . pantoprazole sodium  40 mg Per Tube BID  . predniSONE  60 mg Oral Q breakfast   Followed by  . [START ON 08/22/2019] predniSONE  50 mg Oral Q breakfast   Followed by  . [START ON 09/17/2019] predniSONE  40 mg Oral Q breakfast   Followed by  . [START ON 09/06/2019] predniSONE  30 mg Oral Q breakfast  . sertraline  25 mg Oral BID  . sodium chloride flush  3 mL Intravenous Q12H  . sodium chloride irrigation  15 mL Irrigation QID  . thiamine  100 mg Oral Daily    Continuous Infusions: . sodium chloride    . sodium chloride    . sodium chloride    . sodium chloride    . sodium chloride    . feeding supplement (NEPRO CARB STEADY) 1,000 mL (08/15/19 0610)  . ondansetron (ZOFRAN) IV Stopped (08/15/19 1232)    Flora Lipps, MD  [facility] 08/16/2019

## 2019-08-17 LAB — CBC
HCT: 45.2 % (ref 36.0–46.0)
Hemoglobin: 13.3 g/dL (ref 12.0–15.0)
MCH: 27.4 pg (ref 26.0–34.0)
MCHC: 29.4 g/dL — ABNORMAL LOW (ref 30.0–36.0)
MCV: 93 fL (ref 80.0–100.0)
Platelets: 119 10*3/uL — ABNORMAL LOW (ref 150–400)
RBC: 4.86 MIL/uL (ref 3.87–5.11)
RDW: 19.9 % — ABNORMAL HIGH (ref 11.5–15.5)
WBC: 6.4 10*3/uL (ref 4.0–10.5)
nRBC: 0 % (ref 0.0–0.2)

## 2019-08-17 LAB — RENAL FUNCTION PANEL
Albumin: 3.1 g/dL — ABNORMAL LOW (ref 3.5–5.0)
Anion gap: 18 — ABNORMAL HIGH (ref 5–15)
BUN: 98 mg/dL — ABNORMAL HIGH (ref 8–23)
CO2: 22 mmol/L (ref 22–32)
Calcium: 9.5 mg/dL (ref 8.9–10.3)
Chloride: 92 mmol/L — ABNORMAL LOW (ref 98–111)
Creatinine, Ser: 3.23 mg/dL — ABNORMAL HIGH (ref 0.44–1.00)
GFR calc Af Amer: 16 mL/min — ABNORMAL LOW (ref >60)
GFR calc non Af Amer: 14 mL/min — ABNORMAL LOW (ref >60)
Glucose, Bld: 97 mg/dL (ref 70–99)
Phosphorus: 6.8 mg/dL — ABNORMAL HIGH (ref 2.5–4.6)
Potassium: 4.5 mmol/L (ref 3.5–5.1)
Sodium: 132 mmol/L — ABNORMAL LOW (ref 135–145)

## 2019-08-17 LAB — GLUCOSE, CAPILLARY
Glucose-Capillary: 101 mg/dL — ABNORMAL HIGH (ref 70–99)
Glucose-Capillary: 90 mg/dL (ref 70–99)
Glucose-Capillary: 105 mg/dL — ABNORMAL HIGH (ref 70–99)
Glucose-Capillary: 147 mg/dL — ABNORMAL HIGH (ref 70–99)
Glucose-Capillary: 163 mg/dL — ABNORMAL HIGH (ref 70–99)
Glucose-Capillary: 118 mg/dL — ABNORMAL HIGH (ref 70–99)

## 2019-08-17 MED ORDER — ONDANSETRON HCL 4 MG/2ML IJ SOLN
4.0000 mg | Freq: Four times a day (QID) | INTRAMUSCULAR | Status: DC | PRN
  Administered 2019-08-22 – 2019-08-23 (×2): 4 mg via INTRAVENOUS
  Filled 2019-08-17 (×2): qty 2

## 2019-08-17 MED ORDER — HEPARIN SODIUM (PORCINE) 1000 UNIT/ML IJ SOLN
INTRAMUSCULAR | Status: AC
  Administered 2019-08-17: 3800 [IU] via INTRAVENOUS_CENTRAL
  Filled 2019-08-17: qty 4

## 2019-08-17 MED ORDER — FERRIC CITRATE 1 GM 210 MG(FE) PO TABS
210.0000 mg | ORAL_TABLET | Freq: Three times a day (TID) | ORAL | Status: DC
  Administered 2019-08-17 – 2019-08-18 (×5): 210 mg via ORAL
  Filled 2019-08-17 (×7): qty 1

## 2019-08-17 NOTE — Progress Notes (Signed)
PT Cancellation Note  Patient Details Name: Joanna Ali MRN: [id number] DOB: [birth year]   Cancelled Treatment:    Reason Eval/Treat Not Completed: Patient at procedure or test/unavailable PT will continue to follow acutely.    Earney Navy, PTA Acute Rehabilitation Services Pager: [phone] Office: [phone]   08/17/2019, 9:41 AM

## 2019-08-17 NOTE — Progress Notes (Addendum)
Pt returned to room 2c01 from HD with N/V. VSS BP 131/83, 100% on RA, HR 117, RR 31. Will monitor.

## 2019-08-17 NOTE — Plan of Care (Signed)
  Problem: Clinical Measurements: Goal: Ability to maintain clinical measurements within normal limits will improve Outcome: Progressing Goal: Diagnostic test results will improve Outcome: Progressing Goal: Respiratory complications will improve Outcome: Progressing Goal: Cardiovascular complication will be avoided Outcome: Progressing   Problem: Activity: Goal: Risk for activity intolerance will decrease Outcome: Progressing

## 2019-08-17 NOTE — Progress Notes (Signed)
Inpatient Rehab Admissions:  Inpatient Rehab Consult received. Will f/u with pt tomorrow.   Signed: Shann Medal, PT, DPT Admissions Coordinator [phone] 08/17/19  3:31 PM

## 2019-08-17 NOTE — Progress Notes (Signed)
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 Kipp Brood 07/12/2019.  Initial serological evaluation for autoimmune diseases was essentially negative, although she did have a positive anti-RNA polymerase 3 antibody.  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  Hematology raised the concern of a malignancy or scleroderma although an extensive search for malignancy was not recommended by Dr. Jana Hakim.  Initial plans were for transfer to tertiary center however this did not occur due to lack of bed availability.  She was initially started on dialysis 07/18/2019 due to edema that failed to resolve with IV diuretics and worsening serum creatinine.  She dialyzes with a tunneled dialysis catheter.  Her last dialysis 08/14/2018 with removal of 1.9 L.  Next dialysis treatment 08/17/2019  Blood pressure 132/90 pulse 99 temperature 97.6 O2 sats 100% room air  Neurontin 100 mg twice daily Claritin 10 mg daily, Lopressor 25 mg twice daily, Protonix 40 mg twice daily, Zoloft 25 mg daily..  Prednisone taper starting at 60 mg.  08/15/2019.  Cardizem 120 mg daily  We will discontinue darbepoetin due to high hemoglobin  Objective:  Vital signs in last 24 hours:  Temp:  [97.5 F (36.4 C)-98.2 F (36.8 C)] 97.6 F (36.4 C) (01/28 0759) Pulse Rate:  [76-101] 99 (01/28 0830) Resp:  [10-151] 22 (01/28 0759) BP: (98-156)/(68-102) 132/90 (01/28 0830) SpO2:  [94 %-100 %] 100 % (01/28 0759) Weight:  [41.3 kg] 41.3 kg (01/28 0759)  Weight change:  Filed Weights   08/15/19 0704 08/15/19 1112 08/17/19 0759  Weight: 42.2 kg 40.8 kg 41.3 kg     Intake/Output: I/O last 3 completed shifts: In: 108 [IV Piggyback:108] Out: -    Intake/Output this shift:  No intake/output data recorded. Cachectic and frail CVS- RRR JVP not elevated left tunneled dialysis catheter RS- CTA no wheezes or rales ABD- BS present soft non-distended EXT- no edema   Basic Metabolic Panel: Recent Labs  Lab 08/13/19 0734 08/13/19 0734 08/14/19 0603 08/14/19 0603 08/15/19 0251 08/16/19 0257 08/17/19 0303  NA 133*  --  137  --  135 134* 132*  K 5.6*  --  4.4  --  5.2* 4.3 4.5  CL 92*  --  95*  --  93* 94* 92*  CO2 21*  --  22  --  22 26 22   GLUCOSE 130*  --  45*  --  89 98 97  BUN 153*  --  82*  --  128* 56* 98*  CREATININE 3.78*  --  2.49*  --  3.50* 2.16* 3.23*  CALCIUM 9.7   < > 9.2   < > 9.5 9.1 9.5  PHOS 5.7*  --  4.1  --  4.9* 4.7* 6.8*   < > = values in this interval not displayed.    Liver Function Tests: Recent Labs  Lab 08/13/19 0734 08/14/19 0603 08/15/19 0251 08/16/19 0257 08/17/19 0303  ALBUMIN 3.2* 3.1* 3.0* 3.0* 3.1*   No results for input(s): LIPASE, AMYLASE in the last 168 hours. No results for input(s): AMMONIA in the last 168 hours.  CBC: Recent Labs  Lab 08/11/19 0234 08/12/19 0441 08/15/19 0251 08/17/19 0303  WBC 1.6* 4.2  8.2 6.4  HGB 11.5* 12.4 12.9 13.3  HCT 39.8 42.9 43.9 45.2  MCV 96.8 94.9 94.0 93.0  PLT 234 252 174 119*    Cardiac Enzymes: No results for input(s): CKTOTAL, CKMB, CKMBINDEX, TROPONINI in the last 168 hours.  BNP: Invalid input(s): POCBNP  CBG: Recent Labs  Lab 08/16/19 1614 08/16/19 1959 08/16/19 2320 08/17/19 0346 08/17/19 0726  GLUCAP 161* 119* 101* 90 105*    Microbiology: Results for orders placed or performed during the hospital encounter of 07/03/19  Culture, blood (routine x 2)     Status: None   Collection Time: 07/03/19  7:28 PM   Specimen: BLOOD RIGHT FOREARM  Result Value Ref Range Status   Specimen Description BLOOD RIGHT FOREARM  Final   Special  Requests   Final    BOTTLES DRAWN AEROBIC AND ANAEROBIC Blood Culture adequate volume   Culture   Final    NO GROWTH 5 DAYS Performed at [facility], [place] [address] [place], [place] [address]    Report Status 07/08/2019 FINAL  Final  Culture, blood (routine x 2)     Status: None   Collection Time: 07/03/19  7:34 PM   Specimen: BLOOD LEFT HAND  Result Value Ref Range Status   Specimen Description BLOOD LEFT HAND  Final   Special Requests AEROBIC BOTTLE ONLY Blood Culture adequate volume  Final   Culture   Final    NO GROWTH 5 DAYS Performed at [facility], [place] [address], [place], [place] [address]    Report Status 07/08/2019 FINAL  Final  SARS CORONAVIRUS 2 (TAT 6-24 HRS) Nasopharyngeal Nasopharyngeal Swab     Status: None   Collection Time: 07/03/19 10:11 PM   Specimen: Nasopharyngeal Swab  Result Value Ref Range Status   SARS Coronavirus 2 NEGATIVE NEGATIVE Final    Comment: (NOTE) SARS-CoV-2 target nucleic acids are NOT DETECTED. The SARS-CoV-2 RNA is generally detectable in upper and lower respiratory specimens during the acute phase of infection. Negative results do not preclude SARS-CoV-2 infection, do not rule out co-infections with other pathogens, and should not be used as the sole basis for treatment or other patient management decisions. Negative results must be combined with clinical observations, patient history, and epidemiological information. The expected result is Negative. Fact Sheet for Patients: SugarRoll.be Fact Sheet for Healthcare Providers: https://www.woods-mathews.com/ This test is not yet approved or cleared by the [place] FDA and  has been authorized for detection and/or diagnosis of SARS-CoV-2 by FDA under an Emergency Use Authorization (EUA). This EUA will remain  in effect (meaning this test can be used) for the duration of the COVID-19 declaration under Section 56 4(b)(1) of the Act,  21 U.S.C. section 360bbb-3(b)(1), unless the authorization is terminated or revoked sooner. Performed at [facility], [place] [address], [place], [place] [address]   Respiratory Panel by PCR     Status: None   Collection Time: 07/03/19 10:11 PM   Specimen: Nasopharyngeal Swab; Respiratory  Result Value Ref Range Status   Adenovirus NOT DETECTED NOT DETECTED Final   Coronavirus 229E NOT DETECTED NOT DETECTED Final    Comment: (NOTE) The Coronavirus on the Respiratory Panel, DOES NOT test for the novel  Coronavirus (2019 nCoV)    Coronavirus HKU1 NOT DETECTED NOT DETECTED Final   Coronavirus NL63 NOT DETECTED NOT DETECTED Final   Coronavirus OC43 NOT DETECTED NOT DETECTED Final   Metapneumovirus NOT DETECTED NOT DETECTED Final   Rhinovirus / Enterovirus  NOT DETECTED NOT DETECTED Final   Influenza A NOT DETECTED NOT DETECTED Final   Influenza B NOT DETECTED NOT DETECTED Final   Parainfluenza Virus 1 NOT DETECTED NOT DETECTED Final   Parainfluenza Virus 2 NOT DETECTED NOT DETECTED Final   Parainfluenza Virus 3 NOT DETECTED NOT DETECTED Final   Parainfluenza Virus 4 NOT DETECTED NOT DETECTED Final   Respiratory Syncytial Virus NOT DETECTED NOT DETECTED Final   Bordetella pertussis NOT DETECTED NOT DETECTED Final   Chlamydophila pneumoniae NOT DETECTED NOT DETECTED Final   Mycoplasma pneumoniae NOT DETECTED NOT DETECTED Final    Comment: Performed at [facility], [place] [address], [place], [place] [address]  Respiratory Panel by RT PCR (Flu A&B, Covid) - Nasopharyngeal Swab     Status: None   Collection Time: 07/04/19  1:11 AM   Specimen: Nasopharyngeal Swab  Result Value Ref Range Status   SARS Coronavirus 2 by RT PCR NEGATIVE NEGATIVE Final    Comment: (NOTE) SARS-CoV-2 target nucleic acids are NOT DETECTED. The SARS-CoV-2 RNA is generally detectable in upper respiratoy specimens during the acute phase of infection. The lowest concentration of SARS-CoV-2 viral copies  this assay can detect is 131 copies/mL. A negative result does not preclude SARS-Cov-2 infection and should not be used as the sole basis for treatment or other patient management decisions. A negative result may occur with  improper specimen collection/handling, submission of specimen other than nasopharyngeal swab, presence of viral mutation(s) within the areas targeted by this assay, and inadequate number of viral copies (<131 copies/mL). A negative result must be combined with clinical observations, patient history, and epidemiological information. The expected result is Negative. Fact Sheet for Patients:  PinkCheek.be Fact Sheet for Healthcare Providers:  GravelBags.it This test is not yet ap proved or cleared by the [place] FDA and  has been authorized for detection and/or diagnosis of SARS-CoV-2 by FDA under an Emergency Use Authorization (EUA). This EUA will remain  in effect (meaning this test can be used) for the duration of the COVID-19 declaration under Section 564(b)(1) of the Act, 21 U.S.C. section 360bbb-3(b)(1), unless the authorization is terminated or revoked sooner.    Influenza A by PCR NEGATIVE NEGATIVE Final   Influenza B by PCR NEGATIVE NEGATIVE Final    Comment: (NOTE) The Xpert Xpress SARS-CoV-2/FLU/RSV assay is intended as an aid in  the diagnosis of influenza from Nasopharyngeal swab specimens and  should not be used as a sole basis for treatment. Nasal washings and  aspirates are unacceptable for Xpert Xpress SARS-CoV-2/FLU/RSV  testing. Fact Sheet for Patients: PinkCheek.be Fact Sheet for Healthcare Providers: GravelBags.it This test is not yet approved or cleared by the [place] FDA and  has been authorized for detection and/or diagnosis of SARS-CoV-2 by  FDA under an Emergency Use Authorization (EUA). This EUA will remain  in  effect (meaning this test can be used) for the duration of the  Covid-19 declaration under Section 564(b)(1) of the Act, 21  U.S.C. section 360bbb-3(b)(1), unless the authorization is  terminated or revoked. Performed at [facility], [place] [address], [place], [place] [address]   MRSA PCR Screening     Status: None   Collection Time: 07/04/19  7:00 AM   Specimen: Nasopharyngeal  Result Value Ref Range Status   MRSA by PCR NEGATIVE NEGATIVE Final    Comment:        The GeneXpert MRSA Assay (FDA approved for NASAL specimens only), is one component of  a comprehensive MRSA colonization surveillance program. It is not intended to diagnose MRSA infection nor to guide or monitor treatment for MRSA infections. Performed at [facility], [place] [address], [place], [place] [address]   Body fluid culture     Status: None   Collection Time: 07/04/19  1:23 PM   Specimen: Pleura; Body Fluid  Result Value Ref Range Status   Specimen Description PLEURAL RIGHT  Final   Special Requests NONE  Final   Gram Stain   Final    RARE WBC PRESENT, PREDOMINANTLY MONONUCLEAR NO ORGANISMS SEEN    Culture   Final    NO GROWTH Performed at [facility], [address] [place], [place] [address]    Report Status 07/07/2019 FINAL  Final  Body fluid culture (includes gram stain)     Status: None   Collection Time: 07/04/19  1:23 PM   Specimen: Pleural Fluid  Result Value Ref Range Status   Specimen Description PLEURAL LEFT  Final   Special Requests NONE  Final   Gram Stain   Final    RARE WBC PRESENT, PREDOMINANTLY MONONUCLEAR NO ORGANISMS SEEN    Culture   Final    NO GROWTH Performed at [facility], [place] [address] [place], [place] [address]    Report Status 07/07/2019 FINAL  Final  Surgical pcr screen     Status: None   Collection Time: 07/12/19  3:31 PM   Specimen: Nasal Mucosa; Nasal Swab  Result Value Ref Range Status   MRSA, PCR NEGATIVE NEGATIVE Final    Staphylococcus aureus NEGATIVE NEGATIVE Final    Comment: (NOTE) The Xpert SA Assay (FDA approved for NASAL specimens in patients 75 years of age and older), is one component of a comprehensive surveillance program. It is not intended to diagnose infection nor to guide or monitor treatment. Performed at [facility], [place] [address], [place], [place] [address]   Fungus Culture With Stain     Status: None   Collection Time: 07/12/19  5:45 PM   Specimen: PATH Cytology Misc. fluid; Body Fluid  Result Value Ref Range Status   Fungus Stain Final report  Final   Fungus (Mycology) Culture Final report  Final    Comment: (NOTE) Performed At: [facility] [address] [place], [place] [id number] Rush Farmer MD VO:[id number]    Fungal Source FLUID  Final    Comment: PERICARDIAL Performed at [facility], [place] [address] [place], [place] [address]   Aerobic/Anaerobic Culture (surgical/deep wound)     Status: None   Collection Time: 07/12/19  5:45 PM   Specimen: PATH Cytology Misc. fluid; Body Fluid  Result Value Ref Range Status   Specimen Description FLUID PERICARDIAL  Final   Special Requests NONE  Final   Gram Stain NO WBC SEEN NO ORGANISMS SEEN   Final   Culture   Final    No growth aerobically or anaerobically. Performed at [facility], [place] [address], [place], [place] [address]    Report Status 07/17/2019 FINAL  Final  Acid Fast Smear (AFB)     Status: None   Collection Time: 07/12/19  5:45 PM   Specimen: PATH Cytology Misc. fluid; Body Fluid  Result Value Ref Range Status   AFB Specimen Processing Concentration  Final   Acid Fast Smear Negative  Final    Comment: (NOTE) Performed At: [facility] [address] [place], [place] [id number] Rush Farmer  MD TO:[id number]    Source (AFB) FLUID  Final    Comment: PERICARDIAL Performed at [facility], [place] [address], [place], [place] [address]   Fungus Culture Result      Status: None   Collection Time: 07/12/19  5:45 PM  Result Value Ref Range Status   Result 1 Comment  Final    Comment: (NOTE) KOH/Calcofluor preparation:  no fungus observed. Performed At: [facility] [place], [place] [id number] Rush Farmer MD AL:[id number]   Fungal organism reflex     Status: None   Collection Time: 07/12/19  5:45 PM  Result Value Ref Range Status   Fungal result 1 Comment  Final    Comment: (NOTE) No yeast or mold isolated after 4 weeks. Performed At: [facility] [place], [place] [id number] Rush Farmer MD AS:[id number]   Culture, group A strep     Status: None   Collection Time: 07/26/19 12:21 PM   Specimen: Throat  Result Value Ref Range Status   Specimen Description THROAT  Final   Special Requests NONE  Final   Culture   Final    NO GROUP A STREP (S.PYOGENES) ISOLATED Performed at [facility], [place] [address] [place], [place] [address]    Report Status 07/28/2019 FINAL  Final    Coagulation Studies: No results for input(s): LABPROT, INR in the last 72 hours.  Urinalysis: No results for input(s): COLORURINE, LABSPEC, PHURINE, GLUCOSEU, HGBUR, BILIRUBINUR, KETONESUR, PROTEINUR, UROBILINOGEN, NITRITE, LEUKOCYTESUR in the last 72 hours.  Invalid input(s): APPERANCEUR    Imaging: No results found.   Medications:   . sodium chloride    . sodium chloride    . sodium chloride    . sodium chloride    . sodium chloride    . feeding supplement (NEPRO CARB STEADY) 1,000 mL (08/15/19 0610)  . ondansetron (ZOFRAN) IV Stopped (08/15/19 1232)   . acetaminophen  500 mg Oral BID  . Chlorhexidine Gluconate Cloth  6 each Topical Q0600  . collagenase   Topical BID  . [START ON 08/19/2019] darbepoetin (ARANESP) injection - DIALYSIS  100 mcg Intravenous Q Sat-HD  . diclofenac Sodium  2 g Topical BID  . diltiazem  120 mg Oral Daily  . famotidine  10 mg Oral Daily  . feeding supplement (PRO-STAT  SUGAR FREE 64)  30 mL Per Tube Daily  . gabapentin  100 mg Per Tube Q12H  . Gerhardt's butt cream   Topical TID  . heparin  5,000 Units Subcutaneous Q8H  . loratadine  10 mg Oral Daily  . mouth rinse  15 mL Mouth Rinse BID  . multivitamin  1 tablet Oral QHS  . neomycin-bacitracin-polymyxin   Topical BID  . nystatin  5 mL Mouth/Throat QID  . pantoprazole sodium  40 mg Per Tube BID  . predniSONE  60 mg Oral Q breakfast   Followed by  . [START ON 08/22/2019] predniSONE  50 mg Oral Q breakfast   Followed by  . [START ON 09/03/2019] predniSONE  40 mg Oral Q breakfast   Followed by  . [START ON 09/06/2019] predniSONE  30 mg Oral Q breakfast  . sertraline  25 mg Oral BID  . sodium chloride flush  3 mL Intravenous Q12H  . sodium chloride irrigation  15 mL Irrigation QID  . thiamine  100 mg Oral Daily   sodium chloride, sodium chloride, sodium chloride, sodium chloride, sodium chloride, acetaminophen (TYLENOL) oral liquid 160 mg/5  mL, ALPRAZolam, alteplase, alum & mag hydroxide-simeth, antiseptic oral rinse, bisacodyl, bisacodyl, camphor-menthol, heparin, hydrOXYzine, ondansetron (ZOFRAN) IV, oxyCODONE, polyethylene glycol, polyvinyl alcohol, senna-docusate, sodium chloride flush, witch hazel-glycerin  Assessment/ Plan:   Acute kidney injury with a creatinine of 1.43 07/03/2019 thought to be hemodynamically mediated possible hepatorenal syndrome with concomitant use of IV contrast urinalysis was unremarkable autoimmune work-up has been unremarkable however there is some consideration of scleroderma.  She was initiated on dialysis due to progressive loss of renal function and she is anuric this was started 07/18/2019.  She no longer has a slot at [facility] for [facility].  She tolerated dialysis 08/15/2019.  Dialysis has been ordered for 08/17/2019  Anemia ESA was held 08/11/2019.  Will discontinue darbepoetin  Malnutrition low albumin  Bones check PTH and start Auryxia 210 mg 1 with meals  Moderate to  large pericardial effusion status post pericardial window 08/11/2018  Liver disease diagnosed in [facility] with history of steatosis.  Scleroderma with crest syndrome.  Initiated on prednisone taper starting at 60 mg daily 08/15/2019  Paroxysmal atrial fibrillation   appreciate assistance from Dr. Davina Poke.  No anticoagulation for now due to high risk of bleeding recent pericardial effusion and window.  Patient awaiting possible transfer to [facility] to [place].  Palliative care following   LOS: [place] @TODAY @8 :44 AM

## 2019-08-17 NOTE — Progress Notes (Signed)
PROGRESS NOTE  Joanna Ali HOZ:[id number] DOB: 1950-10-09 DOA: 07/03/2019 PCP: Renaldo Reel, DO   LOS: 44 days   Brief narrative: As per HPI,  69 year old female with a history of hypertension, chronic pericardial effusion, moderate aortic valve regurgitation, former smoker, alcohol abuse initially admitted on 07/06/2019 for progressive shortness of breath with anasarca and fatigue.  She was previously worked up by [facility] rheumatology with negative work-up including ESR, lupus anticoagulant, rheumatoid factor, TSH, ANCA, ANA, light free chain studies.  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  Since that time, patient had stopped drinking alcohol as advised.  She has been on diuretics and spironolactone without any improvement in symptoms.    CT of the chest on 07/04/2019 noted moderate pericardial effusion and airspace disease.  She underwent thoracentesis by PCCM which removed 700 mL of the fluid.  Patient underwent right heart cath on 07/06/2019 and pericardial window on 07/12/2019.  Attempts were made to diurese with IV diuretics but patient with worsening kidney function with creatinine up to 5.  Nephrology was formally consulted patient was started on dialysis.  She has been having poor p.o. intake so cortrak tube was placed.  She was then later placed on PEG tube for persistent poor p.o. intake, also patient developed oral pain after NG tube placement.  She is on hemodialysis for AKI with rapid progression to end-stage renal disease, severe anasarca, pericardial effusion, status post pericardial window.    Her labs showed RF positive at 71, INT RNA polymerase antibody +146 concern for scleroderma also she had some thickening of the skin with renal failure and pericardial involvement.  Pulmonary was consulted to evaluate for ILD. On  discussion with rheumatology at [facility] who recommended supportive care, acid reflux treated with PPI, joint pain Voltaren gel and Tylenol.  Prednisone taper.  For Raynaud's phenomenon calcium channel blocker.  If patient has interstitial lung disease then she should be evaluated for immunotherapy can be as outpatient when stronger.  Rheumatology did not recommend immunotherapy for just joint pain and skin manifestation.  Dr. Kathlene November, rheumatologist was consulted on phone.  He recommended to start high-dose IV Solu-Medrol 125 mg daily for 3 days and then prednisone 60 mg daily for week then prednisone 50 mg daily for a week then prednisone 40 mg daily for a week then prednisone 30 mg daily for a week which can be continued till seen by rheumatologist as outpatient.  Assessment/Plan:  Principal Problem:   Anasarca Active Problems:   Pericardial effusion   Hypertension   Elevated troponin   Atypical pneumonia   Hypoxia   Protein-calorie malnutrition, severe   Pressure injury of skin   Failure to thrive in adult   Goals of care, counseling/discussion   Dysphagia   Muscular weakness   Thrush of mouth and esophagus (HCC)   ESRD on hemodialysis (HCC)   Dyspnea   S/P thoracentesis   Atrial fibrillation with RVR (HCC)   AKI (acute kidney injury) ([place])   Scleroderma progressive (HCC)   Rheumatoid arthritis (HCC)   CREST syndrome (HCC)  Scleroderma /anasarca/joint pain/pericardial effusion-patient presented with anasarca, failure to thrive, joint pains, pericardial effusion, Raynaud's phenomenon.  She had positive anti-RNA polymerase 3 antibody.    Rheumatology evaluation in September 2020 showed CRP 13, ESR 25, ANA negative, c-ANCA/p-ANCA negative, scleroderma SCL 70 -, SSS (ro) negative, M spike negative electrophoresis,  HIV negative.  Double-stranded DNA negative, ANA negative, RF 71.   Rheumatology at [facility] recommended quick taper as we were considering scleroderma.  Dr. Hilma Favors  discussed with rheumatologist Dr. Kathlene November, who recommended high-dose steroids, patient started on Solu-Medrol 125 mg IV for 3 days then switch to  prednisone 60 mg daily for 2 weeks and then slow taper.  She has completed IV Solu-Medrol 125 mg for 3 days.   Plan is 60 mg prednisone daily for a week then wean down to 50 mg daily for a week then 40 mg daily for a week then 30 mg daily.  Patient can then continue with prednisone 30 mg daily and follow-up with rheumatology as outpatient.  Started prednisone 60-milligram daily for 1 week from 08/14/2019.  Raynaud's phenomenon/crest syndrome-patient has cold hands with peripheral cyanosis, likely due to underlying scleroderma.  Beta-blocker has been discontinued and switched to Cardizem. Vascular surgery has been consulted for this- impression is Raynaud's phenomena and no further intervention.  Atrial fibrillation RVR.  Improved. Patient was initially on beta-blocker.  Patient is not a good candidate for anticoagulation considering multiple comorbid conditions, pericardial window, ESRD and frailty.  Due to worsening Raynaud's phenomena, beta-blockers were changed to calcium channel blocker.    Leukopenia. improved. Oncology was consulted and does not recommend  search for malignancy.  CT chest  was negative, CT abdomen/pelvis on 1/16 was unremarkable , repeat SPEP showed no monoclonal protein with normal kappa lambda ratio.  She also had negative pleural fluid cytology x4.  Screening for other malignancy will be routine.  Most recent WBC count of 6.4.  No fever  Acute kidney injury now on hemodialysis due to progressive deterioration of renal function and anuria.  Nephrology on board.  on hemodialysis with improvement of anasarca.  Outpatient dialysis has been set up.  Continue hemodialysis Tuesday, Thursday and Saturday.  Lethargy, metabolic encephalopathy-improved after hemodialysis and cutting down on narcotics. Ammonia level 29, Patient also received IV  thiamine 250 mg 3 times daily for 3 days.  Acute hypoxic respiratory failure/pulmonary edema/pleural effusion-improved.  at this time. on room air currently. Underwent repeat thoracentesis on 08/01/2019.  Removed 1.1 L from left and 500 mL from right.  Pleural cytology showed reactive mesothelial cells.  Pleural fluid culture showed no growth.   Pericardial effusion with elevated troponin- Patient is status post pericardial window on 07/12/2019.  Pathology showed no malignancy, chronic inflammation, AFB negative.  Fungal culture  negative.  Atypical pneumonia with hypoxia-completed antibiotic treatment.  Currently on room air  Severe malnutrition/failure to thrive- seen by speech therapy and has been started on regular diet.  Has PEG tube feeding.  Dysphagia/thrush-group A strep was negative on 07/28/2019, patient currently on oral nystatin, oral Diflucan.  Anxiety-continue Zoloft, Xanax 0.25 mg 3 times daily as needed for panic attack.    Back pain-continue with oxycodone as needed, Neurontin, Voltaren gel twice a day.  Hypertension-continue Cardizem  Sacral decubitus ulcer stage II present on admission.  Continue wound care.  VTE Prophylaxis: Heparin subq  Code Status: Full code  Family Communication: I spoke with the patient's husband Coral Else on the phone today and updated him about the clinical condition of the patient.  Patient's husband stated that he would like the patient to be reassessed for inpatient rehabilitation.  I have encouraged him to discuss with transition of care regarding further disposition since he had concerns about skilled nursing facility.  Disposition Plan:  Skilled nursing facility as per physical therapy recommendation. Continue  hemodialysis.  Consult inpatient rehab as well  Consultants:  Oncology  Nephrology  Palliative care  Procedures:  Hemodialysis  Thoracocentesis   right heart cath on 07/06/2019    pericardial window on  07/12/2019.  PEG tube placement  Antibiotics: None currently  Anti-infectives (From admission, onward)   Start     Dose/Rate Route Frequency Ordered Stop   07/29/19 1500  fluconazole (DIFLUCAN) tablet 200 mg     200 mg Oral Daily 07/29/19 1458 08/11/19 2116   07/28/19 1300  ceFAZolin (ANCEF) 2-4 GM/100ML-% IVPB    Note to Pharmacy: Kandy Garrison   : cabinet override      07/28/19 1300 07/28/19 1806   07/28/19 0700  ceFAZolin (ANCEF) IVPB 2g/100 mL premix     2 g 200 mL/hr over 30 Minutes Intravenous On call 07/27/19 1502 07/28/19 1340   07/18/19 1316  ceFAZolin (ANCEF) 2-4 GM/100ML-% IVPB    Note to Pharmacy: Manuela Neptune   : cabinet override      07/18/19 1316 07/18/19 1318   07/12/19 2115  ceFAZolin (ANCEF) IVPB 1 g/50 mL premix     1 g 100 mL/hr over 30 Minutes Intravenous Every 8 hours 07/12/19 2102 07/13/19 0516   07/03/19 2245  cefTRIAXone (ROCEPHIN) 2 g in sodium chloride 0.9 % 100 mL IVPB  Status:  Discontinued     2 g 200 mL/hr over 30 Minutes Intravenous Every 24 hours 07/03/19 2230 07/04/19 1336   07/03/19 2245  azithromycin (ZITHROMAX) 500 mg in sodium chloride 0.9 % 250 mL IVPB  Status:  Discontinued     500 mg 250 mL/hr over 60 Minutes Intravenous Every 24 hours 07/03/19 2230 07/05/19 1037     Subjective: Patient was seen and examined at bedside.  Patient complains of nausea and mild abdominal discomfort seen during hemodialysis.  Denies any chest pain or shortness of breath.  Patient's husband states to me that she has been so much better after initiating steroids.  Objective: Vitals:   08/17/19 1130 08/17/19 1200  BP: 102/67 94/68  Pulse: 71 85  Resp:    Temp:    SpO2:     No intake or output data in the 24 hours ending 08/17/19 1236 Filed Weights   08/15/19 0704 08/15/19 1112 08/17/19 0759  Weight: 42.2 kg 40.8 kg 41.3 kg   Body mass index is 14.69 kg/m.   Physical Exam: GENERAL: Patient is alert awake and communicative, thinly built,  appears weak deconditioned and frail with weak voice.  Mildly anxious HENT: No scleral pallor or icterus. Pupils equally reactive to light. Oral mucosa is moist.  Left internal jugular hemodialysis catheter in place NECK: is supple, no palpable thyroid enlargement. CHEST: Clear to auscultation. No crackles or wheezes. Non tender on palpation. Diminished breath sounds bilaterally. CVS: S1 and S2 heard, no murmur. Regular rate and rhythm. No pericardial rub. ABDOMEN: Soft, non-tender, bowel sounds are present.  PEG tube in place EXTREMITIES: Cold hands, cyanosis of the digits noted CNS: Cranial nerves are intact. No focal motor or sensory deficits.  Denies weakness noted SKIN: warm and dry without rashes.  Cyanotic extremities.  Taut skin over the lower extremities.  Data Review: I have personally reviewed the following laboratory data and studies,  CBC: Recent Labs  Lab 08/11/19 0234 08/12/19 0441 08/15/19 0251 08/17/19 0303  WBC 1.6* 4.2 8.2 6.4  HGB 11.5* 12.4 12.9 13.3  HCT 39.8 42.9 43.9 45.2  MCV 96.8 94.9 94.0 93.0  PLT 234 252 174 119*  Basic Metabolic Panel: Recent Labs  Lab 08/13/19 0734 08/14/19 0603 08/15/19 0251 08/16/19 0257 08/17/19 0303  NA 133* 137 135 134* 132*  K 5.6* 4.4 5.2* 4.3 4.5  CL 92* 95* 93* 94* 92*  CO2 21* 22 22 26 22   GLUCOSE 130* 45* 89 98 97  BUN 153* 82* 128* 56* 98*  CREATININE 3.78* 2.49* 3.50* 2.16* 3.23*  CALCIUM 9.7 9.2 9.5 9.1 9.5  PHOS 5.7* 4.1 4.9* 4.7* 6.8*   Liver Function Tests: Recent Labs  Lab 08/13/19 0734 08/14/19 0603 08/15/19 0251 08/16/19 0257 08/17/19 0303  ALBUMIN 3.2* 3.1* 3.0* 3.0* 3.1*   No results for input(s): LIPASE, AMYLASE in the last 168 hours. No results for input(s): AMMONIA in the last 168 hours. Cardiac Enzymes: No results for input(s): CKTOTAL, CKMB, CKMBINDEX, TROPONINI in the last 168 hours. BNP (last 3 results) Recent Labs    04/04/19 2000 07/03/19 1930 07/09/19 0347  BNP 76.7 1,124.1*  4,004.9*    ProBNP (last 3 results) No results for input(s): PROBNP in the last 8760 hours.  CBG: Recent Labs  Lab 08/16/19 1614 08/16/19 1959 08/16/19 2320 08/17/19 0346 08/17/19 0726  GLUCAP 161* 119* 101* 90 105*   No results found for this or any previous visit (from the past 240 hour(s)).   Studies: No results found.  Scheduled Meds: . acetaminophen  500 mg Oral BID  . Chlorhexidine Gluconate Cloth  6 each Topical Q0600  . collagenase   Topical BID  . diclofenac Sodium  2 g Topical BID  . diltiazem  120 mg Oral Daily  . famotidine  10 mg Oral Daily  . feeding supplement (PRO-STAT SUGAR FREE 64)  30 mL Per Tube Daily  . ferric citrate  210 mg Oral TID WC  . gabapentin  100 mg Per Tube Q12H  . Gerhardt's butt cream   Topical TID  . heparin  5,000 Units Subcutaneous Q8H  . loratadine  10 mg Oral Daily  . mouth rinse  15 mL Mouth Rinse BID  . multivitamin  1 tablet Oral QHS  . neomycin-bacitracin-polymyxin   Topical BID  . nystatin  5 mL Mouth/Throat QID  . pantoprazole sodium  40 mg Per Tube BID  . predniSONE  60 mg Oral Q breakfast   Followed by  . [START ON 08/22/2019] predniSONE  50 mg Oral Q breakfast   Followed by  . [START ON 08/26/2019] predniSONE  40 mg Oral Q breakfast   Followed by  . [START ON 09/17/2019] predniSONE  30 mg Oral Q breakfast  . sertraline  25 mg Oral BID  . sodium chloride flush  3 mL Intravenous Q12H  . sodium chloride irrigation  15 mL Irrigation QID  . thiamine  100 mg Oral Daily    Continuous Infusions: . sodium chloride    . sodium chloride    . sodium chloride    . sodium chloride    . sodium chloride    . feeding supplement (NEPRO CARB STEADY) 1,000 mL (08/15/19 0610)  . ondansetron (ZOFRAN) IV Stopped (08/15/19 1232)    Flora Lipps, MD  [facility] 08/17/2019

## 2019-08-17 NOTE — Progress Notes (Signed)
pts daily dressing to PEG site dressing performed, pt c/o that site is very tender. Upon pulling back dressing dark brown foul smelling leakage from around the site. Pts husband at bedside and states he doesn't know when last time dressing was changed. Site cleaned by this RN with NS and gauze, noted to be red and tender around insertion site. RN to page primary.

## 2019-08-18 LAB — CBC
HCT: 42.7 % (ref 36.0–46.0)
Hemoglobin: 12.4 g/dL (ref 12.0–15.0)
MCH: 27.3 pg (ref 26.0–34.0)
MCHC: 29 g/dL — ABNORMAL LOW (ref 30.0–36.0)
MCV: 93.8 fL (ref 80.0–100.0)
Platelets: 135 10*3/uL — ABNORMAL LOW (ref 150–400)
RBC: 4.55 MIL/uL (ref 3.87–5.11)
RDW: 19.9 % — ABNORMAL HIGH (ref 11.5–15.5)
WBC: 6.7 10*3/uL (ref 4.0–10.5)
nRBC: 0 % (ref 0.0–0.2)

## 2019-08-18 LAB — COMPREHENSIVE METABOLIC PANEL
ALT: 22 U/L (ref 0–44)
AST: 50 U/L — ABNORMAL HIGH (ref 15–41)
Albumin: 3 g/dL — ABNORMAL LOW (ref 3.5–5.0)
Alkaline Phosphatase: 104 U/L (ref 38–126)
Anion gap: 13 (ref 5–15)
BUN: 59 mg/dL — ABNORMAL HIGH (ref 8–23)
CO2: 27 mmol/L (ref 22–32)
Calcium: 9 mg/dL (ref 8.9–10.3)
Chloride: 93 mmol/L — ABNORMAL LOW (ref 98–111)
Creatinine, Ser: 2.06 mg/dL — ABNORMAL HIGH (ref 0.44–1.00)
GFR calc Af Amer: 28 mL/min — ABNORMAL LOW (ref >60)
GFR calc non Af Amer: 24 mL/min — ABNORMAL LOW (ref >60)
Glucose, Bld: 124 mg/dL — ABNORMAL HIGH (ref 70–99)
Potassium: 4.2 mmol/L (ref 3.5–5.1)
Sodium: 133 mmol/L — ABNORMAL LOW (ref 135–145)
Total Bilirubin: 0.4 mg/dL (ref 0.3–1.2)
Total Protein: 5.4 g/dL — ABNORMAL LOW (ref 6.5–8.1)

## 2019-08-18 LAB — MAGNESIUM: Magnesium: 2.4 mg/dL (ref 1.7–2.4)

## 2019-08-18 LAB — PHOSPHORUS: Phosphorus: 4.7 mg/dL — ABNORMAL HIGH (ref 2.5–4.6)

## 2019-08-18 LAB — GLUCOSE, CAPILLARY
Glucose-Capillary: 106 mg/dL — ABNORMAL HIGH (ref 70–99)
Glucose-Capillary: 116 mg/dL — ABNORMAL HIGH (ref 70–99)
Glucose-Capillary: 118 mg/dL — ABNORMAL HIGH (ref 70–99)
Glucose-Capillary: 130 mg/dL — ABNORMAL HIGH (ref 70–99)
Glucose-Capillary: 138 mg/dL — ABNORMAL HIGH (ref 70–99)
Glucose-Capillary: 92 mg/dL (ref 70–99)

## 2019-08-18 MED ORDER — CHLORHEXIDINE GLUCONATE CLOTH 2 % EX PADS
6.0000 | MEDICATED_PAD | Freq: Every day | CUTANEOUS | Status: DC
  Administered 2019-08-18 – 2019-08-21 (×4): 6 via TOPICAL

## 2019-08-18 NOTE — Progress Notes (Signed)
Retake vital signs after patient calmed from moving about in bed due to red MEW

## 2019-08-18 NOTE — Progress Notes (Signed)
Cardiology Progress Note  Patient ID: Joanna Ali MRN: [id number] DOB: 04-13-1951 Date of Encounter: 08/18/2019  Primary Cardiologist: Evalina Field, MD  Subjective  Doing well this morning.  Plans for hemodialysis.  Review of telemetry shows sinus rhythm with frequent PACs.  There is no atrial fibrillation.  ROS:  All other ROS reviewed and negative. Pertinent positives noted in the HPI.     Inpatient Medications  Scheduled Meds: . acetaminophen  500 mg Oral BID  . Chlorhexidine Gluconate Cloth  6 each Topical Q0600  . collagenase   Topical BID  . diclofenac Sodium  2 g Topical BID  . diltiazem  120 mg Oral Daily  . famotidine  10 mg Oral Daily  . feeding supplement (PRO-STAT SUGAR FREE 64)  30 mL Per Tube Daily  . ferric citrate  210 mg Oral TID WC  . gabapentin  100 mg Per Tube Q12H  . Gerhardt's butt cream   Topical TID  . heparin  5,000 Units Subcutaneous Q8H  . loratadine  10 mg Oral Daily  . mouth rinse  15 mL Mouth Rinse BID  . multivitamin  1 tablet Oral QHS  . neomycin-bacitracin-polymyxin   Topical BID  . nystatin  5 mL Mouth/Throat QID  . pantoprazole sodium  40 mg Per Tube BID  . predniSONE  60 mg Oral Q breakfast   Followed by  . [START ON 08/22/2019] predniSONE  50 mg Oral Q breakfast   Followed by  . [START ON 08/31/2019] predniSONE  40 mg Oral Q breakfast   Followed by  . [START ON 09/04/2019] predniSONE  30 mg Oral Q breakfast  . sertraline  25 mg Oral BID  . sodium chloride flush  3 mL Intravenous Q12H  . sodium chloride irrigation  15 mL Irrigation QID  . thiamine  100 mg Oral Daily   Continuous Infusions: . sodium chloride    . sodium chloride    . sodium chloride    . sodium chloride    . sodium chloride    . feeding supplement (NEPRO CARB STEADY) 1,000 mL (08/17/19 1821)   PRN Meds: sodium chloride, sodium chloride, sodium chloride, sodium chloride, sodium chloride, acetaminophen (TYLENOL) oral liquid 160 mg/5 mL, ALPRAZolam, alteplase, alum &  mag hydroxide-simeth, antiseptic oral rinse, bisacodyl, bisacodyl, camphor-menthol, heparin, hydrOXYzine, ondansetron (ZOFRAN) IV, oxyCODONE, polyethylene glycol, polyvinyl alcohol, senna-docusate, sodium chloride flush, witch hazel-glycerin   Vital Signs   Vitals:   08/17/19 1951 08/17/19 2302 08/18/19 0327 08/18/19 0722  BP: 117/75 112/71 (!) 146/86 (!) 157/90  Pulse: (!) 109 87 (!) 108 86  Resp: (!) 28 11 18 10   Temp: 97.7 F (36.5 C) 98.2 F (36.8 C) 98 F (36.7 C) 97.7 F (36.5 C)  TempSrc: Oral Oral Oral Axillary  SpO2: 97% 100% 100% 100%  Weight:      Height:        Intake/Output Summary (Last 24 hours) at 08/18/2019 0831 Last data filed at 08/17/2019 1217 Gross per 24 hour  Intake 200 ml  Output 1600 ml  Net -1400 ml   Last 3 Weights 08/18/2019 08/17/2019 08/17/2019  Weight (lbs) (No Data) 93 lb 91 lb  Weight (kg) (No Data) 42.185 kg 41.277 kg      Telemetry  Overnight telemetry shows normal sinus rhythm with frequent PACs, heart rate in the 90-100, which I personally reviewed.   ECG  The most recent ECG shows normal sinus rhythm, heart rate 89, EKG dated 08/09/2019, which I personally reviewed.  Physical Exam   Vitals:   08/17/19 1951 08/17/19 2302 08/18/19 0327 08/18/19 0722  BP: 117/75 112/71 (!) 146/86 (!) 157/90  Pulse: (!) 109 87 (!) 108 86  Resp: (!) 28 11 18 10   Temp: 97.7 F (36.5 C) 98.2 F (36.8 C) 98 F (36.7 C) 97.7 F (36.5 C)  TempSrc: Oral Oral Oral Axillary  SpO2: 97% 100% 100% 100%  Weight:      Height:         Intake/Output Summary (Last 24 hours) at 08/18/2019 0831 Last data filed at 08/17/2019 1217 Gross per 24 hour  Intake 200 ml  Output 1600 ml  Net -1400 ml    Last 3 Weights 08/18/2019 08/17/2019 08/17/2019  Weight (lbs) (No Data) 93 lb 91 lb  Weight (kg) (No Data) 42.185 kg 41.277 kg    Body mass index is 15.01 kg/m.  General: Frail, ill-appearing Head: Atraumatic, normal size  Eyes: PEERLA, EOMI  Neck: Supple, no  JVD Endocrine: No thryomegaly Cardiac: Normal S1, S2; RRR; no murmurs, rubs, or gallops Lungs: Diminished breath sounds bilaterally at the lung bases Abd: Soft, nontender, no hepatomegaly  Ext: Upper extremity digits are noticeably blue Musculoskeletal: No deformities, BUE and BLE strength normal and equal Skin: Warm and dry, no rashes   Neuro: Alert and oriented to person, place, time, and situation, CNII-XII grossly intact, no focal deficits  Psych: Normal mood and affect   Labs  High Sensitivity Troponin:  No results for input(s): TROPONINIHS in the last 720 hours.   Cardiac EnzymesNo results for input(s): TROPONINI in the last 168 hours. No results for input(s): TROPIPOC in the last 168 hours.  Chemistry Recent Labs  Lab 08/16/19 0257 08/17/19 0303 08/18/19 0240  NA 134* 132* 133*  K 4.3 4.5 4.2  CL 94* 92* 93*  CO2 26 22 27   GLUCOSE 98 97 124*  BUN 56* 98* 59*  CREATININE 2.16* 3.23* 2.06*  CALCIUM 9.1 9.5 9.0  PROT  --   --  5.4*  ALBUMIN 3.0* 3.1* 3.0*  AST  --   --  50*  ALT  --   --  22  ALKPHOS  --   --  104  BILITOT  --   --  0.4  GFRNONAA 22* 14* 24*  GFRAA 26* 16* 28*  ANIONGAP 14 18* 13    Hematology Recent Labs  Lab 08/15/19 0251 08/17/19 0303 08/18/19 0240  WBC 8.2 6.4 6.7  RBC 4.67 4.86 4.55  HGB 12.9 13.3 12.4  HCT 43.9 45.2 42.7  MCV 94.0 93.0 93.8  MCH 27.6 27.4 27.3  MCHC 29.4* 29.4* 29.0*  RDW 20.6* 19.9* 19.9*  PLT 174 119* 135*   BNPNo results for input(s): BNP, PROBNP in the last 168 hours.  DDimer No results for input(s): DDIMER in the last 168 hours.   Radiology  No results found.  Cardiac Studies  TTE 07/17/2019   1. Left ventricular ejection fraction, by visual estimation, is 50 to 55%. The left ventricle has low normal function. There is mildly increased left ventricular hypertrophy.  2. Moderate hypokinesis of the left ventricular, entire septal wall.  3. The left ventricle demonstrates regional wall motion  abnormalities.  4. Global right ventricle has normal systolic function.The right ventricular size is normal. No increase in right ventricular wall thickness.  5. Left atrial size was normal.  6. Right atrial size was mildly dilated.  7. Moderate pleural effusion in the left lateral region.  8. The pericardial effusion is  circumferential.  9. Trivial pericardial effusion is present. 10. The mitral valve is abnormal. Mild mitral valve regurgitation. 11. The tricuspid valve is grossly normal. Tricuspid valve regurgitation moderate. 12. Aortic valve regurgitation is moderate. 13. The aortic valve is tricuspid. Aortic valve regurgitation is moderate. Mild aortic valve sclerosis without stenosis. 14. The pulmonic valve was grossly normal. Pulmonic valve regurgitation is not visualized. 15. Moderately elevated pulmonary artery systolic pressure. 16. The inferior vena cava is dilated in size with >50% respiratory variability, suggesting right atrial pressure of 8 mmHg.  Patient Profile   69 y.o. female with PMH of HTN, OA, pericardial effusion, anasarca 2/2 liver disease, and moderate AI who was sent to the hospital by Dr. Audie Box on 07/03/2019 for worsening dyspnea and atrial tachycardia with HR 130s. CTA negative PE, but showed moderate pericardial effusion and airspace disease concerning for infection with superimposed edema. Bedside echo demonstrated large pericardial effusion. She underwent IV diuresis, renal function subsequently worsened. PYP scan negative for amyloid. Right heart cath performed on 12/17 showed mild PAH, CO 5.0, CI 3.2. 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 Echo 12/20 showed EF 45-50%, no temponade, moderate to severe AI, enlarging pericardial effusion. She underwent pericardial window by Dr. Wyvonnia Lora 07/12/2019. Repeat echo 12/28 EF 50-55%, mid MR, moderate AI. Worsening renal function (Cr 0.67 on arrival, 5.1 by  12/29) resulted in initiation of dialysis on 12/29. Tunneled HD catheter placed 12/29. G tube placed. Thoracentesis performed on 1/12 due to worsening dyspnea, 1100 ml light amber pleural fluid removed. Started on steroid by palliative care to stimulate appetite and reduce inflammatory pain and possibly undiagnosed rheumatological disorder. She had an episode of unresponsiveness on 1/14 after dialysis and second dose of ativan, this quickly resolved and felt to be overmedication.  Course has been complicated by paroxysmal atrial fibrillation.  She is now been anticoagulated due to perceived high bleeding risk.  Assessment & Plan   1.  Paroxysmal atrial fibrillation -Remains in normal sinus rhythm with PACs -Doing better on diltiazem due to Raynaud's -Continue diltiazem extended release tablet and 20 mg daily -I really see no atrial fibrillation.  No need for anticoagulation.  She has high bleeding risk to this recur.  Would prefer likely to not be on anticoagulation.  2.  Anasarca/pericardial effusion -Extensive work-up here.  Her right and left heart catheterization were consistent with tamponade.  Her PYP scan was negative for amyloid.  Her most recent echo shows low normal ejection fraction 50 to 55%.  She has moderate aortic insufficiency.  She is status post pericardial window. -Ultimately, it appears she has been diagnosed with scleroderma.  She has a positive anti-RNA-polymerase 3 antibody.  Hematology is concerned for some underlying malignancy that is possibly causing scleroderma secondary picture.  She is on empiric steroids.   For now, will defer management to hospital medicine team.    3.  AKI now on hemodialysis -Volume is managed by nephrology.  She is set up for outpatient hemodialysis.  4.  Moderate aortic insufficiency -No need for intervention at this time  For questions or updates, please contact [place] Please consult [email or web address] for contact info under    Signed, [place] T. Audie Box, [place]  08/18/2019 8:31 AM

## 2019-08-18 NOTE — Progress Notes (Signed)
Inpatient Rehab Admissions:  Inpatient Rehab Consult received.  I met with patient, husband, and PT at the bedside for rehabilitation assessment and to discuss goals and expectations of an inpatient rehab admission.  Pt has progressed since I last saw her, but continues to be extremely debilitated.  She may struggle with intensity of CIR, however she is also extremely motivated to improve.  I have no beds available for this patient over the weekend, but will f/u on Monday to see how she's doing and look for possible admission next week pending medical stability and bed availability.   Signed:  , PT, DPT Admissions Coordinator [phone] 08/18/19  11:45 AM    

## 2019-08-18 NOTE — Progress Notes (Signed)
Physical Therapy Treatment Patient Details Name: Joanna Ali MRN: [id number] DOB: Feb 09, 1951 Today's Date: 08/18/2019    History of Present Illness Pt is a 69 y.o. female admitted 07/03/19 with anasarca, fatigue and tachycardia. 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 Pt with pericardial effusion s/p pericardial window. PEG tube placed. PMH includes former tobacco and alcohol abuse, HTN, chronic pleural effusion.    PT Comments    Pt demonstrates improved activity tolerance compared to previous session, although still grossly limited. Pt continues to require physical assistance to perform all functional mobility due to generalized weakness and debility. Pt tolerates short bouts of activity and ambulation with seated rest breaks in between. Pt motivated by spouse and by use of ice chips as a reward this session. Pt will benefit from continued acute PT POC to improve activity tolerance and reduce caregiver burden.   Follow Up Recommendations  CIR;Supervision/Assistance - 24 hour     Equipment Recommendations  Wheelchair (measurements PT);Wheelchair cushion (measurements PT);Hospital bed    Recommendations for Other Services       Precautions / Restrictions Precautions Precautions: Fall Precaution Comments: PEG tube Restrictions Weight Bearing Restrictions: No    Mobility  Bed Mobility Overal bed mobility: Needs Assistance Bed Mobility: Rolling;Sidelying to Sit Rolling: Min assist Sidelying to sit: Mod assist          Transfers Overall transfer level: Needs assistance Equipment used: Rolling walker (2 wheeled) Transfers: Sit to/from Stand Sit to Stand: Mod assist Stand pivot transfers: Mod assist          Ambulation/Gait Ambulation/Gait assistance: Min assist Gait Distance (Feet): 6 Feet(6' x 2 trials) Assistive device: Rolling walker (2 wheeled) Gait Pattern/deviations: Trunk flexed;Shuffle;Step-to pattern Gait velocity: decreased Gait  velocity interpretation: <1.8 ft/sec, indicate of risk for recurrent falls General Gait Details: pt with short step to gait with reduced foot clearance bilaterally. Cues for improved trunk and cervical extension   Stairs             Wheelchair Mobility    Modified Rankin (Stroke Patients Only)       Balance Overall balance assessment: Needs assistance Sitting-balance support: Bilateral upper extremity supported;Feet supported Sitting balance-Leahy Scale: Fair Sitting balance - Comments: minG-close supervision   Standing balance support: Bilateral upper extremity supported Standing balance-Leahy Scale: Poor Standing balance comment: minA-minG with BUE support of RW                            Cognition Arousal/Alertness: Awake/alert Behavior During Therapy: WFL for tasks assessed/performed Overall Cognitive Status: Impaired/Different from baseline Area of Impairment: Problem solving                             Problem Solving: Slow processing        Exercises      General Comments General comments (skin integrity, edema, etc.): HR up to 141 with mobility, saturatign well on room air. HR recovers quickly with seated rest into 120s within 10 seconds of sitting and 100s with 1-2 minute rest      Pertinent Vitals/Pain Pain Assessment: Faces Faces Pain Scale: Hurts whole lot Pain Location: buttocks Pain Descriptors / Indicators: Sore Pain Intervention(s): Limited activity within patient's tolerance    Home Living                      Prior Function  PT Goals (current goals can now be found in the care plan section) Acute Rehab PT Goals Patient Stated Goal: to get stronger, possibly go to CIR Progress towards PT goals: Progressing toward goals(slowly)    Frequency    Min 3X/week      PT Plan Discharge plan needs to be updated    Co-evaluation              AM-PAC PT "6 Clicks" Mobility   Outcome  Measure  Help needed turning from your back to your side while in a flat bed without using bedrails?: A Lot Help needed moving from lying on your back to sitting on the side of a flat bed without using bedrails?: A Lot Help needed moving to and from a bed to a chair (including a wheelchair)?: A Little Help needed standing up from a chair using your arms (e.g., wheelchair or bedside chair)?: A Little Help needed to walk in hospital room?: A Lot Help needed climbing 3-5 steps with a railing? : Total 6 Click Score: 13    End of Session   Activity Tolerance: Patient limited by fatigue Patient left: in chair;with call bell/phone within reach;with family/visitor present Nurse Communication: Mobility status PT Visit Diagnosis: Unsteadiness on feet (R26.81);Muscle weakness (generalized) (M62.81);Difficulty in walking, not elsewhere classified (R26.2);Pain     Time: 5400-8676 PT Time Calculation (min) (ACUTE ONLY): 35 min  Charges:  $Gait Training: 8-22 mins $Therapeutic Activity: 8-22 mins                     Zenaida Niece, PT, DPT Acute Rehabilitation Pager: [phone]    Zenaida Niece 08/18/2019, 2:00 PM

## 2019-08-18 NOTE — Progress Notes (Signed)
[facility] ROUNDING NOTE   Subjective:   This is a 69 year old lady that was admitted on 07/03/2019 with a history of anasarca and pericardial effusion worsening dyspnea and underwent evaluation with PYP scanning that was negative for amyloid she also had a right heart catheterization 07/06/2019 that showed mild PAH and suspected pan serositis, a pericardial window was performed by Dr. Kipp Brood 07/12/2019.  Initial serological evaluation for autoimmune diseases was essentially negative, although she did have a positive anti-RNA polymerase 3 antibody.  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  Hematology raised the concern of a malignancy or scleroderma although an extensive search for malignancy was not recommended by Dr. Jana Hakim.  Initial plans were for transfer to tertiary center however this did not occur due to lack of bed availability.  She was initially started on dialysis 07/18/2019 due to edema that failed to resolve with IV diuretics and worsening serum creatinine.  She dialyzes with a tunneled dialysis catheter.  Patient tolerated dialysis 128 2021 with 1.6 L removed.  Blood pressure 137/90 pulse 93 temperature 97.7 O2 sats 90% room air  Sodium 133 potassium 4.2 chloride 93 CO2 27 BUN 59 creatinine 2 glucose 124 calcium 9.0 phosphorus 4.7 magnesium 2.4 Albumin 3.0 WBC 6.7 hemoglobin 12.4 platelets 135  Neurontin 100 mg twice daily Claritin 10 mg daily, Lopressor 25 mg twice daily, Protonix 40 mg twice daily, Zoloft 25 mg daily..  Prednisone taper starting at 60 mg.  08/15/2019.  Cardizem 120 mg daily  We will discontinue darbepoetin due to high hemoglobin  Objective:  Vital signs in last 24 hours:  Temp:  [97.5 F (36.4 C)-98.2 F (36.8 C)] 97.7 F (36.5 C) (01/29 0722) Pulse Rate:  [67-109] 86 (01/29 0722) Resp:  [10-28] 10 (01/29 0722) BP: (94-157)/(67-108) 157/90 (01/29 0722) SpO2:  [96 %-100 %] 100 % (01/29 0722) Weight:  [42.2 kg] 42.2 kg  (01/28 1217)  Weight change:  Filed Weights   08/17/19 0759 08/17/19 1217  Weight: 41.3 kg 42.2 kg    Intake/Output: I/O last 3 completed shifts: In: 200 [P.O.:200] Out: 1600 [Other:1600]   Intake/Output this shift:  No intake/output data recorded. Cachectic and frail CVS- RRR JVP not elevated left tunneled dialysis catheter RS- CTA no wheezes or rales ABD- BS present soft non-distended EXT- no edema   Basic Metabolic Panel: Recent Labs  Lab 08/14/19 0603 08/14/19 0603 08/15/19 0251 08/15/19 0251 08/16/19 0257 08/17/19 0303 08/18/19 0240  NA 137  --  135  --  134* 132* 133*  K 4.4  --  5.2*  --  4.3 4.5 4.2  CL 95*  --  93*  --  94* 92* 93*  CO2 22  --  22  --  26 22 27   GLUCOSE 45*  --  89  --  98 97 124*  BUN 82*  --  128*  --  56* 98* 59*  CREATININE 2.49*  --  3.50*  --  2.16* 3.23* 2.06*  CALCIUM 9.2   < > 9.5   < > 9.1 9.5 9.0  MG  --   --   --   --   --   --  2.4  PHOS 4.1  --  4.9*  --  4.7* 6.8* 4.7*   < > = values in this interval not displayed.    Liver Function Tests: Recent Labs  Lab 08/14/19 0603 08/15/19 0251 08/16/19 0257 08/17/19 0303 08/18/19 0240  AST  --   --   --   --  50*  ALT  --   --   --   --  22  ALKPHOS  --   --   --   --  104  BILITOT  --   --   --   --  0.4  PROT  --   --   --   --  5.4*  ALBUMIN 3.1* 3.0* 3.0* 3.1* 3.0*   No results for input(s): LIPASE, AMYLASE in the last 168 hours. No results for input(s): AMMONIA in the last 168 hours.  CBC: Recent Labs  Lab 08/12/19 0441 08/15/19 0251 08/17/19 0303 08/18/19 0240  WBC 4.2 8.2 6.4 6.7  HGB 12.4 12.9 13.3 12.4  HCT 42.9 43.9 45.2 42.7  MCV 94.9 94.0 93.0 93.8  PLT 252 174 119* 135*    Cardiac Enzymes: No results for input(s): CKTOTAL, CKMB, CKMBINDEX, TROPONINI in the last 168 hours.  BNP: Invalid input(s): POCBNP  CBG: Recent Labs  Lab 08/17/19 1625 08/17/19 1950 08/17/19 2259 08/18/19 0324 08/18/19 0720  GLUCAP 147* 163* 118* 138* 116*     Microbiology: Results for orders placed or performed during the hospital encounter of 07/03/19  Culture, blood (routine x 2)     Status: None   Collection Time: 07/03/19  7:28 PM   Specimen: BLOOD RIGHT FOREARM  Result Value Ref Range Status   Specimen Description BLOOD RIGHT FOREARM  Final   Special Requests   Final    BOTTLES DRAWN AEROBIC AND ANAEROBIC Blood Culture adequate volume   Culture   Final    NO GROWTH 5 DAYS Performed at [facility], [place] [address] [place], [place] [address]    Report Status 07/08/2019 FINAL  Final  Culture, blood (routine x 2)     Status: None   Collection Time: 07/03/19  7:34 PM   Specimen: BLOOD LEFT HAND  Result Value Ref Range Status   Specimen Description BLOOD LEFT HAND  Final   Special Requests AEROBIC BOTTLE ONLY Blood Culture adequate volume  Final   Culture   Final    NO GROWTH 5 DAYS Performed at [facility], [place] [address], [place], [place] [address]    Report Status 07/08/2019 FINAL  Final  SARS CORONAVIRUS 2 (TAT 6-24 HRS) Nasopharyngeal Nasopharyngeal Swab     Status: None   Collection Time: 07/03/19 10:11 PM   Specimen: Nasopharyngeal Swab  Result Value Ref Range Status   SARS Coronavirus 2 NEGATIVE NEGATIVE Final    Comment: (NOTE) SARS-CoV-2 target nucleic acids are NOT DETECTED. The SARS-CoV-2 RNA is generally detectable in upper and lower respiratory specimens during the acute phase of infection. Negative results do not preclude SARS-CoV-2 infection, do not rule out co-infections with other pathogens, and should not be used as the sole basis for treatment or other patient management decisions. Negative results must be combined with clinical observations, patient history, and epidemiological information. The expected result is Negative. Fact Sheet for Patients: SugarRoll.be Fact Sheet for Healthcare Providers: https://www.woods-mathews.com/ This test is not yet  approved or cleared by the [place] FDA and  has been authorized for detection and/or diagnosis of SARS-CoV-2 by FDA under an Emergency Use Authorization (EUA). This EUA will remain  in effect (meaning this test can be used) for the duration of the COVID-19 declaration under Section 56 4(b)(1) of the Act, 21 U.S.C. section 360bbb-3(b)(1), unless the authorization is terminated or revoked sooner. Performed at [facility], [place] [address] [place], [place] [address]  Respiratory Panel by PCR     Status: None   Collection Time: 07/03/19 10:11 PM   Specimen: Nasopharyngeal Swab; Respiratory  Result Value Ref Range Status   Adenovirus NOT DETECTED NOT DETECTED Final   Coronavirus 229E NOT DETECTED NOT DETECTED Final    Comment: (NOTE) The Coronavirus on the Respiratory Panel, DOES NOT test for the novel  Coronavirus (2019 nCoV)    Coronavirus HKU1 NOT DETECTED NOT DETECTED Final   Coronavirus NL63 NOT DETECTED NOT DETECTED Final   Coronavirus OC43 NOT DETECTED NOT DETECTED Final   Metapneumovirus NOT DETECTED NOT DETECTED Final   Rhinovirus / Enterovirus NOT DETECTED NOT DETECTED Final   Influenza A NOT DETECTED NOT DETECTED Final   Influenza B NOT DETECTED NOT DETECTED Final   Parainfluenza Virus 1 NOT DETECTED NOT DETECTED Final   Parainfluenza Virus 2 NOT DETECTED NOT DETECTED Final   Parainfluenza Virus 3 NOT DETECTED NOT DETECTED Final   Parainfluenza Virus 4 NOT DETECTED NOT DETECTED Final   Respiratory Syncytial Virus NOT DETECTED NOT DETECTED Final   Bordetella pertussis NOT DETECTED NOT DETECTED Final   Chlamydophila pneumoniae NOT DETECTED NOT DETECTED Final   Mycoplasma pneumoniae NOT DETECTED NOT DETECTED Final    Comment: Performed at [facility], [place]. [address] [place], [place] [address]  Respiratory Panel by RT PCR (Flu A&B, Covid) - Nasopharyngeal Swab     Status: None   Collection Time: 07/04/19  1:11 AM   Specimen: Nasopharyngeal Swab  Result  Value Ref Range Status   SARS Coronavirus 2 by RT PCR NEGATIVE NEGATIVE Final    Comment: (NOTE) SARS-CoV-2 target nucleic acids are NOT DETECTED. The SARS-CoV-2 RNA is generally detectable in upper respiratoy specimens during the acute phase of infection. The lowest concentration of SARS-CoV-2 viral copies this assay can detect is 131 copies/mL. A negative result does not preclude SARS-Cov-2 infection and should not be used as the sole basis for treatment or other patient management decisions. A negative result may occur with  improper specimen collection/handling, submission of specimen other than nasopharyngeal swab, presence of viral mutation(s) within the areas targeted by this assay, and inadequate number of viral copies (<131 copies/mL). A negative result must be combined with clinical observations, patient history, and epidemiological information. The expected result is Negative. Fact Sheet for Patients:  PinkCheek.be Fact Sheet for Healthcare Providers:  GravelBags.it This test is not yet ap proved or cleared by the [place] FDA and  has been authorized for detection and/or diagnosis of SARS-CoV-2 by FDA under an Emergency Use Authorization (EUA). This EUA will remain  in effect (meaning this test can be used) for the duration of the COVID-19 declaration under Section 564(b)(1) of the Act, 21 U.S.C. section 360bbb-3(b)(1), unless the authorization is terminated or revoked sooner.    Influenza A by PCR NEGATIVE NEGATIVE Final   Influenza B by PCR NEGATIVE NEGATIVE Final    Comment: (NOTE) The Xpert Xpress SARS-CoV-2/FLU/RSV assay is intended as an aid in  the diagnosis of influenza from Nasopharyngeal swab specimens and  should not be used as a sole basis for treatment. Nasal washings and  aspirates are unacceptable for Xpert Xpress SARS-CoV-2/FLU/RSV  testing. Fact Sheet for  Patients: PinkCheek.be Fact Sheet for Healthcare Providers: GravelBags.it This test is not yet approved or cleared by the [place] FDA and  has been authorized for detection and/or diagnosis of SARS-CoV-2 by  FDA under an Emergency Use Authorization (EUA). This EUA will remain  in effect (  meaning this test can be used) for the duration of the  Covid-19 declaration under Section 564(b)(1) of the Act, 21  U.S.C. section 360bbb-3(b)(1), unless the authorization is  terminated or revoked. Performed at [facility], [place] [address], [place], [place] [address]   MRSA PCR Screening     Status: None   Collection Time: 07/04/19  7:00 AM   Specimen: Nasopharyngeal  Result Value Ref Range Status   MRSA by PCR NEGATIVE NEGATIVE Final    Comment:        The GeneXpert MRSA Assay (FDA approved for NASAL specimens only), is one component of a comprehensive MRSA colonization surveillance program. It is not intended to diagnose MRSA infection nor to guide or monitor treatment for MRSA infections. Performed at [facility], [place] [address], [place], [place] [address]   Body fluid culture     Status: None   Collection Time: 07/04/19  1:23 PM   Specimen: Pleura; Body Fluid  Result Value Ref Range Status   Specimen Description PLEURAL RIGHT  Final   Special Requests NONE  Final   Gram Stain   Final    RARE WBC PRESENT, PREDOMINANTLY MONONUCLEAR NO ORGANISMS SEEN    Culture   Final    NO GROWTH Performed at [facility], [address], [place], [place] [address]    Report Status 07/07/2019 FINAL  Final  Body fluid culture (includes gram stain)     Status: None   Collection Time: 07/04/19  1:23 PM   Specimen: Pleural Fluid  Result Value Ref Range Status   Specimen Description PLEURAL LEFT  Final   Special Requests NONE  Final   Gram Stain   Final    RARE WBC PRESENT, PREDOMINANTLY MONONUCLEAR NO ORGANISMS  SEEN    Culture   Final    NO GROWTH Performed at [facility], [place] [address], [place], [place] [address]    Report Status 07/07/2019 FINAL  Final  Surgical pcr screen     Status: None   Collection Time: 07/12/19  3:31 PM   Specimen: Nasal Mucosa; Nasal Swab  Result Value Ref Range Status   MRSA, PCR NEGATIVE NEGATIVE Final   Staphylococcus aureus NEGATIVE NEGATIVE Final    Comment: (NOTE) The Xpert SA Assay (FDA approved for NASAL specimens in patients 6 years of age and older), is one component of a comprehensive surveillance program. It is not intended to diagnose infection nor to guide or monitor treatment. Performed at [facility], [place] [address], [place], [place] [address]   Fungus Culture With Stain     Status: None   Collection Time: 07/12/19  5:45 PM   Specimen: PATH Cytology Misc. fluid; Body Fluid  Result Value Ref Range Status   Fungus Stain Final report  Final   Fungus (Mycology) Culture Final report  Final    Comment: (NOTE) Performed At: [facility] [address] [place], [place] [id number] Rush Farmer MD [id number]    Fungal Source FLUID  Final    Comment: PERICARDIAL Performed at [facility], [place] [address] [place],  [address]   Aerobic/Anaerobic Culture (surgical/deep wound)     Status: None   Collection Time: 07/12/19  5:45 PM   Specimen: PATH Cytology Misc. fluid; Body Fluid  Result Value Ref Range Status   Specimen Description FLUID PERICARDIAL  Final   Special Requests NONE  Final   Gram Stain NO WBC SEEN NO ORGANISMS  SEEN   Final   Culture   Final    No growth aerobically or anaerobically. Performed at [facility], [place] [address] [place], [place] [address]    Report Status 07/17/2019 FINAL  Final  Acid Fast Smear (AFB)     Status: None   Collection Time: 07/12/19  5:45 PM   Specimen: PATH Cytology Misc. fluid; Body Fluid  Result Value Ref Range Status   AFB Specimen Processing  Concentration  Final   Acid Fast Smear Negative  Final    Comment: (NOTE) Performed At: [facility] [place], [place] [id number] Rush Farmer MD [id number]    Source (AFB) FLUID  Final    Comment: PERICARDIAL Performed at [facility], [place] [address] [place], [place] [address]   Fungus Culture Result     Status: None   Collection Time: 07/12/19  5:45 PM  Result Value Ref Range Status   Result 1 Comment  Final    Comment: (NOTE) KOH/Calcofluor preparation:  no fungus observed. Performed At: [facility] [place], [place] [id number] Rush Farmer MD [id number]   Fungal organism reflex     Status: None   Collection Time: 07/12/19  5:45 PM  Result Value Ref Range Status   Fungal result 1 Comment  Final    Comment: (NOTE) No yeast or mold isolated after 4 weeks. Performed At: [facility] [place], [place] [id number] Rush Farmer MD [id number]   Culture, group A strep     Status: None   Collection Time: 07/26/19 12:21 PM   Specimen: Throat  Result Value Ref Range Status   Specimen Description THROAT  Final   Special Requests NONE  Final   Culture   Final    NO GROUP A STREP (S.PYOGENES) ISOLATED Performed at [facility], [place] [address] [place], [place] [address]    Report Status 07/28/2019 FINAL  Final    Coagulation Studies: No results for input(s): LABPROT, INR in the last 72 hours.  Urinalysis: No results for input(s): COLORURINE, LABSPEC, PHURINE, GLUCOSEU, HGBUR, BILIRUBINUR, KETONESUR, PROTEINUR, UROBILINOGEN, NITRITE, LEUKOCYTESUR in the last 72 hours.  Invalid input(s): APPERANCEUR    Imaging: No results found.   Medications:   . sodium chloride    . sodium chloride    . sodium chloride    . sodium chloride    . sodium chloride    . feeding supplement (NEPRO CARB STEADY) 1,000 mL (08/17/19 1821)   . acetaminophen  500 mg Oral BID  . Chlorhexidine  Gluconate Cloth  6 each Topical Q0600  . collagenase   Topical BID  . diclofenac Sodium  2 g Topical BID  . diltiazem  120 mg Oral Daily  . famotidine  10 mg Oral Daily  . feeding supplement (PRO-STAT SUGAR FREE 64)  30 mL Per Tube Daily  . ferric citrate  210 mg Oral TID WC  . gabapentin  100 mg Per Tube Q12H  . Gerhardt's butt cream   Topical TID  . heparin  5,000 Units Subcutaneous Q8H  . loratadine  10 mg Oral Daily  . mouth rinse  15 mL Mouth Rinse BID  . multivitamin  1 tablet Oral QHS  . neomycin-bacitracin-polymyxin   Topical BID  . nystatin  5 mL Mouth/Throat QID  . pantoprazole sodium  40 mg Per Tube BID  . predniSONE  60 mg Oral Q breakfast   Followed by  . [START ON  08/22/2019] predniSONE  50 mg Oral Q breakfast   Followed by  . [START ON 08/21/2019] predniSONE  40 mg Oral Q breakfast   Followed by  . [START ON 08/23/2019] predniSONE  30 mg Oral Q breakfast  . sertraline  25 mg Oral BID  . sodium chloride flush  3 mL Intravenous Q12H  . sodium chloride irrigation  15 mL Irrigation QID  . thiamine  100 mg Oral Daily   sodium chloride, sodium chloride, sodium chloride, sodium chloride, sodium chloride, acetaminophen (TYLENOL) oral liquid 160 mg/5 mL, ALPRAZolam, alteplase, alum & mag hydroxide-simeth, antiseptic oral rinse, bisacodyl, bisacodyl, camphor-menthol, heparin, hydrOXYzine, ondansetron (ZOFRAN) IV, oxyCODONE, polyethylene glycol, polyvinyl alcohol, senna-docusate, sodium chloride flush, witch hazel-glycerin  Assessment/ Plan:   Acute kidney injury with a creatinine of 1.43 07/03/2019 thought to be hemodynamically mediated possible hepatorenal syndrome with concomitant use of IV contrast urinalysis was unremarkable autoimmune work-up has been unremarkable however there is some consideration of scleroderma.  She was initiated on dialysis due to progressive loss of renal function and she is anuric this was started 07/18/2019.  She no longer has a slot at [facility] for  dialysis. We will continue on dialysis 1 32,021.  Anemia ESA was held 08/11/2019.  Will discontinue darbepoetin  Malnutrition low albumin  Bones check PTH and start Auryxia 210 mg 1 with meals  Moderate to large pericardial effusion status post pericardial window 08/11/2018  Liver disease diagnosed in [facility] with history of steatosis.  Scleroderma with crest syndrome.  Initiated on prednisone taper starting at 60 mg daily 08/15/2019  Paroxysmal atrial fibrillation   appreciate assistance from Dr. Davina Poke.  No anticoagulation for now due to high risk of bleeding recent pericardial effusion and window.  Patient awaiting possible transfer to [facility] to [place].  Palliative care following   LOS: Roland @TODAY @10 :57 AM

## 2019-08-18 NOTE — Progress Notes (Signed)
PROGRESS NOTE  Joanna Ali JAS:[id number] DOB: 1951-04-26 DOA: 07/03/2019 PCP: Renaldo Reel, DO   LOS: 45 days   Brief narrative: As per HPI,  69 year old female with a history of hypertension, chronic pericardial effusion, moderate aortic valve regurgitation, former smoker, alcohol abuse initially admitted on 07/06/2019 for progressive shortness of breath with anasarca and fatigue.  She was previously worked up by [facility] rheumatology with negative work-up including ESR, lupus anticoagulant, rheumatoid factor, TSH, ANCA, ANA, light free chain studies.  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  Since that time, patient had stopped drinking alcohol as advised.  She has been on diuretics and spironolactone without any improvement in symptoms.    CT of the chest on 07/04/2019 noted moderate pericardial effusion and airspace disease.  She underwent thoracentesis by PCCM which removed 700 mL of the fluid.  Patient underwent right heart cath on 07/06/2019 and pericardial window on 07/12/2019.  Attempts were made to diurese with IV diuretics but patient with worsening kidney function with creatinine up to 5.  Nephrology was formally consulted patient was started on dialysis.  She has been having poor p.o. intake so cortrak tube was placed.  She was then later placed on PEG tube for persistent poor p.o. intake, also patient developed oral pain after NG tube placement.  She is on hemodialysis for AKI with rapid progression to end-stage renal disease, severe anasarca, pericardial effusion, status post pericardial window.    Her labs showed RF positive at 71, INT RNA polymerase antibody +146 concern for scleroderma also she had some thickening of the skin with renal failure and pericardial involvement.  Pulmonary was consulted to evaluate for ILD. On  discussion with rheumatology at [facility] who recommended supportive care, acid reflux treated with PPI, joint pain Voltaren gel and Tylenol.  Prednisone taper.  For Raynaud's phenomenon calcium channel blocker.  If patient has interstitial lung disease then she should be evaluated for immunotherapy can be as outpatient when stronger.  Rheumatology did not recommend immunotherapy for just joint pain and skin manifestation.  Dr. Kathlene November, rheumatologist was consulted on phone.  He recommended to start high-dose IV Solu-Medrol 125 mg daily for 3 days and then prednisone 60 mg daily for week then prednisone 50 mg daily for a week then prednisone 40 mg daily for a week then prednisone 30 mg daily for a week which can be continued till seen by rheumatologist as outpatient.  Assessment/Plan:  Principal Problem:   Anasarca Active Problems:   Pericardial effusion   Hypertension   Elevated troponin   Atypical pneumonia   Hypoxia   Protein-calorie malnutrition, severe   Pressure injury of skin   Failure to thrive in adult   Goals of care, counseling/discussion   Dysphagia   Muscular weakness   Thrush of mouth and esophagus (HCC)   ESRD on hemodialysis (HCC)   Dyspnea   S/P thoracentesis   Atrial fibrillation with RVR (HCC)   AKI (acute kidney injury) ([place])   Scleroderma progressive (HCC)   Rheumatoid arthritis (HCC)   CREST syndrome (HCC)  Scleroderma /anasarca/joint pain/pericardial effusion-patient presented with anasarca, failure to thrive, joint pains, pericardial effusion, Raynaud's phenomenon.  She had positive anti-RNA polymerase 3 antibody.    Rheumatology evaluation in September 2020 showed CRP 13, ESR 25, ANA negative, c-ANCA/p-ANCA negative, scleroderma SCL 70 -, SSS (ro) negative, M spike negative electrophoresis,  HIV negative.  Double-stranded DNA negative, ANA negative, RF 71.   Rheumatology at [facility] recommended quick taper as we were considering scleroderma. Rheumatologist Dr.  Kathlene November, who recommended high-dose steroids, patient started on Solu-Medrol 125 mg IV for 3 days then switch to  prednisone 60 mg daily for 2 weeks and then slow taper.  She has completed IV Solu-Medrol 125 mg for 3 days.   Plan is 60 mg prednisone daily for a week then wean down to 50 mg daily for a week then 40 mg daily for a week then 30 mg daily.  Patient can then continue with prednisone 30 mg daily and follow-up with rheumatology as outpatient.  Started prednisone 60-milligram daily for 1 week from 08/14/2019.  Raynaud's phenomenon/crest syndrome-patient has cold hands with peripheral cyanosis, likely due to underlying scleroderma.  Beta-blocker has been discontinued and switched to Cardizem. Vascular surgery has been consulted for this- impression is Raynaud's phenomena and no further intervention.  Atrial fibrillation RVR.  Patient was initially on beta-blocker.  Patient is not a good candidate for anticoagulation considering multiple comorbid conditions, pericardial window, ESRD and frailty.  Due to worsening Raynaud's phenomena, beta-blockers were changed to calcium channel blocker.    Leukopenia. improved. Oncology was consulted and does not recommend  search for malignancy.  CT chest  was negative, CT abdomen/pelvis on 1/16 was unremarkable , repeat SPEP showed no monoclonal protein with normal kappa lambda ratio.  She also had negative pleural fluid cytology x4.  Screening for other malignancy will be routine.  Most recent WBC count of 6.4.  No fever  Acute kidney injury now on hemodialysis due to progressive deterioration of renal function and anuria.  Nephrology on board.  on hemodialysis with improvement of anasarca.  Outpatient dialysis has been set up.  Continue hemodialysis Tuesday, Thursday and Saturday.  Lethargy, metabolic encephalopathy-improved after hemodialysis and cutting down on narcotics. Ammonia level 29, Patient also received IV thiamine 250 mg 3 times daily for 3  days.  Acute hypoxic respiratory failure/pulmonary edema/pleural effusion-improved.  at this time. on room air currently. Underwent repeat thoracentesis on 08/01/2019.  Removed 1.1 L from left and 500 mL from right.  Pleural cytology showed reactive mesothelial cells.  Pleural fluid culture showed no growth.   Pericardial effusion with elevated troponin- Patient is status post pericardial window on 07/12/2019.  Pathology showed no malignancy, chronic inflammation, AFB negative.  Fungal culture  negative.  Atypical pneumonia with hypoxia-completed antibiotic treatment.  Currently on room air  Severe malnutrition/failure to thrive- seen by speech therapy and has been started on regular diet.  Has PEG tube feeding.  Dysphagia/thrush-group A strep was negative on 07/28/2019, patient currently on oral nystatin, oral Diflucan.  Anxiety-continue Zoloft, Xanax 0.25 mg 3 times daily as needed for panic attack.    Back pain-continue with oxycodone as needed, Neurontin, Voltaren gel twice a day.  Hypertension-continue Cardizem  Sacral decubitus ulcer stage II present on admission.  Continue wound care.  VTE Prophylaxis: Heparin subq  Code Status: Full code  Family Communication:  None today. Spoke with the patient's husband yesterday who wanted to try inpatient rehab and wanted to discuss with transition of care.  Also had a prolonged discussion with the patient regarding skilled nursing facility.  Disposition Plan:    Skilled nursing facility as per physical therapy recommendation. Continue hemodialysis.  Follow rehab reevaluation.  Consultants:  Oncology  Nephrology  Palliative care  PMR  Procedures:  Hemodialysis  Thoracocentesis   right heart cath on  07/06/2019    pericardial window on 07/12/2019.  PEG tube placement  Antibiotics: None currently  Anti-infectives (From admission, onward)   Start     Dose/Rate Route Frequency Ordered Stop   07/29/19 1500   fluconazole (DIFLUCAN) tablet 200 mg     200 mg Oral Daily 07/29/19 1458 08/11/19 2116   07/28/19 1300  ceFAZolin (ANCEF) 2-4 GM/100ML-% IVPB    Note to Pharmacy: Kandy Garrison   : cabinet override      07/28/19 1300 07/28/19 1806   07/28/19 0700  ceFAZolin (ANCEF) IVPB 2g/100 mL premix     2 g 200 mL/hr over 30 Minutes Intravenous On call 07/27/19 1502 07/28/19 1340   07/18/19 1316  ceFAZolin (ANCEF) 2-4 GM/100ML-% IVPB    Note to Pharmacy: Manuela Neptune   : cabinet override      07/18/19 1316 07/18/19 1318   07/12/19 2115  ceFAZolin (ANCEF) IVPB 1 g/50 mL premix     1 g 100 mL/hr over 30 Minutes Intravenous Every 8 hours 07/12/19 2102 07/13/19 0516   07/03/19 2245  cefTRIAXone (ROCEPHIN) 2 g in sodium chloride 0.9 % 100 mL IVPB  Status:  Discontinued     2 g 200 mL/hr over 30 Minutes Intravenous Every 24 hours 07/03/19 2230 07/04/19 1336   07/03/19 2245  azithromycin (ZITHROMAX) 500 mg in sodium chloride 0.9 % 250 mL IVPB  Status:  Discontinued     500 mg 250 mL/hr over 60 Minutes Intravenous Every 24 hours 07/03/19 2230 07/05/19 1037     Subjective:  Today, patient was seen and examined at bedside.  Denies any nausea or vomiting today but has intermittent abdominal pain at the site of PEG tube.  Denies chest pain, palpitation, fever or chills.   Objective: Vitals:   08/18/19 0327 08/18/19 0722  BP: (!) 146/86 (!) 157/90  Pulse: (!) 108 86  Resp: 18 10  Temp: 98 F (36.7 C) 97.7 F (36.5 C)  SpO2: 100% 100%    Intake/Output Summary (Last 24 hours) at 08/18/2019 0725 Last data filed at 08/17/2019 1217 Gross per 24 hour  Intake 200 ml  Output 1600 ml  Net -1400 ml   Filed Weights   08/17/19 0759 08/17/19 1217  Weight: 41.3 kg 42.2 kg   Body mass index is 15.01 kg/m.   Physical Exam:  GENERAL: Patient is alert awake communicative.  Thinly built.  Deconditioned and frail.  Weak voice.  HENT: No scleral pallor or icterus. Pupils equally reactive to light. Oral  mucosa is moist.  Left internal jugular hemodialysis catheter in place NECK: is supple, no palpable thyroid enlargement. CHEST: Clear to auscultation. No crackles or wheezes. Non tender on palpation. Diminished breath sounds bilaterally. CVS: S1 and S2 heard, no murmur. Regular rate and rhythm. No pericardial rub. ABDOMEN: Soft, non-tender, bowel sounds are present.  PEG tube in place with mild erythema at the site. EXTREMITIES: Cold hands, cyanosis of the digits noted CNS: Cranial nerves are intact. No focal motor or sensory deficits.  Denies weakness noted SKIN: warm and dry without rashes.  Cyanotic extremities.  Taut skin over the lower extremities.  Data Review: I have personally reviewed the following laboratory data and studies,  CBC: Recent Labs  Lab 08/12/19 0441 08/15/19 0251 08/17/19 0303 08/18/19 0240  WBC 4.2 8.2 6.4 6.7  HGB 12.4 12.9 13.3 12.4  HCT 42.9 43.9 45.2 42.7  MCV 94.9 94.0 93.0 93.8  PLT 252 174 119* 321*   Basic Metabolic Panel: Recent Labs  Lab 08/14/19 0603 08/15/19 0251 08/16/19 0257 08/17/19 0303 08/18/19 0240  NA 137 135 134* 132* 133*  K 4.4 5.2* 4.3 4.5 4.2  CL 95* 93* 94* 92* 93*  CO2 22 22 26 22 27   GLUCOSE 45* 89 98 97 124*  BUN 82* 128* 56* 98* 59*  CREATININE 2.49* 3.50* 2.16* 3.23* 2.06*  CALCIUM 9.2 9.5 9.1 9.5 9.0  MG  --   --   --   --  2.4  PHOS 4.1 4.9* 4.7* 6.8* 4.7*   Liver Function Tests: Recent Labs  Lab 08/14/19 0603 08/15/19 0251 08/16/19 0257 08/17/19 0303 08/18/19 0240  AST  --   --   --   --  50*  ALT  --   --   --   --  22  ALKPHOS  --   --   --   --  104  BILITOT  --   --   --   --  0.4  PROT  --   --   --   --  5.4*  ALBUMIN 3.1* 3.0* 3.0* 3.1* 3.0*   No results for input(s): LIPASE, AMYLASE in the last 168 hours. No results for input(s): AMMONIA in the last 168 hours. Cardiac Enzymes: No results for input(s): CKTOTAL, CKMB, CKMBINDEX, TROPONINI in the last 168 hours. BNP (last 3 results) Recent  Labs    04/04/19 2000 07/03/19 1930 07/09/19 0347  BNP 76.7 1,124.1* 4,004.9*    ProBNP (last 3 results) No results for input(s): PROBNP in the last 8760 hours.  CBG: Recent Labs  Lab 08/17/19 0726 08/17/19 1625 08/17/19 1950 08/17/19 2259 08/18/19 0324  GLUCAP 105* 147* 163* 118* 138*   No results found for this or any previous visit (from the past 240 hour(s)).   Studies: No results found.  Scheduled Meds: . acetaminophen  500 mg Oral BID  . Chlorhexidine Gluconate Cloth  6 each Topical Q0600  . collagenase   Topical BID  . diclofenac Sodium  2 g Topical BID  . diltiazem  120 mg Oral Daily  . famotidine  10 mg Oral Daily  . feeding supplement (PRO-STAT SUGAR FREE 64)  30 mL Per Tube Daily  . ferric citrate  210 mg Oral TID WC  . gabapentin  100 mg Per Tube Q12H  . Gerhardt's butt cream   Topical TID  . heparin  5,000 Units Subcutaneous Q8H  . loratadine  10 mg Oral Daily  . mouth rinse  15 mL Mouth Rinse BID  . multivitamin  1 tablet Oral QHS  . neomycin-bacitracin-polymyxin   Topical BID  . nystatin  5 mL Mouth/Throat QID  . pantoprazole sodium  40 mg Per Tube BID  . predniSONE  60 mg Oral Q breakfast   Followed by  . [START ON 08/22/2019] predniSONE  50 mg Oral Q breakfast   Followed by  . [START ON 08/30/2019] predniSONE  40 mg Oral Q breakfast   Followed by  . [START ON 09/07/2019] predniSONE  30 mg Oral Q breakfast  . sertraline  25 mg Oral BID  . sodium chloride flush  3 mL Intravenous Q12H  . sodium chloride irrigation  15 mL Irrigation QID  . thiamine  100 mg Oral Daily    Continuous Infusions: . sodium chloride    . sodium chloride    . sodium chloride    . sodium chloride    . sodium chloride    . feeding supplement (NEPRO CARB STEADY) 1,000 mL (  08/17/19 1821)    Flora Lipps, MD  [facility] 08/18/2019

## 2019-08-19 LAB — RENAL FUNCTION PANEL
Albumin: 3 g/dL — ABNORMAL LOW (ref 3.5–5.0)
Anion gap: 23 — ABNORMAL HIGH (ref 5–15)
BUN: 111 mg/dL — ABNORMAL HIGH (ref 8–23)
CO2: 21 mmol/L — ABNORMAL LOW (ref 22–32)
Calcium: 9.2 mg/dL (ref 8.9–10.3)
Chloride: 87 mmol/L — ABNORMAL LOW (ref 98–111)
Creatinine, Ser: 3.22 mg/dL — ABNORMAL HIGH (ref 0.44–1.00)
GFR calc Af Amer: 16 mL/min — ABNORMAL LOW (ref >60)
GFR calc non Af Amer: 14 mL/min — ABNORMAL LOW (ref >60)
Glucose, Bld: 76 mg/dL (ref 70–99)
Phosphorus: 6.1 mg/dL — ABNORMAL HIGH (ref 2.5–4.6)
Potassium: 4.6 mmol/L (ref 3.5–5.1)
Sodium: 131 mmol/L — ABNORMAL LOW (ref 135–145)

## 2019-08-19 LAB — GLUCOSE, CAPILLARY
Glucose-Capillary: 121 mg/dL — ABNORMAL HIGH (ref 70–99)
Glucose-Capillary: 126 mg/dL — ABNORMAL HIGH (ref 70–99)
Glucose-Capillary: 131 mg/dL — ABNORMAL HIGH (ref 70–99)
Glucose-Capillary: 128 mg/dL — ABNORMAL HIGH (ref 70–99)

## 2019-08-19 LAB — CBC
HCT: 42.9 % (ref 36.0–46.0)
Hemoglobin: 12.6 g/dL (ref 12.0–15.0)
MCH: 27.3 pg (ref 26.0–34.0)
MCHC: 29.4 g/dL — ABNORMAL LOW (ref 30.0–36.0)
MCV: 93.1 fL (ref 80.0–100.0)
Platelets: 107 10*3/uL — ABNORMAL LOW (ref 150–400)
RBC: 4.61 MIL/uL (ref 3.87–5.11)
RDW: 20.1 % — ABNORMAL HIGH (ref 11.5–15.5)
WBC: 4.9 10*3/uL (ref 4.0–10.5)
nRBC: 0 % (ref 0.0–0.2)

## 2019-08-19 LAB — PARATHYROID HORMONE, INTACT (NO CA): PTH: 76 pg/mL — ABNORMAL HIGH (ref 15–65)

## 2019-08-19 MED ORDER — FERRIC CITRATE 1 GM 210 MG(FE) PO TABS
420.0000 mg | ORAL_TABLET | Freq: Three times a day (TID) | ORAL | Status: DC
  Administered 2019-08-19 – 2019-08-23 (×9): 420 mg via ORAL
  Filled 2019-08-19 (×11): qty 2

## 2019-08-19 MED ORDER — HEPARIN SODIUM (PORCINE) 1000 UNIT/ML IJ SOLN
INTRAMUSCULAR | Status: AC
  Administered 2019-08-19: 12:00:00 3800 [IU] via INTRAVENOUS_CENTRAL
  Filled 2019-08-19: qty 4

## 2019-08-19 NOTE — Progress Notes (Signed)
Progress Note   Subjective   She just returned from HD.  She is weak and tired.  Denies CP or SOB.  Inpatient Medications    Scheduled Meds: . acetaminophen  500 mg Oral BID  . Chlorhexidine Gluconate Cloth  6 each Topical Q0600  . collagenase   Topical BID  . diclofenac Sodium  2 g Topical BID  . diltiazem  120 mg Oral Daily  . famotidine  10 mg Oral Daily  . feeding supplement (PRO-STAT SUGAR FREE 64)  30 mL Per Tube Daily  . ferric citrate  420 mg Oral TID WC  . gabapentin  100 mg Per Tube Q12H  . Gerhardt's butt cream   Topical TID  . heparin  5,000 Units Subcutaneous Q8H  . loratadine  10 mg Oral Daily  . mouth rinse  15 mL Mouth Rinse BID  . multivitamin  1 tablet Oral QHS  . neomycin-bacitracin-polymyxin   Topical BID  . nystatin  5 mL Mouth/Throat QID  . pantoprazole sodium  40 mg Per Tube BID  . predniSONE  60 mg Oral Q breakfast   Followed by  . [START ON 08/22/2019] predniSONE  50 mg Oral Q breakfast   Followed by  . [START ON 08/24/2019] predniSONE  40 mg Oral Q breakfast   Followed by  . [START ON 08/22/2019] predniSONE  30 mg Oral Q breakfast  . sertraline  25 mg Oral BID  . sodium chloride irrigation  15 mL Irrigation QID  . thiamine  100 mg Oral Daily   Continuous Infusions: . sodium chloride    . sodium chloride    . feeding supplement (NEPRO CARB STEADY) 1,000 mL (08/17/19 1821)   PRN Meds: sodium chloride, sodium chloride, acetaminophen (TYLENOL) oral liquid 160 mg/5 mL, ALPRAZolam, alteplase, alum & mag hydroxide-simeth, antiseptic oral rinse, bisacodyl, bisacodyl, camphor-menthol, heparin, hydrOXYzine, ondansetron (ZOFRAN) IV, oxyCODONE, polyethylene glycol, polyvinyl alcohol, senna-docusate, witch hazel-glycerin   Vital Signs    Vitals:   08/19/19 1030 08/19/19 1100 08/19/19 1130 08/19/19 1153  BP: (!) 161/76 (!) 151/55 (!) 128/93 (!) 148/89  Pulse: 88 77 (!) 113 86  Resp:    (!) 22  Temp:    97.9 F (36.6 C)  TempSrc:    Oral  SpO2:     100%  Weight:    39 kg  Height:        Intake/Output Summary (Last 24 hours) at 08/19/2019 1239 Last data filed at 08/19/2019 1153 Gross per 24 hour  Intake 203 ml  Output 1903 ml  Net -1700 ml   Filed Weights   08/19/19 0400 08/19/19 0748 08/19/19 1153  Weight: 39.9 kg 41.3 kg 39 kg    Telemetry    Sinus with PACs, nonsustained atach - Personally Reviewed  Physical Exam   GEN- The patient is ill and frail appearing, sleeping but rouses Head- normocephalic, atraumatic Eyes-  Sclera clear, conjunctiva pink Ears- hearing intact  Oropharynx- clear Neck- supple, Lungs-  normal work of breathing Heart- Regular rate and rhythm  GI- soft  Extremities- mild cyanosis noted MS- diffuse atrophy atrophy Skin- very tight skin is noted Psych- euthymic mood, full affect Neuro- strength and sensation are intact   Labs    Chemistry Recent Labs  Lab 08/17/19 0303 08/18/19 0240 08/19/19 0650  NA 132* 133* 131*  K 4.5 4.2 4.6  CL 92* 93* 87*  CO2 22 27 21*  GLUCOSE 97 124* 76  BUN 98* 59* 111*  CREATININE 3.23* 2.06*  3.22*  CALCIUM 9.5 9.0 9.2  PROT  --  5.4*  --   ALBUMIN 3.1* 3.0* 3.0*  AST  --  50*  --   ALT  --  22  --   ALKPHOS  --  104  --   BILITOT  --  0.4  --   GFRNONAA 14* 24* 14*  GFRAA 16* 28* 16*  ANIONGAP 18* 13 23*     Hematology Recent Labs  Lab 08/17/19 0303 08/18/19 0240 08/19/19 0650  WBC 6.4 6.7 4.9  RBC 4.86 4.55 4.61  HGB 13.3 12.4 12.6  HCT 45.2 42.7 42.9  MCV 93.0 93.8 93.1  MCH 27.4 27.3 27.3  MCHC 29.4* 29.0* 29.4*  RDW 19.9* 19.9* 20.1*  PLT 119* 135* 107*    Patient Profile   69 y.o.femalewith PMH of HTN, OA, pericardial effusion, anasarca 2/2 liver disease, and moderate AI who was sent to the hospital by Dr. Audie Box on 07/03/2019 for worsening dyspnea and atrial tachycardia with HR 130s. CTA negative PE, but showed moderate pericardial effusion and airspace disease concerning for infection with superimposed edema. Bedside  echo demonstrated large pericardial effusion. She underwent IV diuresis, renal function subsequently worsened. PYP scan negative for amyloid. Right heart cath performed on 12/17 showed mild PAH, CO 5.0, CI 3.2. 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 Echo 12/20 showed EF 45-50%, no temponade, moderate to severe AI, enlarging pericardial effusion. She underwent pericardial window by Dr. Wyvonnia Lora 07/12/2019. Repeat echo 12/28 EF 50-55%, mid MR, moderate AI. Worsening renal function (Cr 0.67 on arrival, 5.1 by 12/29) resulted in initiation of dialysis on 12/29. Tunneled HD catheter placed 12/29. G tube placed. Thoracentesis performed on 1/12 due to worsening dyspnea, 1100 ml light amber pleural fluid removed. Started on steroid by palliative care to stimulate appetite and reduce inflammatory pain and possibly undiagnosed rheumatological disorder. She had an episode of unresponsiveness on 1/14 after dialysis and second dose of ativan, this quickly resolved and felt to be overmedication.     Assessment & Plan    1. Pericardial effusion S/p pericardial window Likely autoimmune, now on steroids  2. AKI Now on HD  3. Moderate AI Stable No change required today  No further inpatient cardiology workup planned Will see as needed Please call with questions  Thompson Grayer MD, [facility] 08/19/2019 12:39 PM

## 2019-08-19 NOTE — Plan of Care (Signed)

## 2019-08-19 NOTE — Progress Notes (Signed)
PROGRESS NOTE  Joanna Ali LZJ:[id number] DOB: 23-Jun-1951 DOA: 07/03/2019 PCP: Renaldo Reel, DO   LOS: 46 days   Brief narrative: As per HPI,  69 year old female with a history of hypertension, chronic pericardial effusion, moderate aortic valve regurgitation, former smoker, alcohol abuse initially admitted on 07/06/2019 for progressive shortness of breath with anasarca and fatigue.  She was previously worked up by [facility] rheumatology with negative work-up including ESR, lupus anticoagulant, rheumatoid factor, TSH, ANCA, ANA, light free chain studies.  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  Since that time, patient had stopped drinking alcohol as advised.  She has been on diuretics and spironolactone without any improvement in symptoms.    CT of the chest on 07/04/2019 noted moderate pericardial effusion and airspace disease.  She underwent thoracentesis by PCCM which removed 700 mL of the fluid.  Patient underwent right heart cath on 07/06/2019 and pericardial window on 07/12/2019.  Attempts were made to diurese with IV diuretics but patient with worsening kidney function with creatinine up to 5.  Nephrology was formally consulted patient was started on dialysis.  She has been having poor p.o. intake so cortrak tube was placed.  She was then later placed on PEG tube for persistent poor p.o. intake, also patient developed oral pain after NG tube placement.  She is on hemodialysis for AKI with rapid progression to end-stage renal disease, severe anasarca, pericardial effusion, status post pericardial window.    Her labs showed RF positive at 71, INT RNA polymerase antibody +146 concern for scleroderma also she had some thickening of the skin with renal failure and pericardial involvement.  Pulmonary was consulted to evaluate for ILD. On  discussion with rheumatology at [facility] who recommended supportive care, acid reflux treated with PPI, joint pain Voltaren gel and Tylenol.  Prednisone taper.  For Raynaud's phenomenon calcium channel blocker.  If patient has interstitial lung disease then she should be evaluated for immunotherapy can be as outpatient when stronger.  Rheumatology did not recommend immunotherapy for just joint pain and skin manifestation.  Dr. Kathlene November, rheumatologist was consulted on phone.  He recommended to start high-dose IV Solu-Medrol 125 mg daily for 3 days and then prednisone 60 mg daily for week then prednisone 50 mg daily for a week then prednisone 40 mg daily for a week then prednisone 30 mg daily for a week which can be continued till seen by rheumatologist as outpatient.  Assessment/Plan:  Principal Problem:   Anasarca Active Problems:   Pericardial effusion   Hypertension   Elevated troponin   Atypical pneumonia   Hypoxia   Protein-calorie malnutrition, severe   Pressure injury of skin   Failure to thrive in adult   Goals of care, counseling/discussion   Dysphagia   Muscular weakness   Thrush of mouth and esophagus (HCC)   ESRD on hemodialysis (HCC)   Dyspnea   S/P thoracentesis   Atrial fibrillation with RVR (HCC)   AKI (acute kidney injury) ([place])   Scleroderma progressive (HCC)   Rheumatoid arthritis (HCC)   CREST syndrome (HCC)  Nausea, sickness during hemodialysis.  Patient states that she does not want to do hemodialysis as it is making her sick..  Explained the necessity of it.  Scleroderma /anasarca/joint pain/pericardial effusion-patient presented with anasarca, failure to thrive, joint pains, pericardial effusion, Raynaud's phenomenon.  She had positive anti-RNA polymerase 3 antibody.  Rheumatology evaluation in September 2020 showed CRP 13, ESR 25, ANA negative, c-ANCA/p-ANCA negative, scleroderma SCL 70 -, SSS (ro) negative, M spike negative electrophoresis, HIV  negative.  Double-stranded DNA negative, ANA negative, RF 71.   Rheumatology at [facility] recommended quick taper as we were considering scleroderma. Rheumatologist Dr. Kathlene November, who recommended high-dose steroids, patient started on Solu-Medrol 125 mg IV for 3 days then switch to  prednisone 60 mg daily for 2 weeks and then slow taper.  She has completed IV Solu-Medrol 125 mg for 3 days.   Plan is 60 mg prednisone daily for a week then wean down to 50 mg daily for a week then 40 mg daily for a week then 30 mg daily.  Patient can then continue with prednisone 30 mg daily and follow-up with rheumatology as outpatient.  Started prednisone 60-milligram daily for 1 week from 08/14/2019.  Raynaud's phenomenon/crest syndrome-patient has cold hands with peripheral cyanosis, likely due to underlying scleroderma.  Beta-blocker has been discontinued and switched to Cardizem. Vascular surgery has been consulted for this- impression is Raynaud's phenomena and no further intervention.  Atrial fibrillation RVR.  Patient was initially on beta-blocker.  Patient is not a good candidate for anticoagulation considering multiple comorbid conditions, pericardial window, ESRD and frailty.  Due to worsening Raynaud's phenomena, beta-blockers were changed to calcium channel blocker.  Gets tachycardic during hemodialysis  Leukopenia. improved. Oncology was consulted and does not recommend  search for malignancy.  CT chest  was negative, CT abdomen/pelvis on 1/16 was unremarkable , repeat SPEP showed no monoclonal protein with normal kappa lambda ratio.  She also had negative pleural fluid cytology x4.  Screening for other malignancy will be routine.  Most recent WBC count of 4.9.  No fever  Acute kidney injury now on hemodialysis due to progressive deterioration of renal function and anuria.  Nephrology on board.  On hemodialysis with improvement of anasarca.  Outpatient dialysis has been set up.  Continue hemodialysis Tuesday,  Thursday and Saturday.  Lethargy, metabolic encephalopathy-improved after hemodialysis and cutting down on narcotics. Ammonia level 29, Patient also received IV thiamine 250 mg 3 times daily for 3 days.  Acute hypoxic respiratory failure/pulmonary edema/pleural effusion-improved.  at this time. on room air currently. Underwent repeat thoracentesis on 08/01/2019.  Removed 1.1 L from left and 500 mL from right.  Pleural cytology showed reactive mesothelial cells.  Pleural fluid culture showed no growth.   Pericardial effusion with elevated troponin- Patient is status post pericardial window on 07/12/2019.  Pathology showed no malignancy, chronic inflammation, AFB negative.  Fungal culture  negative.  Atypical pneumonia with hypoxia-completed antibiotic treatment.  Currently on room air  Severe malnutrition/failure to thrive- seen by speech therapy and has been started on regular diet.  Has PEG tube feeding.  Dysphagia/thrush-group A strep was negative on 07/28/2019, patient currently on oral nystatin, oral Diflucan.  Anxiety-continue Zoloft, Xanax 0.25 mg 3 times daily as needed for panic attack.    Back pain-continue with oxycodone as needed, Neurontin, Voltaren gel twice a day.  Hypertension-continue Cardizem  Sacral decubitus ulcer stage II present on admission.  Continue wound care.  VTE Prophylaxis: Heparin subq  Code Status: Full code  Family Communication:  None today.   Disposition Plan:    Skilled nursing facility as per physical therapy recommendation. Continue hemodialysis. Patient's husband requesting her rehab evaluation.  Consultants:  Oncology  Nephrology  Palliative care  PMR  Procedures:  Hemodialysis  Thoracocentesis   right heart cath on 07/06/2019  pericardial window on 07/12/2019.  PEG tube placement  Antibiotics: None currently  Anti-infectives (From admission, onward)   Start     Dose/Rate Route Frequency Ordered Stop   07/29/19  1500  fluconazole (DIFLUCAN) tablet 200 mg     200 mg Oral Daily 07/29/19 1458 08/11/19 2116   07/28/19 1300  ceFAZolin (ANCEF) 2-4 GM/100ML-% IVPB    Note to Pharmacy: Kandy Garrison   : cabinet override      07/28/19 1300 07/28/19 1806   07/28/19 0700  ceFAZolin (ANCEF) IVPB 2g/100 mL premix     2 g 200 mL/hr over 30 Minutes Intravenous On call 07/27/19 1502 07/28/19 1340   07/18/19 1316  ceFAZolin (ANCEF) 2-4 GM/100ML-% IVPB    Note to Pharmacy: Manuela Neptune   : cabinet override      07/18/19 1316 07/18/19 1318   07/12/19 2115  ceFAZolin (ANCEF) IVPB 1 g/50 mL premix     1 g 100 mL/hr over 30 Minutes Intravenous Every 8 hours 07/12/19 2102 07/13/19 0516   07/03/19 2245  cefTRIAXone (ROCEPHIN) 2 g in sodium chloride 0.9 % 100 mL IVPB  Status:  Discontinued     2 g 200 mL/hr over 30 Minutes Intravenous Every 24 hours 07/03/19 2230 07/04/19 1336   07/03/19 2245  azithromycin (ZITHROMAX) 500 mg in sodium chloride 0.9 % 250 mL IVPB  Status:  Discontinued     500 mg 250 mL/hr over 60 Minutes Intravenous Every 24 hours 07/03/19 2230 07/05/19 1037     Subjective: Today, patient was seen and examined at bedside.  Complains of nausea and feeling sick.  Seen during hemodialysis.  Denies chest pain, palpitation, fever or chills.  Objective: Vitals:   08/19/19 0900 08/19/19 0930  BP: 119/88 108/73  Pulse: 78 (!) 113  Resp:    Temp:    SpO2:      Intake/Output Summary (Last 24 hours) at 08/19/2019 0955 Last data filed at 08/18/2019 2201 Gross per 24 hour  Intake 203 ml  Output --  Net 203 ml   Filed Weights   08/17/19 1217 08/19/19 0400 08/19/19 0748  Weight: 42.2 kg 39.9 kg 41.3 kg   Body mass index is 14.69 kg/m.   Physical Exam:  GENERAL: Patient is alert awake communicative.  Thinly built.  Deconditioned and frail.  Weak voice.  In mild distress HENT: No scleral pallor or icterus. Pupils equally reactive to light. Oral mucosa is moist.  Left internal jugular  hemodialysis catheter in place NECK: is supple, no palpable thyroid enlargement. CHEST: Clear to auscultation. No crackles or wheezes. Non tender on palpation. Diminished breath sounds bilaterally. CVS: S1 and S2 heard, no murmur.   Tachycardic no pericardial rub. ABDOMEN: Soft, non-tender, bowel sounds are present.  PEG tube in place with mild erythema at the site. EXTREMITIES: Cold hands, cyanosis of the digits noted CNS: Cranial nerves are intact. No focal motor or sensory deficits.  Denies weakness noted SKIN: warm and dry without rashes.  Cyanotic extremities.  Taut skin over the lower extremities.  Data Review: I have personally reviewed the following laboratory data and studies,  CBC: Recent Labs  Lab 08/15/19 0251 08/17/19 0303 08/18/19 0240 08/19/19 0650  WBC 8.2 6.4 6.7 4.9  HGB 12.9 13.3 12.4 12.6  HCT 43.9 45.2 42.7 42.9  MCV 94.0 93.0 93.8 93.1  PLT 174 119* 135* 854*   Basic Metabolic Panel: Recent Labs  Lab 08/15/19 0251 08/16/19 0257 08/17/19 0303 08/18/19 0240 08/19/19 0650  NA 135 134*  132* 133* 131*  K 5.2* 4.3 4.5 4.2 4.6  CL 93* 94* 92* 93* 87*  CO2 22 26 22 27  21*  GLUCOSE 89 98 97 124* 76  BUN 128* 56* 98* 59* 111*  CREATININE 3.50* 2.16* 3.23* 2.06* 3.22*  CALCIUM 9.5 9.1 9.5 9.0 9.2  MG  --   --   --  2.4  --   PHOS 4.9* 4.7* 6.8* 4.7* 6.1*   Liver Function Tests: Recent Labs  Lab 08/15/19 0251 08/16/19 0257 08/17/19 0303 08/18/19 0240 08/19/19 0650  AST  --   --   --  50*  --   ALT  --   --   --  22  --   ALKPHOS  --   --   --  104  --   BILITOT  --   --   --  0.4  --   PROT  --   --   --  5.4*  --   ALBUMIN 3.0* 3.0* 3.1* 3.0* 3.0*   No results for input(s): LIPASE, AMYLASE in the last 168 hours. No results for input(s): AMMONIA in the last 168 hours. Cardiac Enzymes: No results for input(s): CKTOTAL, CKMB, CKMBINDEX, TROPONINI in the last 168 hours. BNP (last 3 results) Recent Labs    04/04/19 2000 07/03/19 1930  07/09/19 0347  BNP 76.7 1,124.1* 4,004.9*    ProBNP (last 3 results) No results for input(s): PROBNP in the last 8760 hours.  CBG: Recent Labs  Lab 08/18/19 1557 08/18/19 1930 08/18/19 1933 08/18/19 2331 08/19/19 0356  GLUCAP 118* 33* 130* 92 121*   No results found for this or any previous visit (from the past 240 hour(s)).   Studies: No results found.  Scheduled Meds: . acetaminophen  500 mg Oral BID  . Chlorhexidine Gluconate Cloth  6 each Topical Q0600  . collagenase   Topical BID  . diclofenac Sodium  2 g Topical BID  . diltiazem  120 mg Oral Daily  . famotidine  10 mg Oral Daily  . feeding supplement (PRO-STAT SUGAR FREE 64)  30 mL Per Tube Daily  . ferric citrate  420 mg Oral TID WC  . gabapentin  100 mg Per Tube Q12H  . Gerhardt's butt cream   Topical TID  . heparin  5,000 Units Subcutaneous Q8H  . loratadine  10 mg Oral Daily  . mouth rinse  15 mL Mouth Rinse BID  . multivitamin  1 tablet Oral QHS  . neomycin-bacitracin-polymyxin   Topical BID  . nystatin  5 mL Mouth/Throat QID  . pantoprazole sodium  40 mg Per Tube BID  . predniSONE  60 mg Oral Q breakfast   Followed by  . [START ON 08/22/2019] predniSONE  50 mg Oral Q breakfast   Followed by  . [START ON 08/23/2019] predniSONE  40 mg Oral Q breakfast   Followed by  . [START ON 09/04/2019] predniSONE  30 mg Oral Q breakfast  . sertraline  25 mg Oral BID  . sodium chloride irrigation  15 mL Irrigation QID  . thiamine  100 mg Oral Daily    Continuous Infusions: . sodium chloride    . sodium chloride    . feeding supplement (NEPRO CARB STEADY) 1,000 mL (08/17/19 1821)    Flora Lipps, MD  [facility] 08/19/2019

## 2019-08-19 NOTE — Progress Notes (Signed)
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 Kipp Brood 07/12/2019.  Initial serological evaluation for autoimmune diseases was essentially negative, although she did have a positive anti-RNA polymerase 3 antibody.  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  Hematology raised the concern of a malignancy or scleroderma although an extensive search for malignancy was not recommended by Dr. Jana Hakim.  Initial plans were for transfer to tertiary center however this did not occur due to lack of bed availability.  She was initially started on dialysis 07/18/2019 due to edema that failed to resolve with IV diuretics and worsening serum creatinine.  She dialyzes with a tunneled dialysis catheter.  Patient tolerated dialysis 128 2021 with 1.6 L removed..  She is scheduled for dialysis 08/19/2019  Blood pressure 108/73 pulse 78 temperature 97.9 O2 sats understand room air  Sodium 131 potassium 4.6 chloride 87 CO2 21 BUN 111 creatinine 3.22 glucose 76 calcium 9.2 phosphorus 6.1 albumin 3.0 WBC 4.9 hemoglobin 12.6 platelets 107  Neurontin 100 mg twice daily Claritin 10 mg daily, Lopressor 25 mg twice daily, Protonix 40 mg twice daily, Zoloft 25 mg daily..  Prednisone taper starting at 60 mg.  08/15/2019.  Cardizem 120 mg daily  We will discontinue darbepoetin due to high hemoglobin  Objective:  Vital signs in last 24 hours:  Temp:  [97.1 F (36.2 C)-98.9 F (37.2 C)] 97.9 F (36.6 C) (01/30 0748) Pulse Rate:  [78-113] 113 (01/30 0930) Resp:  [11-29] 20 (01/30 0748) BP: (108-160)/(73-95) 108/73 (01/30 0930) SpO2:  [99 %-100 %] 100 %  (01/30 0748) Weight:  [39.9 kg-41.3 kg] 41.3 kg (01/30 0748)  Weight change: -1.361 kg Filed Weights   08/17/19 1217 08/19/19 0400 08/19/19 0748  Weight: 42.2 kg 39.9 kg 41.3 kg    Intake/Output: I/O last 3 completed shifts: In: 100 [P.O.:200; I.V.:3] Out: -    Intake/Output this shift:  No intake/output data recorded. Cachectic and frail CVS- RRR JVP not elevated left tunneled dialysis catheter RS- CTA no wheezes or rales ABD- BS present soft non-distended EXT- no edema   Basic Metabolic Panel: Recent Labs  Lab 08/15/19 0251 08/15/19 0251 08/16/19 0257 08/16/19 0257 08/17/19 0303 08/18/19 0240 08/19/19 0650  NA 135  --  134*  --  132* 133* 131*  K 5.2*  --  4.3  --  4.5 4.2 4.6  CL 93*  --  94*  --  92* 93* 87*  CO2 22  --  26  --  22 27 21*  GLUCOSE 89  --  98  --  97 124* 76  BUN 128*  --  56*  --  98* 59* 111*  CREATININE 3.50*  --  2.16*  --  3.23* 2.06* 3.22*  CALCIUM 9.5   < > 9.1   < > 9.5 9.0 9.2  MG  --   --   --   --   --  2.4  --   PHOS 4.9*  --  4.7*  --  6.8* 4.7* 6.1*   < > = values in this interval not displayed.    Liver Function Tests: Recent Labs  Lab 08/15/19 0251 08/16/19 0257 08/17/19 0303  08/18/19 0240 08/19/19 0650  AST  --   --   --  50*  --   ALT  --   --   --  22  --   ALKPHOS  --   --   --  104  --   BILITOT  --   --   --  0.4  --   PROT  --   --   --  5.4*  --   ALBUMIN 3.0* 3.0* 3.1* 3.0* 3.0*   No results for input(s): LIPASE, AMYLASE in the last 168 hours. No results for input(s): AMMONIA in the last 168 hours.  CBC: Recent Labs  Lab 08/15/19 0251 08/17/19 0303 08/18/19 0240 08/19/19 0650  WBC 8.2 6.4 6.7 4.9  HGB 12.9 13.3 12.4 12.6  HCT 43.9 45.2 42.7 42.9  MCV 94.0 93.0 93.8 93.1  PLT 174 119* 135* 107*    Cardiac Enzymes: No results for input(s): CKTOTAL, CKMB, CKMBINDEX, TROPONINI in the last 168 hours.  BNP: Invalid input(s): POCBNP  CBG: Recent Labs  Lab 08/18/19 1557 08/18/19 1930  08/18/19 1933 08/18/19 2331 08/19/19 0356  GLUCAP 118* 33* 130* 66 121*    Microbiology: Results for orders placed or performed during the hospital encounter of 07/03/19  Culture, blood (routine x 2)     Status: None   Collection Time: 07/03/19  7:28 PM   Specimen: BLOOD RIGHT FOREARM  Result Value Ref Range Status   Specimen Description BLOOD RIGHT FOREARM  Final   Special Requests   Final    BOTTLES DRAWN AEROBIC AND ANAEROBIC Blood Culture adequate volume   Culture   Final    NO GROWTH 5 DAYS Performed at [facility], [place] [address], [place], [place] [address]    Report Status 07/08/2019 FINAL  Final  Culture, blood (routine x 2)     Status: None   Collection Time: 07/03/19  7:34 PM   Specimen: BLOOD LEFT HAND  Result Value Ref Range Status   Specimen Description BLOOD LEFT HAND  Final   Special Requests AEROBIC BOTTLE ONLY Blood Culture adequate volume  Final   Culture   Final    NO GROWTH 5 DAYS Performed at [facility], [place] [address], [place], [place] [address]    Report Status 07/08/2019 FINAL  Final  SARS CORONAVIRUS 2 (TAT 6-24 HRS) Nasopharyngeal Nasopharyngeal Swab     Status: None   Collection Time: 07/03/19 10:11 PM   Specimen: Nasopharyngeal Swab  Result Value Ref Range Status   SARS Coronavirus 2 NEGATIVE NEGATIVE Final    Comment: (NOTE) SARS-CoV-2 target nucleic acids are NOT DETECTED. The SARS-CoV-2 RNA is generally detectable in upper and lower respiratory specimens during the acute phase of infection. Negative results do not preclude SARS-CoV-2 infection, do not rule out co-infections with other pathogens, and should not be used as the sole basis for treatment or other patient management decisions. Negative results must be combined with clinical observations, patient history, and epidemiological information. The expected result is Negative. Fact Sheet for Patients: SugarRoll.be Fact Sheet for Healthcare  Providers: https://www.woods-mathews.com/ This test is not yet approved or cleared by the [place] FDA and  has been authorized for detection and/or diagnosis of SARS-CoV-2 by FDA under an Emergency Use Authorization (EUA). This EUA will remain  in effect (meaning this test can be used) for the duration of the COVID-19 declaration under Section 56 4(b)(1) of the Act, 21 U.S.C. section 360bbb-3(b)(1), unless the authorization is  terminated or revoked sooner. Performed at [facility], [place] [address] [place], [place] [address]   Respiratory Panel by PCR     Status: None   Collection Time: 07/03/19 10:11 PM   Specimen: Nasopharyngeal Swab; Respiratory  Result Value Ref Range Status   Adenovirus NOT DETECTED NOT DETECTED Final   Coronavirus 229E NOT DETECTED NOT DETECTED Final    Comment: (NOTE) The Coronavirus on the Respiratory Panel, DOES NOT test for the novel  Coronavirus (2019 nCoV)    Coronavirus HKU1 NOT DETECTED NOT DETECTED Final   Coronavirus NL63 NOT DETECTED NOT DETECTED Final   Coronavirus OC43 NOT DETECTED NOT DETECTED Final   Metapneumovirus NOT DETECTED NOT DETECTED Final   Rhinovirus / Enterovirus NOT DETECTED NOT DETECTED Final   Influenza A NOT DETECTED NOT DETECTED Final   Influenza B NOT DETECTED NOT DETECTED Final   Parainfluenza Virus 1 NOT DETECTED NOT DETECTED Final   Parainfluenza Virus 2 NOT DETECTED NOT DETECTED Final   Parainfluenza Virus 3 NOT DETECTED NOT DETECTED Final   Parainfluenza Virus 4 NOT DETECTED NOT DETECTED Final   Respiratory Syncytial Virus NOT DETECTED NOT DETECTED Final   Bordetella pertussis NOT DETECTED NOT DETECTED Final   Chlamydophila pneumoniae NOT DETECTED NOT DETECTED Final   Mycoplasma pneumoniae NOT DETECTED NOT DETECTED Final    Comment: Performed at [facility], [place]. [address] [place], [place] [address]  Respiratory Panel by RT PCR (Flu A&B, Covid) - Nasopharyngeal Swab     Status: None    Collection Time: 07/04/19  1:11 AM   Specimen: Nasopharyngeal Swab  Result Value Ref Range Status   SARS Coronavirus 2 by RT PCR NEGATIVE NEGATIVE Final    Comment: (NOTE) SARS-CoV-2 target nucleic acids are NOT DETECTED. The SARS-CoV-2 RNA is generally detectable in upper respiratoy specimens during the acute phase of infection. The lowest concentration of SARS-CoV-2 viral copies this assay can detect is 131 copies/mL. A negative result does not preclude SARS-Cov-2 infection and should not be used as the sole basis for treatment or other patient management decisions. A negative result may occur with  improper specimen collection/handling, submission of specimen other than nasopharyngeal swab, presence of viral mutation(s) within the areas targeted by this assay, and inadequate number of viral copies (<131 copies/mL). A negative result must be combined with clinical observations, patient history, and epidemiological information. The expected result is Negative. Fact Sheet for Patients:  PinkCheek.be Fact Sheet for Healthcare Providers:  GravelBags.it This test is not yet ap proved or cleared by the [place] FDA and  has been authorized for detection and/or diagnosis of SARS-CoV-2 by FDA under an Emergency Use Authorization (EUA). This EUA will remain  in effect (meaning this test can be used) for the duration of the COVID-19 declaration under Section 564(b)(1) of the Act, 21 U.S.C. section 360bbb-3(b)(1), unless the authorization is terminated or revoked sooner.    Influenza A by PCR NEGATIVE NEGATIVE Final   Influenza B by PCR NEGATIVE NEGATIVE Final    Comment: (NOTE) The Xpert Xpress SARS-CoV-2/FLU/RSV assay is intended as an aid in  the diagnosis of influenza from Nasopharyngeal swab specimens and  should not be used as a sole basis for treatment. Nasal washings and  aspirates are unacceptable for Xpert Xpress  SARS-CoV-2/FLU/RSV  testing. Fact Sheet for Patients: PinkCheek.be Fact Sheet for Healthcare Providers: GravelBags.it This test is not yet approved or cleared by the [place] FDA and  has been authorized for detection and/or  diagnosis of SARS-CoV-2 by  FDA under an Emergency Use Authorization (EUA). This EUA will remain  in effect (meaning this test can be used) for the duration of the  Covid-19 declaration under Section 564(b)(1) of the Act, 21  U.S.C. section 360bbb-3(b)(1), unless the authorization is  terminated or revoked. Performed at [facility], [place] [address], [place], [place] [address]   MRSA PCR Screening     Status: None   Collection Time: 07/04/19  7:00 AM   Specimen: Nasopharyngeal  Result Value Ref Range Status   MRSA by PCR NEGATIVE NEGATIVE Final    Comment:        The GeneXpert MRSA Assay (FDA approved for NASAL specimens only), is one component of a comprehensive MRSA colonization surveillance program. It is not intended to diagnose MRSA infection nor to guide or monitor treatment for MRSA infections. Performed at [facility], [place] [address], [place], [place] [address]   Body fluid culture     Status: None   Collection Time: 07/04/19  1:23 PM   Specimen: Pleura; Body Fluid  Result Value Ref Range Status   Specimen Description PLEURAL RIGHT  Final   Special Requests NONE  Final   Gram Stain   Final    RARE WBC PRESENT, PREDOMINANTLY MONONUCLEAR NO ORGANISMS SEEN    Culture   Final    NO GROWTH Performed at [facility], [address] [place], [place] [address]    Report Status 07/07/2019 FINAL  Final  Body fluid culture (includes gram stain)     Status: None   Collection Time: 07/04/19  1:23 PM   Specimen: Pleural Fluid  Result Value Ref Range Status   Specimen Description PLEURAL LEFT  Final   Special Requests NONE  Final   Gram Stain   Final    RARE WBC  PRESENT, PREDOMINANTLY MONONUCLEAR NO ORGANISMS SEEN    Culture   Final    NO GROWTH Performed at [facility], [place] [address] [place], [place] [address]    Report Status 07/07/2019 FINAL  Final  Surgical pcr screen     Status: None   Collection Time: 07/12/19  3:31 PM   Specimen: Nasal Mucosa; Nasal Swab  Result Value Ref Range Status   MRSA, PCR NEGATIVE NEGATIVE Final   Staphylococcus aureus NEGATIVE NEGATIVE Final    Comment: (NOTE) The Xpert SA Assay (FDA approved for NASAL specimens in patients 65 years of age and older), is one component of a comprehensive surveillance program. It is not intended to diagnose infection nor to guide or monitor treatment. Performed at [facility], [place] [address], [place], [place] [address]   Fungus Culture With Stain     Status: None   Collection Time: 07/12/19  5:45 PM   Specimen: PATH Cytology Misc. fluid; Body Fluid  Result Value Ref Range Status   Fungus Stain Final report  Final   Fungus (Mycology) Culture Final report  Final    Comment: (NOTE) Performed At: [facility] [address] [place], [place] [id number] Rush Farmer MD [id number]    Fungal Source FLUID  Final    Comment: PERICARDIAL Performed at [facility], [place] [address], [place], [place] [address]   Aerobic/Anaerobic Culture (surgical/deep wound)     Status: None   Collection Time: 07/12/19  5:45 PM   Specimen: PATH Cytology Misc. fluid; Body Fluid  Result Value Ref Range Status   Specimen Description FLUID  PERICARDIAL  Final   Special Requests NONE  Final   Gram Stain NO WBC SEEN NO ORGANISMS SEEN   Final   Culture   Final    No growth aerobically or anaerobically. Performed at [facility], [place] [address], [place], [place] [address]    Report Status 07/17/2019 FINAL  Final  Acid Fast Smear (AFB)     Status: None   Collection Time: 07/12/19  5:45 PM   Specimen: PATH Cytology Misc. fluid; Body Fluid  Result Value Ref  Range Status   AFB Specimen Processing Concentration  Final   Acid Fast Smear Negative  Final    Comment: (NOTE) Performed At: [facility] [place] [facility] [place], [place] [id number] Rush Farmer MD [id number]    Source (AFB) FLUID  Final    Comment: PERICARDIAL Performed at [facility], [place] [address], [place], [place] [address]   Fungus Culture Result     Status: None   Collection Time: 07/12/19  5:45 PM  Result Value Ref Range Status   Result 1 Comment  Final    Comment: (NOTE) KOH/Calcofluor preparation:  no fungus observed. Performed At: [facility] [place], [place] [id number] Rush Farmer MD [id number]   Fungal organism reflex     Status: None   Collection Time: 07/12/19  5:45 PM  Result Value Ref Range Status   Fungal result 1 Comment  Final    Comment: (NOTE) No yeast or mold isolated after 4 weeks. Performed At: [facility] [place], [place] [id number] Rush Farmer MD [id number]   Culture, group A strep     Status: None   Collection Time: 07/26/19 12:21 PM   Specimen: Throat  Result Value Ref Range Status   Specimen Description THROAT  Final   Special Requests NONE  Final   Culture   Final    NO GROUP A STREP (S.PYOGENES) ISOLATED Performed at [facility], [place] [address] [place], [place] [address]    Report Status 07/28/2019 FINAL  Final    Coagulation Studies: No results for input(s): LABPROT, INR in the last 72 hours.  Urinalysis: No results for input(s): COLORURINE, LABSPEC, PHURINE, GLUCOSEU, HGBUR, BILIRUBINUR, KETONESUR, PROTEINUR, UROBILINOGEN, NITRITE, LEUKOCYTESUR in the last 72 hours.  Invalid input(s): APPERANCEUR    Imaging: No results found.   Medications:   . sodium chloride    . sodium chloride    . feeding supplement (NEPRO CARB STEADY) 1,000 mL (08/17/19 1821)   . acetaminophen  500 mg Oral BID  . Chlorhexidine Gluconate Cloth  6 each Topical  Q0600  . collagenase   Topical BID  . diclofenac Sodium  2 g Topical BID  . diltiazem  120 mg Oral Daily  . famotidine  10 mg Oral Daily  . feeding supplement (PRO-STAT SUGAR FREE 64)  30 mL Per Tube Daily  . ferric citrate  210 mg Oral TID WC  . gabapentin  100 mg Per Tube Q12H  . Gerhardt's butt cream   Topical TID  . heparin  5,000 Units Subcutaneous Q8H  . loratadine  10 mg Oral Daily  . mouth rinse  15 mL Mouth Rinse BID  . multivitamin  1 tablet Oral QHS  . neomycin-bacitracin-polymyxin   Topical BID  . nystatin  5 mL Mouth/Throat QID  . pantoprazole sodium  40 mg Per Tube BID  . predniSONE  60 mg Oral Q breakfast   Followed by  . [START  ON 08/22/2019] predniSONE  50 mg Oral Q breakfast   Followed by  . [START ON 09/10/2019] predniSONE  40 mg Oral Q breakfast   Followed by  . [START ON 09/15/2019] predniSONE  30 mg Oral Q breakfast  . sertraline  25 mg Oral BID  . sodium chloride irrigation  15 mL Irrigation QID  . thiamine  100 mg Oral Daily   sodium chloride, sodium chloride, acetaminophen (TYLENOL) oral liquid 160 mg/5 mL, ALPRAZolam, alteplase, alum & mag hydroxide-simeth, antiseptic oral rinse, bisacodyl, bisacodyl, camphor-menthol, heparin, hydrOXYzine, ondansetron (ZOFRAN) IV, oxyCODONE, polyethylene glycol, polyvinyl alcohol, senna-docusate, witch hazel-glycerin  Assessment/ Plan:   Acute kidney injury with a creatinine of 1.43 07/03/2019 thought to be hemodynamically mediated possible hepatorenal syndrome with concomitant use of IV contrast urinalysis was unremarkable autoimmune work-up has been unremarkable however there is some consideration of scleroderma.  She was initiated on dialysis due to progressive loss of renal function and she is anuric this was started 07/18/2019.  She no longer has a slot at [facility] for dialysis. We will continue on dialysis 08/19/2019  Anemia ESA was held 08/11/2019.  Will discontinue darbepoetin  Malnutrition low albumin  Bones PTH 76  08/18/2019 started Auryxia 210 mg 1 with meals will increase to 2 with meals  Moderate to large pericardial effusion status post pericardial window 08/11/2018  Liver disease diagnosed in [facility] with history of steatosis.  Scleroderma with crest syndrome.  Initiated on prednisone taper starting at 60 mg daily 08/15/2019  Paroxysmal atrial fibrillation   appreciate assistance from Dr. Davina Poke.  No anticoagulation for now due to high risk of bleeding recent pericardial effusion and window.  Patient awaiting possible transfer to [facility] to [place].  Palliative care following   LOS: [place] @TODAY @9 :39 AM

## 2019-08-20 LAB — RENAL FUNCTION PANEL
Albumin: 3 g/dL — ABNORMAL LOW (ref 3.5–5.0)
Anion gap: 15 (ref 5–15)
BUN: 58 mg/dL — ABNORMAL HIGH (ref 8–23)
CO2: 24 mmol/L (ref 22–32)
Calcium: 9.2 mg/dL (ref 8.9–10.3)
Chloride: 94 mmol/L — ABNORMAL LOW (ref 98–111)
Creatinine, Ser: 2 mg/dL — ABNORMAL HIGH (ref 0.44–1.00)
GFR calc Af Amer: 29 mL/min — ABNORMAL LOW (ref >60)
GFR calc non Af Amer: 25 mL/min — ABNORMAL LOW (ref >60)
Glucose, Bld: 116 mg/dL — ABNORMAL HIGH (ref 70–99)
Phosphorus: 3.4 mg/dL (ref 2.5–4.6)
Potassium: 4.2 mmol/L (ref 3.5–5.1)
Sodium: 133 mmol/L — ABNORMAL LOW (ref 135–145)

## 2019-08-20 LAB — GLUCOSE, CAPILLARY
Glucose-Capillary: 124 mg/dL — ABNORMAL HIGH (ref 70–99)
Glucose-Capillary: 110 mg/dL — ABNORMAL HIGH (ref 70–99)
Glucose-Capillary: 126 mg/dL — ABNORMAL HIGH (ref 70–99)
Glucose-Capillary: 113 mg/dL — ABNORMAL HIGH (ref 70–99)
Glucose-Capillary: 74 mg/dL (ref 70–99)
Glucose-Capillary: 111 mg/dL — ABNORMAL HIGH (ref 70–99)

## 2019-08-20 NOTE — Plan of Care (Signed)
  Problem: Education: Goal: Knowledge of General Education information will improve Description: Including pain rating scale, medication(s)/side effects and non-pharmacologic comfort measures Outcome: Progressing   Problem: Health Behavior/Discharge Planning: Goal: Ability to manage health-related needs will improve Outcome: Not Progressing   Problem: Clinical Measurements: Goal: Ability to maintain clinical measurements within normal limits will improve Outcome: Progressing Goal: Will remain free from infection Outcome: Progressing Goal: Diagnostic test results will improve Outcome: Not Progressing Goal: Respiratory complications will improve Outcome: Progressing Goal: Cardiovascular complication will be avoided Outcome: Progressing   Problem: Activity: Goal: Risk for activity intolerance will decrease Outcome: Not Progressing   Problem: Nutrition: Goal: Adequate nutrition will be maintained Outcome: Not Progressing   Problem: Coping: Goal: Level of anxiety will decrease Outcome: Not Progressing   Problem: Elimination: Goal: Will not experience complications related to bowel motility Outcome: Progressing Goal: Will not experience complications related to urinary retention Outcome: Not Progressing   Problem: Pain Managment: Goal: General experience of comfort will improve Outcome: Not Progressing

## 2019-08-20 NOTE — Progress Notes (Signed)
Patient up in chair today for 1.5 hours, tolerated well, husband brought food from home that patient ate 7-8 bites. Husband at bedside.

## 2019-08-20 NOTE — Progress Notes (Signed)
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 Kipp Brood 07/12/2019.  Initial serological evaluation for autoimmune diseases was essentially negative, although she did have a positive anti-RNA polymerase 3 antibody.  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  Hematology raised the concern of a malignancy or scleroderma although an extensive search for malignancy was not recommended by Dr. Jana Hakim.  Initial plans were for transfer to tertiary center however this did not occur due to lack of bed availability.  She was initially started on dialysis 07/18/2019 due to edema that failed to resolve with IV diuretics and worsening serum creatinine.  She dialyzes with a tunneled dialysis catheter.  Patient tolerated dialysis 08/19/2019 with removal of 1.9 L  Blood pressure 138/84 pulse 118 temperature 97.5 O2 sats 100% room air  Sodium 133 potassium 4.0 chloride 94 CO2 24 BUN 58 creatinine 2 glucose 116 calcium 9.2 phosphorus 3.4 albumin 3 hemoglobin 12.6 platelets 107  Neurontin 100 mg twice daily Claritin 10 mg daily,, Protonix 40 mg twice daily, Zoloft 25 mg daily..  Prednisone taper starting at 60 mg.  08/15/2019. Cardizem 120 mg daily   darbepoetin discontinued due to high hemoglobin  Objective:  Vital signs in last 24 hours:  Temp:  [97.5 F (36.4 C)-97.9 F (36.6 C)] 97.5 F (36.4 C) (01/31 0316) Pulse Rate:  [77-113] 94 (01/31 0610) Resp:  [15-30] 20 (01/31 0619) BP: (108-161)/(55-93) 140/71 (01/31 0610) SpO2:  [98 %-100 %] 100 % (01/31 0610) Weight:  [39 kg-41.7 kg] 41.7 kg (01/31 0316)  Weight change: 1.361 kg Filed  Weights   08/19/19 0748 08/19/19 1153 08/20/19 0316  Weight: 41.3 kg 39 kg 41.7 kg    Intake/Output: I/O last 3 completed shifts: In: 3 [I.V.:3] Out: 1903 [SWFUX:[id number]]   Intake/Output this shift:  No intake/output data recorded. Cachectic and frail CVS- RRR JVP not elevated left tunneled dialysis catheter RS- CTA no wheezes or rales ABD- BS present soft non-distended EXT- no edema   Basic Metabolic Panel: Recent Labs  Lab 08/16/19 0257 08/16/19 0257 08/17/19 0303 08/17/19 0303 08/18/19 0240 08/19/19 0650 08/20/19 0302  NA 134*  --  132*  --  133* 131* 133*  K 4.3  --  4.5  --  4.2 4.6 4.2  CL 94*  --  92*  --  93* 87* 94*  CO2 26  --  22  --  27 21* 24  GLUCOSE 98  --  97  --  124* 76 116*  BUN 56*  --  98*  --  59* 111* 58*  CREATININE 2.16*  --  3.23*  --  2.06* 3.22* 2.00*  CALCIUM 9.1   < > 9.5   < > 9.0 9.2 9.2  MG  --   --   --   --  2.4  --   --   PHOS 4.7*  --  6.8*  --  4.7* 6.1* 3.4   < > = values in this interval not displayed.    Liver Function Tests: Recent Labs  Lab 08/16/19 0257 08/17/19 0303 08/18/19 0240 08/19/19 0650 08/20/19 0302  AST  --   --  50*  --   --  ALT  --   --  22  --   --   ALKPHOS  --   --  104  --   --   BILITOT  --   --  0.4  --   --   PROT  --   --  5.4*  --   --   ALBUMIN 3.0* 3.1* 3.0* 3.0* 3.0*   No results for input(s): LIPASE, AMYLASE in the last 168 hours. No results for input(s): AMMONIA in the last 168 hours.  CBC: Recent Labs  Lab 08/15/19 0251 08/17/19 0303 08/18/19 0240 08/19/19 0650  WBC 8.2 6.4 6.7 4.9  HGB 12.9 13.3 12.4 12.6  HCT 43.9 45.2 42.7 42.9  MCV 94.0 93.0 93.8 93.1  PLT 174 119* 135* 107*    Cardiac Enzymes: No results for input(s): CKTOTAL, CKMB, CKMBINDEX, TROPONINI in the last 168 hours.  BNP: Invalid input(s): POCBNP  CBG: Recent Labs  Lab 08/19/19 0356 08/19/19 1557 08/19/19 1932 08/19/19 2313 08/20/19 0308  GLUCAP 121* 126* 131* 128* 15*     Microbiology: Results for orders placed or performed during the hospital encounter of 07/03/19  Culture, blood (routine x 2)     Status: None   Collection Time: 07/03/19  7:28 PM   Specimen: BLOOD RIGHT FOREARM  Result Value Ref Range Status   Specimen Description BLOOD RIGHT FOREARM  Final   Special Requests   Final    BOTTLES DRAWN AEROBIC AND ANAEROBIC Blood Culture adequate volume   Culture   Final    NO GROWTH 5 DAYS Performed at [facility], [place] [address] [place], [place] [address]    Report Status 07/08/2019 FINAL  Final  Culture, blood (routine x 2)     Status: None   Collection Time: 07/03/19  7:34 PM   Specimen: BLOOD LEFT HAND  Result Value Ref Range Status   Specimen Description BLOOD LEFT HAND  Final   Special Requests AEROBIC BOTTLE ONLY Blood Culture adequate volume  Final   Culture   Final    NO GROWTH 5 DAYS Performed at [facility], [place] [address], [place], [place] [address]    Report Status 07/08/2019 FINAL  Final  SARS CORONAVIRUS 2 (TAT 6-24 HRS) Nasopharyngeal Nasopharyngeal Swab     Status: None   Collection Time: 07/03/19 10:11 PM   Specimen: Nasopharyngeal Swab  Result Value Ref Range Status   SARS Coronavirus 2 NEGATIVE NEGATIVE Final    Comment: (NOTE) SARS-CoV-2 target nucleic acids are NOT DETECTED. The SARS-CoV-2 RNA is generally detectable in upper and lower respiratory specimens during the acute phase of infection. Negative results do not preclude SARS-CoV-2 infection, do not rule out co-infections with other pathogens, and should not be used as the sole basis for treatment or other patient management decisions. Negative results must be combined with clinical observations, patient history, and epidemiological information. The expected result is Negative. Fact Sheet for Patients: SugarRoll.be Fact Sheet for Healthcare Providers: https://www.woods-mathews.com/ This test is not yet  approved or cleared by the [place] FDA and  has been authorized for detection and/or diagnosis of SARS-CoV-2 by FDA under an Emergency Use Authorization (EUA). This EUA will remain  in effect (meaning this test can be used) for the duration of the COVID-19 declaration under Section 56 4(b)(1) of the Act, 21 U.S.C. section 360bbb-3(b)(1), unless the authorization is terminated or revoked sooner. Performed at [facility], [place] [address], [place], [place] [address]   Respiratory  Panel by PCR     Status: None   Collection Time: 07/03/19 10:11 PM   Specimen: Nasopharyngeal Swab; Respiratory  Result Value Ref Range Status   Adenovirus NOT DETECTED NOT DETECTED Final   Coronavirus 229E NOT DETECTED NOT DETECTED Final    Comment: (NOTE) The Coronavirus on the Respiratory Panel, DOES NOT test for the novel  Coronavirus (2019 nCoV)    Coronavirus HKU1 NOT DETECTED NOT DETECTED Final   Coronavirus NL63 NOT DETECTED NOT DETECTED Final   Coronavirus OC43 NOT DETECTED NOT DETECTED Final   Metapneumovirus NOT DETECTED NOT DETECTED Final   Rhinovirus / Enterovirus NOT DETECTED NOT DETECTED Final   Influenza A NOT DETECTED NOT DETECTED Final   Influenza B NOT DETECTED NOT DETECTED Final   Parainfluenza Virus 1 NOT DETECTED NOT DETECTED Final   Parainfluenza Virus 2 NOT DETECTED NOT DETECTED Final   Parainfluenza Virus 3 NOT DETECTED NOT DETECTED Final   Parainfluenza Virus 4 NOT DETECTED NOT DETECTED Final   Respiratory Syncytial Virus NOT DETECTED NOT DETECTED Final   Bordetella pertussis NOT DETECTED NOT DETECTED Final   Chlamydophila pneumoniae NOT DETECTED NOT DETECTED Final   Mycoplasma pneumoniae NOT DETECTED NOT DETECTED Final    Comment: Performed at [facility], [address], [place], [place] [address]  Respiratory Panel by RT PCR (Flu A&B, Covid) - Nasopharyngeal Swab     Status: None   Collection Time: 07/04/19  1:11 AM   Specimen: Nasopharyngeal Swab  Result  Value Ref Range Status   SARS Coronavirus 2 by RT PCR NEGATIVE NEGATIVE Final    Comment: (NOTE) SARS-CoV-2 target nucleic acids are NOT DETECTED. The SARS-CoV-2 RNA is generally detectable in upper respiratoy specimens during the acute phase of infection. The lowest concentration of SARS-CoV-2 viral copies this assay can detect is 131 copies/mL. A negative result does not preclude SARS-Cov-2 infection and should not be used as the sole basis for treatment or other patient management decisions. A negative result may occur with  improper specimen collection/handling, submission of specimen other than nasopharyngeal swab, presence of viral mutation(s) within the areas targeted by this assay, and inadequate number of viral copies (<131 copies/mL). A negative result must be combined with clinical observations, patient history, and epidemiological information. The expected result is Negative. Fact Sheet for Patients:  PinkCheek.be Fact Sheet for Healthcare Providers:  GravelBags.it This test is not yet ap proved or cleared by the [place] FDA and  has been authorized for detection and/or diagnosis of SARS-CoV-2 by FDA under an Emergency Use Authorization (EUA). This EUA will remain  in effect (meaning this test can be used) for the duration of the COVID-19 declaration under Section 564(b)(1) of the Act, 21 U.S.C. section 360bbb-3(b)(1), unless the authorization is terminated or revoked sooner.    Influenza A by PCR NEGATIVE NEGATIVE Final   Influenza B by PCR NEGATIVE NEGATIVE Final    Comment: (NOTE) The Xpert Xpress SARS-CoV-2/FLU/RSV assay is intended as an aid in  the diagnosis of influenza from Nasopharyngeal swab specimens and  should not be used as a sole basis for treatment. Nasal washings and  aspirates are unacceptable for Xpert Xpress SARS-CoV-2/FLU/RSV  testing. Fact Sheet for  Patients: PinkCheek.be Fact Sheet for Healthcare Providers: GravelBags.it This test is not yet approved or cleared by the [place] FDA and  has been authorized for detection and/or diagnosis of SARS-CoV-2 by  FDA under an Emergency Use Authorization (EUA). This EUA will remain  in effect (meaning  this test can be used) for the duration of the  Covid-19 declaration under Section 564(b)(1) of the Act, 21  U.S.C. section 360bbb-3(b)(1), unless the authorization is  terminated or revoked. Performed at [facility], [place] [address] [place], [place] [address]   MRSA PCR Screening     Status: None   Collection Time: 07/04/19  7:00 AM   Specimen: Nasopharyngeal  Result Value Ref Range Status   MRSA by PCR NEGATIVE NEGATIVE Final    Comment:        The GeneXpert MRSA Assay (FDA approved for NASAL specimens only), is one component of a comprehensive MRSA colonization surveillance program. It is not intended to diagnose MRSA infection nor to guide or monitor treatment for MRSA infections. Performed at [facility], [place] [address] [place], [place] [address]   Body fluid culture     Status: None   Collection Time: 07/04/19  1:23 PM   Specimen: Pleura; Body Fluid  Result Value Ref Range Status   Specimen Description PLEURAL RIGHT  Final   Special Requests NONE  Final   Gram Stain   Final    RARE WBC PRESENT, PREDOMINANTLY MONONUCLEAR NO ORGANISMS SEEN    Culture   Final    NO GROWTH Performed at [facility], [address] [place], [place] [address]    Report Status 07/07/2019 FINAL  Final  Body fluid culture (includes gram stain)     Status: None   Collection Time: 07/04/19  1:23 PM   Specimen: Pleural Fluid  Result Value Ref Range Status   Specimen Description PLEURAL LEFT  Final   Special Requests NONE  Final   Gram Stain   Final    RARE WBC PRESENT, PREDOMINANTLY MONONUCLEAR NO ORGANISMS  SEEN    Culture   Final    NO GROWTH Performed at [facility], [place] [address] [place], [place] [address]    Report Status 07/07/2019 FINAL  Final  Surgical pcr screen     Status: None   Collection Time: 07/12/19  3:31 PM   Specimen: Nasal Mucosa; Nasal Swab  Result Value Ref Range Status   MRSA, PCR NEGATIVE NEGATIVE Final   Staphylococcus aureus NEGATIVE NEGATIVE Final    Comment: (NOTE) The Xpert SA Assay (FDA approved for NASAL specimens in patients 54 years of age and older), is one component of a comprehensive surveillance program. It is not intended to diagnose infection nor to guide or monitor treatment. Performed at [facility], [place] [address], [place], [place] [address]   Fungus Culture With Stain     Status: None   Collection Time: 07/12/19  5:45 PM   Specimen: PATH Cytology Misc. fluid; Body Fluid  Result Value Ref Range Status   Fungus Stain Final report  Final   Fungus (Mycology) Culture Final report  Final    Comment: (NOTE) Performed At: [facility] [address] [place], [place] [id number] Rush Farmer MD [id number]    Fungal Source FLUID  Final    Comment: PERICARDIAL Performed at [facility], [place] [address] [place],  [address]   Aerobic/Anaerobic Culture (surgical/deep wound)     Status: None   Collection Time: 07/12/19  5:45 PM   Specimen: PATH Cytology Misc. fluid; Body Fluid  Result Value Ref Range Status   Specimen Description FLUID PERICARDIAL  Final   Special Requests NONE  Final   Gram Stain NO WBC SEEN NO ORGANISMS SEEN  Final   Culture   Final    No growth aerobically or anaerobically. Performed at [facility], [place] [address] [place], [place] [address]    Report Status 07/17/2019 FINAL  Final  Acid Fast Smear (AFB)     Status: None   Collection Time: 07/12/19  5:45 PM   Specimen: PATH Cytology Misc. fluid; Body Fluid  Result Value Ref Range Status   AFB Specimen Processing  Concentration  Final   Acid Fast Smear Negative  Final    Comment: (NOTE) Performed At: [facility] [place], [place] [id number] Rush Farmer MD QI:[id number]    Source (AFB) FLUID  Final    Comment: PERICARDIAL Performed at [facility], [place] [address] [place], [place] [address]   Fungus Culture Result     Status: None   Collection Time: 07/12/19  5:45 PM  Result Value Ref Range Status   Result 1 Comment  Final    Comment: (NOTE) KOH/Calcofluor preparation:  no fungus observed. Performed At: [facility] [place], [place] [id number] Rush Farmer MD [id number]   Fungal organism reflex     Status: None   Collection Time: 07/12/19  5:45 PM  Result Value Ref Range Status   Fungal result 1 Comment  Final    Comment: (NOTE) No yeast or mold isolated after 4 weeks. Performed At: [facility] [place], [place] [id number] Rush Farmer MD OI:[id number]   Culture, group A strep     Status: None   Collection Time: 07/26/19 12:21 PM   Specimen: Throat  Result Value Ref Range Status   Specimen Description THROAT  Final   Special Requests NONE  Final   Culture   Final    NO GROUP A STREP (S.PYOGENES) ISOLATED Performed at [facility], [place] [address], [place], [place] [address]    Report Status 07/28/2019 FINAL  Final    Coagulation Studies: No results for input(s): LABPROT, INR in the last 72 hours.  Urinalysis: No results for input(s): COLORURINE, LABSPEC, PHURINE, GLUCOSEU, HGBUR, BILIRUBINUR, KETONESUR, PROTEINUR, UROBILINOGEN, NITRITE, LEUKOCYTESUR in the last 72 hours.  Invalid input(s): APPERANCEUR    Imaging: No results found.   Medications:   . sodium chloride    . sodium chloride    . feeding supplement (NEPRO CARB STEADY) 1,000 mL (08/19/19 1612)   . acetaminophen  500 mg Oral BID  . Chlorhexidine Gluconate Cloth  6 each Topical Q0600  . collagenase   Topical BID  .  diclofenac Sodium  2 g Topical BID  . diltiazem  120 mg Oral Daily  . famotidine  10 mg Oral Daily  . feeding supplement (PRO-STAT SUGAR FREE 64)  30 mL Per Tube Daily  . ferric citrate  420 mg Oral TID WC  . gabapentin  100 mg Per Tube Q12H  . Gerhardt's butt cream   Topical TID  . heparin  5,000 Units Subcutaneous Q8H  . loratadine  10 mg Oral Daily  . mouth rinse  15 mL Mouth Rinse BID  . multivitamin  1 tablet Oral QHS  . neomycin-bacitracin-polymyxin   Topical BID  . nystatin  5 mL Mouth/Throat QID  . pantoprazole sodium  40 mg Per Tube BID  . predniSONE  60 mg Oral Q breakfast   Followed by  . [START ON 08/22/2019] predniSONE  50 mg Oral Q breakfast   Followed by  . [START ON 08/26/2019] predniSONE  40 mg  Oral Q breakfast   Followed by  . [START ON 08/28/2019] predniSONE  30 mg Oral Q breakfast  . sertraline  25 mg Oral BID  . sodium chloride irrigation  15 mL Irrigation QID  . thiamine  100 mg Oral Daily   sodium chloride, sodium chloride, acetaminophen (TYLENOL) oral liquid 160 mg/5 mL, ALPRAZolam, alum & mag hydroxide-simeth, antiseptic oral rinse, bisacodyl, bisacodyl, camphor-menthol, heparin, hydrOXYzine, ondansetron (ZOFRAN) IV, oxyCODONE, polyethylene glycol, polyvinyl alcohol, senna-docusate, witch hazel-glycerin  Assessment/ Plan:   Acute kidney injury with a creatinine of 1.43 07/03/2019 thought to be hemodynamically mediated possible hepatorenal syndrome with concomitant use of IV contrast urinalysis was unremarkable autoimmune work-up has been unremarkable however there is some consideration of scleroderma.  She was initiated on dialysis due to progressive loss of renal function and she is anuric this was started 07/18/2019.  She no longer has a slot at [facility] for [facility]. We will continue on dialysis 08/19/2019  Anemia ESA was held 08/11/2019.  Will discontinue darbepoetin  Malnutrition low albumin.  PEG tube placed by interventional radiology 07/28/2019  Bones PTH  76 08/18/2019 started Auryxia 210 mg 1 with meals will increase to 2 with meals.  Phosphorus appears to be improved at 3.4 we will continue to follow  Moderate to large pericardial effusion status post pericardial window 08/11/2018  Acute hypoxic respiratory failure improved currently on room air underwent repeat thoracentesis 08/01/2019 with removal of 1.1 L from the left and 500 cc from the right cytology showing reactive mesothelial cells and no growth  Liver disease diagnosed in [facility] with history of steatosis.  Scleroderma with crest syndrome.  Initiated on prednisone taper starting at 60 mg daily 08/15/2019  Raynaud's phenomenon   beta-blockers discontinued  Paroxysmal atrial fibrillation   appreciate assistance from Dr. Davina Poke.  No anticoagulation for now due to high risk of bleeding recent pericardial effusion and window.    Palliative care following   LOS: [place] @TODAY @7 :36 AM

## 2019-08-20 NOTE — Progress Notes (Signed)
PROGRESS NOTE  Joanna Ali NMM:[id number] DOB: 1950/09/22 DOA: 07/03/2019 PCP: Renaldo Reel, DO   LOS: 47 days   Brief narrative: As per HPI,  69 year old female with a history of hypertension, chronic pericardial effusion, moderate aortic valve regurgitation, former smoker, alcohol abuse initially admitted on 07/06/2019 for progressive shortness of breath with anasarca and fatigue.  She was previously worked up by [facility] rheumatology with negative work-up including ESR, lupus anticoagulant, rheumatoid factor, TSH, ANCA, ANA, light free chain studies.  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  Since that time, patient had stopped drinking alcohol as advised.  She has been on diuretics and spironolactone without any improvement in symptoms.    CT of the chest on 07/04/2019 noted moderate pericardial effusion and airspace disease.  She underwent thoracentesis by PCCM which removed 700 mL of the fluid.  Patient underwent right heart cath on 07/06/2019 and pericardial window on 07/12/2019.  Attempts were made to diurese with IV diuretics but patient with worsening kidney function with creatinine up to 5.  Nephrology was formally consulted patient was started on dialysis.  She has been having poor p.o. intake so cortrak tube was placed.  She was then later placed on PEG tube for persistent poor p.o. intake, also patient developed oral pain after NG tube placement.  She is on hemodialysis for AKI with rapid progression to end-stage renal disease, severe anasarca, pericardial effusion, status post pericardial window.    Her labs showed RF positive at 71, INT RNA polymerase antibody +146 concern for scleroderma also she had some thickening of the skin with renal failure and pericardial involvement.  Pulmonary was consulted to evaluate for ILD. On  discussion with rheumatology at [facility] who recommended supportive care, acid reflux treated with PPI, joint pain Voltaren gel and Tylenol.  Prednisone taper.  For Raynaud's phenomenon calcium channel blocker.  If patient has interstitial lung disease then she should be evaluated for immunotherapy can be as outpatient when stronger.  Rheumatology did not recommend immunotherapy for just joint pain and skin manifestation.  Dr. Kathlene November, rheumatologist was consulted on phone.  He recommended to start high-dose IV Solu-Medrol 125 mg daily for 3 days and then prednisone 60 mg daily for week then prednisone 50 mg daily for a week then prednisone 40 mg daily for a week then prednisone 30 mg daily for a week which can be continued till seen by rheumatologist as outpatient.  Assessment/Plan:  Principal Problem:   Anasarca Active Problems:   Pericardial effusion   Hypertension   Elevated troponin   Atypical pneumonia   Hypoxia   Protein-calorie malnutrition, severe   Pressure injury of skin   Failure to thrive in adult   Goals of care, counseling/discussion   Dysphagia   Muscular weakness   Thrush of mouth and esophagus (HCC)   ESRD on hemodialysis (HCC)   Dyspnea   S/P thoracentesis   Atrial fibrillation with RVR (HCC)   AKI (acute kidney injury) ([place])   Scleroderma progressive (HCC)   Rheumatoid arthritis (HCC)   CREST syndrome (HCC)  Nausea, sickness during hemodialysis.  Improved. None today.  Try Xanax prior to hemodialysis which seems to help  Anxiety.  As needed Xanax.  Scleroderma /anasarca/joint pain/pericardial effusion-patient presented with anasarca, failure to thrive, joint pains, pericardial effusion, Raynaud's phenomenon.  She had positive anti-RNA polymerase 3 antibody.  Rheumatology evaluation in September 2020 showed CRP 13, ESR 25, ANA negative, c-ANCA/p-ANCA negative, scleroderma SCL 70 -, SSS (ro) negative, M spike negative electrophoresis, HIV negative.   Double-stranded DNA negative, ANA negative, RF 71.   . Rheumatologist Dr. Kathlene November,  recommended d IV Solu-Medrol 125 mg for 3 day followed by 60 mg prednisone daily for a week then wean down to 50 mg daily for a week then 40 mg daily for a week then 30 mg daily.  Patient can then continue with prednisone 30 mg daily and follow-up with rheumatology as outpatient.  On prednisone 60-milligram daily for 1 week from 08/14/2019.  Raynaud's phenomenon/crest syndrome with cold hands with peripheral cyanosis, likely due to underlying scleroderma.  Beta-blocker has been discontinued and switched to Cardizem. Vascular surgery has been consulted for this- impression is Raynaud's phenomena and no further intervention is planned.  Atrial fibrillation RVR.  Marland Kitchen  Patient is not a good candidate for anticoagulation considering multiple comorbid conditions, pericardial window, ESRD and frailty.  Due to worsening Raynaud's phenomena, beta-blockers were changed to calcium channel blocker.    Leukopenia. improved. Oncology was consulted and does not recommend  search for malignancy.  CT chest  was negative, CT abdomen/pelvis on 1/16 was unremarkable , repeat SPEP showed no monoclonal protein with normal kappa lambda ratio.  She also had negative pleural fluid cytology x4.  Screening for other malignancy will be routine.  Most recent WBC count of 4.9.  No fever  Acute kidney injury now on hemodialysis due to progressive deterioration of renal function and anuria.  Nephrology on board.  On hemodialysis.  Continue hemodialysis Tuesday, Thursday and Saturday.  Lethargy, metabolic encephalopathy-improved after hemodialysis and cutting down on narcotics. Patient also received IV thiamine  Acute hypoxic respiratory failure/pulmonary edema/pleural effusion-improved. On room air currently. Underwent repeat thoracentesis on 08/01/2019.  Removed 1.1 L from left and 500 mL from right.  Pleural cytology showed reactive mesothelial cells.   Pleural fluid culture showed no growth.   Pericardial effusion with elevated troponin- Patient is status post pericardial window on 07/12/2019.  Pathology showed no malignancy, chronic inflammation, AFB negative.  Fungal culture  Negative. Cardiology on board.  Atypical pneumonia with hypoxia-completed antibiotic treatment.  Currently on room air  Severe malnutrition/failure to thrive- seen by speech therapy and has been started on regular diet.    Dysphagia/thrush-group A strep was negative on 07/28/2019, patient currently on oral nystatin, oral Diflucan.  Anxiety-continue Zoloft, Xanax 0.25 mg 3 times daily as needed for panic attack.    Back pain-continue with oxycodone as needed, Neurontin, Voltaren gel twice a day.  Hypertension-continue Cardizem  Sacral decubitus ulcer stage II present on admission.  Continue wound care.  VTE Prophylaxis: Heparin subq  Code Status: Full code  Family Communication:   None today.  Disposition Plan:    Skilled nursing facility as per physical therapy recommendation. Continue hemodialysis. PMR consulted for possible CIR, to be followed on Monday.  Consultants:  Oncology  Nephrology  Palliative care  PMR  Procedures:  Hemodialysis  Thoracocentesis   right heart cath on 07/06/2019    pericardial window on 07/12/2019.  PEG tube placement  Antibiotics: None currently  Anti-infectives (From admission, onward)   Start     Dose/Rate Route Frequency Ordered Stop   07/29/19 1500  fluconazole (DIFLUCAN) tablet 200 mg     200 mg Oral Daily 07/29/19 1458 08/11/19 2116   07/28/19 1300  ceFAZolin (ANCEF) 2-4 GM/100ML-% IVPB    Note to Pharmacy:  Kandy Garrison   : cabinet override      07/28/19 1300 07/28/19 1806   07/28/19 0700  ceFAZolin (ANCEF) IVPB 2g/100 mL premix     2 g 200 mL/hr over 30 Minutes Intravenous On call 07/27/19 1502 07/28/19 1340   07/18/19 1316  ceFAZolin (ANCEF) 2-4 GM/100ML-% IVPB    Note to  Pharmacy: Manuela Neptune   : cabinet override      07/18/19 1316 07/18/19 1318   07/12/19 2115  ceFAZolin (ANCEF) IVPB 1 g/50 mL premix     1 g 100 mL/hr over 30 Minutes Intravenous Every 8 hours 07/12/19 2102 07/13/19 0516   07/03/19 2245  cefTRIAXone (ROCEPHIN) 2 g in sodium chloride 0.9 % 100 mL IVPB  Status:  Discontinued     2 g 200 mL/hr over 30 Minutes Intravenous Every 24 hours 07/03/19 2230 07/04/19 1336   07/03/19 2245  azithromycin (ZITHROMAX) 500 mg in sodium chloride 0.9 % 250 mL IVPB  Status:  Discontinued     500 mg 250 mL/hr over 60 Minutes Intravenous Every 24 hours 07/03/19 2230 07/05/19 1037     Subjective: Today, seen and examined at bedside.  Denies any nausea vomiting dizziness.  Patient slid off the bed but did not hurt herself as per the nursing staff.  No fever chills or rigor.  Objective: Vitals:   08/20/19 0610 08/20/19 0619  BP: 140/71   Pulse: 94   Resp: (!) 30 20  Temp:    SpO2: 100%     Intake/Output Summary (Last 24 hours) at 08/20/2019 0705 Last data filed at 08/19/2019 1153 Gross per 24 hour  Intake --  Output 1903 ml  Net -1903 ml   Filed Weights   08/19/19 0748 08/19/19 1153 08/20/19 0316  Weight: 41.3 kg 39 kg 41.7 kg   Body mass index is 14.85 kg/m.   Physical Exam:  General: Thinly built built, not in obvious distress, alert awake communicative.  Deconditioned and frail.  Weak voice. HENT: Normocephalic, pupils equally reacting to light and accommodation.  No scleral pallor or icterus noted. Oral mucosa is moist.  Left internal jugular hemodialysis catheter in place Chest:  Clear breath sounds.  Diminished breath sounds bilaterally. No crackles or wheezes.  CVS: S1 &S2 heard. No murmur.  Tachycardic. Abdomen: Soft, nontender, nondistended.  Bowel sounds are heard.  Liver is not palpable, no abdominal mass palpated.  PEG tube in place. Extremities: Cold hands, cyanosis of the digits noted. Psych: Alert, awake and communicative.   Mildly anxious. CNS:  No cranial nerve deficits.  Power equal in all extremities.  No sensory deficits noted.  No cerebellar signs.   Skin: Warm and dry.  Cyanosis of the extremities.   Data Review: I have personally reviewed the following laboratory data and studies,  CBC: Recent Labs  Lab 08/15/19 0251 08/17/19 0303 08/18/19 0240 08/19/19 0650  WBC 8.2 6.4 6.7 4.9  HGB 12.9 13.3 12.4 12.6  HCT 43.9 45.2 42.7 42.9  MCV 94.0 93.0 93.8 93.1  PLT 174 119* 135* 347*   Basic Metabolic Panel: Recent Labs  Lab 08/16/19 0257 08/17/19 0303 08/18/19 0240 08/19/19 0650 08/20/19 0302  NA 134* 132* 133* 131* 133*  K 4.3 4.5 4.2 4.6 4.2  CL 94* 92* 93* 87* 94*  CO2 26 22 27  21* 24  GLUCOSE 98 97 124* 76 116*  BUN 56* 98* 59* 111* 58*  CREATININE 2.16* 3.23* 2.06* 3.22* 2.00*  CALCIUM 9.1 9.5 9.0 9.2 9.2  MG  --   --  2.4  --   --   PHOS 4.7* 6.8* 4.7* 6.1* 3.4   Liver Function Tests: Recent Labs  Lab 08/16/19 0257 08/17/19 0303 08/18/19 0240 08/19/19 0650 08/20/19 0302  AST  --   --  50*  --   --   ALT  --   --  22  --   --   ALKPHOS  --   --  104  --   --   BILITOT  --   --  0.4  --   --   PROT  --   --  5.4*  --   --   ALBUMIN 3.0* 3.1* 3.0* 3.0* 3.0*   No results for input(s): LIPASE, AMYLASE in the last 168 hours. No results for input(s): AMMONIA in the last 168 hours. Cardiac Enzymes: No results for input(s): CKTOTAL, CKMB, CKMBINDEX, TROPONINI in the last 168 hours. BNP (last 3 results) Recent Labs    04/04/19 2000 07/03/19 1930 07/09/19 0347  BNP 76.7 1,124.1* 4,004.9*    ProBNP (last 3 results) No results for input(s): PROBNP in the last 8760 hours.  CBG: Recent Labs  Lab 08/19/19 0356 08/19/19 1557 08/19/19 1932 08/19/19 2313 08/20/19 0308  GLUCAP 121* 126* 131* 128* 124*   No results found for this or any previous visit (from the past 240 hour(s)).   Studies: No results found.  Scheduled Meds: . acetaminophen  500 mg Oral BID  .  Chlorhexidine Gluconate Cloth  6 each Topical Q0600  . collagenase   Topical BID  . diclofenac Sodium  2 g Topical BID  . diltiazem  120 mg Oral Daily  . famotidine  10 mg Oral Daily  . feeding supplement (PRO-STAT SUGAR FREE 64)  30 mL Per Tube Daily  . ferric citrate  420 mg Oral TID WC  . gabapentin  100 mg Per Tube Q12H  . Gerhardt's butt cream   Topical TID  . heparin  5,000 Units Subcutaneous Q8H  . loratadine  10 mg Oral Daily  . mouth rinse  15 mL Mouth Rinse BID  . multivitamin  1 tablet Oral QHS  . neomycin-bacitracin-polymyxin   Topical BID  . nystatin  5 mL Mouth/Throat QID  . pantoprazole sodium  40 mg Per Tube BID  . predniSONE  60 mg Oral Q breakfast   Followed by  . [START ON 08/22/2019] predniSONE  50 mg Oral Q breakfast   Followed by  . [START ON 09/13/2019] predniSONE  40 mg Oral Q breakfast   Followed by  . [START ON 09/16/2019] predniSONE  30 mg Oral Q breakfast  . sertraline  25 mg Oral BID  . sodium chloride irrigation  15 mL Irrigation QID  . thiamine  100 mg Oral Daily    Continuous Infusions: . sodium chloride    . sodium chloride    . feeding supplement (NEPRO CARB STEADY) 1,000 mL (08/19/19 1612)    Flora Lipps, MD  [facility] 08/20/2019

## 2019-08-20 NOTE — Progress Notes (Signed)
Heard a noise from table in the room and walked in to find  the patient sitting on the floor from the side of the low bed.She was sitting upward against the bed. There is no visible injuries or suspicions of a injury she appears stable. Notified provider the plan is to continue to monitor.

## 2019-08-21 LAB — GLUCOSE, CAPILLARY
Glucose-Capillary: 102 mg/dL — ABNORMAL HIGH (ref 70–99)
Glucose-Capillary: 119 mg/dL — ABNORMAL HIGH (ref 70–99)
Glucose-Capillary: 125 mg/dL — ABNORMAL HIGH (ref 70–99)
Glucose-Capillary: 131 mg/dL — ABNORMAL HIGH (ref 70–99)
Glucose-Capillary: 148 mg/dL — ABNORMAL HIGH (ref 70–99)
Glucose-Capillary: 33 mg/dL — CL (ref 70–99)
Glucose-Capillary: 68 mg/dL — ABNORMAL LOW (ref 70–99)
Glucose-Capillary: 95 mg/dL (ref 70–99)

## 2019-08-21 LAB — RENAL FUNCTION PANEL
Albumin: 2.7 g/dL — ABNORMAL LOW (ref 3.5–5.0)
Anion gap: 17 — ABNORMAL HIGH (ref 5–15)
BUN: 102 mg/dL — ABNORMAL HIGH (ref 8–23)
CO2: 22 mmol/L (ref 22–32)
Calcium: 9.1 mg/dL (ref 8.9–10.3)
Chloride: 87 mmol/L — ABNORMAL LOW (ref 98–111)
Creatinine, Ser: 3.01 mg/dL — ABNORMAL HIGH (ref 0.44–1.00)
GFR calc Af Amer: 17 mL/min — ABNORMAL LOW (ref >60)
GFR calc non Af Amer: 15 mL/min — ABNORMAL LOW (ref >60)
Glucose, Bld: 100 mg/dL — ABNORMAL HIGH (ref 70–99)
Phosphorus: 4.8 mg/dL — ABNORMAL HIGH (ref 2.5–4.6)
Potassium: 4.7 mmol/L (ref 3.5–5.1)
Sodium: 126 mmol/L — ABNORMAL LOW (ref 135–145)

## 2019-08-21 LAB — MRSA PCR SCREENING: MRSA by PCR: NEGATIVE

## 2019-08-21 MED ORDER — ALTEPLASE 2 MG IJ SOLR: 2.0000 mg | Freq: Once | INTRAMUSCULAR | Status: DC | PRN

## 2019-08-21 MED ORDER — PENTAFLUOROPROP-TETRAFLUOROETH EX AERO: 1.0000 "application " | INHALATION_SPRAY | CUTANEOUS | Status: DC | PRN

## 2019-08-21 MED ORDER — SODIUM CHLORIDE 0.9 % IV SOLN: 100.0000 mL | INTRAVENOUS | Status: DC | PRN

## 2019-08-21 MED ORDER — HEPARIN SODIUM (PORCINE) 1000 UNIT/ML DIALYSIS: 1000.0000 [IU] | INTRAMUSCULAR | Status: DC | PRN

## 2019-08-21 MED ORDER — CHLORHEXIDINE GLUCONATE CLOTH 2 % EX PADS
6.0000 | MEDICATED_PAD | Freq: Every day | CUTANEOUS | Status: DC
  Administered 2019-08-22 – 2019-08-23 (×2): 6 via TOPICAL

## 2019-08-21 MED ORDER — LIDOCAINE HCL (PF) 1 % IJ SOLN: 5.0000 mL | INTRAMUSCULAR | Status: DC | PRN

## 2019-08-21 MED ORDER — LIDOCAINE-PRILOCAINE 2.5-2.5 % EX CREA: 1.0000 "application " | TOPICAL_CREAM | CUTANEOUS | Status: DC | PRN

## 2019-08-21 NOTE — Progress Notes (Signed)
Renal Navigator received messages from CIR AC/C. Cletus Gash and CM/D. Lovena Le that medical team has determined that patient is ready for admission to CIR today and now needs to be re-referred for OP HD treatment (since her seat had to be previously canceled due to amount of time since initial referral).  Renal Navigator submitted referral for OP HD treatment to [facility] Admissions to request treatment at [facility] upon discharge from [place]. Renal Navigator informed Admissions and OP HD clinic that we would like patient to be admitted to CIR today pending OP HD acceptance. Renal Navigator will follow closely.  Alphonzo Cruise, [place] Renal Navigator [phone]

## 2019-08-21 NOTE — Progress Notes (Signed)
Physical Therapy Treatment Patient Details Name: Joanna Ali MRN: [id number] DOB: 1951-02-07 Today's Date: 08/21/2019    History of Present Illness Pt is a 69 y.o. female admitted 07/03/19 with anasarca, fatigue and tachycardia. 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 Pt with pericardial effusion s/p pericardial window. PEG tube placed. PMH includes former tobacco and alcohol abuse, HTN, chronic pleural effusion.    PT Comments    Pt OOB in recliner with husband present upon arrival of PT, agreeable to PT session despite reports of fatigue. The pt was able to ambulate ~28f x2 with use of RW and min guard of 1 progressing to minA of 1 with pt fatigue. The pt continues to present with limitations in functional mobility, strength, and activity tolerance compared to their prior level of function and independence due to above dx, and will require sig rehab to return to her prior level of function. The pt's course remains complicated by her sig anxiety regarding mobility, and she requires max encouragement for mobility progression. The pt and her husband are still desiring CIR for post-acute rehab, but at this point the pt cannot tolerate 3 hours of intensive rehab each day. The pt was educated thoroughly in the importance of pushing to complete HEP and ambulate with RN staff to further progress mobility tolerance, and both pt and her husband verbalized agreement. If the pt is unable to progress mobility tolerance, will need to change recommendation to SNF as it is more appropriate for pt's current level of function.    Follow Up Recommendations  CIR;Supervision/Assistance - 24 hour(CIR vs SNF due to pt activity tolerance)     Equipment Recommendations  Wheelchair (measurements PT);Wheelchair cushion (measurements PT);Hospital bed    Recommendations for Other Services       Precautions / Restrictions Precautions Precautions: Fall Precaution Comments: PEG  tube Restrictions Weight Bearing Restrictions: No    Mobility  Bed Mobility               General bed mobility comments: pt OOB in recliner upon PT arrival  Transfers Overall transfer level: Needs assistance Equipment used: Rolling walker (2 wheeled);2 person hand held assist Transfers: Sit to/from Stand Sit to Stand: Min assist         General transfer comment: minA progressing to modA of 1 with pt fatigue, completed x5 through session. pt initially prefered HHA of 2, then requesting RW  Ambulation/Gait Ambulation/Gait assistance: Min guard Gait Distance (Feet): 8 Feet(8 ft x 2) Assistive device: Rolling walker (2 wheeled) Gait Pattern/deviations: Trunk flexed;Shuffle;Step-to pattern Gait velocity: decreased   General Gait Details: pt with sig short shuffle steps, progressively incresed trunk flexion with fatigue/fear   Stairs             Wheelchair Mobility    Modified Rankin (Stroke Patients Only)       Balance Overall balance assessment: Needs assistance Sitting-balance support: Bilateral upper extremity supported;Feet supported Sitting balance-Leahy Scale: Fair Sitting balance - Comments: pt able to maintain with supervision if she has UE or trunk support   Standing balance support: Bilateral upper extremity supported;During functional activity Standing balance-Leahy Scale: Poor Standing balance comment: minA-minG with BUE support of RW                            Cognition Arousal/Alertness: Awake/alert Behavior During Therapy: WFL for tasks assessed/performed;Anxious Overall Cognitive Status: Impaired/Different from baseline Area of Impairment: Problem solving;Safety/judgement;Following commands  Following Commands: Follows one step commands with increased time Safety/Judgement: Decreased awareness of safety;Decreased awareness of deficits   Problem Solving: Slow processing General Comments: pt  reports significant fear and anxiety related to movement and "not being able to do it" when asked to progress mobility. Pt reporting fatigue and anxiety with most movement, asking for prolonged rest breaks. Able to be redirected and encouraged to relax with guided breathing.      Exercises General Exercises - Lower Extremity Long Arc Quad: Both;Seated;10 reps Hip Flexion/Marching: AROM;Both;5 reps;Seated Heel Raises: Strengthening;Both;10 reps;Seated    General Comments General comments (skin integrity, edema, etc.): HR increased to 120s with mobility, recovers well with seated rest. RR to 50s with anxiety during seated rest, improves to 20s with guided breathing      Pertinent Vitals/Pain Pain Assessment: Faces Faces Pain Scale: Hurts little more Pain Location: buttocks, general, backs of BLE with standing Pain Descriptors / Indicators: Sore Pain Intervention(s): Limited activity within patient's tolerance;Monitored during session;Repositioned;Relaxation;Utilized relaxation techniques    Home Living                      Prior Function            PT Goals (current goals can now be found in the care plan section) Acute Rehab PT Goals Patient Stated Goal: to get stronger, possibly go to CIR PT Goal Formulation: With patient/family Time For Goal Achievement: 09/04/19 Potential to Achieve Goals: Fair Progress towards PT goals: Progressing toward goals(very slowly, pt anxiety is a limiting factor)    Frequency    Min 3X/week      PT Plan Discharge plan needs to be updated    Co-evaluation              AM-PAC PT "6 Clicks" Mobility   Outcome Measure  Help needed turning from your back to your side while in a flat bed without using bedrails?: A Lot Help needed moving from lying on your back to sitting on the side of a flat bed without using bedrails?: A Lot Help needed moving to and from a bed to a chair (including a wheelchair)?: A Little Help needed  standing up from a chair using your arms (e.g., wheelchair or bedside chair)?: A Little Help needed to walk in hospital room?: A Lot Help needed climbing 3-5 steps with a railing? : Total 6 Click Score: 13    End of Session Equipment Utilized During Treatment: Gait belt Activity Tolerance: Patient limited by fatigue Patient left: in chair;with call bell/phone within reach;with family/visitor present Nurse Communication: Mobility status PT Visit Diagnosis: Unsteadiness on feet (R26.81);Muscle weakness (generalized) (M62.81);Difficulty in walking, not elsewhere classified (R26.2);Pain Pain - Right/Left: (generalized) Pain - part of body: (hands,  legs, sacrum)     Time: 1828-8337 PT Time Calculation (min) (ACUTE ONLY): 51 min  Charges:  $Gait Training: 23-37 mins $Therapeutic Exercise: 8-22 mins                     Karma Ganja, PT, DPT   Acute Rehabilitation Department Pager #: [phone]   Otho Bellows 08/21/2019, 1:01 PM

## 2019-08-21 NOTE — PMR Pre-admission (Signed)
PMR Admission Coordinator Pre-Admission Assessment  Patient: Joanna Ali is an 69 y.o., female MRN: [id number] DOB: 1951/03/29 Height: 5' 6"  (167.6 cm) Weight: 41.3 kg  Insurance Information HMO:     PPO:      PCP:      IPA:      80/20:      OTHER:  PRIMARY: Medicare A and B      Policy#: [id number]      Subscriber: patient CM Name:       Phone#:      Fax#:  Pre-Cert#: verified eligibility online      Employer:  Benefits:  Phone #:      Name:  Eff. Date: 02/17/14 (A and B)     Deduct: $1484      Out of Pocket Max: n/a      Life Max: n/a CIR: 100%      SNF: 20 full days Outpatient: 80%     Co-Pay: 20% Home Health: 100%      Co-Pay: DME: 80%     Co-Pay: 20% Providers: pt choice   SECONDARY: [employer]      Policy#:  [id number]     Subscriber:  CM Name:       Phone#:      Fax#:  Pre-Cert#:       Employer:  Benefits:  Phone #: [phone]     Name:  Eff. Date:      Deduct:       Out of Pocket Max:       Life Max:  CIR:       SNF:  Outpatient:      Co-Pay:  Home Health:       Co-Pay:  DME:      Co-Pay:   Medicaid Application Date:       Case Manager:  Disability Application Date:       Case Worker:   The "Data Collection Information Summary" for patients in Inpatient Rehabilitation Facilities with attached "Privacy Act [place] Records" was provided and verbally reviewed with: Patient and Family  Emergency Contact Information Contact Information    Name Relation Home Work Armstrong Spouse [phone]  [phone]      Current Medical History  Patient Admitting Diagnosis: debility, new scleroderma dx, new HD  History of Present Illness: Joanna Ali is a 69 year old female with history of hepatic steatosis, HTN, peripheral neuropathy, progressive DOE with fluid overload since 02/2019 and history of pericardial/pleural effusion who was admitted on 07/03/19 with AKI, anasarca and fatigue. She has had negative work up by rheumatology and at [facility].  She  was started on IV diuresis and cardiac echo showed moderately large pericardial effusion. Cardiac cath and cardiac MRI without evidence of pericardial constriction- negative for amyloidosis.  Dr Tera Mater consulted for assistance and she underwent R-VATS with pericardial window on 12/23. She continued to require diuresis and had worsening of renal function with intermittent hyperkalemia and uremia with lethary requiring initiation of HD 12/31.    She has had poor po intake, difficulty swallowing, persistent tail bone pain with FTT and PEG placed by Dr. Anselm Pancoast on 01/08.  Palliative care consulted for Joanna Ali and questioned a trial of steroids empirically due to concerns of carcinoid or multisystem inflammatory syndrome. She has had bouts of agitation and nephrology expressed concerns of steroids making anxiety worse. Question of transfer to [facility] (no beds). Rheumatology/ Dr. Kathlene November consulted for input on autoimmune process and she was started on  high dose steroids due to consideration of scleroderma with CREST syndrome.      Anti-RNA polymerase III antibody positive. Repeat SPEP negative for monoclonal protein. Dr. Jana Hakim consulted for input to rule out occult malignancy as she developed leucopenia. He  questioned possibility of malignancy with scleroderma as secondary.  CT abdomen/pelvis/chest negative and pleural fluid negative for malignancy--he felt that further screening for malignancy can be done on outpatient basis.  She underwent repeat thoracocentesis 1/12 positive for mesothelial cells--evaluate on outpatient basis once stronger.   PAF with RVR 1/24  treated with metoprolol for rate control and Dr. Margaretann Loveless  --not a candidate for coumadin or amiodarone per cardiology. Dr. Scot Dock consulted due to ischemic fingers --patient with positive pulses with negative BLE dopplers and changes felt to be due to scleroderma/small vessel disease. Metoprolol changed to Cardizem due to worsening of Raynaud's phenomenon. She  was started on slow steroid taper on 1/26--to decrease by 10 mg per week to 30 mg daily with follow up with Rheum. Patient with AKI with anuria requiring HD TTS.  Therapy evaluations were completed and pt was recommended for CIR.      Patient's medical record from [facility] has been reviewed by the rehabilitation admission coordinator and physician.  Past Medical History  Past Medical History:  Diagnosis Date  . Hypertension   . Leg swelling 03/2019  . Short of breath on exertion 03/2019    Family History   family history includes Hypertension in her mother.  Prior Rehab/Hospitalizations Has the patient had prior rehab or hospitalizations prior to admission? Yes  Has the patient had major surgery during 100 days prior to admission? Yes   Current Medications  Current Facility-Administered Medications:  .  0.9 %  sodium chloride infusion, 100 mL, Intravenous, PRN, Joelyn Oms, Ryan B, MD .  0.9 %  sodium chloride infusion, 100 mL, Intravenous, PRN, Joelyn Oms, Ryan B, MD .  0.9 %  sodium chloride infusion, 100 mL, Intravenous, PRN, Madelon Lips, MD .  0.9 %  sodium chloride infusion, 100 mL, Intravenous, PRN, Madelon Lips, MD .  acetaminophen (TYLENOL) 160 MG/5ML solution 500 mg, 500 mg, Oral, Q6H PRN, Oswald Hillock, MD, 500 mg at 08/16/19 1027 .  acetaminophen (TYLENOL) tablet 500 mg, 500 mg, Oral, BID, Regalado, Belkys A, MD, 500 mg at 08/23/19 1046 .  ALPRAZolam Duanne Moron) tablet 0.25 mg, 0.25 mg, Oral, TID PRN, Pokhrel, Laxman, MD, 0.25 mg at 08/22/19 2105 .  alteplase (CATHFLO ACTIVASE) injection 2 mg, 2 mg, Intracatheter, Once PRN, Madelon Lips, MD .  alum & mag hydroxide-simeth (MAALOX/MYLANTA) 200-200-20 MG/5ML suspension 30 mL, 30 mL, Oral, Q4H PRN, Dorothy Spark, MD, 30 mL at 07/23/19 2252 .  antiseptic oral rinse (BIOTENE) solution 15 mL, 15 mL, Mouth Rinse, PRN, Lane Hacker L, DO .  bisacodyl (DULCOLAX) EC tablet 10 mg, 10 mg, Oral, Daily PRN,  Rosita Fire, MD .  bisacodyl (DULCOLAX) suppository 10 mg, 10 mg, Rectal, Daily PRN, Madelon Lips, MD .  camphor-menthol [facility]) lotion, , Topical, PRN, Antony Odea, PA-C, Given at 08/06/19 1400 .  Chlorhexidine Gluconate Cloth 2 % PADS 6 each, 6 each, Topical, Q0600, Edrick Oh, MD, 6 each at 08/21/19 [phone] .  Chlorhexidine Gluconate Cloth 2 % PADS 6 each, 6 each, Topical, Q0600, Madelon Lips, MD, 6 each at 08/23/19 0522 .  collagenase (SANTYL) ointment, , Topical, BID, Nahser, Wonda Cheng, MD, Given at 08/22/19 2107 .  diclofenac Sodium (VOLTAREN) 1 % topical gel 2 g,  2 g, Topical, BID, Regalado, Belkys A, MD, 2 g at 08/22/19 2108 .  diltiazem (CARDIZEM CD) 24 hr capsule 120 mg, 120 mg, Oral, Daily, Pokhrel, Laxman, MD, 120 mg at 08/22/19 2105 .  famotidine (PEPCID) tablet 10 mg, 10 mg, Oral, Daily, Dorothy Spark, MD, 10 mg at 08/23/19 1047 .  feeding supplement (NEPRO CARB STEADY) liquid 1,000 mL, 1,000 mL, Per Tube, Continuous, Oswald Hillock, MD, Last Rate: 45 mL/hr at 08/22/19 2106, 1,000 mL at 08/22/19 2106 .  feeding supplement (PRO-STAT SUGAR FREE 64) liquid 30 mL, 30 mL, Per Tube, Daily, Skeet Latch, MD, 30 mL at 08/23/19 1046 .  ferric citrate (AURYXIA) tablet 420 mg, 420 mg, Oral, TID WC, Edrick Oh, MD, 420 mg at 08/23/19 1045 .  gabapentin (NEURONTIN) 250 MG/5ML solution 100 mg, 100 mg, Per Tube, Q12H, Einar Grad, RPH, 100 mg at 08/23/19 1052 .  Gerhardt's butt cream, , Topical, TID, Nahser, Wonda Cheng, MD, Given at 08/22/19 2107 .  heparin injection 1,000 Units, 1,000 Units, Dialysis, PRN, Madelon Lips, MD, 3,800 Units at 08/22/19 1014 .  heparin injection 1,000 Units, 1,000 Units, Dialysis, PRN, Madelon Lips, MD .  heparin injection 5,000 Units, 5,000 Units, Subcutaneous, Q8H, Bruning, Kevin, PA-C, 5,000 Units at 08/23/19 2500 .  hydrOXYzine (ATARAX/VISTARIL) tablet 12.5 mg, 12.5 mg, Oral, BID PRN, Antony Odea, PA-C, 12.5 mg  at 08/17/19 2036 .  lidocaine (PF) (XYLOCAINE) 1 % injection 5 mL, 5 mL, Intradermal, PRN, Madelon Lips, MD .  lidocaine-prilocaine (EMLA) cream 1 application, 1 application, Topical, PRN, Madelon Lips, MD .  loratadine (CLARITIN) tablet 10 mg, 10 mg, Oral, Daily, Lane Hacker L, DO, 10 mg at 08/23/19 1046 .  MEDLINE mouth rinse, 15 mL, Mouth Rinse, BID, Roddenberry, Myron G, PA-C, 15 mL at 08/22/19 2108 .  multivitamin (RENA-VIT) tablet 1 tablet, 1 tablet, Oral, QHS, Edrick Oh, MD, 1 tablet at 08/22/19 2105 .  neomycin-bacitracin-polymyxin (NEOSPORIN) ointment, , Topical, BID, Daune Perch, NP, Given at 08/23/19 1055 .  nystatin (MYCOSTATIN) 100000 UNIT/ML suspension 500,000 Units, 5 mL, Mouth/Throat, QID, Skeet Latch, MD, 500,000 Units at 08/23/19 1045 .  ondansetron (ZOFRAN) injection 4 mg, 4 mg, Intravenous, Q6H PRN, Pokhrel, Laxman, MD, 4 mg at 08/23/19 0646 .  oxyCODONE (Oxy IR/ROXICODONE) immediate release tablet 5 mg, 5 mg, Oral, Q8H PRN, Regalado, Belkys A, MD, 5 mg at 08/22/19 0347 .  pantoprazole sodium (PROTONIX) 40 mg/20 mL oral suspension 40 mg, 40 mg, Per Tube, BID, Darrick Meigs, Marge Duncans, MD, 40 mg at 08/22/19 2105 .  pentafluoroprop-tetrafluoroeth (GEBAUERS) aerosol 1 application, 1 application, Topical, PRN, Madelon Lips, MD .  polyethylene glycol (MIRALAX / GLYCOLAX) packet 17 g, 17 g, Oral, Daily, Pokhrel, Laxman, MD, 17 g at 08/23/19 1052 .  polyvinyl alcohol (LIQUIFILM TEARS) 1.4 % ophthalmic solution 1 drop, 1 drop, Both Eyes, BID PRN, Einar Grad, RPH .  [COMPLETED] predniSONE (DELTASONE) tablet 60 mg, 60 mg, Oral, Q breakfast, 60 mg at 08/21/19 0827 **FOLLOWED BY** predniSONE (DELTASONE) tablet 50 mg, 50 mg, Oral, Q breakfast, 50 mg at 08/23/19 1046 **FOLLOWED BY** [START ON 09/11/2019] predniSONE (DELTASONE) tablet 40 mg, 40 mg, Oral, Q breakfast **FOLLOWED BY** [START ON 08/22/2019] predniSONE (DELTASONE) tablet 30 mg, 30 mg, Oral, Q breakfast, Darrick Meigs,  Marge Duncans, MD .  senna-docusate (Senokot-S) tablet 1 tablet, 1 tablet, Oral, BID, Pokhrel, Laxman, MD, 1 tablet at 08/23/19 1046 .  sertraline (ZOLOFT) tablet 25 mg, 25 mg, Oral, BID, Roddenberry, Myron G, PA-C, 25 mg  at 08/23/19 1046 .  sodium chloride irrigation 0.9 % 15 mL, 15 mL, Irrigation, QID, Golding, Elizabeth L, DO, 15 mL at 08/23/19 1108 .  thiamine tablet 100 mg, 100 mg, Oral, Daily, Darrick Meigs, Marge Duncans, MD, 100 mg at 08/23/19 1048 .  witch hazel-glycerin (TUCKS) pad, , Topical, PRN, Dorothy Spark, MD  Patients Current Diet:  Diet Order            Diet regular        Diet regular Room service appropriate? Yes; Fluid consistency: Thin; Fluid restriction: 1500 mL Fluid  Diet effective now              Precautions / Restrictions Precautions Precautions: Fall Precaution Comments: PEG tube Restrictions Weight Bearing Restrictions: No   Has the patient had 2 or more falls or a fall with injury in the past year? No  Prior Activity Level Limited Community (1-2x/wk): using a SPC for amb PTA, not driving, limited outings to appointments  Prior Functional Level Self Care: Did the patient need help bathing, dressing, using the toilet or eating? Independent  Indoor Mobility: Did the patient need assistance with walking from room to room (with or without device)? Independent  Stairs: Did the patient need assistance with internal or external stairs (with or without device)? Needed some help  Functional Cognition: Did the patient need help planning regular tasks such as shopping or remembering to take medications? Needed some help  Home Assistive Devices / Equipment Home Assistive Devices/Equipment: Bedside commode/3-in-1, Cane (specify quad or straight), Eyeglasses Home Equipment: Cane - single point, Other (comment), Toilet riser, Shower seat, Hand held shower head, Bedside commode  Prior Device Use: Indicate devices/aids used by the patient prior to current illness,  exacerbation or injury? cane  Current Functional Level Cognition  Overall Cognitive Status: Impaired/Different from baseline Difficult to assess due to: Impaired communication Current Attention Level: Sustained Orientation Level: Oriented X4 Following Commands: Follows one step commands with increased time Safety/Judgement: Decreased awareness of safety, Decreased awareness of deficits General Comments: pt reports significant fear and anxiety related to movement and "not being able to do it" when asked to progress mobility. Pt reporting fatigue and anxiety with most movement, asking for prolonged rest breaks. Able to be redirected and encouraged to relax with guided breathing.    Extremity Assessment (includes Sensation/Coordination)  Upper Extremity Assessment: Generalized weakness  Lower Extremity Assessment: Generalized weakness(knees starting to for contractures)    ADLs  Overall ADL's : Needs assistance/impaired Eating/Feeding: Minimal assistance, Sitting Eating/Feeding Details (indicate cue type and reason): issued foam build ups for eating utensils and practice use with pt's ice and magic cup Grooming: Sitting, Wash/dry hands, Wash/dry face, Moderate assistance Grooming Details (indicate cue type and reason): decreased thoroughness Upper Body Bathing: Maximal assistance, Sitting Lower Body Bathing: Total assistance, +2 for physical assistance, Sit to/from stand Upper Body Dressing : Maximal assistance, Sitting Upper Body Dressing Details (indicate cue type and reason): front opening gown Lower Body Dressing: Total assistance, Bed level Toilet Transfer: Maximal assistance, Control and instrumentation engineer Details (indicate cue type and reason): face to face Toileting- Clothing Manipulation and Hygiene: Total assistance Functional mobility during ADLs: Moderate assistance, +2 for safety/equipment, Rolling walker General ADL Comments: requiring total assist     Mobility  Overal bed  mobility: Needs Assistance Bed Mobility: Rolling, Sidelying to Sit Rolling: Min assist Sidelying to sit: Mod assist Supine to sit: Mod assist, HOB elevated Sit to supine: Total assist, HOB elevated General bed mobility comments: pt  OOB in recliner upon PT arrival    Transfers  Overall transfer level: Needs assistance Equipment used: Rolling walker (2 wheeled), 2 person hand held assist Transfers: Sit to/from Stand Sit to Stand: Min assist Stand pivot transfers: Mod assist General transfer comment: minA progressing to modA of 1 with pt fatigue, completed x5 through session. pt initially prefered HHA of 2, then requesting RW    Ambulation / Gait / Stairs / Wheelchair Mobility  Ambulation/Gait Ambulation/Gait assistance: Counsellor (Feet): 8 Feet(8 ft x 2) Assistive device: Rolling walker (2 wheeled) Gait Pattern/deviations: Trunk flexed, Shuffle, Step-to pattern General Gait Details: pt with sig short shuffle steps, progressively incresed trunk flexion with fatigue/fear Gait velocity: decreased Gait velocity interpretation: <1.8 ft/sec, indicate of risk for recurrent falls    Posture / Balance Dynamic Sitting Balance Sitting balance - Comments: pt able to maintain with supervision if she has UE or trunk support Balance Overall balance assessment: Needs assistance Sitting-balance support: Bilateral upper extremity supported, Feet supported Sitting balance-Leahy Scale: Fair Sitting balance - Comments: pt able to maintain with supervision if she has UE or trunk support Standing balance support: Bilateral upper extremity supported, During functional activity Standing balance-Leahy Scale: Poor Standing balance comment: minA-minG with BUE support of RW    Special needs/care consideration BiPAP/CPAP no CPM no Continuous Drip IV no Dialysis yes        Days TRS  Life Vest no Oxygen no Special Bed air mattress Trach Size  Wound Vac (area)       Location  Skin fragile                               Location  Bowel mgmt: incontinent Bladder mgmt: anuric  Diabetic mgmt: no Behavioral consideration no Chemo/radiation no   Previous Home Environment (from acute therapy documentation) Living Arrangements: Spouse/significant other Available Help at Discharge: Family, Available 24 hours/day Type of Home: House Home Layout: Two level, Able to live on main level with bedroom/bathroom Home Access: Stairs to enter Entrance Stairs-Rails: None Entrance Stairs-Number of Steps: 3 Bathroom Shower/Tub: Multimedia programmer: Standard Home Care Services: (P) No Additional Comments: cushion for toilet  Discharge Living Setting Plans for Discharge Living Setting: Patient's home Type of Home at Discharge: House Discharge Home Layout: Two level, Able to live on main level with bedroom/bathroom Discharge Home Access: Stairs to enter Entrance Stairs-Rails: None(will install rails or ramp) Entrance Stairs-Number of Steps: 3 Discharge Bathroom Shower/Tub: Walk-in shower Discharge Bathroom Toilet: Standard Discharge Bathroom Accessibility: Yes How Accessible: Accessible via walker Does the patient have any problems obtaining your medications?: No  Social/Family/Support Systems Anticipated Caregiver: spouse, Shawanna Zanders Anticipated Caregiver's Contact Information: [phone] Ability/Limitations of Caregiver: min/mod assist Caregiver Availability: 24/7 Discharge Plan Discussed with Primary Caregiver: Yes Is Caregiver In Agreement with Plan?: Yes Does Caregiver/Family have Issues with Lodging/Transportation while Pt is in Rehab?: No  Goals/Additional Needs Patient/Family Goal for Rehab: PT/OT min assist Expected length of stay: 14-18 days Special Service Needs: HD TRS  Pt/Family Agrees to Admission and willing to participate: Yes Program Orientation Provided & Reviewed with Pt/Caregiver Including Roles  & Responsibilities: Yes  Decrease burden of Care  through IP rehab admission: n/a  Possible need for SNF placement upon discharge: Possibly  Patient Condition: I have reviewed medical records from [facility], spoken with CM, and patient and spouse. I met with patient at the bedside for inpatient rehabilitation assessment.  Patient will benefit from ongoing PT and OT, can actively participate in 3 hours of therapy a day 5 days of the week, and can make measurable gains during the admission.  Patient will also benefit from the coordinated team approach during an Inpatient Acute Rehabilitation admission.  The patient will receive intensive therapy as well as Rehabilitation physician, nursing, social worker, and care management interventions.  Due to bowel management, safety, skin/wound care, disease management, medication administration, pain management and patient education the patient requires 24 hour a day rehabilitation nursing.  The patient is currently min/mod with mobility and basic ADLs.  Discharge setting and therapy post discharge at home is anticipated.  Patient has agreed to participate in the Acute Inpatient Rehabilitation Program and will admit today.  Preadmission Screen Completed By:  Michel Santee, PT, DPT 08/23/2019 11:33 AM ______________________________________________________________________   Discussed status with Dr. Posey Pronto on 08/23/19  at 11:33 AM  and received approval for admission today.  Admission Coordinator:  Michel Santee, PT, DPT time 11:33 AM Sudie Grumbling 08/23/19    Assessment/Plan: Diagnosis: Debility 1. Does the need for close, 24 hr/day Medical supervision in concert with the patient's rehab needs make it unreasonable for this patient to be served in a less intensive setting? Yes  2. Co-Morbidities requiring supervision/potential complications: hepatic steatosis, HTN, peripheral neuropathy, progressive DOE with fluid overload since 02/2019 and history of pericardial/pleural effusion who was admitted on 07/03/19  with AKI, anasarca and fatigue 3. Due to safety, skin/wound care, disease management, medication administration, pain management and patient education, does the patient require 24 hr/day rehab nursing? Yes 4. Does the patient require coordinated care of a physician, rehab nurse, PT, OT, to address physical and functional deficits in the context of the above medical diagnosis(es)? Yes Addressing deficits in the following areas: balance, endurance, locomotion, strength, transferring, bathing, dressing, toileting and psychosocial support 5. Can the patient actively participate in an intensive therapy program of at least 3 hrs of therapy 5 days a week? Potentially 6. The potential for patient to make measurable gains while on inpatient rehab is good 7. Anticipated functional outcomes upon discharge from inpatient rehab: supervision PT, supervision OT, n/a SLP 8. Estimated rehab length of stay to reach the above functional goals is: 13-17 days. 9. Anticipated discharge destination: Home 10. Overall Rehab/Functional Prognosis: good   MD Signature: Delice Lesch, MD, ABPMR

## 2019-08-21 NOTE — Progress Notes (Signed)
Occupational Therapy Treatment Patient Details Name: Joanna Ali MRN: [id number] DOB: 1950/12/30 Today's Date: 08/21/2019    History of present illness Pt is a 69 y.o. female admitted 07/03/19 with anasarca, fatigue and tachycardia. 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 Pt with pericardial effusion s/p pericardial window. PEG tube placed. PMH includes former tobacco and alcohol abuse, HTN, chronic pleural effusion.   OT comments  Focus of session on self feeding and educated pt in use of foam build ups for eating utensils. Pt is progressing slowly. Poor appetite. More facial expression today.   Follow Up Recommendations  SNF;Supervision/Assistance - 24 hour    Equipment Recommendations  Hospital bed;Wheelchair (measurements OT);Wheelchair cushion (measurements OT);3 in 1 bedside commode    Recommendations for Other Services      Precautions / Restrictions Precautions Precautions: Fall Precaution Comments: PEG tube Restrictions Weight Bearing Restrictions: No       Mobility   Balance    ADL either performed or assessed with clinical judgement   ADL Overall ADL's : Needs assistance/impaired Eating/Feeding: Minimal assistance;Sitting Eating/Feeding Details (indicate cue type and reason): issued foam build ups for eating utensils and practice use with pt's ice and magic cup Grooming: Sitting;Wash/dry hands;Wash/dry face;Moderate assistance Grooming Details (indicate cue type and reason): decreased thoroughness                                     Vision       Perception     Praxis      Cognition Arousal/Alertness: Awake/alert Behavior During Therapy: WFL for tasks assessed/performed(smiling more) Overall Cognitive Status: Impaired/Different from baseline Area of Impairment: Problem solving;Safety/judgement;Following commands                       Following Commands: Follows one step commands with increased  time Safety/Judgement: Decreased awareness of safety;Decreased awareness of deficits   Problem Solving: Slow processing         Exercises    Shoulder Instructions       General Comments     Pertinent Vitals/ Pain       Pain Assessment: Faces Faces Pain Scale: Hurts little more Pain Location: generalized Pain Descriptors / Indicators: Sore;Grimacing;Discomfort Pain Intervention(s): Limited activity within patient's tolerance;Monitored during session;Repositioned;Relaxation;Utilized relaxation techniques  Home Living                                          Prior Functioning/Environment              Frequency  Min 1X/week        Progress Toward Goals  OT Goals(current goals can now be found in the care plan section)  Progress towards OT goals: Progressing toward goals  Acute Rehab OT Goals Patient Stated Goal: to get stronger, possibly go to CIR OT Goal Formulation: With family Time For Goal Achievement: 08/25/19 Potential to Achieve Goals: [place] Discharge plan remains appropriate    Co-evaluation                 AM-PAC OT "6 Clicks" Daily Activity     Outcome Measure   Help from another person eating meals?: A Little Help from another person taking care of personal grooming?: A Lot Help from another person toileting, which includes using  toliet, bedpan, or urinal?: Total Help from another person bathing (including washing, rinsing, drying)?: A Lot Help from another person to put on and taking off regular upper body clothing?: A Lot Help from another person to put on and taking off regular lower body clothing?: Total 6 Click Score: 11    End of Session    OT Visit Diagnosis: Unsteadiness on feet (R26.81);Pain;Muscle weakness (generalized) (M62.81);Other symptoms and signs involving cognitive function   Activity Tolerance Patient limited by fatigue   Patient Left in bed;with call bell/phone within reach;with bed alarm  set   Nurse Communication          Time: [phone] OT Time Calculation (min): 17 min  Charges: OT General Charges $OT Visit: 1 Visit OT Treatments $Self Care/Home Management : 8-22 mins  [name], OTR/L Acute Rehabilitation Services Pager: [phone] Office: [phone]   [name] 08/21/2019, 1:27 PM

## 2019-08-21 NOTE — Progress Notes (Signed)
PROGRESS NOTE  Joanna Ali TDV:[id number] DOB: 06-22-1951 DOA: 07/03/2019 PCP: Renaldo Reel, DO   LOS: 48 days   Brief narrative: As per HPI,  69 year old female with a history of hypertension, chronic pericardial effusion, moderate aortic valve regurgitation, former smoker, alcohol abuse initially admitted on 07/06/2019 for progressive shortness of breath with anasarca and fatigue.  She was previously worked up by [facility] rheumatology with negative work-up including ESR, lupus anticoagulant, rheumatoid factor, TSH, ANCA, ANA, light free chain studies.  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  Since that time, patient had stopped drinking alcohol as advised.  She has been on diuretics and spironolactone without any improvement in symptoms.    CT of the chest on 07/04/2019 noted moderate pericardial effusion and airspace disease.  She underwent thoracentesis by PCCM which removed 700 mL of the fluid.  Patient underwent right heart cath on 07/06/2019 and pericardial window on 07/12/2019.  Attempts were made to diurese with IV diuretics but patient with worsening kidney function with creatinine up to 5.  Nephrology was formally consulted patient was started on dialysis.  She has been having poor p.o. intake so cortrak tube was placed.  She was then later placed on PEG tube for persistent poor p.o. intake, also patient developed oral pain after NG tube placement.  She is on hemodialysis for AKI with rapid progression to end-stage renal disease, severe anasarca, pericardial effusion, status post pericardial window.    Her labs showed RF positive at 71, INT RNA polymerase antibody +146 concern for scleroderma also she had some thickening of the skin with renal failure and pericardial involvement.  Pulmonary was consulted to evaluate for ILD. On  discussion with rheumatology at [facility] who recommended supportive care, acid reflux treated with PPI, joint pain Voltaren gel and Tylenol.  Prednisone taper.  For Raynaud's phenomenon calcium channel blocker.  If patient has interstitial lung disease then she should be evaluated for immunotherapy can be as outpatient when stronger.  Rheumatology did not recommend immunotherapy for just joint pain and skin manifestation.  Dr. Kathlene November, rheumatologist was consulted on phone.  He recommended to start high-dose IV Solu-Medrol 125 mg daily for 3 days and then prednisone 60 mg daily for week then prednisone 50 mg daily for a week then prednisone 40 mg daily for a week then prednisone 30 mg daily for a week which can be continued till seen by rheumatologist as outpatient.  Assessment/Plan:  Principal Problem:   Anasarca Active Problems:   Pericardial effusion   Hypertension   Elevated troponin   Atypical pneumonia   Hypoxia   Protein-calorie malnutrition, severe   Pressure injury of skin   Failure to thrive in adult   Goals of care, counseling/discussion   Dysphagia   Muscular weakness   Thrush of mouth and esophagus (HCC)   ESRD on hemodialysis (HCC)   Dyspnea   S/P thoracentesis   Atrial fibrillation with RVR (HCC)   AKI (acute kidney injury) ([place])   Scleroderma progressive (HCC)   Rheumatoid arthritis (HCC)   CREST syndrome (HCC)  Nausea, sickness during hemodialysis.  Improved. None today.  Try Xanax prior to hemodialysis which seems to help  Anxiety.  As needed Xanax.  Scleroderma /anasarca/joint pain/pericardial effusion-patient presented with anasarca, failure to thrive, joint pains, pericardial effusion, Raynaud's phenomenon.  She had positive anti-RNA polymerase 3 antibody.  Rheumatology evaluation in September 2020 showed CRP 13, ESR 25, ANA negative, c-ANCA/p-ANCA negative, scleroderma SCL 70 -, SSS (ro) negative, M spike negative electrophoresis, HIV negative.   Double-stranded DNA negative, ANA negative, RF 71.   . Rheumatologist Dr. Kathlene November,  recommended  IV Solu-Medrol 125 mg for 3 day followed by 60 mg prednisone daily for a week then wean down to 50 mg daily for a week then 40 mg daily for a week then 30 mg daily.  Patient can then continue with prednisone 30 mg daily and follow-up with rheumatology as outpatient.  On prednisone 60-milligram daily for 1 week from 08/14/2019.  Raynaud's phenomenon/crest syndrome with cold hands with peripheral cyanosis, likely due to underlying scleroderma.  Beta-blocker has been discontinued and switched to Cardizem. Vascular surgery has been consulted for this- impression is Raynaud's phenomena and no further intervention is planned.  Atrial fibrillation RVR.  Patient is not a good candidate for anticoagulation considering multiple comorbid conditions, pericardial window, ESRD and frailty.  Due to worsening Raynaud's phenomena, beta-blockers were changed to calcium channel blocker.    Leukopenia. improved. Oncology was consulted and does not recommend  search for malignancy.  CT chest  was negative, CT abdomen/pelvis on 1/16 was unremarkable , repeat SPEP showed no monoclonal protein with normal kappa lambda ratio.  She also had negative pleural fluid cytology x4.  Screening for other malignancy will be routine.  Most recent WBC count of 4.9.  No fever  Acute kidney injury now on hemodialysis due to progressive deterioration of renal function and anuria.  Nephrology on board.  On hemodialysis.  Continue hemodialysis Tuesday, Thursday and Saturday.  Lethargy, metabolic encephalopathy-improved after hemodialysis and cutting down on narcotics. Patient also received IV thiamine  Acute hypoxic respiratory failure/pulmonary edema/pleural effusion-improved. On room air currently. Underwent repeat thoracentesis on 08/01/2019.  Removed 1.1 L from left and 500 mL from right.  Pleural cytology showed reactive mesothelial cells.   Pleural fluid culture showed no growth.   Pericardial effusion with elevated troponin- Patient is status post pericardial window on 07/12/2019.  Pathology showed no malignancy, chronic inflammation, AFB negative.  Fungal culture  Negative. Cardiology on board.  Atypical pneumonia with hypoxia-completed antibiotic treatment.  Currently on room air  Severe malnutrition/failure to thrive- seen by speech therapy and has been started on regular diet.    Dysphagia/thrush-group A strep was negative on 07/28/2019, patient currently on oral nystatin, oral Diflucan.  Anxiety-continue Zoloft, Xanax 0.25 mg 3 times daily as needed for panic attack.    Back pain-continue with oxycodone as needed, Neurontin, Voltaren gel twice a day.  Hypertension-continue Cardizem  Sacral decubitus ulcer stage II present on admission.  Continue wound care.  VTE Prophylaxis: Heparin subq  Code Status: Full code  Family Communication:   I spoke with the patient's husband on the phone today and updated him about the clinical condition of the patient.  He wanted to see if the patient would be qualified for inpatient rehab.  I spoke with him that if she would not qualify whether he would be willing to consider subacute rehab.  He would like to think about it.  Social workers have been consulted.  Disposition Plan:   Skilled nursing facility as per physical therapy recommendation. Continue hemodialysis. PMR consulted for possible CIR, to be followed today.  Consultants:  Oncology  Nephrology  Palliative care  PMR  Procedures:  Hemodialysis  Thoracocentesis   right heart cath on 07/06/2019    pericardial window on 07/12/2019.  PEG tube placement  Antibiotics: None currently  Anti-infectives (From admission, onward)   Start     Dose/Rate Route Frequency Ordered Stop   07/29/19 1500  fluconazole (DIFLUCAN) tablet 200 mg     200 mg Oral Daily 07/29/19 1458 08/11/19 2116   07/28/19 1300   ceFAZolin (ANCEF) 2-4 GM/100ML-% IVPB    Note to Pharmacy: Kandy Garrison   : cabinet override      07/28/19 1300 07/28/19 1806   07/28/19 0700  ceFAZolin (ANCEF) IVPB 2g/100 mL premix     2 g 200 mL/hr over 30 Minutes Intravenous On call 07/27/19 1502 07/28/19 1340   07/18/19 1316  ceFAZolin (ANCEF) 2-4 GM/100ML-% IVPB    Note to Pharmacy: Manuela Neptune   : cabinet override      07/18/19 1316 07/18/19 1318   07/12/19 2115  ceFAZolin (ANCEF) IVPB 1 g/50 mL premix     1 g 100 mL/hr over 30 Minutes Intravenous Every 8 hours 07/12/19 2102 07/13/19 0516   07/03/19 2245  cefTRIAXone (ROCEPHIN) 2 g in sodium chloride 0.9 % 100 mL IVPB  Status:  Discontinued     2 g 200 mL/hr over 30 Minutes Intravenous Every 24 hours 07/03/19 2230 07/04/19 1336   07/03/19 2245  azithromycin (ZITHROMAX) 500 mg in sodium chloride 0.9 % 250 mL IVPB  Status:  Discontinued     500 mg 250 mL/hr over 60 Minutes Intravenous Every 24 hours 07/03/19 2230 07/05/19 1037     Subjective: Today, seen and examined at bedside.  Patient denies any nausea, vomiting or abdominal pain.  No shortness of breath cough fever.  Objective: Vitals:   08/21/19 0630 08/21/19 0700  BP:  (!) 143/91  Pulse:  85  Resp: 12 10  Temp:  (!) 97.3 F (36.3 C)  SpO2:      Intake/Output Summary (Last 24 hours) at 08/21/2019 0923 Last data filed at 08/20/2019 1000 Gross per 24 hour  Intake 30 ml  Output --  Net 30 ml   Filed Weights   08/19/19 1153 08/20/19 0316 08/21/19 0400  Weight: 39 kg 41.7 kg 38.6 kg   Body mass index is 13.72 kg/m.   Physical Exam: General: Thinly built built, not in obvious distress, alert awake communicative.  Deconditioned and frail.  Weak voice. HENT: Normocephalic, pupils equally reacting to light and accommodation.  No scleral pallor or icterus noted. Oral mucosa is moist.  Left internal jugular hemodialysis catheter in place Chest:  Clear breath sounds.  Diminished breath sounds bilaterally. No  crackles or wheezes.  CVS: S1 &S2 heard. No murmur.  Tachycardic. Abdomen: Soft, nontender, nondistended.  Bowel sounds are heard.    PEG tube in place. Extremities: Cold hands, cyanosis of the digits noted. Psych: Alert, awake and communicative.  Mildly anxious. CNS:  No cranial nerve deficits.  Power equal in all extremities.  No sensory deficits noted.  No cerebellar signs.   Skin: Warm and dry.  Cyanosis of the extremities.  Data Review: I have personally reviewed the following laboratory data and studies,  CBC: Recent Labs  Lab 08/15/19 0251 08/17/19 0303 08/18/19 0240 08/19/19 0650  WBC 8.2 6.4 6.7 4.9  HGB 12.9 13.3 12.4 12.6  HCT 43.9 45.2 42.7 42.9  MCV 94.0 93.0 93.8 93.1  PLT 174 119* 135* 127*   Basic Metabolic Panel: Recent Labs  Lab 08/17/19 0303 08/18/19 0240 08/19/19 0650 08/20/19 0302 08/21/19 0316  NA 132* 133* 131* 133* 126*  K 4.5 4.2 4.6 4.2 4.7  CL 92* 93* 87* 94* 87*  CO2 22 27 21* 24 22  GLUCOSE 97 124* 76 116* 100*  BUN 98* 59* 111* 58* 102*  CREATININE 3.23* 2.06* 3.22* 2.00* 3.01*  CALCIUM 9.5 9.0 9.2 9.2 9.1  MG  --  2.4  --   --   --   PHOS 6.8* 4.7* 6.1* 3.4 4.8*   Liver Function Tests: Recent Labs  Lab 08/17/19 0303 08/18/19 0240 08/19/19 0650 08/20/19 0302 08/21/19 0316  AST  --  50*  --   --   --   ALT  --  22  --   --   --   ALKPHOS  --  104  --   --   --   BILITOT  --  0.4  --   --   --   PROT  --  5.4*  --   --   --   ALBUMIN 3.1* 3.0* 3.0* 3.0* 2.7*   No results for input(s): LIPASE, AMYLASE in the last 168 hours. No results for input(s): AMMONIA in the last 168 hours. Cardiac Enzymes: No results for input(s): CKTOTAL, CKMB, CKMBINDEX, TROPONINI in the last 168 hours. BNP (last 3 results) Recent Labs    04/04/19 2000 07/03/19 1930 07/09/19 0347  BNP 76.7 1,124.1* 4,004.9*    ProBNP (last 3 results) No results for input(s): PROBNP in the last 8760 hours.  CBG: Recent Labs  Lab 08/20/19 1655 08/20/19 1913  08/20/19 2312 08/21/19 0410 08/21/19 0716  GLUCAP 113* 74 111* 95 102*   No results found for this or any previous visit (from the past 240 hour(s)).   Studies: No results found.  Scheduled Meds: . acetaminophen  500 mg Oral BID  . Chlorhexidine Gluconate Cloth  6 each Topical Q0600  . collagenase   Topical BID  . diclofenac Sodium  2 g Topical BID  . diltiazem  120 mg Oral Daily  . famotidine  10 mg Oral Daily  . feeding supplement (PRO-STAT SUGAR FREE 64)  30 mL Per Tube Daily  . ferric citrate  420 mg Oral TID WC  . gabapentin  100 mg Per Tube Q12H  . Gerhardt's butt cream   Topical TID  . heparin  5,000 Units Subcutaneous Q8H  . loratadine  10 mg Oral Daily  . mouth rinse  15 mL Mouth Rinse BID  . multivitamin  1 tablet Oral QHS  . neomycin-bacitracin-polymyxin   Topical BID  . nystatin  5 mL Mouth/Throat QID  . pantoprazole sodium  40 mg Per Tube BID  . [START ON 08/22/2019] predniSONE  50 mg Oral Q breakfast   Followed by  . [START ON 09/09/2019] predniSONE  40 mg Oral Q breakfast   Followed by  . [START ON 08/25/2019] predniSONE  30 mg Oral Q breakfast  . sertraline  25 mg Oral BID  . sodium chloride irrigation  15 mL Irrigation QID  . thiamine  100 mg Oral Daily    Continuous Infusions: . sodium chloride    . sodium chloride    . feeding supplement (NEPRO CARB STEADY) 1,000 mL (08/19/19 1612)    Flora Lipps, MD  [facility] 08/21/2019

## 2019-08-21 NOTE — Plan of Care (Signed)

## 2019-08-21 NOTE — Progress Notes (Signed)
Inpatient Rehab Admissions Coordinator:   Met with patient at bedside.  Awaiting clip for outpatient HD for possible admission pending acceptance.  Will follow.   Shann Medal, PT, DPT Admissions Coordinator [phone] 08/21/19  11:48 AM

## 2019-08-21 NOTE — H&P (Addendum)
Physical Medicine and Rehabilitation Admission H&P    Chief Complaint  Patient presents with  . Debility    HPI: Joanna Ali is a 69 year old female with history of hepatic steatosis, HTN, peripheral neuropathy, macular degeneration, progressive DOE with fluid overload since 02/2019 and history of pericardial/pleural effusion who was admitted on 07/03/2019 with AKI, anasarca and fatigue.  History taken from chart review and husband.  She has had negative work up by rheumatology and at [facility].  She was started on IV diuresis and cardiac echo showed moderately large pericardial effusion. Cardiac cath and cardiac MRI without evidence of pericardial constriction- negative for amyloidosis.  Dr Tera Mater consulted for assistance and she underwent R-VATS with pericardial window on 07/12/2019. She continued to require diuresis and had worsening of renal function with intermittent hyperkalemia and uremia with lethary requiring initiation of HD 07/20/2019. She has had poor po intake, difficulty swallowing, persistent tail bone pain with FTT. She had PEG placed by Dr. Anselm Pancoast on 07/28/2019.  Palliative care consulted for [place] and questioned a trial of steroids empirically due to concerns of carcinoid or multisystem inflammatory syndrome. She has had bouts of agitation and nephrology expressed concerns of steroids making anxiety worse. Question of transfer to [facility] (no beds). Rheumatology/ Dr. Kathlene November consulted for input on autoimmune process and she was started on high dose steroids due to consideration of scleroderma with CREST syndrome.     Anti-RNA polymerase III antibody positive. Repeat SPEP negative for monoclonal protein. Dr. Jana Hakim consulted for input to rule out occult malignancy as she developed leucopenia. He  questioned possibility of malignancy with scleroderma as secondary.  CT abdomen/pelvis/chest negative and pleural fluid negative for malignancy--he felt that further screening for malignancy can be  done on outpatient basis.  She underwent repeat thoracocentesis on 08/01/2019 positive for mesothelial cells--evaluate on outpatient basis once stronger.   PAF with RVR on 08/13/2019 treated with metoprolol for rate control and Dr. Margaretann Loveless  --not a candidate for coumadin or amiodarone per cardiology. Dr. Scot Dock consulted due to ischemic fingers --patient with positive pulses with negative BLE dopplers and changes felt to be due to scleroderma/small vessel disease. Metoprolol changed to Cardizem due to worsening of Raynaud's phenomenon. She was started on slow steroid taper on 1/26--to decrease by 10 mg per week to 30 mg daily with follow up with Rheum. Patient with AKI with anuria requiring HD TTS.  Please see preadmission assessment from earlier today.  Review of Systems  Constitutional: Positive for malaise/fatigue. Negative for chills and fever.  Musculoskeletal: Positive for back pain, joint pain and myalgias.  Neurological: Positive for weakness. Negative for sensory change and speech change.  All other systems reviewed and are negative.  Past Medical History:  Diagnosis Date  . Hypertension   . Leg swelling 03/2019  . Short of breath on exertion 03/2019    Past Surgical History:  Procedure Laterality Date  . APPENDECTOMY    . IR FLUORO GUIDE CV LINE LEFT  07/18/2019  . IR GASTROSTOMY TUBE MOD SED  07/28/2019  . IR US GUIDE VASC ACCESS LEFT  07/18/2019  . NECK SURGERY    . RIGHT AND LEFT HEART CATH N/A 07/06/2019   Procedure: RIGHT AND LEFT HEART CATH;  Surgeon: Jolaine Artist, MD;  Location: [facility];  Service: Cardiovascular;  Laterality: N/A;  . TEE WITHOUT CARDIOVERSION N/A 03/28/2019   Procedure: TRANSESOPHAGEAL ECHOCARDIOGRAM (TEE);  Surgeon: Geralynn Rile, MD;  Location: [facility];  Service: Cardiology;  Laterality: N/A;  . VIDEO ASSISTED THORACOSCOPY Right 07/12/2019   Procedure: VIDEO ASSISTED THORACOSCOPY Pericardial window ;  Surgeon: Lajuana Matte, MD;  Location: [facility];  Service: Thoracic;  Laterality: Right;    Family History  Problem Relation Age of Onset  . Hypertension Mother     Social History:  Married. Retired Optometrist. She reports that she quit smoking about 40 years ago. Her smoking use included cigarettes. [name]. She has never used smokeless tobacco. [name]--used to drink 3-4 glasses of wine daily. .    Allergies: No Known Allergies    Medications Prior to Admission  Medication Sig Dispense Refill  . gabapentin (NEURONTIN) 300 MG capsule Take 300 mg by mouth 3 (three) times daily.     . Multiple Vitamins-Minerals (MULTIVITAMIN WITH MINERALS) tablet Take 1 tablet by mouth daily.    . Multiple Vitamins-Minerals (PRESERVISION AREDS 2) CAPS Take 1 capsule by mouth 2 (two) times daily.     Marland Kitchen Propylene Glycol (SYSTANE BALANCE) 0.6 % SOLN Place 1 drop into both eyes 2 (two) times daily as needed (dry eyes).    Marland Kitchen senna (SENOKOT) 8.6 MG TABS tablet Take 1-3 tablets by mouth See admin instructions. Taking 1 tablet in the am and 2 tabs at bedtime    . sertraline (ZOLOFT) 25 MG tablet Take 25 mg by mouth 2 (two) times daily.     Marland Kitchen spironolactone (ALDACTONE) 100 MG tablet Take 100 mg by mouth daily.     Marland Kitchen torsemide (DEMADEX) 20 MG tablet Take 20 mg by mouth daily.     . traMADol (ULTRAM) 50 MG tablet Take 50 mg by mouth every 6 (six) hours as needed for moderate pain.     . TURMERIC PO Take 1 capsule by mouth daily.       Drug Regimen Review  Drug regimen was reviewed and remains appropriate with no significant issues identified  Home: Home Living Family/patient expects to be discharged to:: Private residence Living Arrangements: Spouse/significant other Available Help at Discharge: Family, Available 24 hours/day Type of Home: House Home Access: Stairs to enter [employer] of Steps: 3 Entrance Stairs-Rails: None Home Layout:  Two level, Able to live on main level with bedroom/bathroom Bathroom Shower/Tub: Multimedia programmer: Standard Home Equipment: Cane - single point, Other (comment), Toilet riser, Shower seat, Hand held shower head, Bedside commode Additional Comments: cushion for toilet   Functional History: Prior Function Level of Independence: Independent with assistive device(s) Comments: Uses SPC for ambulation. Sits to shower. Uses BSC for night time. Gets help with shoes/socks. Spouse does IADLs.  Functional Status:  Mobility: Bed Mobility Overal bed mobility: Needs Assistance Bed Mobility: Rolling, Sidelying to Sit Rolling: Min assist Sidelying to sit: Mod assist Supine to sit: Mod assist, HOB elevated Sit to supine: Total assist, HOB elevated General bed mobility comments: pt OOB in recliner upon PT arrival Transfers Overall transfer level: Needs assistance Equipment used: Rolling walker (2 wheeled), 2 person hand held assist Transfers: Sit to/from Stand Sit to Stand: Min assist Stand pivot transfers: Mod assist General transfer comment: minA progressing to modA of 1 with pt fatigue, completed x5 through session. pt initially prefered HHA of 2, then requesting RW Ambulation/Gait Ambulation/Gait assistance: Min guard Gait Distance (Feet): 8 Feet(8 ft x 2) Assistive device: Rolling walker (2 wheeled) Gait Pattern/deviations: Trunk flexed, Shuffle, Step-to pattern General Gait Details: pt with sig short shuffle  steps, progressively incresed trunk flexion with fatigue/fear Gait velocity: decreased Gait velocity interpretation: <1.8 ft/sec, indicate of risk for recurrent falls    ADL: ADL Overall ADL's : Needs assistance/impaired Eating/Feeding: Minimal assistance, Sitting Eating/Feeding Details (indicate cue type and reason): issued foam build ups for eating utensils and practice use with pt's ice and magic cup Grooming: Sitting, Wash/dry hands, Wash/dry face, Moderate  assistance Grooming Details (indicate cue type and reason): decreased thoroughness Upper Body Bathing: Maximal assistance, Sitting Lower Body Bathing: Total assistance, +2 for physical assistance, Sit to/from stand Upper Body Dressing : Maximal assistance, Sitting Upper Body Dressing Details (indicate cue type and reason): front opening gown Lower Body Dressing: Total assistance, Bed level Toilet Transfer: Maximal assistance, Control and instrumentation engineer Details (indicate cue type and reason): face to face Toileting- Clothing Manipulation and Hygiene: Total assistance Functional mobility during ADLs: Moderate assistance, +2 for safety/equipment, Rolling walker General ADL Comments: requiring total assist   Cognition: Cognition Overall Cognitive Status: Impaired/Different from baseline Orientation Level: Oriented X4 Cognition Arousal/Alertness: Awake/alert Behavior During Therapy: WFL for tasks assessed/performed(smiling more) Overall Cognitive Status: Impaired/Different from baseline Area of Impairment: Problem solving, Safety/judgement, Following commands Current Attention Level: Sustained Following Commands: Follows one step commands with increased time Safety/Judgement: Decreased awareness of safety, Decreased awareness of deficits Awareness: Intellectual, Emergent Problem Solving: Slow processing General Comments: pt reports significant fear and anxiety related to movement and "not being able to do it" when asked to progress mobility. Pt reporting fatigue and anxiety with most movement, asking for prolonged rest breaks. Able to be redirected and encouraged to relax with guided breathing. Difficult to assess due to: Impaired communication   Blood pressure (!) 141/85, pulse (!) 102, temperature 97.6 F (36.4 C), temperature source Axillary, resp. rate (!) 26, height 5' 6"  (1.676 m), weight 41.3 kg, SpO2 100 %. Physical Exam  Vitals reviewed. Constitutional: She appears  well-developed.  Emaciated.  HENT:  Head: Normocephalic and atraumatic.  Eyes: EOM are normal. Right eye exhibits no discharge. Left eye exhibits no discharge.  Neck: No tracheal deviation present. No thyromegaly present.  Respiratory: Effort normal. No stridor. No respiratory distress.  GI: She exhibits no distension.  Musculoskeletal:     Comments: No edema or tenderness in extremities  Neurological: She is alert.  Motor: Bilateral upper extremities: 3+-4-/5 proximal distal Bilateral lower extremities: Hip flexion 3+-4-/5, knee extension 3 -/5 (limited due to contractures), ankle dorsiflexion 3-3+/5 Sensation intact light touch  Skin:  Scattered abrasions Peripheral cyanosis  Psychiatric: Her affect is blunt. Her speech is delayed. She is slowed.    Results for orders placed or performed during the hospital encounter of 07/03/19 (from the past 48 hour(s))  Glucose, capillary     Status: Abnormal   Collection Time: 08/21/19  4:19 PM  Result Value Ref Range   Glucose-Capillary 148 (H) 70 - 99 mg/dL  Glucose, capillary     Status: Abnormal   Collection Time: 08/21/19  7:43 PM  Result Value Ref Range   Glucose-Capillary 68 (L) 70 - 99 mg/dL  Glucose, capillary     Status: Abnormal   Collection Time: 08/21/19  7:47 PM  Result Value Ref Range   Glucose-Capillary 125 (H) 70 - 99 mg/dL  Glucose, capillary     Status: Abnormal   Collection Time: 08/21/19 11:46 PM  Result Value Ref Range   Glucose-Capillary 119 (H) 70 - 99 mg/dL   Comment 1 Notify RN    Comment 2 Document in Chart   Renal function  panel     Status: Abnormal   Collection Time: 08/22/19  3:16 AM  Result Value Ref Range   Sodium 124 (L) 135 - 145 mmol/L   Potassium 5.3 (H) 3.5 - 5.1 mmol/L   Chloride 83 (L) 98 - 111 mmol/L   CO2 23 22 - 32 mmol/L   Glucose, Bld 106 (H) 70 - 99 mg/dL   BUN 142 (H) 8 - 23 mg/dL   Creatinine, Ser 3.83 (H) 0.44 - 1.00 mg/dL   Calcium 9.1 8.9 - 10.3 mg/dL   Phosphorus 4.7 (H) 2.5 -  4.6 mg/dL   Albumin 2.8 (L) 3.5 - 5.0 g/dL   GFR calc non Af Amer 11 (L) >60 mL/min   GFR calc Af Amer 13 (L) >60 mL/min   Anion gap 18 (H) 5 - 15    Comment: Performed at [facility], [address], [place], [place] [address]  CBC     Status: Abnormal   Collection Time: 08/22/19  3:16 AM  Result Value Ref Range   WBC 3.1 (L) 4.0 - 10.5 K/uL   RBC 4.31 3.87 - 5.11 MIL/uL   Hemoglobin 11.8 (L) 12.0 - 15.0 g/dL   HCT 38.8 36.0 - 46.0 %   MCV 90.0 80.0 - 100.0 fL   MCH 27.4 26.0 - 34.0 pg   MCHC 30.4 30.0 - 36.0 g/dL   RDW 19.7 (H) 11.5 - 15.5 %   Platelets 127 (L) 150 - 400 K/uL   nRBC 0.0 0.0 - 0.2 %    Comment: Performed at [facility], [place] [address], [place], [place] [address]  Glucose, capillary     Status: Abnormal   Collection Time: 08/22/19  3:46 AM  Result Value Ref Range   Glucose-Capillary 115 (H) 70 - 99 mg/dL   Comment 1 Notify RN    Comment 2 Document in Chart   Glucose, capillary     Status: Abnormal   Collection Time: 08/22/19 11:32 AM  Result Value Ref Range   Glucose-Capillary 143 (H) 70 - 99 mg/dL   Comment 1 Notify RN    Comment 2 Document in Chart   Glucose, capillary     Status: Abnormal   Collection Time: 08/22/19  4:34 PM  Result Value Ref Range   Glucose-Capillary 155 (H) 70 - 99 mg/dL   Comment 1 Notify RN    Comment 2 Document in Chart   Glucose, capillary     Status: Abnormal   Collection Time: 08/22/19  7:32 PM  Result Value Ref Range   Glucose-Capillary 143 (H) 70 - 99 mg/dL   Comment 1 Notify RN    Comment 2 Document in Chart   Glucose, capillary     Status: Abnormal   Collection Time: 08/22/19 11:44 PM  Result Value Ref Range   Glucose-Capillary 106 (H) 70 - 99 mg/dL   Comment 1 Notify RN    Comment 2 Document in Chart   Renal function panel     Status: Abnormal   Collection Time: 08/23/19  2:59 AM  Result Value Ref Range   Sodium 133 (L) 135 - 145 mmol/L   Potassium 4.7 3.5 - 5.1 mmol/L   Chloride 93 (L) 98 - 111  mmol/L   CO2 24 22 - 32 mmol/L   Glucose, Bld 122 (H) 70 - 99 mg/dL   BUN 72 (H) 8 - 23 mg/dL   Creatinine, Ser 2.33 (H) 0.44 - 1.00 mg/dL    Comment: DELTA  CHECK NOTED   Calcium 8.8 (L) 8.9 - 10.3 mg/dL   Phosphorus 2.8 2.5 - 4.6 mg/dL   Albumin 2.8 (L) 3.5 - 5.0 g/dL   GFR calc non Af Amer 21 (L) >60 mL/min   GFR calc Af Amer 24 (L) >60 mL/min   Anion gap 16 (H) 5 - 15    Comment: Performed at [facility] [address] [place], [place] [address]  CBC     Status: Abnormal   Collection Time: 08/23/19  2:59 AM  Result Value Ref Range   WBC 3.3 (L) 4.0 - 10.5 K/uL   RBC 4.63 3.87 - 5.11 MIL/uL   Hemoglobin 12.7 12.0 - 15.0 g/dL   HCT 44.2 36.0 - 46.0 %   MCV 95.5 80.0 - 100.0 fL   MCH 27.4 26.0 - 34.0 pg   MCHC 28.7 (L) 30.0 - 36.0 g/dL   RDW 20.7 (H) 11.5 - 15.5 %   Platelets 128 (L) 150 - 400 K/uL   nRBC 0.0 0.0 - 0.2 %    Comment: Performed at [facility], [place] [address] [place], [place] [address]  Glucose, capillary     Status: Abnormal   Collection Time: 08/23/19  3:24 AM  Result Value Ref Range   Glucose-Capillary 125 (H) 70 - 99 mg/dL   Comment 1 Notify RN    Comment 2 Document in Chart    No results found.     Medical Problem List and Plan: 1.  Deficits with mobility, transfers, endurance, self-care secondary to debility.   -patient may may shower  -ELOS/Goals: 13-17 days/Supervision  Admit to CIR 2.  Antithrombotics: -DVT/anticoagulation:  Pharmaceutical: Heparin  -antiplatelet therapy: N/A 3. Peripheral neuropathy/Pain Management:  Tylenol bid with oxycodone prn.   4. Mood: Team to provide ego support. LCSW to follow for evaluation and support.   -antipsychotic agents: N/A 5. Neuropsych: This patient is not fully capable of making decisions on her own behalf. 6. Skin/Wound Care: Santyl with damp to dry dressing for sacral decub. Add Vitamin C/Zinc for healing. ON tube feeds for nutritional support due to FTT. Air mattress overlay.  7.  Fluids/Electrolytes/Nutrition: Strict I/O with daily weights. Regal diet with 1500 cc FR.  8. 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 Cont Cardizem. 9. A fib with RVR: [facility]. Monitor HR tid --rate controlled on Cardizem.  Monitor with increased mobility.  10. Leucopenia: Stable--follow up with Heme/onc for work up after discharge.    CBC ordered for tomorrow. 11. Metabolic encephalopathy: Treated with IV thiamine and has improved with decrease in narcotics/sedatives as well as initiation of HD 12. ESRD: HD dependent due to progression of AKI. [facility] 13. Pericardial effusion/Pleural effusion:  S/p pericardial window 12/2. S/p thoracocentesis  14. Hepatic Steatosis: Abdominal pain with pruritis. [facility]   CMP ordered for tomorrow AM 15. Malnutrition: Encourage po intake as able. Continue tube feeds.  16. Anemia of chronic disease:  CBC ordered for tomorrow AM 17. Anxiety disorder with panic attacks: On Zoloft with alprazolam tid prn.  18. Thrombocytopenia: Stable without signs of bleeding.   CBC ordered for tomorrow AM  PaBary LerichePA-C 08/23/2019  I have personally performed a face to face diagnostic evaluation, including, but not limited to relevant history and physical exam findings, of  this patient and developed relevant assessment and plan.  Additionally, I have reviewed and concur with the physician assistant's documentation above.  Delice Lesch, MD, ABPMR

## 2019-08-21 NOTE — Plan of Care (Signed)
?  Problem: Clinical Measurements: ?Goal: Ability to maintain clinical measurements within normal limits will improve ?Outcome: Progressing ?Goal: Will remain free from infection ?Outcome: Progressing ?Goal: Diagnostic test results will improve ?Outcome: Progressing ?  ?

## 2019-08-21 NOTE — Progress Notes (Signed)
  [facility] Progress Note   Assessment/ Plan:   1. 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  Appreciate assistance from rheumatology phone consultation 2.  AKI: requiring HD, next planned 08/22/19.  Will go for healthy UF goal given hypoNa 3.  Pericardial effusion: s/p pericardial window 12/23 4.  Hepatic steatosis: [facility] at [facility] 5.  Malnutrition: on TF, got PEG 6.  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  Anemia: ESA and iron as appropriate 8.  Afib: poor candidate for [facility], cardiology following 9.  Dispo: pending   Subjective:    Seen in room, in bed.  Feeling "better"  Fingertips are bluish- says this is not new. Last HD Saturday.     Objective:   BP (!) 143/91 (BP Location: Left Arm)   Pulse 85   Temp (!) 97.3 F (36.3 C) (Axillary)   Resp 10   Ht 5' 6"  (1.676 m)   Wt 38.6 kg   SpO2 98%   BMI 13.72 kg/m   Physical Exam: Gen: older woman, in bed, frail CVS: RRR no rub Resp: bibasilar crackles Abd: soft, slightly distended NABS Ext: trace LE edema, some flank edema  Labs: BMET Recent Labs  Lab 08/15/19 0251 08/16/19 0257 08/17/19 0303 08/18/19 0240 08/19/19 0650 08/20/19 0302 08/21/19 0316  NA 135 134* 132* 133* 131* 133* 126*  K 5.2* 4.3 4.5 4.2 4.6 4.2 4.7  CL 93* 94* 92* 93* 87* 94* 87*  CO2 22 26 22 27  21* 24 22  GLUCOSE 89 98 97 124* 76 116* 100*  BUN 128* 56* 98* 59* 111* 58* 102*  CREATININE 3.50* 2.16* 3.23* 2.06* 3.22* 2.00* 3.01*  CALCIUM 9.5 9.1 9.5 9.0 9.2 9.2 9.1  PHOS 4.9* 4.7* 6.8* 4.7* 6.1* 3.4 4.8*   CBC Recent Labs  Lab 08/15/19 0251 08/17/19 0303 08/18/19 0240 08/19/19 0650  WBC 8.2 6.4 6.7 4.9  HGB 12.9 13.3 12.4 12.6  HCT 43.9 45.2 42.7 42.9  MCV 94.0 93.0 93.8 93.1  PLT 174 119* 135* 107*      Medications:    . acetaminophen  500 mg Oral BID  . Chlorhexidine Gluconate Cloth  6 each Topical Q0600  . collagenase   Topical BID  . diclofenac Sodium  2 g Topical BID  . diltiazem   120 mg Oral Daily  . famotidine  10 mg Oral Daily  . feeding supplement (PRO-STAT SUGAR FREE 64)  30 mL Per Tube Daily  . ferric citrate  420 mg Oral TID WC  . gabapentin  100 mg Per Tube Q12H  . Gerhardt's butt cream   Topical TID  . heparin  5,000 Units Subcutaneous Q8H  . loratadine  10 mg Oral Daily  . mouth rinse  15 mL Mouth Rinse BID  . multivitamin  1 tablet Oral QHS  . neomycin-bacitracin-polymyxin   Topical BID  . nystatin  5 mL Mouth/Throat QID  . pantoprazole sodium  40 mg Per Tube BID  . [START ON 08/22/2019] predniSONE  50 mg Oral Q breakfast   Followed by  . [START ON 08/30/2019] predniSONE  40 mg Oral Q breakfast   Followed by  . [START ON 08/22/2019] predniSONE  30 mg Oral Q breakfast  . sertraline  25 mg Oral BID  . sodium chloride irrigation  15 mL Irrigation QID  . thiamine  100 mg Oral Daily     Madelon Lips, MD 08/21/2019, 9:25 AM

## 2019-08-22 LAB — CBC
HCT: 38.8 % (ref 36.0–46.0)
Hemoglobin: 11.8 g/dL — ABNORMAL LOW (ref 12.0–15.0)
MCH: 27.4 pg (ref 26.0–34.0)
MCHC: 30.4 g/dL (ref 30.0–36.0)
MCV: 90 fL (ref 80.0–100.0)
Platelets: 127 10*3/uL — ABNORMAL LOW (ref 150–400)
RBC: 4.31 MIL/uL (ref 3.87–5.11)
RDW: 19.7 % — ABNORMAL HIGH (ref 11.5–15.5)
WBC: 3.1 10*3/uL — ABNORMAL LOW (ref 4.0–10.5)
nRBC: 0 % (ref 0.0–0.2)

## 2019-08-22 LAB — GLUCOSE, CAPILLARY
Glucose-Capillary: 106 mg/dL — ABNORMAL HIGH (ref 70–99)
Glucose-Capillary: 115 mg/dL — ABNORMAL HIGH (ref 70–99)
Glucose-Capillary: 143 mg/dL — ABNORMAL HIGH (ref 70–99)
Glucose-Capillary: 143 mg/dL — ABNORMAL HIGH (ref 70–99)
Glucose-Capillary: 155 mg/dL — ABNORMAL HIGH (ref 70–99)

## 2019-08-22 LAB — RENAL FUNCTION PANEL
Albumin: 2.8 g/dL — ABNORMAL LOW (ref 3.5–5.0)
Anion gap: 18 — ABNORMAL HIGH (ref 5–15)
BUN: 142 mg/dL — ABNORMAL HIGH (ref 8–23)
CO2: 23 mmol/L (ref 22–32)
Calcium: 9.1 mg/dL (ref 8.9–10.3)
Chloride: 83 mmol/L — ABNORMAL LOW (ref 98–111)
Creatinine, Ser: 3.83 mg/dL — ABNORMAL HIGH (ref 0.44–1.00)
GFR calc Af Amer: 13 mL/min — ABNORMAL LOW (ref >60)
GFR calc non Af Amer: 11 mL/min — ABNORMAL LOW (ref >60)
Glucose, Bld: 106 mg/dL — ABNORMAL HIGH (ref 70–99)
Phosphorus: 4.7 mg/dL — ABNORMAL HIGH (ref 2.5–4.6)
Potassium: 5.3 mmol/L — ABNORMAL HIGH (ref 3.5–5.1)
Sodium: 124 mmol/L — ABNORMAL LOW (ref 135–145)

## 2019-08-22 MED ORDER — POLYETHYLENE GLYCOL 3350 17 G PO PACK
17.0000 g | PACK | Freq: Every day | ORAL | Status: DC
  Administered 2019-08-23: 17 g via ORAL
  Filled 2019-08-22: qty 1

## 2019-08-22 MED ORDER — SENNOSIDES-DOCUSATE SODIUM 8.6-50 MG PO TABS
1.0000 | ORAL_TABLET | Freq: Two times a day (BID) | ORAL | Status: DC
  Administered 2019-08-22 – 2019-08-23 (×2): 1 via ORAL
  Filled 2019-08-22 (×2): qty 1

## 2019-08-22 MED ORDER — HEPARIN SODIUM (PORCINE) 1000 UNIT/ML IJ SOLN
INTRAMUSCULAR | Status: AC
  Administered 2019-08-22: 10:00:00 3800 [IU] via INTRAVENOUS_CENTRAL
  Filled 2019-08-22: qty 4

## 2019-08-22 NOTE — Progress Notes (Signed)
Inpatient Rehab Admissions Coordinator:   I do not have a bed available for this patient today.  CLIP still pending.  Will follow.   Shann Medal, PT, DPT Admissions Coordinator [phone] 08/22/19  12:35 PM

## 2019-08-22 NOTE — Progress Notes (Addendum)
Nutrition Follow-up  DOCUMENTATION CODES:   Severe malnutrition in context of chronic illness, Underweight  INTERVENTION:   Continue tube feeding viaG-tube: - Nepro @ 45 ml/hr (1058m/day) - Pro-stat 30 ml daily  Tube feeding regimen provides2044kcal, 102grams of protein, and 7819mof H2O, meets 100% of needs.  NUTRITION DIAGNOSIS:   Severe Malnutrition related to chronic illness (pericardial effusion w/ volume overload) as evidenced by moderate fat depletion, severe muscle depletion, percent weight loss.  Ongoing  GOAL:   Patient will meet greater than or equal to 90% of their needs  Addressed via TF   MONITOR:   PO intake, Supplement acceptance, Labs, TF tolerance, Weight trends, I & O's  REASON FOR ASSESSMENT:   Consult Assessment of nutrition requirement/status  ASSESSMENT:   Patient with PMH significant for chronic pericardial effusion, moderate AVR, HTN, hepatic steatosis, and peripheral neuropathy. Presents this admission with anasarca and bilateral pleural effusions.  12/15- s/p bilateral thoracentesiswith700 ml fluid removed 12/17- s/p cardiac cath 12/23- s/p VATS, pericardial window 12/30- Cortrak placed, tip in stomach 01/7- TF switched to Nepro due to persistent hyperkalemia, Cortrak removed 01/8- s/p G-tube placement 01/9- steroids initiated 01/12- s/p bilateral thoracentesis with 1600 ml total fluid removed  Pt tolerating tube feeding at goal rate. Is now hyponatremic. PO intake remains poor. Meal completions charted as 0-30% for her last eight meals (10% average). Had a couple bites of pecan pie and ice cream last night per husband. No BM charted in one week. Messaged MD about adding bowel regimen.   Husband inquiring about bolus feedings. Plan for pt d/c to CIR. Will keep continuous for now and possibly make transition once in CIR.   Admission weight: 51.9 kg  Current weight: 39.9 kg (down 3 kg in the last week)  I/O: -520 ml since  1/19 Last HD today: 2156 ml x 24 hrs   Medications: rena-vit, prednisone, thiamine  Labs: Na 124 (L) K 5.3 (H) Phosphorus 4.7 CBG 68-148  Diet Order:   Diet Order            Diet regular Room service appropriate? Yes; Fluid consistency: Thin; Fluid restriction: 1500 mL Fluid  Diet effective now              EDUCATION NEEDS:   Education needs have been addressed  Skin:  Skin Assessment: Skin Integrity Issues: Stage II: sacrum, bilateral nares Incisions: chest, left neck Other: cracking/dryness to BLE  Last BM:  1/26  Height:   Ht Readings from Last 1 Encounters:  07/04/19 5' 6"  (1.676 m)    Weight:   Wt Readings from Last 1 Encounters:  08/22/19 39.9 kg    Ideal Body Weight:  59.1 kg  BMI:  Body mass index is 14.2 kg/m.  Estimated Nutritional Needs:   Kcal:  1800-2000  Protein:  90-105 grams  Fluid:  1000 ml + UOP   CaMariana SingleD, LDN Clinical Nutrition Pager # - [phone]

## 2019-08-22 NOTE — Progress Notes (Addendum)
PROGRESS NOTE  Joanna Ali IRC:[id number] DOB: Dec 07, 1950 DOA: 07/03/2019 PCP: Renaldo Reel, DO   LOS: 49 days   Brief narrative: As per HPI,  69 year old female with a history of hypertension, chronic pericardial effusion, moderate aortic valve regurgitation, former smoker, alcohol abuse initially admitted on 07/06/2019 for progressive shortness of breath with anasarca and fatigue.  She was previously worked up by [facility] rheumatology with negative work-up including ESR, lupus anticoagulant, rheumatoid factor, TSH, ANCA, ANA, light free chain studies.  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  Since that time, patient had stopped drinking alcohol as advised.  She has been on diuretics and spironolactone without any improvement in symptoms.  CT of the chest on 07/04/2019 noted moderate pericardial effusion and airspace disease.  She underwent thoracentesis by PCCM which removed 700 mL of the fluid.  Patient underwent right heart cath on 07/06/2019 and pericardial window on 07/12/2019.  Attempts were made to diurese with IV diuretics but patient with worsening kidney function with creatinine up to 5.  Nephrology was formally consulted patient was started on dialysis.  She has been having poor p.o. intake so cortrak tube was placed.  She was then later placed on PEG tube for persistent poor p.o. intake, also patient developed oral pain after NG tube placement.  She is on hemodialysis for AKI with rapid progression to end-stage renal disease, severe anasarca, pericardial effusion, status post pericardial window.    Her labs showed RF positive at 71, INT RNA polymerase antibody +146 concern for scleroderma also she had some thickening of the skin with renal failure and pericardial involvement.  Pulmonary was consulted to evaluate for ILD. On discussion  with rheumatology at [facility] who recommended supportive care, acid reflux treated with PPI, joint pain Voltaren gel and Tylenol.  Prednisone taper.  For Raynaud's phenomenon calcium channel blocker.  If patient has interstitial lung disease then she should be evaluated for immunotherapy can be as outpatient when stronger.  Rheumatology did not recommend immunotherapy for just joint pain and skin manifestation.  Dr. Kathlene November, rheumatologist was consulted on phone.  He recommended to start high-dose IV Solu-Medrol 125 mg daily for 3 days and then prednisone 60 mg daily for week then prednisone 50 mg daily for a week then prednisone 40 mg daily for a week then prednisone 30 mg daily for a week which can be continued till seen by rheumatologist as outpatient.  Assessment/Plan:  Principal Problem:   Anasarca Active Problems:   Pericardial effusion   Hypertension   Elevated troponin   Atypical pneumonia   Hypoxia   Protein-calorie malnutrition, severe   Pressure injury of skin   Failure to thrive in adult   Goals of care, counseling/discussion   Dysphagia   Muscular weakness   Thrush of mouth and esophagus (HCC)   ESRD on hemodialysis (HCC)   Dyspnea   S/P thoracentesis   Atrial fibrillation with RVR (HCC)   AKI (acute kidney injury) ([place])   Scleroderma progressive (HCC)   Rheumatoid arthritis (HCC)   CREST syndrome (HCC)  Nausea, sickness during hemodialysis. . None today.   Xanax prior to hemodialysis  Mild hyponatremia, borderline hyperkalemia, we will close monitor.  Patient is getting hemodialysis today.  Scleroderma /anasarca/joint pain/pericardial effusion-patient presented with anasarca, failure to thrive, joint pains, pericardial effusion, Raynaud's phenomenon.  She had positive anti-RNA polymerase 3 antibody.  Rheumatology evaluation in September 2020 showed CRP 13, ESR 25, ANA negative, c-ANCA/p-ANCA negative, scleroderma SCL 70 -, SSS (ro) negative, M spike negative  electrophoresis, HIV negative.  Double-stranded DNA negative, ANA negative, RF 71.      Rheumatologist Dr. Kathlene November,  recommended  IV Solu-Medrol 125 mg for 3 day followed by 60 mg prednisone daily for a week then wean down to 50 mg daily for a week then 40 mg daily for a week then 30 mg daily.  Patient can then continue with prednisone 30 mg daily and follow-up with rheumatology as outpatient.  On prednisone 60-milligram daily for 1 week from 08/14/2019.  Raynaud's phenomenon/crest syndrome with cold hands/ peripheral cyanosis, likely due to underlying scleroderma.  Beta-blocker has been discontinued and switched to Cardizem. Vascular surgery has been consulted for this- impression is Raynaud's phenomena and no further intervention is planned.  Atrial fibrillation RVR.  Patient is not a good candidate for anticoagulation considering multiple comorbid conditions, pericardial window, ESRD and frailty.  Due to worsening Raynaud's phenomena, beta-blockers were changed to calcium channel blocker.    Leukopenia.   CT chest  was negative, CT abdomen/pelvis on 1/16 was unremarkable , repeat SPEP showed no monoclonal protein with normal kappa lambda ratio.  She also had negative pleural fluid cytology x4.    Most recent WBC count of 4.9>3.1.  No fever  Acute kidney injury now on hemodialysis due to progressive deterioration of renal function and anuria.  Nephrology on board.  On hemodialysis.  Continue hemodialysis Tuesday, Thursday and Saturday.  Renal navigator on board.  Lethargy, metabolic encephalopathy-improved after hemodialysis and cutting down on narcotics. Patient also received IV thiamine.  To avoid narcotic sedatives  Acute hypoxic respiratory failure/pulmonary edema/pleural effusion-improved. On room air currently. Underwent repeat thoracentesis on 08/01/2019.  Removed 1.1 L from left and 500 mL from right.  Pleural cytology showed reactive mesothelial cells.  Pleural fluid culture showed no  growth.   Pericardial effusion with elevated troponin- Patient is status post pericardial window on 07/12/2019.  Pathology showed no malignancy, chronic inflammation, AFB negative.  Fungal culture  Negative. Cardiology on board.  Atypical pneumonia with hypoxia-completed antibiotic treatment.  Currently on room air  Severe malnutrition/failure to thrive- seen by speech therapy and has been started on regular diet.    Dysphagia/thrush-group A strep was negative on 07/28/2019, patient currently on oral nystatin  Anxiety-continue Zoloft, Xanax 0.25 mg 3 times daily as needed for panic attack.    Back pain-continue with oxycodone as needed, Neurontin, Voltaren gel twice a day.  Hypertension-continue Cardizem  Sacral decubitus ulcer stage II present on admission.  Continue wound care.  VTE Prophylaxis: Heparin subq  Code Status: Full code  Family Communication:   None today.  Disposition Plan:   Likely to CIR, pending arrangement.  Patient is medically stable for disposition  Consultants:  Oncology  Nephrology  Palliative care  PMR  Procedures:  Hemodialysis  Thoracocentesis   right heart cath on 07/06/2019    pericardial window on 07/12/2019.  PEG tube placement  Antibiotics: None currently  Anti-infectives (From admission, onward)   Start     Dose/Rate Route Frequency Ordered Stop   07/29/19 1500  fluconazole (DIFLUCAN) tablet 200 mg     200 mg Oral Daily 07/29/19 1458 08/11/19 2116   07/28/19 1300  ceFAZolin (ANCEF) 2-4 GM/100ML-% IVPB    Note to Pharmacy: Kandy Garrison   : cabinet override      07/28/19 1300 07/28/19 1806   07/28/19  0700  ceFAZolin (ANCEF) IVPB 2g/100 mL premix     2 g 200 mL/hr over 30 Minutes Intravenous On call 07/27/19 1502 07/28/19 1340   07/18/19 1316  ceFAZolin (ANCEF) 2-4 GM/100ML-% IVPB    Note to Pharmacy: Manuela Neptune   : cabinet override      07/18/19 1316 07/18/19 1318   07/12/19 2115  ceFAZolin (ANCEF) IVPB 1  g/50 mL premix     1 g 100 mL/hr over 30 Minutes Intravenous Every 8 hours 07/12/19 2102 07/13/19 0516   07/03/19 2245  cefTRIAXone (ROCEPHIN) 2 g in sodium chloride 0.9 % 100 mL IVPB  Status:  Discontinued     2 g 200 mL/hr over 30 Minutes Intravenous Every 24 hours 07/03/19 2230 07/04/19 1336   07/03/19 2245  azithromycin (ZITHROMAX) 500 mg in sodium chloride 0.9 % 250 mL IVPB  Status:  Discontinued     500 mg 250 mL/hr over 60 Minutes Intravenous Every 24 hours 07/03/19 2230 07/05/19 1037     Subjective: Today, patient was seen and examined at bedside during dialysis.  Denies any nausea during dialysis.  Denies any chest pain, shortness of breath or fever  Objective: Vitals:   08/22/19 0900 08/22/19 0930  BP: 103/71 91/77  Pulse: (!) 108 (!) 112  Resp:    Temp:    SpO2:      Intake/Output Summary (Last 24 hours) at 08/22/2019 1028 Last data filed at 08/21/2019 1900 Gross per 24 hour  Intake 1230 ml  Output --  Net 1230 ml   Filed Weights   08/21/19 0400 08/22/19 0611 08/22/19 0655  Weight: 38.6 kg 38.6 kg 42.2 kg   Body mass index is 15.01 kg/m.   Physical Exam:  General: Thinly built, not in obvious distress, alert awake communicative.  Deconditioned and frail.  Weak voice. HENT:   No scleral pallor or icterus noted. Oral mucosa is moist.  Left internal jugular hemodialysis catheter in place Chest:  Clear breath sounds.  Diminished breath sounds bilaterally.  CVS: S1 &S2 heard. No murmur.  Mildly tachycardic. Abdomen: Soft, nontender, nondistended.  Bowel sounds are heard.    PEG tube in place. Extremities: Cold hands, cyanosis of the digits noted. Psych: Alert, awake and communicative.  Mildly anxious. CNS:  No cranial nerve deficits.  Generalized weakness noted of the extremities.  Skin: Warm and dry.  Digital cyanosis  Data Review: I have personally reviewed the following laboratory data and studies,  CBC: Recent Labs  Lab 08/17/19 0303 08/18/19 0240  08/19/19 0650 08/22/19 0316  WBC 6.4 6.7 4.9 3.1*  HGB 13.3 12.4 12.6 11.8*  HCT 45.2 42.7 42.9 38.8  MCV 93.0 93.8 93.1 90.0  PLT 119* 135* 107* 829*   Basic Metabolic Panel: Recent Labs  Lab 08/18/19 0240 08/19/19 0650 08/20/19 0302 08/21/19 0316 08/22/19 0316  NA 133* 131* 133* 126* 124*  K 4.2 4.6 4.2 4.7 5.3*  CL 93* 87* 94* 87* 83*  CO2 27 21* 24 22 23   GLUCOSE 124* 76 116* 100* 106*  BUN 59* 111* 58* 102* 142*  CREATININE 2.06* 3.22* 2.00* 3.01* 3.83*  CALCIUM 9.0 9.2 9.2 9.1 9.1  MG 2.4  --   --   --   --   PHOS 4.7* 6.1* 3.4 4.8* 4.7*   Liver Function Tests: Recent Labs  Lab 08/18/19 0240 08/19/19 0650 08/20/19 0302 08/21/19 0316 08/22/19 0316  AST 50*  --   --   --   --   ALT 22  --   --   --   --  ALKPHOS 104  --   --   --   --   BILITOT 0.4  --   --   --   --   PROT 5.4*  --   --   --   --   ALBUMIN 3.0* 3.0* 3.0* 2.7* 2.8*   No results for input(s): LIPASE, AMYLASE in the last 168 hours. No results for input(s): AMMONIA in the last 168 hours. Cardiac Enzymes: No results for input(s): CKTOTAL, CKMB, CKMBINDEX, TROPONINI in the last 168 hours. BNP (last 3 results) Recent Labs    04/04/19 2000 07/03/19 1930 07/09/19 0347  BNP 76.7 1,124.1* 4,004.9*    ProBNP (last 3 results) No results for input(s): PROBNP in the last 8760 hours.  CBG: Recent Labs  Lab 08/21/19 1619 08/21/19 1943 08/21/19 1947 08/21/19 2346 08/22/19 0346  GLUCAP 148* 68* 125* 119* 115*   Recent Results (from the past 240 hour(s))  MRSA PCR Screening     Status: None   Collection Time: 08/21/19  9:45 AM   Specimen: Nasal Mucosa; Nasopharyngeal  Result Value Ref Range Status   MRSA by PCR NEGATIVE NEGATIVE Final    Comment:        The GeneXpert MRSA Assay (FDA approved for NASAL specimens only), is one component of a comprehensive MRSA colonization surveillance program. It is not intended to diagnose MRSA infection nor to guide or monitor treatment for MRSA  infections. Performed at [facility], [place] [address] [place], [place] [address]      Studies: No results found.  Scheduled Meds: . acetaminophen  500 mg Oral BID  . Chlorhexidine Gluconate Cloth  6 each Topical Q0600  . Chlorhexidine Gluconate Cloth  6 each Topical Q0600  . collagenase   Topical BID  . diclofenac Sodium  2 g Topical BID  . diltiazem  120 mg Oral Daily  . famotidine  10 mg Oral Daily  . feeding supplement (PRO-STAT SUGAR FREE 64)  30 mL Per Tube Daily  . ferric citrate  420 mg Oral TID WC  . gabapentin  100 mg Per Tube Q12H  . Gerhardt's butt cream   Topical TID  . heparin  5,000 Units Subcutaneous Q8H  . loratadine  10 mg Oral Daily  . mouth rinse  15 mL Mouth Rinse BID  . multivitamin  1 tablet Oral QHS  . neomycin-bacitracin-polymyxin   Topical BID  . nystatin  5 mL Mouth/Throat QID  . pantoprazole sodium  40 mg Per Tube BID  . predniSONE  50 mg Oral Q breakfast   Followed by  . [START ON 09/07/2019] predniSONE  40 mg Oral Q breakfast   Followed by  . [START ON 08/31/2019] predniSONE  30 mg Oral Q breakfast  . sertraline  25 mg Oral BID  . sodium chloride irrigation  15 mL Irrigation QID  . thiamine  100 mg Oral Daily    Continuous Infusions: . sodium chloride    . sodium chloride    . sodium chloride    . sodium chloride    . feeding supplement (NEPRO CARB STEADY) 1,000 mL (08/21/19 2124)    Flora Lipps, MD  [facility] 08/22/2019

## 2019-08-22 NOTE — Progress Notes (Addendum)
[facility] Progress Note   Assessment/ Plan:   1. 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  Appreciate assistance from rheumatology phone consultation. 2.  AKI--> ESRD: requiring HD since 07/17/20 without any UOP the past 3 weeks per husband and no evidence of renal recovery, s/p 08/22/19.  Next planned 2/4 3.  Pericardial effusion: s/p pericardial window 12/23 4.  Hepatic steatosis: followed at [facility] 5.  Malnutrition: on TF, got PEG 6.  CKD-MB: Auryxia as binder  7.  Anemia: ESA and iron as appropriate 8.  Afib: poor candidate for [facility], cardiology following 9.  Dispo: pending   Subjective:    S/p HD today, good goal of 2.1L off.  Sleeping now.  Husband at bedside.     Objective:   BP 132/84 (BP Location: Left Arm)   Pulse 83   Temp 97.6 F (36.4 C) (Axillary)   Resp 13   Ht 5' 6"  (1.676 m)   Wt 39.9 kg   SpO2 100%   BMI 14.20 kg/m   Physical Exam: Gen: older woman, sleeping in bed, frail CVS: RRR no rub Resp: bibasilar crackles, improved Abd: soft, slightly distended NABS Ext: edema improved.  Labs: BMET Recent Labs  Lab 08/16/19 0257 08/17/19 0303 08/18/19 0240 08/19/19 0650 08/20/19 0302 08/21/19 0316 08/22/19 0316  NA 134* 132* 133* 131* 133* 126* 124*  K 4.3 4.5 4.2 4.6 4.2 4.7 5.3*  CL 94* 92* 93* 87* 94* 87* 83*  CO2 26 22 27  21* 24 22 23   GLUCOSE 98 97 124* 76 116* 100* 106*  BUN 56* 98* 59* 111* 58* 102* 142*  CREATININE 2.16* 3.23* 2.06* 3.22* 2.00* 3.01* 3.83*  CALCIUM 9.1 9.5 9.0 9.2 9.2 9.1 9.1  PHOS 4.7* 6.8* 4.7* 6.1* 3.4 4.8* 4.7*   CBC Recent Labs  Lab 08/17/19 0303 08/18/19 0240 08/19/19 0650 08/22/19 0316  WBC 6.4 6.7 4.9 3.1*  HGB 13.3 12.4 12.6 11.8*  HCT 45.2 42.7 42.9 38.8  MCV 93.0 93.8 93.1 90.0  PLT 119* 135* 107* 127*      Medications:    . acetaminophen  500 mg Oral BID  . Chlorhexidine Gluconate Cloth  6 each Topical Q0600  . Chlorhexidine Gluconate Cloth  6 each  Topical Q0600  . collagenase   Topical BID  . diclofenac Sodium  2 g Topical BID  . diltiazem  120 mg Oral Daily  . famotidine  10 mg Oral Daily  . feeding supplement (PRO-STAT SUGAR FREE 64)  30 mL Per Tube Daily  . ferric citrate  420 mg Oral TID WC  . gabapentin  100 mg Per Tube Q12H  . Gerhardt's butt cream   Topical TID  . heparin  5,000 Units Subcutaneous Q8H  . loratadine  10 mg Oral Daily  . mouth rinse  15 mL Mouth Rinse BID  . multivitamin  1 tablet Oral QHS  . neomycin-bacitracin-polymyxin   Topical BID  . nystatin  5 mL Mouth/Throat QID  . pantoprazole sodium  40 mg Per Tube BID  . predniSONE  50 mg Oral Q breakfast   Followed by  . [START ON 08/31/2019] predniSONE  40 mg Oral Q breakfast   Followed by  . [START ON 09/07/2019] predniSONE  30 mg Oral Q breakfast  . sertraline  25 mg Oral BID  . sodium chloride irrigation  15 mL Irrigation QID  . thiamine  100 mg Oral Daily     Madelon Lips, MD 08/22/2019, 2:48  PM

## 2019-08-22 NOTE — Plan of Care (Signed)

## 2019-08-22 NOTE — Progress Notes (Signed)
Renal Navigator requested update from [place] [facility] where referral was sent yesterday. Per staff, Medical Director is still reviewing.  Renal Navigator will follow closely.  Alphonzo Cruise,  Renal Navigator [phone]

## 2019-08-23 ENCOUNTER — Encounter (HOSPITAL_COMMUNITY): Payer: Self-pay | Admitting: Physical Medicine and Rehabilitation

## 2019-08-23 ENCOUNTER — Inpatient Hospital Stay (HOSPITAL_COMMUNITY)
Admission: RE | Admit: 2019-08-23 | Discharge: 2019-08-29 | DRG: 945 | Disposition: A | Discharge status: Other Acute Inpatient Hospital | Payer: Medicare Other | Source: Intra-hospital | Attending: Physical Medicine and Rehabilitation | Admitting: Physical Medicine and Rehabilitation

## 2019-08-23 DIAGNOSIS — Z8249 Family history of ischemic heart disease and other diseases of the circulatory system: Secondary | ICD-10-CM

## 2019-08-23 DIAGNOSIS — I4891 Unspecified atrial fibrillation: Secondary | ICD-10-CM | POA: Diagnosis present

## 2019-08-23 DIAGNOSIS — R627 Adult failure to thrive: Secondary | ICD-10-CM | POA: Diagnosis present

## 2019-08-23 DIAGNOSIS — I120 Hypertensive chronic kidney disease with stage 5 chronic kidney disease or end stage renal disease: Secondary | ICD-10-CM | POA: Diagnosis present

## 2019-08-23 DIAGNOSIS — L89810 Pressure ulcer of head, unstageable: Secondary | ICD-10-CM | POA: Diagnosis present

## 2019-08-23 DIAGNOSIS — L89153 Pressure ulcer of sacral region, stage 3: Secondary | ICD-10-CM | POA: Diagnosis present

## 2019-08-23 DIAGNOSIS — I351 Nonrheumatic aortic (valve) insufficiency: Secondary | ICD-10-CM | POA: Diagnosis present

## 2019-08-23 DIAGNOSIS — N186 End stage renal disease: Secondary | ICD-10-CM | POA: Diagnosis present

## 2019-08-23 DIAGNOSIS — Z931 Gastrostomy status: Secondary | ICD-10-CM | POA: Diagnosis not present

## 2019-08-23 DIAGNOSIS — R34 Anuria and oliguria: Secondary | ICD-10-CM | POA: Diagnosis present

## 2019-08-23 DIAGNOSIS — D696 Thrombocytopenia, unspecified: Secondary | ICD-10-CM

## 2019-08-23 DIAGNOSIS — D638 Anemia in other chronic diseases classified elsewhere: Secondary | ICD-10-CM

## 2019-08-23 DIAGNOSIS — G9341 Metabolic encephalopathy: Secondary | ICD-10-CM | POA: Diagnosis not present

## 2019-08-23 DIAGNOSIS — M341 CR(E)ST syndrome: Secondary | ICD-10-CM | POA: Diagnosis not present

## 2019-08-23 DIAGNOSIS — I313 Pericardial effusion (noninflammatory): Secondary | ICD-10-CM | POA: Diagnosis not present

## 2019-08-23 DIAGNOSIS — Z681 Body mass index (BMI) 19 or less, adult: Secondary | ICD-10-CM | POA: Diagnosis not present

## 2019-08-23 DIAGNOSIS — G629 Polyneuropathy, unspecified: Secondary | ICD-10-CM | POA: Diagnosis present

## 2019-08-23 DIAGNOSIS — R1310 Dysphagia, unspecified: Secondary | ICD-10-CM | POA: Diagnosis present

## 2019-08-23 DIAGNOSIS — R5381 Other malaise: Principal | ICD-10-CM | POA: Diagnosis present

## 2019-08-23 DIAGNOSIS — F410 Panic disorder [episodic paroxysmal anxiety] without agoraphobia: Secondary | ICD-10-CM | POA: Diagnosis present

## 2019-08-23 DIAGNOSIS — Z992 Dependence on renal dialysis: Secondary | ICD-10-CM | POA: Diagnosis not present

## 2019-08-23 DIAGNOSIS — Z87891 Personal history of nicotine dependence: Secondary | ICD-10-CM | POA: Diagnosis not present

## 2019-08-23 DIAGNOSIS — E43 Unspecified severe protein-calorie malnutrition: Secondary | ICD-10-CM | POA: Diagnosis not present

## 2019-08-23 DIAGNOSIS — D72819 Decreased white blood cell count, unspecified: Secondary | ICD-10-CM | POA: Diagnosis present

## 2019-08-23 DIAGNOSIS — B373 Candidiasis of vulva and vagina: Secondary | ICD-10-CM | POA: Diagnosis present

## 2019-08-23 DIAGNOSIS — R64 Cachexia: Secondary | ICD-10-CM | POA: Diagnosis present

## 2019-08-23 DIAGNOSIS — R109 Unspecified abdominal pain: Secondary | ICD-10-CM

## 2019-08-23 DIAGNOSIS — M340 Progressive systemic sclerosis: Secondary | ICD-10-CM | POA: Diagnosis not present

## 2019-08-23 DIAGNOSIS — R4182 Altered mental status, unspecified: Secondary | ICD-10-CM

## 2019-08-23 DIAGNOSIS — D631 Anemia in chronic kidney disease: Secondary | ICD-10-CM | POA: Diagnosis present

## 2019-08-23 DIAGNOSIS — L299 Pruritus, unspecified: Secondary | ICD-10-CM | POA: Diagnosis present

## 2019-08-23 DIAGNOSIS — Z9889 Other specified postprocedural states: Secondary | ICD-10-CM

## 2019-08-23 DIAGNOSIS — Z79899 Other long term (current) drug therapy: Secondary | ICD-10-CM

## 2019-08-23 DIAGNOSIS — K649 Unspecified hemorrhoids: Secondary | ICD-10-CM | POA: Diagnosis present

## 2019-08-23 DIAGNOSIS — I10 Essential (primary) hypertension: Secondary | ICD-10-CM | POA: Diagnosis present

## 2019-08-23 DIAGNOSIS — K760 Fatty (change of) liver, not elsewhere classified: Secondary | ICD-10-CM | POA: Diagnosis present

## 2019-08-23 DIAGNOSIS — R110 Nausea: Secondary | ICD-10-CM | POA: Diagnosis present

## 2019-08-23 DIAGNOSIS — I3139 Other pericardial effusion (noninflammatory): Secondary | ICD-10-CM | POA: Diagnosis present

## 2019-08-23 DIAGNOSIS — I480 Paroxysmal atrial fibrillation: Secondary | ICD-10-CM | POA: Diagnosis present

## 2019-08-23 DIAGNOSIS — E871 Hypo-osmolality and hyponatremia: Secondary | ICD-10-CM | POA: Diagnosis present

## 2019-08-23 DIAGNOSIS — R600 Localized edema: Secondary | ICD-10-CM | POA: Diagnosis present

## 2019-08-23 DIAGNOSIS — E860 Dehydration: Secondary | ICD-10-CM | POA: Diagnosis present

## 2019-08-23 DIAGNOSIS — R195 Other fecal abnormalities: Secondary | ICD-10-CM | POA: Diagnosis not present

## 2019-08-23 HISTORY — DX: Unspecified atrial fibrillation: I48.91

## 2019-08-23 HISTORY — DX: End stage renal disease: N18.6

## 2019-08-23 HISTORY — DX: Systemic sclerosis, unspecified: M34.9

## 2019-08-23 LAB — CBC
HCT: 44.2 % (ref 36.0–46.0)
Hemoglobin: 12.7 g/dL (ref 12.0–15.0)
MCH: 27.4 pg (ref 26.0–34.0)
MCHC: 28.7 g/dL — ABNORMAL LOW (ref 30.0–36.0)
MCV: 95.5 fL (ref 80.0–100.0)
Platelets: 128 10*3/uL — ABNORMAL LOW (ref 150–400)
RBC: 4.63 MIL/uL (ref 3.87–5.11)
RDW: 20.7 % — ABNORMAL HIGH (ref 11.5–15.5)
WBC: 3.3 10*3/uL — ABNORMAL LOW (ref 4.0–10.5)
nRBC: 0 % (ref 0.0–0.2)

## 2019-08-23 LAB — GLUCOSE, CAPILLARY
Glucose-Capillary: 125 mg/dL — ABNORMAL HIGH (ref 70–99)
Glucose-Capillary: 128 mg/dL — ABNORMAL HIGH (ref 70–99)
Glucose-Capillary: 125 mg/dL — ABNORMAL HIGH (ref 70–99)

## 2019-08-23 LAB — RENAL FUNCTION PANEL
Albumin: 2.8 g/dL — ABNORMAL LOW (ref 3.5–5.0)
Anion gap: 16 — ABNORMAL HIGH (ref 5–15)
BUN: 72 mg/dL — ABNORMAL HIGH (ref 8–23)
CO2: 24 mmol/L (ref 22–32)
Calcium: 8.8 mg/dL — ABNORMAL LOW (ref 8.9–10.3)
Chloride: 93 mmol/L — ABNORMAL LOW (ref 98–111)
Creatinine, Ser: 2.33 mg/dL — ABNORMAL HIGH (ref 0.44–1.00)
GFR calc Af Amer: 24 mL/min — ABNORMAL LOW (ref >60)
GFR calc non Af Amer: 21 mL/min — ABNORMAL LOW (ref >60)
Glucose, Bld: 122 mg/dL — ABNORMAL HIGH (ref 70–99)
Phosphorus: 2.8 mg/dL (ref 2.5–4.6)
Potassium: 4.7 mmol/L (ref 3.5–5.1)
Sodium: 133 mmol/L — ABNORMAL LOW (ref 135–145)

## 2019-08-23 MED ORDER — NEPRO/CARBSTEADY PO LIQD: 1000.0000 mL | ORAL | 0 refills | Status: AC

## 2019-08-23 MED ORDER — VITAMIN C 500 MG/5ML PO SYRP
500.0000 mg | ORAL_SOLUTION | Freq: Two times a day (BID) | ORAL | Status: DC
  Administered 2019-08-24 – 2019-08-29 (×12): 500 mg
  Filled 2019-08-23 (×13): qty 5

## 2019-08-23 MED ORDER — POLYVINYL ALCOHOL 1.4 % OP SOLN
1.0000 [drp] | Freq: Two times a day (BID) | OPHTHALMIC | Status: DC | PRN
  Filled 2019-08-23: qty 15

## 2019-08-23 MED ORDER — BISACODYL 10 MG RE SUPP: 10.0000 mg | Freq: Every day | RECTAL | Status: AC | PRN

## 2019-08-23 MED ORDER — SERTRALINE HCL 50 MG PO TABS
25.0000 mg | ORAL_TABLET | Freq: Two times a day (BID) | ORAL | Status: DC
  Administered 2019-08-24 – 2019-08-29 (×11): 25 mg via ORAL
  Filled 2019-08-23 (×11): qty 1

## 2019-08-23 MED ORDER — GUAIFENESIN-DM 100-10 MG/5ML PO SYRP: 5.0000 mL | ORAL_SOLUTION | Freq: Four times a day (QID) | ORAL | Status: DC | PRN

## 2019-08-23 MED ORDER — ACETAMINOPHEN 160 MG/5ML PO SOLN: 500.0000 mg | Freq: Four times a day (QID) | ORAL | 0 refills | Status: AC | PRN

## 2019-08-23 MED ORDER — OXYCODONE HCL 5 MG PO TABS: 5.0000 mg | ORAL_TABLET | Freq: Three times a day (TID) | ORAL | 0 refills | Status: AC | PRN

## 2019-08-23 MED ORDER — GERHARDT'S BUTT CREAM
TOPICAL_CREAM | Freq: Three times a day (TID) | CUTANEOUS | Status: DC
  Filled 2019-08-23: qty 1

## 2019-08-23 MED ORDER — CHLORHEXIDINE GLUCONATE CLOTH 2 % EX PADS
6.0000 | MEDICATED_PAD | Freq: Every day | CUTANEOUS | Status: DC
  Administered 2019-08-24: 6 via TOPICAL

## 2019-08-23 MED ORDER — GABAPENTIN 250 MG/5ML PO SOLN: 100.0000 mg | Freq: Two times a day (BID) | ORAL | 12 refills | Status: AC

## 2019-08-23 MED ORDER — ZINC SULFATE 220 (50 ZN) MG PO CAPS
220.0000 mg | ORAL_CAPSULE | Freq: Every day | ORAL | Status: DC
  Administered 2019-08-24 – 2019-08-29 (×6): 220 mg via ORAL
  Filled 2019-08-23 (×6): qty 1

## 2019-08-23 MED ORDER — PANTOPRAZOLE SODIUM 40 MG PO PACK: 40.0000 mg | PACK | Freq: Two times a day (BID) | ORAL | Status: AC

## 2019-08-23 MED ORDER — HEPARIN SODIUM (PORCINE) 1000 UNIT/ML DIALYSIS: 1000.0000 [IU] | INTRAMUSCULAR | Status: DC | PRN

## 2019-08-23 MED ORDER — DILTIAZEM HCL ER COATED BEADS 120 MG PO CP24: 120.0000 mg | ORAL_CAPSULE | Freq: Every day | ORAL | Status: AC

## 2019-08-23 MED ORDER — ALUMINUM HYDROXIDE GEL 320 MG/5ML PO SUSP
10.0000 mL | ORAL | Status: DC | PRN
  Filled 2019-08-23: qty 30

## 2019-08-23 MED ORDER — ONDANSETRON HCL 4 MG PO TABS
4.0000 mg | ORAL_TABLET | Freq: Four times a day (QID) | ORAL | Status: DC | PRN
  Administered 2019-08-25 – 2019-08-28 (×6): 4 mg via ORAL
  Filled 2019-08-23 (×5): qty 1

## 2019-08-23 MED ORDER — WITCH HAZEL-GLYCERIN EX PADS
MEDICATED_PAD | CUTANEOUS | Status: DC | PRN
  Filled 2019-08-23: qty 100

## 2019-08-23 MED ORDER — FERRIC CITRATE 1 GM 210 MG(FE) PO TABS: 420.0000 mg | ORAL_TABLET | Freq: Three times a day (TID) | ORAL | Status: AC

## 2019-08-23 MED ORDER — BISACODYL 10 MG RE SUPP: 10.0000 mg | Freq: Every day | RECTAL | Status: DC | PRN

## 2019-08-23 MED ORDER — GERHARDT'S BUTT CREAM: 1.0000 "application " | TOPICAL_CREAM | Freq: Three times a day (TID) | CUTANEOUS | Status: AC

## 2019-08-23 MED ORDER — BIOTENE DRY MOUTH MT LIQD: 15.0000 mL | OROMUCOSAL | Status: DC | PRN

## 2019-08-23 MED ORDER — FERRIC CITRATE 1 GM 210 MG(FE) PO TABS
420.0000 mg | ORAL_TABLET | Freq: Three times a day (TID) | ORAL | Status: DC
  Administered 2019-08-24 – 2019-08-28 (×14): 420 mg via ORAL
  Filled 2019-08-23 (×18): qty 2

## 2019-08-23 MED ORDER — HEPARIN SODIUM (PORCINE) 5000 UNIT/ML IJ SOLN
5000.0000 [IU] | Freq: Three times a day (TID) | INTRAMUSCULAR | Status: DC
  Administered 2019-08-24 – 2019-08-29 (×15): 5000 [IU] via SUBCUTANEOUS
  Filled 2019-08-23 (×15): qty 1

## 2019-08-23 MED ORDER — NYSTATIN 100000 UNIT/ML MT SUSP
5.0000 mL | Freq: Four times a day (QID) | OROMUCOSAL | Status: DC
  Administered 2019-08-24 – 2019-08-28 (×14): 500000 [IU] via OROMUCOSAL
  Filled 2019-08-23 (×15): qty 5

## 2019-08-23 MED ORDER — FAMOTIDINE 20 MG PO TABS
10.0000 mg | ORAL_TABLET | Freq: Every day | ORAL | Status: DC
  Administered 2019-08-24 – 2019-08-29 (×6): 10 mg via ORAL
  Filled 2019-08-23 (×6): qty 1

## 2019-08-23 MED ORDER — THIAMINE HCL 100 MG PO TABS: 100.0000 mg | ORAL_TABLET | Freq: Every day | ORAL | Status: AC

## 2019-08-23 MED ORDER — GABAPENTIN 250 MG/5ML PO SOLN
100.0000 mg | Freq: Two times a day (BID) | ORAL | Status: DC
  Administered 2019-08-24 – 2019-08-29 (×11): 100 mg
  Filled 2019-08-23 (×14): qty 2

## 2019-08-23 MED ORDER — ONDANSETRON HCL 4 MG/2ML IJ SOLN: 4.0000 mg | Freq: Four times a day (QID) | INTRAMUSCULAR | Status: DC | PRN

## 2019-08-23 MED ORDER — PRO-STAT SUGAR FREE PO LIQD
30.0000 mL | Freq: Every day | ORAL | Status: DC
  Administered 2019-08-24 – 2019-08-29 (×6): 30 mL
  Filled 2019-08-23 (×6): qty 30

## 2019-08-23 MED ORDER — POLYETHYLENE GLYCOL 3350 17 G PO PACK
17.0000 g | PACK | Freq: Every day | ORAL | Status: DC
  Administered 2019-08-24 – 2019-08-25 (×2): 17 g via ORAL
  Filled 2019-08-23 (×2): qty 1

## 2019-08-23 MED ORDER — THIAMINE HCL 100 MG PO TABS
100.0000 mg | ORAL_TABLET | Freq: Every day | ORAL | Status: DC
  Administered 2019-08-24 – 2019-08-29 (×6): 100 mg via ORAL
  Filled 2019-08-23 (×5): qty 1

## 2019-08-23 MED ORDER — ACETAMINOPHEN 500 MG PO TABS
500.0000 mg | ORAL_TABLET | Freq: Two times a day (BID) | ORAL | Status: DC
  Administered 2019-08-24 – 2019-08-29 (×10): 500 mg via ORAL
  Filled 2019-08-23 (×10): qty 1

## 2019-08-23 MED ORDER — ALPRAZOLAM 0.25 MG PO TABS
0.2500 mg | ORAL_TABLET | Freq: Three times a day (TID) | ORAL | Status: DC | PRN
  Administered 2019-08-24 – 2019-08-27 (×6): 0.25 mg via ORAL
  Filled 2019-08-23 (×8): qty 1

## 2019-08-23 MED ORDER — POLYETHYLENE GLYCOL 3350 17 G PO PACK: 17.0000 g | PACK | Freq: Every day | ORAL | Status: DC | PRN

## 2019-08-23 MED ORDER — CAMPHOR-MENTHOL 0.5-0.5 % EX LOTN: TOPICAL_LOTION | CUTANEOUS | 0 refills | Status: AC | PRN

## 2019-08-23 MED ORDER — RENA-VITE PO TABS
1.0000 | ORAL_TABLET | Freq: Every day | ORAL | Status: DC
  Administered 2019-08-24 – 2019-08-28 (×6): 1 via ORAL
  Filled 2019-08-23 (×5): qty 1

## 2019-08-23 MED ORDER — DILTIAZEM HCL ER COATED BEADS 120 MG PO CP24
120.0000 mg | ORAL_CAPSULE | Freq: Every day | ORAL | Status: DC
  Administered 2019-08-24 – 2019-08-28 (×5): 120 mg via ORAL
  Filled 2019-08-23 (×5): qty 1

## 2019-08-23 MED ORDER — SENNOSIDES-DOCUSATE SODIUM 8.6-50 MG PO TABS: 1.0000 | ORAL_TABLET | Freq: Two times a day (BID) | ORAL | Status: AC

## 2019-08-23 MED ORDER — SODIUM CHLORIDE 0.9 % IV SOLN: 100.0000 mL | INTRAVENOUS | Status: DC | PRN

## 2019-08-23 MED ORDER — DICLOFENAC SODIUM 1 % EX GEL
2.0000 g | Freq: Two times a day (BID) | CUTANEOUS | Status: DC
  Administered 2019-08-24 – 2019-08-28 (×10): 2 g via TOPICAL
  Filled 2019-08-23 (×2): qty 100

## 2019-08-23 MED ORDER — HEPARIN SODIUM (PORCINE) 5000 UNIT/ML IJ SOLN: 5000.0000 [IU] | Freq: Three times a day (TID) | INTRAMUSCULAR | Status: DC

## 2019-08-23 MED ORDER — PREDNISONE 20 MG PO TABS: 30.0000 mg | ORAL_TABLET | Freq: Every day | ORAL | Status: DC

## 2019-08-23 MED ORDER — DICLOFENAC SODIUM 1 % EX GEL: 2.0000 g | Freq: Two times a day (BID) | CUTANEOUS | Status: AC

## 2019-08-23 MED ORDER — CHLORHEXIDINE GLUCONATE CLOTH 2 % EX PADS
6.0000 | MEDICATED_PAD | Freq: Every day | CUTANEOUS | Status: DC
  Administered 2019-08-24 – 2019-08-25 (×2): 6 via TOPICAL

## 2019-08-23 MED ORDER — LIDOCAINE-PRILOCAINE 2.5-2.5 % EX CREA: 1.0000 "application " | TOPICAL_CREAM | CUTANEOUS | Status: DC | PRN

## 2019-08-23 MED ORDER — POLYETHYLENE GLYCOL 3350 17 G PO PACK: 17.0000 g | PACK | Freq: Every day | ORAL | 0 refills | Status: AC

## 2019-08-23 MED ORDER — PREDNISONE 20 MG PO TABS
50.0000 mg | ORAL_TABLET | Freq: Every day | ORAL | Status: AC
  Administered 2019-08-24 – 2019-08-28 (×5): 50 mg via ORAL
  Filled 2019-08-23 (×5): qty 2

## 2019-08-23 MED ORDER — SENNOSIDES-DOCUSATE SODIUM 8.6-50 MG PO TABS
1.0000 | ORAL_TABLET | Freq: Two times a day (BID) | ORAL | Status: DC
  Administered 2019-08-24 – 2019-08-25 (×2): 1 via ORAL
  Filled 2019-08-23 (×3): qty 1

## 2019-08-23 MED ORDER — PRO-STAT SUGAR FREE PO LIQD: 30.0000 mL | Freq: Every day | ORAL | 0 refills | Status: AC

## 2019-08-23 MED ORDER — PREDNISONE 20 MG PO TABS: 40.0000 mg | ORAL_TABLET | Freq: Every day | ORAL | Status: DC

## 2019-08-23 MED ORDER — BIOTENE DRY MOUTH MT LIQD: 15.0000 mL | OROMUCOSAL | Status: AC | PRN

## 2019-08-23 MED ORDER — BACITRACIN-NEOMYCIN-POLYMYXIN OINTMENT TUBE
TOPICAL_OINTMENT | Freq: Two times a day (BID) | CUTANEOUS | Status: DC
  Filled 2019-08-23: qty 14

## 2019-08-23 MED ORDER — DIPHENHYDRAMINE HCL 12.5 MG/5ML PO ELIX: 12.5000 mg | ORAL_SOLUTION | Freq: Four times a day (QID) | ORAL | Status: DC | PRN

## 2019-08-23 MED ORDER — LIDOCAINE HCL (PF) 1 % IJ SOLN: 5.0000 mL | INTRAMUSCULAR | Status: DC | PRN

## 2019-08-23 MED ORDER — CAMPHOR-MENTHOL 0.5-0.5 % EX LOTN
TOPICAL_LOTION | CUTANEOUS | Status: DC | PRN
  Filled 2019-08-23: qty 222

## 2019-08-23 MED ORDER — PREDNISONE 10 MG PO TABS: ORAL_TABLET | ORAL | 0 refills | Status: AC

## 2019-08-23 MED ORDER — LORATADINE 10 MG PO TABS: 10.0000 mg | ORAL_TABLET | Freq: Every day | ORAL | Status: AC

## 2019-08-23 MED ORDER — PENTAFLUOROPROP-TETRAFLUOROETH EX AERO: 1.0000 "application " | INHALATION_SPRAY | CUTANEOUS | Status: DC | PRN

## 2019-08-23 MED ORDER — COLLAGENASE 250 UNIT/GM EX OINT: TOPICAL_OINTMENT | Freq: Two times a day (BID) | CUTANEOUS | 0 refills | Status: AC

## 2019-08-23 MED ORDER — HYDROXYZINE HCL 25 MG PO TABS: 12.5000 mg | ORAL_TABLET | Freq: Two times a day (BID) | ORAL | 0 refills | Status: AC | PRN

## 2019-08-23 MED ORDER — OXYCODONE HCL 5 MG PO TABS
5.0000 mg | ORAL_TABLET | Freq: Three times a day (TID) | ORAL | Status: DC | PRN
  Administered 2019-08-24 – 2019-08-29 (×10): 5 mg via ORAL
  Filled 2019-08-23 (×11): qty 1

## 2019-08-23 MED ORDER — MILK AND MOLASSES ENEMA
1.0000 | Freq: Every day | RECTAL | Status: DC | PRN
  Filled 2019-08-23: qty 240

## 2019-08-23 MED ORDER — ORAL CARE MOUTH RINSE
15.0000 mL | Freq: Two times a day (BID) | OROMUCOSAL | Status: DC
  Administered 2019-08-24 – 2019-08-28 (×10): 15 mL via OROMUCOSAL

## 2019-08-23 MED ORDER — LORATADINE 10 MG PO TABS
10.0000 mg | ORAL_TABLET | Freq: Every day | ORAL | Status: DC
  Administered 2019-08-24 – 2019-08-29 (×6): 10 mg via ORAL
  Filled 2019-08-23 (×6): qty 1

## 2019-08-23 MED ORDER — BISACODYL 5 MG PO TBEC: 10.0000 mg | DELAYED_RELEASE_TABLET | Freq: Every day | ORAL | Status: AC | PRN

## 2019-08-23 MED ORDER — NYSTATIN 100000 UNIT/ML MT SUSP: 5.0000 mL | Freq: Four times a day (QID) | OROMUCOSAL | 0 refills | Status: AC

## 2019-08-23 MED ORDER — PANTOPRAZOLE SODIUM 40 MG PO PACK
40.0000 mg | PACK | Freq: Two times a day (BID) | ORAL | Status: DC
  Administered 2019-08-24 – 2019-08-29 (×11): 40 mg
  Filled 2019-08-23 (×11): qty 20

## 2019-08-23 MED ORDER — HYDROXYZINE HCL 25 MG PO TABS: 12.5000 mg | ORAL_TABLET | Freq: Two times a day (BID) | ORAL | Status: DC | PRN

## 2019-08-23 MED ORDER — COLLAGENASE 250 UNIT/GM EX OINT
TOPICAL_OINTMENT | Freq: Every day | CUTANEOUS | Status: DC
  Filled 2019-08-23 (×2): qty 30

## 2019-08-23 MED ORDER — ALPRAZOLAM 0.25 MG PO TABS: 0.2500 mg | ORAL_TABLET | Freq: Three times a day (TID) | ORAL | 0 refills | Status: AC | PRN

## 2019-08-23 MED ORDER — FAMOTIDINE 10 MG PO TABS: 10.0000 mg | ORAL_TABLET | Freq: Every day | ORAL | Status: AC

## 2019-08-23 MED ORDER — NEPRO/CARBSTEADY PO LIQD
1000.0000 mL | ORAL | Status: DC
  Administered 2019-08-24: 1000 mL
  Filled 2019-08-23 (×2): qty 1000

## 2019-08-23 MED ORDER — RENA-VITE PO TABS: 1.0000 | ORAL_TABLET | Freq: Every day | ORAL | 0 refills | Status: AC

## 2019-08-23 MED ORDER — ACETAMINOPHEN 325 MG PO TABS
325.0000 mg | ORAL_TABLET | ORAL | Status: DC | PRN
  Administered 2019-08-24: 650 mg via ORAL
  Administered 2019-08-26: 325 mg via ORAL
  Administered 2019-08-27 – 2019-08-28 (×2): 650 mg via ORAL
  Filled 2019-08-23 (×4): qty 2

## 2019-08-23 NOTE — H&P (Signed)
Physical Medicine and Rehabilitation Admission H&P    Chief Complaint  Patient presents with  . Debility    HPI: Joanna Ali is a 69 year old female with history of hepatic steatosis, HTN, peripheral neuropathy, macular degeneration, progressive DOE with fluid overload since 02/2019 and history of pericardial/pleural effusion who was admitted on 07/03/2019 with AKI, anasarca and fatigue.  History taken from chart review and husband.  She has had negative work up by rheumatology and at [facility].  She was started on IV diuresis and cardiac echo showed moderately large pericardial effusion. Cardiac cath and cardiac MRI without evidence of pericardial constriction- negative for amyloidosis.  Dr Tera Mater consulted for assistance and she underwent R-VATS with pericardial window on 07/12/2019. She continued to require diuresis and had worsening of renal function with intermittent hyperkalemia and uremia with lethary requiring initiation of HD 07/20/2019. She has had poor po intake, difficulty swallowing, persistent tail bone pain with FTT. She had PEG placed by Dr. Anselm Pancoast on 07/28/2019.  Palliative care consulted for [place] and questioned a trial of steroids empirically due to concerns of carcinoid or multisystem inflammatory syndrome. She has had bouts of agitation and nephrology expressed concerns of steroids making anxiety worse. Question of transfer to [facility] (no beds). Rheumatology/ Dr. Kathlene November consulted for input on autoimmune process and she was started on high dose steroids due to consideration of scleroderma with CREST syndrome.     Anti-RNA polymerase III antibody positive. Repeat SPEP negative for monoclonal protein. Dr. Jana Hakim consulted for input to rule out occult malignancy as she developed leucopenia. He  questioned possibility of malignancy with scleroderma as secondary.  CT abdomen/pelvis/chest negative and pleural fluid negative for malignancy--he felt that further screening for malignancy can be  done on outpatient basis.  She underwent repeat thoracocentesis on 08/01/2019 positive for mesothelial cells--evaluate on outpatient basis once stronger.   PAF with RVR on 08/13/2019 treated with metoprolol for rate control and Dr. Margaretann Loveless  --not a candidate for coumadin or amiodarone per cardiology. Dr. Scot Dock consulted due to ischemic fingers --patient with positive pulses with negative BLE dopplers and changes felt to be due to scleroderma/small vessel disease. Metoprolol changed to Cardizem due to worsening of Raynaud's phenomenon. She was started on slow steroid taper on 1/26--to decrease by 10 mg per week to 30 mg daily with follow up with Rheum. Patient with AKI with anuria requiring HD TTS.  Please see preadmission assessment from earlier today.  Review of Systems  Constitutional: Positive for malaise/fatigue. Negative for chills and fever.  Musculoskeletal: Positive for back pain, joint pain and myalgias.  Neurological: Positive for weakness. Negative for sensory change and speech change.  All other systems reviewed and are negative.  Past Medical History:  Diagnosis Date  . Hypertension   . Leg swelling 03/2019  . Short of breath on exertion 03/2019    Past Surgical History:  Procedure Laterality Date  . APPENDECTOMY    . IR FLUORO GUIDE CV LINE LEFT  07/18/2019  . IR GASTROSTOMY TUBE MOD SED  07/28/2019  . IR US GUIDE VASC ACCESS LEFT  07/18/2019  . NECK SURGERY    . RIGHT AND LEFT HEART CATH N/A 07/06/2019   Procedure: RIGHT AND LEFT HEART CATH;  Surgeon: Jolaine Artist, MD;  Location: [facility];  Service: Cardiovascular;  Laterality: N/A;  . TEE WITHOUT CARDIOVERSION N/A 03/28/2019   Procedure: TRANSESOPHAGEAL ECHOCARDIOGRAM (TEE);  Surgeon: Geralynn Rile, MD;  Location: [facility];  Service: Cardiology;  Laterality: N/A;  . VIDEO ASSISTED THORACOSCOPY Right 07/12/2019   Procedure: VIDEO ASSISTED THORACOSCOPY Pericardial window ;  Surgeon: Lajuana Matte, MD;  Location: [facility];  Service: Thoracic;  Laterality: Right;    Family History  Problem Relation Age of Onset  . Hypertension Mother     Social History:  Married. Retired Optometrist. She reports that she quit smoking about 40 years ago. Her smoking use included cigarettes. [name]. She has never used smokeless tobacco. [name]--used to drink 3-4 glasses of wine daily. .    Allergies: No Known Allergies    Medications Prior to Admission  Medication Sig Dispense Refill  . gabapentin (NEURONTIN) 300 MG capsule Take 300 mg by mouth 3 (three) times daily.     . Multiple Vitamins-Minerals (MULTIVITAMIN WITH MINERALS) tablet Take 1 tablet by mouth daily.    . Multiple Vitamins-Minerals (PRESERVISION AREDS 2) CAPS Take 1 capsule by mouth 2 (two) times daily.     Marland Kitchen Propylene Glycol (SYSTANE BALANCE) 0.6 % SOLN Place 1 drop into both eyes 2 (two) times daily as needed (dry eyes).    Marland Kitchen senna (SENOKOT) 8.6 MG TABS tablet Take 1-3 tablets by mouth See admin instructions. Taking 1 tablet in the am and 2 tabs at bedtime    . sertraline (ZOLOFT) 25 MG tablet Take 25 mg by mouth 2 (two) times daily.     Marland Kitchen spironolactone (ALDACTONE) 100 MG tablet Take 100 mg by mouth daily.     Marland Kitchen torsemide (DEMADEX) 20 MG tablet Take 20 mg by mouth daily.     . traMADol (ULTRAM) 50 MG tablet Take 50 mg by mouth every 6 (six) hours as needed for moderate pain.     . TURMERIC PO Take 1 capsule by mouth daily.       Drug Regimen Review  Drug regimen was reviewed and remains appropriate with no significant issues identified  Home: Home Living Family/patient expects to be discharged to:: Private residence Living Arrangements: Spouse/significant other Available Help at Discharge: Family, Available 24 hours/day Type of Home: House Home Access: Stairs to enter [employer] of Steps: 3 Entrance Stairs-Rails: None Home Layout:  Two level, Able to live on main level with bedroom/bathroom Bathroom Shower/Tub: Multimedia programmer: Standard Home Equipment: Cane - single point, Other (comment), Toilet riser, Shower seat, Hand held shower head, Bedside commode Additional Comments: cushion for toilet   Functional History: Prior Function Level of Independence: Independent with assistive device(s) Comments: Uses SPC for ambulation. Sits to shower. Uses BSC for night time. Gets help with shoes/socks. Spouse does IADLs.  Functional Status:  Mobility: Bed Mobility Overal bed mobility: Needs Assistance Bed Mobility: Rolling, Sidelying to Sit Rolling: Min assist Sidelying to sit: Mod assist Supine to sit: Mod assist, HOB elevated Sit to supine: Total assist, HOB elevated General bed mobility comments: pt OOB in recliner upon PT arrival Transfers Overall transfer level: Needs assistance Equipment used: Rolling walker (2 wheeled), 2 person hand held assist Transfers: Sit to/from Stand Sit to Stand: Min assist Stand pivot transfers: Mod assist General transfer comment: minA progressing to modA of 1 with pt fatigue, completed x5 through session. pt initially prefered HHA of 2, then requesting RW Ambulation/Gait Ambulation/Gait assistance: Min guard Gait Distance (Feet): 8 Feet(8 ft x 2) Assistive device: Rolling walker (2 wheeled) Gait Pattern/deviations: Trunk flexed, Shuffle, Step-to pattern General Gait Details: pt with sig short shuffle  steps, progressively incresed trunk flexion with fatigue/fear Gait velocity: decreased Gait velocity interpretation: <1.8 ft/sec, indicate of risk for recurrent falls    ADL: ADL Overall ADL's : Needs assistance/impaired Eating/Feeding: Minimal assistance, Sitting Eating/Feeding Details (indicate cue type and reason): issued foam build ups for eating utensils and practice use with pt's ice and magic cup Grooming: Sitting, Wash/dry hands, Wash/dry face, Moderate  assistance Grooming Details (indicate cue type and reason): decreased thoroughness Upper Body Bathing: Maximal assistance, Sitting Lower Body Bathing: Total assistance, +2 for physical assistance, Sit to/from stand Upper Body Dressing : Maximal assistance, Sitting Upper Body Dressing Details (indicate cue type and reason): front opening gown Lower Body Dressing: Total assistance, Bed level Toilet Transfer: Maximal assistance, Control and instrumentation engineer Details (indicate cue type and reason): face to face Toileting- Clothing Manipulation and Hygiene: Total assistance Functional mobility during ADLs: Moderate assistance, +2 for safety/equipment, Rolling walker General ADL Comments: requiring total assist   Cognition: Cognition Overall Cognitive Status: Impaired/Different from baseline Orientation Level: Oriented X4 Cognition Arousal/Alertness: Awake/alert Behavior During Therapy: WFL for tasks assessed/performed(smiling more) Overall Cognitive Status: Impaired/Different from baseline Area of Impairment: Problem solving, Safety/judgement, Following commands Current Attention Level: Sustained Following Commands: Follows one step commands with increased time Safety/Judgement: Decreased awareness of safety, Decreased awareness of deficits Awareness: Intellectual, Emergent Problem Solving: Slow processing General Comments: pt reports significant fear and anxiety related to movement and "not being able to do it" when asked to progress mobility. Pt reporting fatigue and anxiety with most movement, asking for prolonged rest breaks. Able to be redirected and encouraged to relax with guided breathing. Difficult to assess due to: Impaired communication   Blood pressure (!) 141/85, pulse (!) 102, temperature 97.6 F (36.4 C), temperature source Axillary, resp. rate (!) 26, height 5' 6"  (1.676 m), weight 41.3 kg, SpO2 100 %. Physical Exam  Vitals reviewed. Constitutional: She appears  well-developed.  Emaciated.  HENT:  Head: Normocephalic and atraumatic.  Eyes: EOM are normal. Right eye exhibits no discharge. Left eye exhibits no discharge.  Neck: No tracheal deviation present. No thyromegaly present.  Respiratory: Effort normal. No stridor. No respiratory distress.  GI: She exhibits no distension.  Musculoskeletal:     Comments: No edema or tenderness in extremities  Neurological: She is alert.  Motor: Bilateral upper extremities: 3+-4-/5 proximal distal Bilateral lower extremities: Hip flexion 3+-4-/5, knee extension 3 -/5 (limited due to contractures), ankle dorsiflexion 3-3+/5 Sensation intact light touch  Skin:  Scattered abrasions Peripheral cyanosis  Psychiatric: Her affect is blunt. Her speech is delayed. She is slowed.    Results for orders placed or performed during the hospital encounter of 07/03/19 (from the past 48 hour(s))  Glucose, capillary     Status: Abnormal   Collection Time: 08/21/19  4:19 PM  Result Value Ref Range   Glucose-Capillary 148 (H) 70 - 99 mg/dL  Glucose, capillary     Status: Abnormal   Collection Time: 08/21/19  7:43 PM  Result Value Ref Range   Glucose-Capillary 68 (L) 70 - 99 mg/dL  Glucose, capillary     Status: Abnormal   Collection Time: 08/21/19  7:47 PM  Result Value Ref Range   Glucose-Capillary 125 (H) 70 - 99 mg/dL  Glucose, capillary     Status: Abnormal   Collection Time: 08/21/19 11:46 PM  Result Value Ref Range   Glucose-Capillary 119 (H) 70 - 99 mg/dL   Comment 1 Notify RN    Comment 2 Document in Chart   Renal function  panel     Status: Abnormal   Collection Time: 08/22/19  3:16 AM  Result Value Ref Range   Sodium 124 (L) 135 - 145 mmol/L   Potassium 5.3 (H) 3.5 - 5.1 mmol/L   Chloride 83 (L) 98 - 111 mmol/L   CO2 23 22 - 32 mmol/L   Glucose, Bld 106 (H) 70 - 99 mg/dL   BUN 142 (H) 8 - 23 mg/dL   Creatinine, Ser 3.83 (H) 0.44 - 1.00 mg/dL   Calcium 9.1 8.9 - 10.3 mg/dL   Phosphorus 4.7 (H) 2.5 -  4.6 mg/dL   Albumin 2.8 (L) 3.5 - 5.0 g/dL   GFR calc non Af Amer 11 (L) >60 mL/min   GFR calc Af Amer 13 (L) >60 mL/min   Anion gap 18 (H) 5 - 15    Comment: Performed at [facility], [address], [place], [place] [address]  CBC     Status: Abnormal   Collection Time: 08/22/19  3:16 AM  Result Value Ref Range   WBC 3.1 (L) 4.0 - 10.5 K/uL   RBC 4.31 3.87 - 5.11 MIL/uL   Hemoglobin 11.8 (L) 12.0 - 15.0 g/dL   HCT 38.8 36.0 - 46.0 %   MCV 90.0 80.0 - 100.0 fL   MCH 27.4 26.0 - 34.0 pg   MCHC 30.4 30.0 - 36.0 g/dL   RDW 19.7 (H) 11.5 - 15.5 %   Platelets 127 (L) 150 - 400 K/uL   nRBC 0.0 0.0 - 0.2 %    Comment: Performed at [facility], [place] [address], [place], [place] [address]  Glucose, capillary     Status: Abnormal   Collection Time: 08/22/19  3:46 AM  Result Value Ref Range   Glucose-Capillary 115 (H) 70 - 99 mg/dL   Comment 1 Notify RN    Comment 2 Document in Chart   Glucose, capillary     Status: Abnormal   Collection Time: 08/22/19 11:32 AM  Result Value Ref Range   Glucose-Capillary 143 (H) 70 - 99 mg/dL   Comment 1 Notify RN    Comment 2 Document in Chart   Glucose, capillary     Status: Abnormal   Collection Time: 08/22/19  4:34 PM  Result Value Ref Range   Glucose-Capillary 155 (H) 70 - 99 mg/dL   Comment 1 Notify RN    Comment 2 Document in Chart   Glucose, capillary     Status: Abnormal   Collection Time: 08/22/19  7:32 PM  Result Value Ref Range   Glucose-Capillary 143 (H) 70 - 99 mg/dL   Comment 1 Notify RN    Comment 2 Document in Chart   Glucose, capillary     Status: Abnormal   Collection Time: 08/22/19 11:44 PM  Result Value Ref Range   Glucose-Capillary 106 (H) 70 - 99 mg/dL   Comment 1 Notify RN    Comment 2 Document in Chart   Renal function panel     Status: Abnormal   Collection Time: 08/23/19  2:59 AM  Result Value Ref Range   Sodium 133 (L) 135 - 145 mmol/L   Potassium 4.7 3.5 - 5.1 mmol/L   Chloride 93 (L) 98 - 111  mmol/L   CO2 24 22 - 32 mmol/L   Glucose, Bld 122 (H) 70 - 99 mg/dL   BUN 72 (H) 8 - 23 mg/dL   Creatinine, Ser 2.33 (H) 0.44 - 1.00 mg/dL    Comment: DELTA  CHECK NOTED   Calcium 8.8 (L) 8.9 - 10.3 mg/dL   Phosphorus 2.8 2.5 - 4.6 mg/dL   Albumin 2.8 (L) 3.5 - 5.0 g/dL   GFR calc non Af Amer 21 (L) >60 mL/min   GFR calc Af Amer 24 (L) >60 mL/min   Anion gap 16 (H) 5 - 15    Comment: Performed at [facility] [address] [place], [place] [address]  CBC     Status: Abnormal   Collection Time: 08/23/19  2:59 AM  Result Value Ref Range   WBC 3.3 (L) 4.0 - 10.5 K/uL   RBC 4.63 3.87 - 5.11 MIL/uL   Hemoglobin 12.7 12.0 - 15.0 g/dL   HCT 44.2 36.0 - 46.0 %   MCV 95.5 80.0 - 100.0 fL   MCH 27.4 26.0 - 34.0 pg   MCHC 28.7 (L) 30.0 - 36.0 g/dL   RDW 20.7 (H) 11.5 - 15.5 %   Platelets 128 (L) 150 - 400 K/uL   nRBC 0.0 0.0 - 0.2 %    Comment: Performed at [facility], [place] [address], [place], [place] [address]  Glucose, capillary     Status: Abnormal   Collection Time: 08/23/19  3:24 AM  Result Value Ref Range   Glucose-Capillary 125 (H) 70 - 99 mg/dL   Comment 1 Notify RN    Comment 2 Document in Chart    No results found.     Medical Problem List and Plan: 1.  Deficits with mobility, transfers, endurance, self-care secondary to debility.   -patient may may shower  -ELOS/Goals: 13-17 days/Supervision  Admit to CIR 2.  Antithrombotics: -DVT/anticoagulation:  Pharmaceutical: Heparin  -antiplatelet therapy: N/A 3. Peripheral neuropathy/Pain Management:  Tylenol bid with oxycodone prn.   4. Mood: Team to provide ego support. LCSW to follow for evaluation and support.   -antipsychotic agents: N/A 5. Neuropsych: This patient is not fully capable of making decisions on her own behalf. 6. Skin/Wound Care: Santyl with damp to dry dressing for sacral decub. Add Vitamin C/Zinc for healing. ON tube feeds for nutritional support due to FTT. Air mattress overlay.  7.  Fluids/Electrolytes/Nutrition: Strict I/O with daily weights. Regal diet with 1500 cc FR.  8. 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 Cont Cardizem. 9. A fib with RVR: Not felt to be a candidate [facility] due to Raynaurds. Monitor HR tid --rate controlled on Cardizem.  Monitor with increased mobility.  10. Leucopenia: Stable--follow up with Heme/onc for work up after discharge.    CBC ordered for tomorrow. 11. Metabolic encephalopathy: Treated with IV thiamine and has improved with decrease in narcotics/sedatives as well as initiation of HD 12. ESRD: HD dependent due to progression of AKI. [facility] 13. Pericardial effusion/Pleural effusion:  S/p pericardial window 12/2. S/p thoracocentesis  14. Hepatic Steatosis: Abdominal pain with pruritis. [facility]   CMP ordered for tomorrow AM 15. Malnutrition: Encourage po intake as able. Continue tube feeds.  16. Anemia of chronic disease:  CBC ordered for tomorrow AM 17. Anxiety disorder with panic attacks: On Zoloft with alprazolam tid prn.  18. Thrombocytopenia: Stable without signs of bleeding.   CBC ordered for tomorrow AM  PaBary LerichePA-C 08/23/2019  I have personally performed a face to face diagnostic evaluation, including, but not limited to relevant history and physical exam findings, of  this patient and developed relevant assessment and plan.  Additionally, I have reviewed and concur with the physician assistant's documentation above.  Delice Lesch, MD, ABPMR The patient's status has not changed. The original post admission physician evaluation remains appropriate, and any changes from the pre-admission screening or documentation from the acute chart are noted above.   Delice Lesch, MD, ABPMR

## 2019-08-23 NOTE — Discharge Summary (Addendum)
Physician Discharge Summary  [name] UGQ:[id number] DOB: 1950-12-20 DOA: 07/03/2019  PCP: Renaldo Reel, DO  Admit date: 07/03/2019 Discharge date: 08/23/2019  Admitted From: Home  Discharge disposition: CIR   Recommendations for Outpatient Follow-Up:   . Follow up with your primary care provider at the rehab facility. . Check CBC, BMP daily . Please follow-up prednisone taper and continue prednisone 30 mg daily until seen by Dr. Kathlene November at [facility] Rheumatology as outpatient. . Follow-up with [facility] rheumatology after discharge. . Please offer Xanax prior to hemodialysis which seems to help her. . 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  Diet recommendation: Low sodium, heart healthy.  Fluid restriction 1500 mL/day.  Wound care:  Decubitus care  Code status: Full.   History of Present Illness:   69 year old female with a history of hypertension, chronic pericardial effusion, moderate aortic valve regurgitation, former smoker, alcohol abuse initially admitted on 07/06/2019 for progressive shortness of breath with anasarca and fatigue. Patient was previously worked up by [facility] rheumatology with negative work-up including ESR, lupus anticoagulant, rheumatoid factor, TSH, ANCA, ANA, light free chain studies. 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 Since that time, patient had stopped drinking alcohol as advised. She has been on diuretics and spironolactone without any improvement in symptoms.   CT of the chest on 07/04/2019 noted moderate pericardial effusion and airspace disease. She underwent thoracentesis by PCCM which removed 700 mL of the fluid. Patient underwent right heart cath on 07/06/2019 and pericardial window on 07/12/2019. Attempts were made to diurese with IV diuretics but patient with worsening kidney function with creatinine up to 5. Nephrology was formally consulted patient was started on dialysis. She has been having poor p.o. intake so cortrak tube was placed. She was then later placed on PEG tube for persistent poor p.o. intake, also patient developed oral pain after NG tube placement. She is on hemodialysis for AKI with rapid progression to end-stage renal disease, severe anasarca, pericardial effusion, status post pericardial window.   Her labs showed RF positive at 71, INT RNA polymerase antibody +146 concern for scleroderma also she had some thickening of the skin with renal failure and pericardial involvement. Pulmonary was consulted to evaluate for ILD. On discussion with rheumatology at [facility]  recommended supportive care, acid reflux treated with PPI, joint pain Voltaren gel and Tylenol and Prednisone taper. For Raynaud's phenomenon, calcium channel blocker was advised. If patient has interstitial lung disease then she should be evaluated for immunotherapy can be as outpatient when stronger. Rheumatology did not recommend immunotherapy for just joint pain and skin manifestation. Dr. Aryal,rheumatologist was consulted on phone. He recommended to start high-dose IV Solu-Medrol 125 mg daily for 3 days  and then prednisone 60 mg daily for week then  prednisone 50 mg daily for a week then prednisone 40 mg daily for a week then prednisone 30 mg daily for a week which can be continued till seen by rheumatologist as outpatient.   Hospital Course:   Following conditions were addressed during hospitalization as listed below,  Nausea, sickness during hemodialysis.  Patient benefits from Xanax prior to hemodialysis  Mild hyponatremia, borderline hyperkalemia, improved with hemodialysis..   Scleroderma /anasarca/joint pain/pericardial effusion-patient presented with anasarca, failure to thrive, joint pains, pericardial effusion, Raynaud's phenomenon. She had positive anti-RNA polymerase 3 antibody.  Rheumatology evaluation in September 2020 showed CRP 13, ESR 25, ANA negative, c-ANCA/p-ANCA negative, scleroderma SCL 70 -, SSS (ro) negative, M spike negative electrophoresis, HIV negative. Double-stranded DNA negative, ANA negative, RF 71.     Rheumatologist Dr. Kathlene November,  recommended  IV Solu-Medrol 125 mg for 3 day followed by60 mg prednisone daily for a week then wean down to 50 mg daily for a week then 40 mg daily for a week then 30 mg daily. Patient can then continue with prednisone 30 mg daily and follow-up with rheumatology as outpatient. On prednisone 60-milligramdaily for 1 week from 08/14/2019.  Raynaud's phenomenon/crest syndrome with cold hands/ peripheral cyanosis, likely due to underlying scleroderma. Beta-blocker has been discontinued and switched to Cardizem. Vascular surgery was consulted for this- impression is Raynaud's phenomena and no further intervention is planned.  Atrial fibrillation RVR.  Patient is not a good candidate for anticoagulation considering multiple comorbid conditions, pericardial window, ESRD and frailty.  Due to worsening Raynaud's phenomena, beta-blockers were changed to calcium channel blocker.    Leukopenia.   CT chest  was negative, CT abdomen/pelvis on 1/16 was unremarkable , repeat SPEP showed  no monoclonal protein with normal kappa lambda ratio. She also had negative pleural fluid cytology x4.   Most recent WBC count of 4.9>3.1>3.3.  No fever  Acute kidney injury now on hemodialysis due to progressive deterioration of renal function and anuria.  Nephrology on board. On hemodialysis. Continue hemodialysis Tuesday, Thursday and Saturday.  Renal navigator on board.  Metabolic encephalopathy-improved after hemodialysis and cutting down on narcotics. Patient also received IV thiamine.  Will need to try to avoid/decrease narcotics/ sedatives if possible.  Acute hypoxic respiratory failure/pulmonary edema/pleural effusion-improved. On room air currently. Underwent repeat thoracentesis on 08/01/2019. Removed 1.1 L from left and 500 mL from right. Pleural cytology showed reactive mesothelial cells. Pleural fluid culture showed no growth.   Pericardial effusion with elevated troponin- Patient is status post pericardial window on 07/12/2019. Pathology showed no malignancy, chronic inflammation, AFB negative. Fungal culture  Negative.  Atypical pneumonia with hypoxia-completed antibiotic treatment.  Currently on room air  Severe malnutrition/failure to thrive- seen by speech therapy and has been started on regular diet.    Patient is also on PEG tube feeding.  Dysphagia/thrush-group A strep was negative on 07/28/2019, patient currently on oral nystatin.  Continue with nystatin swish and swallow  Anxiety-continue Zoloft, Xanax 0.25 mg 3 times daily as needed for anxiety attack.   Back pain-continue with oxycodone as needed, Neurontin, Voltaren gel twice a day.  Hypertension-continue Cardizem  Sacral decubitus ulcer stage II present on admission.  Continue wound care.  Disposition.  At this time, patient is stable for disposition to CIR.  Spoke with the patient husband at bedside regarding the plan for disposition to CIR.  Medical Consultants:     Oncology  Nephrology  Palliative care  PMR  Procedures:     Hemodialysis  Thoracocentesis   right heart cath on 07/06/2019    pericardial window on 07/12/2019.  PEG tube placement  Subjective:   Today, patient is okay overall.  Has mild burning sensation in her throat when she eats food.  Denies nausea vomiting or increasing shortness of breath.  Discharge Exam:   Vitals:   08/23/19 0326 08/23/19 0744  BP: 140/90 (!) 141/85  Pulse:  (!) 102  Resp: 18 (!) 26  Temp: 97.8 F (36.6 C) 97.6 F (36.4 C)  SpO2:  100%   Vitals:   08/22/19 2347 08/23/19 0326 08/23/19 0644 08/23/19 0744  BP: 139/85 140/90  (!) 141/85  Pulse: 86   (!) 102  Resp: (!) 26 18  (!) 26  Temp: 97.7 F (36.5 C) 97.8 F (36.6 C)  97.6 F (36.4 C)  TempSrc: Axillary Axillary  Axillary  SpO2: 100%   100%  Weight:   41.3 kg   Height:       General: Alert awake, not in obvious distress, thinly built, deconditioned and frail.  Weak voice HENT: pupils equally reacting to light,  No scleral pallor or icterus noted.  Left internal jugular catheter in place.   Chest:  Clear breath sounds.  Diminished breath sounds bilaterally. No crackles or wheezes.  CVS: S1 &S2 heard. No murmur.  Mildly tachycardic Abdomen: Soft, nontender, nondistended.  Bowel sounds are heard.  PEG tube in place Extremities: Cold hands with cyanosis of the digits noted.  Psych: Alert, awake and oriented,  CNS:  No cranial nerve deficits.  Generalized weakness of the extremities noted Skin: Warm and dry.  Digital cyanosis  The results of significant diagnostics from this hospitalization (including imaging, microbiology, ancillary and laboratory) are listed below for reference.     Diagnostic Studies:   DG Chest 2 View  Result Date: 07/03/2019 CLINICAL DATA:  Shortness of breath worsening for 1 week, anasarca, liver disease EXAM: CHEST - 2 VIEW COMPARISON:  04/05/2019 FINDINGS: Enlargement of cardiac silhouette.  Mediastinal contours and pulmonary vascularity normal. Peribronchial thickening and mild bibasilar atelectasis. No definite acute infiltrate or pneumothorax. Tiny bibasilar effusions blunt the posterior costophrenic angles. Mild scattered endplate spur formation thoracic spine. IMPRESSION: Bronchitic changes with mild bibasilar atelectasis and tiny pleural effusions. Electronically Signed   By: Lavonia Dana M.D.   On: 07/03/2019 18:06   CT Angio Chest PE W and/or Wo Contrast  Result Date: 07/03/2019 CLINICAL DATA:  PE suspected, high pretest probability. EXAM: CT ANGIOGRAPHY CHEST WITH CONTRAST TECHNIQUE: Multidetector CT imaging of the chest was performed using the standard protocol during bolus administration of intravenous contrast. Multiplanar CT image reconstructions and MIPs were obtained to evaluate the vascular anatomy. CONTRAST:  12m OMNIPAQUE IOHEXOL 350 MG/ML SOLN COMPARISON:  Chest radiograph 07/03/2019, CT chest 04/05/2019 FINDINGS: Cardiovascular: Satisfactory opacification the pulmonary arteries to the segmental level. No pulmonary artery filling defects are identified. Normal caliber central pulmonary arteries. No elevation of the RV/LV ratio (0.6). Cardiac size at the upper limits of normal. Moderate pericardial effusion. Thoracic aorta is normal caliber. Normal 3 vessel branching of the arch. Minimal atherosclerotic plaque throughout the aorta and branch vessels. Luminal evaluation precluded by suboptimal contrast opacification. Mediastinum/Nodes: Scattered low-attenuation mediastinal and hilar nodes are present. No pathologically enlarged mediastinal, hilar or axillary lymph nodes are seen. Thyroid gland and thoracic inlet are unremarkable. No acute abnormality of the trachea or esophagus. Lungs/Pleura: Multifocal areas of interstitial and airspace opacity with a peripheral predominance seen throughout  both lungs. Some interlobular septal thickening is noted in the lung apices. There are  small moderate bilateral pleural effusions, right slightly greater than left with extension into the fissures. No pneumothorax. Upper Abdomen: No acute abnormalities present in the visualized portions of the upper abdomen. Musculoskeletal: Multilevel degenerative changes are present in the imaged portions of the spine. Multilevel flowing anterior osteophytosis, compatible with features of diffuse idiopathic skeletal hyperostosis (DISH). Additional mild degenerative changes in the shoulders. Review of the MIP images confirms the above findings. IMPRESSION: 1. No evidence of pulmonary embolus. Multifocal areas of interstitial and airspace opacity with septal thickening bilateral pleural effusions, and pericardial effusion is worrisome for an acute infection process with superimposed edema/heart failure. 2. Scattered subcentimeter low-attenuation mediastinal and hilar adenopathy, likely reactive. 3.  Aortic Atherosclerosis (ICD10-I70.0). Electronically Signed   By: Lovena Le M.D.   On: 07/03/2019 20:11   DG Chest Port 1 View  Result Date: 07/04/2019 CLINICAL DATA:  Pneumothorax, history of hypertension EXAM: PORTABLE CHEST 1 VIEW COMPARISON:  07/04/2019 FINDINGS: Heart size remains enlarged. There is signs of bilateral pleural effusion. No signs of pneumothorax. Interstitial and airspace opacities throughout both right and left chest persist. Worsening opacification particularly at the left lung base, to a lesser extent on the right. No acute bone finding. IMPRESSION: 1. Increasing basilar opacification may represent enlarging effusions are worsening consolidative changes particularly on the left. 2. Cardiomegaly as before. 3. No signs of pneumothorax. 4. Small bilateral effusions. Electronically Signed   By: Zetta Bills M.D.   On: 07/04/2019 20:39   DG Chest Port 1 View  Result Date: 07/04/2019 CLINICAL DATA:  Status post thoracentesis, bedside EXAM: PORTABLE CHEST 1 VIEW COMPARISON:  CT angiogram  chest 07/03/2019, chest radiograph 07/03/2019. FINDINGS: Unchanged cardiomegaly. Interstitial and ill-defined airspace opacities throughout both lungs have increased from prior chest radiograph 07/03/2019. Persistent although decreased left basilar opacity which may reflect incomplete atelectasis, consolidation and/or minimal residual pleural effusion. A curvilinear vertically oriented line projects over the left hemithorax. This may reflect a prominent skin fold (lung markings are seen distally) or pneumothorax. As per the radiographer, the ordering clinician is aware of this finding at the time of interpretation. No evidence of acute bony abnormality. Overlying cardiac monitoring leads. IMPRESSION: A curvilinear vertically oriented line projects over the left hemithorax, which may reflect a prominent skin fold (lung markings are seen distally) or pneumothorax. Patient repositioning with repeat radiograph recommended. Interstitial and ill-defined airspace opacities throughout both lungs have increased from prior chest radiograph 07/03/2019. Findings may reflect worsening pulmonary edema or infection. Persistent although decreased opacity at the left lung base which may reflect incomplete atelectasis, consolidation and/or minimal residual pleural effusion. Unchanged cardiomegaly. Electronically Signed   By: Kellie Simmering DO   On: 07/04/2019 14:13   DG Chest Port 1V same Day  Result Date: 07/04/2019 CLINICAL DATA:  Follow-up for possible pneumothorax status post thoracentesis. EXAM: PORTABLE CHEST 1 VIEW COMPARISON:  Chest radiograph performed earlier the same day 07/04/2019, CT angiogram chest 07/03/2019. FINDINGS: Unchanged cardiomegaly. A previously questioned left-sided pneumothorax is not well appreciated on the current study. Persistent mild left basilar opacity which may reflect atelectasis, consolidation and/or small residual pleural effusion. Unchanged bilateral interstitial and ill-defined airspace  opacities throughout both lungs. Findings may reflect pulmonary edema or infection. Overlying cardiac monitoring leads. IMPRESSION: A previously questioned left-sided pneumothorax is not well appreciated on the current study and may have reflected a skin fold. 6 hour chest x-ray follow-up is recommended for confirmation.  Otherwise, no significant interval change as compared to chest radiograph performed earlier the same day at 1:36 p.m. Electronically Signed   By: Kellie Simmering DO   On: 07/04/2019 15:29   ECHOCARDIOGRAM LIMITED  Result Date: 07/04/2019   ECHOCARDIOGRAM LIMITED REPORT   Patient Name:   Joanna Ali Date of Exam: 07/03/2019 Medical Rec #:  [id number]    Height:       66.0 in Accession #:    [id number]   Weight:       117.0 lb Date of Birth:  10-Oct-1950    BSA:          1.59 m Patient Age:    10 years     BP:           175/111 mmHg Patient Gender: F            HR:           108 bpm. Exam Location:  Inpatient  Procedure: Limited Echo, Limited Color Doppler and Cardiac Doppler                     STAT ECHO Reported to: Dr. Marletta Lor on 07/03/2019 10:40:00 PM. Indications:     pericardial effusion  History:         Patient has prior history of Echocardiogram examinations, most                  recent 03/28/2019.  Sonographer:     Johny Chess Referring Phys:  [id number] Milus Banister Diagnosing Phys: Sanda Klein MD IMPRESSIONS  1. Mild global left ventricular hypokinesis with LV EF approximately 50%.  2. The left ventricle has no regional wall motion abnormalities.  3. Left ventricular diastolic parameters are consistent with Grade II diastolic dysfunction (pseudonormalization).  4. Elevated left atrial pressure.  5. Moderate circumferential pericardial effusion.  6. There is inversion of the right atrial wall. No other signs of tamponade are identified.  7. The tricuspid valve is grossly normal. Tricuspid valve regurgitation is moderate.  8. The pulmonic valve was not well visualized. Pulmonic valve  regurgitation is not visualized.  9. Severely elevated pulmonary artery systolic pressure. 10. The tricuspid regurgitant velocity is 3.85 m/s, and with an assumed right atrial pressure of 8 mmHg, the estimated right ventricular systolic pressure is severely elevated at 67.3 mmHg. 11. The inferior vena cava is normal in size with <50% respiratory variability, suggesting right atrial pressure of 8 mmHg. 12. Aortic valve regurgitation not evaluated. 13. The aortic valve is grossly normal. Aortic valve regurgitation not evaluated. 14. Left ventricular ejection fraction, by visual estimation, is 50 to 55%. The left ventricle has low normal function. There is no left ventricular hypertrophy. 15. The mitral valve is grossly normal. Moderate mitral valve regurgitation. 16. Global right ventricle has normal systolic function.The right ventricular size is normal. No increase in right ventricular wall thickness. 24. Compared to 04/05/2019, the left ventricular systolic function has diminished. The pericardial effusion is unchanged in size and there are still only incomplete findings to support tamponade. Mitral and tricuspid insufficiency are worse. The degree of pulmonary hypertension appears similar. 18. The diagnosis of pericardial tamponade has reduced sensitivity in the setting of severe pulmonary hypertension. Invasive hemodynamic assessment may be appropriate. FINDINGS  Left Ventricle: Left ventricular ejection fraction, by visual estimation, is 50 to 55%. The left ventricle has low normal function. The left ventricle has no regional wall motion abnormalities. The left ventricular internal cavity size was the  left ventricle is normal in size. There is no increased left ventricular wall thickness. Left ventricular diastolic parameters are consistent with Grade II diastolic dysfunction (pseudonormalization). Elevated left atrial pressure. Mild global left ventricular hypokinesis with LV EF approximately 50%. Right  Ventricle: The right ventricular size is normal. No increase in right ventricular wall thickness. Global RV systolic function is has normal systolic function. The tricuspid regurgitant velocity is 3.85 m/s, and with an assumed right atrial pressure  of 8 mmHg, the estimated right ventricular systolic pressure is severely elevated at 67.3 mmHg. Left Atrium: Left atrial size was normal in size. Right Atrium: Right atrial size was normal in size. Right atrial pressure is estimated at 8 mmHg. Pericardium: A moderately sized pericardial effusion is present. The pericardial effusion is circumferential. There is inversion of the right atrial wall. Mitral Valve: The mitral valve is normal in structure. Moderate mitral valve regurgitation, with centrally-directed jet. Tricuspid Valve: The tricuspid valve is grossly normal. Tricuspid valve regurgitation moderate. Aortic Valve: The aortic valve is grossly normal. The aortic valve is normal in structure. Aortic valve regurgitation not evaluated. Pulmonic Valve: The pulmonic valve was not well visualized. Pulmonic valve regurgitation is not visualized. Aorta: The aortic root is normal in size and structure. Venous: The inferior vena cava is normal in size with less than 50% respiratory variability, suggesting right atrial pressure of 8 mmHg. Additional Comments: The diagnosis of pericardial tamponade has reduced sensitivity in the setting of severe pulmonary hypertension. Invasive hemodynamic assessment may be appropriate.  LEFT VENTRICLE         Normals PLAX 2D LVIDd:         5.09 cm 3.6 cm LVIDs:         3.56 cm 1.7 cm LV PW:         1.14 cm 1.4 cm LV IVS:        1.02 cm 1.3 cm LV SV:         70 ml   79 ml LV SV Index:   44.77   45 ml/m2  LEFT ATRIUM         Index LA diam:    3.55 cm 2.23 cm/m  TRICUSPID VALVE             Normals TR Peak grad:   59.3 mmHg TR Vmax:        385.00 cm/s 288 cm/s  Dani Gobble Croitoru MD Electronically signed by Sanda Klein MD Signature Date/Time:  07/04/2019/8:55:54 AMThe mitral valve is normal in structure.    Final (Updated)      Labs:   Basic Metabolic Panel: Recent Labs  Lab 08/18/19 0240 08/18/19 0240 08/19/19 0071 08/19/19 2197 08/20/19 0302 08/20/19 0302 08/21/19 0316 08/21/19 0316 08/22/19 0316 08/23/19 0259  NA 133*   < > 131*  --  133*  --  126*  --  124* 133*  K 4.2   < > 4.6   < > 4.2   < > 4.7   < > 5.3* 4.7  CL 93*   < > 87*  --  94*  --  87*  --  83* 93*  CO2 27   < > 21*  --  24  --  22  --  23 24  GLUCOSE 124*   < > 76  --  116*  --  100*  --  106* 122*  BUN 59*   < > 111*  --  58*  --  102*  --  142* 72*  CREATININE 2.06*   < > 3.22*  --  2.00*  --  3.01*  --  3.83* 2.33*  CALCIUM 9.0   < > 9.2  --  9.2  --  9.1  --  9.1 8.8*  MG 2.4  --   --   --   --   --   --   --   --   --   PHOS 4.7*   < > 6.1*  --  3.4  --  4.8*  --  4.7* 2.8   < > = values in this interval not displayed.   GFR Estimated Creatinine Clearance: 14.6 mL/min (A) (by C-G formula based on SCr of 2.33 mg/dL (H)). Liver Function Tests: Recent Labs  Lab 08/18/19 0240 08/18/19 0240 08/19/19 0650 08/20/19 0302 08/21/19 0316 08/22/19 0316 08/23/19 0259  AST 50*  --   --   --   --   --   --   ALT 22  --   --   --   --   --   --   ALKPHOS 104  --   --   --   --   --   --   BILITOT 0.4  --   --   --   --   --   --   PROT 5.4*  --   --   --   --   --   --   ALBUMIN 3.0*   < > 3.0* 3.0* 2.7* 2.8* 2.8*   < > = values in this interval not displayed.   No results for input(s): LIPASE, AMYLASE in the last 168 hours. No results for input(s): AMMONIA in the last 168 hours. Coagulation profile No results for input(s): INR, PROTIME in the last 168 hours.  CBC: Recent Labs  Lab 08/17/19 0303 08/18/19 0240 08/19/19 0650 08/22/19 0316 08/23/19 0259  WBC 6.4 6.7 4.9 3.1* 3.3*  HGB 13.3 12.4 12.6 11.8* 12.7  HCT 45.2 42.7 42.9 38.8 44.2  MCV 93.0 93.8 93.1 90.0 95.5  PLT 119* 135* 107* 127* 128*   Cardiac Enzymes: No results for  input(s): CKTOTAL, CKMB, CKMBINDEX, TROPONINI in the last 168 hours. BNP: Invalid input(s): POCBNP CBG: Recent Labs  Lab 08/22/19 1132 08/22/19 1634 08/22/19 1932 08/22/19 2344 08/23/19 0324  GLUCAP 143* 155* 143* 106* 125*   D-Dimer No results for input(s): DDIMER in the last 72 hours. Hgb A1c No results for input(s): HGBA1C in the last 72 hours. Lipid Profile No results for input(s): CHOL, HDL, LDLCALC, TRIG, CHOLHDL, LDLDIRECT in the last 72 hours. Thyroid function studies No results for input(s): TSH, T4TOTAL, T3FREE, THYROIDAB in the last 72 hours.  Invalid input(s): FREET3 Anemia work up No results for input(s): VITAMINB12, FOLATE, FERRITIN, TIBC, IRON, RETICCTPCT in the last 72 hours. Microbiology Recent Results (from the past 240 hour(s))  MRSA PCR Screening     Status: None   Collection Time: 08/21/19  9:45 AM   Specimen: Nasal Mucosa; Nasopharyngeal  Result Value Ref Range Status   MRSA by PCR NEGATIVE NEGATIVE Final    Comment:        The GeneXpert MRSA Assay (FDA approved for NASAL specimens only), is one component of a comprehensive MRSA colonization surveillance program. It is not intended to diagnose MRSA infection nor to guide or monitor treatment for MRSA infections. Performed at [facility], [place] [address] [place], [place] [address]      Discharge Instructions:   Discharge Instructions  Diet regular   Complete by: As directed    Soft, fluid restriction 1514m/day   Discharge instructions   Complete by: As directed    Continue to take a prednisone taper as advised.  [facility]   Increase activity slowly   Complete by: As directed    As per CIR     Allergies as of 08/23/2019   No Known Allergies     Medication List    STOP taking these medications   gabapentin 300 MG capsule Commonly known as: NEURONTIN Replaced by: gabapentin 250 MG/5ML solution    multivitamin with minerals tablet   PreserVision AREDS 2 Caps   senna 8.6 MG Tabs tablet Commonly known as: SENOKOT   spironolactone 100 MG tablet Commonly known as: ALDACTONE   torsemide 20 MG tablet Commonly known as: DEMADEX   traMADol 50 MG tablet Commonly known as: ULTRAM   TURMERIC PO     TAKE these medications   acetaminophen 160 MG/5ML solution Commonly known as: TYLENOL Take 15.6 mLs (500 mg total) by mouth every 6 (six) hours as needed.   ALPRAZolam 0.25 MG tablet Commonly known as: XANAX Take 1 tablet (0.25 mg total) by mouth 3 (three) times daily as needed for anxiety (give prior to hemodialysis).   antiseptic oral rinse Liqd 15 mLs by Mouth Rinse route as needed for dry mouth.   bisacodyl 5 MG EC tablet Commonly known as: DULCOLAX Take 2 tablets (10 mg total) by mouth daily as needed for moderate constipation.   bisacodyl 10 MG suppository Commonly known as: DULCOLAX Place 1 suppository (10 mg total) rectally daily as needed for moderate constipation.   camphor-menthol lotion Commonly known as: SARNA Apply topically as needed for itching.   collagenase ointment Commonly known as: SANTYL Apply topically 2 (two) times daily.   diclofenac Sodium 1 % Gel Commonly known as: VOLTAREN Apply 2 g topically 2 (two) times daily.   diltiazem 120 MG 24 hr capsule Commonly known as: CARDIZEM CD Take 1 capsule (120 mg total) by mouth daily.   famotidine 10 MG tablet Commonly known as: PEPCID Take 1 tablet (10 mg total) by mouth daily. Start taking on: August 24, 2019   feeding supplement (NEPRO CARB STEADY) Liqd Place 1,000 mLs into feeding tube continuous.   feeding supplement (PRO-STAT SUGAR FREE 64) Liqd Place 30 mLs into feeding tube daily. Start taking on: August 24, 2019   ferric citrate 1 GM 210 MG(Fe) tablet Commonly known as: AURYXIA Take 2 tablets (420 mg total) by mouth 3 (three) times daily with meals.   gabapentin 250 MG/5ML  solution Commonly known as: NEURONTIN Place 2 mLs (100 mg total) into feeding tube every 12 (twelve) hours. Replaces: gabapentin 300 MG capsule   Gerhardt's butt cream Crea Apply 1 application topically 3 (three) times daily.   heparin 5000 UNIT/ML injection Inject 1 mL (5,000 Units total) into the skin every 8 (eight) hours.   hydrOXYzine 25 MG tablet Commonly known as: ATARAX/VISTARIL Take 0.5 tablets (12.5 mg total) by mouth 2 (two) times daily as needed for itching.   loratadine 10 MG tablet Commonly known as: CLARITIN Take 1 tablet (10 mg total) by mouth daily. Start taking on: August 24, 2019   multivitamin Tabs tablet Take 1 tablet by mouth at bedtime.   nystatin 100000 UNIT/ML suspension Commonly known as: MYCOSTATIN Use as directed 5 mLs (500,000 Units total) in the mouth or throat 4 (four) times daily.  oxyCODONE 5 MG immediate release tablet Commonly known as: Oxy IR/ROXICODONE Take 1 tablet (5 mg total) by mouth every 8 (eight) hours as needed for moderate pain.   pantoprazole sodium 40 mg/20 mL Pack Commonly known as: PROTONIX Place 20 mLs (40 mg total) into feeding tube 2 (two) times daily.   polyethylene glycol 17 g packet Commonly known as: MIRALAX / GLYCOLAX Take 17 g by mouth daily. Start taking on: August 24, 2019   predniSONE 10 MG tablet Commonly known as: DELTASONE Take 5 tablets (50 mg total) by mouth daily for 6 days, THEN 4 tablets (40 mg total) daily for 7 days, THEN 3 tablets (30 mg total) daily. Start taking on: August 23, 2019   senna-docusate 8.6-50 MG tablet Commonly known as: Senokot-S Take 1 tablet by mouth 2 (two) times daily.   sertraline 25 MG tablet Commonly known as: ZOLOFT Take 25 mg by mouth 2 (two) times daily.   Systane Balance 0.6 % Soln Generic drug: Propylene Glycol Place 1 drop into both eyes 2 (two) times daily as needed (dry eyes).   thiamine 100 MG tablet Take 1 tablet (100 mg total) by mouth daily. Start  taking on: August 24, 2019      Follow-up Information    Lajuana Matte, MD. Go on 08/11/2019.   Specialty: Cardiothoracic Surgery Why: Appointment time is at 1:00 pm Contact information: [place] [id number] [phone]            Time coordinating discharge: 39 minutes  Signed:  Raymona Boss  [facility] 08/23/2019, 11:25 AM

## 2019-08-23 NOTE — Progress Notes (Signed)
Physical Therapy Treatment Patient Details Name: Joanna Ali MRN: [id number] DOB: [birth year] Today's Date: 08/23/2019    History of Present Illness Pt is a 69 y.o. female admitted 07/03/19 with anasarca, fatigue and tachycardia. 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 Pt with pericardial effusion s/p pericardial window. PEG tube placed. PMH includes former tobacco and alcohol abuse, HTN, chronic pleural effusion.    PT Comments    Patient seen for mobility progression. Pt up in chair upon arrival and agreeable to participate. Pt perseverating on needing ice chips and feeling nauseous. Pt stood from recliner once then able to stand pivot chair to EOB with min/mod A +2. Continue to progress as tolerated.     Follow Up Recommendations  CIR;Supervision/Assistance - 24 hour(CIR vs SNF due to pt activity tolerance)     Equipment Recommendations  Wheelchair (measurements PT);Wheelchair cushion (measurements PT);Hospital bed    Recommendations for Other Services Rehab consult     Precautions / Restrictions Precautions Precautions: Fall Precaution Comments: PEG tube    Mobility  Bed Mobility Overal bed mobility: Needs Assistance Bed Mobility: Rolling;Sit to Sidelying Rolling: Min guard       Sit to sidelying: Mod assist General bed mobility comments: assist to bring bilat LE into bed and then position hips   Transfers Overall transfer level: Needs assistance Equipment used: Rolling walker (2 wheeled);2 person hand held assist Transfers: Sit to/from [facility] Sit to Stand: Mod assist;+2 safety/equipment;Min assist Stand pivot transfers: Mod assist;+2 physical assistance       General transfer comment: pt stood from recliner with assist to power up and to steady then stand pivot recliner to EOB with +2 HHA; cues for safe hand placement each trial   Ambulation/Gait             General Gait Details: pt stood then c/o nausea and  needing to vomit and declined further attempts of mobility   Stairs             Wheelchair Mobility    Modified Rankin (Stroke Patients Only)       Balance Overall balance assessment: Needs assistance Sitting-balance support: Bilateral upper extremity supported;Feet supported Sitting balance-Leahy Scale: Fair     Standing balance support: Bilateral upper extremity supported;During functional activity Standing balance-Leahy Scale: Poor Standing balance comment: pt reliant on bilat UE support and maintains flexed posture                            Cognition Arousal/Alertness: Awake/alert Behavior During Therapy: Flat affect;Anxious Overall Cognitive Status: Impaired/Different from baseline Area of Impairment: Problem solving;Following commands                       Following Commands: Follows one step commands with increased time     Problem Solving: Slow processing;Decreased initiation;Difficulty sequencing;Requires verbal cues General Comments: pt perseverating on wanting ice chips and "help me. i'm so sick" with reports of nausea      Exercises      General Comments        Pertinent Vitals/Pain Pain Assessment: No/denies pain    Home Living                      Prior Function            PT Goals (current goals can now be found in the care plan section) Progress towards PT goals:  Not progressing toward goals - comment    Frequency    Min 3X/week      PT Plan Current plan remains appropriate    Co-evaluation              AM-PAC PT "6 Clicks" Mobility   Outcome Measure  Help needed turning from your back to your side while in a flat bed without using bedrails?: A Lot Help needed moving from lying on your back to sitting on the side of a flat bed without using bedrails?: A Lot Help needed moving to and from a bed to a chair (including a wheelchair)?: A Little Help needed standing up from a chair using your  arms (e.g., wheelchair or bedside chair)?: A Little Help needed to walk in hospital room?: A Lot Help needed climbing 3-5 steps with a railing? : Total 6 Click Score: 13    End of Session Equipment Utilized During Treatment: Gait belt Activity Tolerance: Other (comment)(c/o nausea) Patient left: with family/visitor present;in bed;with call bell/phone within reach Nurse Communication: Mobility status PT Visit Diagnosis: Unsteadiness on feet (R26.81);Muscle weakness (generalized) (M62.81);Difficulty in walking, not elsewhere classified (R26.2);Pain Pain - Right/Left: (generalized) Pain - part of body: (hands,  legs, sacrum)     Time: 4799-8721 PT Time Calculation (min) (ACUTE ONLY): 34 min  Charges:  $Gait Training: 8-22 mins $Therapeutic Activity: 8-22 mins                     Earney Navy, PTA Acute Rehabilitation Services Pager: [phone] Office: [phone]     Darliss Cheney 08/23/2019, 4:02 PM

## 2019-08-23 NOTE — Progress Notes (Signed)
Patient arrived to unit with Husband at bedside and were greeted by day and night RN. No complaints at this time, call bell in place.   Call placed to [facility] to notify that room was ready at around 1730 following cleaning from prior patient. Spoke with Network engineer and let her know room was available to patient.

## 2019-08-23 NOTE — Progress Notes (Signed)
Patient has been re-accepted for OP HD treatment at [facility] clinic and she will be given a seat schedule closer to her discharge date from CIR. OP HD clinic Medical Director has requested updated records be faxed when patient is discharged from [facility], which Renal Navigator will send. Renal Navigator asks to be updated by CIR CSW when patient is nearing discharge.  Renal Navigator will follow.  Alphonzo Cruise, [place] Renal Navigator [phone]

## 2019-08-23 NOTE — Progress Notes (Signed)
RN received call from [place], [place] on [phone] requesting report on Ms. Carbon going to IP rehab. RN provided report. RN notified bed was not ready would callback when bed ready. [place] called back to [facility] to see if bed ready. RN was told bed ready. Pt transported to [facility]13 by RN's and tech. Pt belonging sent with patient. Husband at beside.

## 2019-08-23 NOTE — Care Management Important Message (Signed)
Important Message  Patient Details  Name: Joanna Ali MRN: [id number] Date of Birth: 1950/10/02   Medicare Important Message Given:  Yes     Memory Argue 08/23/2019, 12:30 PM

## 2019-08-23 NOTE — Progress Notes (Signed)
Inpatient Rehab Admissions Coordinator:   I have a bed available for pt to admit to CIR today and approval from Dr. Louanne Belton.  Will let CSW, pt/family know.   Shann Medal, PT, DPT Admissions Coordinator [phone] 08/23/19  11:33 AM

## 2019-08-23 NOTE — Progress Notes (Signed)
[facility] Progress Note   Assessment/ Plan:   1. 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  Appreciate assistance from rheumatology phone consultation. 2.  AKI--> ESRD: requiring HD since 07/17/20 without any UOP the past 3 weeks per husband and no evidence of renal recovery, s/p 08/22/19.  Next planned 2/4 3.  Pericardial effusion: s/p pericardial window 12/23 4.  Hepatic steatosis: followed at [facility] 5.  Malnutrition: on TF, got PEG, not a lot of PO intake  6.  CKD-MB: Auryxia as binder  7.  Anemia: ESA and iron as appropriate 8.  Afib: poor candidate for [facility] per cardiology 9.  Dispo: pending   Subjective:     Seen in room, reporting some nausea especially before lunchtime.     Objective:   BP 140/90 (BP Location: Left Arm)   Pulse 86   Temp 97.8 F (36.6 C) (Axillary)   Resp 18   Ht 5' 6"  (1.676 m)   Wt 41.3 kg   SpO2 100%   BMI 14.69 kg/m   Physical Exam: Gen: older woman, sitting in bed, speaking in almost a whisper this AM CVS: RRR no rub Resp: bibasilar crackles, improved Abd: soft, NABS, PEG in place Ext: edema improved.  Labs: BMET Recent Labs  Lab 08/17/19 0303 08/18/19 0240 08/19/19 0650 08/20/19 0302 08/21/19 0316 08/22/19 0316 08/23/19 0259  NA 132* 133* 131* 133* 126* 124* 133*  K 4.5 4.2 4.6 4.2 4.7 5.3* 4.7  CL 92* 93* 87* 94* 87* 83* 93*  CO2 22 27 21* 24 22 23 24   GLUCOSE 97 124* 76 116* 100* 106* 122*  BUN 98* 59* 111* 58* 102* 142* 72*  CREATININE 3.23* 2.06* 3.22* 2.00* 3.01* 3.83* 2.33*  CALCIUM 9.5 9.0 9.2 9.2 9.1 9.1 8.8*  PHOS 6.8* 4.7* 6.1* 3.4 4.8* 4.7* 2.8   CBC Recent Labs  Lab 08/18/19 0240 08/19/19 0650 08/22/19 0316 08/23/19 0259  WBC 6.7 4.9 3.1* 3.3*  HGB 12.4 12.6 11.8* 12.7  HCT 42.7 42.9 38.8 44.2  MCV 93.8 93.1 90.0 95.5  PLT 135* 107* 127* 128*      Medications:    . acetaminophen  500 mg Oral BID  . Chlorhexidine Gluconate Cloth  6 each Topical Q0600   . Chlorhexidine Gluconate Cloth  6 each Topical Q0600  . collagenase   Topical BID  . diclofenac Sodium  2 g Topical BID  . diltiazem  120 mg Oral Daily  . famotidine  10 mg Oral Daily  . feeding supplement (PRO-STAT SUGAR FREE 64)  30 mL Per Tube Daily  . ferric citrate  420 mg Oral TID WC  . gabapentin  100 mg Per Tube Q12H  . Gerhardt's butt cream   Topical TID  . heparin  5,000 Units Subcutaneous Q8H  . loratadine  10 mg Oral Daily  . mouth rinse  15 mL Mouth Rinse BID  . multivitamin  1 tablet Oral QHS  . neomycin-bacitracin-polymyxin   Topical BID  . nystatin  5 mL Mouth/Throat QID  . pantoprazole sodium  40 mg Per Tube BID  . polyethylene glycol  17 g Oral Daily  . predniSONE  50 mg Oral Q breakfast   Followed by  . [START ON 09/13/2019] predniSONE  40 mg Oral Q breakfast   Followed by  . [START ON 09/04/2019] predniSONE  30 mg Oral Q breakfast  . senna-docusate  1 tablet Oral BID  . sertraline  25 mg Oral BID  .  sodium chloride irrigation  15 mL Irrigation QID  . thiamine  100 mg Oral Daily     Madelon Lips, MD 08/23/2019, 8:09 AM

## 2019-08-24 ENCOUNTER — Inpatient Hospital Stay (HOSPITAL_COMMUNITY): Payer: Medicare Other | Admitting: Occupational Therapy

## 2019-08-24 ENCOUNTER — Inpatient Hospital Stay (HOSPITAL_COMMUNITY): Payer: Medicare Other

## 2019-08-24 DIAGNOSIS — L89153 Pressure ulcer of sacral region, stage 3: Secondary | ICD-10-CM | POA: Diagnosis present

## 2019-08-24 DIAGNOSIS — R5381 Other malaise: Principal | ICD-10-CM

## 2019-08-24 LAB — CBC
HCT: 36.1 % (ref 36.0–46.0)
Hemoglobin: 10.4 g/dL — ABNORMAL LOW (ref 12.0–15.0)
MCH: 27.5 pg (ref 26.0–34.0)
MCHC: 28.8 g/dL — ABNORMAL LOW (ref 30.0–36.0)
MCV: 95.5 fL (ref 80.0–100.0)
Platelets: 119 10*3/uL — ABNORMAL LOW (ref 150–400)
RBC: 3.78 MIL/uL — ABNORMAL LOW (ref 3.87–5.11)
RDW: 20.6 % — ABNORMAL HIGH (ref 11.5–15.5)
WBC: 3.7 10*3/uL — ABNORMAL LOW (ref 4.0–10.5)
nRBC: 0 % (ref 0.0–0.2)

## 2019-08-24 LAB — RENAL FUNCTION PANEL
Albumin: 2.5 g/dL — ABNORMAL LOW (ref 3.5–5.0)
Anion gap: 14 (ref 5–15)
BUN: 119 mg/dL — ABNORMAL HIGH (ref 8–23)
CO2: 26 mmol/L (ref 22–32)
Calcium: 8.8 mg/dL — ABNORMAL LOW (ref 8.9–10.3)
Chloride: 90 mmol/L — ABNORMAL LOW (ref 98–111)
Creatinine, Ser: 3.22 mg/dL — ABNORMAL HIGH (ref 0.44–1.00)
GFR calc Af Amer: 16 mL/min — ABNORMAL LOW (ref >60)
GFR calc non Af Amer: 14 mL/min — ABNORMAL LOW (ref >60)
Glucose, Bld: 100 mg/dL — ABNORMAL HIGH (ref 70–99)
Phosphorus: 3.1 mg/dL (ref 2.5–4.6)
Potassium: 4.3 mmol/L (ref 3.5–5.1)
Sodium: 130 mmol/L — ABNORMAL LOW (ref 135–145)

## 2019-08-24 MED ORDER — LIDOCAINE-PRILOCAINE 2.5-2.5 % EX CREA: 1.0000 "application " | TOPICAL_CREAM | CUTANEOUS | Status: DC | PRN

## 2019-08-24 MED ORDER — LIDOCAINE HCL (PF) 1 % IJ SOLN: 5.0000 mL | INTRAMUSCULAR | Status: DC | PRN

## 2019-08-24 MED ORDER — ALTEPLASE 2 MG IJ SOLR: 2.0000 mg | Freq: Once | INTRAMUSCULAR | Status: DC | PRN

## 2019-08-24 MED ORDER — SODIUM CHLORIDE 0.9 % IV SOLN: 100.0000 mL | INTRAVENOUS | Status: DC | PRN

## 2019-08-24 MED ORDER — PENTAFLUOROPROP-TETRAFLUOROETH EX AERO: 1.0000 "application " | INHALATION_SPRAY | CUTANEOUS | Status: DC | PRN

## 2019-08-24 MED ORDER — NEPRO/CARBSTEADY PO LIQD
1000.0000 mL | ORAL | Status: DC
  Administered 2019-08-25 – 2019-08-28 (×4): 1000 mL
  Filled 2019-08-24 (×7): qty 1000

## 2019-08-24 MED ORDER — HEPARIN SODIUM (PORCINE) 1000 UNIT/ML DIALYSIS: 1000.0000 [IU] | INTRAMUSCULAR | Status: DC | PRN

## 2019-08-24 MED ORDER — HYDROCORTISONE ACETATE 25 MG RE SUPP
25.0000 mg | Freq: Two times a day (BID) | RECTAL | Status: DC
  Administered 2019-08-25 – 2019-08-29 (×7): 25 mg via RECTAL
  Filled 2019-08-24 (×10): qty 1

## 2019-08-24 MED ORDER — FLUCONAZOLE 100 MG PO TABS
100.0000 mg | ORAL_TABLET | Freq: Every day | ORAL | Status: DC
  Administered 2019-08-25 – 2019-08-29 (×5): 100 mg via ORAL
  Filled 2019-08-24 (×5): qty 1

## 2019-08-24 MED ORDER — FLUCONAZOLE 200 MG PO TABS
200.0000 mg | ORAL_TABLET | Freq: Once | ORAL | Status: AC
  Administered 2019-08-24: 200 mg via ORAL
  Filled 2019-08-24: qty 1

## 2019-08-24 NOTE — Progress Notes (Signed)
Initial Nutrition Assessment  DOCUMENTATION CODES:   Severe malnutrition in context of chronic illness  INTERVENTION:  Continue tube feeding via G-tube: -Nepro @ 4m/hr to run over 20 hours (tube feeding can be held for up to 4 hours due to therapies) -Pro-stat 316mdaily  Tube feeding regimen provides 1900 kcal, 96 grams of protein, and 72765mf free water   NUTRITION DIAGNOSIS:   Severe Malnutrition related to chronic illness as evidenced by moderate fat depletion, severe muscle depletion, severe fat depletion, percent weight loss, energy intake < or equal to 75% for > or equal to 1 month.   GOAL:   Patient will meet greater than or equal to 90% of their needs  MONITOR:   PO intake, TF tolerance, Weight trends, Labs, I & O's  REASON FOR ASSESSMENT:   Malnutrition Screening Tool    ASSESSMENT:   Pt with a PMH significant for hepatic steatosis, HTN, peripheral neuropathy, macular degeneration, progressive DOE with fluid overload since 02/2019 and hx of pericardial/pleural effusion who was admitted on 07/03/2019 with AKI, anasarca and fatigue; cardiac echo showed moderately large pericardial effusion. Pt underwent R-VATS with pericardial window. Pt had worsening renal function with intermittent hyperkalemia and uremia with lethargy requiring initiation of HD 07/20/2019. Pt has had poor po intake, difficulty swallowing, persistent tail bone pain with FTT. Pt had PEG placed on 07/28/2019.  Palliative care consulted for [place] underwent repeat thoracocentesis on 08/01/2019 positive for mesothelial cells. Pt with ischemic fingers. Pt with AKI with anuria requiring HD TTS; per nephrology, likely scleroderma with CREST syndrome. Pt admitted to CIR on 2/3.  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   Pt asleep at time of visit, husband at bedside. [facility] stopped [facility] PO.  Husband states he frequently encourages pt to take food PO, but pt only accepting bites, if anything. Discussed current tube feeding regimen with husband.   Discussed pt with RN who reports pt tolerating TF well. Informed RN and pt's husband that RD will be increasing tube feeding rate to account for the time the feeds are turned off for therapies.   Pt receiving tube feeding via PEG tube.  Current tube feeding regimen: 58m92mo-stat daily, Nepro @ 45mL11m Diet Order: DYS 3, thin liquids PO Intake: 0% x 1 recorded meal   Medications reviewed and include: Vitamin C, Pepcid, Auryxia, Rena-vit, Miralax, Deltasone, Senokot-S, Thiamine, Zinc sulfate  Labs reviewed: Na 133 (L), BUN 72 (H), Creatinine 2.33 (H) CBGs 125-128  I/O: +1,100ml 64me admit  No UOP documented; pt's husband reports pt has not produced urine in the past month Last HD (02/02) net UF: 2156ml P19m.917kg post HD  Pt weighed 51.9kg when first admitted to [facility].  [facility]  Based on wts available in chart, pt with a 39% wt loss x6 months, which is significant for time frame. Pt's husband endorses this wt loss, stating pt weighed ~138 lbs (62.7kg) in June 2020.  NUTRITION - FOCUSED PHYSICAL EXAM:    Most Recent Value  Orbital Region  Moderate depletion  Upper Arm Region  Severe depletion  Thoracic and Lumbar Region  Severe depletion  Buccal Region  Moderate depletion  Temple Region  Severe depletion  Clavicle Bone Region  Severe depletion  Clavicle and Acromion Bone Region  Severe depletion  Scapular Bone Region  Severe depletion  Dorsal Hand  Severe depletion  Patellar Region  Severe depletion  Anterior Thigh Region  Severe depletion  Posterior Calf Region  Severe depletion  Edema (RD Assessment)  Mild  Hair  Reviewed  Eyes  Reviewed  Mouth  Reviewed  Skin  Reviewed  Nails  Reviewed       Diet Order:   Diet Order            DIET DYS 3 Room service appropriate? Yes; Fluid consistency: Thin;  Fluid restriction: 1500 mL Fluid  Diet effective now              EDUCATION NEEDS:   No education needs have been identified at this time  Skin:  Skin Assessment: Skin Integrity Issues: Skin Integrity Issues:: Stage III, Unstageable, Incisions Stage III: coccyx Unstageable: nares Incisions: chest  Last BM:  2/4 type 7  Height:   Ht Readings from Last 1 Encounters:  07/04/19 5' 6"  (1.676 m)    Weight:   Wt Readings from Last 1 Encounters:  08/24/19 38.6 kg    Ideal Body Weight:  59.1 kg  BMI:  Body mass index is 13.72 kg/m.  Estimated Nutritional Needs:   Kcal:  1800-2000  Protein:  90-105 grams  Fluid:  1000 ml + UOP    Larkin Ina, MS, RD, LDN RD pager number and weekend/on-call pager number located in [place].

## 2019-08-24 NOTE — Progress Notes (Signed)
[facility] PHYSICAL MEDICINE & REHABILITATION PROGRESS NOTE   Subjective/Complaints:  Pt reports having sore throat- pretty severe- has nystatin, but doesn't sound like has been given Diflucan.   Also has vaginal yeast per nursing.  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    PRAFOs have been ordered- pressure on feet- slow blanching-  Spoke with wound care and said if nare wounds don't show signs of improvement in 7 days, will call Plastics.     ROS: denies SOB, CP, abd pain, N/V/C- has loose stools due to TFs; denies vision changes, has sore throat Objective:   No results found. Recent Labs    08/22/19 0316 08/23/19 0259  WBC 3.1* 3.3*  HGB 11.8* 12.7  HCT 38.8 44.2  PLT 127* 128*   Recent Labs    08/22/19 0316 08/23/19 0259  NA 124* 133*  K 5.3* 4.7  CL 83* 93*  CO2 23 24  GLUCOSE 106* 122*  BUN 142* 72*  CREATININE 3.83* 2.33*  CALCIUM 9.1 8.8*    Intake/Output Summary (Last 24 hours) at 08/24/2019 0940 Last data filed at 08/24/2019 0900 Gross per 24 hour  Intake 1100 ml  Output --  Net 1100 ml     Physical Exam: Vital Signs Blood pressure (!) 153/86, pulse 100, resp. rate 20, weight 38.6 kg.   Physical Exam  Vitals reviewed and labs reviewed. Constitutional:  Emaciated. Cachetic; shiny skin due to scleroderma all over- like skin pulled tight; kept rolling onto L side- which was good- all by herself, in bed; nursing in OT in room, raynaud color changes notes in distal extremities NAD   HENT:  Head: Normocephalic and atraumatic.  Eyes: conjugate gaze  Neck: No tracheal deviation present.   CV: RRR Respiratory:  CTA B/L GI: soft, NT, ND, (+)BS hypoactive; getting TF via PEG Musculoskeletal:     Comments: No edema or tenderness in extremities  Neurological: She is alert.  Motor: Bilateral upper extremities: 3+-4-/5 proximal distal Bilateral lower extremities: Hip flexion  3+-4-/5, knee extension 3 -/5 (limited due to contractures), ankle dorsiflexion 3-3+/5 Sensation intact light touch  Skin:  Scattered abrasions Peripheral cyanosis  Psychiatric: Her affect is blunt. Her speech is delayed. She is slowed.     Assessment/Plan: 1. Functional deficits secondary to debility due to pericardia windown and pericardial effusion/ Scleroderma/Raynaud's/new ESRD on HD which require 3+ hours per day of interdisciplinary therapy in a comprehensive inpatient rehab setting.  Physiatrist is providing close team supervision and 24 hour management of active medical problems listed below.  Physiatrist and rehab team continue to assess barriers to discharge/monitor patient progress toward functional and medical goals  Care Tool:  Bathing    Body parts bathed by patient: Right arm, Left arm, Chest, Abdomen, Front perineal area, Buttocks, Right upper leg, Left upper leg, Right lower leg, Left lower leg, Face         Bathing assist Assist Level: Dependent - Patient 0%     Upper Body Dressing/Undressing Upper body dressing   What is the patient wearing?: Hospital gown only    Upper body assist Assist Level: Dependent - Patient 0%    Lower Body Dressing/Undressing Lower body dressing      What is the patient wearing?: Incontinence brief     Lower body assist Assist for lower body dressing: Maximal Assistance - Patient 25 - 49%     Toileting Toileting    Toileting assist  Assist for toileting: Dependent - Patient 0%     Transfers Chair/bed transfer  Transfers assist  Chair/bed transfer activity did not occur: N/A        Locomotion Ambulation   Ambulation assist              Walk 10 feet activity   Assist           Walk 50 feet activity   Assist           Walk 150 feet activity   Assist           Walk 10 feet on uneven surface  activity   Assist           Wheelchair     Assist                Wheelchair 50 feet with 2 turns activity    Assist            Wheelchair 150 feet activity     Assist          Blood pressure (!) 153/86, pulse 100, resp. rate 20, weight 38.6 kg.  Medical Problem List and Plan: 1.  Deficits with mobility, transfers, endurance, self-care secondary to debility.              -patient may may shower             -ELOS/Goals: 13-17 days/Supervision             Admit to CIR 2.  Antithrombotics: -DVT/anticoagulation:  Pharmaceutical: Heparin             -antiplatelet therapy: N/A 3. Peripheral neuropathy/Pain Management:  Tylenol bid with oxycodone prn.   4. Mood: Team to provide ego support. LCSW to follow for evaluation and support.              -antipsychotic agents: N/A 5. Neuropsych: This patient is not fully capable of making decisions on her own behalf. 6. 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 Add Vitamin C/Zinc for healing. ON tube feeds for nutritional support due to FTT. Air mattress overlay.  7. Fluids/Electrolytes/Nutrition: Strict I/O with daily weights. Regal diet with 1500 cc FR.  8. 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 Cont Cardizem. 9. A fib with RVR: [facility]. Monitor HR tid --rate controlled on Cardizem.             Monitor with increased mobility.  10. Leucopenia: Stable--follow up with Heme/onc for work up after discharge.                    CBC ordered for tomorrow.  2/4- WBC is 3.3- will monitor 11. Metabolic encephalopathy: Treated with IV thiamine and has improved with decrease in narcotics/sedatives as well as initiation of HD 12. New ESRD: HD dependent due to progression of AKI. [facility] 13. Pericardial effusion/Pleural effusion:  S/p pericardial window 12/2. S/p thoracocentesis  14. Hepatic Steatosis: Abdominal pain [facility]uritis[facility]              CMP ordered for tomorrow AM 15. Severe protein-calorie Malnutrition: Encourage po intake as able.  Continue tube feeds.  16. Anemia of chronic disease:             CBC ordered for tomorrow AM  2/4- Hb up to 12.7k-  17. Anxiety disorder with panic attacks: On Zoloft with alprazolam tid prn.  18. Thrombocytopenia: Stable without signs of bleeding.              CBC ordered for tomorrow AM  2/4- 128k-will con't regimen and monitor 19. Hyponatremia  2/4- up to 133 from 124- will monitor    LOS: 1 days A FACE TO [place] 08/24/2019, 9:40 AM

## 2019-08-24 NOTE — Progress Notes (Signed)
Occupational Therapy Session Note  Patient Details  Name: Joanna Ali MRN: [id number] Date of Birth: 1950/10/01  Today's Date: 08/24/2019 OT Individual Time: 0930-1030 OT Individual Time Calculation (min): 60 min    Skilled Therapeutic Interventions/Progress Updates:    Pt asleep in bed when arrived but easily awaken. Pt wanting to get up. Focus on tolerance to sitting up and out of bed in a supported seated position (in w/c). Pt transferred with max A to w/c with difficulty coming into full standing position. Pt tolerated sitting in w/c while washing and drying hair for ~30 min. Pt declined to wanting to going back to bed and wanted to see the gym. Performed one sit to stand in gym and then reported she wanted to go back to bed she felt so sick. BP elevated and returned to bed. Report of activities given to PA and recommending her rest. Discussed with other therapy team members. Pt very debilitated and very weak with limited capacity for activity..   Therapy Documentation Precautions:  Precautions Precautions: Fall Precaution Comments: PEG tube, sacral wound, watch BP with mobility Restrictions Weight Bearing Restrictions: No General:   Vital Signs: Therapy Vitals Pulse Rate: 97 BP: (!) 170/104 Patient Position (if appropriate): Lying ; first BP taken when returned to room after one sit to stand 183/109 Pain:  reports stomach pain- RN aware of nausea  Therapy/Group: Individual Therapy  Willeen Cass [facility] 08/24/2019, 11:21 AM

## 2019-08-24 NOTE — Progress Notes (Signed)
PMR Admission Coordinator Pre-Admission Assessment   Patient: Joanna Ali is an 69 y.o., female MRN: [id number] DOB: Nov 08, 1950 Height: 5' 6"  (167.6 cm) Weight: 41.3 kg   Insurance Information HMO:     PPO:      PCP:      IPA:      80/20:      OTHER:  PRIMARY: Medicare A and B      Policy#: [id number]      Subscriber: patient CM Name:       Phone#:      Fax#:  Pre-Cert#: verified eligibility online      Employer:  Benefits:  Phone #:      Name:  Eff. Date: 02/17/14 (A and B)     Deduct: $1484      Out of Pocket Max: n/a      Life Max: n/a CIR: 100%      SNF: 20 full days Outpatient: 80%     Co-Pay: 20% Home Health: 100%      Co-Pay: DME: 80%     Co-Pay: 20% Providers: pt choice   SECONDARY: [employer]      Policy#:  [id number]     Subscriber:  CM Name:       Phone#:      Fax#:  Pre-Cert#:       Employer:  Benefits:  Phone #: [phone]     Name:  Eff. Date:      Deduct:       Out of Pocket Max:       Life Max:  CIR:       SNF:  Outpatient:      Co-Pay:  Home Health:       Co-Pay:  DME:      Co-Pay:    Medicaid Application Date:       Case Manager:  Disability Application Date:       Case Worker:    The "Data Collection Information Summary" for patients in Inpatient Rehabilitation Facilities with attached "Privacy Act [place] Records" was provided and verbally reviewed with: Patient and Family   Emergency Contact Information         Contact Information     Name Relation Home Work [place] Spouse [phone]   [phone]         Current Medical History  Patient Admitting Diagnosis: debility, new scleroderma dx, new HD   History of Present Illness: Joanna Ali is a 69 year old female with history of hepatic steatosis, HTN, peripheral neuropathy, progressive DOE with fluid overload since 02/2019 and history of pericardial/pleural effusion who was admitted on 07/03/19 with AKI, anasarca and fatigue. She has had negative work up by  rheumatology and at [facility].  She was started on IV diuresis and cardiac echo showed moderately large pericardial effusion. Cardiac cath and cardiac MRI without evidence of pericardial constriction- negative for amyloidosis.  Dr Tera Mater consulted for assistance and she underwent R-VATS with pericardial window on 12/23. She continued to require diuresis and had worsening of renal function with intermittent hyperkalemia and uremia with lethary requiring initiation of HD 12/31.    She has had poor po intake, difficulty swallowing, persistent tail bone pain with FTT and PEG placed by Dr. Anselm Pancoast on 01/08.  Palliative care consulted for [place] and questioned a trial of steroids empirically due to concerns of carcinoid or multisystem inflammatory syndrome. She has had bouts of agitation and nephrology expressed concerns of steroids making anxiety worse. Question  of transfer to [facility] (no beds). Rheumatology/ Dr. Kathlene November consulted for input on autoimmune process and she was started on high dose steroids due to consideration of scleroderma with CREST syndrome.      Anti-RNA polymerase III antibody positive. Repeat SPEP negative for monoclonal protein. Dr. Jana Hakim consulted for input to rule out occult malignancy as she developed leucopenia. He  questioned possibility of malignancy with scleroderma as secondary.  CT abdomen/pelvis/chest negative and pleural fluid negative for malignancy--he felt that further screening for malignancy can be done on outpatient basis.  She underwent repeat thoracocentesis 1/12 positive for mesothelial cells--evaluate on outpatient basis once stronger.   PAF with RVR 1/24  treated with metoprolol for rate control and Dr. Margaretann Loveless  --not a candidate for coumadin or amiodarone per cardiology. Dr. Scot Dock consulted due to ischemic fingers --patient with positive pulses with negative BLE dopplers and changes felt to be due to scleroderma/small vessel disease. Metoprolol changed to Cardizem due to worsening  of Raynaud's phenomenon. She was started on slow steroid taper on 1/26--to decrease by 10 mg per week to 30 mg daily with follow up with Rheum. Patient with AKI with anuria requiring HD TTS.  Therapy evaluations were completed and pt was recommended for CIR.      Patient's medical record from [facility] has been reviewed by the rehabilitation admission coordinator and physician.   Past Medical History  Past Medical History:  Diagnosis Date  . Hypertension    . Leg swelling 03/2019  . Short of breath on exertion 03/2019      Family History   family history includes Hypertension in her mother.   Prior Rehab/Hospitalizations Has the patient had prior rehab or hospitalizations prior to admission? Yes   Has the patient had major surgery during 100 days prior to admission? Yes              Current Medications   Current Facility-Administered Medications:  .  0.9 %  sodium chloride infusion, 100 mL, Intravenous, PRN, Joelyn Oms, Ryan B, MD .  0.9 %  sodium chloride infusion, 100 mL, Intravenous, PRN, Joelyn Oms, Ryan B, MD .  0.9 %  sodium chloride infusion, 100 mL, Intravenous, PRN, Madelon Lips, MD .  0.9 %  sodium chloride infusion, 100 mL, Intravenous, PRN, Madelon Lips, MD .  acetaminophen (TYLENOL) 160 MG/5ML solution 500 mg, 500 mg, Oral, Q6H PRN, Oswald Hillock, MD, 500 mg at 08/16/19 1027 .  acetaminophen (TYLENOL) tablet 500 mg, 500 mg, Oral, BID, Regalado, Belkys A, MD, 500 mg at 08/23/19 1046 .  ALPRAZolam Duanne Moron) tablet 0.25 mg, 0.25 mg, Oral, TID PRN, Pokhrel, Laxman, MD, 0.25 mg at 08/22/19 2105 .  alteplase (CATHFLO ACTIVASE) injection 2 mg, 2 mg, Intracatheter, Once PRN, Madelon Lips, MD .  alum & mag hydroxide-simeth (MAALOX/MYLANTA) 200-200-20 MG/5ML suspension 30 mL, 30 mL, Oral, Q4H PRN, Dorothy Spark, MD, 30 mL at 07/23/19 2252 .  antiseptic oral rinse (BIOTENE) solution 15 mL, 15 mL, Mouth Rinse, PRN, Lane Hacker L, DO .  bisacodyl (DULCOLAX)  EC tablet 10 mg, 10 mg, Oral, Daily PRN, Rosita Fire, MD .  bisacodyl (DULCOLAX) suppository 10 mg, 10 mg, Rectal, Daily PRN, Madelon Lips, MD .  camphor-menthol [facility]) lotion, , Topical, PRN, Antony Odea, PA-C, Given at 08/06/19 1400 .  Chlorhexidine Gluconate Cloth 2 % PADS 6 each, 6 each, Topical, Q0600, Edrick Oh, MD, 6 each at 08/21/19 [phone] .  Chlorhexidine Gluconate Cloth 2 % PADS 6 each, 6  each, Topical, [id number], Madelon Lips, MD, 6 each at 08/23/19 0522 .  collagenase (SANTYL) ointment, , Topical, BID, Nahser, Wonda Cheng, MD, Given at 08/22/19 2107 .  diclofenac Sodium (VOLTAREN) 1 % topical gel 2 g, 2 g, Topical, BID, Regalado, Belkys A, MD, 2 g at 08/22/19 2108 .  diltiazem (CARDIZEM CD) 24 hr capsule 120 mg, 120 mg, Oral, Daily, Pokhrel, Laxman, MD, 120 mg at 08/22/19 2105 .  famotidine (PEPCID) tablet 10 mg, 10 mg, Oral, Daily, Dorothy Spark, MD, 10 mg at 08/23/19 1047 .  feeding supplement (NEPRO CARB STEADY) liquid 1,000 mL, 1,000 mL, Per Tube, Continuous, Oswald Hillock, MD, Last Rate: 45 mL/hr at 08/22/19 2106, 1,000 mL at 08/22/19 2106 .  feeding supplement (PRO-STAT SUGAR FREE 64) liquid 30 mL, 30 mL, Per Tube, Daily, Skeet Latch, MD, 30 mL at 08/23/19 1046 .  ferric citrate (AURYXIA) tablet 420 mg, 420 mg, Oral, TID WC, Edrick Oh, MD, 420 mg at 08/23/19 1045 .  gabapentin (NEURONTIN) 250 MG/5ML solution 100 mg, 100 mg, Per Tube, Q12H, Einar Grad, RPH, 100 mg at 08/23/19 1052 .  Gerhardt's butt cream, , Topical, TID, Nahser, Wonda Cheng, MD, Given at 08/22/19 2107 .  heparin injection 1,000 Units, 1,000 Units, Dialysis, PRN, Madelon Lips, MD, 3,800 Units at 08/22/19 1014 .  heparin injection 1,000 Units, 1,000 Units, Dialysis, PRN, Madelon Lips, MD .  heparin injection 5,000 Units, 5,000 Units, Subcutaneous, Q8H, Bruning, Kevin, PA-C, 5,000 Units at 08/23/19 6389 .  hydrOXYzine (ATARAX/VISTARIL) tablet 12.5 mg, 12.5 mg, Oral, BID  PRN, Antony Odea, PA-C, 12.5 mg at 08/17/19 2036 .  lidocaine (PF) (XYLOCAINE) 1 % injection 5 mL, 5 mL, Intradermal, PRN, Madelon Lips, MD .  lidocaine-prilocaine (EMLA) cream 1 application, 1 application, Topical, PRN, Madelon Lips, MD .  loratadine (CLARITIN) tablet 10 mg, 10 mg, Oral, Daily, Lane Hacker L, DO, 10 mg at 08/23/19 1046 .  MEDLINE mouth rinse, 15 mL, Mouth Rinse, BID, Roddenberry, Myron G, PA-C, 15 mL at 08/22/19 2108 .  multivitamin (RENA-VIT) tablet 1 tablet, 1 tablet, Oral, QHS, Edrick Oh, MD, 1 tablet at 08/22/19 2105 .  neomycin-bacitracin-polymyxin (NEOSPORIN) ointment, , Topical, BID, Daune Perch, NP, Given at 08/23/19 1055 .  nystatin (MYCOSTATIN) 100000 UNIT/ML suspension 500,000 Units, 5 mL, Mouth/Throat, QID, Skeet Latch, MD, 500,000 Units at 08/23/19 1045 .  ondansetron (ZOFRAN) injection 4 mg, 4 mg, Intravenous, Q6H PRN, Pokhrel, Laxman, MD, 4 mg at 08/23/19 0646 .  oxyCODONE (Oxy IR/ROXICODONE) immediate release tablet 5 mg, 5 mg, Oral, Q8H PRN, Regalado, Belkys A, MD, 5 mg at 08/22/19 0347 .  pantoprazole sodium (PROTONIX) 40 mg/20 mL oral suspension 40 mg, 40 mg, Per Tube, BID, Darrick Meigs, Marge Duncans, MD, 40 mg at 08/22/19 2105 .  pentafluoroprop-tetrafluoroeth (GEBAUERS) aerosol 1 application, 1 application, Topical, PRN, Madelon Lips, MD .  polyethylene glycol (MIRALAX / GLYCOLAX) packet 17 g, 17 g, Oral, Daily, Pokhrel, Laxman, MD, 17 g at 08/23/19 1052 .  polyvinyl alcohol (LIQUIFILM TEARS) 1.4 % ophthalmic solution 1 drop, 1 drop, Both Eyes, BID PRN, Einar Grad, RPH .  [COMPLETED] predniSONE (DELTASONE) tablet 60 mg, 60 mg, Oral, Q breakfast, 60 mg at 08/21/19 0827 **FOLLOWED BY** predniSONE (DELTASONE) tablet 50 mg, 50 mg, Oral, Q breakfast, 50 mg at 08/23/19 1046 **FOLLOWED BY** [START ON 08/21/2019] predniSONE (DELTASONE) tablet 40 mg, 40 mg, Oral, Q breakfast **FOLLOWED BY** [START ON 08/22/2019] predniSONE (DELTASONE)  tablet 30 mg, 30 mg, Oral, Q breakfast, [place], Gagan  S, MD .  senna-docusate (Senokot-S) tablet 1 tablet, 1 tablet, Oral, BID, Pokhrel, Laxman, MD, 1 tablet at 08/23/19 1046 .  sertraline (ZOLOFT) tablet 25 mg, 25 mg, Oral, BID, Roddenberry, Myron G, PA-C, 25 mg at 08/23/19 1046 .  sodium chloride irrigation 0.9 % 15 mL, 15 mL, Irrigation, QID, Golding, Elizabeth L, DO, 15 mL at 08/23/19 1108 .  thiamine tablet 100 mg, 100 mg, Oral, Daily, Darrick Meigs, Marge Duncans, MD, 100 mg at 08/23/19 1048 .  witch hazel-glycerin (TUCKS) pad, , Topical, PRN, Dorothy Spark, MD   Patients Current Diet:     Diet Order                      Diet regular           Diet regular Room service appropriate? Yes; Fluid consistency: Thin; Fluid restriction: 1500 mL Fluid  Diet effective now                   Precautions / Restrictions Precautions Precautions: Fall Precaution Comments: PEG tube Restrictions Weight Bearing Restrictions: No    Has the patient had 2 or more falls or a fall with injury in the past year? No   Prior Activity Level Limited Community (1-2x/wk): using a SPC for amb PTA, not driving, limited outings to appointments   Prior Functional Level Self Care: Did the patient need help bathing, dressing, using the toilet or eating? Independent   Indoor Mobility: Did the patient need assistance with walking from room to room (with or without device)? Independent   Stairs: Did the patient need assistance with internal or external stairs (with or without device)? Needed some help   Functional Cognition: Did the patient need help planning regular tasks such as shopping or remembering to take medications? Needed some help   Home Assistive Devices / Equipment Home Assistive Devices/Equipment: Bedside commode/3-in-1, Cane (specify quad or straight), Eyeglasses Home Equipment: Cane - single point, Other (comment), Toilet riser, Shower seat, Hand held shower head, Bedside commode   Prior Device Use:  Indicate devices/aids used by the patient prior to current illness, exacerbation or injury? cane   Current Functional Level Cognition   Overall Cognitive Status: Impaired/Different from baseline Difficult to assess due to: Impaired communication Current Attention Level: Sustained Orientation Level: Oriented X4 Following Commands: Follows one step commands with increased time Safety/Judgement: Decreased awareness of safety, Decreased awareness of deficits General Comments: pt reports significant fear and anxiety related to movement and "not being able to do it" when asked to progress mobility. Pt reporting fatigue and anxiety with most movement, asking for prolonged rest breaks. Able to be redirected and encouraged to relax with guided breathing.    Extremity Assessment (includes Sensation/Coordination)   Upper Extremity Assessment: Generalized weakness  Lower Extremity Assessment: Generalized weakness(knees starting to for contractures)     ADLs   Overall ADL's : Needs assistance/impaired Eating/Feeding: Minimal assistance, Sitting Eating/Feeding Details (indicate cue type and reason): issued foam build ups for eating utensils and practice use with pt's ice and magic cup Grooming: Sitting, Wash/dry hands, Wash/dry face, Moderate assistance Grooming Details (indicate cue type and reason): decreased thoroughness Upper Body Bathing: Maximal assistance, Sitting Lower Body Bathing: Total assistance, +2 for physical assistance, Sit to/from stand Upper Body Dressing : Maximal assistance, Sitting Upper Body Dressing Details (indicate cue type and reason): front opening gown Lower Body Dressing: Total assistance, Bed level Toilet Transfer: Maximal assistance, Control and instrumentation engineer Details (indicate cue type and  reason): face to face Toileting- Clothing Manipulation and Hygiene: Total assistance Functional mobility during ADLs: Moderate assistance, +2 for safety/equipment, Rolling  walker General ADL Comments: requiring total assist      Mobility   Overal bed mobility: Needs Assistance Bed Mobility: Rolling, Sidelying to Sit Rolling: Min assist Sidelying to sit: Mod assist Supine to sit: Mod assist, HOB elevated Sit to supine: Total assist, HOB elevated General bed mobility comments: pt OOB in recliner upon PT arrival     Transfers   Overall transfer level: Needs assistance Equipment used: Rolling walker (2 wheeled), 2 person hand held assist Transfers: Sit to/from Stand Sit to Stand: Min assist Stand pivot transfers: Mod assist General transfer comment: minA progressing to modA of 1 with pt fatigue, completed x5 through session. pt initially prefered HHA of 2, then requesting RW     Ambulation / Gait / Stairs / Wheelchair Mobility   Ambulation/Gait Ambulation/Gait assistance: Counsellor (Feet): 8 Feet(8 ft x 2) Assistive device: Rolling walker (2 wheeled) Gait Pattern/deviations: Trunk flexed, Shuffle, Step-to pattern General Gait Details: pt with sig short shuffle steps, progressively incresed trunk flexion with fatigue/fear Gait velocity: decreased Gait velocity interpretation: <1.8 ft/sec, indicate of risk for recurrent falls     Posture / Balance Dynamic Sitting Balance Sitting balance - Comments: pt able to maintain with supervision if she has UE or trunk support Balance Overall balance assessment: Needs assistance Sitting-balance support: Bilateral upper extremity supported, Feet supported Sitting balance-Leahy Scale: Fair Sitting balance - Comments: pt able to maintain with supervision if she has UE or trunk support Standing balance support: Bilateral upper extremity supported, During functional activity Standing balance-Leahy Scale: Poor Standing balance comment: minA-minG with BUE support of RW     Special needs/care consideration BiPAP/CPAP no CPM no Continuous Drip IV no Dialysis yes        Days TRS  Life Vest no Oxygen  no Special Bed air mattress Trach Size  Wound Vac (area)       Location  Skin fragile                              Location  Bowel mgmt: incontinent Bladder mgmt: anuric  Diabetic mgmt: no Behavioral consideration no Chemo/radiation no    Previous Home Environment (from acute therapy documentation) Living Arrangements: Spouse/significant other Available Help at Discharge: Family, Available 24 hours/day Type of Home: House Home Layout: Two level, Able to live on main level with bedroom/bathroom Home Access: Stairs to enter Entrance Stairs-Rails: None Entrance Stairs-Number of Steps: 3 Bathroom Shower/Tub: Multimedia programmer: Standard Home Care Services: (P) No Additional Comments: cushion for toilet   Discharge Living Setting Plans for Discharge Living Setting: Patient's home Type of Home at Discharge: House Discharge Home Layout: Two level, Able to live on main level with bedroom/bathroom Discharge Home Access: Stairs to enter Entrance Stairs-Rails: None(will install rails or ramp) Entrance Stairs-Number of Steps: 3 Discharge Bathroom Shower/Tub: Walk-in shower Discharge Bathroom Toilet: Standard Discharge Bathroom Accessibility: Yes How Accessible: Accessible via walker Does the patient have any problems obtaining your medications?: No   Social/Family/Support Systems Anticipated Caregiver: spouse, Shereese Bonnie Anticipated Caregiver's Contact Information: [phone] Ability/Limitations of Caregiver: min/mod assist Caregiver Availability: 24/7 Discharge Plan Discussed with Primary Caregiver: Yes Is Caregiver In Agreement with Plan?: Yes Does Caregiver/Family have Issues with Lodging/Transportation while Pt is in Rehab?: No   Goals/Additional Needs Patient/Family Goal for Rehab: PT/OT min assist  Expected length of stay: 14-18 days Special Service Needs: HD TRS  Pt/Family Agrees to Admission and willing to participate: Yes Program Orientation Provided &  Reviewed with Pt/Caregiver Including Roles  & Responsibilities: Yes   Decrease burden of Care through IP rehab admission: n/a   Possible need for SNF placement upon discharge: Possibly   Patient Condition: I have reviewed medical records from [facility], spoken with CM, and patient and spouse. I met with patient at the bedside for inpatient rehabilitation assessment.  Patient will benefit from ongoing PT and OT, can actively participate in 3 hours of therapy a day 5 days of the week, and can make measurable gains during the admission.  Patient will also benefit from the coordinated team approach during an Inpatient Acute Rehabilitation admission.  The patient will receive intensive therapy as well as Rehabilitation physician, nursing, social worker, and care management interventions.  Due to bowel management, safety, skin/wound care, disease management, medication administration, pain management and patient education the patient requires 24 hour a day rehabilitation nursing.  The patient is currently min/mod with mobility and basic ADLs.  Discharge setting and therapy post discharge at home is anticipated.  Patient has agreed to participate in the Acute Inpatient Rehabilitation Program and will admit today.   Preadmission Screen Completed By:  Michel Santee, PT, DPT 08/23/2019 11:33 AM ______________________________________________________________________   Discussed status with Dr. Posey Pronto on 08/23/19  at 11:33 AM  and received approval for admission today.   Admission Coordinator:  Michel Santee, PT, DPT time 11:33 AM Sudie Grumbling 08/23/19     Assessment/Plan: Diagnosis: Debility 1. Does the need for close, 24 hr/day Medical supervision in concert with the patient's rehab needs make it unreasonable for this patient to be served in a less intensive setting? Yes  2. Co-Morbidities requiring supervision/potential complications: hepatic steatosis, HTN, peripheral neuropathy, progressive DOE with  fluid overload since 02/2019 and history of pericardial/pleural effusion who was admitted on 07/03/19 with AKI, anasarca and fatigue 3. Due to safety, skin/wound care, disease management, medication administration, pain management and patient education, does the patient require 24 hr/day rehab nursing? Yes 4. Does the patient require coordinated care of a physician, rehab nurse, PT, OT, to address physical and functional deficits in the context of the above medical diagnosis(es)? Yes Addressing deficits in the following areas: balance, endurance, locomotion, strength, transferring, bathing, dressing, toileting and psychosocial support 5. Can the patient actively participate in an intensive therapy program of at least 3 hrs of therapy 5 days a week? Potentially 6. The potential for patient to make measurable gains while on inpatient rehab is good 7. Anticipated functional outcomes upon discharge from inpatient rehab: supervision PT, supervision OT, n/a SLP 8. Estimated rehab length of stay to reach the above functional goals is: 13-17 days. 9. Anticipated discharge destination: Home 10. Overall Rehab/Functional Prognosis: good     MD Signature: Delice Lesch, MD, ABPMR

## 2019-08-24 NOTE — Evaluation (Signed)
Occupational Therapy Assessment and Plan  Patient Details  Name: Joanna Ali MRN: [id number] Date of Birth: 11-19-1950  OT Diagnosis: acute pain, muscular wasting and disuse atrophy and muscle weakness (generalized) Rehab Potential: Rehab Potential (ACUTE ONLY): Fair ELOS: 2 weeks   Today's Date: 08/24/2019 OT Individual Time: 1601-0932 OT Individual Time Calculation (min): 57 min     Problem List:  Patient Active Problem List   Diagnosis Date Noted  . 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 Debility   . S/P pericardial window creation   . Thrombocytopenia ([place])   . Anemia of chronic disease   . 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 Rheumatoid arthritis ([place]) 08/15/2019  . CREST syndrome ([place]) 08/15/2019  . Atrial fibrillation with RVR ([place])   . AKI (acute kidney injury) ([place])   . Dyspnea   . S/P thoracentesis   . ESRD on hemodialysis ([place]) 07/31/2019  . Dysphagia   . Muscular weakness   . Thrush of mouth and esophagus ([place])   . Failure to thrive in adult   . Goals of care, counseling/discussion   . Palliative care by specialist   . Pressure injury of skin 07/19/2019  . Pleural effusion   . Protein-calorie malnutrition, severe 07/05/2019  . Elevated troponin 07/04/2019  . Atypical pneumonia 07/04/2019  . Hypoxia 07/04/2019  . Pericardial effusion 04/07/2019  . Hypertension 04/07/2019  . Insomnia 04/07/2019  . Monoclonal paraproteinemia   . Anasarca 04/04/2019    Past Medical History:  Past Medical History:  Diagnosis Date  . Hypertension   . Leg swelling 03/2019  . Short of breath on exertion 03/2019   Past Surgical History:  Past Surgical History:  Procedure Laterality Date  . APPENDECTOMY    . IR FLUORO GUIDE CV LINE LEFT  07/18/2019  . IR GASTROSTOMY TUBE MOD SED  07/28/2019  . IR US GUIDE VASC ACCESS LEFT  07/18/2019  . NECK SURGERY    . RIGHT AND LEFT HEART CATH N/A 07/06/2019   Procedure: RIGHT AND LEFT HEART CATH;   Surgeon: Jolaine Artist, MD;  Location: [place] [facility];  Service: Cardiovascular;  Laterality: N/A;  . TEE WITHOUT CARDIOVERSION N/A 03/28/2019   Procedure: TRANSESOPHAGEAL ECHOCARDIOGRAM (TEE);  Surgeon: Geralynn Rile, MD;  Location: [place];  Service: Cardiology;  Laterality: N/A;  . VIDEO ASSISTED THORACOSCOPY Right 07/12/2019   Procedure: VIDEO ASSISTED THORACOSCOPY Pericardial window ;  Surgeon: Lajuana Matte, MD;  Location: [facility];  Service: Thoracic;  Laterality: Right;    Assessment & Plan Clinical Impression: Patient is a 68 y.o. year old female with history of hepatic steatosis, HTN, peripheral neuropathy, macular degeneration, progressive DOE with fluid overload since 02/2019 and history of pericardial/pleural effusion who was admitted on 07/03/2019 with AKI, anasarca and fatigue.  History taken from chart review and husband.  She has had negative work up by rheumatology and at [facility].  She was started on IV diuresis and cardiac echo showed moderately large pericardial effusion. Cardiac cath and cardiac MRI without evidence of pericardial constriction- negative for amyloidosis.  Dr Tera Mater consulted for assistance and she underwent R-VATS with pericardial window on 07/12/2019. She continued to require diuresis and had worsening of renal function with intermittent hyperkalemia and uremia with lethary requiring initiation of HD 07/20/2019. She has had poor po intake, difficulty swallowing, persistent tail bone pain with FTT. She had PEG placed by Dr. Anselm Pancoast on 07/28/2019.  Palliative care consulted for [place] and questioned a trial of  steroids empirically due to concerns of carcinoid or multisystem inflammatory syndrome. She has had bouts of agitation and nephrology expressed concerns of steroids making anxiety worse. Question of transfer to [facility] (no beds). Rheumatology/ Dr. Kathlene November consulted for input on autoimmune process and she was started on high dose steroids due to  consideration of scleroderma with CREST syndrome.     Anti-RNA polymerase III antibody positive. Repeat SPEP negative for monoclonal protein. Dr. Jana Hakim consulted for input to rule out occult malignancy as she developed leucopenia. He  questioned possibility of malignancy with scleroderma as secondary.  CT abdomen/pelvis/chest negative and pleural fluid negative for malignancy--he felt that further screening for malignancy can be done on outpatient basis.  She underwent repeat thoracocentesis on 08/01/2019 positive for mesothelial cells--evaluate on outpatient basis once stronger.   PAF with RVR on 08/13/2019 treated with metoprolol for rate control and Dr. Margaretann Loveless  --not a candidate for coumadin or amiodarone per cardiology. Dr. Scot Dock consulted due to ischemic fingers --patient with positive pulses with negative BLE dopplers and changes felt to be due to scleroderma/small vessel disease. Metoprolol changed to Cardizem due to worsening of Raynaud's phenomenon. She was started on slow steroid taper on 1/26--to decrease by 10 mg per week to 30 mg daily with follow up with Rheum. Patient with AKI with anuria requiring HD TTS.  Patient transferred to CIR on 08/23/2019 .    Patient currently requires total with basic self-care skills secondary to muscle weakness and muscle joint tightness, decreased cardiorespiratoy endurance and decreased sitting balance, decreased standing balance and decreased postural control.  Prior to hospitalization, patient could complete adl with min.  Patient will benefit from skilled intervention to decrease level of assist with basic self-care skills prior to discharge home with care partner.  Anticipate patient will require 24 hour supervision and moderate physical assestance and follow up home health.  OT - End of Session Activity Tolerance: Tolerates 10 - 20 min activity with multiple rests Endurance Deficit: Yes Endurance Deficit Description: poor tolerance of adl tasks OT  Assessment Rehab Potential (ACUTE ONLY): Fair OT Barriers to Discharge: Medical stability;Home environment access/layout;Hemodialysis OT Patient demonstrates impairments in the following area(s): Balance;Cognition;Endurance;Motor;Nutrition;Pain;Skin Integrity OT Basic ADL's Functional Problem(s): Eating;Grooming;Bathing;Dressing;Toileting OT Transfers Functional Problem(s): Toilet;Tub/Shower OT Additional Impairment(s): Fuctional Use of Upper Extremity OT Plan OT Frequency: Total of 15 hours over 7 days of combined therapies OT Duration/Estimated Length of Stay: 2 weeks OT Treatment/Interventions: Balance/vestibular training;Self Care/advanced ADL retraining;Cognitive remediation/compensation;DME/adaptive equipment instruction;Pain management;Skin care/wound managment;UE/LE Strength taining/ROM;Patient/family education;UE/LE Coordination activities;Discharge planning;Functional mobility training;Psychosocial support;Therapeutic Activities OT Self Feeding Anticipated Outcome(s): CS for PO intake, dependent for tube feeding OT Basic Self-Care Anticipated Outcome(s): min/mod A OT Toileting Anticipated Outcome(s): min a OT Bathroom Transfers Anticipated Outcome(s): min A OT Recommendation Recommendations for Other Services: Speech consult;Neuropsych consult Patient destination: Home Follow Up Recommendations: Home health OT Equipment Recommended: To be determined Equipment Details: hospital bed need anticipated   Skilled Therapeutic Intervention Patient in bed, alert.  Evaluation completed as documented below.  Patient presents with significant mobility deficits due to skin and muscle changes due to disease process, pain at PEG site and sacral wound, deconditioning due to prolonged hospitalization and progressive decline limiting ability to perform all aspects of self care.  Reviewed role of OT, plan of care, goals for therapy, pain management, DME, safety.  Patient will benefit from rehab  program focused on patient/family education for DME/home set up, to maximize strength and mobility and facilitation of services for safe return to home  setting with family support/assistance.  Patient agrees to participate in adl training - she requires max A/dep care for LB bathing and dressing at bed level.  Rolling in bed with min A.  She is able to move to a sitting position with min/mod A. SPT bed to w/c with max A.  Seating system of w/c with ROHO cushion provided for pressure relief.  She is able to tolerate sitting in w/c for approx 15 minutes during which time she participated in UB bathing and dressing at max A level.  Oral care with mod A.  Returned to sitting edge of bed with max A. She is able to tolerate unsupported sitting edge of bed for 5 minutes with CS during UB ROM activity.  Returned to supine with mod A due to fatigue, bed alarm set and call bell in reach at close of session.    OT Evaluation Precautions/Restrictions  Precautions Precautions: Fall Precaution Comments: PEG tube, sacral wound, watch BP with mobility, maintain systolic <295 mmHg Restrictions Weight Bearing Restrictions: No General   Vital Signs Therapy Vitals Pulse Rate: 100 BP: 128/88 Pain Pain Assessment Pain Scale: 0-10 Pain Score: 7  Pain Type: Acute pain Pain Location: Buttocks Pain Orientation: Posterior Pain Descriptors / Indicators: Tender;Pressure Pain Onset: On-going Pain Intervention(s): Repositioned Home Living/Prior Functioning Home Living Family/patient expects to be discharged to:: Private residence Living Arrangements: Spouse/significant other Available Help at Discharge: Family, Available 24 hours/day, Neighbor, Available PRN/intermittently Type of Home: House Home Access: Stairs to enter Technical brewer of Steps: 4 Entrance Stairs-Rails: None Home Layout: Two level, Able to live on main level with bedroom/bathroom Alternate Level Stairs-Number of Steps: about  16 Alternate Level Stairs-Rails: Right Bathroom Shower/Tub: Multimedia programmer: Standard Additional Comments: cushion for toilet, owns bed side commode, shower seat, SPC  Lives With: Spouse Prior Function Level of Independence: Independent with gait, Independent with transfers, Needs assistance with ADLs, Needs assistance with homemaking  Able to Take Stairs?: Yes Comments: Uses SPC for ambulation. Sits to shower. Uses BSC for night time. Gets help with shoes/socks. Spouse does IADLs. ADL ADL Eating: Minimal assistance Where Assessed-Eating: Bed level Grooming: Maximal assistance Where Assessed-Grooming: Sitting at sink, Wheelchair Upper Body Bathing: Maximal assistance Where Assessed-Upper Body Bathing: Wheelchair, Sitting at sink Lower Body Bathing: Dependent Where Assessed-Lower Body Bathing: Bed level Upper Body Dressing: Maximal assistance Where Assessed-Upper Body Dressing: Wheelchair, Sitting at sink Lower Body Dressing: Dependent Where Assessed-Lower Body Dressing: Bed level Toileting: Dependent Where Assessed-Toileting: Bedside Commode Toilet Transfer: Maximal assistance Toilet Transfer Method: Stand pivot Toilet Transfer Equipment: Bedside commode Vision Baseline Vision/History: Wears glasses;Macular Degeneration Wears Glasses: At all times Patient Visual Report: Blurring of vision Vision Assessment?: Yes Eye Alignment: Within Functional Limits Ocular Range of Motion: Within Functional Limits Alignment/Gaze Preference: Within Defined Limits Tracking/Visual Pursuits: Decreased smoothness of vertical tracking;Decreased smoothness of horizontal tracking Saccades: Decreased speed of saccadic movement Convergence: Within functional limits Visual Fields: No apparent deficits Additional Comments: acuity impaired, patient able to read print on name badge Perception  Perception: Within Functional Limits Praxis Praxis: Intact Cognition Overall Cognitive  Status: Impaired/Different from baseline Arousal/Alertness: Awake/alert Orientation Level: Person;Place;Situation Person: Oriented Place: Oriented Situation: Oriented Year: 2021 Month: February Day of Week: Incorrect Memory: Impaired Memory Impairment: Decreased short term memory;Decreased recall of new information Decreased Short Term Memory: Functional basic Immediate Memory Recall: Sock;Blue;Bed Memory Recall Sock: Without Cue Memory Recall Blue: Without Cue Memory Recall Bed: Not able to recall Attention: Sustained Sustained Attention: Impaired Sustained Attention Impairment:  Functional basic Awareness: Impaired Awareness Impairment: Anticipatory impairment Problem Solving: Impaired Problem Solving Impairment: Functional basic Safety/Judgment: Other (comment)(pain and fatigue limiting at this time) Comments: Difficult to asses on evalutaion due to lethargy, will continue to assess as able Sensation Sensation Light Touch: Impaired by gross assessment Coordination Gross Motor Movements are Fluid and Coordinated: No Fine Motor Movements are Fluid and Coordinated: No Coordination and Movement Description: generalized weakness and decreased activity tolerance Finger Nose Finger Test: intact bilaterally at slow rate 9 Hole Peg Test: pinch impaired bilaterally , able to hold objects with weak lateral pinch Motor  Motor Motor - Skilled Clinical Observations: generalized weakness and decreased activity tolerance Mobility  Bed Mobility Rolling Right: Minimal Assistance - Patient > 75% Rolling Left: Minimal Assistance - Patient > 75% Supine to Sit: Minimal Assistance - Patient > 75% Sit to Supine: Minimal Assistance - Patient > 75% Transfers Sit to Stand: Maximal Assistance - Patient 25-49% Stand to Sit: Maximal Assistance - Patient 25-49%  Trunk/Postural Assessment     Balance Static Sitting Balance Static Sitting - Level of Assistance: 5: Stand by assistance Dynamic  Sitting Balance Dynamic Sitting - Level of Assistance: 4: Min assist Static Standing Balance Static Standing - Level of Assistance: 3: Mod assist Dynamic Standing Balance Dynamic Standing - Level of Assistance: 2: Max assist Extremity/Trunk Assessment RUE Assessment Passive Range of Motion (PROM) Comments: shoulder to 90, elbow and wrist WFLs, MPs WFL, PIP/DIP approx 10 of flexion Active Range of Motion (AROM) Comments: shoulder to 75-80, elbow/wrist WFL, limited grasp - holds objects with lateral pinch LUE Assessment Passive Range of Motion (PROM) Comments: shoulder to 90, elbow and wrist WFLs, MPs WFL, PIP/DIP approx 10 of flexion Active Range of Motion (AROM) Comments: shoulder 70, elbow/wrist 3/4 to full, limited grasp     Refer to Care Plan for Long Term Goals  Recommendations for other services: Neuropsych   Discharge Criteria: Patient will be discharged from OT if patient refuses treatment 3 consecutive times without medical reason, if treatment goals not met, if there is a change in medical status, if patient makes no progress towards goals or if patient is discharged from hospital.  The above assessment, treatment plan, treatment alternatives and goals were discussed and mutually agreed upon: by patient  Carlos Levering 08/24/2019, 5:02 PM

## 2019-08-24 NOTE — Progress Notes (Signed)
Patient has unstageable to nose and around nose area, stage 3 with undermining to coccyx and buttocks area. Red areas to bilateral heels aren't staged because the skin is blanchable, but slow to blanch. WOC involved, will continue plan of care. No complications noted at this time. Audie Clear, LPN

## 2019-08-24 NOTE — Progress Notes (Signed)
[facility] Progress Note   Assessment/ Plan:   1. 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  Appreciate assistance from rheumatology phone consultation. 2.  AKI--> ESRD: requiring HD since 07/17/20 without any UOP the past 3 weeks per husband and no evidence of renal recovery, s/p 08/22/19.  Next planned 2/4.  Have not pursued perm access d/t comorbidies and Raynaud's 3.  Pericardial effusion: s/p pericardial window 12/23 4.  Hepatic steatosis: followed at [facility] 5.  Malnutrition: on TF, got PEG, not a lot of PO intake  6.  CKD-MB: Auryxia as binder  7.  Anemia: ESA and iron as appropriate 8.  Afib: poor candidate for [facility] per cardiology 9.  Dispo: in CIR   Subjective:     Sleeping on HD.  In CIR now   Objective:   BP 128/88   Pulse 100   Temp (!) 97.5 F (36.4 C) (Oral)   Resp 15   Wt 39 kg   SpO2 97%   BMI 13.88 kg/m   Physical Exam: Gen: older woman, lying in bed, sleeping CVS: RRR no rub Resp: no crackles Abd: soft, NABS, PEG in place Ext: edema improved.  Labs: BMET Recent Labs  Lab 08/18/19 0240 08/19/19 0650 08/20/19 0302 08/21/19 0316 08/22/19 0316 08/23/19 0259 08/24/19 1315  NA 133* 131* 133* 126* 124* 133* 130*  K 4.2 4.6 4.2 4.7 5.3* 4.7 4.3  CL 93* 87* 94* 87* 83* 93* 90*  CO2 27 21* 24 22 23 24 26   GLUCOSE 124* 76 116* 100* 106* 122* 100*  BUN 59* 111* 58* 102* 142* 72* 119*  CREATININE 2.06* 3.22* 2.00* 3.01* 3.83* 2.33* 3.22*  CALCIUM 9.0 9.2 9.2 9.1 9.1 8.8* 8.8*  PHOS 4.7* 6.1* 3.4 4.8* 4.7* 2.8 3.1   CBC Recent Labs  Lab 08/19/19 0650 08/22/19 0316 08/23/19 0259 08/24/19 1315  WBC 4.9 3.1* 3.3* 3.7*  HGB 12.6 11.8* 12.7 10.4*  HCT 42.9 38.8 44.2 36.1  MCV 93.1 90.0 95.5 95.5  PLT 107* 127* 128* 119*      Medications:    . acetaminophen  500 mg Oral BID  . ascorbic acid  500 mg Per Tube BID  . Chlorhexidine Gluconate Cloth  6 each Topical Q0600  . Chlorhexidine Gluconate  Cloth  6 each Topical Q0600  . collagenase   Topical Daily  . diclofenac Sodium  2 g Topical BID  . diltiazem  120 mg Oral Daily  . famotidine  10 mg Oral Daily  . feeding supplement (PRO-STAT SUGAR FREE 64)  30 mL Per Tube Daily  . ferric citrate  420 mg Oral TID WC  . [START ON 08/25/2019] fluconazole  100 mg Oral Daily  . gabapentin  100 mg Per Tube Q12H  . Gerhardt's butt cream   Topical TID  . heparin  5,000 Units Subcutaneous Q8H  . hydrocortisone  25 mg Rectal BID  . loratadine  10 mg Oral Daily  . mouth rinse  15 mL Mouth Rinse BID  . multivitamin  1 tablet Oral QHS  . neomycin-bacitracin-polymyxin   Topical BID  . nystatin  5 mL Mouth/Throat QID  . pantoprazole sodium  40 mg Per Tube BID  . polyethylene glycol  17 g Oral Daily  . predniSONE  50 mg Oral Q breakfast   Followed by  . [START ON 08/21/2019] predniSONE  40 mg Oral Q breakfast   Followed by  . [START ON 09/16/2019] predniSONE  30 mg Oral  Q breakfast  . senna-docusate  1 tablet Oral BID  . sertraline  25 mg Oral BID  . thiamine  100 mg Oral Daily  . zinc sulfate  220 mg Oral Daily     Madelon Lips, MD 08/24/2019, 5:02 PM

## 2019-08-24 NOTE — Progress Notes (Signed)
Inpatient Rehabilitation  Patient information reviewed and entered into eRehab system by Christle Nolting M. Maisen Schmit, M.A., CCC/SLP, PPS Coordinator.  Information including medical coding, functional ability and quality indicators will be reviewed and updated through discharge.    

## 2019-08-24 NOTE — Evaluation (Addendum)
Physical Therapy Assessment and Plan  Patient Details  Name: Joanna Ali MRN: [id number] Date of Birth: 1951-01-22  PT Diagnosis: Abnormal posture, Abnormality of gait, Difficulty walking, Impaired cognition and Muscle weakness Rehab Potential: Fair ELOS: 2 weeks   Today's Date: 08/24/2019 PT Individual Time: 1110-1220 PT Individual Time Calculation (min): 70 min    Problem List:  Patient Active Problem List   Diagnosis Date Noted  . 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 Debility   . S/P pericardial window creation   . Thrombocytopenia ([place])   . Anemia of chronic disease   . 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 Rheumatoid arthritis ([place]) 08/15/2019  . CREST syndrome ([place]) 08/15/2019  . Atrial fibrillation with RVR ([place])   . AKI (acute kidney injury) ([place])   . Dyspnea   . S/P thoracentesis   . ESRD on hemodialysis ([place]) 07/31/2019  . Dysphagia   . Muscular weakness   . Thrush of mouth and esophagus ([place])   . Failure to thrive in adult   . Goals of care, counseling/discussion   . Palliative care by specialist   . Pressure injury of skin 07/19/2019  . Pleural effusion   . Protein-calorie malnutrition, severe 07/05/2019  . Elevated troponin 07/04/2019  . Atypical pneumonia 07/04/2019  . Hypoxia 07/04/2019  . Pericardial effusion 04/07/2019  . Hypertension 04/07/2019  . Insomnia 04/07/2019  . Monoclonal paraproteinemia   . Anasarca 04/04/2019    Past Medical History:  Past Medical History:  Diagnosis Date  . Hypertension   . Leg swelling 03/2019  . Short of breath on exertion 03/2019   Past Surgical History:  Past Surgical History:  Procedure Laterality Date  . APPENDECTOMY    . IR FLUORO GUIDE CV LINE LEFT  07/18/2019  . IR GASTROSTOMY TUBE MOD SED  07/28/2019  . IR US GUIDE VASC ACCESS LEFT  07/18/2019  . NECK SURGERY    . RIGHT AND LEFT HEART CATH N/A 07/06/2019   Procedure: RIGHT AND LEFT HEART CATH;  Surgeon: Jolaine Artist, MD;  Location: [facility];  Service: Cardiovascular;  Laterality: N/A;  . TEE WITHOUT CARDIOVERSION N/A 03/28/2019   Procedure: TRANSESOPHAGEAL ECHOCARDIOGRAM (TEE);  Surgeon: Geralynn Rile, MD;  Location: [facility];  Service: Cardiology;  Laterality: N/A;  . VIDEO ASSISTED THORACOSCOPY Right 07/12/2019   Procedure: VIDEO ASSISTED THORACOSCOPY Pericardial window ;  Surgeon: Lajuana Matte, MD;  Location: [facility];  Service: Thoracic;  Laterality: Right;    Assessment & Plan Clinical Impression: Patient is a 68 y.o. year old female with history of hepatic steatosis, HTN, peripheral neuropathy, macular degeneration, progressive DOE with fluid overload since 02/2019 and history of pericardial/pleural effusion who was admitted on 07/03/2019 with AKI, anasarca and fatigue.  History taken from chart review and husband.  She has had negative work up by rheumatology and at [facility]  She was started on IV diuresis and cardiac echo showed moderately large pericardial effusion. Cardiac cath and cardiac MRI without evidence of pericardial constriction- negative for amyloidosis.  Dr Tera Mater consulted for assistance and she underwent R-VATS with pericardial window on 07/12/2019. She continued to require diuresis and had worsening of renal function with intermittent hyperkalemia and uremia with lethary requiring initiation of HD 07/20/2019. She has had poor po intake, difficulty swallowing, persistent tail bone pain with FTT. She had PEG placed by Dr. Anselm Pancoast on 07/28/2019.  Palliative care consulted for [place] and questioned a trial of steroids empirically due to  concerns of carcinoid or multisystem inflammatory syndrome. She has had bouts of agitation and nephrology expressed concerns of steroids making anxiety worse. Question of transfer to [facility] (no beds). Rheumatology/ Dr. Kathlene November consulted for input on autoimmune process and she was started on high dose steroids due to consideration of scleroderma  with CREST syndrome.     Anti-RNA polymerase III antibody positive. Repeat SPEP negative for monoclonal protein. Dr. Jana Hakim consulted for input to rule out occult malignancy as she developed leucopenia. He  questioned possibility of malignancy with scleroderma as secondary.  CT abdomen/pelvis/chest negative and pleural fluid negative for malignancy--he felt that further screening for malignancy can be done on outpatient basis.  She underwent repeat thoracocentesis on 08/01/2019 positive for mesothelial cells--evaluate on outpatient basis once stronger.   PAF with RVR on 08/13/2019 treated with metoprolol for rate control and Dr. Margaretann Loveless  --not a candidate for coumadin or amiodarone per cardiology. Dr. Scot Dock consulted due to ischemic fingers --patient with positive pulses with negative BLE dopplers and changes felt to be due to scleroderma/small vessel disease. Metoprolol changed to Cardizem due to worsening of Raynaud's phenomenon. She was started on slow steroid taper on 1/26--to decrease by 10 mg per week to 30 mg daily with follow up with Rheum. Patient with AKI with anuria requiring HD TTS.  Please see preadmission assessment from earlier today. Patient transferred to CIR on 08/23/2019 .   Patient currently requires max with mobility secondary to muscle weakness and muscle joint tightness, decreased cardiorespiratoy endurance, decreased awareness, decreased memory and delayed processing and decreased sitting balance, decreased standing balance, decreased postural control and decreased balance strategies.  Prior to hospitalization, patient was modified independent  with mobility and lived with Spouse in a House home.  Home access is 3Stairs to enter.  Patient will benefit from skilled PT intervention to maximize safe functional mobility, minimize fall risk and decrease caregiver burden for planned discharge home with 24 hour assist.  Anticipate patient will benefit from follow up [facility] at discharge.  PT -  End of Session Activity Tolerance: Tolerates 10 - 20 min activity with multiple rests Endurance Deficit: Yes Endurance Deficit Description: tolerated 30 min of minimal mobility with rest breaks PT Assessment Rehab Potential (ACUTE/IP ONLY): Fair PT Barriers to Discharge: Home environment access/layout;Decreased caregiver support;Wound Care;Weight PT Barriers to Discharge Comments: 4 STE home without rails, husband to provide 24/7 assist and neighbors available to assist PRN PT Patient demonstrates impairments in the following area(s): Balance;Edema;Endurance;Motor;Nutrition;Pain;Perception;Safety;Sensory;Skin Integrity PT Transfers Functional Problem(s): Bed Mobility;Bed to Chair;Car;Furniture PT Locomotion Functional Problem(s): Ambulation;Wheelchair Mobility;Stairs PT Plan PT Intensity: Minimum of 1-2 x/day ,45 to 90 minutes PT Frequency: Total of 15 hours over 7 days of combined therapies PT Duration Estimated Length of Stay: 2 weeks PT Treatment/Interventions: Ambulation/gait training;Discharge planning;Functional mobility training;Psychosocial support;Therapeutic Activities;Visual/perceptual remediation/compensation;Balance/vestibular training;Disease management/prevention;Neuromuscular re-education;Skin care/wound management;Therapeutic Exercise;Wheelchair propulsion/positioning;DME/adaptive equipment instruction;Pain management;Splinting/orthotics;UE/LE Strength taining/ROM;Cognitive remediation/compensation;Community reintegration;Functional electrical stimulation;Patient/family education;Stair training;UE/LE Coordination activities PT Transfers Anticipated Outcome(s): min A using LRAD PT Locomotion Anticipated Outcome(s): Min A using LRAD 25 ft PT Recommendation Recommendations for Other Services: Neuropsych consult Follow Up Recommendations: Home health PT Patient destination: Home Equipment Details: Has [facility]  Skilled Therapeutic Intervention In addition to the PT evaluation below,  the patient performed the following skilled PT interventions: Patient in bed asleep with her husband in the room upon PT arrival. Patient was difficult to arouse initially and remained lethargic throughout session. She was able to answer basic questions and respond to simple one-step commands and was  agreeable to PT session. Patient reported severe rectal pain during session while having a BM, RN and MD made aware. PT provided repositioning, rest breaks, and distraction as pain interventions throughout session. Patient's husband participated in hands on training during session and was very attentive to his wife throughout session.   Therapeutic Activity: Bed Mobility: Patient performed rolling R/L x4 and supine to/from sit with min A in a flat bed without use of bed rails.  Transfers: Patient performed sit to/from stand x2 and stand pivot to the [facility] x1 with max A initially and progressing to mod A with B HHA. Provided verbal cues for brining hips forward and erect posture. Patient's husband performed stand pivot transfer back to the bed, stating that he was doing this on acute. Demonstrated safe technique, provided cues to allow the patient perform as much of the activity as possible. Patient was continent of bowl with on BSC and tolerated sitting on the BSC x18 min working on sitting tolerance and arousal. She required total A for peri-care and LB dressing in standing and at bed level with rolling to change sacral dressing and brief.  Vitals:  Supine: BP 165/110 HR 87 Sitting: BP 175/101 HR 98 Supine at end of session: 151/95 HR 88  Patient in bed with her husband at bedside at end of session with breaks locked, bed alarm set, and all needs within reach.   Instructed pt in results of PT evaluation as detailed below, PT POC, rehab potential, rehab goals, and discharge recommendations. Additionally discussed CIR's policies regarding fall safety and use of chair alarm and/or quick release belt. Pt  verbalized understanding and in agreement. Will update pt's family members as they become available.    PT Evaluation Precautions/Restrictions Precautions Precautions: Fall Precaution Comments: PEG tube, sacral wound, watch BP with mobility, maintain systolic <680 mmHg Restrictions Weight Bearing Restrictions: No Home Living/Prior Functioning Home Living Available Help at Discharge: Family;Available 24 hours/day;Neighbor;Available PRN/intermittently Type of Home: House Home Access: Stairs to enter [employer] of Steps: 3 Entrance Stairs-Rails: None Home Layout: Two level;Able to live on main level with bedroom/bathroom Alternate Level Stairs-Number of Steps: about 16 Alternate Level Stairs-Rails: Right Bathroom Shower/Tub: Multimedia programmer: Standard Additional Comments: cushion for toilet  Lives With: Spouse Prior Function Level of Independence: Independent with gait;Independent with transfers;Needs assistance with ADLs;Needs assistance with homemaking  Able to Take Stairs?: Yes Comments: Uses SPC for ambulation. Sits to shower. Uses BSC for night time. Gets help with shoes/socks. Spouse does IADLs. Vision/Perception    Wears glasses at all times; has history of macular degeneration PTA Cognition Overall Cognitive Status: Impaired/Different from baseline Arousal/Alertness: Lethargic Orientation Level: Oriented to person;Disoriented to time Comments: Difficult to asses on evalutaion due to lethargy, will continue to assess as able Sensation Sensation Light Touch: Appears Intact Coordination Gross Motor Movements are Fluid and Coordinated: No Coordination and Movement Description: generalized weakness and decreased activity tolerance Motor  Motor Motor: Other (comment) Motor - Skilled Clinical Observations: generalized weakness and decreased activity tolerance  Mobility Bed Mobility Bed Mobility: Rolling Right;Rolling Left;Supine to Sit;Sit to  Supine Rolling Right: Minimal Assistance - Patient > 75% Rolling Left: Minimal Assistance - Patient > 75% Supine to Sit: Minimal Assistance - Patient > 75% Sit to Supine: Minimal Assistance - Patient > 75% Transfers Transfers: Sit to Stand;Stand to Sit;Stand Pivot Transfers Sit to Stand: Maximal Assistance - Patient 25-49% Stand to Sit: Maximal Assistance - Patient 25-49% Stand Pivot Transfers: Maximal Assistance - Patient 25 -  49% Transfer (Assistive device): None Locomotion  Gait Ambulation: No(Limited by lethargy and elevated BP in sitting) Gait Gait: No(Limited by lethargy and elevated BP in sitting) Stairs / Additional Locomotion Stairs: No(Limited by lethargy and elevated BP in sitting) Wheelchair Mobility Wheelchair Mobility: No(Limited by lethargy and elevated BP in sitting)  Trunk/Postural Assessment  Cervical Assessment Cervical Assessment: Exceptions to WFL(forward head) Thoracic Assessment Thoracic Assessment: Exceptions to WFL(rounded shoulders) Lumbar Assessment Lumbar Assessment: Exceptions to WFL(posterior pelvic tilt) Postural Control Postural Control: Deficits on evaluation(decreased/delayed)  Balance Balance Balance Assessed: Yes Static Sitting Balance Static Sitting - Balance Support: Right upper extremity supported;Left upper extremity supported;Feet supported Static Sitting - Level of Assistance: 5: Stand by assistance Dynamic Sitting Balance Dynamic Sitting - Balance Support: Right upper extremity supported;Left upper extremity supported;Feet supported Dynamic Sitting - Level of Assistance: 4: Min assist Static Standing Balance Static Standing - Balance Support: Bilateral upper extremity supported;During functional activity Static Standing - Level of Assistance: 3: Mod assist Dynamic Standing Balance Dynamic Standing - Balance Support: Bilateral upper extremity supported;During functional activity Dynamic Standing - Level of Assistance: 2: Max  assist Extremity Assessment      RLE Assessment RLE Assessment: Exceptions to [facility] Active Range of Motion (AROM) Comments: Limited assessment due to lethargy, tight hamstrings and heel cords General Strength Comments: [place] at least 3/5, can perform SLR in supine LLE Assessment LLE Assessment: Exceptions to [facility] Active Range of Motion (AROM) Comments: Limited assessment due to lethargy, tight hamstrings and heel cords General Strength Comments: Groslly at least 3/5, can perform SLR in supine    Refer to Care Plan for Long Term Goals  Recommendations for other services: Neuropsych  Discharge Criteria: Patient will be discharged from PT if patient refuses treatment 3 consecutive times without medical reason, if treatment goals not met, if there is a change in medical status, if patient makes no progress towards goals or if patient is discharged from hospital.  The above assessment, treatment plan, treatment alternatives and goals were discussed and mutually agreed upon: by patient and by family  Doreene Burke PT, DPT  08/24/2019, 3:50 PM

## 2019-08-24 NOTE — Consult Note (Signed)
[facility] Nurse Consult Note: Reason for Consult: Patient with complex medical history of scleroderma (seen by Dr. Gilles Chiquito) on 08/15/19), chronic pericardial effusion, moderate AVR, hypertension, hepatic steatosis, anasarca thought to be related to her liver disease, AKI requiring dialysis and peripheral neuropathy. 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  Bilateral heels are reddened, but blanch-albeit slowly. This may be related to her Raynaud's. Nutritional supplementation will be required to resolve the skin issues, in particular, the sacral pressure injury. Wound type: Pressure, shear, moisture Pressure Injury POA: Yes Measurement: Sacral Stage 3 PI: 2.8cm x 1.5cm x 1.5cm with 2.5cm undermining. 90% red, moist wound bed, 10% yellow slough. Moderate amount of serous drainage. Intact periwound. Note:  Patient reports extreme discomfort with cleansing, measuring and lightly filling of defect.  Recommend pre-medication prior to daily wound care. Bilateral heels with slow-to-blanch erythema: May be related to Raynaud's. I have provided bilateral pressure redistribution heel boots for use while in bed to prevent pressure injury Nasal columella and surrounding skin:  Unstageable PI: Medical device related pressure injury: 2cm x 3cm purple discoloration with 1cm x 1cm central area of white on the columella. No drainage.  If this does not show even incremental progress in 7-10 days, you may with to consult with Plastic Surgery. Perineum with erythema, edema: Gerhart's Butt Cream has been ordered by another provider and this should positively impact the tissue integrity in this region if used following perineal care post incontinence episodes. A few doses of systemic  antifungal (eg., Diflucan) may more expeditiously resolve any fungal overgrowth. Recommend removal of adult briefs at night if patient is in agreement. Wound bed:As noted above Drainage (amount, consistency, odor) As noted above Periwound: As noted above Dressing procedure/placement/frequency: Patient is on a mattress replacement with low air loss feature and is being turned and repositioned and time in the supine position is minimized. Topical care guidance for Nursing for the sacral ulcer using a silver hydrofiber (Aquacel Advantage) once daily, heels (silicone foam plus pressure redistribution heel boots), perineal area (Gerhart's Butt Cream, a compounded 1:1:1 product consisting of lotrimin:hydrocortisone:zinc oxide) and nose is provided via the Orders. As previously noted, Nutritional Support will need to be a cornerstone of her treatment plan for improvement in her integumentary status.  I have discussed this patient and her skin integrity issues with Dr. Dagoberto Ligas.  She is planning on beginning systemic diflucan today.  Thank you for inviting [place] to consult on this complex patient.  [facility] nursing team will not follow, but will remain available to this patient, the nursing and medical teams.  Please re-consult if needed.  Thank you. Maudie Flakes, MSN, RN, [place], [facility]  Pager# [phone]

## 2019-08-25 ENCOUNTER — Inpatient Hospital Stay (HOSPITAL_COMMUNITY): Payer: Medicare Other | Admitting: Occupational Therapy

## 2019-08-25 ENCOUNTER — Inpatient Hospital Stay (HOSPITAL_COMMUNITY): Payer: Medicare Other | Admitting: Physical Therapy

## 2019-08-25 MED ORDER — CHLORHEXIDINE GLUCONATE CLOTH 2 % EX PADS
6.0000 | MEDICATED_PAD | Freq: Every day | CUTANEOUS | Status: DC
  Administered 2019-08-25 – 2019-08-29 (×5): 6 via TOPICAL

## 2019-08-25 MED ORDER — IPRATROPIUM-ALBUTEROL 0.5-2.5 (3) MG/3ML IN SOLN: 3.0000 mL | RESPIRATORY_TRACT | Status: DC | PRN

## 2019-08-25 NOTE — Progress Notes (Signed)
[facility] NEPHROLOGY PROGRESS NOTE  Assessment/ Plan: Pt is a 69 y.o. yo female scleroderma with crest syndrome, AKI now progressed to ESRD.  # AKI, HD dependent now progressed to ESRD requiring HD since 07/17/20.  Remains anuric.   HD per TTS schedule, last HD yesterday with 2.3 L UF.  Plan for next hemodialysis tomorrow. Have not pursued perm access d/t comorbidies and Raynaud's  # Likely Scleroderma with CREST syndrome: on prednisone and CCB for Raynaud's.    #Anemia of CKD: Hemoglobin at goal.  Continue to monitor.  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  Continue Auryxia.  # Pericardial effusion: s/p pericardial window 12/23.  #  Dispo: in [facility].   Subjective: Seen and examined.  Chart reviewed.  Patient is doing inpatient rehab.  No new event. Objective Vital signs in last 24 hours: Vitals:   08/24/19 1802 08/24/19 1817 08/24/19 1958 08/25/19 0510  BP: (!) 154/97  (!) 151/94 (!) 140/99  Pulse: (!) 120 (!) 113 (!) 105 96  Resp:  20 19 20   Temp:   97.8 F (36.6 C) 98 F (36.7 C)  TempSrc:    Oral  SpO2:  100% 100% 98%  Weight:    38.6 kg   Weight change: 0.444 kg  Intake/Output Summary (Last 24 hours) at 08/25/2019 1113 Last data filed at 08/25/2019 0916 Gross per 24 hour  Intake 730 ml  Output 2320 ml  Net -1590 ml       Labs: Basic Metabolic Panel: Recent Labs  Lab 08/22/19 0316 08/23/19 0259 08/24/19 1315  NA 124* 133* 130*  K 5.3* 4.7 4.3  CL 83* 93* 90*  CO2 23 24 26   GLUCOSE 106* 122* 100*  BUN 142* 72* 119*  CREATININE 3.83* 2.33* 3.22*  CALCIUM 9.1 8.8* 8.8*  PHOS 4.7* 2.8 3.1   Liver Function Tests: Recent Labs  Lab 08/22/19 0316 08/23/19 0259 08/24/19 1315  ALBUMIN 2.8* 2.8* 2.5*   No results for input(s): LIPASE, AMYLASE in the last 168 hours. No results for input(s): AMMONIA in the last 168 hours. CBC: Recent Labs  Lab 08/19/19 0650 08/19/19 0650 08/22/19 0316 08/23/19 0259 08/24/19 1315  WBC 4.9   < > 3.1* 3.3* 3.7*   HGB 12.6   < > 11.8* 12.7 10.4*  HCT 42.9   < > 38.8 44.2 36.1  MCV 93.1  --  90.0 95.5 95.5  PLT 107*   < > 127* 128* 119*   < > = values in this interval not displayed.   Cardiac Enzymes: No results for input(s): CKTOTAL, CKMB, CKMBINDEX, TROPONINI in the last 168 hours. CBG: Recent Labs  Lab 08/22/19 1932 08/22/19 2344 08/23/19 0324 08/23/19 1433 08/23/19 1648  GLUCAP 143* 106* 125* 128* 125*    Iron Studies: No results for input(s): IRON, TIBC, TRANSFERRIN, FERRITIN in the last 72 hours. Studies/Results: No results found.  Medications: Infusions: . feeding supplement (NEPRO CARB STEADY) 1,000 mL (08/25/19 0100)    Scheduled Medications: . acetaminophen  500 mg Oral BID  . ascorbic acid  500 mg Per Tube BID  . Chlorhexidine Gluconate Cloth  6 each Topical Q0600  . Chlorhexidine Gluconate Cloth  6 each Topical Q0600  . collagenase   Topical Daily  . diclofenac Sodium  2 g Topical BID  . diltiazem  120 mg Oral Daily  . famotidine  10 mg Oral Daily  . feeding supplement (PRO-STAT SUGAR FREE 64)  30 mL Per Tube Daily  . ferric citrate  420 mg Oral  TID WC  . fluconazole  100 mg Oral Daily  . gabapentin  100 mg Per Tube Q12H  . Gerhardt's butt cream   Topical TID  . heparin  5,000 Units Subcutaneous Q8H  . hydrocortisone  25 mg Rectal BID  . loratadine  10 mg Oral Daily  . mouth rinse  15 mL Mouth Rinse BID  . multivitamin  1 tablet Oral QHS  . neomycin-bacitracin-polymyxin   Topical BID  . nystatin  5 mL Mouth/Throat QID  . pantoprazole sodium  40 mg Per Tube BID  . polyethylene glycol  17 g Oral Daily  . predniSONE  50 mg Oral Q breakfast   Followed by  . [START ON 08/28/2019] predniSONE  40 mg Oral Q breakfast   Followed by  . [START ON 08/30/2019] predniSONE  30 mg Oral Q breakfast  . senna-docusate  1 tablet Oral BID  . sertraline  25 mg Oral BID  . thiamine  100 mg Oral Daily  . zinc sulfate  220 mg Oral Daily    have reviewed scheduled and prn  medications.  Physical Exam: General:NAD, comfortable Heart:RRR, s1s2 nl Lungs:clear b/l, no crackle Abdomen:soft, Non-tender, G-tube in place Extremities:No edema Dialysis Access: HD catheter.  Endiya Klahr Prasad Jawon Dipiero 08/25/2019,11:13 AM  LOS: 2 days  Pager: [phone]

## 2019-08-25 NOTE — Progress Notes (Signed)
Occupational Therapy Session Note  Patient Details  Name: Joanna Ali MRN: [id number] Date of Birth: [birth year]  Today's Date: 08/25/2019 OT Individual Time: 1330-1415 OT Individual Time Calculation (min): 45 min    Short Term Goals: Week 1:  OT Short Term Goal 1 (Week 1): Patient will complete bed mobility with CS OT Short Term Goal 2 (Week 1): patient will complete sit to stand and SPT with mod A OT Short Term Goal 3 (Week 1): patient will complete UB bathing/dressing with mod A OT Short Term Goal 4 (Week 1): Patient will tolerate sitting upright for 1 hour and perform weight shifts with min a  Skilled Therapeutic Interventions/Progress Updates:    1:1 Pt in bed when arrived. Husband present and offering support and encouragement. Pt came to EOB with max A. PT able to sit EOB with supervision. Willing to participate in dressing (changing clothes). Pt required total A to doff shirt and mod A to don clean shirt. Pt requires activity to be slow. She often closes her eyes and needs frequent reminders to complete task. Pt asking for water frequently in session. Pt performed sit to stand with max A to pull down pants and total  A to thread and don clean pants.   Pt requested to return to supine and closed eyes. Pt HR remained in 90s to 103 and 100% O2. Left husband at pt's bedside.   Therapy Documentation Precautions:  Precautions Precautions: Fall Precaution Comments: PEG tube, sacral wound, watch BP with mobility, maintain systolic <491 mmHg Restrictions Weight Bearing Restrictions: No General:   Vital Signs: Therapy Vitals Temp: 97.7 F (36.5 C) Temp Source: Oral Pulse Rate: 86 Resp: 18 BP: (!) 156/92 Patient Position (if appropriate): Lying Oxygen Therapy O2 Device: Room Air Pain: Ongoing pain in buttock region. Repositioning and breaks help At end of session return to laying down.  Therapy/Group: Individual Therapy  Willeen Cass [facility] 08/25/2019, 8:56 PM

## 2019-08-25 NOTE — Progress Notes (Signed)
Social Work Assessment and Plan   Patient Details  Name: Joanna Ali MRN: [id number] Date of Birth: May 14, 1951  Today's Date: 08/25/2019  Problem List:  Patient Active Problem List   Diagnosis Date Noted  . 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 Debility   . S/P pericardial window creation   . Thrombocytopenia ([place])   . Anemia of chronic disease   . Scleroderma progressive ([place]) 08/15/2019  . Rheumatoid arthritis ([place]) 08/15/2019  . CREST syndrome ([place]) 08/15/2019  . Atrial fibrillation with RVR ([place])   . AKI (acute kidney injury) ([place])   . Dyspnea   . S/P thoracentesis   . ESRD on hemodialysis ([place]) 07/31/2019  . Dysphagia   . Muscular weakness   . Thrush of mouth and esophagus ([place])   . Failure to thrive in adult   . Goals of care, counseling/discussion   . Palliative care by specialist   . Pressure injury of skin 07/19/2019  . Pleural effusion   . Protein-calorie malnutrition, severe 07/05/2019  . Elevated troponin 07/04/2019  . Atypical pneumonia 07/04/2019  . Hypoxia 07/04/2019  . Pericardial effusion 04/07/2019  . Hypertension 04/07/2019  . Insomnia 04/07/2019  . Monoclonal paraproteinemia   . Anasarca 04/04/2019   Past Medical History:  Past Medical History:  Diagnosis Date  . Hypertension   . Leg swelling 03/2019  . Short of breath on exertion 03/2019   Past Surgical History:  Past Surgical History:  Procedure Laterality Date  . APPENDECTOMY    . IR FLUORO GUIDE CV LINE LEFT  07/18/2019  . IR GASTROSTOMY TUBE MOD SED  07/28/2019  . IR US GUIDE VASC ACCESS LEFT  07/18/2019  . NECK SURGERY    . RIGHT AND LEFT HEART CATH N/A 07/06/2019   Procedure: RIGHT AND LEFT HEART CATH;  Surgeon: Jolaine Artist, MD;  Location: [facility];  Service: Cardiovascular;  Laterality: N/A;  . TEE WITHOUT CARDIOVERSION N/A 03/28/2019   Procedure: TRANSESOPHAGEAL ECHOCARDIOGRAM (TEE);  Surgeon: Geralynn Rile, MD;  Location: [facility];  Service: Cardiology;  Laterality: N/A;  . VIDEO ASSISTED THORACOSCOPY Right 07/12/2019   Procedure: VIDEO ASSISTED THORACOSCOPY Pericardial window ;  Surgeon: Lajuana Matte, MD;  Location: [facility];  Service: Thoracic;  Laterality: Right;   Social History:  reports that she quit smoking about 40 years ago. Her smoking use included cigarettes. [name]. She has never used smokeless tobacco. [name].  Family / Support Systems Marital Status: Married How Long?: 72 years Patient Roles: Spouse Spouse/Significant Other: Brynda Heick [phone]) Children: 1 adult child; lives in [place] Other Supports: Support from neighbors/friends Anticipated Caregiver: Primary caregiver will be pt husband Ability/Limitations of Caregiver: None reported Caregiver Availability: 24/7 Family Dynamics: Pt and husband live together, and often travel since retirement.  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 Recent Psychosocial Issues: None Psychiatric History: Denies Substance Abuse History: quit drinking  red wine 6 mos ago when she reports having breathing issues  Patient / Family Perceptions, Expectations & Goals Pt/Family understanding of illness & functional limitations: Pt husband has a general  understanding of pt condition, and support pt will need once home. Intends to hire caregivers if needed. Premorbid pt/family roles/activities: Pt was independent, and they often travelled Anticipated changes in roles/activities/participation: Pt husband is prepared to be primary caregiver Pt/family expectations/goals: Pt goal is stand and walk. Husband overall goal is to get better, and hopefully leave without being on dialysis.  Community Resources Express Scripts: None Premorbid Home Care/DME Agencies: None Transportation available at discharge: Husband to transport home Resource referrals recommended: Neuropsychology  Discharge Planning Living Arrangements: Spouse/significant other Support Systems: Spouse/significant other, Friends/neighbors, Social worker community Type of Residence: Private residence Insurance Resources: [employer], Multimedia programmer (specify)([employer] [place]) Financial Resources: SSI, Other (Comment)(Retirement/Savings) Financial Screen Referred: No Living Expenses: Own Money Management: Patient Does the patient have any problems obtaining your medications?: No Home Management: Pt managed finances and medications Patient/Family Preliminary Plans: Husband is prepared to assume role. Social Work Anticipated Follow Up Needs: [place] Additional Notes/Comments: Discharge to home with the support of her husband Expected length of stay: 2 weeks  Clinical Impression SW met with pt and pt husband in room to introduce self, explain role, and discuss discharge process. Pt denies jail/prison hx. Pt HCPOA/Advanced Care Directive provided and copy placed in pt chart.  Abeera Flannery A Manasi Dishon 08/25/2019, 4:10 PM

## 2019-08-25 NOTE — Care Management (Signed)
Inpatient [facility] Individual Statement of Services  Patient Name:  Joanna Ali  Date:  08/25/2019  Welcome to the [facility].  Our goal is to provide you with an individualized program based on your diagnosis and situation, designed to meet your specific needs.  With this comprehensive rehabilitation program, you will be expected to participate in at least 3 hours of rehabilitation therapies Monday-Friday, with modified therapy programming on the weekends.  Your rehabilitation program will include the following services:  Physical Therapy (PT), Occupational Therapy (OT), Speech Therapy (ST), 24 hour per day rehabilitation nursing, Neuropsychology, Case Management (Social Worker), Rehabilitation Medicine, Nutrition Services and Pharmacy Services  Weekly team conferences will be held on Tuesdays to discuss your progress.  Your Social Worker will talk with you frequently to get your input and to update you on team discussions.  Team conferences with you and your family in attendance may also be held.  Expected length of stay: 2 weeks    Overall anticipated outcome: Moderate to Minimal Assistance  Depending on your progress and recovery, your program may change. Your Social Worker will coordinate services and will keep you informed of any changes. Your Social Worker's name and contact numbers are listed  below.  The following services may also be recommended but are not provided by the [facility] will be made to provide these services after discharge if needed.  Arrangements include referral to agencies that provide these services.  Your insurance has been verified to be:  Medicare A/B and [employer] [place]  Your primary doctor is:  Jacqlyn Larsen  Pertinent information will be shared with your  doctor and your insurance company.  Social Worker:  Loralee Pacas, MSW  Information discussed with and copy given to patient by: Rana Snare, 08/25/2019, 10:53 AM

## 2019-08-25 NOTE — Progress Notes (Signed)
Occupational Therapy Session Note  Patient Details  Name: Joanna Ali MRN: [id number] Date of Birth: 1950-10-20  Today's Date: 08/25/2019 OT Individual Time: 0840-0900 OT Individual Time Calculation (min): 20 min  and Today's Date: 08/25/2019 OT Missed Time: 10 Minutes Missed Time Reason: Nursing care   Short Term Goals: Week 1:  OT Short Term Goal 1 (Week 1): Patient will complete bed mobility with CS OT Short Term Goal 2 (Week 1): patient will complete sit to stand and SPT with mod A OT Short Term Goal 3 (Week 1): patient will complete UB bathing/dressing with mod A OT Short Term Goal 4 (Week 1): Patient will tolerate sitting upright for 1 hour and perform weight shifts with min a  Skilled Therapeutic Interventions/Progress Updates:    At start of session, pt in room with nursing providing nursing care.  Pt received in sidelying in bed. Agreeable to try to don pants and sit to EOB. Nursing reported that pt had just been up and sat on [facility] for 20 min. Pt rolled onto back with min A and then cried out in pain due to pelvic "tailbone" pain. Pt was able to lift each leg partially for therapist to don pants over feet.  Attempted to have pt use her hands to try to pull pants on while doing a small bridge but she was only able to lift hips slightly and then yelled out in pain. Pt stated she was going to throw up. Had pt come to her side and got a bucket.  Pt's O2 sats lowered to 80s so cued pt to relax her breathing and then they gradually resumed.  With pt on her L side, pulled pants over hips and she cried out in pain as the slight pressure from the fabric hurt her bottom.   Pt did not vomit and started to feel better. Session time was over.   RN in room with patient.    Therapy Documentation Precautions:  Precautions Precautions: Fall Precaution Comments: PEG tube, sacral wound, watch BP with mobility, maintain systolic <458 mmHg Restrictions Weight Bearing Restrictions: No  General OT  Amount of Missed Time: 10 Minutes Pain: Pain Assessment Pain Score: 3  Faces Pain Scale: No hurt PAINAD (Pain Assessment in Advanced Dementia) Breathing: normal Negative Vocalization: none Facial Expression: smiling or inexpressive Body Language: relaxed Consolability: no need to console PAINAD Score: 0 ADL: ADL Eating: Minimal assistance Where Assessed-Eating: Bed level Grooming: Maximal assistance Where Assessed-Grooming: Sitting at sink, Wheelchair Upper Body Bathing: Maximal assistance Where Assessed-Upper Body Bathing: Wheelchair, Sitting at sink Lower Body Bathing: Dependent Where Assessed-Lower Body Bathing: Bed level Upper Body Dressing: Maximal assistance Where Assessed-Upper Body Dressing: Wheelchair, Sitting at sink Lower Body Dressing: Dependent Where Assessed-Lower Body Dressing: Bed level Toileting: Dependent Where Assessed-Toileting: Bedside Commode Toilet Transfer: Maximal assistance Toilet Transfer Method: Stand pivot Toilet Transfer Equipment: Bedside commode   Therapy/Group: Individual Therapy  [place] 08/25/2019, 12:24 PM

## 2019-08-25 NOTE — Progress Notes (Signed)
Physical Therapy Session Note  Patient Details  Name: Joanna Ali MRN: [id number] Date of Birth: 08-31-1950  Today's Date: 08/25/2019 PT Individual Time: 1002-1100 PT Individual Time Calculation (min): 58 min   Short Term Goals: Week 1:  PT Short Term Goal 1 (Week 1): Patient will peroform bed mobility with CGA consistently. PT Short Term Goal 2 (Week 1): Patient will perfomr transfers with mod A using LRAD. PT Short Term Goal 3 (Week 1): Patient with initiate gait training.  Skilled Therapeutic Interventions/Progress Updates:  Pt presents right side-lying in bed , arouseable to participate in therapy.  Pt states pain in throat as well as nauseated, but nursing states meds given for nausea.  Pt participated in multiple sup <>sit transfers w/ min A and verbal and visual cues for hand placement .  Pt sat EOB w/ occasional min A, c/o some pain w/ wound on sacrum, but not quantifying.  Pt performed sit to stand w/ mod A and cues for pushing w/ UEs and then holding PT elbows.  Pt shuffled feet to perform SPT bed <> w/c.  W/c in room too tall and pt unable to scoot back into.  PT retrieved w/c and leg rests.  Pt again transferred bed <> w/c and sat approx. 2'.  Pt very fatigued and returned to supine in bed w/ all needs in reach and bed alarm on.  Spouse present for 1/2 of treatment and remains w/ pt at conclusion of therapy.     Therapy Documentation Precautions:  Precautions Precautions: Fall Precaution Comments: PEG tube, sacral wound, watch BP with mobility, maintain systolic <850 mmHg Restrictions Weight Bearing Restrictions: No General:   Vital Signs:  Pain: Pain Assessment Pain Score: 3  Faces Pain Scale: No hurt PAINAD (Pain Assessment in Advanced Dementia) Breathing: normal Negative Vocalization: none Facial Expression: smiling or inexpressive Body Language: relaxed Consolability: no need to console PAINAD Score: 0       Therapy/Group: Individual Therapy  [name] 08/25/2019, 12:14 PM

## 2019-08-25 NOTE — Progress Notes (Signed)
[place] PHYSICAL MEDICINE & REHABILITATION PROGRESS NOTE   Subjective/Complaints:   LBM right now- on toilet having BM- no complaints this AM.   Is clearing her throat a LOT esp after drinking water.     ROS: denies SOB, CP, abd pain, N/V/C- has loose stools due to TFs; denies vision changes, has sore throat Objective:   No results found. Recent Labs    08/23/19 0259 08/24/19 1315  WBC 3.3* 3.7*  HGB 12.7 10.4*  HCT 44.2 36.1  PLT 128* 119*   Recent Labs    08/23/19 0259 08/24/19 1315  NA 133* 130*  K 4.7 4.3  CL 93* 90*  CO2 24 26  GLUCOSE 122* 100*  BUN 72* 119*  CREATININE 2.33* 3.22*  CALCIUM 8.8* 8.8*    Intake/Output Summary (Last 24 hours) at 08/25/2019 0846 Last data filed at 08/25/2019 0600 Gross per 24 hour  Intake 538 ml  Output 2320 ml  Net -1782 ml     Physical Exam: Vital Signs Blood pressure (!) 140/99, pulse 96, temperature 98 F (36.7 C), temperature source Oral, resp. rate 20, weight 38.6 kg, SpO2 98 %.   Physical Exam  Vitals reviewed and labs reviewed. Constitutional:  Emaciated. Cachetic; shiny skin due to scleroderma all over- like skin pulled tight; sitting up on toilet and CNA in room; kept clearing throat esp after drinking water; hoarse quiet voice, NAD HENT:  Head: Normocephalic and atraumatic.  Eyes: conjugate gaze  Neck: No tracheal deviation present.   CV: RRR Respiratory: tight air movement in lungs B/L diffusely;    GI: soft, NT, ND, (+)BS hypoactive; getting TF via PEG Musculoskeletal:     Comments: No edema or tenderness in extremities  Neurological: She is alert.  Motor: Bilateral upper extremities: 3+-4-/5 proximal distal Bilateral lower extremities: Hip flexion 3+-4-/5, knee extension 3 -/5 (limited due to contractures), ankle dorsiflexion 3-3+/5 Sensation intact light touch  Skin:  Scattered abrasions Peripheral cyanosis - very cold hands and feet Psychiatric: Her affect is blunt. Her speech is delayed. She  is slowed.     Assessment/Plan: 1. Functional deficits secondary to debility due to pericardia windown and pericardial effusion/ Scleroderma/Raynaud's/new ESRD on HD which require 3+ hours per day of interdisciplinary therapy in a comprehensive inpatient rehab setting.  Physiatrist is providing close team supervision and 24 hour management of active medical problems listed below.  Physiatrist and rehab team continue to assess barriers to discharge/monitor patient progress toward functional and medical goals  Care Tool:  Bathing    Body parts bathed by patient: Chest, Right upper leg, Left upper leg, Face, Left arm, Right arm   Body parts bathed by helper: Right arm, Left arm, Chest, Abdomen, Front perineal area, Buttocks, Right upper leg, Left upper leg, Right lower leg, Left lower leg, Face     Bathing assist Assist Level: Total Assistance - Patient < 25%     Upper Body Dressing/Undressing Upper body dressing   What is the patient wearing?: Pull over shirt    Upper body assist Assist Level: Total Assistance - Patient < 25%    Lower Body Dressing/Undressing Lower body dressing      What is the patient wearing?: Pants     Lower body assist Assist for lower body dressing: Total Assistance - Patient < 25%     Toileting Toileting    Toileting assist Assist for toileting: Dependent - Patient 0%     Transfers Chair/bed transfer  Transfers assist  Chair/bed transfer activity did  not occur: N/A  Chair/bed transfer assist level: Maximal Assistance - Patient 25 - 49%     Locomotion Ambulation   Ambulation assist   Ambulation activity did not occur: Safety/medical concerns(Limited due to lethargy and elevated BP)          Walk 10 feet activity   Assist  Walk 10 feet activity did not occur: Safety/medical concerns(Limited due to lethargy and elevated BP)        Walk 50 feet activity   Assist Walk 50 feet with 2 turns activity did not occur:  Safety/medical concerns(Limited due to lethargy and elevated BP)         Walk 150 feet activity   Assist Walk 150 feet activity did not occur: Safety/medical concerns(Limited due to lethargy and elevated BP)         Walk 10 feet on uneven surface  activity   Assist Walk 10 feet on uneven surfaces activity did not occur: Safety/medical concerns(Limited due to lethargy and elevated BP)         Wheelchair     Assist     Wheelchair activity did not occur: Safety/medical concerns(Limited due to lethargy and elevated BP)         Wheelchair 50 feet with 2 turns activity    Assist    Wheelchair 50 feet with 2 turns activity did not occur: Safety/medical concerns(Limited due to lethargy and elevated BP)       Wheelchair 150 feet activity     Assist  Wheelchair 150 feet activity did not occur: Safety/medical concerns(Limited due to lethargy and elevated BP)       Blood pressure (!) 140/99, pulse 96, temperature 98 F (36.7 C), temperature source Oral, resp. rate 20, weight 38.6 kg, SpO2 98 %.  Medical Problem List and Plan: 1.  Deficits with mobility, transfers, endurance, self-care secondary to debility.              -patient may may shower             -ELOS/Goals: 13-17 days/Supervision             Admit to CIR 2.  Antithrombotics: -DVT/anticoagulation:  Pharmaceutical: Heparin             -antiplatelet therapy: N/A 3. Peripheral neuropathy/Pain Management:  Tylenol bid with oxycodone prn.   4. Mood: Team to provide ego support. LCSW to follow for evaluation and support.              -antipsychotic agents: N/A 5. Neuropsych: This patient is not fully capable of making decisions on her own behalf. 6. 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 Add Vitamin C/Zinc for healing. ON tube feeds for  nutritional support due to FTT. Air mattress overlay.  7. Fluids/Electrolytes/Nutrition: Strict I/O with daily weights. Regal diet with 1500 cc FR.  8. 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 Cont Cardizem. 9. A fib with RVR: [facility]. Monitor HR tid --rate controlled on Cardizem.             Monitor with increased mobility.  10. Leucopenia: Stable--follow up with Heme/onc for work up after discharge.  CBC ordered for tomorrow.  2/4- WBC is 3.3- will monitor 11. Metabolic encephalopathy: Treated with IV thiamine and has improved with decrease in narcotics/sedatives as well as initiation of HD 12. New ESRD: HD dependent due to progression of AKI. Has been accepted by [facility].  13. Pericardial effusion/Pleural effusion:  S/p pericardial window 12/2. S/p thoracocentesis   2/5- sounds tight- ordered Duonebs q4 hours prn 14. Hepatic Steatosis: Abdominal pain with pruritis. Followed at [facility].               CMP ordered for tomorrow AM 15. Severe protein-calorie Malnutrition: Encourage po intake as able. Continue tube feeds.  16. Anemia of chronic disease:             CBC ordered for tomorrow AM  2/4- Hb up to 12.7k-  17. Anxiety disorder with panic attacks: On Zoloft with alprazolam tid prn.  18. Thrombocytopenia: Stable without signs of bleeding.              CBC ordered for tomorrow AM  2/4- 128k-will con't regimen and monitor 19. Hyponatremia  2/4- up to 133 from 124- will monitor    LOS: 2 days A FACE TO FACE EVALUATION WAS PERFORMED  Joanna Ali 08/25/2019, 8:46 AM

## 2019-08-26 ENCOUNTER — Inpatient Hospital Stay (HOSPITAL_COMMUNITY): Payer: Medicare Other

## 2019-08-26 ENCOUNTER — Inpatient Hospital Stay (HOSPITAL_COMMUNITY): Payer: Medicare Other | Admitting: Physical Therapy

## 2019-08-26 LAB — ACID FAST CULTURE WITH REFLEXED SENSITIVITIES (MYCOBACTERIA): Acid Fast Culture: NEGATIVE

## 2019-08-26 MED ORDER — HEPARIN SODIUM (PORCINE) 1000 UNIT/ML IJ SOLN
INTRAMUSCULAR | Status: AC
  Administered 2019-08-26: 1000 [IU]
  Filled 2019-08-26: qty 4

## 2019-08-26 NOTE — IPOC Note (Signed)
Overall Plan of Care 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 [facility])     Functional Problem List: Nursing Behavior, Bowel, Edema, Endurance, Medication Management, Motor, Nutrition, Pain, Safety, Skin Integrity  PT Balance, Edema, Endurance, Motor, Nutrition, Pain, Perception, Safety, Sensory, Skin Integrity  OT Balance, Cognition, Endurance, Motor, Nutrition, Pain, Skin Integrity  SLP    TR         Basic ADL's: OT Eating, Grooming, Bathing, Dressing, Toileting     Advanced  ADL's: OT       Transfers: PT Bed Mobility, Bed to Chair, Car, Manufacturing systems engineer, Metallurgist: PT Ambulation, Emergency planning/management officer, Stairs     Additional Impairments: OT Fuctional Use of Upper Extremity  SLP        TR      Anticipated Outcomes Item Anticipated Outcome  Self Feeding CS for PO intake, dependent for tube feeding  Swallowing      Basic self-care  min/mod A  Toileting  min a   Bathroom Transfers min A  Bowel/Bladder  remain continent of bowel and empty regularly. Patient is aneuric  Transfers  min A using LRAD  Locomotion  Min A using LRAD 25 ft  Communication     Cognition     Pain  less than 4  Safety/Judgment  Will remain free of falls, skin breakdown and infection while on rehab   Therapy Plan: PT Intensity: Minimum of 1-2 x/day ,45 to 90 minutes PT Frequency: Total of 15 hours over 7 days of combined therapies PT Duration Estimated Length of Stay: 2 weeks OT Frequency: Total of 15 hours over 7 days of combined therapies OT Duration/Estimated Length of Stay: 2 weeks     Due to the current  state of emergency, patients may not be receiving their 3-hours of Medicare-mandated therapy.   Team Interventions: Nursing Interventions Patient/Family Education, Pain Management, Dysphagia/Aspiration Precaution Training, Medication Management, Discharge Planning, Bowel Management, Skin Care/Wound Management, Psychosocial Support, Disease Management/Prevention  PT interventions Ambulation/gait training, Discharge planning, Functional mobility training, Psychosocial support, Therapeutic Activities, Visual/perceptual remediation/compensation, Balance/vestibular training, Disease management/prevention, Neuromuscular re-education, Skin care/wound management, Therapeutic Exercise, Wheelchair propulsion/positioning, DME/adaptive equipment instruction, Pain management, Splinting/orthotics, UE/LE Strength taining/ROM, Cognitive remediation/compensation, Community reintegration, Functional electrical stimulation, Patient/family education, IT trainer, UE/LE Coordination activities  OT Interventions Training and development officer, Self Care/advanced ADL retraining, Cognitive remediation/compensation, DME/adaptive equipment instruction, Pain management, Skin care/wound managment, UE/LE Strength taining/ROM, Patient/family education, UE/LE Coordination activities, Discharge planning, Functional mobility training, Psychosocial support, Therapeutic Activities  SLP Interventions    TR Interventions    SW/CM Interventions Discharge Planning, Psychosocial Support, Patient/Family Education   Barriers to Discharge MD  Medical stability, Home enviroment access/loayout, Incontinence, Wound care, Weight, Hemodialysis, Weight bearing restrictions, Nutritional means and CREST syndrome; scleroderma  Nursing      PT Home environment access/layout, Decreased caregiver support, Wound Care, Weight 4 STE home without rails, husband to provide 24/7 assist and neighbors available to assist PRN  OT Medical stability, Home  environment access/layout, Hemodialysis    SLP      SW       Team Discharge Planning: Destination: PT-Home ,OT- Home , SLP-  Projected Follow-up:  PT-Home health PT, OT-  Home health OT, SLP-  Projected Equipment Needs: PT- , OT- To be determined, SLP-  Equipment Details: PT-Has SPC, OT-hospital bed need anticipated Patient/family involved in discharge planning: PT- Patient, Family member/caregiver,  OT-Patient, SLP-   MD ELOS: 2 weeks Medical Rehab Prognosis:  Fair Assessment: Pt is a 68 yr old female with CREST syndrome and malignant HTN with new ESRD rapidly progressing to End stage renal disease; severe, severe protein-calorie malnutrition and failure to thrive, A Fib with RVR, leukopenia, s/p pericardial effusion and pericardial window; and hepatic steatosis.  Also has hyponatremia, lethargy, thrombocytopenia and hemorrhoids.   Goals Min assist   See Team Conference Notes for weekly updates to the plan of care

## 2019-08-26 NOTE — Progress Notes (Signed)
[facility] PHYSICAL MEDICINE & REHABILITATION PROGRESS NOTE   Subjective/Complaints:   Pt reports her stomach hurts sometimes after gets meds via PEG. However denies nausea-   Per her husband, he's concerned about her poor energy. Hasn't rebounded from HD the last time- still very sedated/lethargic.   ROS: denies SOB, CP, abd pain, N/V/C- has loose stools due to TFs; denies vision changes, has sore throat Objective:   No results found. Recent Labs    08/24/19 1315  WBC 3.7*  HGB 10.4*  HCT 36.1  PLT 119*   Recent Labs    08/24/19 1315  NA 130*  K 4.3  CL 90*  CO2 26  GLUCOSE 100*  BUN 119*  CREATININE 3.22*  CALCIUM 8.8*    Intake/Output Summary (Last 24 hours) at 08/26/2019 1224 Last data filed at 08/26/2019 0700 Gross per 24 hour  Intake 1454 ml  Output --  Net 1454 ml     Physical Exam: Vital Signs Blood pressure (!) 148/92, pulse 100, temperature 97.7 F (36.5 C), temperature source Oral, resp. rate 18, weight 40.8 kg, SpO2 100 %.   Physical Exam  Vitals reviewed and labs reviewed. Constitutional:  Emaciated. Cachetic; shiny skin due to scleroderma all over- like skin pulled tight; rolled up in ball on her side in bed with eyes closed; husband at bedside, nursing and OT in room, NAD HENT:  Head: Normocephalic and atraumatic.  Eyes: conjugate gaze when opens eyes  Neck: No tracheal deviation present.   CV: RRR Respiratory: decreased air movement throughout but taking shallow breaths- O2 sats 100%  GI: soft, NT, ND, (+)BS hypoactive; getting TF via PEG Musculoskeletal:     Comments: No edema or tenderness in extremities  Neurological: She is alert.  Motor: Bilateral upper extremities: 3+-4-/5 proximal distal Bilateral lower extremities: Hip flexion 3+-4-/5, knee extension 3 -/5 (limited due to contractures), ankle dorsiflexion 3-3+/5 Sensation intact light touch  Skin:  Scattered abrasions Peripheral cyanosis - very cold hands and feet Psychiatric:  Her affect is blunt. Her speech is delayed. She is slowed.     Assessment/Plan: 1. Functional deficits secondary to debility due to pericardia windown and pericardial effusion/ Scleroderma/Raynaud's/new ESRD on HD which require 3+ hours per day of interdisciplinary therapy in a comprehensive inpatient rehab setting.  Physiatrist is providing close team supervision and 24 hour management of active medical problems listed below.  Physiatrist and rehab team continue to assess barriers to discharge/monitor patient progress toward functional and medical goals  Care Tool:  Bathing    Body parts bathed by patient: Chest, Right upper leg, Left upper leg, Face, Left arm, Right arm   Body parts bathed by helper: Right arm, Left arm, Chest, Abdomen, Front perineal area, Buttocks, Right upper leg, Left upper leg, Right lower leg, Left lower leg, Face     Bathing assist Assist Level: Total Assistance - Patient < 25%     Upper Body Dressing/Undressing Upper body dressing Upper body dressing/undressing activity did not occur (including orthotics): Safety/medical concerns What is the patient wearing?: Pull over shirt    Upper body assist Assist Level: Total Assistance - Patient < 25%    Lower Body Dressing/Undressing Lower body dressing      What is the patient wearing?: Pants, Incontinence brief     Lower body assist Assist for lower body dressing: Total Assistance - Patient < 25%     Toileting Toileting    Toileting assist Assist for toileting: Dependent - Patient 0%  Transfers Chair/bed transfer  Transfers assist     Chair/bed transfer assist level: Maximal Assistance - Patient 25 - 49%     Locomotion Ambulation   Ambulation assist   Ambulation activity did not occur: Safety/medical concerns(Limited due to lethargy and elevated BP)          Walk 10 feet activity   Assist  Walk 10 feet activity did not occur: Safety/medical concerns(Limited due to lethargy  and elevated BP)        Walk 50 feet activity   Assist Walk 50 feet with 2 turns activity did not occur: Safety/medical concerns(Limited due to lethargy and elevated BP)         Walk 150 feet activity   Assist Walk 150 feet activity did not occur: Safety/medical concerns(Limited due to lethargy and elevated BP)         Walk 10 feet on uneven surface  activity   Assist Walk 10 feet on uneven surfaces activity did not occur: Safety/medical concerns(Limited due to lethargy and elevated BP)         Wheelchair     Assist     Wheelchair activity did not occur: Safety/medical concerns(Limited due to lethargy and elevated BP)         Wheelchair 50 feet with 2 turns activity    Assist    Wheelchair 50 feet with 2 turns activity did not occur: Safety/medical concerns(Limited due to lethargy and elevated BP)       Wheelchair 150 feet activity     Assist  Wheelchair 150 feet activity did not occur: Safety/medical concerns(Limited due to lethargy and elevated BP)       Blood pressure (!) 148/92, pulse 100, temperature 97.7 F (36.5 C), temperature source Oral, resp. rate 18, weight 40.8 kg, SpO2 100 %.  Medical Problem List and Plan: 1.  Deficits with mobility, transfers, endurance, self-care secondary to debility.              -patient may may shower             -ELOS/Goals: 13-17 days/Supervision             Admit to CIR 2.  Antithrombotics: -DVT/anticoagulation:  Pharmaceutical: Heparin             -antiplatelet therapy: N/A 3. Peripheral neuropathy/Pain Management:  Tylenol bid with oxycodone prn.   4. Mood: Team to provide ego support. LCSW to follow for evaluation and support.              -antipsychotic agents: N/A 5. Neuropsych: This patient is not fully capable of making decisions on her own behalf. 6. Skin/Wound Care: Stage III sacral decub as well as unstagable wound below nares- doing bacitracin- IF no bette rin 7 days, will consult  Plastics - Santyl with damp to dry dressing for sacral decub. Add Vitamin C/Zinc for healing. ON tube feeds for nutritional support due to FTT. Air mattress overlay.  7. Fluids/Electrolytes/Nutrition: Strict I/O with daily weights. Regal diet with 1500 cc FR.  8. 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 Cont Cardizem. 9. A fib with RVR: [facility]. Monitor HR tid --rate controlled on Cardizem.             Monitor with increased mobility.  10. Leucopenia: Stable--follow up with Heme/onc for work up after discharge.  CBC ordered for tomorrow.  2/4- WBC is 3.3- will monitor 11. Metabolic encephalopathy: Treated with IV thiamine and has improved with decrease in narcotics/sedatives as well as initiation of HD 12. New ESRD: HD dependent due to progression of AKI. Has been [facility].   2/6- Renal hasn't pursued perm access due to Raynaud's comorbidities.  13. Pericardial effusion/Pleural effusion:  S/p pericardial window 12/2. S/p thoracocentesis   2/5- sounds tight- ordered Duonebs q4 hours prn  2/6- sats 100% 14. Hepatic Steatosis: Abdominal pain with pruritis. Followed at [facility].               CMP ordered for tomorrow AM 15. Severe protein-calorie Malnutrition: Encourage po intake as able. Continue tube feeds.  16. Anemia of chronic disease:             CBC ordered for tomorrow AM  2/4- Hb up to 12.7k-  17. Anxiety disorder with panic attacks: On Zoloft with alprazolam tid prn.  18. Thrombocytopenia: Stable without signs of bleeding.              CBC ordered for tomorrow AM  2/4- 128k-will con't regimen and monitor 19. Hyponatremia  2/4- up to 133 from 124- will monitor 20- Lethargy/poor energy- can be worse with Xanax- will monitor 21. Hemorrhoids  2/6- ordered Anusol suppositories for hemorrhoids.      LOS: 3 days A FACE TO FACE EVALUATION WAS PERFORMED  Admir Candelas 08/26/2019, 12:24 PM

## 2019-08-26 NOTE — Progress Notes (Addendum)
Physical Therapy Session Note  Patient Details  Name: Joanna Ali MRN: [id number] Date of Birth: October 10, 1950  Today's Date: 08/26/2019 PT Individual Time: 1105-1130 PT Individual Time Calculation (min): 25 min   Short Term Goals: Week 1:  PT Short Term Goal 1 (Week 1): Patient will peroform bed mobility with CGA consistently. PT Short Term Goal 2 (Week 1): Patient will perfomr transfers with mod A using LRAD. PT Short Term Goal 3 (Week 1): Patient with initiate gait training.  Skilled Therapeutic Interventions/Progress Updates:   Pt received supine in bed, asleep, with husband present. Pt aroused with effort. PT assessed orthostatic BP. 153/98  Supine. Sitting 171/94. Supine>sit transfer with mod assist at trunk and cues for log roll technique. Sitting balance EOB with supervision assist. Pt initially reports no nausea. Pt attempted sit<>stand at EOB but unable to complete due to onset of nausea.  After 5 minutes sitting EOB, pt requesting to lay back down with increasing nausea. Once in supine BP re-assessed 156/95. Once in supine pt fell asleep. Pt left in bed with call bell in reach and husband present. HR 89-91 throughout treatment.       Therapy Documentation Precautions:  Precautions Precautions: Fall Precaution Comments: PEG tube, sacral wound, watch BP with mobility, maintain systolic <707 mmHg Restrictions Weight Bearing Restrictions: No General: PT Amount of Missed Time (min): 35 Minutes Vital Signs:  see above.  Pain: Faces: hurts a lot. Pt repositioned    Therapy/Group: Individual Therapy  Lorie Phenix 08/26/2019, 11:29 AM

## 2019-08-26 NOTE — Progress Notes (Signed)
[facility] NEPHROLOGY PROGRESS NOTE  Assessment/ Plan: Pt is a 69 y.o. yo female scleroderma with crest syndrome, AKI now progressed to ESRD.  # AKI, HD dependent now progressed to ESRD requiring HD since 07/17/20.  Remains anuric.   HD per TTS schedule.  Plan for next hemodialysis today. Have not pursued perm access d/t comorbidies and Raynaud's  # Likely Scleroderma with CREST syndrome: on prednisone and CCB for Raynaud's.    #Anemia of CKD: Hemoglobin at goal.  Continue to monitor.  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  Continue Auryxia.  # Pericardial effusion: s/p pericardial window 12/23.  #  Dispo: in CIR.   Subjective: Seen and examined at bedside.  No new event.  Denies nausea vomiting chest pain or shortness of breath. Objective Vital signs in last 24 hours: Vitals:   08/25/19 0510 08/25/19 1400 08/25/19 1929 08/26/19 0322  BP: (!) 140/99 (!) 144/89 (!) 156/92 (!) 148/92  Pulse: 96 91 86 100  Resp: 20 17 18 18   Temp: 98 F (36.7 C) (!) 96.7 F (35.9 C) 97.7 F (36.5 C) 97.7 F (36.5 C)  TempSrc: Oral  Oral Oral  SpO2: 98%   100%  Weight: 38.6 kg   40.8 kg   Weight change: 1.824 kg  Intake/Output Summary (Last 24 hours) at 08/26/2019 0943 Last data filed at 08/26/2019 0700 Gross per 24 hour  Intake 1454 ml  Output --  Net 1454 ml       Labs: Basic Metabolic Panel: Recent Labs  Lab 08/22/19 0316 08/23/19 0259 08/24/19 1315  NA 124* 133* 130*  K 5.3* 4.7 4.3  CL 83* 93* 90*  CO2 23 24 26   GLUCOSE 106* 122* 100*  BUN 142* 72* 119*  CREATININE 3.83* 2.33* 3.22*  CALCIUM 9.1 8.8* 8.8*  PHOS 4.7* 2.8 3.1   Liver Function Tests: Recent Labs  Lab 08/22/19 0316 08/23/19 0259 08/24/19 1315  ALBUMIN 2.8* 2.8* 2.5*   No results for input(s): LIPASE, AMYLASE in the last 168 hours. No results for input(s): AMMONIA in the last 168 hours. CBC: Recent Labs  Lab 08/22/19 0316 08/23/19 0259 08/24/19 1315  WBC 3.1* 3.3* 3.7*  HGB 11.8*  12.7 10.4*  HCT 38.8 44.2 36.1  MCV 90.0 95.5 95.5  PLT 127* 128* 119*   Cardiac Enzymes: No results for input(s): CKTOTAL, CKMB, CKMBINDEX, TROPONINI in the last 168 hours. CBG: Recent Labs  Lab 08/22/19 1932 08/22/19 2344 08/23/19 0324 08/23/19 1433 08/23/19 1648  GLUCAP 143* 106* 125* 128* 125*    Iron Studies: No results for input(s): IRON, TIBC, TRANSFERRIN, FERRITIN in the last 72 hours. Studies/Results: No results found.  Medications: Infusions: . feeding supplement (NEPRO CARB STEADY) 1,000 mL (08/26/19 0032)    Scheduled Medications: . acetaminophen  500 mg Oral BID  . ascorbic acid  500 mg Per Tube BID  . Chlorhexidine Gluconate Cloth  6 each Topical Q0600  . diclofenac Sodium  2 g Topical BID  . diltiazem  120 mg Oral Daily  . famotidine  10 mg Oral Daily  . feeding supplement (PRO-STAT SUGAR FREE 64)  30 mL Per Tube Daily  . ferric citrate  420 mg Oral TID WC  . fluconazole  100 mg Oral Daily  . gabapentin  100 mg Per Tube Q12H  . Gerhardt's butt cream   Topical TID  . heparin  5,000 Units Subcutaneous Q8H  . hydrocortisone  25 mg Rectal BID  . loratadine  10 mg Oral Daily  . mouth  rinse  15 mL Mouth Rinse BID  . multivitamin  1 tablet Oral QHS  . neomycin-bacitracin-polymyxin   Topical BID  . nystatin  5 mL Mouth/Throat QID  . pantoprazole sodium  40 mg Per Tube BID  . predniSONE  50 mg Oral Q breakfast   Followed by  . [START ON 09/07/2019] predniSONE  40 mg Oral Q breakfast   Followed by  . [START ON 09/15/2019] predniSONE  30 mg Oral Q breakfast  . sertraline  25 mg Oral BID  . thiamine  100 mg Oral Daily  . zinc sulfate  220 mg Oral Daily    have reviewed scheduled and prn medications.  Physical Exam: General: Ill-looking cachectic female lying in bed, not in distress. Heart:RRR, s1s2 nl Lungs:clear b/l, no crackle Abdomen:soft, Non-tender, G-tube in place Extremities:No edema Dialysis Access: HD catheter.  Jhase Creppel Prasad  Marty Uy 08/26/2019,9:43 AM  LOS: 3 days  Pager: [phone]

## 2019-08-26 NOTE — Progress Notes (Signed)
Occupational Therapy Session Note  Patient Details  Name: Joanna Ali MRN: [id number] Date of Birth: 14-Jul-1951  Today's Date: 08/26/2019 OT Individual Time: 1587-2761 OT Individual Time Calculation (min): 40 min    Short Term Goals: Week 1:  OT Short Term Goal 1 (Week 1): Patient will complete bed mobility with CS OT Short Term Goal 2 (Week 1): patient will complete sit to stand and SPT with mod A OT Short Term Goal 3 (Week 1): patient will complete UB bathing/dressing with mod A OT Short Term Goal 4 (Week 1): Patient will tolerate sitting upright for 1 hour and perform weight shifts with min a  Skilled Therapeutic Interventions/Progress Updates:    Pt received supine, very fatigued and ill appearing. Pt's husband present throughout session and involved. Pt completed sidelying > sitting EOB with max +2 assist. Pt able to maintain EOB sitting balance with (S) initially, fading to min A. Pt cued for upright posture and head lifting. Pt reporting nausea but no dizziness, with husband reporting this has been occurring and pt usually has no emesis. Pt assisted with washing face and brushing hair, max A overall. All VSS on RA. Pt doffed shirt with mod A. Max A to don shirt. Pt took a sip of coke and had several drops come out of her nose. Pt proceeded to have very small amount of mucus appearing emesis. She was transferred back to bed with max +2 assist. Pt's husband reporting maybe this is from tube feed being administered quickly. From supine pt was boosted up in bed to ensure proper positioning. Pt was left sidelying with all needs met, husband present. Pillows positioned to ensure skin integrity maintained.   Therapy Documentation Precautions:  Precautions Precautions: Fall Precaution Comments: PEG tube, sacral wound, watch BP with mobility, maintain systolic <848 mmHg Restrictions Weight Bearing Restrictions: No   Therapy/Group: Individual Therapy  Curtis Sites 08/26/2019, 7:15 AM

## 2019-08-27 ENCOUNTER — Inpatient Hospital Stay (HOSPITAL_COMMUNITY): Payer: Medicare Other | Admitting: Occupational Therapy

## 2019-08-27 ENCOUNTER — Inpatient Hospital Stay (HOSPITAL_COMMUNITY): Payer: Medicare Other | Admitting: Physical Therapy

## 2019-08-27 LAB — OCCULT BLOOD X 1 CARD TO LAB, STOOL: Fecal Occult Bld: POSITIVE — AB

## 2019-08-27 NOTE — Progress Notes (Signed)
Physical Therapy Session Note  Patient Details  Name: Joanna Ali MRN: [id number] Date of Birth: 05/27/1951  Today's Date: 08/27/2019 PT Individual Time: 1000-1033 PT Individual Time Calculation (min): 33 min   Short Term Goals: Week 1:  PT Short Term Goal 1 (Week 1): Patient will peroform bed mobility with CGA consistently. PT Short Term Goal 2 (Week 1): Patient will perfomr transfers with mod A using LRAD. PT Short Term Goal 3 (Week 1): Patient with initiate gait training.  Skilled Therapeutic Interventions/Progress Updates:  Pt was seen bedside in the am. Pt c/o nausea but requiesting to sit on edge of bed. Pt transferred to edge of bed with mod A and verbal cues. Pt tolerated edge of bed about 5 minutes with c/g to min A and verbal cues. Pt transferred edge of bed to supine with mod to max A and verbal cues. Pt rolled R/L with side rails and mod to max A to assist with hygiene. Pt dependent with hygiene and donning brief. Small black colored stool notified pt's nurse. Pt's husband at bedside for second half of treatment. Pt requested to sit up again. Pt transferred supine to edge of bed with side rail and mod to max A with verbal cues. Pt tolerated edge of bed about 5 minutes with S to c/g. Pt c/o coccyx pain. Pt returned to supine with mod to max A and verbal cues. Pt dependent to move up in bed. Pt positioned in R sidelying with pillow between LEs and at back. Pt appears comfortable with call bell within reach and husband at bedside.   Therapy Documentation Precautions:  Precautions Precautions: Fall Precaution Comments: PEG tube, sacral wound, watch BP with mobility, maintain systolic <007 mmHg Restrictions Weight Bearing Restrictions: No General: PT Amount of Missed Time (min): 27 Minutes PT Missed Treatment Reason: Patient fatigue;Pain;Patient ill (Comment) Vital Signs:  Pain: Pt c/o pain coccyx when WB on coccyx.    Therapy/Group: Individual Therapy  Dub Amis 08/27/2019, 10:55 AM

## 2019-08-27 NOTE — Progress Notes (Signed)
[place] PHYSICAL MEDICINE & REHABILITATION PROGRESS NOTE   Subjective/Complaints:   Pt reports still having very low energy- and exhausted- will check thyroid-TSH since could be adding to lethargy in addition to CREST syndrome.   Was told pt had black stools this AM- will check hemoccult. Was positive.  Will be checking labs on Monday AM to see if Hb is dropping- if so will call GI.    ROS: denies SOB, CP, abd pain, N/V/C- has loose stools due to TFs; denies vision changes, has sore throat- slightly better Objective:   No results found. Recent Labs    08/24/19 1315  WBC 3.7*  HGB 10.4*  HCT 36.1  PLT 119*   Recent Labs    08/24/19 1315  NA 130*  K 4.3  CL 90*  CO2 26  GLUCOSE 100*  BUN 119*  CREATININE 3.22*  CALCIUM 8.8*    Intake/Output Summary (Last 24 hours) at 08/27/2019 1244 Last data filed at 08/27/2019 0730 Gross per 24 hour  Intake 330 ml  Output 2000 ml  Net -1670 ml     Physical Exam: Vital Signs Blood pressure (!) 155/95, pulse 93, temperature 97.7 F (36.5 C), temperature source Axillary, resp. rate 18, weight 41 kg, SpO2 100 %.   Physical Exam  Vitals reviewed and labs reviewed. Constitutional:  Emaciated. Cachetic; shiny skin due to scleroderma all over- like skin pulled tight; supine; getting TFs/meds via nursing; c/o temporary abd pain, not nausea with med admin via PEG;  NAD HENT:  Head: Normocephalic and atraumatic.  Eyes: conjugate gaze when opens eyes  Neck: No tracheal deviation present.   CV: RRR Respiratory:CTA B/L- not coarse or have wheezes, but decreased breath sounds- shallow breathing of note GI: soft, NT, ND, (+)BS hyperactive as getting meds; getting TF via PEG Musculoskeletal:     Comments: No edema or tenderness in extremities  Neurological: She is alert.  Motor: Bilateral upper extremities: 3+-4-/5 proximal distal Bilateral lower extremities: Hip flexion 3+-4-/5, knee extension 3 -/5 (limited due to contractures),  ankle dorsiflexion 3-3+/5 Sensation intact light touch  Skin:  Scattered abrasions Peripheral cyanosis - very cold hands and feet- appears blue tinged Psychiatric: Her affect is blunt. Her speech is delayed. She is slowed.     Assessment/Plan: 1. Functional deficits secondary to debility due to pericardia windown and pericardial effusion/ Scleroderma/Raynaud's/new ESRD on HD which require 3+ hours per day of interdisciplinary therapy in a comprehensive inpatient rehab setting.  Physiatrist is providing close team supervision and 24 hour management of active medical problems listed below.  Physiatrist and rehab team continue to assess barriers to discharge/monitor patient progress toward functional and medical goals  Care Tool:  Bathing    Body parts bathed by patient: Chest, Right upper leg, Left upper leg, Face, Left arm, Right arm   Body parts bathed by helper: Right arm, Left arm, Chest, Abdomen, Front perineal area, Buttocks, Right upper leg, Left upper leg, Right lower leg, Left lower leg, Face     Bathing assist Assist Level: Total Assistance - Patient < 25%     Upper Body Dressing/Undressing Upper body dressing Upper body dressing/undressing activity did not occur (including orthotics): Safety/medical concerns What is the patient wearing?: Pull over shirt    Upper body assist Assist Level: Total Assistance - Patient < 25%    Lower Body Dressing/Undressing Lower body dressing      What is the patient wearing?: Pants, Incontinence brief     Lower body assist Assist for  lower body dressing: Total Assistance - Patient < 25%     Toileting Toileting    Toileting assist Assist for toileting: Dependent - Patient 0%     Transfers Chair/bed transfer  Transfers assist     Chair/bed transfer assist level: Maximal Assistance - Patient 25 - 49%     Locomotion Ambulation   Ambulation assist   Ambulation activity did not occur: Safety/medical concerns(Limited  due to lethargy and elevated BP)          Walk 10 feet activity   Assist  Walk 10 feet activity did not occur: Safety/medical concerns(Limited due to lethargy and elevated BP)        Walk 50 feet activity   Assist Walk 50 feet with 2 turns activity did not occur: Safety/medical concerns(Limited due to lethargy and elevated BP)         Walk 150 feet activity   Assist Walk 150 feet activity did not occur: Safety/medical concerns(Limited due to lethargy and elevated BP)         Walk 10 feet on uneven surface  activity   Assist Walk 10 feet on uneven surfaces activity did not occur: Safety/medical concerns(Limited due to lethargy and elevated BP)         Wheelchair     Assist     Wheelchair activity did not occur: Safety/medical concerns(Limited due to lethargy and elevated BP)         Wheelchair 50 feet with 2 turns activity    Assist    Wheelchair 50 feet with 2 turns activity did not occur: Safety/medical concerns(Limited due to lethargy and elevated BP)       Wheelchair 150 feet activity     Assist  Wheelchair 150 feet activity did not occur: Safety/medical concerns(Limited due to lethargy and elevated BP)       Blood pressure (!) 155/95, pulse 93, temperature 97.7 F (36.5 C), temperature source Axillary, resp. rate 18, weight 41 kg, SpO2 100 %.  Medical Problem List and Plan: 1.  Deficits with mobility, transfers, endurance, self-care secondary to debility.              -patient may may shower             -ELOS/Goals: 13-17 days/Supervision             Admit to CIR 2.  Antithrombotics: -DVT/anticoagulation:  Pharmaceutical: Heparin             -antiplatelet therapy: N/A 3. Peripheral neuropathy/Pain Management:  Tylenol bid with oxycodone prn.   4. Mood: Team to provide ego support. LCSW to follow for evaluation and support.              -antipsychotic agents: N/A 5. Neuropsych: This patient is not fully capable of making  decisions on her own behalf. 6. 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 Add Vitamin C/Zinc for healing. ON tube feeds for nutritional support due to FTT. Air mattress overlay.  7. Fluids/Electrolytes/Nutrition: Strict I/O with daily weights. Regal diet with 1500 cc FR.  8. 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 Cont Cardizem. 9. A fib with RVR: Not felt to be a [facility] to Raynaurds. Monitor HR tid --rate controlled on Cardizem.  Monitor with increased mobility.  10. Leucopenia: Stable--follow up with Heme/onc for work up after discharge.                    CBC ordered for tomorrow.  2/4- WBC is 3.3- will monitor 11. Metabolic encephalopathy: Treated with IV thiamine and has improved with decrease in narcotics/sedatives as well as initiation of HD  2/7- will add TSH to AM labs to see if could be why so sedated/lethargic 12. New ESRD: HD dependent due to progression of AKI. Has been accepted by [facility].   2/6- Renal hasn't pursued perm access due to Raynaud's comorbidities.  13. Pericardial effusion/Pleural effusion:  S/p pericardial window 12/2. S/p thoracocentesis   2/5- sounds tight- ordered Duonebs q4 hours prn  2/6- sats 100% 14. Hepatic Steatosis: Abdominal pain with pruritis. Followed at [facility].               CMP ordered for tomorrow AM  Ordered renal function panel since on HD- will add LFTs to this for Monday 15. Severe protein-calorie Malnutrition: Encourage po intake as able. Continue tube feeds.  16. Anemia of chronic disease:             CBC ordered for tomorrow AM  2/4- Hb up to 12.7k-  17. Anxiety disorder with panic attacks: On Zoloft with alprazolam tid prn.  18. Thrombocytopenia:  Stable without signs of bleeding.              CBC ordered for tomorrow AM  2/4- 128k-will con't regimen and monitor 19. Hyponatremia  2/4- up to 133 from 124- will monitor 20- Lethargy/poor energy- can be worse with Xanax- will monitor  2/7- will also check TSH as well.  21. Hemorrhoids/Fecal Hemeoccult (+)  2/6- ordered Anusol suppositories for hemorrhoids.   2/7- hemeoccult (+)- could be due to hemorrhoids, however will recheck Hb in AM- if dropping, need to consult GI.     LOS: 4 days A FACE TO FACE EVALUATION WAS PERFORMED  Zelig Gacek 08/27/2019, 12:44 PM

## 2019-08-27 NOTE — Progress Notes (Signed)
[facility] NEPHROLOGY PROGRESS NOTE  Assessment/ Plan: Pt is a 69 y.o. yo female scleroderma with crest syndrome, AKI now progressed to ESRD.  # AKI, HD dependent now progressed to ESRD requiring HD since 07/17/20.  Remains anuric.   HD per TTS schedule, HD yesterday with 2000 cc UF.  Tolerated well.  Next HD on 2/9. Have not pursued perm access d/t comorbidies and Raynaud's  # Likely Scleroderma with CREST syndrome: on prednisone and CCB for Raynaud's.    #Anemia of CKD: Hemoglobin at goal.  Continue to monitor.  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  Continue Auryxia.  # Pericardial effusion: s/p pericardial window 12/23.  #  Dispo: in CIR.   Subjective: Seen and examined at bedside.  No new event.  Complains of nausea and abdominal discomfort.  Discussed with the nursing staff.  Objective Vital signs in last 24 hours: Vitals:   08/26/19 1800 08/26/19 1818 08/26/19 2040 08/27/19 0327  BP: 110/70 140/75 (!) 159/89 (!) 155/95  Pulse: 92 96 99 93  Resp: 18 18 20 18   Temp:  97.6 F (36.4 C) 97.7 F (36.5 C) 97.7 F (36.5 C)  TempSrc:  Axillary Axillary Axillary  SpO2:  100% (!) 78% 100%  Weight:    41 kg   Weight change: 0.176 kg  Intake/Output Summary (Last 24 hours) at 08/27/2019 0933 Last data filed at 08/27/2019 0730 Gross per 24 hour  Intake 330 ml  Output 2000 ml  Net -1670 ml       Labs: Basic Metabolic Panel: Recent Labs  Lab 08/22/19 0316 08/23/19 0259 08/24/19 1315  NA 124* 133* 130*  K 5.3* 4.7 4.3  CL 83* 93* 90*  CO2 23 24 26   GLUCOSE 106* 122* 100*  BUN 142* 72* 119*  CREATININE 3.83* 2.33* 3.22*  CALCIUM 9.1 8.8* 8.8*  PHOS 4.7* 2.8 3.1   Liver Function Tests: Recent Labs  Lab 08/22/19 0316 08/23/19 0259 08/24/19 1315  ALBUMIN 2.8* 2.8* 2.5*   No results for input(s): LIPASE, AMYLASE in the last 168 hours. No results for input(s): AMMONIA in the last 168 hours. CBC: Recent Labs  Lab 08/22/19 0316 08/23/19 0259  08/24/19 1315  WBC 3.1* 3.3* 3.7*  HGB 11.8* 12.7 10.4*  HCT 38.8 44.2 36.1  MCV 90.0 95.5 95.5  PLT 127* 128* 119*   Cardiac Enzymes: No results for input(s): CKTOTAL, CKMB, CKMBINDEX, TROPONINI in the last 168 hours. CBG: Recent Labs  Lab 08/22/19 1932 08/22/19 2344 08/23/19 0324 08/23/19 1433 08/23/19 1648  GLUCAP 143* 106* 125* 128* 125*    Iron Studies: No results for input(s): IRON, TIBC, TRANSFERRIN, FERRITIN in the last 72 hours. Studies/Results: No results found.  Medications: Infusions: . feeding supplement (NEPRO CARB STEADY) 50 mL/hr at 08/27/19 0500    Scheduled Medications: . acetaminophen  500 mg Oral BID  . ascorbic acid  500 mg Per Tube BID  . Chlorhexidine Gluconate Cloth  6 each Topical Q0600  . diclofenac Sodium  2 g Topical BID  . diltiazem  120 mg Oral Daily  . famotidine  10 mg Oral Daily  . feeding supplement (PRO-STAT SUGAR FREE 64)  30 mL Per Tube Daily  . ferric citrate  420 mg Oral TID WC  . fluconazole  100 mg Oral Daily  . gabapentin  100 mg Per Tube Q12H  . Gerhardt's butt cream   Topical TID  . heparin  5,000 Units Subcutaneous Q8H  . hydrocortisone  25 mg Rectal BID  . loratadine  10 mg Oral Daily  . mouth rinse  15 mL Mouth Rinse BID  . multivitamin  1 tablet Oral QHS  . neomycin-bacitracin-polymyxin   Topical BID  . nystatin  5 mL Mouth/Throat QID  . pantoprazole sodium  40 mg Per Tube BID  . predniSONE  50 mg Oral Q breakfast   Followed by  . [START ON 09/09/2019] predniSONE  40 mg Oral Q breakfast   Followed by  . [START ON 08/27/2019] predniSONE  30 mg Oral Q breakfast  . sertraline  25 mg Oral BID  . thiamine  100 mg Oral Daily  . zinc sulfate  220 mg Oral Daily    have reviewed scheduled and prn medications.  Physical Exam: General: Ill-looking cachectic female lying in bed, not in distress. Heart:RRR, s1s2 nl Lungs: Clear b/l, no crackle Abdomen:soft, Non-tender, G-tube in place Extremities:No edema Dialysis  Access: HD catheter.  Demaurion Dicioccio Prasad Bakari Nikolai 08/27/2019,9:33 AM  LOS: 4 days  Pager: [phone]

## 2019-08-27 NOTE — Progress Notes (Addendum)
Occupational Therapy Session Note  Patient Details  Name: [facility] Ali MRN: [id number] Date of Birth: 20-Oct-1950  Today's Date: 08/27/2019 OT Individual Time: 4332-9518 OT Individual Time Calculation (min): 40 min  35 minutes missed  Short Term Goals: Week 1:  OT Short Term Goal 1 (Week 1): Patient will complete bed mobility with CS OT Short Term Goal 2 (Week 1): patient will complete sit to stand and SPT with mod A OT Short Term Goal 3 (Week 1): patient will complete UB bathing/dressing with mod A OT Short Term Goal 4 (Week 1): Patient will tolerate sitting upright for 1 hour and perform weight shifts with min a    Skilled Therapeutic Interventions/Progress Updates:    Pt greeted in bed with spouse present. She appeared very lethargic with quiet voice and eyes rolling in back of head. Per spouse, this is normal since CIR admission. 02 sats 82-89% on RA, noted sats were being taken by ear. Notified RN and she changed pt over to forehead monitor. Pt satted at 99-100% throughout session afterwards. While OT conversed with pt and spouse, pt quietly stated "I want to get up." Max A for supine<sit and Min A for sitting balance EOB. Pt reported feeling sick, had emesis bag handy however pt with no vomiting during session. Encouraged pt to eat some lunch as spouse stated she has been eating very little, tried to facilitate [facility] with built up utensils. Spouse encouraged her as well, and after several minutes pt still refused to eat. Spouse brushed her hair with pt refusing to attempt herself, perseverating on feeling sick with head down. Encouraged pt to engage in self care tasks, OOB or bedlevel therapeutic activity to make her feel better. However she requested to return to bed to rest. Max A for sit<supine and +2 for boosting her up in bed. Positioned pt in sidelying for pressure relief off of wound. Educated spouse on pressure relief positioning. She remained in bed with all needs within reach and spouse  present. Asleep before OT departure. Time missed due to pt fatigue.   Provided her with an antinausea aromatherapy blend during session   Therapy Documentation Precautions:  Precautions Precautions: Fall Precaution Comments: PEG tube, sacral wound, watch BP with mobility, maintain systolic <841 mmHg Restrictions Weight Bearing Restrictions: No Pain: Buttocks, repositioning interventions used to address as stated above   ADL: ADL Eating: Minimal assistance Where Assessed-Eating: Bed level Grooming: Maximal assistance Where Assessed-Grooming: Sitting at sink, Wheelchair Upper Body Bathing: Maximal assistance Where Assessed-Upper Body Bathing: Wheelchair, Sitting at sink Lower Body Bathing: Dependent Where Assessed-Lower Body Bathing: Bed level Upper Body Dressing: Maximal assistance Where Assessed-Upper Body Dressing: Wheelchair, Sitting at sink Lower Body Dressing: Dependent Where Assessed-Lower Body Dressing: Bed level Toileting: Dependent Where Assessed-Toileting: Bedside Commode Toilet Transfer: Maximal assistance Toilet Transfer Method: Stand pivot Toilet Transfer Equipment: Bedside commode      Therapy/Group: Individual Therapy  Creedon Danielski A Bookert Guzzi 08/27/2019, 2:51 PM

## 2019-08-28 ENCOUNTER — Other Ambulatory Visit (HOSPITAL_COMMUNITY): Payer: Medicare Other

## 2019-08-28 ENCOUNTER — Inpatient Hospital Stay (HOSPITAL_COMMUNITY): Payer: Medicare Other

## 2019-08-28 ENCOUNTER — Encounter (HOSPITAL_COMMUNITY): Payer: Medicare Other | Admitting: Psychology

## 2019-08-28 ENCOUNTER — Encounter (HOSPITAL_COMMUNITY): Payer: Self-pay | Admitting: Physical Medicine and Rehabilitation

## 2019-08-28 ENCOUNTER — Inpatient Hospital Stay (HOSPITAL_COMMUNITY): Payer: Medicare Other | Admitting: Occupational Therapy

## 2019-08-28 DIAGNOSIS — R195 Other fecal abnormalities: Secondary | ICD-10-CM | POA: Diagnosis not present

## 2019-08-28 DIAGNOSIS — N186 End stage renal disease: Secondary | ICD-10-CM

## 2019-08-28 DIAGNOSIS — Z992 Dependence on renal dialysis: Secondary | ICD-10-CM

## 2019-08-28 DIAGNOSIS — M340 Progressive systemic sclerosis: Secondary | ICD-10-CM

## 2019-08-28 LAB — CBC WITH DIFFERENTIAL/PLATELET
Abs Immature Granulocytes: 0.1 10*3/uL — ABNORMAL HIGH (ref 0.00–0.07)
Basophils Absolute: 0 10*3/uL (ref 0.0–0.1)
Basophils Relative: 0 %
Eosinophils Absolute: 0.1 10*3/uL (ref 0.0–0.5)
Eosinophils Relative: 1 %
HCT: 41.1 % (ref 36.0–46.0)
Hemoglobin: 12 g/dL (ref 12.0–15.0)
Immature Granulocytes: 2 %
Lymphocytes Relative: 14 %
Lymphs Abs: 0.9 10*3/uL (ref 0.7–4.0)
MCH: 28 pg (ref 26.0–34.0)
MCHC: 29.2 g/dL — ABNORMAL LOW (ref 30.0–36.0)
MCV: 95.8 fL (ref 80.0–100.0)
Monocytes Absolute: 0.7 10*3/uL (ref 0.1–1.0)
Monocytes Relative: 11 %
Neutro Abs: 4.7 10*3/uL (ref 1.7–7.7)
Neutrophils Relative %: 72 %
Platelets: 148 10*3/uL — ABNORMAL LOW (ref 150–400)
RBC: 4.29 MIL/uL (ref 3.87–5.11)
RDW: 21.3 % — ABNORMAL HIGH (ref 11.5–15.5)
WBC: 6.3 10*3/uL (ref 4.0–10.5)
nRBC: 1 % — ABNORMAL HIGH (ref 0.0–0.2)

## 2019-08-28 LAB — RENAL FUNCTION PANEL
Albumin: 2.8 g/dL — ABNORMAL LOW (ref 3.5–5.0)
Anion gap: 17 — ABNORMAL HIGH (ref 5–15)
BUN: 81 mg/dL — ABNORMAL HIGH (ref 8–23)
CO2: 20 mmol/L — ABNORMAL LOW (ref 22–32)
Calcium: 9.2 mg/dL (ref 8.9–10.3)
Chloride: 88 mmol/L — ABNORMAL LOW (ref 98–111)
Creatinine, Ser: 2.31 mg/dL — ABNORMAL HIGH (ref 0.44–1.00)
GFR calc Af Amer: 24 mL/min — ABNORMAL LOW (ref >60)
GFR calc non Af Amer: 21 mL/min — ABNORMAL LOW (ref >60)
Glucose, Bld: 79 mg/dL (ref 70–99)
Phosphorus: 2.5 mg/dL (ref 2.5–4.6)
Potassium: 5 mmol/L (ref 3.5–5.1)
Sodium: 125 mmol/L — ABNORMAL LOW (ref 135–145)

## 2019-08-28 LAB — LACTIC ACID, PLASMA
Lactic Acid, Venous: 2.1 mmol/L (ref 0.5–1.9)
Lactic Acid, Venous: 2.2 mmol/L (ref 0.5–1.9)
Lactic Acid, Venous: 3.9 mmol/L (ref 0.5–1.9)

## 2019-08-28 LAB — CBC
HCT: 37.5 % (ref 36.0–46.0)
Hemoglobin: 11.3 g/dL — ABNORMAL LOW (ref 12.0–15.0)
MCH: 28.2 pg (ref 26.0–34.0)
MCHC: 30.1 g/dL (ref 30.0–36.0)
MCV: 93.5 fL (ref 80.0–100.0)
Platelets: 146 10*3/uL — ABNORMAL LOW (ref 150–400)
RBC: 4.01 MIL/uL (ref 3.87–5.11)
RDW: 20.8 % — ABNORMAL HIGH (ref 11.5–15.5)
WBC: 4.8 10*3/uL (ref 4.0–10.5)
nRBC: 0.8 % — ABNORMAL HIGH (ref 0.0–0.2)

## 2019-08-28 LAB — HEPATIC FUNCTION PANEL
ALT: 25 U/L (ref 0–44)
AST: 52 U/L — ABNORMAL HIGH (ref 15–41)
Albumin: 2.9 g/dL — ABNORMAL LOW (ref 3.5–5.0)
Alkaline Phosphatase: 121 U/L (ref 38–126)
Bilirubin, Direct: 0.3 mg/dL — ABNORMAL HIGH (ref 0.0–0.2)
Indirect Bilirubin: 0.4 mg/dL (ref 0.3–0.9)
Total Bilirubin: 0.7 mg/dL (ref 0.3–1.2)
Total Protein: 5.7 g/dL — ABNORMAL LOW (ref 6.5–8.1)

## 2019-08-28 LAB — AMMONIA: Ammonia: 33 umol/L (ref 9–35)

## 2019-08-28 LAB — TSH: TSH: 2.259 u[IU]/mL (ref 0.350–4.500)

## 2019-08-28 LAB — PROCALCITONIN: Procalcitonin: 2.45 ng/mL

## 2019-08-28 MED ORDER — METHYLPREDNISOLONE SODIUM SUCC 125 MG IJ SOLR
80.0000 mg | Freq: Two times a day (BID) | INTRAMUSCULAR | Status: AC
  Administered 2019-08-28 – 2019-08-29 (×2): 80 mg via INTRAVENOUS
  Filled 2019-08-28 (×2): qty 2

## 2019-08-28 MED ORDER — HEPARIN SODIUM (PORCINE) 1000 UNIT/ML DIALYSIS: 20.0000 [IU]/kg | INTRAMUSCULAR | Status: DC | PRN

## 2019-08-28 MED ORDER — PROCHLORPERAZINE MALEATE 5 MG PO TABS: 5.0000 mg | ORAL_TABLET | Freq: Four times a day (QID) | ORAL | Status: DC | PRN

## 2019-08-28 MED ORDER — ALTEPLASE 2 MG IJ SOLR: 2.0000 mg | Freq: Once | INTRAMUSCULAR | Status: DC | PRN

## 2019-08-28 MED ORDER — SODIUM CHLORIDE 0.9 % IV SOLN: 100.0000 mL | INTRAVENOUS | Status: DC | PRN

## 2019-08-28 MED ORDER — LIDOCAINE HCL (PF) 1 % IJ SOLN: 5.0000 mL | INTRAMUSCULAR | Status: DC | PRN

## 2019-08-28 MED ORDER — HEPARIN SODIUM (PORCINE) 1000 UNIT/ML DIALYSIS: 1000.0000 [IU] | INTRAMUSCULAR | Status: DC | PRN

## 2019-08-28 MED ORDER — SODIUM CHLORIDE 0.9 % IV SOLN: INTRAVENOUS | Status: DC

## 2019-08-28 MED ORDER — ACETAMINOPHEN 160 MG/5ML PO SOLN
650.0000 mg | Freq: Four times a day (QID) | ORAL | Status: DC | PRN
  Administered 2019-08-28: 650 mg
  Filled 2019-08-28 (×2): qty 20.3

## 2019-08-28 MED ORDER — LIDOCAINE-PRILOCAINE 2.5-2.5 % EX CREA: 1.0000 "application " | TOPICAL_CREAM | CUTANEOUS | Status: DC | PRN

## 2019-08-28 MED ORDER — PENTAFLUOROPROP-TETRAFLUOROETH EX AERO: 1.0000 "application " | INHALATION_SPRAY | CUTANEOUS | Status: DC | PRN

## 2019-08-28 NOTE — Plan of Care (Signed)
  Problem: RH BOWEL ELIMINATION Goal: RH STG MANAGE BOWEL WITH ASSISTANCE Description: STG Manage Bowel with Assistance. Mod I Outcome: Progressing   Problem: RH SKIN INTEGRITY Goal: RH STG MAINTAIN SKIN INTEGRITY WITH ASSISTANCE Description: STG Maintain Skin Integrity With Assistance. Mod Outcome: Progressing Goal: RH STG ABLE TO PERFORM INCISION/WOUND CARE W/ASSISTANCE Description: STG Able To Perform Incision/Wound Care With Assistance. Mod assist Outcome: Progressing

## 2019-08-28 NOTE — Progress Notes (Signed)
Patient ID: [facility] Ali, female   DOB: May 16, 1951, 69 y.o.   MRN: [id number]  [facility] Progress Note   Assessment/ Plan:   1.  Altered mental status/encephalopathy: Labs from earlier today showed hyponatremia but with BUN clearly better than 4 days ago.  Her oral intake has been suboptimal since dialysis on Saturday and I will check an ammonia level today and consider CT scan of the head.  Medications reviewed; remains on prednisone with gradual taper. 2. ESRD: With dialysis dependent acute kidney injury now for the past 6 weeks or so.  Permanent dialysis access placement limited by her Raynaud's and evidence of vascular insufficiency with continued hemodialysis via TDC. 3. Anemia: Without overt blood loss, rising hemoglobin/hematocrit appears to be reflective of hemoconcentration. 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 5. Nutrition: With low albumin level and reported poor intake secondary to encephalopathy. 6. Hypertension: Blood pressure intermittently elevated  Subjective:   Husband at bedside reports decreased responsiveness/communication since dialysis on Saturday along with decreased oral intake.   Objective:   BP (!) 153/87 (BP Location: Left Arm)   Pulse 84   Temp (!) 96.8 F (36 C)   Resp 15   Wt 42 kg   SpO2 96%   BMI 14.94 kg/m   Physical Exam: Gen: Somnolent, very lethargic on awakening CVS: Pulse regular rhythm, normal rate, S1 and S2 normal Resp: Clear to auscultation, no rales/rhonchi Abd: Soft, flat, nontender Ext: 1+ bilateral lower extremity edema, right IJ TDC.  Labs: BMET Recent Labs  Lab 08/22/19 0316 08/23/19 0259 08/24/19 1315 08/28/19 0542  NA 124* 133* 130* 125*  K 5.3* 4.7 4.3 5.0  CL 83* 93* 90* 88*  CO2 23 24 26  20*  GLUCOSE 106* 122* 100* 79  BUN 142* 72* 119* 81*  CREATININE 3.83* 2.33* 3.22* 2.31*  CALCIUM 9.1 8.8* 8.8* 9.2  PHOS 4.7* 2.8 3.1 2.5   CBC Recent Labs  Lab  08/23/19 0259 08/24/19 1315 08/28/19 0037 08/28/19 0534  WBC 3.3* 3.7* 4.8 6.3  NEUTROABS  --   --   --  4.7  HGB 12.7 10.4* 11.3* 12.0  HCT 44.2 36.1 37.5 41.1  MCV 95.5 95.5 93.5 95.8  PLT 128* 119* 146* 148*      Medications:    . acetaminophen  500 mg Oral BID  . ascorbic acid  500 mg Per Tube BID  . Chlorhexidine Gluconate Cloth  6 each Topical Q0600  . diclofenac Sodium  2 g Topical BID  . diltiazem  120 mg Oral Daily  . famotidine  10 mg Oral Daily  . feeding supplement (PRO-STAT SUGAR FREE 64)  30 mL Per Tube Daily  . ferric citrate  420 mg Oral TID WC  . fluconazole  100 mg Oral Daily  . gabapentin  100 mg Per Tube Q12H  . Gerhardt's butt cream   Topical TID  . heparin  5,000 Units Subcutaneous Q8H  . hydrocortisone  25 mg Rectal BID  . loratadine  10 mg Oral Daily  . mouth rinse  15 mL Mouth Rinse BID  . multivitamin  1 tablet Oral QHS  . neomycin-bacitracin-polymyxin   Topical BID  . nystatin  5 mL Mouth/Throat QID  . pantoprazole sodium  40 mg Per Tube BID  . [START ON 09/09/2019] predniSONE  40 mg Oral Q breakfast   Followed by  . [START ON 09/10/2019] predniSONE  30 mg Oral Q breakfast  . sertraline  25 mg Oral BID  . thiamine  100 mg Oral Daily  . zinc sulfate  220 mg Oral Daily   Elmarie Shiley, MD 08/28/2019, 12:09 PM

## 2019-08-28 NOTE — Progress Notes (Signed)
Lab called with critical lact acid of 2.1 at 1342. Provider and the hospitalist provider were both notified.

## 2019-08-28 NOTE — Consult Note (Signed)
Neuropsychological Consultation   Patient:   Joanna Ali   DOB:   05/19/1951  MR Number:  [id number]  Location:  [facility] [address] [facility] [address] [place] [id number] [place] [address] Dept: [place]: [phone]           Date of Service:   08/28/2019  Start Time:   8 AM End Time:   9 AM  Provider/Observer:  Ilean Skill, Psy.D.       Clinical Neuropsychologist       Billing Code/Service: [phone]  Chief Complaint:    Joanna Ali is a 69 year old female who has a complicated medical history that can be reviewed and is full in the current HPI.  The patient has had an extended time going through diagnostic considerations as to etiological factors.  Rheumatology has now settled on an autoimmune process and consideration of scleroderma with CREST syndrome.  The patient has been on high-dose steroids for some time now.  She was positive for anti-RNA polymerase III antibody.  The patient has been extremely fatigued, slowed, and significant motor weakness and deficits.  Reason for Service:  The patient was referred for neuropsychological consultation due to coping and adjustment issues and extreme issues associated with scleroderma/small vessel disease.  Below is the HPI for the current admission.  HPI: Joanna Ali is a 69 year old female with history of hepatic steatosis, HTN, peripheral neuropathy, macular degeneration, progressive DOE with fluid overload since 02/2019 and history of pericardial/pleural effusion who was admitted on 07/03/2019 with AKI, anasarca and fatigue.  History taken from chart review and husband.  She has had negative work up by rheumatology and at [facility].  She was started on IV diuresis and cardiac echo showed moderately large pericardial effusion. Cardiac cath and cardiac MRI without evidence of pericardial constriction- negative for amyloidosis.  Dr Tera Mater consulted for assistance and she underwent  R-VATS with pericardial window on 07/12/2019. She continued to require diuresis and had worsening of renal function with intermittent hyperkalemia and uremia with Joanna Ali requiring initiation of HD 07/20/2019. She has had poor po intake, difficulty swallowing, persistent tail bone pain with FTT. She had PEG placed by Dr. Anselm Pancoast on 07/28/2019.  Palliative care consulted for Joanna Ali and questioned a trial of steroids empirically due to concerns of carcinoid or multisystem inflammatory syndrome. She has had bouts of agitation and nephrology expressed concerns of steroids making anxiety worse. Question of transfer to [facility] (no beds). Rheumatology/ Dr. Kathlene November consulted for input on autoimmune process and she was started on high dose steroids due to consideration of scleroderma with CREST syndrome.     Anti-RNA polymerase III antibody positive. Repeat SPEP negative for monoclonal protein. Dr. Jana Hakim consulted for input to rule out occult malignancy as she developed leucopenia. He  questioned possibility of malignancy with scleroderma as secondary.  CT abdomen/pelvis/chest negative and pleural fluid negative for malignancy--he felt that further screening for malignancy can be done on outpatient basis.  She underwent repeat thoracocentesis on 08/01/2019 positive for mesothelial cells--evaluate on outpatient basis once stronger.   PAF with RVR on 08/13/2019 treated with metoprolol for rate control and Dr. Margaretann Loveless  --not a candidate for coumadin or amiodarone per cardiology. Dr. Scot Dock consulted due to ischemic fingers --patient with positive pulses with negative BLE dopplers and changes felt to be due to scleroderma/small vessel disease. Metoprolol changed to Cardizem due to worsening of Raynaud's phenomenon. She was started on slow steroid taper on 1/26--to decrease by  10 mg per week to 30 mg daily with follow up with Rheum. Patient with AKI with anuria requiring HD TTS.  Please see preadmission assessment from earlier  today.  Current Status:  The patient was extremely constricted and balled up today with very limited verbal output and limited answers to questions.  The patient was extremely weak and her voice was weak and at times barely audible.  She did appear to have some awareness as to what was going on but was unclear as to all of the medical issues that she was coping with.  It was extremely difficult to get a lot out of her due to her physical status.  Behavioral Observation: Joanna Ali  presents as a 69 y.o.-year-old Right Caucasian Female who appeared her stated age. her dress was Appropriate and she was Well Groomed and her manners were Appropriate to the situation.  her participation was indicative of Appropriate and Inattentive behaviors.  There were physical disabilities noted.  she displayed an appropriate level of cooperation and motivation.     Interactions:    Minimal Appropriate and Inattentive  Attention:   abnormal and attention span appeared shorter than expected for age  Memory:   abnormal; remote memory intact, recent memory impaired  Visuo-spatial:  not examined  Speech (Volume):  low  Speech:   Extremely slow verbal response time and limited verbal output and verbal fluency.  Thought Process:  Intact  Though Content:  WNL; not suicidal and not homicidal  Orientation:   person and place  Judgment:   Poor  Planning:   Poor  Affect:    Blunted, Depressed and Flat  Mood:    Dysphoric  Insight:   Shallow  Intelligence:   normal  Medical History:   Past Medical History:  Diagnosis Date  . Hypertension   . Leg swelling 03/2019  . Short of breath on exertion 03/2019   Psychiatric History:  No prior psychiatric history noted  Family Med/Psych History:  Family History  Problem Relation Age of Onset  . Hypertension Mother     Impression/DX:  Joanna Ali is a 69 year old female who has a complicated medical history that can be reviewed and is full in the current  HPI.  The patient has had an extended time going through diagnostic considerations as to etiological factors.  Rheumatology has now settled on an autoimmune process and consideration of scleroderma with CREST syndrome.  The patient has been on high-dose steroids for some time now.  She was positive for anti-RNA polymerase III antibody.  The patient has been extremely fatigued, slowed, and significant motor weakness and deficits.  The patient was extremely constricted and balled up today with very limited verbal output and limited answers to questions.  The patient was extremely weak and her voice was weak and at times barely audible.  She did appear to have some awareness as to what was going on but was unclear as to all of the medical issues that she was coping with.  It was extremely difficult to get a lot out of her due to her physical status.   Diagnosis:    Joanna Ali, progressive scleroderma and end-stage renal disease         Electronically Signed   _______________________ Ilean Skill, Psy.D.

## 2019-08-28 NOTE — Consult Note (Signed)
Medical Consultation   Joanna Ali  ZOX:[id number]  DOB: [birth year]  DOA: 08/23/2019  PCP: Renaldo Reel, DO    Requesting physician: Ranell Patrick - PM&R  Reason for consultation: Appears to be exhausted, therapy may be hurting rather than helping.  Has lab abnormalities - ?stable.  May not be benefitting from rehab, ?causing more harm than good.  Her husband is involved, at bedside, wants her to be able to to return to traveling.  May need palliative care approach, may not be independent.     History of Present Illness: Joanna Ali is an 69 y.o. female hypertension, chronic pericardial effusion, moderate aortic valve regurgitation, former smoker, alcohol abuse initially admitted on 07/06/2019 for progressive shortness of breath with anasarca and fatigue.  The symptoms started last September. She hadbeen being followed by cardiology in the outpatient setting. Previously, worked up by [facility] rheumatology with negative work-up including ESR,lupus anticoagulant, rheumatoid factor, TSH,ANCA, ANA, and light free chain studies. She was referred to [facility] hepatology in October and had liver biopsy which noted hepatic steatosis with suspectalcohol induced fatty liver with patient'sprevious history of alcohol use. Since that time patient had stopped drinking alcohol as advised. She had been on diuretics of spironolactone and torosemide without any improvement in symptoms.Her kidney function appeared to start declining in December. CT angiogram of the chest on 12/15 noted moderate pericardial effusion and airspace disease. Labs also revealed elevated BNP was elevated. Underwent thoracentesis by PCCM which removed 700 cc of fluid. Patient had undergone right heart cath 12/17, and underwent pericardial window on 12/23. Attempts were made with IV diuretics, but patient developed worsening kidney function with creatinine up to 5. Nephrology was formally consulted and started  dialysis due to worsening kidney function. She has a PEG tube for persistent throat pain and inability to meet her nutritional needs but she is also on a regular diet.  She is on HD for AKI with rapid progression to ESRD on HD.   She had a prolonged hospitalization (d/c to CIR on 2/3) and was ultimately diagnosed with scleroderma with Raynaud's and CREST syndrome as the cause of her symptoms.  The rheumatologist, Dr. Kathlene November, recommended Solumedrol to prednisone taper from 60 mg daily, with decreasing dosage by 10 mg each week until down to 30 mg daily.  She was transferred to rehab on Friday and was doing well.  Saturday, she had HD and "it's been a mess since then."  She has bene excessively sleeping, complaining of abdominal pain and nausea.  Her husband thinks she may have mild delirium - possibly visual hallucinations, paranoia intermittently.  She was getting progressively better downstairs but HD "always set her back"..  She just hasn't really bounced back since HD.  He feels like the steroids have made a significant improvement and when she has decreased doses she has gotten worse.   She has been complaining of abdominal pain but appears to have difficulty localizing.    Review of Systems:  ROS Unable to perform   Past Medical History: Past Medical History:  Diagnosis Date  . Atrial fibrillation with RVR ([place])   . Diffuse scleroderma ([place])   . ESRD on hemodialysis ([place])   . Hypertension   . Pericardial effusion 06/2019    Past Surgical History: Past Surgical History:  Procedure Laterality Date  . APPENDECTOMY    . IR FLUORO GUIDE CV LINE LEFT  07/18/2019  . IR GASTROSTOMY  TUBE MOD SED  07/28/2019  . IR US GUIDE VASC ACCESS LEFT  07/18/2019  . NECK SURGERY    . RIGHT AND LEFT HEART CATH N/A 07/06/2019   Procedure: RIGHT AND LEFT HEART CATH;  Surgeon: Jolaine Artist, MD;  Location: [place] [facility];  Service: Cardiovascular;  Laterality: N/A;  . TEE WITHOUT CARDIOVERSION N/A  03/28/2019   Procedure: TRANSESOPHAGEAL ECHOCARDIOGRAM (TEE);  Surgeon: Geralynn Rile, MD;  Location: [place];  Service: Cardiology;  Laterality: N/A;  . VIDEO ASSISTED THORACOSCOPY Right 07/12/2019   Procedure: VIDEO ASSISTED THORACOSCOPY Pericardial window ;  Surgeon: Lajuana Matte, MD;  Location: [place];  Service: Thoracic;  Laterality: Right;     Allergies:  No Known Allergies   Social History:  reports that she quit smoking about 40 years ago. Her smoking use included cigarettes. [name]. She has never used smokeless tobacco. [name].   Family History: Family History  Problem Relation Age of Onset  . Hypertension Mother       Physical Exam: Vitals:   08/28/19 0355 08/28/19 0358 08/28/19 0956 08/28/19 1142  BP:   138/75 (!) 153/87  Pulse:    84  Resp:    15  Temp: (!) 97.5 F (36.4 C) 98 F (36.7 C)  (!) 96.8 F (36 C)  TempSrc: Oral Axillary    SpO2:      Weight: 42 kg       Constitutional: minimally interactive, answers questions in a soft voice Eyes:  EOMI, irises appear normal, anicteric sclera ENMT: external ears and nose appear normal, excoriation with scant bleeding along the philtrum Neck: neck appears normal, no masses, normal ROM, no thyromegaly, no JVD  CVS: S1-S2 clear, no murmur rubs or gallops, no LE edema, normal pedal pulses  Respiratory:  RLL rhonchi. Respiratory effort normal. No accessory muscle use.  Abdomen: soft nontender, nondistended, PEG tube Musculoskeletal: : marked Raynaud's cyanosis of primarily the right distal fingertips; B heels wrapped to prevent pressure ulcers Psych: very flat mood and affect, minimally interactive but able to respond to limited questions   Data reviewed:  I have personally reviewed the recent labs and imaging studies  Pertinent Labs:   Na++ 125; 130 on 2/4 CO2 20; 26 on 2/4 BUN 81/Creatinine 2.31/GFR  21 Albumin 2.8 Unremarkable CBC; Hgb improved from 2/4 Heme positive Normal TSH   Inpatient Medications:   Scheduled Meds: . acetaminophen  500 mg Oral BID  . ascorbic acid  500 mg Per Tube BID  . Chlorhexidine Gluconate Cloth  6 each Topical Q0600  . diclofenac Sodium  2 g Topical BID  . diltiazem  120 mg Oral Daily  . famotidine  10 mg Oral Daily  . feeding supplement (PRO-STAT SUGAR FREE 64)  30 mL Per Tube Daily  . ferric citrate  420 mg Oral TID WC  . fluconazole  100 mg Oral Daily  . gabapentin  100 mg Per Tube Q12H  . Gerhardt's butt cream   Topical TID  . heparin  5,000 Units Subcutaneous Q8H  . hydrocortisone  25 mg Rectal BID  . loratadine  10 mg Oral Daily  . mouth rinse  15 mL Mouth Rinse BID  . methylPREDNISolone (SOLU-MEDROL) injection  80 mg Intravenous Q12H  . multivitamin  1 tablet Oral QHS  . neomycin-bacitracin-polymyxin   Topical BID  . nystatin  5 mL Mouth/Throat QID  . pantoprazole  sodium  40 mg Per Tube BID  . sertraline  25 mg Oral BID  . thiamine  100 mg Oral Daily  . zinc sulfate  220 mg Oral Daily   Continuous Infusions: . sodium chloride    . sodium chloride    . sodium chloride    . feeding supplement (NEPRO CARB STEADY) 50 mL/hr at 08/28/19 0400     Radiological Exams on Admission: No results found.  Impression/Recommendations Principal Problem:   Debility Active Problems:   Pericardial effusion   Hypertension   Protein-calorie malnutrition, severe   ESRD on hemodialysis (HCC)   Atrial fibrillation with RVR (HCC)   Scleroderma progressive (HCC)   Stage III pressure ulcer of sacral region (HCC)   Heme positive stool  Debility -Patient with very prolonged hospitalization, discharged to CIR on 2/3 -Her husband reports that she was doing great on 2/3 but she went to HD on Saturday (2/4) and has been minimally interactive since  -There was discussion about transferring the patient back to the acute side -For now, will continue to  monitor and assess since obviously resumption of therapy is preferred if possible (see below for current interventions)  AMS -Following HD on Saturday, she has been minimally interactive and unable to participate in therapy -This may be associated with progressive disease (see below); infection; or other -She does not have apparent sepsis physiology, although her lactate is mildly elevated - will trend -NH4 is normal -Procalcitonin is pending -CXR ordered and does not appear to be significantly changed from 1/17 image by my read; will await radiologist report -Abd [place] is pending -Head CT is pending -Dialysis dysequilibrium is also a consideration; nephrology is following  Scleroderma  -The patient was diagnosed with this issue during her prior hospitalization -She was started on solumedrol and transitioned from 60 mg/day prednisone for a week to 50 and now to 40 -It is possible that her AMS is related to her steroid taper -For now, will increase steroids back to Solumedrol 80 mg IV BID and continue to monitor for an additional day -If she remains unable to participate in therapy, she will unfortunately need transition back to the acute side -Dr. Hilma Favors had been working with her while she was in the acute care hospital; ongoing palliative care discussions may be needed -If she does not respond to the increased dose of steroids, her prognosis appears to be quite poor, unforunately  ESRD on HD -Patient on chronic TTS HD -Nephrology is consulting -Her electrolyte issues are likely related to HD -She also became lethargic after HD on 1/13  Heme positive stool -Appears to be hemodynamically stable -Hgb is actually somewhat better than prior  -Abdominal US is pending -Would not currently pursue GI consultation unless this issue progresses given her multitude of other issues  Afib -Rate controlled with Cardizem -She is a poor candidate for [facility] -This does increase her risk of CVA although  she appears to have a non-focal neurologic exam at this time    Thank you for this consultation.  Our [facility] hospitalist team will follow the patient with you.   Time Spent: 60 minutes  Karmen Bongo M.D. [facility] 08/28/2019, 1:38 PM

## 2019-08-28 NOTE — Progress Notes (Addendum)
[facility] PHYSICAL MEDICINE & REHABILITATION PROGRESS NOTE   Subjective/Complaints: Patient appears very tired this morning. Has no complaints.  + abdominal pain and nausea, uncontrolled with zofran Says she is sleeping well at night.    ROS: has loose stools due to TFs; denies vision changes, has sore throat- slightly better Objective:   No results found. Recent Labs    08/28/19 0037 08/28/19 0534  WBC 4.8 6.3  HGB 11.3* 12.0  HCT 37.5 41.1  PLT 146* 148*   Recent Labs    08/28/19 0542  NA 125*  K 5.0  CL 88*  CO2 20*  GLUCOSE 79  BUN 81*  CREATININE 2.31*  CALCIUM 9.2    Intake/Output Summary (Last 24 hours) at 08/28/2019 1002 Last data filed at 08/28/2019 0939 Gross per 24 hour  Intake 1250 ml  Output --  Net 1250 ml     Physical Exam: Vital Signs Blood pressure 127/76, pulse 77, temperature 98 F (36.7 C), temperature source Axillary, resp. rate 18, weight 42 kg, SpO2 96 %.   Physical Exam  Vitals reviewed and labs reviewed. Constitutional: Emaciated. Cachetic; shiny skin due to scleroderma all over- like skin pulled tight; supine; getting TFs/meds via nursing; c/o temporary abd pain, not nausea with med admin via PEG;  NAD. Appears very weak and tired this morning, but responsive to questions.  HENT:  Head: Normocephalic and atraumatic.  Eyes: conjugate gaze when opens eyes  Neck: No tracheal deviation present.   CV: RRR Respiratory:CTA B/L- not coarse or have wheezes, but decreased breath sounds- shallow breathing of note GI: soft, NT, ND, (+)BS hyperactive as getting meds; getting TF via PEG Musculoskeletal:     Comments: No edema or tenderness in extremities  Neurological: She is alert.  Motor: Bilateral upper extremities: 3+-4-/5 proximal distal Bilateral lower extremities: Hip flexion 3+-4-/5, knee extension 3 -/5 (limited due to contractures), ankle dorsiflexion 3-3+/5 Sensation intact light touch  Skin:  Scattered abrasions Peripheral  cyanosis - very cold hands and feet- appears blue tinged Psychiatric: Her affect is blunt. Her speech is delayed. She is slowed.   Assessment/Plan: 1. Functional deficits secondary to debility due to pericardia windown and pericardial effusion/ Scleroderma/Raynaud's/new ESRD on HD which require 3+ hours per day of interdisciplinary therapy in a comprehensive inpatient rehab setting.  Physiatrist is providing close team supervision and 24 hour management of active medical problems listed below.  Physiatrist and rehab team continue to assess barriers to discharge/monitor patient progress toward functional and medical goals  Care Tool:  Bathing    Body parts bathed by patient: Chest, Right upper leg, Left upper leg, Face, Left arm, Right arm   Body parts bathed by helper: Right arm, Left arm, Chest, Abdomen, Front perineal area, Buttocks, Right upper leg, Left upper leg, Right lower leg, Left lower leg, Face     Bathing assist Assist Level: Total Assistance - Patient < 25%     Upper Body Dressing/Undressing Upper body dressing Upper body dressing/undressing activity did not occur (including orthotics): Safety/medical concerns What is the patient wearing?: Pull over shirt    Upper body assist Assist Level: Total Assistance - Patient < 25%    Lower Body Dressing/Undressing Lower body dressing      What is the patient wearing?: Pants, Incontinence brief     Lower body assist Assist for lower body dressing: Total Assistance - Patient < 25%     Toileting Toileting    Toileting assist Assist for toileting: Dependent - Patient 0%  Transfers Chair/bed transfer  Transfers assist     Chair/bed transfer assist level: Maximal Assistance - Patient 25 - 49%     Locomotion Ambulation   Ambulation assist   Ambulation activity did not occur: Safety/medical concerns(Limited due to lethargy and elevated BP)          Walk 10 feet activity   Assist  Walk 10 feet  activity did not occur: Safety/medical concerns(Limited due to lethargy and elevated BP)        Walk 50 feet activity   Assist Walk 50 feet with 2 turns activity did not occur: Safety/medical concerns(Limited due to lethargy and elevated BP)         Walk 150 feet activity   Assist Walk 150 feet activity did not occur: Safety/medical concerns(Limited due to lethargy and elevated BP)         Walk 10 feet on uneven surface  activity   Assist Walk 10 feet on uneven surfaces activity did not occur: Safety/medical concerns(Limited due to lethargy and elevated BP)         Wheelchair     Assist     Wheelchair activity did not occur: Safety/medical concerns(Limited due to lethargy and elevated BP)         Wheelchair 50 feet with 2 turns activity    Assist    Wheelchair 50 feet with 2 turns activity did not occur: Safety/medical concerns(Limited due to lethargy and elevated BP)       Wheelchair 150 feet activity     Assist  Wheelchair 150 feet activity did not occur: Safety/medical concerns(Limited due to lethargy and elevated BP)       Blood pressure 127/76, pulse 77, temperature 98 F (36.7 C), temperature source Axillary, resp. rate 18, weight 42 kg, SpO2 96 %.  Medical Problem List and Plan: 1.  Deficits with mobility, transfers, endurance, self-care secondary to debility.              -patient may may shower             -ELOS/Goals: 13-17 days/Supervision             -Patient is exhausted and does not appear to be benefiting from CIR at this time. Spoke with therapists, RN, PA, IM hospitalist, and husband at bedside. Recommend that she be transferred to acute care as team feels therapy may be hurting more than helping her at this time.  2.  Antithrombotics: -DVT/anticoagulation:  Pharmaceutical: Heparin             -antiplatelet therapy: N/A 3. Peripheral neuropathy/Pain Management:  Tylenol bid with oxycodone prn.   4. Mood: Team to provide  ego support. LCSW to follow for evaluation and support.              -antipsychotic agents: N/A 5. Neuropsych: This patient is not fully capable of making decisions on her own behalf. 6. 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 Add Vitamin C/Zinc for healing. ON tube feeds for nutritional support due to FTT. Air mattress overlay.  7. Fluids/Electrolytes/Nutrition: Strict I/O with daily weights. Regal diet with 1500 cc FR.  8. 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 Cont Cardizem. 9. A fib with RVR: [facility]  No BB due to Raynaurds. Monitor HR tid --rate controlled on Cardizem.             Monitor with increased mobility.  10. Leucopenia: Stable--follow up with Heme/onc for work up after discharge.                    CBC ordered for tomorrow.  2/4- WBC is 3.3- will monitor  2/8 WBC is 6.3; stable.  11. Metabolic encephalopathy: Treated with IV thiamine and has improved with decrease in narcotics/sedatives as well as initiation of HD  2/7- will add TSH to AM labs to see if could be why so sedated/lethargic  2/8: TSH is 2.259; within normal limits but much less than 1/13 elevated value. Will obtain IM consult today regarding lab abnormalities/weakness.  12. New ESRD: HD dependent due to progression of AKI. Has been accepted by [facility].   2/6- Renal hasn't pursued perm access due to Raynaud's comorbidities.  13. Pericardial effusion/Pleural effusion:  S/p pericardial window 12/2. S/p thoracocentesis   2/5- sounds tight- ordered Duonebs q4 hours prn  2/6- sats 100% 14. Hepatic Steatosis: Abdominal pain with pruritis. Follow[facility] at [facility].               CMP ordered for tomorrow AM  Ordered renal function panel  since on HD- will add LFTs to this for Monday 15. Severe protein-calorie Malnutrition: Encourage po intake as able. Continue tube feeds.  16. Anemia of chronic disease:             CBC ordered for tomorrow AM  2/4- Hb up to 12.7k-   2/8: Hgb 12.  17. Anxiety disorder with panic attacks: On Zoloft with alprazolam tid prn.  18. Thrombocytopenia: Stable without signs of bleeding.              CBC ordered for tomorrow AM  2/4- 128k-will con't regimen and monitor  2/8: improved to 148 19. Hyponatremia  2/4- up to 133 from 124- will monitor  2/8: Decreased to 125. Likely etiology of her weakness. Will obtain IM consult.  20- Lethargy/poor energy- can be worse with Xanax- will monitor  2/7- will also check TSH as well.  21. Hemorrhoids/Fecal Hemeoccult (+)  2/6- ordered Anusol suppositories for hemorrhoids.   2/7- hemeoccult (+)- could be due to hemorrhoids, however will recheck Hb in AM- if dropping, need to consult GI.   2/8: Hemeoccult positive. Hgb improved to 12.0.  22. Abdominal Pain: 2/8 will obtain portable abdominal US. Patient unable to show where in her abdomen pain is worst due to her fatigue  23. Nausea: 2/8 No longer receiving benefit from Zofran. Will add Companzine po to alternate with Zofran. If this doesn't provide relief, consider IV Zofran/GI consult.     >35 minutes spent in care and coordination of care of patient.   LOS: 5 days A FACE TO FACE EVALUATION WAS PERFORMED  Heberto Sturdevant P Lindy Garczynski 08/28/2019, 10:02 AM

## 2019-08-28 NOTE — Progress Notes (Signed)
Physical Therapy Session Note  Patient Details  Name: Joanna Ali MRN: [id number] Date of Birth: [birth year]  Today's Date: 08/28/2019 PT Individual Time: 0930-1015 PT Individual Time Calculation (min): 45 min   Short Term Goals: Week 1:  PT Short Term Goal 1 (Week 1): Patient will peroform bed mobility with CGA consistently. PT Short Term Goal 2 (Week 1): Patient will perfomr transfers with mod A using LRAD. PT Short Term Goal 3 (Week 1): Patient with initiate gait training.  Skilled Therapeutic Interventions/Progress Updates:     Patient in bed with RN providing morning medications upon PT arrival. Patient lethargic and reporting nausea this morning, however, she was agreeable to PT session. Patient reported a sore throat and pain in her sacrum, she was unable to rate pain during session, RN made aware. PT provided repositioning, rest breaks, and distraction as pain interventions throughout session. Patient's husband arrived 5 min into session and participated in family education throughout session. He expressed concerns about worsening nausea and lethargy, reported that it has been constant for several days, and has questions about the medications that she is taking. Differed questions to the medical team after session.   Therapeutic Activity: Bed Mobility: Patient performed supine to/from x2 sit with max A for trunk and LE management, patient would initiation movement then would be unable to continue due to fatigue. Provided verbal cues for bringing LEs off the bed and pushing to sit up. Demonstrated safe hand placement during transfers to patient's husband to limit pulling on patient's extremities to maintain joint integrity. Patient husband performed second trial with min cues for hand placement. Patient sat EOB x2 min between trials, however, nausea worsened and she requested to lie down.  Transfers: Patient performed stand pivot bed<>w/c with total-max A without AD. Provided verbal cues for  sequencing and pushing through LEs. Demonstrated safe technique and hand placement on first trial and patient's husband performed transfer on second trial with min cues for hand placement. She sat in the w/c on a Roho cushion x10 min with improved nausea initially. She requested ice chips and ate several sitting in the chair. In sitting she would close her eyes and require tactile stimulation to maintain arousal. She reported that he feet and legs were hot, unable to obtain a oral or axillary temperature, RN made aware. Provided cues for erect posture in sitting and asked questions about activities she enjoys with limited responses due to fatigue and nausea. Patient's vocalization remains hypophonic and patient only able mouth some responses during session. At 10 mins in sitting she requested to lay back down due to fatigue. Patient was returned to the bed, as described above.  Vitals:  Supine: BP 108/75 HR 83 Sitting: BP 138/75 HR 79  Discussed patient's limited progress over the weekend and current limitations for intensive therapy with her husband. He stated understanding and informed him that the rehab team would be informed of her current status and someone from the medical team would be informed and come by today. He was appreciative and receptive to these discussions. RN, MD, PA, CSW, and OT notified of patient's status following session.   Patient in bed with her husband at bedside at end of session with breaks locked, bed alarm set, and all needs within reach.    Therapy Documentation Precautions:  Precautions Precautions: Fall Precaution Comments: PEG tube, sacral wound, watch BP with mobility, maintain systolic <371 mmHg Restrictions Weight Bearing Restrictions: No General: PT Amount of Missed Time (min): 15 Minutes PT Missed  Treatment Reason: Patient fatigue;Patient ill (Comment)(nausea/lethargy)    Therapy/Group: Individual Therapy  Tylek Boney L Jamari Moten PT, DPT  08/28/2019,  11:46 AM

## 2019-08-28 NOTE — Progress Notes (Signed)
Pt checked qh for safety. Bed low with fall alarm on and call bell within reach.

## 2019-08-28 NOTE — Progress Notes (Signed)
Occupational Therapy Session Note  Patient Details  Name: Joanna Ali MRN: [id number] Date of Birth: [birth year]  Today's Date: 08/28/2019 OT Individual Time:  -       Short Term Goals: Week 1:  OT Short Term Goal 1 (Week 1): Patient will complete bed mobility with CS OT Short Term Goal 2 (Week 1): patient will complete sit to stand and SPT with mod A OT Short Term Goal 3 (Week 1): patient will complete UB bathing/dressing with mod A OT Short Term Goal 4 (Week 1): Patient will tolerate sitting upright for 1 hour and perform weight shifts with min a  Skilled Therapeutic Interventions/Progress Updates:    Patient asleep in bed, husband present, she does not arouse to conversation or stimulation - husband states that she has declined since Saturday - MD aware.  Missing afternoon session today due to medical instability and fatigue.  Therapy Documentation Precautions:  Precautions Precautions: Fall Precaution Comments: PEG tube, sacral wound, watch BP with mobility, maintain systolic <888 mmHg Restrictions Weight Bearing Restrictions: No General: General OT Amount of Missed Time: 75 Minutes PT Missed Treatment Reason: Patient fatigue;Patient ill (Comment)(nausea/lethargy) Vital Signs: Therapy Vitals Temp: (!) 96.8 F (36 C) Pulse Rate: 84 Resp: 15 BP: (!) 153/87 Patient Position (if appropriate): Sitting Oxygen Therapy O2 Device: Room Air  Therapy/Group: Individual Therapy  Carlos Levering 08/28/2019, 1:11 PM

## 2019-08-28 NOTE — Progress Notes (Signed)
Rehab PA and hospitalist were informed about lactic acid  results

## 2019-08-29 ENCOUNTER — Inpatient Hospital Stay (HOSPITAL_COMMUNITY): Payer: Medicare Other | Admitting: Occupational Therapy

## 2019-08-29 ENCOUNTER — Inpatient Hospital Stay (HOSPITAL_COMMUNITY): Payer: Medicare Other

## 2019-08-29 ENCOUNTER — Inpatient Hospital Stay (HOSPITAL_COMMUNITY)
Admission: AD | Admit: 2019-08-29 | Discharge: 2019-09-18 | DRG: 291 | Disposition: E | Payer: Medicare Other | Source: Intra-hospital | Attending: Internal Medicine | Admitting: Internal Medicine

## 2019-08-29 DIAGNOSIS — R627 Adult failure to thrive: Secondary | ICD-10-CM

## 2019-08-29 DIAGNOSIS — Z9049 Acquired absence of other specified parts of digestive tract: Secondary | ICD-10-CM

## 2019-08-29 DIAGNOSIS — M341 CR(E)ST syndrome: Secondary | ICD-10-CM | POA: Diagnosis present

## 2019-08-29 DIAGNOSIS — L89153 Pressure ulcer of sacral region, stage 3: Secondary | ICD-10-CM | POA: Diagnosis present

## 2019-08-29 DIAGNOSIS — Z87891 Personal history of nicotine dependence: Secondary | ICD-10-CM | POA: Diagnosis not present

## 2019-08-29 DIAGNOSIS — Z66 Do not resuscitate: Secondary | ICD-10-CM | POA: Diagnosis not present

## 2019-08-29 DIAGNOSIS — K559 Vascular disorder of intestine, unspecified: Secondary | ICD-10-CM | POA: Diagnosis present

## 2019-08-29 DIAGNOSIS — N186 End stage renal disease: Secondary | ICD-10-CM | POA: Diagnosis present

## 2019-08-29 DIAGNOSIS — N179 Acute kidney failure, unspecified: Secondary | ICD-10-CM | POA: Diagnosis present

## 2019-08-29 DIAGNOSIS — I313 Pericardial effusion (noninflammatory): Secondary | ICD-10-CM | POA: Diagnosis present

## 2019-08-29 DIAGNOSIS — D638 Anemia in other chronic diseases classified elsewhere: Secondary | ICD-10-CM | POA: Diagnosis present

## 2019-08-29 DIAGNOSIS — Z931 Gastrostomy status: Secondary | ICD-10-CM

## 2019-08-29 DIAGNOSIS — E869 Volume depletion, unspecified: Secondary | ICD-10-CM | POA: Diagnosis present

## 2019-08-29 DIAGNOSIS — R1310 Dysphagia, unspecified: Secondary | ICD-10-CM | POA: Diagnosis present

## 2019-08-29 DIAGNOSIS — L89154 Pressure ulcer of sacral region, stage 4: Secondary | ICD-10-CM | POA: Diagnosis present

## 2019-08-29 DIAGNOSIS — Z7952 Long term (current) use of systemic steroids: Secondary | ICD-10-CM

## 2019-08-29 DIAGNOSIS — R34 Anuria and oliguria: Secondary | ICD-10-CM | POA: Diagnosis present

## 2019-08-29 DIAGNOSIS — Z79899 Other long term (current) drug therapy: Secondary | ICD-10-CM

## 2019-08-29 DIAGNOSIS — R0602 Shortness of breath: Secondary | ICD-10-CM

## 2019-08-29 DIAGNOSIS — K5150 Left sided colitis without complications: Secondary | ICD-10-CM | POA: Diagnosis present

## 2019-08-29 DIAGNOSIS — R4182 Altered mental status, unspecified: Secondary | ICD-10-CM | POA: Diagnosis present

## 2019-08-29 DIAGNOSIS — G9340 Encephalopathy, unspecified: Secondary | ICD-10-CM | POA: Diagnosis not present

## 2019-08-29 DIAGNOSIS — R0902 Hypoxemia: Secondary | ICD-10-CM | POA: Diagnosis not present

## 2019-08-29 DIAGNOSIS — Z8249 Family history of ischemic heart disease and other diseases of the circulatory system: Secondary | ICD-10-CM

## 2019-08-29 DIAGNOSIS — I96 Gangrene, not elsewhere classified: Secondary | ICD-10-CM | POA: Diagnosis not present

## 2019-08-29 DIAGNOSIS — G909 Disorder of the autonomic nervous system, unspecified: Secondary | ICD-10-CM | POA: Diagnosis present

## 2019-08-29 DIAGNOSIS — L8994 Pressure ulcer of unspecified site, stage 4: Secondary | ICD-10-CM | POA: Diagnosis not present

## 2019-08-29 DIAGNOSIS — D631 Anemia in chronic kidney disease: Secondary | ICD-10-CM | POA: Diagnosis present

## 2019-08-29 DIAGNOSIS — I351 Nonrheumatic aortic (valve) insufficiency: Secondary | ICD-10-CM | POA: Diagnosis present

## 2019-08-29 DIAGNOSIS — E43 Unspecified severe protein-calorie malnutrition: Secondary | ICD-10-CM | POA: Diagnosis present

## 2019-08-29 DIAGNOSIS — I480 Paroxysmal atrial fibrillation: Secondary | ICD-10-CM | POA: Diagnosis present

## 2019-08-29 DIAGNOSIS — R5381 Other malaise: Secondary | ICD-10-CM | POA: Diagnosis not present

## 2019-08-29 DIAGNOSIS — I132 Hypertensive heart and chronic kidney disease with heart failure and with stage 5 chronic kidney disease, or end stage renal disease: Principal | ICD-10-CM | POA: Diagnosis present

## 2019-08-29 DIAGNOSIS — M340 Progressive systemic sclerosis: Secondary | ICD-10-CM | POA: Diagnosis present

## 2019-08-29 DIAGNOSIS — K529 Noninfective gastroenteritis and colitis, unspecified: Secondary | ICD-10-CM | POA: Diagnosis not present

## 2019-08-29 DIAGNOSIS — Z992 Dependence on renal dialysis: Secondary | ICD-10-CM | POA: Diagnosis not present

## 2019-08-29 DIAGNOSIS — G9341 Metabolic encephalopathy: Secondary | ICD-10-CM | POA: Diagnosis present

## 2019-08-29 DIAGNOSIS — K921 Melena: Secondary | ICD-10-CM | POA: Diagnosis not present

## 2019-08-29 DIAGNOSIS — Z681 Body mass index (BMI) 19 or less, adult: Secondary | ICD-10-CM

## 2019-08-29 DIAGNOSIS — Z515 Encounter for palliative care: Secondary | ICD-10-CM | POA: Diagnosis present

## 2019-08-29 DIAGNOSIS — E871 Hypo-osmolality and hyponatremia: Secondary | ICD-10-CM | POA: Diagnosis present

## 2019-08-29 DIAGNOSIS — E162 Hypoglycemia, unspecified: Secondary | ICD-10-CM | POA: Diagnosis present

## 2019-08-29 DIAGNOSIS — Z8673 Personal history of transient ischemic attack (TIA), and cerebral infarction without residual deficits: Secondary | ICD-10-CM

## 2019-08-29 DIAGNOSIS — R400 Somnolence: Secondary | ICD-10-CM | POA: Diagnosis not present

## 2019-08-29 DIAGNOSIS — L8990 Pressure ulcer of unspecified site, unspecified stage: Secondary | ICD-10-CM | POA: Diagnosis present

## 2019-08-29 DIAGNOSIS — I10 Essential (primary) hypertension: Secondary | ICD-10-CM | POA: Diagnosis present

## 2019-08-29 DIAGNOSIS — D500 Iron deficiency anemia secondary to blood loss (chronic): Secondary | ICD-10-CM | POA: Diagnosis present

## 2019-08-29 DIAGNOSIS — E875 Hyperkalemia: Secondary | ICD-10-CM | POA: Diagnosis present

## 2019-08-29 DIAGNOSIS — E872 Acidosis: Secondary | ICD-10-CM | POA: Diagnosis present

## 2019-08-29 DIAGNOSIS — I7300 Raynaud's syndrome without gangrene: Secondary | ICD-10-CM | POA: Diagnosis not present

## 2019-08-29 DIAGNOSIS — Z8679 Personal history of other diseases of the circulatory system: Secondary | ICD-10-CM | POA: Diagnosis not present

## 2019-08-29 LAB — COMPREHENSIVE METABOLIC PANEL
ALT: 24 U/L (ref 0–44)
AST: 32 U/L (ref 15–41)
Albumin: 2.7 g/dL — ABNORMAL LOW (ref 3.5–5.0)
Alkaline Phosphatase: 99 U/L (ref 38–126)
Anion gap: 18 — ABNORMAL HIGH (ref 5–15)
BUN: 140 mg/dL — ABNORMAL HIGH (ref 8–23)
CO2: 19 mmol/L — ABNORMAL LOW (ref 22–32)
Calcium: 8.9 mg/dL (ref 8.9–10.3)
Chloride: 88 mmol/L — ABNORMAL LOW (ref 98–111)
Creatinine, Ser: 3.36 mg/dL — ABNORMAL HIGH (ref 0.44–1.00)
GFR calc Af Amer: 15 mL/min — ABNORMAL LOW (ref >60)
GFR calc non Af Amer: 13 mL/min — ABNORMAL LOW (ref >60)
Glucose, Bld: 112 mg/dL — ABNORMAL HIGH (ref 70–99)
Potassium: 5.6 mmol/L — ABNORMAL HIGH (ref 3.5–5.1)
Sodium: 125 mmol/L — ABNORMAL LOW (ref 135–145)
Total Bilirubin: 0.9 mg/dL (ref 0.3–1.2)
Total Protein: 5.5 g/dL — ABNORMAL LOW (ref 6.5–8.1)

## 2019-08-29 LAB — CBC
HCT: 33.6 % — ABNORMAL LOW (ref 36.0–46.0)
Hemoglobin: 10.1 g/dL — ABNORMAL LOW (ref 12.0–15.0)
MCH: 28.1 pg (ref 26.0–34.0)
MCHC: 30.1 g/dL (ref 30.0–36.0)
MCV: 93.6 fL (ref 80.0–100.0)
Platelets: 216 10*3/uL (ref 150–400)
RBC: 3.59 MIL/uL — ABNORMAL LOW (ref 3.87–5.11)
RDW: 21.2 % — ABNORMAL HIGH (ref 11.5–15.5)
WBC: 6.2 10*3/uL (ref 4.0–10.5)
nRBC: 0.5 % — ABNORMAL HIGH (ref 0.0–0.2)

## 2019-08-29 LAB — GLUCOSE, CAPILLARY
Glucose-Capillary: 42 mg/dL — CL (ref 70–99)
Glucose-Capillary: 98 mg/dL (ref 70–99)

## 2019-08-29 LAB — LACTIC ACID, PLASMA: Lactic Acid, Venous: 1.7 mmol/L (ref 0.5–1.9)

## 2019-08-29 LAB — LIPASE, BLOOD: Lipase: 31 U/L (ref 11–51)

## 2019-08-29 MED ORDER — HEPARIN SODIUM (PORCINE) 1000 UNIT/ML DIALYSIS: 40.0000 [IU]/kg | INTRAMUSCULAR | Status: DC | PRN

## 2019-08-29 MED ORDER — METHYLPREDNISOLONE SODIUM SUCC 125 MG IJ SOLR
80.0000 mg | Freq: Two times a day (BID) | INTRAMUSCULAR | Status: DC
  Administered 2019-08-29: 80 mg via INTRAVENOUS
  Filled 2019-08-29: qty 2

## 2019-08-29 MED ORDER — ALPRAZOLAM 0.25 MG PO TABS
0.2500 mg | ORAL_TABLET | Freq: Three times a day (TID) | ORAL | Status: DC | PRN
  Administered 2019-08-29 – 2019-08-31 (×3): 0.25 mg via ORAL
  Filled 2019-08-29 (×3): qty 1

## 2019-08-29 MED ORDER — NEPRO/CARBSTEADY PO LIQD: 1000.0000 mL | ORAL | Status: DC

## 2019-08-29 MED ORDER — RENA-VITE PO TABS
1.0000 | ORAL_TABLET | Freq: Every day | ORAL | Status: DC
  Administered 2019-08-29 – 2019-08-31 (×3): 1 via ORAL
  Filled 2019-08-29 (×3): qty 1

## 2019-08-29 MED ORDER — ACETAMINOPHEN 160 MG/5ML PO SOLN
500.0000 mg | Freq: Four times a day (QID) | ORAL | Status: DC | PRN
  Administered 2019-08-29 – 2019-08-30 (×3): 500 mg via ORAL
  Filled 2019-08-29 (×3): qty 20.3

## 2019-08-29 MED ORDER — OXYCODONE HCL 5 MG PO TABS
5.0000 mg | ORAL_TABLET | Freq: Three times a day (TID) | ORAL | Status: DC | PRN
  Administered 2019-08-29: 5 mg via ORAL
  Filled 2019-08-29: qty 1

## 2019-08-29 MED ORDER — BIOTENE DRY MOUTH MT LIQD: 15.0000 mL | OROMUCOSAL | Status: DC | PRN

## 2019-08-29 MED ORDER — THIAMINE HCL 100 MG PO TABS
100.0000 mg | ORAL_TABLET | Freq: Every day | ORAL | Status: DC
  Administered 2019-08-30 – 2019-08-31 (×2): 100 mg via ORAL
  Filled 2019-08-29 (×2): qty 1

## 2019-08-29 MED ORDER — CAMPHOR-MENTHOL 0.5-0.5 % EX LOTN: TOPICAL_LOTION | CUTANEOUS | Status: DC | PRN

## 2019-08-29 MED ORDER — SERTRALINE HCL 50 MG PO TABS: 25.0000 mg | ORAL_TABLET | Freq: Two times a day (BID) | ORAL | Status: DC

## 2019-08-29 MED ORDER — DEXTROSE 50 % IV SOLN
INTRAVENOUS | Status: AC
  Filled 2019-08-29: qty 50

## 2019-08-29 MED ORDER — NEPRO/CARBSTEADY PO LIQD
1000.0000 mL | ORAL | Status: DC
  Filled 2019-08-29: qty 1500

## 2019-08-29 MED ORDER — METHYLPREDNISOLONE SODIUM SUCC 125 MG IJ SOLR
125.0000 mg | Freq: Three times a day (TID) | INTRAMUSCULAR | Status: DC
  Administered 2019-08-29 – 2019-09-01 (×8): 125 mg via INTRAVENOUS
  Filled 2019-08-29 (×8): qty 2

## 2019-08-29 MED ORDER — DEXTROSE 50 % IV SOLN
25.0000 g | INTRAVENOUS | Status: AC
  Administered 2019-08-29: 25 g via INTRAVENOUS

## 2019-08-29 MED ORDER — HEPARIN SODIUM (PORCINE) 1000 UNIT/ML IJ SOLN
INTRAMUSCULAR | Status: AC
  Administered 2019-08-29: 2200 [IU] via INTRAVENOUS_CENTRAL
  Filled 2019-08-29: qty 4

## 2019-08-29 MED ORDER — BISACODYL 10 MG RE SUPP: 10.0000 mg | Freq: Every day | RECTAL | Status: DC | PRN

## 2019-08-29 NOTE — H&P (Addendum)
History and Physical    Joanna Ali KYH:[id number] DOB: May 14, 1951 DOA: 09/12/2019  PCP: Renaldo Reel, DO  Patient coming from: CIR  I have personally extensively reviewed patient's old medical records in [facility] as she had extended hospital stay from 07/03/19-08/23/19  Chief Complaint: decline in functional and mental status with increasing drowsiness  HPI: Joanna Ali is a 69 y.o. female with medical history significant of extended hospital stay 07/03/19-08/23/19 and then discharged to CIR. She did undergo dialysis 08/26/19 and afterwards is normally tired and sleepy for 5-6 hours per husband. Her husband does help to provide all history as patient cannot as well as her rehab MD. She was sleepy most of that day and into the weekend. She never really regained her functional status. She at maximum improvement at CIR was able to sit up at the side of the bed. Since that time she has not been able to move much and is sleeping most of the day. She does have new diagnosis of progressive scleroderma/CREST from recent admission and was on steroid taper. That was decreased to 30 mg prednisone daily several days prior to decline in function. No other recent medication changes have been made. Initially she was having some tenderness in her stomach but this is now resolved. She does have indwelling tube feed and temporary dialysis catheter but no pain or discharge from those locations or redness. She has not had fevers or chills at CIR. She denies chest pains or SOB. No change in her breathing per staff and husband. No new diarrhea, nausea, vomiting. She does not make urine and does HD Tu/Th/Sat. Most recent HD 08/25/18. She does not have new swelling in feet or ankles. She does have a pressure wound on sacrum and several skin breakdown on her feet but these are not changed recently. 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 Her  husband and herself have prior relationship with palliative care Dr. Hilma Favors who was involved with their care previous admission.   CIR Course: Admitted to CIR 08/23/19 after prolonged hospital stay from 07/03/19-08/23/19. With decline in ability to participate in therapy and more drowsiness and less alertness TRH consulted 08/28/19 and she was re-admitted due to no improvement on 08/28/2019.   Review of Systems: As per HPI otherwise 10 point review of systems negative.   Past Medical History:  Diagnosis Date  . Atrial fibrillation with RVR ([place])   . 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 ESRD on hemodialysis ([place])   . Hypertension   . Pericardial effusion 06/2019    Past Surgical History:  Procedure Laterality Date  . APPENDECTOMY    . IR FLUORO GUIDE CV LINE LEFT  07/18/2019  . IR GASTROSTOMY TUBE MOD SED  07/28/2019  . IR US GUIDE VASC ACCESS LEFT  07/18/2019  . NECK SURGERY    . RIGHT AND LEFT HEART CATH N/A 07/06/2019   Procedure: RIGHT AND LEFT HEART CATH;  Surgeon: Jolaine Artist, MD;  Location: [facility];  Service: Cardiovascular;  Laterality: N/A;  . TEE WITHOUT CARDIOVERSION N/A 03/28/2019   Procedure: TRANSESOPHAGEAL ECHOCARDIOGRAM (TEE);  Surgeon: Geralynn Rile, MD;  Location: [facility];  Service: Cardiology;  Laterality: N/A;  . VIDEO ASSISTED THORACOSCOPY Right 07/12/2019   Procedure: VIDEO ASSISTED THORACOSCOPY Pericardial window ;  Surgeon: Lajuana Matte, MD;  Location: [facility];  Service: Thoracic;  Laterality: Right;  reports that she quit smoking about 40 years ago. Her smoking use included cigarettes. [name]. She has never used smokeless tobacco. [name].  No Known Allergies  Family History  Problem Relation Age of Onset  . Hypertension Mother     Prior to Admission medications   Medication Sig Start Date End Date Taking? Authorizing Provider  acetaminophen  (TYLENOL) 160 MG/5ML solution Take 15.6 mLs (500 mg total) by mouth every 6 (six) hours as needed. 08/23/19   Pokhrel, Corrie Mckusick, MD  ALPRAZolam Duanne Moron) 0.25 MG tablet Take 1 tablet (0.25 mg total) by mouth 3 (three) times daily as needed for anxiety (give prior to hemodialysis). 08/23/19   Pokhrel, Corrie Mckusick, MD  Amino Acids-Protein Hydrolys (FEEDING SUPPLEMENT, PRO-STAT SUGAR FREE 64,) LIQD Place 30 mLs into feeding tube daily. 08/24/19   Pokhrel, Corrie Mckusick, MD  antiseptic oral rinse (BIOTENE) LIQD 15 mLs by Mouth Rinse route as needed for dry mouth. 08/23/19   Pokhrel, Corrie Mckusick, MD  bisacodyl (DULCOLAX) 10 MG suppository Place 1 suppository (10 mg total) rectally daily as needed for moderate constipation. 08/23/19   Pokhrel, Corrie Mckusick, MD  bisacodyl (DULCOLAX) 5 MG EC tablet Take 2 tablets (10 mg total) by mouth daily as needed for moderate constipation. 08/23/19   Pokhrel, Corrie Mckusick, MD  camphor-menthol [facility]) lotion Apply topically as needed for itching. 08/23/19   Pokhrel, Corrie Mckusick, MD  collagenase (SANTYL) ointment Apply topically 2 (two) times daily. 08/23/19   Pokhrel, Corrie Mckusick, MD  diclofenac Sodium (VOLTAREN) 1 % GEL Apply 2 g topically 2 (two) times daily. 08/23/19   Pokhrel, Corrie Mckusick, MD  diltiazem (CARDIZEM CD) 120 MG 24 hr capsule Take 1 capsule (120 mg total) by mouth daily. 08/23/19   Pokhrel, Corrie Mckusick, MD  famotidine (PEPCID) 10 MG tablet Take 1 tablet (10 mg total) by mouth daily. 08/24/19   Pokhrel, Corrie Mckusick, MD  ferric citrate (AURYXIA) 1 GM 210 MG(Fe) tablet Take 2 tablets (420 mg total) by mouth 3 (three) times daily with meals. 08/23/19   Pokhrel, Corrie Mckusick, MD  gabapentin (NEURONTIN) 250 MG/5ML solution Place 2 mLs (100 mg total) into feeding tube every 12 (twelve) hours. 08/23/19   Pokhrel, Corrie Mckusick, MD  Hydrocortisone (GERHARDT'S BUTT CREAM) CREA Apply 1 application topically 3 (three) times daily. 08/23/19   Pokhrel, Corrie Mckusick, MD  hydrOXYzine (ATARAX/VISTARIL) 25 MG tablet Take 0.5 tablets (12.5 mg total) by mouth 2 (two) times daily  as needed for itching. 08/23/19   Pokhrel, Corrie Mckusick, MD  loratadine (CLARITIN) 10 MG tablet Take 1 tablet (10 mg total) by mouth daily. 08/24/19   Pokhrel, Corrie Mckusick, MD  multivitamin (RENA-VIT) TABS tablet Take 1 tablet by mouth at bedtime. 08/23/19   Pokhrel, Corrie Mckusick, MD  Nutritional Supplements (FEEDING SUPPLEMENT, NEPRO CARB STEADY,) LIQD Place 1,000 mLs into feeding tube continuous. 08/23/19   Pokhrel, Corrie Mckusick, MD  nystatin (MYCOSTATIN) 100000 UNIT/ML suspension Use as directed 5 mLs (500,000 Units total) in the mouth or throat 4 (four) times daily. 08/23/19   Pokhrel, Corrie Mckusick, MD  oxyCODONE (OXY IR/ROXICODONE) 5 MG immediate release tablet Take 1 tablet (5 mg total) by mouth every 8 (eight) hours as needed for moderate pain. 08/23/19   Pokhrel, Corrie Mckusick, MD  pantoprazole sodium (PROTONIX) 40 mg/20 mL PACK Place 20 mLs (40 mg total) into feeding tube 2 (two) times daily. 08/23/19   Pokhrel, Corrie Mckusick, MD  polyethylene glycol (MIRALAX / GLYCOLAX) 17 g packet Take 17 g by mouth daily. 08/24/19  Pokhrel, [place], MD  predniSONE (DELTASONE) 10 MG tablet Take 5 tablets (50 mg total) by mouth daily for 6 days, THEN 4 tablets (40 mg total) daily for 7 days, THEN 3 tablets (30 mg total) daily. 08/23/19 10/05/19  Pokhrel, Corrie Mckusick, MD  Propylene Glycol (SYSTANE BALANCE) 0.6 % SOLN Place 1 drop into both eyes 2 (two) times daily as needed (dry eyes).    [provider]  senna-docusate (SENOKOT-S) 8.6-50 MG tablet Take 1 tablet by mouth 2 (two) times daily. 08/23/19   Pokhrel, Corrie Mckusick, MD  sertraline (ZOLOFT) 25 MG tablet Take 25 mg by mouth 2 (two) times daily.  05/17/19 05/16/20  [provider]  thiamine 100 MG tablet Take 1 tablet (100 mg total) by mouth daily. 08/24/19   Pokhrel, Corrie Mckusick, MD    Physical Exam: Vitals:   09/04/2019 1255  BP: (!) 142/85  Pulse: 86  Resp: 20  Temp: (!) 97.5 F (36.4 C)  TempSrc: Axillary  SpO2: 90%   Constitutional: NAD, lying in bed, responds with eyes to questions Vitals:    09/13/2019 1255  BP: (!) 142/85  Pulse: 86  Resp: 20  Temp: (!) 97.5 F (36.4 C)  TempSrc: Axillary  SpO2: 90%   Eyes: PERRL, lids and conjunctivae normal ENMT: Mucous membranes are not assessed  Neck: normal, supple, no masses, no thyromegaly Respiratory: clear to auscultation bilaterally, no wheezing. Poor respiratory effort. No accessory muscle use.  Cardiovascular: Regular rate and rhythm, no murmurs / rubs / gallops. No extremity edema.  Abdomen: no tenderness, no masses palpated.has feeding tube in place without leakage or signs of redness/infection or purulent discharge Musculoskeletal: some contractures, skin tight consistent with scleroderma Skin: pressure ulcer stage 3 sacrum, pressure sores unstaged on both feet  Neurologic: CN 2-12 grossly intact. No change from prior, moves all 4 limbs Psychiatric: awake and oriented to person, cannot communicate verbally  Labs on Admission: I have personally reviewed following labs and imaging studies  CBC: Recent Labs  Lab 08/23/19 0259 08/24/19 1315 08/28/19 0037 08/28/19 0534  WBC 3.3* 3.7* 4.8 6.3  NEUTROABS  --   --   --  4.7  HGB 12.7 10.4* 11.3* 12.0  HCT 44.2 36.1 37.5 41.1  MCV 95.5 95.5 93.5 95.8  PLT 128* 119* 146* 694*   Basic Metabolic Panel: Recent Labs  Lab 08/23/19 0259 08/24/19 1315 08/28/19 0542  NA 133* 130* 125*  K 4.7 4.3 5.0  CL 93* 90* 88*  CO2 24 26 20*  GLUCOSE 122* 100* 79  BUN 72* 119* 81*  CREATININE 2.33* 3.22* 2.31*  CALCIUM 8.8* 8.8* 9.2  PHOS 2.8 3.1 2.5   GFR: Estimated Creatinine Clearance: 14.3 mL/min (A) (by C-G formula based on SCr of 2.31 mg/dL (H)). Liver Function Tests: Recent Labs  Lab 08/23/19 0259 08/24/19 1315 08/28/19 0534 08/28/19 0542  AST  --   --  52*  --   ALT  --   --  25  --   ALKPHOS  --   --  121  --   BILITOT  --   --  0.7  --   PROT  --   --  5.7*  --   ALBUMIN 2.8* 2.5* 2.9* 2.8*   No results for input(s): LIPASE, AMYLASE in the last 168  hours. Recent Labs  Lab 08/28/19 1238  AMMONIA 33   Coagulation Profile: No results for input(s): INR, PROTIME in the last 168 hours. Cardiac Enzymes: No results for input(s): CKTOTAL, CKMB, CKMBINDEX, TROPONINI  in the last 168 hours. BNP (last 3 results) No results for input(s): PROBNP in the last 8760 hours. HbA1C: No results for input(s): HGBA1C in the last 72 hours. CBG: Recent Labs  Lab 08/22/19 1932 08/22/19 2344 08/23/19 0324 08/23/19 1433 08/23/19 1648  GLUCAP 143* 106* 125* 128* 125*   Lipid Profile: No results for input(s): CHOL, HDL, LDLCALC, TRIG, CHOLHDL, LDLDIRECT in the last 72 hours. Thyroid Function Tests: Recent Labs    08/28/19 0542  TSH 2.259   Anemia Panel: No results for input(s): VITAMINB12, FOLATE, FERRITIN, TIBC, IRON, RETICCTPCT in the last 72 hours. Urine analysis:    Component Value Date/Time   COLORURINE YELLOW 07/09/2019 1650   APPEARANCEUR HAZY (A) 07/09/2019 1650   LABSPEC 1.012 07/09/2019 1650   PHURINE 5.0 07/09/2019 1650   GLUCOSEU NEGATIVE 07/09/2019 1650   HGBUR NEGATIVE 07/09/2019 1650   BILIRUBINUR NEGATIVE 07/09/2019 1650   [place] 07/09/2019 1650   PROTEINUR 30 (A) 07/09/2019 1650   NITRITE NEGATIVE 07/09/2019 1650   LEUKOCYTESUR MODERATE (A) 07/09/2019 1650    Radiological Exams on Admission: CT HEAD WO CONTRAST  Result Date: 08/28/2019 CLINICAL DATA:  Altered mental status (AMS), unclear cause. EXAM: CT HEAD WITHOUT CONTRAST TECHNIQUE: Contiguous axial images were obtained from the base of the skull through the vertex without intravenous contrast. COMPARISON:  Head CT 08/02/2019 FINDINGS: Brain: No evidence of acute intracranial hemorrhage. No demarcated cortical infarction. No evidence of intracranial mass. No midline shift or extra-axial fluid collection. Mild ill-defined hypoattenuation within the cerebral white matter is nonspecific, but consistent with chronic small vessel ischemic disease. A small chronic  lacunar infarct is questioned within the left basal ganglia, unchanged from prior exam (series 3, image 18). Mild generalized parenchymal atrophy. Vascular: No hyperdense vessel.  Atherosclerotic calcifications. Skull: Normal. Negative for fracture or focal lesion. Sinuses/Orbits: Visualized orbits demonstrate no acute abnormality. No significant paranasal sinus disease or mastoid effusion at the imaged levels. IMPRESSION: No evidence of acute intracranial abnormality. Stable, mild generalized parenchymal atrophy and chronic small vessel ischemic disease. A small chronic lacunar infarct is questioned within the left basal ganglia, unchanged. Electronically Signed   By: Kellie Simmering DO   On: 08/28/2019 14:37   US Abdomen Complete  Result Date: 08/28/2019 CLINICAL DATA:  Abdominal pain. EXAM: ABDOMEN ULTRASOUND COMPLETE COMPARISON:  August 05, 2019. FINDINGS: Gallbladder: No gallstones or wall thickening visualized. No sonographic Murphy sign noted by sonographer. Sludge is noted within gallbladder lumen. Common bile duct: Diameter: 4 mm which is within normal limits. Liver: No focal lesion identified. Within normal limits in parenchymal echogenicity. Portal vein is patent on color Doppler imaging with normal direction of blood flow towards the liver. IVC: No abnormality visualized. Pancreas: Visualized portion unremarkable. Spleen: Size and appearance within normal limits. Right Kidney: Length: 9.1 cm. Echogenicity within normal limits. No mass or hydronephrosis visualized. Left Kidney: Length: 8.8 cm. Echogenicity within normal limits. No mass or hydronephrosis visualized. Abdominal aorta: No aneurysm visualized. Other findings: None. IMPRESSION: No significant abnormality seen in the abdomen. Electronically Signed   By: Marijo Conception M.D.   On: 08/28/2019 15:08   DG CHEST PORT 1 VIEW  Result Date: 08/28/2019 CLINICAL DATA:  Post dialysis. Evaluate for fluid overload prior to fluid administration. EXAM:  PORTABLE CHEST 1 VIEW COMPARISON:  CT chest 08/08/2019 and chest radiograph 08/06/2019. FINDINGS: Patient is rotated. Trachea is midline. Heart is enlarged. Left IJ dialysis catheter is in the right atrium. Mild diffuse mixed interstitial and airspace opacification.  Small left pleural effusion. Trace right pleural fluid. Biapical pleural thickening. IMPRESSION: Congestive heart failure. Electronically Signed   By: Lorin Picket M.D.   On: 08/28/2019 13:45    EKG: Independently reviewed no changes from prior, sinus. 08/28/2019 done at CIR  Assessment/Plan Principal Problem:   Failure to thrive in adult Active Problems:   Hypertension   Protein-calorie malnutrition, severe   Pressure injury of skin   Dysphagia   ESRD on hemodialysis (HCC)   Scleroderma progressive (HCC)   CREST syndrome (HCC)   Anemia of chronic disease   Stage III pressure ulcer of sacral region ([place])  Failure to thrive in adult: -unclear change in mental status -lactic acid trending up and repeated on admission normal so will hold off on antibiotics for now without clear source for infection -CXR negative for pneumonia, CT head without evidence for stroke -she is anuric, no blood cultures drawn so ordered -could be progression of scleroderma or related to dialysis  ESRD on HD: -renal following and due for HD 09/11/2019 -they plan to do HD today and continue following -she does not appear to be grossly volume overloaded from exam  Pressure injury: -stage 3 sacrum  -pressure wounds on the feet bilateral as well  Dysphagia: -resumed tube feeds with nepro -appreciate nutrition input -NPO for now given mental status change  Scleroderma progressive/CREST: -increase steroids to solu-medrol 125 mg IV TID after consultation with Dr. Hilma Favors she has responded to this dosing previously -spoke with husband that this is a progressive disease and if we do not find infection or see improvement in 48 hours she could have  progression of this disease -palliative care consult for continuity  Anemia chronic disease: -no clinical signs of blood loss -Hg levels stable -recheck CBC today  Hypertension: -hold diltiazem for now given BP readings normal -resume if needed  Protein calorie malnutrition, severe: -albumin trending down -nutrition consult for tube feeds  DVT prophylaxis: SCDs Code Status: Full Family Communication: updated husband in person Disposition Plan: unclear at least 2 midnights Consults called: nephrology on board already will continue to follow Admission status: inpatient  Hoyt Koch MD [facility] Pager [phone]   If 7PM-7AM, please contact night-coverage [email or web address] Use universal [place] password for that web site. If you do not have the password, please call the hospital operator.  08/24/2019, 1:38 PM

## 2019-08-29 NOTE — Progress Notes (Addendum)
Initial Nutrition Assessment  RD working remotely.  DOCUMENTATION CODES:   Severe malnutrition in context of chronic illness  INTERVENTION:   -Continue renal MVI daily -Initiate Nepro @ 45 ml/hr via g-tube   Tube feeding regimen provides 1944 kcal (100% of needs), 87 grams of protein, and 785 ml of H2O.   NUTRITION DIAGNOSIS:   Severe Malnutrition related to chronic illness(ESRD on HD, scleroderma) as evidenced by moderate muscle depletion, severe muscle depletion, moderate fat depletion, severe fat depletion.  GOAL:   Patient will meet greater than or equal to 90% of their needs  MONITOR:   PO intake, Supplement acceptance, Diet advancement, Labs, Weight trends, TF tolerance, Skin  REASON FOR ASSESSMENT:   Consult Enteral/tube feeding initiation and management  ASSESSMENT:   Joanna Ali is a 69 y.o. female with medical history significant of extended hospital stay 07/03/19-08/23/19 and then discharged to CIR. She did undergo dialysis 08/26/19 and afterwards is normally tired and sleepy for 5-6 hours per husband. Her husband does help to provide all history as patient cannot as well as her rehab MD. She was sleepy most of that day and into the weekend. She never really regained her functional status. She at maximum improvement at CIR was able to sit up at the side of the bed. Since that time she has not been able to move much and is sleeping most of the day. She does have new diagnosis of progressive scleroderma/CREST from recent admission and was on steroid taper. That was decreased to 30 mg prednisone daily several days prior to decline in function. No other recent medication changes have been made. Initially she was having some tenderness in her stomach but this is now resolved. She does have indwelling tube feed and temporary dialysis catheter but no pain or discharge from those locations or redness. She has not had fevers or chills at CIR. She denies chest pains or SOB. No change  in her breathing per staff and husband. No new diarrhea, nausea, vomiting. She does not make urine and does HD Tu/Th/Sat. Most recent HD 08/25/18. She does not have new swelling in feet or ankles. She does have a pressure wound on sacrum and several skin breakdown on her feet but these are not changed recently. 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 Her husband and herself have prior relationship with palliative care Dr. Hilma Favors who was involved with their care previous admission.  Pt admitted with failure to thrive in adult.   12/15- s/p bilateral thoracentesiswith700 ml fluid removed 12/17- s/p cardiac cath 12/23- s/p VATS, pericardial window 12/30- Cortrak placed, tip in stomach 01/7- TF switched to Nepro due to persistent hyperkalemia, Cortrak removed 01/8- s/p G-tube placement 01/9- steroids initiated 01/12- s/p bilateral thoracentesis with 1600 ml total fluid removed  Pt transferred from CIR. Pt with changes in mental status. She is very lethargic and has difficulty opening her mouth.   Pt was on a dysphagia 3 diet in CIR, but only taking bites and sips. She was receiving the majority of her nutrition via g-tube feedings.   Palliative care consulted for goals of care. Per nephrology notes, may need to transition to comfort care/ discontinue HD if mental status does not improve.   Pt with ongoing malnutrition. Nutrition-focused physical exam performed on 08/24/19 revealed moderate to severe fat and muscle depletion. RD does not suspect significant changes to exam.   Medications reviewed and include thiamine.  Labs reviewed: Na: 125, K: 5.6.   Diet Order:   Diet Order            Diet clear liquid Room service appropriate? Yes; Fluid consistency: Thin  Diet effective now              EDUCATION NEEDS:   No education needs have been identified at this time  Skin:  Skin Integrity Issues:: Incisions,  Unstageable, Stage II Stage II: - Stage III: coccyx Unstageable: nose Incisions: closed rt chest Other: -  Last BM:  08/28/2019  Height:   Ht Readings from Last 1 Encounters:  07/04/19 5' 6"  (1.676 m)    Weight:   Wt Readings from Last 1 Encounters:  09/14/2019 55 kg    Ideal Body Weight:  59.1 kg  BMI:  Body mass index is 19.57 kg/m.  Estimated Nutritional Needs:   Kcal:  1800-2000  Protein:  90-105 grams  Fluid:  1000 ml + UOP    Loistine Chance, RD, LDN, [place] Registered Dietitian II Certified Diabetes Care and Education Specialist Please refer to [facility] for RD and/or RD on-call/weekend/after hours pager

## 2019-08-29 NOTE — Progress Notes (Signed)
Patient ID: Joanna Ali, female   DOB: 1950-08-18, 69 y.o.   MRN: [id number]  Late entry for patient seen in 4M13 at 0855AM prior to her transfer to [facility] Progress Note   Assessment/ Plan:   1.  Altered mental status/encephalopathy: She has remained encephalopathic overnight with minimal verbal responses.  Ammonia level not significantly elevated and CT scan of the head without acute intracranial process.  However lactic acid level remains marginally elevated and she is being transferred to the medical floor from CIR for additional work-up and management of this. 2. ESRD: With acute kidney injury that has been dialysis dependent now for the past 6 weeks or so.  Permanent dialysis access placement limited by her Raynaud's and evidence of vascular insufficiency with continued hemodialysis via TDC.  Plans in place for dialysis later today (to continue Monday/Wednesday/Friday schedule).  Unfortunately, overall prognosis appears to be poor and she appears to be declining rapidly from a clinical standpoint without obvious explanation.  Goals of care may be need to be revisited. 3. Anemia: Without overt blood loss, rising hemoglobin/hematocrit appears to be reflective of hemoconcentration. 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 5. Nutrition: With low albumin level and reported poor intake secondary to encephalopathy. 6. Hypertension: Blood pressure within acceptable range  Subjective:   Without verbal response to questions.  Seen with nurse and NT at bedside who were repositioning patient.   Objective:   BP (!) 142/85 (BP Location: Left Arm)   Pulse 86   Temp (!) 97.5 F (36.4 C) (Axillary)   Resp 20   SpO2 90%   Physical Exam: Gen: Laying comfortably with her eyes half shot, difficult to elicit any verbal response CVS: Pulse regular rhythm, normal rate, S1 and S2 normal Resp: Clear to auscultation, no rales/rhonchi Abd:  Soft, flat, nontender Ext: 1+ bilateral lower extremity edema, right IJ TDC.  Labs: BMET Recent Labs  Lab 08/23/19 0259 08/24/19 1315 08/28/19 0542  NA 133* 130* 125*  K 4.7 4.3 5.0  CL 93* 90* 88*  CO2 24 26 20*  GLUCOSE 122* 100* 79  BUN 72* 119* 81*  CREATININE 2.33* 3.22* 2.31*  CALCIUM 8.8* 8.8* 9.2  PHOS 2.8 3.1 2.5   CBC Recent Labs  Lab 08/23/19 0259 08/24/19 1315 08/28/19 0037 08/28/19 0534  WBC 3.3* 3.7* 4.8 6.3  NEUTROABS  --   --   --  4.7  HGB 12.7 10.4* 11.3* 12.0  HCT 44.2 36.1 37.5 41.1  MCV 95.5 95.5 93.5 95.8  PLT 128* 119* 146* 148*      Medications:    . feeding supplement (NEPRO CARB STEADY)  1,000 mL Per Tube Q24H  . methylPREDNISolone (SOLU-MEDROL) injection  80 mg Intravenous Q12H  . multivitamin  1 tablet Oral QHS  . [START ON 08/30/2019] thiamine  100 mg Oral Daily   Elmarie Shiley, MD 08/28/2019, 1:30 PM

## 2019-08-29 NOTE — Progress Notes (Signed)
Patient in bed, alert and responds to voice. Does not answer any questions. VS WNL. Order received to discharge patient from [facility] to [phone]. Notified therapy team of discharge. Patient will proceed to dialysis, dialysis nurse notified of situation. Notified husband, he is at bedside. Will continue plan of care until patient is off of unit. Awaiting for callback from nurse tammy on [place]. No s/s of distress noted at this time.  Audie Clear, LPN

## 2019-08-29 NOTE — Progress Notes (Signed)
CBG recheck at 98. Alert, administered PRN pain medication with pain at 6/10 on face pain scale.  Will monitor

## 2019-08-29 NOTE — Progress Notes (Signed)
Occupational Therapy Session Note  Patient Details  Name: Ona Roehrs MRN: [id number] Date of Birth: [birth year]  Today's Date: 08/28/2019 OT Individual Time: 3014-9969 OT Individual Time Calculation (min): 15 min    Short Term Goals: Week 1:  OT Short Term Goal 1 (Week 1): Patient will complete bed mobility with CS OT Short Term Goal 2 (Week 1): patient will complete sit to stand and SPT with mod A OT Short Term Goal 3 (Week 1): patient will complete UB bathing/dressing with mod A OT Short Term Goal 4 (Week 1): Patient will tolerate sitting upright for 1 hour and perform weight shifts with min a  Skilled Therapeutic Interventions/Progress Updates:    patient in bed, able to answer general questions, having difficulty keeping her eyes open.  Assisted with washing back.  Attempted repositioning but patient unable to tolerate.  Attempted bilateral hand mobilization - patient notes pain and she presents with poor active ROM today - unable to hold wash cloth with either hand.  She missed 45 minutes of therapy session due to fatigue and medical status - MD aware.    Therapy Documentation Precautions:  Precautions Precautions: Fall Precaution Comments: PEG tube, sacral wound, watch BP with mobility, maintain systolic <249 mmHg Restrictions Weight Bearing Restrictions: No General: General OT Amount of Missed Time: 45 Minutes Vital Signs: Therapy Vitals Temp: (!) 97.5 F (36.4 C) Temp Source: Oral Pulse Rate: 81 Resp: 20 BP: (!) 138/91 Patient Position (if appropriate): Lying Oxygen Therapy SpO2: 94 % O2 Device: Room Air Pain: Pain Assessment Pain Scale: Faces Pain Score: Asleep Faces Pain Scale: Hurts even more Pain Type: Chronic pain Other Treatments:     Therapy/Group: Individual Therapy  Carlos Levering 09/11/2019, 8:21 AM

## 2019-08-29 NOTE — Progress Notes (Signed)
[facility] PHYSICAL MEDICINE & REHABILITATION PROGRESS NOTE   Subjective/Complaints:  Pt only said 1 word this AM, and took 4x- said "No" to doing therapy this AM and also shook her head no when asked if she had abd pain/nausea.   Pt appears very sedated; machines beeping for pulse ox that cannot pick up pulse even though was palpable- in 80s.    ROS: hard to assess due to pt's mentation Objective:   CT HEAD WO CONTRAST  Result Date: 08/28/2019 CLINICAL DATA:  Altered mental status (AMS), unclear cause. EXAM: CT HEAD WITHOUT CONTRAST TECHNIQUE: Contiguous axial images were obtained from the base of the skull through the vertex without intravenous contrast. COMPARISON:  Head CT 08/02/2019 FINDINGS: Brain: No evidence of acute intracranial hemorrhage. No demarcated cortical infarction. No evidence of intracranial mass. No midline shift or extra-axial fluid collection. Mild ill-defined hypoattenuation within the cerebral white matter is nonspecific, but consistent with chronic small vessel ischemic disease. A small chronic lacunar infarct is questioned within the left basal ganglia, unchanged from prior exam (series 3, image 18). Mild generalized parenchymal atrophy. Vascular: No hyperdense vessel.  Atherosclerotic calcifications. Skull: Normal. Negative for fracture or focal lesion. Sinuses/Orbits: Visualized orbits demonstrate no acute abnormality. No significant paranasal sinus disease or mastoid effusion at the imaged levels. IMPRESSION: No evidence of acute intracranial abnormality. Stable, mild generalized parenchymal atrophy and chronic small vessel ischemic disease. A small chronic lacunar infarct is questioned within the left basal ganglia, unchanged. Electronically Signed   By: Kellie Simmering DO   On: 08/28/2019 14:37   US Abdomen Complete  Result Date: 08/28/2019 CLINICAL DATA:  Abdominal pain. EXAM: ABDOMEN ULTRASOUND COMPLETE COMPARISON:  August 05, 2019. FINDINGS: Gallbladder: No  gallstones or wall thickening visualized. No sonographic Murphy sign noted by sonographer. Sludge is noted within gallbladder lumen. Common bile duct: Diameter: 4 mm which is within normal limits. Liver: No focal lesion identified. Within normal limits in parenchymal echogenicity. Portal vein is patent on color Doppler imaging with normal direction of blood flow towards the liver. IVC: No abnormality visualized. Pancreas: Visualized portion unremarkable. Spleen: Size and appearance within normal limits. Right Kidney: Length: 9.1 cm. Echogenicity within normal limits. No mass or hydronephrosis visualized. Left Kidney: Length: 8.8 cm. Echogenicity within normal limits. No mass or hydronephrosis visualized. Abdominal aorta: No aneurysm visualized. Other findings: None. IMPRESSION: No significant abnormality seen in the abdomen. Electronically Signed   By: Marijo Conception M.D.   On: 08/28/2019 15:08   DG CHEST PORT 1 VIEW  Result Date: 08/28/2019 CLINICAL DATA:  Post dialysis. Evaluate for fluid overload prior to fluid administration. EXAM: PORTABLE CHEST 1 VIEW COMPARISON:  CT chest 08/08/2019 and chest radiograph 08/06/2019. FINDINGS: Patient is rotated. Trachea is midline. Heart is enlarged. Left IJ dialysis catheter is in the right atrium. Mild diffuse mixed interstitial and airspace opacification. Small left pleural effusion. Trace right pleural fluid. Biapical pleural thickening. IMPRESSION: Congestive heart failure. Electronically Signed   By: Lorin Picket M.D.   On: 08/28/2019 13:45   Recent Labs    08/28/19 0037 08/28/19 0534  WBC 4.8 6.3  HGB 11.3* 12.0  HCT 37.5 41.1  PLT 146* 148*   Recent Labs    08/28/19 0542  NA 125*  K 5.0  CL 88*  CO2 20*  GLUCOSE 79  BUN 81*  CREATININE 2.31*  CALCIUM 9.2    Intake/Output Summary (Last 24 hours) at 09/12/2019 6389 Last data filed at 08/31/2019 0400 Gross per 24 hour  Intake 1880 ml  Output --  Net 1880 ml     Physical Exam: Vital  Signs Blood pressure 137/80, pulse 78, temperature (!) 97.5 F (36.4 C), temperature source Oral, resp. rate 20, weight 40 kg, SpO2 94 %.   Physical Exam  Vitals reviewed and labs reviewed. Constitutional: Emaciated. Cachetic; shiny skin due to scleroderma all over- like skin pulled tight; supine; getting TF but bottle empty for TFs;  NAD. Appears very weak and tired this morning, less responsive; less able to respond, so very weak, much more concerning HENT:  Head: Normocephalic and atraumatic.  Eyes: conjugate gaze when opens eyes  Neck: No tracheal deviation present.   CV: RRR Respiratory:CTA B/L- not coarse or have wheezes, but decreased breath sounds- shallow breathing of note- still shallow GI: soft, NT, ND, (+)hypoactive BS Musculoskeletal:     Comments: No edema or tenderness in extremities  Neurological: She is alert.  Motor: Bilateral upper extremities: 3+-4-/5 proximal distal Bilateral lower extremities: Hip flexion 3+-4-/5, knee extension 3 -/5 (limited due to contractures), ankle dorsiflexion 3-3+/5 Sensation intact light touch  Skin:  Scattered abrasions Peripheral cyanosis - very cold hands and feet- appears blue tinged Psychiatric: Her affect is blunt. Her speech is delayed. She is slowed. Barely able to verbally respond.   Assessment/Plan: 1. Functional deficits secondary to debility due to pericardia windown and pericardial effusion/ Scleroderma/Raynaud's/new ESRD on HD which require 3+ hours per day of interdisciplinary therapy in a comprehensive inpatient rehab setting.  Physiatrist is providing close team supervision and 24 hour management of active medical problems listed below.  Physiatrist and rehab team continue to assess barriers to discharge/monitor patient progress toward functional and medical goals  Care Tool:  Bathing    Body parts bathed by patient: Chest, Right upper leg, Left upper leg, Face, Left arm, Right arm   Body parts bathed by helper:  Right arm, Left arm, Chest, Abdomen, Front perineal area, Buttocks, Right upper leg, Left upper leg, Right lower leg, Left lower leg, Face     Bathing assist Assist Level: Total Assistance - Patient < 25%     Upper Body Dressing/Undressing Upper body dressing Upper body dressing/undressing activity did not occur (including orthotics): Safety/medical concerns What is the patient wearing?: Pull over shirt    Upper body assist Assist Level: Total Assistance - Patient < 25%    Lower Body Dressing/Undressing Lower body dressing      What is the patient wearing?: Pants, Incontinence brief     Lower body assist Assist for lower body dressing: Total Assistance - Patient < 25%     Toileting Toileting    Toileting assist Assist for toileting: Dependent - Patient 0%     Transfers Chair/bed transfer  Transfers assist     Chair/bed transfer assist level: Total Assistance - Patient < 25%     Locomotion Ambulation   Ambulation assist   Ambulation activity did not occur: Safety/medical concerns(Limited due to lethargy and elevated BP)          Walk 10 feet activity   Assist  Walk 10 feet activity did not occur: Safety/medical concerns(Limited due to lethargy and elevated BP)        Walk 50 feet activity   Assist Walk 50 feet with 2 turns activity did not occur: Safety/medical concerns(Limited due to lethargy and elevated BP)         Walk 150 feet activity   Assist Walk 150 feet activity did not occur: Safety/medical concerns(Limited due to  lethargy and elevated BP)         Walk 10 feet on uneven surface  activity   Assist Walk 10 feet on uneven surfaces activity did not occur: Safety/medical concerns(Limited due to lethargy and elevated BP)         Wheelchair     Assist     Wheelchair activity did not occur: Safety/medical concerns(Limited due to lethargy and elevated BP)         Wheelchair 50 feet with 2 turns  activity    Assist    Wheelchair 50 feet with 2 turns activity did not occur: Safety/medical concerns(Limited due to lethargy and elevated BP)       Wheelchair 150 feet activity     Assist  Wheelchair 150 feet activity did not occur: Safety/medical concerns(Limited due to lethargy and elevated BP)       Blood pressure 137/80, pulse 78, temperature (!) 97.5 F (36.4 C), temperature source Oral, resp. rate 20, weight 40 kg, SpO2 94 %.  Medical Problem List and Plan: 1.  Deficits with mobility, transfers, endurance, self-care secondary to debility.              -patient may may shower             -ELOS/Goals: 13-17 days/Supervision             -Patient is exhausted and does not appear to be benefiting from CIR at this time. Spoke with therapists, RN, PA, IM hospitalist, and husband at bedside. Recommend that she be transferred to acute care as team feels therapy may be hurting more than helping her at this time.  2/9- pt is less awake, less responsive- wasn't able to do therapy Monday and declined today; Lactic Acid 3.9- d/w Dr Sharlet Salina that pt needs to go back to acute to figure out  If anything can be done for level of function, responsiveness, awareness.  2.  Antithrombotics: -DVT/anticoagulation:  Pharmaceutical: Heparin             -antiplatelet therapy: N/A 3. Peripheral neuropathy/Pain Management:  Tylenol bid with oxycodone prn.   4. Mood: Team to provide ego support. LCSW to follow for evaluation and support.              -antipsychotic agents: N/A 5. Neuropsych: This patient is not fully capable of making decisions on her own behalf. 6. 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 Add Vitamin C/Zinc for healing. ON tube feeds for nutritional support due to FTT. Air mattress overlay.  7. Fluids/Electrolytes/Nutrition:  Strict I/O with daily weights. Regal diet with 1500 cc FR.  8. 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 Cont Cardizem. 9. A fib with RVR: [facility]. Monitor HR tid --rate controlled on Cardizem.             Monitor with increased mobility.  10. Leucopenia: Stable--follow up with Heme/onc for work up after discharge.                    CBC ordered for tomorrow.  2/4- WBC is 3.3- will monitor  2/8 WBC is 6.3; stable.  11. Metabolic encephalopathy: Treated with IV thiamine and has improved with decrease  in narcotics/sedatives as well as initiation of HD  2/7- will add TSH to AM labs to see if could be why so sedated/lethargic  2/8: TSH is 2.259; within normal limits but much less than 1/13 elevated value. Will obtain IM consult today regarding lab abnormalities/weakness.  12. New ESRD: HD dependent due to progression of AKI. Has been accepted by [facility].   2/6- Renal hasn't pursued perm access due to Raynaud's comorbidities.  13. Pericardial effusion/Pleural effusion:  S/p pericardial window 12/2. S/p thoracocentesis   2/5- sounds tight- ordered Duonebs q4 hours prn  2/6- sats 100% 14. Hepatic Steatosis: Abdominal pain with pruritis. Followed at [facility].               CMP ordered for tomorrow AM  Ordered renal function panel since on HD- will add LFTs to this for Monday 15. Severe protein-calorie Malnutrition: Encourage po intake as able. Continue tube feeds.  16. Anemia of chronic disease:             CBC ordered for tomorrow AM  2/4- Hb up to 12.7k-   2/8: Hgb 12.  17. Anxiety disorder with panic attacks: On Zoloft with alprazolam tid prn.  18. Thrombocytopenia: Stable without signs of bleeding.              CBC ordered for tomorrow AM  2/4- 128k-will con't regimen and monitor  2/8: improved to 148 19. Hyponatremia  2/4- up to 133  from 124- will monitor  2/8: Decreased to 125. Likely etiology of her weakness. Will obtain IM consult.  20- Lethargy/poor energy- can be worse with Xanax- will monitor  2/7- will also check TSH as well.  21. Hemorrhoids/Fecal Hemeoccult (+)  2/6- ordered Anusol suppositories for hemorrhoids.   2/7- hemeoccult (+)- could be due to hemorrhoids, however will recheck Hb in AM- if dropping, need to consult GI.   2/8: Hemeoccult positive. Hgb improved to 12.0.  22. Abdominal Pain: 2/8 will obtain portable abdominal US. Patient unable to show where in her abdomen pain is worst due to her fatigue  23. Nausea: 2/8 No longer receiving benefit from Zofran. Will add Companzine po to alternate with Zofran. If this doesn't provide relief, consider IV Zofran/GI consult.       LOS: 6 days A FACE TO FACE EVALUATION WAS PERFORMED  Collis Thede 08/30/2019, 9:07 AM

## 2019-08-29 NOTE — Procedures (Signed)
Patient seen on Hemodialysis. BP 133/75   Pulse 89   Temp (!) 97.1 F (36.2 C) (Axillary)   Resp 18   Wt 55 kg   SpO2 92%   BMI 19.57 kg/m   QB 400, UF goal 0.5L Tolerating treatment---remains lethargic/somnolent--awakens to painful stimuli without verbal response. I informed her husband that we may need to discontinue dialysis if her mental status does not improve or etiology not identifiable. Now on stress-dose steroids.     Elmarie Shiley MD [facility]. Office # [phone] Pager # [phone] 4:17 PM

## 2019-08-29 NOTE — Discharge Summary (Signed)
Physician Discharge Summary  Patient ID: Joanna Ali MRN: [id number] DOB/AGE: 02/02/1951 69 y.o.  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   Significant Diagnostic Studies: CT HEAD WO CONTRAST  Result Date: 08/28/2019 CLINICAL DATA:  Altered mental status (AMS), unclear cause. EXAM: CT HEAD WITHOUT CONTRAST TECHNIQUE: Contiguous axial images were obtained from the base of the skull through the vertex without intravenous contrast. COMPARISON:  Head CT 08/02/2019 FINDINGS: Brain: No evidence of acute intracranial hemorrhage. No demarcated cortical infarction. No evidence of intracranial mass. No midline shift or extra-axial fluid collection. Mild ill-defined hypoattenuation within the cerebral white matter is nonspecific, but consistent with chronic small vessel ischemic disease. A small chronic lacunar infarct is questioned within the left basal ganglia, unchanged from prior exam (series 3, image 18). Mild generalized parenchymal atrophy. Vascular: No hyperdense vessel.  Atherosclerotic calcifications. Skull: Normal. Negative for fracture or focal lesion. Sinuses/Orbits: Visualized orbits demonstrate no acute abnormality. No significant paranasal sinus disease or mastoid effusion at the imaged levels. IMPRESSION: No evidence of acute intracranial abnormality. Stable, mild generalized parenchymal atrophy and chronic small vessel ischemic disease. A small chronic lacunar infarct is questioned within the left basal ganglia, unchanged. Electronically Signed   By: Kellie Simmering DO   On: 08/28/2019 14:37   US Abdomen  Complete  Result Date: 08/28/2019 CLINICAL DATA:  Abdominal pain. EXAM: ABDOMEN ULTRASOUND COMPLETE COMPARISON:  August 05, 2019. FINDINGS: Gallbladder: No gallstones or wall thickening visualized. No sonographic Murphy sign noted by sonographer. Sludge is noted within gallbladder lumen. Common bile duct: Diameter: 4 mm which is within normal limits. Liver: No focal lesion identified. Within normal limits in parenchymal echogenicity. Portal vein is patent on color Doppler imaging with normal direction of blood flow towards the liver. IVC: No abnormality visualized. Pancreas: Visualized portion unremarkable. Spleen: Size and appearance within normal limits. Right Kidney: Length: 9.1 cm. Echogenicity within normal limits. No mass or hydronephrosis visualized. Left Kidney: Length: 8.8 cm. Echogenicity within normal limits. No mass or hydronephrosis visualized. Abdominal aorta: No aneurysm visualized. Other findings: None. IMPRESSION: No significant abnormality seen in the abdomen. Electronically Signed   By: Marijo Conception M.D.   On: 08/28/2019 15:08   DG CHEST PORT 1 VIEW  Result Date: 08/28/2019 CLINICAL DATA:  Post dialysis. Evaluate for fluid overload prior to fluid administration. EXAM: PORTABLE CHEST 1 VIEW COMPARISON:  CT chest 08/08/2019 and chest radiograph 08/06/2019. FINDINGS: Patient is rotated. Trachea is midline. Heart is enlarged. Left IJ dialysis catheter is in the right atrium. Mild diffuse mixed interstitial and airspace opacification. Small left pleural effusion. Trace right pleural fluid. Biapical pleural thickening. IMPRESSION: Congestive heart failure. Electronically Signed   By: Lorin Picket M.D.   On: 08/28/2019 13:45     Labs:  Basic Metabolic Panel: Recent Labs  Lab 08/23/19 0259 08/24/19 1315 08/28/19 0542  NA 133* 130* 125*  K 4.7 4.3 5.0  CL 93* 90* 88*  CO2 24 26 20*  GLUCOSE 122* 100* 79  BUN 72* 119* 81*  CREATININE 2.33* 3.22* 2.31*  CALCIUM 8.8* 8.8* 9.2   PHOS 2.8 3.1 2.5  CBC: Recent Labs  Lab 08/24/19 1315 08/28/19 0037 08/28/19 0534  WBC 3.7* 4.8 6.3  NEUTROABS  --   --  4.7  HGB 10.4* 11.3* 12.0  HCT 36.1 37.5 41.1  MCV 95.5 93.5 95.8  PLT 119* 146* 148*    CBG: Recent Labs  Lab 08/22/19 1932 08/22/19 2344 08/23/19 0324 08/23/19 1433 08/23/19 1648  GLUCAP 143* 106* 125* 128* 125*    Brief HPI:   Joanna Ali is a 69 y.o. female with history of hepatic steatosis, HTN, peripheral neuropathy, macular degeneration, progressive DOE with fluid overload since 02/2019 and history of pericardial/pleural effusions was admitted on 07/03/2019 with AKI, anasarca and fatigue.  She was started on IV diuresis and cardiac echo showed moderate to large pericardial effusion.  She underwent right VATS with pericardial window on 12/23 by Dr. Tera Mater.  She continued to require diuresis with worsening of renal failure with intermittent hyperkalemia and uremia with lethargy requiring initiation of hemodialysis on 12/31.  Patient has had difficulty with swallowing with poor p.o. intake and failure to thrive therefore PEG placed by Dr. Anselm Pancoast on 01/08.  Palliative care consulted for goals of care and recommended trial of steroids empirically due to concerns of carcinoid versus multisystem inflammatory syndrome.  Rheumatology at [facility] was consulted for input and recommended starting high-dose steroids due to consideration of scleroderma with crest syndrome.  She did develop leukopenia with question of malignancy with scleroderma secondary diagnosis.  Dr. Jana Hakim was consulted for input and felt that further screening for malignancy could be done on outpatient basis.  She underwent repeat thoracocentesis on 1/12 that was positive for mesothelial cells--evaluation on outpatient basis.  Hospital course significant for PAF with RVR as well as note of ischemic-looking fingertips felt to be due to scleroderma/small vessel disease.  Beta-blocker was changed to  Cardizem due to worsening of Raynaud's phenomena and she was started on slow steroid taper on 1/26 with recommendations to decrease by 10 mg/week to 30 mg daily and follow-up with rheumatology on outpatient basis.  She continues to have anuria requiring hemodialysis on TTS.  She continued to have poor p.o. intake as well as reports of sacral pain.  Her activity tolerance was improving and CIR was recommended due to functional decline.   Hospital Course: Joanna Ali was admitted to rehab 08/23/2019 for inpatient therapies to consist of PT, ST and OT at least three hours five days a week. Past admission physiatrist, therapy team and rehab RN have worked together to provide customized collaborative inpatient rehab.  Follow-up labs done past admission showed neutropenia had resolved and H&H was stable.  She was found to have stage III sacral decub with evidence of fungal overgrowth and growing and WOC was consulted for input.  Aquacel was ordered for packing of wound and Diflucan 100 mg x 7 days added to help with fungal overgrowth.  Vitamin C and zinc supplements added to help promote wound healing.  Prednisone was decreased to 50 mg on 02/08. Hemodialysis was ongoing on TTS at the end of the day.  Past hemodialysis on 02/06, husband reported patient showing excessive weakness with sleepiness. On 02/08, patient was noted to have current confusion hallucination question became delirium.  CT of head was ordered for work-up and showed no acute intracranial abnormality with chronic lacunar infarct in left basal ganglia.  She was also noted to be hyponatremic and nephrology felt that patient was dehydrated therefore IV fluids added for hydration. Dr. Lorin Mercy with [facility] hospitalist was consulted  for input due to concerns of rehab with increase in activity being detrimental to patient and persistent to transfer patient back to acute.  She recommended trial of IV Solu-Medrol as question decline due to decrease steroids and  recommended monitoring patient for 24 hours with serial lactic acid showing upward trend. Patient continued to be lethargic and showed no improvement with 24 hours of IV solumedrol therefore she was discharged to acute floor for further work up and closer monitoring.     Current medications:  . acetaminophen  500 mg Oral BID  . ascorbic acid  500 mg Per Tube BID  . Chlorhexidine Gluconate Cloth  6 each Topical Q0600  . diclofenac Sodium  2 g Topical BID  . diltiazem  120 mg Oral Daily  . famotidine  10 mg Oral Daily  . feeding supplement (PRO-STAT SUGAR FREE 64)  30 mL Per Tube Daily  . ferric citrate  420 mg Oral TID WC  . fluconazole  100 mg Oral Daily  . gabapentin  100 mg Per Tube Q12H  . Gerhardt's butt cream   Topical TID  . heparin  5,000 Units Subcutaneous Q8H  . hydrocortisone  25 mg Rectal BID  . loratadine  10 mg Oral Daily  . mouth rinse  15 mL Mouth Rinse BID  . multivitamin  1 tablet Oral QHS  . neomycin-bacitracin-polymyxin   Topical BID  . nystatin  5 mL Mouth/Throat QID  . pantoprazole sodium  40 mg Per Tube BID  . sertraline  25 mg Oral BID  . thiamine  100 mg Oral Daily  . zinc sulfate  220 mg Oral Daily    Special instructions:  1. Continue Nephro at 50 cc/hr per PEG. 2. Continue NaCl at 75 cc/hr or per nephrology 3. Strict I/O with daily weights. 4. Air mattress overlay for pressure relief.   Diet: Dysphagia 3, 1500 cc FR.    Disposition: Acute hospital.     Signed: Bary Leriche 09/13/2019, 9:04 AM

## 2019-08-29 NOTE — Progress Notes (Addendum)
CRITICAL VALUE ALERT  Critical Value:  CBG=42, Pt alert but drowsy  Date & Time Notied:  02/092021; 2007  Provider Notified: Dr. Jerilynn Mages. Sharlet Salina - floor coverage MD  Orders Received/Actions taken: Hypoglycemic standing orders  Administered 30m D50 IV  Will CBG in 15 minutes.  Will monitor.

## 2019-08-29 NOTE — Progress Notes (Signed)
Physical Therapy Note  Patient Details  Name: Joanna Ali MRN: [id number] Date of Birth: 1950/12/09 Today's Date: 09/12/2019    Per discussion with Nona Dell, LPN, patient is to discharge from inpatient rehab today due to a change in medical status. Stated that patient is not currently medically appropriate for therapy and will be going off the floor to dialysis soon. Spoke with patient's husband in the room, the patient was resting in bed. Discussed transfer off the unit and answered questions about moving off the floor. He was very appreciative of the care provided by the rehab team, however, very concerned about changing doctors again. He would prefer to have a consistent hospitalist for her, will pass on to medical team in conference today. Patient remained resting in bed intermittently opening her eyes. She did look up when PT spoke to her, however, did not verbalize any response.     Doreene Burke, DPT 09/17/2019, 10:31 AM

## 2019-08-29 NOTE — Progress Notes (Signed)
Patient discharged to [phone], husband at bedside. All belongings sent with patient. No s/s of distress noted at this time. Audie Clear, LPN

## 2019-08-29 NOTE — Patient Care Conference (Signed)
Inpatient RehabilitationTeam Conference and Plan of Care Update Date: 09/16/2019   Time: 11:50 AM   [name]      Medical Record Number: [id number]  Date of Birth: 03-Oct-1950 Sex: Female         Room/Bed: 4M13C/4M13C-01 Payor Info: Payor: MEDICARE / Plan: MEDICARE PART A AND B / Product Type: *No Product type* /    Admit Date/Time:  08/23/2019  7:00 PM  Primary Diagnosis:  CREST syndrome [facility])  Patient Active Problem List   Diagnosis Date Noted  . Heme positive stool 08/28/2019  . 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 Debility   . S/P pericardial window creation   . Thrombocytopenia ([place])   . Anemia of chronic disease   . Scleroderma progressive ([place]) 08/15/2019  . Rheumatoid arthritis ([place]) 08/15/2019  . CREST syndrome ([place]) 08/15/2019  . Atrial fibrillation with RVR ([place])   . AKI (acute kidney injury) ([place])   . Dyspnea   . S/P thoracentesis   . ESRD on hemodialysis ([place]) 07/31/2019  . Dysphagia   . Muscular weakness   . Thrush of mouth and esophagus ([place])   . Failure to thrive in adult   . Goals of care, counseling/discussion   . Palliative care by specialist   . Pressure injury of skin 07/19/2019  . Pleural effusion   . Protein-calorie malnutrition, severe 07/05/2019  . Elevated troponin 07/04/2019  . Atypical pneumonia 07/04/2019  . Hypoxia 07/04/2019  . Pericardial effusion 04/07/2019  . Hypertension 04/07/2019  . Insomnia 04/07/2019  . Monoclonal paraproteinemia   . Anasarca 04/04/2019    Expected Discharge Date: Expected Discharge Date: 08/28/2019(Discharging to acute hospital)  Team Members Present: Physician leading conference: Dr. Courtney Heys Social Worker Present: Lennart Pall, LCSW(Auria Barbra Sarks, LCSW) Nurse Present: Judee Clara, LPN Case Manager: Karene Fry, RN PT Present: Apolinar Junes, PT OT Present: Elisabeth Most, OT SLP Present: Jettie Booze, CF-SLP PPS Coordinator present : Ileana Ladd,  Burna Mortimer, SLP     Current Status/Progress Goal Weekly Team Focus  Bowel/Bladder   anuric; incontinent of bowel  provide bowel incontinence care as soon as possible to prevent contamination of stage 3 coccyx  review bowel pattern to anticipate needs   Swallow/Nutrition/ Hydration             ADL's   dependent at this time due to poor tolerance, pain and limited use of hands (more significant than on eval)  provided education for husband to assist with care and comfort  family education, pain relief, basic adl   Mobility   Mod-total A for bed mobility and transfers, fluctuates based on lethargy and activity tolerance  Min-mod A overall  Patient to return to acute care on 2/9 due to change in medical status.   Communication             Safety/Cognition/ Behavioral Observations            Pain   pt has s/s of considerable pain with movement  provide interventions to relieve pain   assess pain level trends    Skin   stage 2 nose d/t medical equipment; stage 3 coccyx p.u.  maintain stage status of wounds  treat wounds to prevent further breakdown    Rehab Goals Patient on target to meet rehab goals: Yes *See Care Plan and progress notes for long and short-term goals.     Barriers to Discharge  Current Status/Progress Possible Resolutions Date Resolved   Nursing  PT  Medical stability;Incontinence;Wound Care;Weight;Nutrition means                 OT                  SLP                SW                Discharge Planning/Teaching Needs:  D/c to home  family education per therapy recommendations   Team Discussion: MD - transferring to acute hospital d/t lactic acid 3.9, much less responsive, not participating.  RN anuric, inc bowel, stage 3 on coccyx, stage 2 on nose.  OT tot +2, poor tolerance.  PT mod to tot A bed and transfers, decreased activity tolerance, lethargic.   Revisions to Treatment Plan: N/A     Medical Summary Current Status:  incontnent of B/B; on TFs and not eating although allowed to; extrememly lethargic- cannot participated in therapies last few days Weekly Focus/Goal: to improve cognition- wasn't able to pariticpated in all therapies.  Barriers to Discharge: Home enviroment access/layout;Hemodialysis;Medical stability;Wound care;Weight;Behavior   Possible Resolutions to Barriers: being transferred to acute hospital due to lactic acid of 3.9 and almost completely nonresponsive- cocnering behavior   Continued Need for Acute Rehabilitation Level of Care: The patient requires daily medical management by a physician with specialized training in physical medicine and rehabilitation for the following reasons: Direction of a multidisciplinary physical rehabilitation program to maximize functional independence : Yes Medical management of patient stability for increased activity during participation in an intensive rehabilitation regime.: Yes Analysis of laboratory values and/or radiology reports with any subsequent need for medication adjustment and/or medical intervention. : Yes   I attest that I was present, lead the team conference, and concur with the assessment and plan of the team.   Retta Diones 08/26/2019, 8:21 PM   Team conference was held via web/ teleconference due to [place] - 19

## 2019-08-29 NOTE — Progress Notes (Signed)
Social Work Discharge Note   The overall goal for the admission was met for:   Discharge location: Acute hospital  Length of Stay: 6  Discharge activity level: N/A  Home/community participation: N/A  Services provided included: N/A- pt d/c from unit before discussion on care needs  Financial Services: N/A  Follow-up services arranged: N/A  Comments (or additional information): N/A  Patient/Family verbalized understanding of follow-up arrangements: N/A  Individual responsible for coordination of the follow-up plan: N/A  Confirmed correct DME delivered: Rana Snare 08/24/2019    Loralee Pacas, MSW, [place] Office: [phone] Cell: [phone] Fax: [phone]

## 2019-08-29 NOTE — Progress Notes (Signed)
Show:Clear all [x] Manual[] Template[x] Copied  Added by: [x] Curt Bears, RN  [] Hover for details Pt checked qh for safety. Bed low with fall alarm on and call bell within reach. NS infusing into peripheral access right wrist without difficulty. Tube feed running at 50cc/hr into peg tube without difficulty.

## 2019-08-29 NOTE — Progress Notes (Signed)
Pt's husband at bedside, wanted nurse to make note that pt has recently had difficulty opening mouth wide and has had some slight tremors.

## 2019-08-29 NOTE — Progress Notes (Signed)
Lactic acid level cv 2.1, 2.2, 3.9; consistent with previously reported and called value.

## 2019-08-30 ENCOUNTER — Inpatient Hospital Stay (HOSPITAL_COMMUNITY): Payer: Medicare Other

## 2019-08-30 DIAGNOSIS — L8994 Pressure ulcer of unspecified site, stage 4: Secondary | ICD-10-CM

## 2019-08-30 DIAGNOSIS — M340 Progressive systemic sclerosis: Secondary | ICD-10-CM

## 2019-08-30 DIAGNOSIS — E43 Unspecified severe protein-calorie malnutrition: Secondary | ICD-10-CM

## 2019-08-30 DIAGNOSIS — I313 Pericardial effusion (noninflammatory): Secondary | ICD-10-CM

## 2019-08-30 DIAGNOSIS — R1310 Dysphagia, unspecified: Secondary | ICD-10-CM

## 2019-08-30 DIAGNOSIS — Z931 Gastrostomy status: Secondary | ICD-10-CM

## 2019-08-30 DIAGNOSIS — G9340 Encephalopathy, unspecified: Secondary | ICD-10-CM

## 2019-08-30 DIAGNOSIS — D638 Anemia in other chronic diseases classified elsewhere: Secondary | ICD-10-CM

## 2019-08-30 DIAGNOSIS — E872 Acidosis: Secondary | ICD-10-CM

## 2019-08-30 DIAGNOSIS — M341 CR(E)ST syndrome: Secondary | ICD-10-CM

## 2019-08-30 DIAGNOSIS — N186 End stage renal disease: Secondary | ICD-10-CM

## 2019-08-30 DIAGNOSIS — R627 Adult failure to thrive: Secondary | ICD-10-CM

## 2019-08-30 DIAGNOSIS — Z87891 Personal history of nicotine dependence: Secondary | ICD-10-CM

## 2019-08-30 DIAGNOSIS — I96 Gangrene, not elsewhere classified: Secondary | ICD-10-CM

## 2019-08-30 DIAGNOSIS — Z992 Dependence on renal dialysis: Secondary | ICD-10-CM

## 2019-08-30 LAB — RENAL FUNCTION PANEL
Albumin: 2.7 g/dL — ABNORMAL LOW (ref 3.5–5.0)
Anion gap: 16 — ABNORMAL HIGH (ref 5–15)
BUN: 51 mg/dL — ABNORMAL HIGH (ref 8–23)
CO2: 23 mmol/L (ref 22–32)
Calcium: 8.5 mg/dL — ABNORMAL LOW (ref 8.9–10.3)
Chloride: 91 mmol/L — ABNORMAL LOW (ref 98–111)
Creatinine, Ser: 1.86 mg/dL — ABNORMAL HIGH (ref 0.44–1.00)
GFR calc Af Amer: 31 mL/min — ABNORMAL LOW (ref >60)
GFR calc non Af Amer: 27 mL/min — ABNORMAL LOW (ref >60)
Glucose, Bld: 106 mg/dL — ABNORMAL HIGH (ref 70–99)
Phosphorus: 2.7 mg/dL (ref 2.5–4.6)
Potassium: 4.1 mmol/L (ref 3.5–5.1)
Sodium: 130 mmol/L — ABNORMAL LOW (ref 135–145)

## 2019-08-30 LAB — CBC
HCT: 37.1 % (ref 36.0–46.0)
Hemoglobin: 10.9 g/dL — ABNORMAL LOW (ref 12.0–15.0)
MCH: 28.2 pg (ref 26.0–34.0)
MCHC: 29.4 g/dL — ABNORMAL LOW (ref 30.0–36.0)
MCV: 96.1 fL (ref 80.0–100.0)
Platelets: 196 10*3/uL (ref 150–400)
RBC: 3.86 MIL/uL — ABNORMAL LOW (ref 3.87–5.11)
RDW: 22.1 % — ABNORMAL HIGH (ref 11.5–15.5)
WBC: 5.5 10*3/uL (ref 4.0–10.5)
nRBC: 0 % (ref 0.0–0.2)

## 2019-08-30 LAB — GLUCOSE, CAPILLARY
Glucose-Capillary: 139 mg/dL — ABNORMAL HIGH (ref 70–99)
Glucose-Capillary: 41 mg/dL — CL (ref 70–99)
Glucose-Capillary: 54 mg/dL — ABNORMAL LOW (ref 70–99)
Glucose-Capillary: 60 mg/dL — ABNORMAL LOW (ref 70–99)
Glucose-Capillary: 66 mg/dL — ABNORMAL LOW (ref 70–99)
Glucose-Capillary: 68 mg/dL — ABNORMAL LOW (ref 70–99)
Glucose-Capillary: 77 mg/dL (ref 70–99)
Glucose-Capillary: 80 mg/dL (ref 70–99)
Glucose-Capillary: 83 mg/dL (ref 70–99)
Glucose-Capillary: 93 mg/dL (ref 70–99)

## 2019-08-30 LAB — GLUCOSE, RANDOM: Glucose, Bld: 101 mg/dL — ABNORMAL HIGH (ref 70–99)

## 2019-08-30 MED ORDER — PANTOPRAZOLE SODIUM 40 MG PO PACK
40.0000 mg | PACK | Freq: Two times a day (BID) | ORAL | Status: DC
  Administered 2019-08-30: 22:00:00 40 mg
  Filled 2019-08-30 (×2): qty 20

## 2019-08-30 MED ORDER — ACETAMINOPHEN 160 MG/5ML PO SOLN
650.0000 mg | Freq: Four times a day (QID) | ORAL | Status: DC | PRN
  Administered 2019-08-31: 06:00:00 650 mg via ORAL
  Filled 2019-08-30: qty 20.3

## 2019-08-30 MED ORDER — ONDANSETRON HCL 4 MG/2ML IJ SOLN
INTRAMUSCULAR | Status: AC
  Administered 2019-08-30: 4 mg
  Filled 2019-08-30: qty 2

## 2019-08-30 MED ORDER — DEXTROSE 50 % IV SOLN
INTRAVENOUS | Status: AC
  Filled 2019-08-30: qty 50

## 2019-08-30 MED ORDER — VALACYCLOVIR 50 MG/ML ORAL SUSPENSION: 500.0000 mg | Freq: Two times a day (BID) | ORAL | Status: DC

## 2019-08-30 MED ORDER — DEXTROSE 50 % IV SOLN
25.0000 g | INTRAVENOUS | Status: AC
  Administered 2019-08-30: 04:00:00 25 g via INTRAVENOUS

## 2019-08-30 MED ORDER — ONDANSETRON HCL 4 MG/2ML IJ SOLN
4.0000 mg | Freq: Four times a day (QID) | INTRAMUSCULAR | Status: DC | PRN
  Administered 2019-08-30: 4 mg via INTRAVENOUS
  Filled 2019-08-30: qty 2

## 2019-08-30 MED ORDER — CHLORHEXIDINE GLUCONATE CLOTH 2 % EX PADS
6.0000 | MEDICATED_PAD | Freq: Every day | CUTANEOUS | Status: DC
  Administered 2019-08-31 – 2019-09-01 (×2): 6 via TOPICAL

## 2019-08-30 MED ORDER — DEXTROSE 50 % IV SOLN
INTRAVENOUS | Status: AC
  Administered 2019-08-30: 17:00:00 25 mL
  Filled 2019-08-30: qty 50

## 2019-08-30 MED ORDER — VALACYCLOVIR 50 MG/ML ORAL SUSPENSION
500.0000 mg | Freq: Two times a day (BID) | ORAL | Status: DC
  Filled 2019-08-30: qty 10

## 2019-08-30 MED ORDER — GLUCOSE 40 % PO GEL
ORAL | Status: AC
  Filled 2019-08-30: qty 1

## 2019-08-30 MED ORDER — DEXTROSE 5 % IV SOLN
5.0000 mg/kg | INTRAVENOUS | Status: DC
  Administered 2019-08-30 – 2019-08-31 (×2): 215 mg via INTRAVENOUS
  Filled 2019-08-30 (×3): qty 4.3

## 2019-08-30 MED ORDER — NEPRO/CARBSTEADY PO LIQD
1000.0000 mL | ORAL | Status: DC
  Administered 2019-08-30 – 2019-08-31 (×3): 1000 mL

## 2019-08-30 MED ORDER — LORATADINE 10 MG PO TABS
10.0000 mg | ORAL_TABLET | Freq: Every day | ORAL | Status: DC
  Administered 2019-08-30 – 2019-08-31 (×2): 10 mg
  Filled 2019-08-30 (×2): qty 1

## 2019-08-30 MED ORDER — METOPROLOL TARTRATE 5 MG/5ML IV SOLN
2.5000 mg | INTRAVENOUS | Status: AC | PRN
  Administered 2019-08-31 (×2): 2.5 mg via INTRAVENOUS
  Filled 2019-08-30 (×2): qty 5

## 2019-08-30 NOTE — Consult Note (Signed)
[facility] Nurse Consult Note: Patient receiving care in [facility].  Spouse in room at the time of my assessment. 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  Change Aquacel daily; the foam can be changed every 3 days.  There are no wounds on the heels. The patient's spouse states she doesn't like to wear the Prevalon boots because they make her feet hot.  I explained the purpose of the boots and requested that she wear them at least part of the day. Monitor the wound area(s) for worsening of condition such as: Signs/symptoms of infection,  Increase in size,  Development of or worsening of odor, Development of pain, or increased pain at the affected locations.  Notify the medical team if any of these develop.  Thank you for the consult.  Discussed plan of care with the patient and bedside nurse.  [facility] nurse will not follow at this time.  Please re-consult the [facility] team if needed.  Val Riles, RN, MSN, CWOCN, CNS-BC, pager [phone]

## 2019-08-30 NOTE — Consult Note (Addendum)
Date of Admission:  09/11/2019          Reason for Consult: Encephalopathy   Referring Provider: Dr. Algis Liming   Assessment:  1. Encephalopathy 2. Progressive scleroderma/crest 3. Recent abdominal pain with lactic acidosis 4. Pericardial effusion 5. Necrotic appearing nasal lesions 6. End-stage renal disease on hemodialysis 7. Dysphagia with feeding tube 8. Stage IV decubitus ulcer  Plan:  1. Would start IV acyclovir in case she has HSV encephalitis 2. Will get CT maxillofacial and CT abdomen 3. If she were to improve on acyclovir would get an MRI of the brain to look for temporal lobe enhancement 4. Would be an telephonic communication at minimum with rheumatology at a tertiary care center such as [facility] who were consulted during patient's admission 5. Consider further work-up for malignancy per rheumatology at [facility] 6. 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 Chlorhexidine Gluconate Cloth  6 each Topical Daily  . dextrose      . loratadine  10 mg Per Tube Daily  . methylPREDNISolone (SOLU-MEDROL) injection  125 mg Intravenous Q8H  . multivitamin  1 tablet Oral QHS  . pantoprazole sodium  40 mg Per Tube BID  . thiamine  100 mg Oral Daily   Continuous Infusions: . feeding supplement (NEPRO CARB STEADY) 1,000 mL (08/30/19 1052)   PRN Meds:.acetaminophen, ALPRAZolam, antiseptic oral rinse, bisacodyl, camphor-menthol, ondansetron (ZOFRAN) IV, oxyCODONE  HPI: Joanna Ali is a 69 y.o. female who has been recently diagnosed with crest and progressive scleroderma.  She is encephalopathic and unable to provide any  history herself history is obtained through talking to her husband who is at the bedside and through looking through the medical records and communication with her providers.  Apparently Mrs. Mcgaughey has had progressive decline that began in the summer when she was having work-up for lower extremity edema having been seen by [facility] rheumatology cardiology here in [facility] then rheumatology at [facility] without finding a diagnosis.  She ultimately was hospitalized on December 14 and during that protracted hospitalization was ultimately diagnosed with progressive scleroderma.  Her hospital stay was complicated by development of a pericardial effusion status post right-sided VATS with pericardial window in July 12, 2019, worsening renal function requiring ultimately hemodialysis.  She is been unable to eat due to her scleroderma and ultimately had a PEG tube placed.  Also had paroxysmal atrial fibrillation Retterath ventricular response.  She has had runny nose and been evaluated vascular surgery.  Ultimately she had had improvement with corticosteroids and was discharged to the rehab team.  However while in the inpatient rehab service, he then had a rapid decline in her cognition with progressive exhaustion.  Apparently also complained of abdominal pain which is evaluate ultrasound.  This occurred as her corticosteroids are being tapered and also had some correlation according to her husband with her having hemodialysis.  She was then readmitted to the hospitalist service.  She is being followed closely by nephrology and undergoing hemodialysis through an HD catheter.  Her cortico-steroids have been increased and apparently her cognition is improved in the last few days.  She does have a necrotic appearing lesion in her bilateral nares in particular.  Husband states this was a site of prior inflammation when she had an NG tube but showed me a photograph of her nose when it had nearly resolved.  She  has had a stage III which is now developed into a stage IV decubitus ulcer with exposed bone in her sacrum.  On exam today she is lethargic and not able to answer any of my questions.  She appears fairly wasted muscles and and has cool extremities with Raynaud's phenomena.      Necrotic area under nose visualized below see picture of August 30, 2019:       We will start IV acyclovir in case she is suffering from herpes encephalopathy though I suspect her encephalopathy is more likely multifactorial.  If she were to improve on acyclovir I would get an MRI of the brain to look for enhancement of the medial temporal lobe.  We will order CT of the abdomen given her abdominal pain and lactic acidosis  I would not start antibacterial antibiotics.  Would continue corticosteroids but would consult also rheumatology from tertiary care center the been previously consulted at [facility].  We could get an MRI of the pelvis to elucidate whether or not she has radiographic evidence of osteomyelitis given her exposed bone in the sacrum.  I am very skeptical though that her overall trajectory is getting worse because of infectious disease.  I have fear that her progressive scleroderma is not going to improve and that the idea of her truly returning to more functional life are not realistic though I have not been involved in her care until now. My own limited experience in dealing with this condition has not been one in which I have seen good outcomes. Ultimately guidance from Rheumatology re "big picture" and prognosis is imperative.   Review of Systems: Review of Systems  Unable to perform ROS: Mental status change    Past Medical History:  Diagnosis Date  . Atrial fibrillation with RVR ([place])   . Diffuse scleroderma ([place])   . ESRD on hemodialysis ([place])   . Hypertension   . Pericardial effusion 06/2019    Social History   Tobacco Use  . Smoking status: Former Smoker     Packs/day: 0.50    Years: 10.00    Pack years: 5.00    Types: Cigarettes    Quit date: 03/20/1979    Years since quitting: 40.4  . Smokeless tobacco: Never Used  . Tobacco comment: pt states she smoked from her early 41s to early 35s.  Substance Use Topics  . Alcohol use: No  . Drug use: No    Family History  Problem Relation Age of Onset  . Hypertension Mother    No Known Allergies  OBJECTIVE: Blood pressure (!) 163/97, pulse (!) 105, temperature 97.7 F (36.5 C), temperature source Oral, resp. rate (!) 22, weight 43 kg, SpO2 95 %.  Physical Exam  Lab Results Lab Results  Component Value Date   WBC 5.5 08/30/2019   HGB 10.9 (L) 08/30/2019   HCT 37.1 08/30/2019   MCV 96.1 08/30/2019   PLT 196 08/30/2019    Lab Results  Component Value Date   CREATININE 1.86 (H) 08/30/2019   BUN 51 (H) 08/30/2019   NA 130 (L) 08/30/2019   K 4.1 08/30/2019   CL 91 (L) 08/30/2019   CO2 23 08/30/2019    Lab Results  Component Value  Date   ALT 24 08/24/2019   AST 32 09/12/2019   ALKPHOS 99 08/30/2019   BILITOT 0.9 08/28/2019     Microbiology: Recent Results (from the past 240 hour(s))  MRSA PCR Screening     Status: None   Collection Time: 08/21/19  9:45 AM   Specimen: Nasal Mucosa; Nasopharyngeal  Result Value Ref Range Status   MRSA by PCR NEGATIVE NEGATIVE Final    Comment:        The GeneXpert MRSA Assay (FDA approved for NASAL specimens only), is one component of a comprehensive MRSA colonization surveillance program. It is not intended to diagnose MRSA infection nor to guide or monitor treatment for MRSA infections. Performed at [facility], [place] [address] [place], [place] [address]   Culture, blood (routine x 2)     Status: None (Preliminary result)   Collection Time: 09/07/2019  1:33 PM   Specimen: BLOOD RIGHT HAND  Result Value Ref Range Status   Specimen Description BLOOD RIGHT HAND  Final   Special Requests   Final    BOTTLES DRAWN AEROBIC ONLY  Blood Culture adequate volume   Culture   Final    NO GROWTH < 24 HOURS Performed at [facility], [place] [address] [place], [place] [address]    Report Status PENDING  Incomplete  Culture, blood (routine x 2)     Status: None (Preliminary result)   Collection Time: 08/26/2019  1:45 PM   Specimen: BLOOD  Result Value Ref Range Status   Specimen Description BLOOD RIGHT ANTECUBITAL  Final   Special Requests   Final    BOTTLES DRAWN AEROBIC ONLY Blood Culture results may not be optimal due to an inadequate volume of blood received in culture bottles   Culture   Final    NO GROWTH < 24 HOURS Performed at [facility], [place] [address] [place], [place] [address]    Report Status PENDING  Incomplete    Alcide Evener, [place] [facility] [phone] pager  08/30/2019, 3:50 PM

## 2019-08-30 NOTE — Progress Notes (Signed)
Pharmacy Antibiotic Note  Joanna Ali is a 69 y.o. female admitted on 09/16/2019 with AMS.  Pharmacy has been consulted for Acyclovir dosing.  CC/HPI: 07/03/19-08/23/19 and then discharged to [facility].. Transferred from [place] with AMS. (see Significant Events below)  PMH: Scleroderma, RA, ESRD, CREST syndrome, Afib, HTN  Significant events: Newly diagnosed scleroderma+rheumatoid arthritis complicated by rapid progression to renal failure now requiring hemodialysis, CREST syndrome, weight loss and functional status decline. Steroids were tapered down to prednisone 34m/d  ID: r/o Herpes encephalitis. Immunosuppressed on prednisone - Has sacral wound with exposed bone. Eval for osteo. - Steroids changed to Solumedrol 125/8hr  Acyclovir 2/10>>  Plan: Eval for osteo Acyclovir 562mkg IV q 24h AFTER HD session.= [id number]    Weight: 94 lb 12.8 oz (43 kg)  Temp (24hrs), Avg:97.6 F (36.4 C), Min:97.4 F (36.3 C), Max:97.8 F (36.6 C)  Recent Labs  Lab 08/24/19 1315 08/28/19 0037 08/28/19 0534 08/28/19 0542 08/28/19 1250 08/28/19 1600 08/28/19 1850 09/06/2019 1333 08/30/19 0144  WBC 3.7* 4.8 6.3  --   --   --   --  6.2 5.5  CREATININE 3.22*  --   --  2.31*  --   --   --  3.36* 1.86*  LATICACIDVEN  --   --   --   --  2.1* 2.2* 3.9* 1.7  --     Estimated Creatinine Clearance: 19.1 mL/min (A) (by C-G formula based on SCr of 1.86 mg/dL (H)).    No Known Allergies  Joanna Ali S. RobAlford HighlandharmD, BCPS Clinical Staff Pharmacist [email or web address]  RobWayland Salinas10/2021 3:53 PM

## 2019-08-30 NOTE — Progress Notes (Signed)
Pt's RN caught VAST RN in hallway and stated PIV which was placed recently was beeping occluded intermittently. VAST RN to assess line after D50 administered through it. PIV is positional, but flushed easily, especially with arm rotated slightly outward away from body. Educated caregiver and pt's RN regarding black pressure line on IV pump and how to reposition as needed during administration of Acyclovir that is running at high rate for 22g angiocath (182ms/hr). Advised if pump continues to beep and site continues to look good without redness or swelling, a slower rate might be necessary. Further advised if decreasing rate is unsuccessful, a new PIV will need to be placed. Husband and pt's nurse both verbalized understanding.

## 2019-08-30 NOTE — Progress Notes (Signed)
Patient ID: Joanna Ali, female   DOB: 1950-10-11, 69 y.o.   MRN: [id number]  Late entry for patient seen in 4M13 at 0855AM prior to her transfer to [facility] Progress Note   Assessment/ Plan:   1.  Altered mental status/encephalopathy: She has remained encephalopathic overnight with minimal verbal responses.  Without evidence of hepatic encephalopathy or intracranial pathology and CT scan.  Mental status appears to be improving with higher dose Solu-Medrol. 2. ESRD: With acute kidney injury that has been dialysis dependent now for the past 6 weeks or so.  Permanent dialysis access placement limited by her Raynaud's and evidence of vascular insufficiency with continued hemodialysis via TDC.  Continue hemodialysis on a TTS schedule-we will continue to discuss expectations/prognosis with her husband. 3. Anemia: Without overt blood loss, rising hemoglobin/hematocrit appears to be reflective of hemoconcentration. 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 5. Nutrition: With low albumin level and likely poor intake secondary to encephalopathy. 6. Hypertension: Blood pressure within acceptable range  Subjective:   She is more communicative and alert this morning when seen (this is the best from a mental status standpoint that I have seen her in the last 3 days).   Objective:   BP (!) 154/99 (BP Location: Left Arm)   Pulse (!) 105   Temp (!) 97.4 F (36.3 C) (Axillary)   Resp (!) 21   Wt 43 kg   SpO2 95%   BMI 15.30 kg/m   Physical Exam: Gen: Resting comfortably on her side, awakens and engages in conversation-feeble voice CVS: Pulse regular rhythm, normal rate, S1 and S2 normal Resp: Clear to auscultation, no rales/rhonchi Abd: Soft, flat, nontender Ext: 1+ bilateral lower extremity edema, right IJ TDC.  Labs: BMET Recent Labs  Lab 08/24/19 1315 08/28/19 0542 09/07/2019 1333 08/30/19 0144  NA 130* 125* 125* 130*  K  4.3 5.0 5.6* 4.1  CL 90* 88* 88* 91*  CO2 26 20* 19* 23  GLUCOSE 100* 79 112* 106*  BUN 119* 81* 140* 51*  CREATININE 3.22* 2.31* 3.36* 1.86*  CALCIUM 8.8* 9.2 8.9 8.5*  PHOS 3.1 2.5  --  2.7   CBC Recent Labs  Lab 08/28/19 0037 08/28/19 0534 08/30/2019 1333 08/30/19 0144  WBC 4.8 6.3 6.2 5.5  NEUTROABS  --  4.7  --   --   HGB 11.3* 12.0 10.1* 10.9*  HCT 37.5 41.1 33.6* 37.1  MCV 93.5 95.8 93.6 96.1  PLT 146* 148* 216 196      Medications:    . Chlorhexidine Gluconate Cloth  6 each Topical Daily  . dextrose      . feeding supplement (NEPRO CARB STEADY)  1,000 mL Per Tube Q24H  . methylPREDNISolone (SOLU-MEDROL) injection  125 mg Intravenous Q8H  . multivitamin  1 tablet Oral QHS  . thiamine  100 mg Oral Daily   Elmarie Shiley, MD 08/30/2019, 8:43 AM

## 2019-08-30 NOTE — Progress Notes (Signed)
Recheck CBG=139, administered PRN pain medication Tylenol 500 mg solution per order for pain at 4/10 on faces pain scale.  Will monitor and endorse to day shift RN appropriately.

## 2019-08-30 NOTE — Progress Notes (Addendum)
PROGRESS NOTE   Joanna Ali  DXI:[id number]    DOB: 1950/09/27    DOA: 09/09/2019  PCP: Renaldo Reel, DO   I have briefly reviewed patients previous medical records in [facility].  Chief Complaint:  Decline in functional and mental status with increasing drowsiness.   Brief Narrative:  69 year old female with PMH of HTN, chronic pericardial effusion, moderate aortic valve regurgitation, former smoker, alcohol abuse, had extended hospital stay 07/03/2019-08/23/2019.  Initially admitted for acute kidney injury, anasarca and fatigue, started on IV diuresis, echo showed moderate to large pericardial effusion.  S/p right VATS with pericardial window on 12/23. Progressive renal failure with intermittent hyperkalemia and uremia, hemodialysis initiated 12/31.  Dysphagia, poor oral intake and ongoing failure to thrive therefore PEG placed by IR on 1/8.  PMT consulted, suspected autoimmune process, rheumatology consulted for input and recommended starting high-dose steroids due to consideration for of scleroderma with crest syndrome.  Oncology was consulted due to question of malignancy given scleroderma diagnosis and they recommended outpatient evaluation.  Course complicated by PAF with RVR and ischemic-looking fingertips due to scleroderma/small vessel disease.  Beta-blockers changed to Cardizem due to worsening of Raynaud's phenomenon and she was started on slow steroid taper on 1/26.  Anuric requiring TTS HD.  Prednisone reduced to 50 mg daily on 2/8.  On 2/8 patient noted to be confused, hallucinating and delirious.  CT head without acute findings.  TRH was consulted, IV Solu-Medrol initiated without significant improvement in her lethargy and hence hospitalized.   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  Blood cultures x2: Negative to date.  MRSA PCR negative.  Chest x-ray 2/8 suggestive of CHF.  CT head 2/8: No acute intracranial abnormality.  Abdominal ultrasound 2/8: No significant abnormality.  As discussed with Dr. Hilma Favors, PMT, she has clearly declined compared to couple of weeks ago.  Family reported this decline about 3 days PTA.  She was concerned regarding infectious etiology i.e. sacral wound and HSV-1 infection.  Without having seen her before, her mental status seems somewhat improved as an alert, seems oriented at least x2 and follows simple instructions.?  Hyponatremia as cause of her mental status changes.  Also intermittently hypoglycemic.  Scleroderma/rheumatoid/crest flare: Steroids were being gradually weaned down recently.  It is possible that her flare is due to taper of her steroid was too quick or another precipitating cause such as infection.  Continue IV Solu-Medrol.  As discussed with Dr. Hilma Favors, PMT, hopeful for transfer to work tertiary center with robust rheumatology availability.  She was going to try for such a transfer.  Stage IV sacral decubitus ulcer: May not demonstrate infectious signs due to immunocompromise status from autoimmune disease and steroids.  After lengthy discussion with Dr. Hilma Favors, consulted ID, input appreciated.  Getting CT maxillofacial and abdomen.  No antibiotics initiated by ID at this time.  ID did start patient on IV acyclovir due to lesions on nose and in case she has HSV encephalitis.  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  IV acyclovir per ID.  ESRD: Nephrology following for dialysis needs.  Hyponatremia: Unclear etiology.?  Volume depletion.  Has improved from mid 120s to 130.  Could this have been the cause of her mental status change.  Hyperkalemia: Resolved.  Anemia:  Stable.  Essential hypertension: Mildly uncontrolled.  Dysphagia/s/p PEG tube: Continue.  Intermittent hypoglycemia: However does not seem to be having mental status changes at this time.?  CBGs accuracy given Raynaud's.  If persists then may have to draw a blood glucose level at the same time CBGs abnormally low.  Body mass index is 15.3 kg/m.   Adult failure to thrive: Multifactorial as noted above.  As discussed with Dr. Hilma Favors, PMT today, need to continue aggressive evaluation and management at this time.  Severe protein calorie malnutrition  Nutritional Status Nutrition Problem: Severe Malnutrition Etiology: chronic illness(ESRD on HD, scleroderma) Signs/Symptoms: moderate muscle depletion, severe muscle depletion, moderate fat depletion, severe fat depletion Interventions: MVI, Tube feeding  DVT prophylaxis: SCD Code Status: Full Family Communication: None at bedside. Disposition:  . Patient came from: CIR           . Anticipated d/c place: To be determined . Barriers to d/c: Pending clinical improvement   Consultants:   Nephrology PMT Infectious disease  Procedures:   HD  Antimicrobials:   IV acyclovir   Subjective:  Patient seen this morning.  Awake, alert, oriented to self and place, answered and soft tone.  Follows simple instructions.  "I am good".  Specifically denied pain or complaints.  As per nephrology follow-up, this was the best from a mental status standpoint that they have seen in the last 3 days.  However per PMT, she looks worse than before.  Objective:   Vitals:   08/30/19 0417 08/30/19 0500 08/30/19 0823 08/30/19 1214  BP: (!) 139/98  (!) 154/99 (!) 163/97  Pulse: (!) 101  (!) 105 (!) 105  Resp: 14  (!) 21 (!) 22  Temp: (!) 97.4 F (36.3 C)  (!) 97.4 F (36.3 C) 97.7 F (36.5 C)  TempSrc: Axillary  Axillary Oral  SpO2: 95%  95% 95%  Weight:  43 kg      General exam: Elderly female, small built, frail and chronically ill looking  lying comfortably in bed.  Did not appear in any distress. Respiratory system: Clear to auscultation/poor inspiratory effort. Respiratory effort normal. Cardiovascular system: S1 & S2 heard, RRR. No JVD, murmurs, rubs, gallops or clicks. No pedal edema.  Telemetry personally reviewed: ST in the 100s. Gastrointestinal system: Abdomen is nondistended, soft and nontender. No organomegaly or masses felt. Normal bowel sounds heard.  PEG tube site intact without acute findings. Central nervous system: Alert and oriented x2. No focal neurological deficits. Extremities: Moving all limbs symmetrically and follows simple instructions.  Dusky discoloration of the tip of her fingers. Skin: Generally taut and shiny skin, especially over her nose and her fingers.  Blackish discolored area under the tip of her nose/philtrum.  I did not see any vesicles.  Sacral wound dressing clean and dry-recently examined by wound care RN and their input appreciated, does not appear to be overtly infected. Psychiatry: Judgement and insight appear impaired. Mood & affect appropriate.     Data Reviewed:   I have personally reviewed following labs and imaging studies   CBC: Recent Labs  Lab 08/28/19 0534 08/21/2019 1333 08/30/19 0144  WBC 6.3 6.2 5.5  NEUTROABS 4.7  --   --   HGB 12.0 10.1* 10.9*  HCT 41.1 33.6* 37.1  MCV 95.8 93.6 96.1  PLT 148* 216 564    Basic Metabolic Panel:  Recent Labs  Lab 08/24/19 1315 08/24/19 1315 08/28/19 0542 08/26/2019 1333 08/30/19 0144  NA 130*   < > 125* 125* 130*  K 4.3   < > 5.0 5.6* 4.1  CL 90*   < > 88* 88* 91*  CO2 26   < > 20* 19* 23  GLUCOSE 100*   < > 79 112* 106*  BUN 119*   < > 81* 140* 51*  CREATININE 3.22*   < > 2.31* 3.36* 1.86*  CALCIUM 8.8*   < > 9.2 8.9 8.5*  PHOS 3.1  --  2.5  --  2.7   < > = values in this interval not displayed.    Liver Function Tests: Recent Labs  Lab 08/28/19 0534 08/28/19 0534 08/28/19 0542 09/16/2019 1333 08/30/19 0144  AST  52*  --   --  32  --   ALT 25  --   --  24  --   ALKPHOS 121  --   --  99  --   BILITOT 0.7  --   --  0.9  --   PROT 5.7*  --   --  5.5*  --   ALBUMIN 2.9*   < > 2.8* 2.7* 2.7*   < > = values in this interval not displayed.    CBG: Recent Labs  Lab 08/30/19 1157 08/30/19 1653 08/30/19 1735  GLUCAP 80 54* 68*    Microbiology Studies:   Recent Results (from the past 240 hour(s))  MRSA PCR Screening     Status: None   Collection Time: 08/21/19  9:45 AM   Specimen: Nasal Mucosa; Nasopharyngeal  Result Value Ref Range Status   MRSA by PCR NEGATIVE NEGATIVE Final    Comment:        The GeneXpert MRSA Assay (FDA approved for NASAL specimens only), is one component of a comprehensive MRSA colonization surveillance program. It is not intended to diagnose MRSA infection nor to guide or monitor treatment for MRSA infections. Performed at [facility], [place] [address] [place], [place] [address]   Culture, blood (routine x 2)     Status: None (Preliminary result)   Collection Time: 09/06/2019  1:33 PM   Specimen: BLOOD RIGHT HAND  Result Value Ref Range Status   Specimen Description BLOOD RIGHT HAND  Final   Special Requests   Final    BOTTLES DRAWN AEROBIC ONLY Blood Culture adequate volume   Culture   Final    NO GROWTH < 24 HOURS Performed at [facility], [place] [address] [place], [place] [address]    Report Status PENDING  Incomplete  Culture, blood (routine x 2)     Status: None (Preliminary result)   Collection Time: 09/06/2019  1:45 PM   Specimen: BLOOD  Result Value Ref Range Status   Specimen Description BLOOD RIGHT ANTECUBITAL  Final   Special Requests   Final    BOTTLES DRAWN AEROBIC ONLY Blood Culture results may not be optimal due to an inadequate volume of blood received in culture bottles   Culture   Final    NO GROWTH < 24 HOURS Performed at [facility], [place] [address], [place], [place] [address]    Report Status PENDING  Incomplete      Radiology Studies:  No results found.   Scheduled Meds:   . Chlorhexidine Gluconate Cloth  6 each Topical Daily  . dextrose      . loratadine  10 mg Per  Tube Daily  . methylPREDNISolone (SOLU-MEDROL) injection  125 mg Intravenous Q8H  . multivitamin  1 tablet Oral QHS  . pantoprazole sodium  40 mg Per Tube BID  . thiamine  100 mg Oral Daily    Continuous Infusions:   . acyclovir 215 mg (08/30/19 1822)  . feeding supplement (NEPRO CARB STEADY) 1,000 mL (08/30/19 1052)     LOS: 1 day     Vernell Leep, MD, [place], [facility]. [facility]    To contact the attending provider between 7A-7P or the covering provider during after hours 7P-7A, please log into the web site [email or web address] and access using universal  password for that web site. If you do not have the password, please call the hospital operator.  08/30/2019, 6:42 PM

## 2019-08-30 NOTE — Progress Notes (Signed)
Patient is well known to me and I have been following intermittently since January. She has had a tumultuous journey since hospitalized 06/2019. Newly diagnosed scleroderma+rheumatoid arthritis complicated by rapid progression to renal failure now requiring hemodialysis, CREST syndrome, weight loss and functional status decline. She had dramatic improvement with the initiation of pulse dose 125 TID for 3-5 days of steroids started on 08/10/2019 and prior to going to CIR was ambulatory with a walker and conversant. Her steroids were tapered recently to 59m of prednisone.  Mrs. KNavarretelooks acutely ill and is quite toxic appearing today- she has worsening of the swelling in her knees, increased nodules, her skin appears to be inflamed again and very tense. Her throat is painful again and her husband notes that she is again complaining of pain from her pressure wound which had gotten much better.This is a dramatic step backward (really worse than even her worst day on her prior admission). Her husband was very hopefully she would sustain her progress towards recovery and explains that this dramatic change happened on Saturday (3 days ago).  I discussed goals of care with him given her current level of suffering. He tells me that he continues to feel that she needs specialized rheumatology care at a tertiary center, and while he appreciates the management of the providers here, that he will need this to be reassured that he has done everything before accepting that she will not survive and shift to comfort care. I agree with Mr. KWilmotthat if our goal is to find answers and to have specialized rheumatology guidance daily that a tertiary center is the best option. We tried to get her moved to a tertiary center last month but were unsuccessful due to covid restrictions and hospital capacities.  Recommendations:  1. Needs transfer to a tertiary center-her husband is willing to pay all transportation costs- [facility]  [facility] I will do my best to advocate for transfer to a tertiary center and research the options.  2. She appears to have had another flare of her Scleroderma and rheumatoid after a brief period of responsiveness to steroids-either flared because taper was to rapid or flared because there is another trigger such as infection. Her wound if much more painful to touch- possible deeper infection- consider eval for osteomyelitis- she is immune suppressed on steroids and may not show typical infection signs. May need empiric antibiotics started. Also is having liquid stools- check for c. Diff.  3. 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  4. I will continue to address goals of care and support this patient and her husband through this very serious illness.  ELane Hacker DO Palliative Medicine [phone]

## 2019-08-30 NOTE — Progress Notes (Signed)
CRITICAL VALUE ALERT  Critical Value:  CBG=41, pt alert and asymptomatic, will initiate hypoglycemic protocol  Date & Time Notied:  08/30/19; 0410  Provider Notified: Dr. Jerilynn Mages. Sharlet Salina  Orders Received/Actions taken: Awaiting further orders.

## 2019-08-31 DIAGNOSIS — K921 Melena: Secondary | ICD-10-CM

## 2019-08-31 DIAGNOSIS — K529 Noninfective gastroenteritis and colitis, unspecified: Secondary | ICD-10-CM

## 2019-08-31 DIAGNOSIS — L89154 Pressure ulcer of sacral region, stage 4: Secondary | ICD-10-CM

## 2019-08-31 DIAGNOSIS — Z8679 Personal history of other diseases of the circulatory system: Secondary | ICD-10-CM

## 2019-08-31 LAB — CBC
HCT: 31.5 % — ABNORMAL LOW (ref 36.0–46.0)
Hemoglobin: 9.3 g/dL — ABNORMAL LOW (ref 12.0–15.0)
MCH: 28.9 pg (ref 26.0–34.0)
MCHC: 29.5 g/dL — ABNORMAL LOW (ref 30.0–36.0)
MCV: 97.8 fL (ref 80.0–100.0)
Platelets: 219 10*3/uL (ref 150–400)
RBC: 3.22 MIL/uL — ABNORMAL LOW (ref 3.87–5.11)
RDW: 22.3 % — ABNORMAL HIGH (ref 11.5–15.5)
WBC: 7.7 10*3/uL (ref 4.0–10.5)
nRBC: 0.9 % — ABNORMAL HIGH (ref 0.0–0.2)

## 2019-08-31 LAB — TYPE AND SCREEN
ABO/RH(D): O POS
Antibody Screen: NEGATIVE

## 2019-08-31 LAB — GLUCOSE, CAPILLARY
Glucose-Capillary: 107 mg/dL — ABNORMAL HIGH (ref 70–99)
Glucose-Capillary: 122 mg/dL — ABNORMAL HIGH (ref 70–99)
Glucose-Capillary: 128 mg/dL — ABNORMAL HIGH (ref 70–99)
Glucose-Capillary: 135 mg/dL — ABNORMAL HIGH (ref 70–99)
Glucose-Capillary: 40 mg/dL — CL (ref 70–99)
Glucose-Capillary: 77 mg/dL (ref 70–99)
Glucose-Capillary: 77 mg/dL (ref 70–99)

## 2019-08-31 LAB — BASIC METABOLIC PANEL
Anion gap: 23 — ABNORMAL HIGH (ref 5–15)
BUN: 101 mg/dL — ABNORMAL HIGH (ref 8–23)
CO2: 15 mmol/L — ABNORMAL LOW (ref 22–32)
Calcium: 9.1 mg/dL (ref 8.9–10.3)
Chloride: 90 mmol/L — ABNORMAL LOW (ref 98–111)
Creatinine, Ser: 2.92 mg/dL — ABNORMAL HIGH (ref 0.44–1.00)
GFR calc Af Amer: 18 mL/min — ABNORMAL LOW (ref >60)
GFR calc non Af Amer: 16 mL/min — ABNORMAL LOW (ref >60)
Glucose, Bld: 161 mg/dL — ABNORMAL HIGH (ref 70–99)
Potassium: 4.7 mmol/L (ref 3.5–5.1)
Sodium: 128 mmol/L — ABNORMAL LOW (ref 135–145)

## 2019-08-31 MED ORDER — HEPARIN SODIUM (PORCINE) 1000 UNIT/ML IJ SOLN
INTRAMUSCULAR | Status: AC
  Filled 2019-08-31: qty 4

## 2019-08-31 MED ORDER — ALBUTEROL SULFATE (2.5 MG/3ML) 0.083% IN NEBU: 2.5000 mg | INHALATION_SOLUTION | RESPIRATORY_TRACT | Status: DC | PRN

## 2019-08-31 MED ORDER — METOPROLOL TARTRATE 5 MG/5ML IV SOLN
2.5000 mg | INTRAVENOUS | Status: AC | PRN
  Administered 2019-08-31 – 2019-09-01 (×2): 2.5 mg via INTRAVENOUS
  Filled 2019-08-31 (×2): qty 5

## 2019-08-31 MED ORDER — PANTOPRAZOLE SODIUM 40 MG IV SOLR
40.0000 mg | Freq: Two times a day (BID) | INTRAVENOUS | Status: DC
  Administered 2019-08-31 – 2019-09-01 (×3): 40 mg via INTRAVENOUS
  Filled 2019-08-31 (×4): qty 40

## 2019-08-31 MED ORDER — LORAZEPAM 2 MG/ML IJ SOLN
1.0000 mg | Freq: Once | INTRAMUSCULAR | Status: AC
  Administered 2019-08-31: 23:00:00 1 mg via INTRAVENOUS
  Filled 2019-08-31: qty 1

## 2019-08-31 NOTE — Progress Notes (Signed)
PROGRESS NOTE   Joanna Ali  TRR:[id number]    DOB: 08/26/1950    DOA: 09/14/2019  PCP: Renaldo Reel, DO   I have briefly reviewed patients previous medical records in [facility].  Chief Complaint:  Decline in functional and mental status with increasing drowsiness.   Brief Narrative:  69 year old female with PMH of HTN, chronic pericardial effusion, moderate aortic valve regurgitation, former smoker, alcohol abuse, had extended hospital stay 07/03/2019-08/23/2019.  Initially admitted for acute kidney injury, anasarca and fatigue, started on IV diuresis, echo showed moderate to large pericardial effusion.  S/p right VATS with pericardial window on 12/23. Progressive renal failure with intermittent hyperkalemia and uremia, hemodialysis initiated 12/31.  Dysphagia, poor oral intake and ongoing failure to thrive therefore PEG placed by IR on 1/8.  PMT consulted, suspected autoimmune process, rheumatology consulted for input and recommended starting high-dose steroids due to consideration for of scleroderma with crest syndrome.  Oncology was consulted due to question of malignancy given scleroderma diagnosis and they recommended outpatient evaluation.  Course complicated by PAF with RVR and ischemic-looking fingertips due to scleroderma/small vessel disease.  Beta-blockers changed to Cardizem due to worsening of Raynaud's phenomenon and she was started on slow steroid taper on 1/26.  Anuric requiring TTS HD.  Prednisone reduced to 50 mg daily on 2/8.  On 2/8 patient noted to be confused, hallucinating and delirious.  CT head without acute findings.  TRH was consulted, IV Solu-Medrol initiated without significant improvement in her lethargy and hence hospitalized.   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  Blood cultures x2: Negative to date.  MRSA PCR negative.  Chest x-ray 2/8 suggestive of CHF.  CT head 2/8: No acute intracranial abnormality.  Abdominal ultrasound 2/8: No significant abnormality.  As discussed with Dr. Hilma Favors, PMT on 2/10, she has clearly declined compared to couple of weeks ago.  Family reported this decline about 3 days PTA.  She was concerned regarding infectious etiology i.e. sacral wound and HSV-1 infection. ?  Hyponatremia as cause of her mental status changes.  Also intermittently hypoglycemic but do not believe her CBGs are accurate due to peripheral vasoconstriction from Raynaud's.  As per discussion with spouse at bedside on 2/11, mental status is improved by at least 50% since admission.  Scleroderma/rheumatoid/crest flare: Steroids were being gradually weaned down recently.  It is possible that her flare is due to taper of her steroid was too quick or another precipitating cause such as infection.  Continue IV Solu-Medrol.  As discussed with Dr. Hilma Favors, PMT, hopeful for transfer to work tertiary center with robust rheumatology availability.  She was going to try for such a transfer, awaiting input.  Stage IV sacral decubitus ulcer: May not demonstrate infectious signs due to immunocompromise status from autoimmune disease and steroids.  After lengthy discussion with Dr. Hilma Favors, consulted ID.  CT of the abdomen and pelvis did not show osteomyelitis.  Not on antibiotics.  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  IV acyclovir per ID.  ESRD: Nephrology following for dialysis needs.  1 hour into dialysis, patient developed transient tachycardia up to 180-200s, suspect transient A.  fib with RVR but otherwise reportedly remained stable.  Was taken off HD and transferred back to her room.  Back to sinus tachycardia in the  100s-110s.  Hyponatremia: Unclear etiology.?  Volume depletion.  Has improved from mid 120s to 130.  Could this have been the cause of her mental status change, Dr. Posey Pronto does not feel that this was a major contributor.  Hyperkalemia: Resolved.  Anemia: Stable.  Essential hypertension: Mildly uncontrolled.  Dysphagia/s/p PEG tube: Continue.  Intermittent hypoglycemia: Suspect fingerstick CBGs are inaccurate due to Raynaud's phenomenon.  Overnight checked her random blood glucose which was 110 while fingerstick CBG was low.  As per spouse, for this reason they were checking CBGs at her earlobe in rehab, continue that  Body mass index is 19.21 kg/m.   Adult failure to thrive: Multifactorial as noted above.  As discussed with Dr. Hilma Favors, PMT today, need to continue aggressive evaluation and management at this time.  I believe that even if she gets to a tertiary care facility to a rheumatologist, her overall prognosis seems poor.  Severe protein calorie malnutrition  Paroxysmal A. fib with RVR: Has history of same.  Was in sinus rhythm in the 90s-sinus tachycardia in the 100s this morning.  However while at HD developed brief A. fib with RVR up to 200s, taken off HD and has reverted back to ST in the 100-110's.  Continue to monitor on telemetry.  If has persistent A. fib then consider rate control medications.  Suspected melena: Noted on exam this morning and as per nursing report overnight.  However spouse indicates that patient has had dark stools for several weeks.  FOBT +.  Hemoglobin gradually drifting down, down from 12 g on 2/8-9.3 today.  GI/Dr. Benson Norway consulted and plans EGD in a.m.  Which will not only help to figure out cause for possible GI bleed but will also help regarding if any findings of scleroderma changes  Left-sided colitis: Noted on CT abdomen.  Low index of suspicion for C. difficile.  No antibiotics at this time.  Nutritional Status Nutrition Problem: Severe  Malnutrition Etiology: chronic illness(ESRD on HD, scleroderma) Signs/Symptoms: moderate muscle depletion, severe muscle depletion, moderate fat depletion, severe fat depletion Interventions: MVI, Tube feeding  DVT prophylaxis: SCD Code Status: Full Family Communication: I discussed in detail with patient spouse at bedside, updated care and answered all questions. Disposition:  . Patient came from: CIR           . Anticipated d/c place: To be determined . Barriers to d/c: Pending clinical improvement   Consultants:   Nephrology PMT Infectious disease GI/Dr. Benson Norway  Procedures:   HD  Antimicrobials:   IV acyclovir   Subjective:  Interviewed and examined patient along with spouse at bedside.  He indicates that her mental status is almost 50% better compared to prior to admission.  He reports ongoing dark stools for a couple of weeks, formed last evening.  As per RN report, multiple pasty consistency dark stools overnight, witnessed 1 this morning while examining her sacral wound.  No further history from patient.  Objective:   Vitals:   08/31/19 1430 08/31/19 1432 08/31/19 1539 08/31/19 1638  BP: (!) 139/105 (!) 140/105 (!) 156/104   Pulse: (!) 126 (!) 109 (!) 123 98  Resp:  (!) 25 (!) 28   Temp:  (!) 97.4 F (36.3 C)    TempSrc:  Axillary    SpO2:  100% 90%   Weight:  General exam: Elderly female, small built, frail and chronically ill looking sitting up at edge of bed with assistance from spouse.  Intermittently wanted to lay down and sit back up again.  Did not appear in any distress. Respiratory system: Diminished breath sounds in the bases/slightly harsh but otherwise clear to auscultation.  No increased work of breathing. Cardiovascular system: S1 & S2 heard, regular mild tachycardia. No JVD, murmurs, rubs, gallops or clicks. No pedal edema.  Telemetry personally reviewed this morning: SR in the 90s/ST in the 100s.  Later this afternoon, ST in the  100s-110s. Gastrointestinal system: Abdomen is nondistended, soft and nontender. No organomegaly or masses felt. Normal bowel sounds heard.  PEG tube site intact without acute findings. Central nervous system: Alert and oriented x2. No focal neurological deficits.  However did not appear as sharp, coherent or interactive as yesterday. Extremities: Moving all limbs symmetrically and follows simple instructions.  Dusky discoloration of the tip of her fingers. Skin: Generally taut and shiny skin, especially over her nose and her fingers.  Blackish discolored area under the tip of her nose/philtrum.  I did not see any vesicles.  Sacral wound exam without overt infection, as in picture below from 2/11. Psychiatry: Judgement and insight impaired. Mood & affect appropriate.       Data Reviewed:   I have personally reviewed following labs and imaging studies   CBC: Recent Labs  Lab 08/28/19 0534 08/28/19 0534 09/10/2019 1333 08/30/19 0144 08/31/19 0923  WBC 6.3   < > 6.2 5.5 7.7  NEUTROABS 4.7  --   --   --   --   HGB 12.0   < > 10.1* 10.9* 9.3*  HCT 41.1   < > 33.6* 37.1 31.5*  MCV 95.8   < > 93.6 96.1 97.8  PLT 148*   < > 216 196 219   < > = values in this interval not displayed.    Basic Metabolic Panel: Recent Labs  Lab 08/28/19 0542 08/28/19 0542 09/10/2019 1333 08/31/2019 1333 08/30/19 0144 08/30/19 2200 08/31/19 0923  NA 125*   < > 125*  --  130*  --  128*  K 5.0   < > 5.6*  --  4.1  --  4.7  CL 88*   < > 88*  --  91*  --  90*  CO2 20*   < > 19*  --  23  --  15*  GLUCOSE 79   < > 112*   < > 106* 101* 161*  BUN 81*   < > 140*  --  51*  --  101*  CREATININE 2.31*   < > 3.36*  --  1.86*  --  2.92*  CALCIUM 9.2   < > 8.9  --  8.5*  --  9.1  PHOS 2.5  --   --   --  2.7  --   --    < > = values in this interval not displayed.    Liver Function Tests: Recent Labs  Lab 08/28/19 0534 08/28/19 0534 08/28/19 0542 08/31/2019 1333 08/30/19 0144  AST 52*  --   --  32  --   ALT  25  --   --  24  --   ALKPHOS 121  --   --  99  --   BILITOT 0.7  --   --  0.9  --   PROT 5.7*  --   --  5.5*  --   ALBUMIN 2.9*   < >  2.8* 2.7* 2.7*   < > = values in this interval not displayed.    CBG: Recent Labs  Lab 08/31/19 0802 08/31/19 1214 08/31/19 1633  GLUCAP 77 128* 122*    Microbiology Studies:   Recent Results (from the past 240 hour(s))  Culture, blood (routine x 2)     Status: None (Preliminary result)   Collection Time: 09/17/2019  1:33 PM   Specimen: BLOOD RIGHT HAND  Result Value Ref Range Status   Specimen Description BLOOD RIGHT HAND  Final   Special Requests   Final    BOTTLES DRAWN AEROBIC ONLY Blood Culture adequate volume   Culture   Final    NO GROWTH 2 DAYS Performed at [facility], [place] [address] [place], [place] [address]    Report Status PENDING  Incomplete  Culture, blood (routine x 2)     Status: None (Preliminary result)   Collection Time: 09/09/2019  1:45 PM   Specimen: BLOOD  Result Value Ref Range Status   Specimen Description BLOOD RIGHT ANTECUBITAL  Final   Special Requests   Final    BOTTLES DRAWN AEROBIC ONLY Blood Culture results may not be optimal due to an inadequate volume of blood received in culture bottles   Culture   Final    NO GROWTH 2 DAYS Performed at [facility], [place] [address] [place], [place] [address]    Report Status PENDING  Incomplete     Radiology Studies:  CT ABDOMEN PELVIS WO CONTRAST  Result Date: 08/30/2019 CLINICAL DATA:  Failure to thrive, abdominal pain EXAM: CT ABDOMEN AND PELVIS WITHOUT CONTRAST TECHNIQUE: Multidetector CT imaging of the abdomen and pelvis was performed following the standard protocol without IV contrast. COMPARISON:  08/28/2019, 08/05/2019 FINDINGS: Lower chest: Bilateral pleural effusions and bibasilar atelectasis unchanged. Hepatobiliary: No focal liver abnormality is seen. No gallstones, gallbladder wall thickening, or biliary dilatation. Pancreas: Unremarkable. No  pancreatic ductal dilatation or surrounding inflammatory changes. Spleen: Normal in size without focal abnormality. Adrenals/Urinary Tract: No urinary tract calculi or obstructive uropathy. The bladder is unremarkable. The adrenals are normal. Stomach/Bowel: Percutaneous gastrostomy tube again noted. No bowel obstruction or ileus. There is wall thickening of sigmoid colon which may be inflammatory or infectious. Vascular/Lymphatic: No significant vascular findings are present. No enlarged abdominal or pelvic lymph nodes. Reproductive: Uterus and bilateral adnexa are unremarkable. Other: There is a small amount of free fluid in the pelvis, though increased since prior study. No free gas. Diffuse body wall edema unchanged. Musculoskeletal: No acute or destructive bony lesions. Reconstructed images demonstrate no additional findings. IMPRESSION: 1. Inflammatory or infectious colitis of the sigmoid colon. 2. Bilateral pleural effusions and bibasilar atelectasis unchanged. 3. Slight increase in pelvic free fluid. Electronically Signed   By: Randa Ngo M.D.   On: 08/30/2019 21:00   CT MAXILLOFACIAL WO CONTRAST  Result Date: 08/30/2019 CLINICAL DATA:  Failure to thrive EXAM: CT MAXILLOFACIAL WITHOUT CONTRAST TECHNIQUE: Multidetector CT imaging of the maxillofacial structures was performed. Multiplanar CT image reconstructions were also generated. COMPARISON:  None. FINDINGS: Osseous: No fracture or mandibular dislocation. No destructive process. Orbits: Negative. No traumatic or inflammatory finding. Sinuses: Clear Soft tissues: Negative Limited intracranial: No acute findings IMPRESSION: No evidence of facial or orbital fracture. No acute findings. Electronically Signed   By: Rolm Baptise M.D.   On: 08/30/2019 20:52     Scheduled Meds:   . Chlorhexidine Gluconate Cloth  6 each Topical Daily  . heparin      .  loratadine  10 mg Per Tube Daily  . methylPREDNISolone (SOLU-MEDROL) injection  125 mg Intravenous  Q8H  . multivitamin  1 tablet Oral QHS  . pantoprazole (PROTONIX) IV  40 mg Intravenous Q12H  . thiamine  100 mg Oral Daily    Continuous Infusions:   . acyclovir Stopped (08/30/19 1934)  . feeding supplement (NEPRO CARB STEADY) 1,000 mL (08/31/19 0014)     LOS: 2 days     Vernell Leep, MD, [place], [facility]. [facility]    To contact the attending provider between 7A-7P or the covering provider during after hours 7P-7A, please log into the web site [email or web address] and access using universal  password for that web site. If you do not have the password, please call the hospital operator.  08/31/2019, 5:02 PM

## 2019-08-31 NOTE — Progress Notes (Signed)
Pt's vitals at this time is 148/99, HR at 112, RR 22, O2 sat at 88 per nasal canula at 3 Liters O2, Labored breathing noted. Turned up O2 to 4, Oxygen sat at 90. Notified MD on call. Rapid response notified. Consulted RRT for further assessment.  Per RRT, pt's lung sounds are clear, no further treatment.  MD on call called back. Updated Per RRT assessment. MD ordered Lopressor and Ativan IV.  Will monitor pt.

## 2019-08-31 NOTE — Progress Notes (Signed)
Subjective: No new complaints   Antibiotics:  Anti-infectives (From admission, onward)   Start     Dose/Rate Route Frequency Ordered Stop   08/30/19 2200  valACYclovir (VALTREX) compounded oral suspension 500 mg  Status:  Discontinued     500 mg Per Tube 2 times daily 08/30/19 1348 08/30/19 1542   08/30/19 1700  acyclovir (ZOVIRAX) 215 mg in dextrose 5 % 100 mL IVPB     5 mg/kg  43 kg 104.3 mL/hr over 60 Minutes Intravenous Every 24 hours 08/30/19 1552     08/30/19 1345  valACYclovir (VALTREX) compounded oral suspension 500 mg  Status:  Discontinued     500 mg Per Tube 2 times daily 08/30/19 1344 08/30/19 1348      Medications: Scheduled Meds: . Chlorhexidine Gluconate Cloth  6 each Topical Daily  . loratadine  10 mg Per Tube Daily  . methylPREDNISolone (SOLU-MEDROL) injection  125 mg Intravenous Q8H  . multivitamin  1 tablet Oral QHS  . pantoprazole (PROTONIX) IV  40 mg Intravenous Q12H  . thiamine  100 mg Oral Daily   Continuous Infusions: . acyclovir Stopped (08/30/19 1934)  . feeding supplement (NEPRO CARB STEADY) 1,000 mL (08/31/19 0014)   PRN Meds:.acetaminophen, ALPRAZolam, antiseptic oral rinse, bisacodyl, camphor-menthol, metoprolol tartrate, ondansetron (ZOFRAN) IV, oxyCODONE    Objective: Weight change:   Intake/Output Summary (Last 24 hours) at 08/31/2019 1300 Last data filed at 08/31/2019 0815 Gross per 24 hour  Intake 419.77 ml  Output --  Net 419.77 ml   Blood pressure (!) 158/91, pulse 92, temperature (!) 97.4 F (36.3 C), temperature source Oral, resp. rate 18, weight 43 kg, SpO2 100 %. Temp:  [97.4 F (36.3 C)] 97.4 F (36.3 C) (02/10 1736) Pulse Rate:  [90-108] 92 (02/11 1044) Resp:  [18-28] 18 (02/11 1044) BP: (133-167)/(91-111) 158/91 (02/11 1044) SpO2:  [88 %-100 %] 100 % (02/11 1044)  Physical Exam: General: Alert and awake, oriented to person and being in the hospital Quite wasted HEENT: anicteric sclera, EOMI scabbed area  of nares visualized CVS regular rate, normal  Chest: , no wheezing, no respiratory distress Abdomen: soft non-distended,  Extremities: Moving wasting, Raynaud's changes  Wounds not examined today Neuro: nonfocal  CBC:    BMET Recent Labs    08/30/19 0144 08/30/19 0144 08/30/19 2200 08/31/19 0923  NA 130*  --   --  128*  K 4.1  --   --  4.7  CL 91*  --   --  90*  CO2 23  --   --  15*  GLUCOSE 106*   < > 101* 161*  BUN 51*  --   --  101*  CREATININE 1.86*  --   --  2.92*  CALCIUM 8.5*  --   --  9.1   < > = values in this interval not displayed.     Liver Panel  Recent Labs    09/12/2019 1333 08/30/19 0144  PROT 5.5*  --   ALBUMIN 2.7* 2.7*  AST 32  --   ALT 24  --   ALKPHOS 99  --   BILITOT 0.9  --        Sedimentation Rate No results for input(s): ESRSEDRATE in the last 72 hours. C-Reactive Protein No results for input(s): CRP in the last 72 hours.  Micro Results: Recent Results (from the past 720 hour(s))  MRSA PCR Screening     Status: None   Collection Time: 08/21/19  9:45 AM   Specimen: Nasal Mucosa; Nasopharyngeal  Result Value Ref Range Status   MRSA by PCR NEGATIVE NEGATIVE Final    Comment:        The GeneXpert MRSA Assay (FDA approved for NASAL specimens only), is one component of a comprehensive MRSA colonization surveillance program. It is not intended to diagnose MRSA infection nor to guide or monitor treatment for MRSA infections. Performed at [facility], [place] [address], [place], [place] [address]   Culture, blood (routine x 2)     Status: None (Preliminary result)   Collection Time: 08/24/2019  1:33 PM   Specimen: BLOOD RIGHT HAND  Result Value Ref Range Status   Specimen Description BLOOD RIGHT HAND  Final   Special Requests   Final    BOTTLES DRAWN AEROBIC ONLY Blood Culture adequate volume   Culture   Final    NO GROWTH 2 DAYS Performed at [facility], [place] [address] [place], [place] [address]    Report  Status PENDING  Incomplete  Culture, blood (routine x 2)     Status: None (Preliminary result)   Collection Time: 09/09/2019  1:45 PM   Specimen: BLOOD  Result Value Ref Range Status   Specimen Description BLOOD RIGHT ANTECUBITAL  Final   Special Requests   Final    BOTTLES DRAWN AEROBIC ONLY Blood Culture results may not be optimal due to an inadequate volume of blood received in culture bottles   Culture   Final    NO GROWTH 2 DAYS Performed at [facility], [place] [address], [place],  [address]    Report Status PENDING  Incomplete    Studies/Results: CT ABDOMEN PELVIS WO CONTRAST  Result Date: 08/30/2019 CLINICAL DATA:  Failure to thrive, abdominal pain EXAM: CT ABDOMEN AND PELVIS WITHOUT CONTRAST TECHNIQUE: Multidetector CT imaging of the abdomen and pelvis was performed following the standard protocol without IV contrast. COMPARISON:  08/28/2019, 08/05/2019 FINDINGS: Lower chest: Bilateral pleural effusions and bibasilar atelectasis unchanged. Hepatobiliary: No focal liver abnormality is seen. No gallstones, gallbladder wall thickening, or biliary dilatation. Pancreas: Unremarkable. No pancreatic ductal dilatation or surrounding inflammatory changes. Spleen: Normal in size without focal abnormality. Adrenals/Urinary Tract: No urinary tract calculi or obstructive uropathy. The bladder is unremarkable. The adrenals are normal. Stomach/Bowel: Percutaneous gastrostomy tube again noted. No bowel obstruction or ileus. There is wall thickening of sigmoid colon which may be inflammatory or infectious. Vascular/Lymphatic: No significant vascular findings are present. No enlarged abdominal or pelvic lymph nodes. Reproductive: Uterus and bilateral adnexa are unremarkable. Other: There is a small amount of free fluid in the pelvis, though increased since prior study. No free gas. Diffuse body wall edema unchanged. Musculoskeletal: No acute or destructive bony lesions. Reconstructed images  demonstrate no additional findings. IMPRESSION: 1. Inflammatory or infectious colitis of the sigmoid colon. 2. Bilateral pleural effusions and bibasilar atelectasis unchanged. 3. Slight increase in pelvic free fluid. Electronically Signed   By: Randa Ngo M.D.   On: 08/30/2019 21:00   CT MAXILLOFACIAL WO CONTRAST  Result Date: 08/30/2019 CLINICAL DATA:  Failure to thrive EXAM: CT MAXILLOFACIAL WITHOUT CONTRAST TECHNIQUE: Multidetector CT imaging of the maxillofacial structures was performed. Multiplanar CT image reconstructions were also generated. COMPARISON:  None. FINDINGS: Osseous: No fracture or mandibular dislocation. No destructive process. Orbits: Negative. No traumatic or inflammatory finding. Sinuses: Clear Soft tissues: Negative Limited intracranial: No acute findings IMPRESSION: No evidence of facial or orbital fracture. No acute findings. Electronically Signed  By: Rolm Baptise M.D.   On: 08/30/2019 20:52      Assessment/Plan:  INTERVAL HISTORY:   CT maxillofacial unrevealing  CT abdomen showed area in the sigmoid colon that was inflamed  She has had some Hemoccult positive stools with melena   Principal Problem:   Failure to thrive in adult Active Problems:   Hypertension   Protein-calorie malnutrition, severe   Pressure injury of skin   Dysphagia   ESRD on hemodialysis (HCC)   Scleroderma progressive (HCC)   CREST syndrome (HCC)   Anemia of chronic disease   Stage III pressure ulcer of sacral region ([place])    Joanna Ali is a 69 y.o. female with progressive scleroderma/crest, history of pericardial disease end-stage renal disease on hemodialysis who was on the rehab unit after improving on corticosteroids now readmitted to the medicine service after worsening encephalopathy.  Her encephalopathy reportedly improved initially with steroids and seems improved overnight as well.  I started on IV acyclovir in case she had HSV encephalitis.   CT scan of her abdomen  and pelvis does not show evidence graphically for osteomyelitis in the area of her stage IV sacral decubitus ulcer.  Was an area of inflammation in the sigmoid colon which to me is nonspecific.  I do not think she has C. difficile colitis  CT maxillofacial was unrevealing.  I would continue IV acyclovir for now  If she is improved suppressed sufficiently would get an MRI of the brain to look for temporal lobe enhancement or lack thereof.  Overall continue I continue to believe her long-term prognosis is poor   LOS: 2 days   Alcide Evener 08/31/2019, 1:00 PM

## 2019-08-31 NOTE — Progress Notes (Signed)
Patient ID: Joanna Ali, female   DOB: 02-15-1951, 69 y.o.   MRN: [id number] [facility] Progress Note   Assessment/ Plan:   1.  Altered mental status/encephalopathy: She has remained encephalopathic overnight with minimal verbal responses.  Without evidence of hepatic encephalopathy or intracranial pathology and CT scan.  Mental status not significantly better with higher dose Solu-Medrol.  Attempting transfer to tertiary center with inpatient rheumatology. 2. ESRD: With acute kidney injury that has been dialysis dependent now for the past 6 weeks or so.  Permanent dialysis access placement limited by her Raynaud's and evidence of vascular insufficiency with continued hemodialysis via TDC.  Continue hemodialysis on a TTS schedule- she remains very deconditioned and lethargic which raises questions regarding the utility of continued dialysis. 3. Anemia: Without overt blood loss, rising hemoglobin/hematocrit appears to be reflective of hemoconcentration. 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 5. Nutrition: With low albumin level and likely poor intake secondary to encephalopathy. 6. Hypertension: Blood pressure within acceptable range  Subjective:   With some dark/tarry appearing stools noted overnight.  Remains lethargic.  Discussed overnight events and plans with Dr. Algis Liming.   Objective:   BP (!) 156/101 (BP Location: Right Arm)   Pulse 98   Temp (!) 97.4 F (36.3 C) (Oral)   Resp 18   Wt 43 kg   SpO2 99%   BMI 15.30 kg/m   Physical Exam: Gen: Appears uncomfortable resting in bed, poor response to conversation CVS: Pulse regular rhythm, normal rate, S1 and S2 normal Resp: Clear to auscultation, no rales/rhonchi Abd: Soft, flat, nontender Ext: 1+ bilateral lower extremity edema, right IJ TDC.  Labs: BMET Recent Labs  Lab 08/24/19 1315 08/28/19 0542 08/26/2019 1333 08/30/19 0144 08/30/19 2200  NA 130* 125* 125* 130*   --   K 4.3 5.0 5.6* 4.1  --   CL 90* 88* 88* 91*  --   CO2 26 20* 19* 23  --   GLUCOSE 100* 79 112* 106* 101*  BUN 119* 81* 140* 51*  --   CREATININE 3.22* 2.31* 3.36* 1.86*  --   CALCIUM 8.8* 9.2 8.9 8.5*  --   PHOS 3.1 2.5  --  2.7  --    CBC Recent Labs  Lab 08/28/19 0534 08/21/2019 1333 08/30/19 0144 08/31/19 0923  WBC 6.3 6.2 5.5 7.7  NEUTROABS 4.7  --   --   --   HGB 12.0 10.1* 10.9* 9.3*  HCT 41.1 33.6* 37.1 31.5*  MCV 95.8 93.6 96.1 97.8  PLT 148* 216 196 219      Medications:    . Chlorhexidine Gluconate Cloth  6 each Topical Daily  . loratadine  10 mg Per Tube Daily  . methylPREDNISolone (SOLU-MEDROL) injection  125 mg Intravenous Q8H  . multivitamin  1 tablet Oral QHS  . pantoprazole (PROTONIX) IV  40 mg Intravenous Q12H  . thiamine  100 mg Oral Daily   Elmarie Shiley, MD 08/31/2019, 10:20 AM

## 2019-08-31 NOTE — Progress Notes (Signed)
Patient arrived on the floor with labored breathing and her O2 sat was 82% on RA. Patient was placed on 2L [place] per MD order. Patient was later able to breath normal and was able to respond to voice. MD order to reduce tx time to 2hrs.

## 2019-08-31 NOTE — Progress Notes (Addendum)
I was notified by central telemetry that patient was having Sinus Tachycardia; patient is at hemodialysis unit at this time, HD nurse notified and they claimed that they have notified MD about patient's status. Stat EKG was ordered per dialysis nurse. Awaiting patient to return back to 6N at this time.

## 2019-08-31 NOTE — Consult Note (Signed)
Reason for Consult: Heme positive stool, melena, and anemia Referring Physician: [facility]  Joanna Ali HPI: This is a 69 year old female with multiple medical problems.  Briefly she was discharged to CIR after being in the hospital from 07/03/2019-08/23/2019 and [name].  While in CIR she did not improve, in fact, her mentation worsened.  Per Dr. Hilma Favors, her current clinical state is much worse than it was during her prior hospitalization.  During this hospitalization she was noted to have a progressive anemia.  A CT scan of the abdomen was positive for a sigmoid colitis and her husband reports for throat pain.  She is s/p PEG placement from the prior hospitalization.  For some weeks, per her husband, she was having dark stools.  The stool was tested here in the hospital and it was heme positive.  Her husband also reports that she had a colonoscopy in 2014 as a routine examination in [place].  While in [facility], recently, [name], per her husband.  He thinks the examination was normal.   Past Medical History:  Diagnosis Date  . Atrial fibrillation with RVR ([place])   . 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 ESRD on hemodialysis ([place])   . Hypertension   . Pericardial effusion 06/2019    Past Surgical History:  Procedure Laterality Date  . APPENDECTOMY    . IR FLUORO GUIDE CV LINE LEFT  07/18/2019  . IR GASTROSTOMY TUBE MOD SED  07/28/2019  . IR US GUIDE VASC ACCESS LEFT  07/18/2019  . NECK SURGERY    . RIGHT AND LEFT HEART CATH N/A 07/06/2019   Procedure: RIGHT AND LEFT HEART CATH;  Surgeon: Jolaine Artist, MD;  Location: [facility];  Service: Cardiovascular;  Laterality: N/A;  . TEE WITHOUT CARDIOVERSION N/A 03/28/2019   Procedure: TRANSESOPHAGEAL ECHOCARDIOGRAM (TEE);  Surgeon: Geralynn Rile, MD;  Location: [facility];  Service: Cardiology;  Laterality: N/A;  . VIDEO ASSISTED THORACOSCOPY Right 07/12/2019    Procedure: VIDEO ASSISTED THORACOSCOPY Pericardial window ;  Surgeon: Lajuana Matte, MD;  Location: [facility];  Service: Thoracic;  Laterality: Right;    Family History  Problem Relation Age of Onset  . Hypertension Mother     Social History:  reports that she quit smoking about 40 years ago. Her smoking use included cigarettes. [name]. She has never used smokeless tobacco. [name].  Allergies: No Known Allergies  Medications:  Scheduled: . Chlorhexidine Gluconate Cloth  6 each Topical Daily  . loratadine  10 mg Per Tube Daily  . methylPREDNISolone (SOLU-MEDROL) injection  125 mg Intravenous Q8H  . multivitamin  1 tablet Oral QHS  . pantoprazole (PROTONIX) IV  40 mg Intravenous Q12H  . thiamine  100 mg Oral Daily   Continuous: . acyclovir Stopped (08/30/19 1934)  . feeding supplement (NEPRO CARB STEADY) 1,000 mL (08/31/19 0014)    Results for orders placed or performed during the hospital encounter of 09/06/2019 (from the past 24 hour(s))  Glucose, capillary     Status: Abnormal   Collection Time: 08/30/19  4:53 PM  Result Value Ref Range   Glucose-Capillary 54 (L) 70 - 99 mg/dL  Glucose, capillary     Status: Abnormal   Collection Time: 08/30/19  5:35 PM  Result Value Ref Range   Glucose-Capillary 68 (L) 70 - 99 mg/dL  Glucose, capillary  Status: Abnormal   Collection Time: 08/30/19  6:16 PM  Result Value Ref Range   Glucose-Capillary 66 (L) 70 - 99 mg/dL   Comment 1 Document in Chart   Glucose, capillary     Status: None   Collection Time: 08/30/19  6:48 PM  Result Value Ref Range   Glucose-Capillary 83 70 - 99 mg/dL  Glucose, capillary     Status: Abnormal   Collection Time: 08/30/19  8:03 PM  Result Value Ref Range   Glucose-Capillary 60 (L) 70 - 99 mg/dL  Glucose, random     Status: Abnormal   Collection Time: 08/30/19 10:00 PM  Result Value Ref Range   Glucose, Bld 101 (H)  70 - 99 mg/dL  Glucose, capillary     Status: Abnormal   Collection Time: 08/30/19 10:49 PM  Result Value Ref Range   Glucose-Capillary 40 (LL) 70 - 99 mg/dL   Comment 1 Notify RN   Glucose, capillary     Status: None   Collection Time: 08/31/19 12:33 AM  Result Value Ref Range   Glucose-Capillary 77 70 - 99 mg/dL  Glucose, capillary     Status: Abnormal   Collection Time: 08/31/19  4:31 AM  Result Value Ref Range   Glucose-Capillary 107 (H) 70 - 99 mg/dL  Glucose, capillary     Status: None   Collection Time: 08/31/19  8:02 AM  Result Value Ref Range   Glucose-Capillary 77 70 - 99 mg/dL  Type and screen [place]     Status: None   Collection Time: 08/31/19  9:20 AM  Result Value Ref Range   ABO/RH(D) O POS    Antibody Screen NEG    Sample Expiration      09/03/2019,2359 Performed at [facility], [place] [address] [place], [place] [address]   CBC     Status: Abnormal   Collection Time: 08/31/19  9:23 AM  Result Value Ref Range   WBC 7.7 4.0 - 10.5 K/uL   RBC 3.22 (L) 3.87 - 5.11 MIL/uL   Hemoglobin 9.3 (L) 12.0 - 15.0 g/dL   HCT 31.5 (L) 36.0 - 46.0 %   MCV 97.8 80.0 - 100.0 fL   MCH 28.9 26.0 - 34.0 pg   MCHC 29.5 (L) 30.0 - 36.0 g/dL   RDW 22.3 (H) 11.5 - 15.5 %   Platelets 219 150 - 400 K/uL   nRBC 0.9 (H) 0.0 - 0.2 %  Basic metabolic panel     Status: Abnormal   Collection Time: 08/31/19  9:23 AM  Result Value Ref Range   Sodium 128 (L) 135 - 145 mmol/L   Potassium 4.7 3.5 - 5.1 mmol/L   Chloride 90 (L) 98 - 111 mmol/L   CO2 15 (L) 22 - 32 mmol/L   Glucose, Bld 161 (H) 70 - 99 mg/dL   BUN 101 (H) 8 - 23 mg/dL   Creatinine, Ser 2.92 (H) 0.44 - 1.00 mg/dL   Calcium 9.1 8.9 - 10.3 mg/dL   GFR calc non Af Amer 16 (L) >60 mL/min   GFR calc Af Amer 18 (L) >60 mL/min   Anion gap 23 (H) 5 - 15  Glucose, capillary     Status: Abnormal   Collection Time: 08/31/19 12:14 PM  Result Value Ref Range   Glucose-Capillary 128 (H) 70 - 99 mg/dL      CT ABDOMEN PELVIS WO CONTRAST  Result Date: 08/30/2019 CLINICAL DATA:  Failure to thrive, abdominal pain EXAM:  CT ABDOMEN AND PELVIS WITHOUT CONTRAST TECHNIQUE: Multidetector CT imaging of the abdomen and pelvis was performed following the standard protocol without IV contrast. COMPARISON:  08/28/2019, 08/05/2019 FINDINGS: Lower chest: Bilateral pleural effusions and bibasilar atelectasis unchanged. Hepatobiliary: No focal liver abnormality is seen. No gallstones, gallbladder wall thickening, or biliary dilatation. Pancreas: Unremarkable. No pancreatic ductal dilatation or surrounding inflammatory changes. Spleen: Normal in size without focal abnormality. Adrenals/Urinary Tract: No urinary tract calculi or obstructive uropathy. The bladder is unremarkable. The adrenals are normal. Stomach/Bowel: Percutaneous gastrostomy tube again noted. No bowel obstruction or ileus. There is wall thickening of sigmoid colon which may be inflammatory or infectious. Vascular/Lymphatic: No significant vascular findings are present. No enlarged abdominal or pelvic lymph nodes. Reproductive: Uterus and bilateral adnexa are unremarkable. Other: There is a small amount of free fluid in the pelvis, though increased since prior study. No free gas. Diffuse body wall edema unchanged. Musculoskeletal: No acute or destructive bony lesions. Reconstructed images demonstrate no additional findings. IMPRESSION: 1. Inflammatory or infectious colitis of the sigmoid colon. 2. Bilateral pleural effusions and bibasilar atelectasis unchanged. 3. Slight increase in pelvic free fluid. Electronically Signed   By: Randa Ngo M.D.   On: 08/30/2019 21:00   CT MAXILLOFACIAL WO CONTRAST  Result Date: 08/30/2019 CLINICAL DATA:  Failure to thrive EXAM: CT MAXILLOFACIAL WITHOUT CONTRAST TECHNIQUE: Multidetector CT imaging of the maxillofacial structures was performed. Multiplanar CT image reconstructions were also generated. COMPARISON:  None.  FINDINGS: Osseous: No fracture or mandibular dislocation. No destructive process. Orbits: Negative. No traumatic or inflammatory finding. Sinuses: Clear Soft tissues: Negative Limited intracranial: No acute findings IMPRESSION: No evidence of facial or orbital fracture. No acute findings. Electronically Signed   By: Rolm Baptise M.D.   On: 08/30/2019 20:52    ROS:  As stated above in the HPI otherwise negative.  Blood pressure (!) 148/100, pulse 96, temperature (!) 96.4 F (35.8 C), temperature source Axillary, resp. rate (!) 25, weight 54 kg, SpO2 100 %.    PE: Gen: lying on her right side in a semi-fetal position, no able to respond Lungs: CTA Bilaterally CV: RRR without M/G/R ABM: Soft, NTND, +BS Ext: No C/C/E  Assessment/Plan: 1) Anemia. 2) Melenic stools. 3) Heme positive stool. 4) Sigmoid colon colitis. 5) Multiple medical problems.   There is an issue with a mildly progressive anemia and it is confirmed to be heme positive.  She may have issues with esophagitis as Scleroderma can be infiltrative in the esophagus.  This will render the LES incompetent and allow for reflux of acid.  Further endoscopic work up will be pursued.  There was also the finding of a possible colitis in the sigmoid colon.  I discussed the plan of action with her husband over the phone and he desires to proceed with the interventions.  Plan: 1) EGD/FFS  Sheldon Sem D 08/31/2019, 1:41 PM

## 2019-08-31 NOTE — Progress Notes (Signed)
As this RN was checking on patient, noticed some dried blood on patients L neck, cleaned and assessed the area and noticed a small opening which is actively bleeding, applied some pressure and covered it with some dry gauze, notified MD and will continue to monitor with remainder of shift.

## 2019-08-31 NOTE — Progress Notes (Signed)
Received patient back to 6N 27.

## 2019-08-31 NOTE — Progress Notes (Addendum)
CRITICAL VALUE ALERT  Critical Value:  CBG = 60, pt asymptomatic  Date & Time Notied:  08/30/19  Provider Notified: M. Denney  Orders Received/Actions taken: Was given instructions by off going nurse to have sugars taken by LAB STAT  once pts sugar <90. STAT CBG was taken after patient came from [place], result is 101, then this RN checked using our glucometer and it was 40, notified MD about inaccuracy, will continue to monitor patient with remainder of shift.

## 2019-08-31 NOTE — Progress Notes (Signed)
When this RN came from lunch patient was off treatment, when asked the CN said central telemetry call and said patient HR was above 200, she notified MD and the MD ordered STAT EKG and to stop treatment. The floor nurse was notified and patient transported back to her room

## 2019-08-31 NOTE — Progress Notes (Addendum)
Rapid Response Event Note  Overview: Called for pt having red MEWs of 4. Pt has just returned from dialysis. I am told no fluid was removed during treatment. RN has already performed a 12 lead EKG. RN has been in touch with primary MD in regards to pt's vital signs and is awaiting orders. BP 156/104, HR 123, RR 28, SpO2 90%. RN states that pt is wearing 3L [place]. RN does not request RR-RN at this time, just wanted to make RR-RN aware of MEWS.   Plan:  -Trend VS per MEWS protocol -Maintain oxygen saturation >= 92% -Await and implement MD orders -Call rapid response for further needs   Casimer Bilis

## 2019-08-31 NOTE — Progress Notes (Addendum)
Patient left unit for hemodialysis.

## 2019-08-31 NOTE — Progress Notes (Signed)
Nutrition Follow-up  DOCUMENTATION CODES:   Severe malnutrition in context of chronic illness  INTERVENTION:   -Continue renal MVI daily -Continue Nepro @ 45 ml/hr via g-tube   Tube feeding regimen provides 1944 kcal (100% of needs), 87 grams of protein, and 785 ml of H2O.   NUTRITION DIAGNOSIS:   Severe Malnutrition related to chronic illness(ESRD on HD, scleroderma) as evidenced by moderate muscle depletion, severe muscle depletion, moderate fat depletion, severe fat depletion.  Ongoing  GOAL:   Patient will meet greater than or equal to 90% of their needs  Progressing   MONITOR:   PO intake, Supplement acceptance, Diet advancement, Labs, Weight trends, TF tolerance, Skin  REASON FOR ASSESSMENT:   Consult Enteral/tube feeding initiation and management  ASSESSMENT:   Joanna Ali is a 69 y.o. female with medical history significant of extended hospital stay 07/03/19-08/23/19 and then discharged to CIR. She did undergo dialysis 08/26/19 and afterwards is normally tired and sleepy for 5-6 hours per husband. Her husband does help to provide all history as patient cannot as well as her rehab MD. She was sleepy most of that day and into the weekend. She never really regained her functional status. She at maximum improvement at CIR was able to sit up at the side of the bed. Since that time she has not been able to move much and is sleeping most of the day. She does have new diagnosis of progressive scleroderma/CREST from recent admission and was on steroid taper. That was decreased to 30 mg prednisone daily several days prior to decline in function. No other recent medication changes have been made. Initially she was having some tenderness in her stomach but this is now resolved. She does have indwelling tube feed and temporary dialysis catheter but no pain or discharge from those locations or redness. She has not had fevers or chills at CIR. She denies chest pains or SOB. No change in  her breathing per staff and husband. No new diarrhea, nausea, vomiting. She does not make urine and does HD Tu/Th/Sat. Most recent HD 08/25/18. She does not have new swelling in feet or ankles. She does have a pressure wound on sacrum and several skin breakdown on her feet but these are not changed recently. 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 Her husband and herself have prior relationship with palliative care Dr. Hilma Favors who was involved with their care previous admission.  12/15-s/p bilateral thoracentesiswith700 ml fluid removed 12/17-s/p cardiac cath 12/23-s/p VATS, pericardial window 12/30-Cortrak placed, tip in stomach 01/7-TF switched to Nepro due to persistent hyperkalemia, Cortrak removed 01/8-s/p G-tube placement 01/9-steroids initiated 01/12-s/p bilateral thoracentesis with 1600 ml total fluid removed  Reviewed I/O's: +390 ml x 24 hours and +200 ml x 24 hours  Pt remains on clear liquid diet, however, intake minimal due to mental status. Pt sleeping soundly at time of visit and did not respond to name being called.   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   Nepro infusing via PEG at 45 ml/hr, which provides 1944 kcals, 87 grams protein, and 785 ml free water daily, meeting 100% of estimated kcal and protein needs.   Palliative care following for goals of care discussions; pt husband requesting transfer to tertiary care center specializing in rheumatology; PCT attempting to arrange.   Labs reviewed: CBGS: 77-107.   Diet Order:   Diet Order  Diet clear liquid Room service appropriate? Yes; Fluid consistency: Thin  Diet effective now              EDUCATION NEEDS:   No education needs have been identified at this time  Skin:  Skin Assessment: Skin Integrity Issues: Skin Integrity Issues:: Stage IV Stage II: - Stage III: coccyx Stage  IV: sacrum with exposed bone Unstageable: nose Incisions: closed rt chest Other: -  Last BM:  08/30/19  Height:   Ht Readings from Last 1 Encounters:  07/04/19 5' 6"  (1.676 m)    Weight:   Wt Readings from Last 1 Encounters:  08/30/19 43 kg    Ideal Body Weight:  59.1 kg  BMI:  Body mass index is 15.3 kg/m.  Estimated Nutritional Needs:   Kcal:  1800-2000  Protein:  90-105 grams  Fluid:  1000 ml + UOP    Loistine Chance, RD, LDN, [place] Registered Dietitian II Certified Diabetes Care and Education Specialist Please refer to [facility] for RD and/or RD on-call/weekend/after hours pager

## 2019-08-31 NOTE — Progress Notes (Signed)
Patient has been going continuously with mushy consistency black stools, patient has been + for fecal occult blood test on 2/7, notified MD. Cleaned up patient about 5-6x times with NT. This RN asked for flexi seal order from MD but didn't push through because patient has hemorrhoids. Tried using rectal pouch but didn't work well. Cleaned patient and moisture barrier cream applied to area then did dressing change of sacrum with aquacel and covered with a new foam dressing.

## 2019-08-31 NOTE — Progress Notes (Signed)
Follow up from previous call regarding RED MEWS 4. Pt is tachypneic however not in distress at this time. She is only arousable to painful stimuli. Difficult to assess accurate oxygen saturations and sats on her forehead with good correlation are 94-95% on 4LNC.   Vital Signs MEWS/VS Documentation      08/31/2019 2129 08/31/2019 2230 08/31/2019 2245 08/31/2019 2300   MEWS Score:  -  3  3  2    MEWS Score Color:  -  Yellow  Yellow  Yellow   Resp:  -  (!) 22  -  -   Pulse:  -  (!) 104  (!) 108  96   BP:  -  (!) 155/102  (!) 151/99  (!) 147/101   Temp:  -  -  97.6 F (36.4 C)  -   O2 Device:  Nasal Cannula  Nasal Cannula  -  -   O2 Flow Rate (L/min):  4 L/min  4 L/min  -  -     Plan:  -Trend VS per MEWS protocol -Maintain oxygen saturation >= 92% -Await and implement MD orders -Call rapid response for further needs      Madelynn Done 08/31/2019,11:14 PM

## 2019-09-01 ENCOUNTER — Encounter (HOSPITAL_COMMUNITY): Admission: AD | Disposition: E | Payer: Self-pay | Source: Intra-hospital | Attending: Internal Medicine

## 2019-09-01 ENCOUNTER — Inpatient Hospital Stay (HOSPITAL_COMMUNITY): Payer: Medicare Other

## 2019-09-01 DIAGNOSIS — I7300 Raynaud's syndrome without gangrene: Secondary | ICD-10-CM

## 2019-09-01 DIAGNOSIS — R400 Somnolence: Secondary | ICD-10-CM

## 2019-09-01 LAB — BASIC METABOLIC PANEL
Anion gap: 23 — ABNORMAL HIGH (ref 5–15)
BUN: 93 mg/dL — ABNORMAL HIGH (ref 8–23)
CO2: 16 mmol/L — ABNORMAL LOW (ref 22–32)
Calcium: 8.8 mg/dL — ABNORMAL LOW (ref 8.9–10.3)
Chloride: 94 mmol/L — ABNORMAL LOW (ref 98–111)
Creatinine, Ser: 2.55 mg/dL — ABNORMAL HIGH (ref 0.44–1.00)
GFR calc Af Amer: 21 mL/min — ABNORMAL LOW (ref >60)
GFR calc non Af Amer: 18 mL/min — ABNORMAL LOW (ref >60)
Glucose, Bld: 141 mg/dL — ABNORMAL HIGH (ref 70–99)
Potassium: 5.8 mmol/L — ABNORMAL HIGH (ref 3.5–5.1)
Sodium: 133 mmol/L — ABNORMAL LOW (ref 135–145)

## 2019-09-01 LAB — CBC
HCT: 31.4 % — ABNORMAL LOW (ref 36.0–46.0)
Hemoglobin: 9.5 g/dL — ABNORMAL LOW (ref 12.0–15.0)
MCH: 29 pg (ref 26.0–34.0)
MCHC: 30.3 g/dL (ref 30.0–36.0)
MCV: 95.7 fL (ref 80.0–100.0)
Platelets: 217 10*3/uL (ref 150–400)
RBC: 3.28 MIL/uL — ABNORMAL LOW (ref 3.87–5.11)
RDW: 22.5 % — ABNORMAL HIGH (ref 11.5–15.5)
WBC: 8 10*3/uL (ref 4.0–10.5)
nRBC: 3.8 % — ABNORMAL HIGH (ref 0.0–0.2)

## 2019-09-01 LAB — GLUCOSE, CAPILLARY
Glucose-Capillary: 125 mg/dL — ABNORMAL HIGH (ref 70–99)
Glucose-Capillary: 147 mg/dL — ABNORMAL HIGH (ref 70–99)
Glucose-Capillary: 149 mg/dL — ABNORMAL HIGH (ref 70–99)
Glucose-Capillary: 155 mg/dL — ABNORMAL HIGH (ref 70–99)

## 2019-09-01 SURGERY — ESOPHAGOGASTRODUODENOSCOPY (EGD) WITH PROPOFOL
Anesthesia: Monitor Anesthesia Care

## 2019-09-01 MED ORDER — HYDROMORPHONE HCL 1 MG/ML IJ SOLN
INTRAMUSCULAR | Status: AC
  Filled 2019-09-01: qty 1

## 2019-09-01 MED ORDER — HYDROMORPHONE HCL 1 MG/ML IJ SOLN
0.5000 mg | INTRAMUSCULAR | Status: DC | PRN
  Administered 2019-09-01 – 2019-09-02 (×5): 0.5 mg via INTRAVENOUS
  Filled 2019-09-01 (×4): qty 1

## 2019-09-01 MED ORDER — METHYLPREDNISOLONE SODIUM SUCC 125 MG IJ SOLR
80.0000 mg | INTRAMUSCULAR | Status: DC
  Administered 2019-09-02: 80 mg via INTRAVENOUS
  Filled 2019-09-01: qty 2

## 2019-09-01 NOTE — Progress Notes (Signed)
Pt's breathing remained labored at rest. Pt's still on 4 L Oxygen at 90% to 92% Saturation. Pt's HR is now running from 96 to 110.

## 2019-09-01 NOTE — Social Work (Signed)
CSW acknowledging transition to comfort for pt per Dr. Hilma Favors, PMT providers notes. TOC team available for consult should hospice services or residential hospice become appropriate.   Alexander Mt, MSW, [place] [facility]

## 2019-09-01 NOTE — Progress Notes (Signed)
Nutrition Brief Note  Chart reviewed. Pt now transitioning to comfort care.  No further nutrition interventions warranted at this time.  Please re-consult as needed.   Camree Wigington W, RD, LDN, [facility] Registered Dietitian II Certified Diabetes Care and Education Specialist Please refer to [facility] for RD and/or RD on-call/weekend/after hours pager  

## 2019-09-01 NOTE — Progress Notes (Addendum)
Significant decline overnight. Mrs. Wermuth appears to be active dying. I met with her husband, along with Dr. Algis Liming to discuss her condition and goals of care. Currently we are acknowledging that all possible interventions and avenues to treat her have been exhausted. 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 I have discussed the terminal nature of this disease with Mr. Brinton.  We have agreed on the following:  1. DNR 2. He is considering hospice options and terms of transition to comfort care- for now he wants to leave telemetry monitoring on and pulse oximeter at least for spot checks.. 3. Should stop tube feeding given abdominal distention- will monitor this. 4. Will order hydromorphone IV PRN for comfort  I have given my contact information to Mr. Hiltunen if he has questions or I can support him.  Will follow her closely.  Lane Hacker, DO Palliative Medicine [phone]

## 2019-09-01 NOTE — Progress Notes (Addendum)
Wasted 0.5 mg of Hydromorphone with nurse Racheal Patches

## 2019-09-01 NOTE — Progress Notes (Signed)
PROGRESS NOTE   Joanna Ali  QMG:[id number]    DOB: 1950-08-08    DOA: 09/09/2019  PCP: Renaldo Reel, DO   I have briefly reviewed patients previous medical records in [facility].  Chief Complaint:  Decline in functional and mental status with increasing drowsiness.   Brief Narrative:  69 year old female with PMH of HTN, chronic pericardial effusion, moderate aortic valve regurgitation, former smoker, alcohol abuse, had extended hospital stay 07/03/2019-08/23/2019.  Initially admitted for acute kidney injury, anasarca and fatigue, started on IV diuresis, echo showed moderate to large pericardial effusion.  S/p right VATS with pericardial window on 12/23. Progressive renal failure with intermittent hyperkalemia and uremia, hemodialysis initiated 12/31.  Dysphagia, poor oral intake and ongoing failure to thrive therefore PEG placed by IR on 1/8.  PMT consulted, suspected autoimmune process, rheumatology consulted for input and recommended starting high-dose steroids due to consideration for of scleroderma with crest syndrome.  Oncology was consulted due to question of malignancy given scleroderma diagnosis and they recommended outpatient evaluation.  Course complicated by PAF with RVR and ischemic-looking fingertips due to scleroderma/small vessel disease.  Beta-blockers changed to Cardizem due to worsening of Raynaud's phenomenon and she was started on slow steroid taper on 1/26.  Anuric requiring TTS HD.  Prednisone reduced to 50 mg daily on 2/8.  On 2/8 patient noted to be confused, hallucinating and delirious.  CT head without acute findings.  TRH was consulted, IV Solu-Medrol initiated without significant improvement in her lethargy and hence hospitalized.  Despite continued aggressive care in the hospital including consultations by nephrology, ID, ongoing dialysis, antimicrobials, she did not make sustained improvement, she rapidly declined overnight 2/11.  Dr. Hilma Favors and I extensively  discussed with patient's spouse on 2/12, explained to him that she has multiorgan failure, is gravely ill and appears to be actively dying.  He transitioned her to DNR and is contemplating taking her home with hospice.  She is now comfort care.   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  Blood cultures x2: Negative to date.  MRSA PCR negative.  Chest x-ray 2/8 suggestive of CHF.  CT head 2/8: No acute intracranial abnormality.  Abdominal ultrasound 2/8: No significant abnormality.  She clearly declined compared to couple of weeks ago.  Family reported this decline about 3 days PTA.  There was regarding infectious etiology i.e. sacral wound and HSV-1 infection. ?  Hyponatremia as cause of her mental status changes.  Also intermittently hypoglycemic but do not believe her CBGs are accurate due to peripheral vasoconstriction from Raynaud's.  Rapid worsening overnight 2/11 into this morning.  Patient is unresponsive and appears to be actively dying.  Transitioned to comfort care 2/12.  Scleroderma/rheumatoid/crest flare: Steroids were being gradually weaned down recently.  It is possible that her flare is due to taper of her steroid was too quick or another precipitating cause such as infection.  Dr. Hilma Favors discussed with Rheumatologist at [facility] and they indicated there was not much that they could offer at this late stage in her condition.  Solu-Medrol dose reduced and not completely stopped to avoid adrenal  crisis, hypotension and immediate death while spouse contemplating taking home with hospice.  Stage IV sacral decubitus  ulcer: May not demonstrate infectious signs due to immunocompromise status from autoimmune disease and steroids. CT of the abdomen and pelvis did not show osteomyelitis.  Not on antibiotics.  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  Acyclovir that had been started by ID was discontinued.  ESRD: Nephrology following for dialysis needs.  Did not tolerate dialysis on 2/11, became tachycardic after an hour and had to be taken off.  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  As discussed with nephrology, she may be having GERD/mesenteric ischemia based on abdominal tenderness, worsening hyperkalemia, metabolic acidosis given her scleroderma and Raynaud's phenomenon.  Nephrology appropriately recommended no further dialysis.  Hyponatremia: Unclear etiology.?  Volume depletion.    Hyperkalemia: Recurrent.  Now comfort care.  Anemia: Stable.  Essential hypertension: Mildly uncontrolled.  Dysphagia/s/p PEG tube: Tube feeds discontinued.  Left clear liquids in case she were to wake up for comfort purposes only  Intermittent hypoglycemia: Suspect fingerstick CBGs are inaccurate due to Raynaud's phenomenon.  Checked her random blood glucose which was 110 while fingerstick CBG was low.  As per spouse, for this reason they were checking CBGs at her earlobe in rehab, continue that.  DC all nonaggressive measures.  Body mass index is 19.21 kg/m.   Adult failure to thrive: Multifactorial as noted above.  Transition to full comfort care on 2/12.  Severe protein calorie malnutrition  Paroxysmal A. fib with RVR: Has history of same.  Brief A. fib with RVR at dialysis on 2/11.  Since then has been in sinus rhythm in the 90s-sinus tachycardia in the 110s.  Suspected melena: Noted on exam 2/11 and as per nursing report overnight.  However spouse indicates that patient has had dark stools for several weeks.  FOBT +.  She was scheduled to have EGD but canceled due to rapid decline in her status.  Left-sided  colitis: Noted on CT abdomen.  Low index of suspicion for C. difficile.  No antibiotics at this time.  Nutritional Status Nutrition Problem: Severe Malnutrition Etiology: chronic illness(ESRD on HD, scleroderma) Signs/Symptoms: moderate muscle depletion, severe muscle depletion, moderate fat depletion, severe fat depletion Interventions: MVI, Tube feeding  DVT prophylaxis: SCD Code Status: Full Family Communication: I along with Dr. Hilma Favors discussed in detail with patient spouse at bedside, updated care and answered all questions. Disposition:  . Patient came from: CIR           . Anticipated d/c place: To be determined, possibly home with hospice, waiting for spouse to decide . Barriers to d/c: Pending spouse's decision and arrangement of home hospice if that is what he chooses.  She may even demise in the hospital.   Consultants:   Nephrology PMT Infectious disease GI/Dr. Benson Norway  Procedures:   HD  Antimicrobials:   IV acyclovir   Subjective:  Overnight events noted.  Rapid response was activated for dyspnea, tachypnea, hypoxia.  This morning she looks worse than she has looked in the last 3 days that I have cared for her.  Unresponsive.  Objective:   Vitals:   08/24/2019 0445 08/30/2019 0645 09/08/2019 0829 08/28/2019 1332  BP: (!) 149/101 (!) 136/91 134/90 (!) 148/98  Pulse: (!) 103 97 98 (!) 102  Resp: (!) 22  (!) 22 (!) 22  Temp:      TempSrc:      SpO2: 96%   100%  Weight:  General exam: Elderly female, small built, frail and chronically ill looking, tachypneic and looks much worse than she did yesterday. Respiratory system: Reduced breath sounds bilaterally/harsh with occasional crackles.  No wheezing.  Increased work of breathing Cardiovascular system: S1 and S2 heard, regular tachycardia.  No JVD or murmurs.  No pedal edema.  Telemetry: Sinus tachycardia in the 100s.  No recurrence of A. fib since yesterday. Gastrointestinal system: Abdomen is minimally  distended, tender in the middle with guarding, patient moans to touch.  No rebound.  Bowel sounds present. Central nervous system: Unresponsive except to painful stimulus when she will moan. Extremities: Moving all limbs symmetrically.  Dusky discoloration of the tip of her fingers. Skin: Generally taut and shiny skin, especially over her nose and her fingers.  Blackish discolored area under the tip of her nose/philtrum.  I did not see any vesicles.  Sacral wound exam without overt infection, as in picture below from 2/11. Psychiatry: Judgement and insight impaired. Mood & affect cannot be assessed.      Data Reviewed:   I have personally reviewed following labs and imaging studies   CBC: Recent Labs  Lab 08/28/19 0534 08/21/2019 1333 08/30/19 0144 08/31/19 0923 09/03/2019 0647  WBC 6.3   < > 5.5 7.7 8.0  NEUTROABS 4.7  --   --   --   --   HGB 12.0   < > 10.9* 9.3* 9.5*  HCT 41.1   < > 37.1 31.5* 31.4*  MCV 95.8   < > 96.1 97.8 95.7  PLT 148*   < > 196 219 217   < > = values in this interval not displayed.    Basic Metabolic Panel: Recent Labs  Lab 08/28/19 0542 08/21/2019 1333 08/30/19 0144 08/30/19 0144 08/30/19 2200 08/31/19 0923 09/09/2019 0647  NA 125*   < > 130*  --   --  128* 133*  K 5.0   < > 4.1  --   --  4.7 5.8*  CL 88*   < > 91*  --   --  90* 94*  CO2 20*   < > 23  --   --  15* 16*  GLUCOSE 79   < > 106*   < > 101* 161* 141*  BUN 81*   < > 51*  --   --  101* 93*  CREATININE 2.31*   < > 1.86*  --   --  2.92* 2.55*  CALCIUM 9.2   < > 8.5*  --   --  9.1 8.8*  PHOS 2.5  --  2.7  --   --   --   --    < > = values in this interval not displayed.    Liver Function Tests: Recent Labs  Lab 08/28/19 0534 08/28/19 0534 08/28/19 0542 08/24/2019 1333 08/30/19 0144  AST 52*  --   --  32  --   ALT 25  --   --  24  --   ALKPHOS 121  --   --  99  --   BILITOT 0.7  --   --  0.9  --   PROT 5.7*  --   --  5.5*  --   ALBUMIN 2.9*   < > 2.8* 2.7* 2.7*   < > = values in  this interval not displayed.    CBG: Recent Labs  Lab 08/28/2019 0410 09/14/2019 0746 09/14/2019 1125  GLUCAP 155* 149* 147*    Microbiology Studies:   Recent Results (  from the past 240 hour(s))  Culture, blood (routine x 2)     Status: None (Preliminary result)   Collection Time: 08/27/2019  1:33 PM   Specimen: BLOOD RIGHT HAND  Result Value Ref Range Status   Specimen Description BLOOD RIGHT HAND  Final   Special Requests   Final    BOTTLES DRAWN AEROBIC ONLY Blood Culture adequate volume   Culture   Final    NO GROWTH 3 DAYS Performed at [facility], [address], [place], [place] [address]    Report Status PENDING  Incomplete  Culture, blood (routine x 2)     Status: None (Preliminary result)   Collection Time: 09/09/2019  1:45 PM   Specimen: BLOOD  Result Value Ref Range Status   Specimen Description BLOOD RIGHT ANTECUBITAL  Final   Special Requests   Final    BOTTLES DRAWN AEROBIC ONLY Blood Culture results may not be optimal due to an inadequate volume of blood received in culture bottles   Culture   Final    NO GROWTH 3 DAYS Performed at [facility], [place] [address], [place], [place] [address]    Report Status PENDING  Incomplete     Radiology Studies:  DG Chest Port 1 View  Result Date: 09/03/2019 CLINICAL DATA:  Dyspnea EXAM: PORTABLE CHEST 1 VIEW COMPARISON:  08/28/2019 chest radiograph. FINDINGS: Stable configuration of left internal jugular central venous catheter terminating over the right atrium. Stable cardiomediastinal silhouette with moderate cardiomegaly. No pneumothorax. Probable small bilateral pleural effusions, slightly increased on the left. Worsening diffuse patchy and linear parahilar interstitial opacities. Worsening aeration at the left lung base. IMPRESSION: 1. Stable moderate cardiomegaly. Worsening diffuse patchy and linear parahilar interstitial opacities, favor worsening cardiogenic pulmonary edema. 2. Probable small bilateral pleural  effusions, slightly increased on the left. 3. Worsening aeration at the left lung base, favor atelectasis. Electronically Signed   By: Ilona Sorrel M.D.   On: 09/10/2019 08:09     Scheduled Meds:   . Chlorhexidine Gluconate Cloth  6 each Topical Daily  . HYDROmorphone      . [START ON 10-01-2019] methylPREDNISolone (SOLU-MEDROL) injection  80 mg Intravenous Q24H  . pantoprazole (PROTONIX) IV  40 mg Intravenous Q12H    Continuous Infusions:      LOS: 3 days     Vernell Leep, MD, [place], [facility]    To contact the attending provider between 7A-7P or the covering provider during after hours 7P-7A, please log into the web site [email or web address] and access using universal [place] password for that web site. If you do not have the password, please call the hospital operator.  09/06/2019, 2:15 PM

## 2019-09-01 NOTE — Progress Notes (Signed)
Subjective: Somnolent   Antibiotics:  Anti-infectives (From admission, onward)   Start     Dose/Rate Route Frequency Ordered Stop   08/30/19 2200  valACYclovir (VALTREX) compounded oral suspension 500 mg  Status:  Discontinued     500 mg Per Tube 2 times daily 08/30/19 1348 08/30/19 1542   08/30/19 1700  acyclovir (ZOVIRAX) 215 mg in dextrose 5 % 100 mL IVPB     5 mg/kg  43 kg 104.3 mL/hr over 60 Minutes Intravenous Every 24 hours 08/30/19 1552     08/30/19 1345  valACYclovir (VALTREX) compounded oral suspension 500 mg  Status:  Discontinued     500 mg Per Tube 2 times daily 08/30/19 1344 08/30/19 1348      Medications: Scheduled Meds: . Chlorhexidine Gluconate Cloth  6 each Topical Daily  . loratadine  10 mg Per Tube Daily  . methylPREDNISolone (SOLU-MEDROL) injection  125 mg Intravenous Q8H  . multivitamin  1 tablet Oral QHS  . pantoprazole (PROTONIX) IV  40 mg Intravenous Q12H  . thiamine  100 mg Oral Daily   Continuous Infusions: . acyclovir 215 mg (08/31/19 1815)  . feeding supplement (NEPRO CARB STEADY) 1,000 mL (08/31/19 1816)   PRN Meds:.acetaminophen, albuterol, ALPRAZolam, antiseptic oral rinse, bisacodyl, camphor-menthol, ondansetron (ZOFRAN) IV, oxyCODONE    Objective: Weight change:   Intake/Output Summary (Last 24 hours) at 09/15/2019 0957 Last data filed at 08/28/2019 1224 Gross per 24 hour  Intake 60 ml  Output 194 ml  Net -134 ml   Blood pressure 134/90, pulse 98, temperature (!) 97.5 F (36.4 C), temperature source Oral, resp. rate (!) 22, weight 54 kg, SpO2 96 %. Temp:  [96.4 F (35.8 C)-98.1 F (36.7 C)] 97.5 F (36.4 C) (02/12 0000) Pulse Rate:  [92-126] 98 (02/12 0829) Resp:  [18-28] 22 (02/12 0829) BP: (134-158)/(90-105) 134/90 (02/12 0829) SpO2:  [90 %-100 %] 96 % (02/12 0445) Weight:  [54 kg] 54 kg (02/11 1311)  Physical Exam: General: somnolent Quite wasted HEENT: anicteric sclera, EOMI scabbed area of nares  visualized CVS regular rate, normal , no mgr Chest: , no wheezing, no respiratory distress, lungs fairly clear anteriorly Abdomen: soft non-distended,  Extremities: Moving wasting, Raynaud's changes  Wounds not examined today Neuro: nonfocal  CBC:    BMET Recent Labs    08/31/19 0923 08/25/2019 0647  NA 128* 133*  K 4.7 5.8*  CL 90* 94*  CO2 15* 16*  GLUCOSE 161* 141*  BUN 101* 93*  CREATININE 2.92* 2.55*  CALCIUM 9.1 8.8*     Liver Panel  Recent Labs    09/16/2019 1333 08/30/19 0144  PROT 5.5*  --   ALBUMIN 2.7* 2.7*  AST 32  --   ALT 24  --   ALKPHOS 99  --   BILITOT 0.9  --        Sedimentation Rate No results for input(s): ESRSEDRATE in the last 72 hours. C-Reactive Protein No results for input(s): CRP in the last 72 hours.  Micro Results: Recent Results (from the past 720 hour(s))  MRSA PCR Screening     Status: None   Collection Time: 08/21/19  9:45 AM   Specimen: Nasal Mucosa; Nasopharyngeal  Result Value Ref Range Status   MRSA by PCR NEGATIVE NEGATIVE Final    Comment:        The GeneXpert MRSA Assay (FDA approved for NASAL specimens only), is one component of a comprehensive MRSA colonization surveillance program. It is not  intended to diagnose MRSA infection nor to guide or monitor treatment for MRSA infections. Performed at [facility], [place] [address] [place], [place] [address]   Culture, blood (routine x 2)     Status: None (Preliminary result)   Collection Time: 08/21/2019  1:33 PM   Specimen: BLOOD RIGHT HAND  Result Value Ref Range Status   Specimen Description BLOOD RIGHT HAND  Final   Special Requests   Final    BOTTLES DRAWN AEROBIC ONLY Blood Culture adequate volume   Culture   Final    NO GROWTH 3 DAYS Performed at [facility], [place] [address] [place], [place] [address]    Report Status PENDING  Incomplete  Culture, blood (routine x 2)     Status: None (Preliminary result)   Collection Time: 08/27/2019  1:45  PM   Specimen: BLOOD  Result Value Ref Range Status   Specimen Description BLOOD RIGHT ANTECUBITAL  Final   Special Requests   Final    BOTTLES DRAWN AEROBIC ONLY Blood Culture results may not be optimal due to an inadequate volume of blood received in culture bottles   Culture   Final    NO GROWTH 3 DAYS Performed at [facility], [place] [address], [place], [place] [address]    Report Status PENDING  Incomplete    Studies/Results: CT ABDOMEN PELVIS WO CONTRAST  Result Date: 08/30/2019 CLINICAL DATA:  Failure to thrive, abdominal pain EXAM: CT ABDOMEN AND PELVIS WITHOUT CONTRAST TECHNIQUE: Multidetector CT imaging of the abdomen and pelvis was performed following the standard protocol without IV contrast. COMPARISON:  08/28/2019, 08/05/2019 FINDINGS: Lower chest: Bilateral pleural effusions and bibasilar atelectasis unchanged. Hepatobiliary: No focal liver abnormality is seen. No gallstones, gallbladder wall thickening, or biliary dilatation. Pancreas: Unremarkable. No pancreatic ductal dilatation or surrounding inflammatory changes. Spleen: Normal in size without focal abnormality. Adrenals/Urinary Tract: No urinary tract calculi or obstructive uropathy. The bladder is unremarkable. The adrenals are normal. Stomach/Bowel: Percutaneous gastrostomy tube again noted. No bowel obstruction or ileus. There is wall thickening of sigmoid colon which may be inflammatory or infectious. Vascular/Lymphatic: No significant vascular findings are present. No enlarged abdominal or pelvic lymph nodes. Reproductive: Uterus and bilateral adnexa are unremarkable. Other: There is a small amount of free fluid in the pelvis, though increased since prior study. No free gas. Diffuse body wall edema unchanged. Musculoskeletal: No acute or destructive bony lesions. Reconstructed images demonstrate no additional findings. IMPRESSION: 1. Inflammatory or infectious colitis of the sigmoid colon. 2. Bilateral pleural effusions  and bibasilar atelectasis unchanged. 3. Slight increase in pelvic free fluid. Electronically Signed   By: Randa Ngo M.D.   On: 08/30/2019 21:00   DG Chest Port 1 View  Result Date: 08/24/2019 CLINICAL DATA:  Dyspnea EXAM: PORTABLE CHEST 1 VIEW COMPARISON:  08/28/2019 chest radiograph. FINDINGS: Stable configuration of left internal jugular central venous catheter terminating over the right atrium. Stable cardiomediastinal silhouette with moderate cardiomegaly. No pneumothorax. Probable small bilateral pleural effusions, slightly increased on the left. Worsening diffuse patchy and linear parahilar interstitial opacities. Worsening aeration at the left lung base. IMPRESSION: 1. Stable moderate cardiomegaly. Worsening diffuse patchy and linear parahilar interstitial opacities, favor worsening cardiogenic pulmonary edema. 2. Probable small bilateral pleural effusions, slightly increased on the left. 3. Worsening aeration at the left lung base, favor atelectasis. Electronically Signed   By: Ilona Sorrel M.D.   On: 09/03/2019 08:09   CT MAXILLOFACIAL WO CONTRAST  Result Date: 08/30/2019 CLINICAL  DATA:  Failure to thrive EXAM: CT MAXILLOFACIAL WITHOUT CONTRAST TECHNIQUE: Multidetector CT imaging of the maxillofacial structures was performed. Multiplanar CT image reconstructions were also generated. COMPARISON:  None. FINDINGS: Osseous: No fracture or mandibular dislocation. No destructive process. Orbits: Negative. No traumatic or inflammatory finding. Sinuses: Clear Soft tissues: Negative Limited intracranial: No acute findings IMPRESSION: No evidence of facial or orbital fracture. No acute findings. Electronically Signed   By: Rolm Baptise M.D.   On: 08/30/2019 20:52      Assessment/Plan:  INTERVAL HISTORY:   Patient's encephalopathy is worsened   Principal Problem:   Failure to thrive in adult Active Problems:   Hypertension   Protein-calorie malnutrition, severe   Pressure injury of skin    Dysphagia   ESRD on hemodialysis (HCC)   Scleroderma progressive (HCC)   CREST syndrome (HCC)   Anemia of chronic disease   Stage III pressure ulcer of sacral region ([place])    Hisako Bugh is a 69 y.o. female with progressive scleroderma/crest, history of pericardial disease end-stage renal disease on hemodialysis who was on the rehab unit after improving on corticosteroids now readmitted to the medicine service after worsening encephalopathy.  Her encephalopathy reportedly improved initially with steroids and seems improved overnight as well.  I started on IV acyclovir in case she had HSV encephalitis.   CT scan of her abdomen and pelvis does not show evidence graphically for osteomyelitis in the area of her stage IV sacral decubitus ulcer.  Was an area of inflammation in the sigmoid colon which to me is nonspecific.  I do not think she has C. difficile colitis  CT maxillofacial was unrevealing.  While she initially seemed to get better the first 24 hours that I saw her this just may have been fluctuation in her current state.    I am find to continue the acyclovir in the interim  If an MRI of the brain was possible we could look for temporal lobe enhancement and if it was not found to discontinue the acyclovir but I am not sure if she can have such an exam at present?  I think that she is going to continue to have a downward projection in her course and I strongly recommend pivot to palliative care  I will back on Monday by Dr. Megan Salon is available for questions this weekend.   LOS: 3 days   Alcide Evener 09/09/2019, 9:57 AM

## 2019-09-01 NOTE — Progress Notes (Signed)
Patient ID: Mannie Wineland, female   DOB: 1951-05-26, 69 y.o.   MRN: [id number] [facility] Progress Note   Assessment/ Plan:   1.  Altered mental status/encephalopathy: She has remained encephalopathic overnight with minimal verbal responses.  Without evidence of hepatic encephalopathy or intracranial pathology and CT scan and work-up so far unyielding. On stress dose steroids but continues to decline-- appears that mortality may be imminent.  2. ESRD: With acute kidney injury that has been dialysis dependent now for the past 6 weeks or so.  Permanent dialysis access placement limited by her Raynaud's- continue hemodialysis via [facility].  I fear that she likely has gut/mesenteric ischemia based on exam and worsening hyperkalemia and metabolic acidosis-- unfortunately prognosis appears grim at this time and I would not recommend additional attempts at dialysis. 3. Anemia: Without overt blood loss, rising hemoglobin/hematocrit appears to be reflective of hemoconcentration. 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 5. Nutrition: With low albumin level and likely poor intake secondary to lethargy. 6. Hypertension: Blood pressure within acceptable range  Subjective:   Hemodialysis shortened yesterday after she developed tachycardia and hemodynamic instability. More lethargic and uncomfortable overnight.    Objective:   BP 134/90   Pulse 98   Temp (!) 97.5 F (36.4 C) (Oral)   Resp (!) 22   Wt 54 kg   SpO2 96%   BMI 19.21 kg/m   Physical Exam: Gen: Moaning and appears uncomfortable resting in bed CVS: Pulse regular rhythm, normal rate, S1 and S2 normal Resp: Clear to auscultation, no rales/rhonchi Abd: Guarding and tenderness to palpation Ext: 1+ bilateral lower extremity edema, right IJ TDC.  Labs: BMET Recent Labs  Lab 08/28/19 0542 08/23/2019 1333 08/30/19 0144 08/30/19 2200 08/31/19 0923 08/25/2019 0647  NA 125* 125* 130*  --   128* 133*  K 5.0 5.6* 4.1  --  4.7 5.8*  CL 88* 88* 91*  --  90* 94*  CO2 20* 19* 23  --  15* 16*  GLUCOSE 79 112* 106* 101* 161* 141*  BUN 81* 140* 51*  --  101* 93*  CREATININE 2.31* 3.36* 1.86*  --  2.92* 2.55*  CALCIUM 9.2 8.9 8.5*  --  9.1 8.8*  PHOS 2.5  --  2.7  --   --   --    CBC Recent Labs  Lab 08/28/19 0534 08/28/19 0534 08/31/2019 1333 08/30/19 0144 08/31/19 0923 08/21/2019 0647  WBC 6.3   < > 6.2 5.5 7.7 8.0  NEUTROABS 4.7  --   --   --   --   --   HGB 12.0   < > 10.1* 10.9* 9.3* 9.5*  HCT 41.1   < > 33.6* 37.1 31.5* 31.4*  MCV 95.8   < > 93.6 96.1 97.8 95.7  PLT 148*   < > 216 196 219 217   < > = values in this interval not displayed.      Medications:    . Chlorhexidine Gluconate Cloth  6 each Topical Daily  . loratadine  10 mg Per Tube Daily  . methylPREDNISolone (SOLU-MEDROL) injection  125 mg Intravenous Q8H  . multivitamin  1 tablet Oral QHS  . pantoprazole (PROTONIX) IV  40 mg Intravenous Q12H  . thiamine  100 mg Oral Daily   Elmarie Shiley, MD 08/28/2019, 8:55 AM

## 2019-09-02 DIAGNOSIS — Z515 Encounter for palliative care: Secondary | ICD-10-CM

## 2019-09-02 MED ORDER — HYDROMORPHONE HCL 1 MG/ML IJ SOLN: 0.5000 mg | INTRAMUSCULAR | Status: DC | PRN

## 2019-09-02 MED ORDER — GLYCOPYRROLATE 0.2 MG/ML IJ SOLN: 0.2000 mg | INTRAMUSCULAR | Status: DC | PRN

## 2019-09-02 MED ORDER — LORAZEPAM 1 MG PO TABS: 1.0000 mg | ORAL_TABLET | ORAL | Status: DC | PRN

## 2019-09-02 MED ORDER — HALOPERIDOL LACTATE 2 MG/ML PO CONC
0.5000 mg | ORAL | Status: DC | PRN
  Filled 2019-09-02: qty 0.3

## 2019-09-02 MED ORDER — BIOTENE DRY MOUTH MT LIQD: 15.0000 mL | OROMUCOSAL | Status: DC | PRN

## 2019-09-02 MED ORDER — HALOPERIDOL 0.5 MG PO TABS
0.5000 mg | ORAL_TABLET | ORAL | Status: DC | PRN
  Filled 2019-09-02: qty 1

## 2019-09-02 MED ORDER — HALOPERIDOL LACTATE 5 MG/ML IJ SOLN: 0.5000 mg | INTRAMUSCULAR | Status: DC | PRN

## 2019-09-02 MED ORDER — LORAZEPAM 2 MG/ML IJ SOLN: 1.0000 mg | INTRAMUSCULAR | Status: DC | PRN

## 2019-09-02 MED ORDER — CHLORHEXIDINE GLUCONATE 0.12 % MT SOLN
15.0000 mL | Freq: Two times a day (BID) | OROMUCOSAL | Status: DC
  Administered 2019-09-02: 12:00:00 15 mL via OROMUCOSAL
  Filled 2019-09-02: qty 15

## 2019-09-02 MED ORDER — GLYCOPYRROLATE 1 MG PO TABS
1.0000 mg | ORAL_TABLET | ORAL | Status: DC | PRN
  Filled 2019-09-02: qty 1

## 2019-09-02 MED ORDER — POLYVINYL ALCOHOL 1.4 % OP SOLN
1.0000 [drp] | Freq: Four times a day (QID) | OPHTHALMIC | Status: DC | PRN
  Filled 2019-09-02: qty 15

## 2019-09-02 MED ORDER — LORAZEPAM 2 MG/ML PO CONC: 1.0000 mg | ORAL | Status: DC | PRN

## 2019-09-02 MED ORDER — ORAL CARE MOUTH RINSE: 15.0000 mL | Freq: Two times a day (BID) | OROMUCOSAL | Status: DC

## 2019-09-02 MED ORDER — LORAZEPAM 2 MG/ML IJ SOLN: 0.5000 mg | INTRAMUSCULAR | Status: DC | PRN

## 2019-09-03 LAB — CULTURE, BLOOD (ROUTINE X 2)
Culture: NO GROWTH
Culture: NO GROWTH
Special Requests: ADEQUATE

## 2019-09-18 NOTE — Progress Notes (Signed)
Progress Note  I visited patient this morning with spouse at bedside.  Patient looked worse today than she did yesterday.  As per spouse, she squeezed his fingers a couple of times but nonverbal.  To me she seemed to partially open her eyes but unsure if she was tracking activity or just staring blankly.  She was spontaneously moving her left upper extremity.  Did not appear in pain and appeared comfortable.  Mouth breathing.  Overall death seemed imminent when I saw her this morning.  I discussed with spouse at bedside.  He was awaiting family from out of town to come visit her.  He was yet undecided regarding taking her home with home hospice due to risk that she may die on route.  I comforted him.  Discussed with RN outside patient's room.  Vernell Leep, MD, [place], [facility]. [facility]  To contact the attending provider between 7A-7P or the covering provider during after hours 7P-7A, please log into the web site [email or web address] and access using universal [place] password for that web site. If you do not have the password, please call the hospital operator.

## 2019-09-18 NOTE — Progress Notes (Signed)
Patient ID: Joanna Ali, female   DOB: [birth year], 69 y.o.   MRN: [id number] Patient now on comfort/palliative care track with intention to possibly send her home with hospice given imminent mortality- I appreciate Drs. Hongalgi and [place] with facilitating this. No additional dialysis with continued clinical decline.  I discussed with her husband briefly and offered emotional support.  Please call with questions/concerns.   Elmarie Shiley MD [facility]. Office # [phone] Pager # [phone] 10:20 AM

## 2019-09-18 NOTE — Progress Notes (Signed)
Daily Progress Note   [name]       Date: 2019-09-17 DOB: 12-16-1950  Age: 69 y.o. MRN#: [id number] Attending Physician: Modena Jansky, MD Primary Care Physician: Renaldo Reel, DO Admit Date: 08/30/2019  Reason for Consultation/Follow-up: Establishing goals of care and Psychosocial/spiritual support  Discussed with Dr. Hilma Favors this am. Spoke with husband on the phone arranged PMT meeting.  Discussed with nursing and security to allow family in.  Subjective: Met with husband at bedside.  Discussed her current status.  Dicussed comfort meds.  His focus is her comfort.  He is hoping family will arrive soon.  I explained that her time is very short.  He expressed understanding.  Family arrived.  I greeting them and jointly answered questions about scleroderma with patient's husband Joneen Caraway.  Shortly after family arrived.  Patient's respirations ceased.  I went to find the RN so that we could pronounce together.  Assessment: Patient expired while family and I were present.   Length of Stay: 4  Current Medications: Scheduled Meds:  . chlorhexidine  15 mL Mouth Rinse BID  . Chlorhexidine Gluconate Cloth  6 each Topical Daily  . mouth rinse  15 mL Mouth Rinse q12n4p  . pantoprazole (PROTONIX) IV  40 mg Intravenous Q12H    Continuous Infusions:   PRN Meds: albuterol, antiseptic oral rinse, antiseptic oral rinse, camphor-menthol, glycopyrrolate **OR** glycopyrrolate **OR** glycopyrrolate, haloperidol **OR** haloperidol **OR** haloperidol lactate, HYDROmorphone (DILAUDID) injection, [DISCONTINUED] LORazepam **OR** [DISCONTINUED] LORazepam **OR** LORazepam, ondansetron (ZOFRAN) IV, polyvinyl alcohol  Physical Exam       Thin chronically ill appearing female,  agonal breathing, cyanotic fingers.  Vital Signs: BP (!) 133/91 (BP Location: Right Arm)   Pulse (!) 102   Temp (!) 97.3 F (36.3 C) (Axillary)   Resp 17   Wt 54 kg   SpO2 94%   BMI 19.21 kg/m  SpO2: SpO2: 94 % O2 Device: O2 Device: Nasal Cannula O2 Flow Rate: O2 Flow Rate (L/min): 4 L/min  Intake/output summary:   Intake/Output Summary (Last 24 hours) at 2019-09-17 1256 Last data filed at 09/17/2019 1005 Gross per 24 hour  Intake 0 ml  Output 0 ml  Net 0 ml   LBM: Last BM Date: 08/31/19 Baseline Weight: Weight: 53 kg Most recent weight: Weight: 54 kg  Palliative Assessment/Data: 0%      Patient Active Problem List   Diagnosis Date Noted  . Heme positive stool 08/28/2019  . Stage III pressure ulcer of sacral region ([place]) 08/24/2019  . Debility   . S/P pericardial window creation   . Thrombocytopenia ([place])   . Anemia of chronic disease   . 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 Rheumatoid arthritis ([place]) 08/15/2019  . 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 Atrial fibrillation with RVR ([place])   . AKI (acute kidney injury) ([place])   . Dyspnea   . S/P thoracentesis   . ESRD on hemodialysis ([place]) 07/31/2019  . Dysphagia   . Muscular weakness   . Thrush of mouth and esophagus ([place])   . Failure to thrive in adult   . Goals of care, counseling/discussion   . Palliative care by specialist   . Pressure injury of skin 07/19/2019  . Pleural effusion   . Protein-calorie malnutrition, severe 07/05/2019  . Elevated troponin 07/04/2019  . Atypical pneumonia 07/04/2019  . Hypoxia 07/04/2019  . Pericardial effusion 04/07/2019  . Hypertension 04/07/2019  . Insomnia 04/07/2019  . Monoclonal paraproteinemia   . Anasarca 04/04/2019    Palliative Care Plan    Recommendations/Plan:  Funeral home obtained from husband.  Appreciate Chaplain support.  Discharge Planning:   Hospital Death.    Care plan was discussed with RN and family.  Thank you for allowing  the Palliative Medicine Team to assist in the care of this patient.  Total time spent:  35 min.     Greater than 50%  of this time was spent counseling and coordinating care related to the above assessment and plan.  Florentina Jenny, PA-C Palliative Medicine  Please contact Palliative MedicineTeam phone at [phone] for questions and concerns between 7 am - 7 pm.   Please see AMION for individual provider pager numbers.

## 2019-09-18 NOTE — Progress Notes (Signed)
   2019-09-04 1357  Attending [place]  Attending Physician Notified Y  Attending Physician (First and Last Name) Dr. Arlyn Leak  Will the above attending physician sign death certificate? Yes  Post Mortem Checklist  Date of Death 09/04/2019  Time of Death 89  Pronounced By maryann dillinger, thess estrera  Next of kin notified Yes  Name of next of kin notified of death russell Dedrick  Contact Person's Relationship to Patient Spouse  Contact Person's Phone Number [phone]  Was the patient a No Code Blue or a Limited Code Blue? Yes  Did the patient die unattended? No  Patient restrained? Not applicable  [place] [facility]  Notification Date September 04, 2019  Notification Time [place] [facility] Number [phone]  Is patient a potential donor? Y  Donation Type Eyes  Eye prep completed Yes

## 2019-09-18 NOTE — Progress Notes (Signed)
Prayed with family at bedside at end of life.  Rev. [place].

## 2019-09-18 NOTE — Death Summary Note (Addendum)
DEATH SUMMARY   Patient Details  Name: Joanna Ali MRN: [id number] DOB: [birth year]  Admission/Discharge Information   Admit Date:  19-Sep-2019  Date of Death: Date of Death: (P) 09-23-2019  Time of Death: Time of Death: (P) 60  Length of Stay: 4  Referring Physician: Renaldo Reel, DO   Reason(s) for Hospitalization    Diagnoses  Preliminary cause of death: End-stage renal disease with uremia Secondary Diagnoses (including complications and co-morbidities):  Principal Problem:   Failure to thrive in adult Active Problems:   Hypertension   Protein-calorie malnutrition, severe   Pressure injury of skin   Dysphagia   ESRD on hemodialysis ([place])   Scleroderma progressive (HCC)   CREST syndrome ([place])   Anemia of chronic disease   Stage III pressure ulcer of sacral region [facility])   Palliative care encounter   Brief Hospital Course (including significant findings, care, treatment, and services provided and events leading to death)  69 year old female with PMH of HTN, chronic pericardial effusion, moderate aortic valve regurgitation, former smoker, alcohol abuse, had extended hospital stay 07/03/2019-08/23/2019.  Initially admitted for acute kidney injury, anasarca and fatigue, started on IV diuresis, echo showed moderate to large pericardial effusion.  S/p right VATS with pericardial window on 12/23. Progressive renal failure with intermittent hyperkalemia and uremia, hemodialysis initiated 12/31.  Dysphagia, poor oral intake and ongoing failure to thrive therefore PEG placed by IR on 1/8.  PMT consulted, suspected autoimmune process, rheumatology consulted for input and recommended starting high-dose steroids due to consideration for of scleroderma with crest syndrome.  Oncology was consulted due to question of malignancy given scleroderma diagnosis and they recommended outpatient evaluation.  Course complicated by PAF with RVR and ischemic-looking fingertips due to scleroderma/small  vessel disease.  Beta-blockers changed to Cardizem due to worsening of Raynaud's phenomenon and she was started on slow steroid taper on 1/26.  Anuric requiring TTS HD.  Prednisone reduced to 50 mg daily on 2/8.  On 2/8 patient noted to be confused, hallucinating and delirious.  CT head without acute findings.  TRH was consulted, IV Solu-Medrol initiated without significant improvement in her lethargy and hence hospitalized.  Despite continued aggressive care in the hospital including consultations by nephrology, ID, ongoing dialysis, antimicrobials, she did not make sustained improvement, she rapidly declined overnight 2/11.  She developed multiorgan failure.  Her overall prognosis was grave.  After extensive discussions with spouse coordinated by attending MD and PMT, she was transitioned to full comfort care and eventually demised today.  Spouse declined autopsy.  Following of the details of her latest hospitalization:   Assessment & Plan:   Acute metabolic encephalopathy: Unclear etiology.  Blood cultures x2: Negative to date.  MRSA PCR negative.  Chest x-ray 2/8 suggestive of CHF.  CT head 2/8: No acute intracranial abnormality.  Abdominal ultrasound 2/8: No significant abnormality.  She clearly declined compared to couple of weeks ago.  Family reported this decline about 3 days PTA.  There was regarding infectious etiology i.e. sacral wound and HSV-1 infection. ?  Hyponatremia as cause of her mental status changes.  Also intermittently hypoglycemic but do not believe her CBGs are accurate due to peripheral vasoconstriction from Raynaud's.    Rapidly worsened 2 nights ago.  Patient transitioned to full comfort care yesterday and demised today.  Scleroderma/rheumatoid/crest flare: Steroids were being gradually weaned down recently.  It is possible that her flare is due to taper of her steroid was too quick or another precipitating cause such as infection.  Dr. Hilma Favors discussed with Rheumatologist at  [facility] and they indicated there was not much that they could offer at this late stage in her condition.  Patient remained on IV Solu-Medrol during current hospitalization  Stage IV sacral decubitus ulcer: May not demonstrate infectious signs due to immunocompromise status from autoimmune disease and steroids. CT of the abdomen and pelvis did not show osteomyelitis.  Not on antibiotics.  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  Acyclovir that had been started by ID was discontinued.  ESRD: Nephrology following for dialysis needs.  Did not tolerate dialysis on 2/11, became tachycardic after an hour and had to be taken off.  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  As discussed with nephrology, she may be having GERD/mesenteric ischemia based on abdominal tenderness, worsening hyperkalemia, metabolic acidosis given her scleroderma and Raynaud's phenomenon.  Nephrology appropriately recommended no further dialysis.  Hyponatremia: Unclear etiology.?  Volume depletion.    Hyperkalemia: Recurrent.    Anemia:   Essential hypertension:   Dysphagia/s/p PEG tube: Tube feeds discontinued at time of comfort care initiation.   Intermittent hypoglycemia: Suspect fingerstick CBGs were inaccurate due to Raynaud's phenomenon.  Checked her random blood glucose which was 110 while fingerstick CBG was low.  As per spouse, for this reason they were checking CBGs at her earlobe in rehab, continue that.  DC all nonaggressive measures.  Body mass index is 19.21 kg/m.   Adult failure to thrive: Multifactorial as noted above.  Transitioned to full comfort care on 2/12.  Severe protein calorie malnutrition  Paroxysmal A. fib with RVR: Has history of same.  Brief A. fib with RVR at dialysis on 2/11.    Suspected melena: Noted on exam 2/11 and as per nursing report overnight.  However spouse indicates that patient has had dark stools for several weeks.  FOBT +.  She was  scheduled to have EGD but canceled due to rapid decline in her status.  Left-sided colitis: Noted on CT abdomen.  Low index of suspicion for C. difficile.  No antibiotics at this time.  Nutritional Status Nutrition Problem: Severe Malnutrition Etiology: chronic illness(ESRD on HD, scleroderma) Signs/Symptoms: moderate muscle depletion, severe muscle depletion, moderate fat depletion, severe fat depletion Interventions: MVI, Tube feeding    Consultants:   Nephrology PMT Infectious disease GI/Dr. Benson Norway  Procedures:   HD     Pertinent Labs and Studies  Significant Diagnostic Studies CT ABDOMEN PELVIS WO CONTRAST  Result Date: 08/30/2019 CLINICAL DATA:  Failure to thrive, abdominal pain EXAM: CT ABDOMEN AND PELVIS WITHOUT CONTRAST TECHNIQUE: Multidetector CT imaging of the abdomen and pelvis was performed following the standard protocol without IV contrast. COMPARISON:  08/28/2019, 08/05/2019 FINDINGS: Lower chest: Bilateral pleural effusions and bibasilar atelectasis unchanged. Hepatobiliary: No focal liver abnormality is seen. No gallstones, gallbladder wall thickening, or biliary dilatation. Pancreas: Unremarkable. No pancreatic ductal dilatation or surrounding inflammatory changes. Spleen: Normal in size without focal abnormality. Adrenals/Urinary Tract: No urinary tract calculi or obstructive uropathy. The bladder is unremarkable. The adrenals are normal. Stomach/Bowel: Percutaneous gastrostomy tube again noted. No bowel obstruction or ileus. There is wall thickening of sigmoid colon which may be inflammatory or infectious. Vascular/Lymphatic: No significant vascular findings are present. No enlarged abdominal or pelvic lymph nodes. Reproductive: Uterus and bilateral adnexa are unremarkable. Other: There is a small amount of free fluid in the pelvis, though increased since prior study. No free gas. Diffuse body wall edema unchanged. Musculoskeletal: No acute or destructive bony  lesions. Reconstructed images  demonstrate no additional findings. IMPRESSION: 1. Inflammatory or infectious colitis of the sigmoid colon. 2. Bilateral pleural effusions and bibasilar atelectasis unchanged. 3. Slight increase in pelvic free fluid. Electronically Signed   By: Randa Ngo M.D.   On: 08/30/2019 21:00   CT ABDOMEN PELVIS WO CONTRAST  Result Date: 08/05/2019 CLINICAL DATA:  Acute upper abdominal pain. EXAM: CT ABDOMEN AND PELVIS WITHOUT CONTRAST TECHNIQUE: Multidetector CT imaging of the abdomen and pelvis was performed following the standard protocol without IV contrast. COMPARISON:  None. FINDINGS: Lower chest: Bilateral pleural effusions are noted with adjacent subsegmental atelectasis, right greater than left. Hepatobiliary: No focal liver abnormality is seen. No gallstones, gallbladder wall thickening, or biliary dilatation. Pancreas: Unremarkable. No pancreatic ductal dilatation or surrounding inflammatory changes. Spleen: Normal in size without focal abnormality. Adrenals/Urinary Tract: Adrenal glands are unremarkable. Kidneys are normal, without renal calculi, focal lesion, or hydronephrosis. Bladder is unremarkable. Stomach/Bowel: Gastrostomy tube is well positioned within gastric lumen. Status post appendectomy. There is no evidence of bowel obstruction or inflammation. Vascular/Lymphatic: No significant vascular findings are present. No enlarged abdominal or pelvic lymph nodes. Reproductive: Uterus and bilateral adnexa are unremarkable. Other: No hernia is noted. Small amount of free fluid is noted in the posterior portion of the pelvis. Musculoskeletal: No acute or significant osseous findings. IMPRESSION: 1. Bilateral pleural effusions are noted with adjacent subsegmental atelectasis, right greater than left. 2. Gastrostomy tube is well positioned within gastric lumen. 3. Small amount of free fluid is noted in the posterior portion of the pelvis. 4. No other significant abnormality  seen in the abdomen or pelvis. Electronically Signed   By: Marijo Conception M.D.   On: 08/05/2019 12:16   CT HEAD WO CONTRAST  Result Date: 08/28/2019 CLINICAL DATA:  Altered mental status (AMS), unclear cause. EXAM: CT HEAD WITHOUT CONTRAST TECHNIQUE: Contiguous axial images were obtained from the base of the skull through the vertex without intravenous contrast. COMPARISON:  Head CT 08/02/2019 FINDINGS: Brain: No evidence of acute intracranial hemorrhage. No demarcated cortical infarction. No evidence of intracranial mass. No midline shift or extra-axial fluid collection. Mild ill-defined hypoattenuation within the cerebral white matter is nonspecific, but consistent with chronic small vessel ischemic disease. A small chronic lacunar infarct is questioned within the left basal ganglia, unchanged from prior exam (series 3, image 18). Mild generalized parenchymal atrophy. Vascular: No hyperdense vessel.  Atherosclerotic calcifications. Skull: Normal. Negative for fracture or focal lesion. Sinuses/Orbits: Visualized orbits demonstrate no acute abnormality. No significant paranasal sinus disease or mastoid effusion at the imaged levels. IMPRESSION: No evidence of acute intracranial abnormality. Stable, mild generalized parenchymal atrophy and chronic small vessel ischemic disease. A small chronic lacunar infarct is questioned within the left basal ganglia, unchanged. Electronically Signed   By: Kellie Simmering DO   On: 08/28/2019 14:37   CT CHEST WO CONTRAST  Result Date: 08/08/2019 CLINICAL DATA:  Evaluate for malignancy. EXAM: CT CHEST WITHOUT CONTRAST TECHNIQUE: Multidetector CT imaging of the chest was performed following the standard protocol without IV contrast. COMPARISON:  07/03/2019. Bilateral areas of interlobular septal thickening also noted suggesting interstitial edema. Small solid nodule within the superior segment of left lower lobe is nonspecific measuring 4 mm. FINDINGS: Cardiovascular: There is  cardiac enlargement. No pericardial effusion. Aortic atherosclerosis increase caliber of the ascending thoracic aorta measures 4.1 cm, image 69/3.The main pulmonary artery measures 3.5 cm. Prominent bilateral pulmonary arteries noted Mediastinum/Nodes: Normal appearance of the thyroid gland. The trachea appears patent and is midline. Normal appearance  of the esophagus. No enlarged mediastinal nodes. The hilar structures are suboptimally evaluated due to lack of IV contrast material. Lungs/Pleura: Moderate bilateral pleural effusions are identified. Multifocal bilateral patchy areas of upper lobe ground-glass attenuation are noted. This is similar to 07/03/2019. Upper Abdomen: No acute abnormality. Musculoskeletal: No chest wall mass or suspicious bone lesions identified. IMPRESSION: 1. There is cardiac enlargement, bilateral pleural effusions, and interlobular septal thickening identified compatible with CHF. 2. Multifocal patchy areas of ground-glass attenuation noted in both lungs. A similar finding was seen on study from 07/03/2019. Although nonspecific findings are concerning for multifocal atypical infection/inflammation. 3. Increased caliber of the main pulmonary artery suggestive of PA hypertension. 4. Ascending thoracic aortic aneurysm identified. Recommend annual imaging followup by CTA or MRA. This recommendation follows 2010 ACCF/AHA/AATS/ACR/ASA/SCA/SCAI/SIR/STS/SVM Guidelines for the Diagnosis and Management of Patients with Thoracic Aortic Disease. Circulation. 2010; 121: W119-J478. Aortic aneurysm NOS (ICD10-I71.9) 5.  Aortic Atherosclerosis (ICD10-I70.0). Electronically Signed   By: Kerby Moors M.D.   On: 08/08/2019 15:21   US Abdomen Complete  Result Date: 08/28/2019 CLINICAL DATA:  Abdominal pain. EXAM: ABDOMEN ULTRASOUND COMPLETE COMPARISON:  August 05, 2019. FINDINGS: Gallbladder: No gallstones or wall thickening visualized. No sonographic Murphy sign noted by sonographer. Sludge is noted  within gallbladder lumen. Common bile duct: Diameter: 4 mm which is within normal limits. Liver: No focal lesion identified. Within normal limits in parenchymal echogenicity. Portal vein is patent on color Doppler imaging with normal direction of blood flow towards the liver. IVC: No abnormality visualized. Pancreas: Visualized portion unremarkable. Spleen: Size and appearance within normal limits. Right Kidney: Length: 9.1 cm. Echogenicity within normal limits. No mass or hydronephrosis visualized. Left Kidney: Length: 8.8 cm. Echogenicity within normal limits. No mass or hydronephrosis visualized. Abdominal aorta: No aneurysm visualized. Other findings: None. IMPRESSION: No significant abnormality seen in the abdomen. Electronically Signed   By: Marijo Conception M.D.   On: 08/28/2019 15:08   DG Chest Port 1 View  Result Date: 08/21/2019 CLINICAL DATA:  Dyspnea EXAM: PORTABLE CHEST 1 VIEW COMPARISON:  08/28/2019 chest radiograph. FINDINGS: Stable configuration of left internal jugular central venous catheter terminating over the right atrium. Stable cardiomediastinal silhouette with moderate cardiomegaly. No pneumothorax. Probable small bilateral pleural effusions, slightly increased on the left. Worsening diffuse patchy and linear parahilar interstitial opacities. Worsening aeration at the left lung base. IMPRESSION: 1. Stable moderate cardiomegaly. Worsening diffuse patchy and linear parahilar interstitial opacities, favor worsening cardiogenic pulmonary edema. 2. Probable small bilateral pleural effusions, slightly increased on the left. 3. Worsening aeration at the left lung base, favor atelectasis. Electronically Signed   By: Ilona Sorrel M.D.   On: 09/16/2019 08:09   DG CHEST PORT 1 VIEW  Result Date: 08/28/2019 CLINICAL DATA:  Post dialysis. Evaluate for fluid overload prior to fluid administration. EXAM: PORTABLE CHEST 1 VIEW COMPARISON:  CT chest 08/08/2019 and chest radiograph 08/06/2019. FINDINGS:  Patient is rotated. Trachea is midline. Heart is enlarged. Left IJ dialysis catheter is in the right atrium. Mild diffuse mixed interstitial and airspace opacification. Small left pleural effusion. Trace right pleural fluid. Biapical pleural thickening. IMPRESSION: Congestive heart failure. Electronically Signed   By: Lorin Picket M.D.   On: 08/28/2019 13:45   DG Chest Port 1 View  Result Date: 08/06/2019 CLINICAL DATA:  Pleural effusion. EXAM: PORTABLE CHEST 1 VIEW COMPARISON:  August 01, 2019 FINDINGS: Unchanged dialysis catheter. Persistent enlargement of the cardiac silhouette. Bilateral pleural effusions. Bilateral lower lobe atelectasis versus peribronchial airspace consolidation. Osseous  structures are without acute abnormality. Soft tissues are grossly normal. IMPRESSION: 1. Persistent enlargement of the cardiac silhouette. 2. Bilateral pleural effusions. 3. Bilateral lower lobe atelectasis versus peribronchial airspace consolidation. Electronically Signed   By: Fidela Salisbury M.D.   On: 08/06/2019 15:50   DG Abd Portable 1V  Result Date: 08/04/2019 CLINICAL DATA:  Abdominal pain with tube feeding. EXAM: PORTABLE ABDOMEN - 1 VIEW COMPARISON:  Radiographs dated 07/28/2019 and 07/27/2019 FINDINGS: The gastrostomy tube is in the body of the stomach, unchanged in position since the prior study of 07/28/2019. The bowel gas pattern is normal. No bone abnormality. IMPRESSION: Benign-appearing abdomen. The gastrostomy tube is in the body of the stomach. Electronically Signed   By: Lorriane Shire M.D.   On: 08/04/2019 13:16   CT MAXILLOFACIAL WO CONTRAST  Result Date: 08/30/2019 CLINICAL DATA:  Failure to thrive EXAM: CT MAXILLOFACIAL WITHOUT CONTRAST TECHNIQUE: Multidetector CT imaging of the maxillofacial structures was performed. Multiplanar CT image reconstructions were also generated. COMPARISON:  None. FINDINGS: Osseous: No fracture or mandibular dislocation. No destructive process. Orbits:  Negative. No traumatic or inflammatory finding. Sinuses: Clear Soft tissues: Negative Limited intracranial: No acute findings IMPRESSION: No evidence of facial or orbital fracture. No acute findings. Electronically Signed   By: Rolm Baptise M.D.   On: 08/30/2019 20:52    Microbiology Recent Results (from the past 240 hour(s))  Culture, blood (routine x 2)     Status: None (Preliminary result)   Collection Time: 09/11/2019  1:33 PM   Specimen: BLOOD RIGHT HAND  Result Value Ref Range Status   Specimen Description BLOOD RIGHT HAND  Final   Special Requests   Final    BOTTLES DRAWN AEROBIC ONLY Blood Culture adequate volume   Culture   Final    NO GROWTH 3 DAYS Performed at [facility], [place] [address], [place], [place] [address]    Report Status PENDING  Incomplete  Culture, blood (routine x 2)     Status: None (Preliminary result)   Collection Time: 08/21/2019  1:45 PM   Specimen: BLOOD  Result Value Ref Range Status   Specimen Description BLOOD RIGHT ANTECUBITAL  Final   Special Requests   Final    BOTTLES DRAWN AEROBIC ONLY Blood Culture results may not be optimal due to an inadequate volume of blood received in culture bottles   Culture   Final    NO GROWTH 3 DAYS Performed at [facility], [place] [address], [place], [place] [address]    Report Status PENDING  Incomplete    Lab Basic Metabolic Panel: Recent Labs  Lab 08/28/19 0542 08/28/19 0542 09/16/2019 1333 08/30/19 0144 08/30/19 2200 08/31/19 0923 08/24/2019 0647  NA 125*  --  125* 130*  --  128* 133*  K 5.0  --  5.6* 4.1  --  4.7 5.8*  CL 88*  --  88* 91*  --  90* 94*  CO2 20*  --  19* 23  --  15* 16*  GLUCOSE 79   < > 112* 106* 101* 161* 141*  BUN 81*  --  140* 51*  --  101* 93*  CREATININE 2.31*  --  3.36* 1.86*  --  2.92* 2.55*  CALCIUM 9.2  --  8.9 8.5*  --  9.1 8.8*  PHOS 2.5  --   --  2.7  --   --   --    < > = values in this interval not displayed.   Liver  Function Tests: Recent Labs  Lab  08/28/19 0534 08/28/19 0542 08/25/2019 1333 08/30/19 0144  AST 52*  --  32  --   ALT 25  --  24  --   ALKPHOS 121  --  99  --   BILITOT 0.7  --  0.9  --   PROT 5.7*  --  5.5*  --   ALBUMIN 2.9* 2.8* 2.7* 2.7*   Recent Labs  Lab 08/23/2019 1333  LIPASE 31   Recent Labs  Lab 08/28/19 1238  AMMONIA 33   CBC: Recent Labs  Lab 08/28/19 0534 08/31/2019 1333 08/30/19 0144 08/31/19 0923 09/07/2019 0647  WBC 6.3 6.2 5.5 7.7 8.0  NEUTROABS 4.7  --   --   --   --   HGB 12.0 10.1* 10.9* 9.3* 9.5*  HCT 41.1 33.6* 37.1 31.5* 31.4*  MCV 95.8 93.6 96.1 97.8 95.7  PLT 148* 216 196 219 217   Cardiac Enzymes: No results for input(s): CKTOTAL, CKMB, CKMBINDEX, TROPONINI in the last 168 hours. Sepsis Labs: Recent Labs  Lab 08/28/19 0534 08/28/19 1238 08/28/19 1250 08/28/19 1600 08/28/19 1850 08/25/2019 1333 08/30/19 0144 08/31/19 0923 08/27/2019 0647  PROCALCITON  --  2.45  --   --   --   --   --   --   --   WBC   < >  --   --   --   --  6.2 5.5 7.7 8.0  LATICACIDVEN  --   --  2.1* 2.2* 3.9* 1.7  --   --   --    < > = values in this interval not displayed.    Procedures/Operations     [employer] 09/04/2019, 1:40 PM

## 2019-09-18 DEATH — deceased

## 2021-06-03 IMAGING — US US RENAL
1 series · 14 of 25 positions shown · non-contrast
Comparison: None.

CLINICAL DATA: Acute renal injury.

EXAM:
RENAL / URINARY TRACT ULTRASOUND COMPLETE

[Series 1: us renal · 14 of 28 slices shown]
[im 1/28]
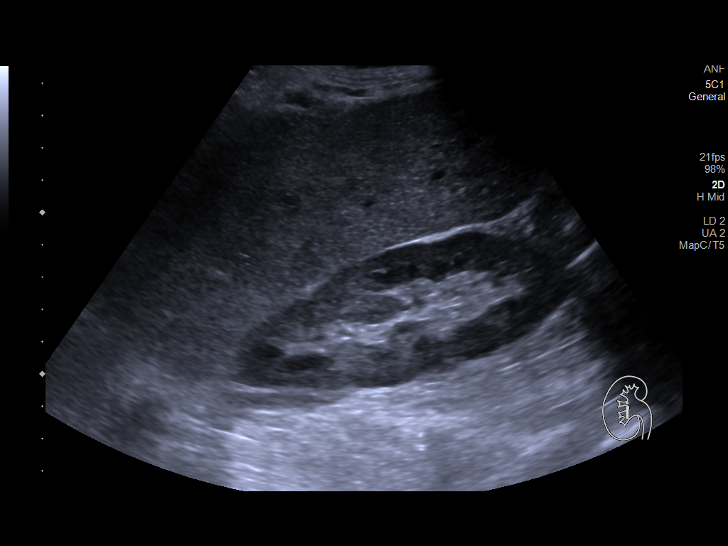
[im 3/28]
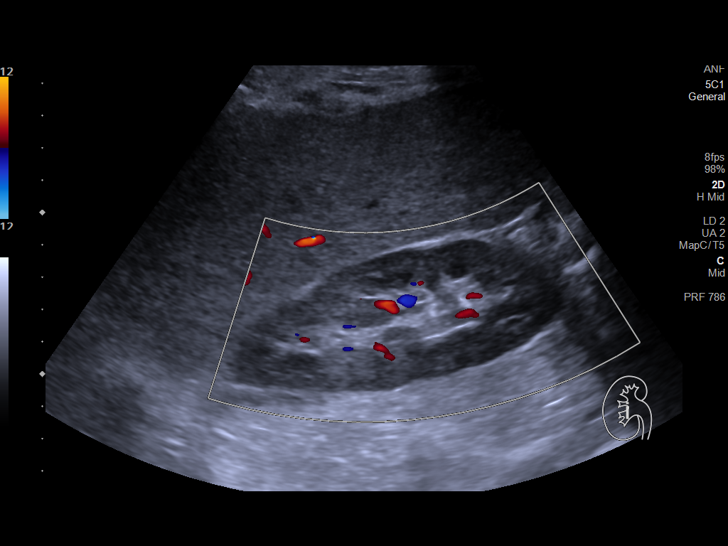
[im 5/28]
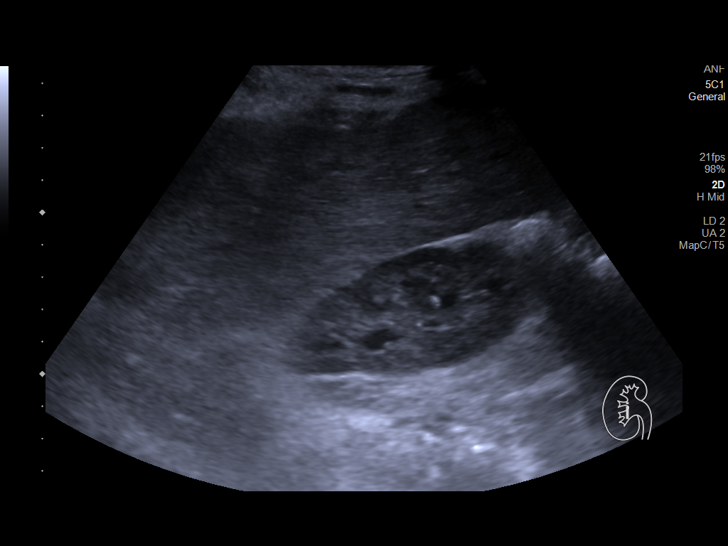
[im 7/28]
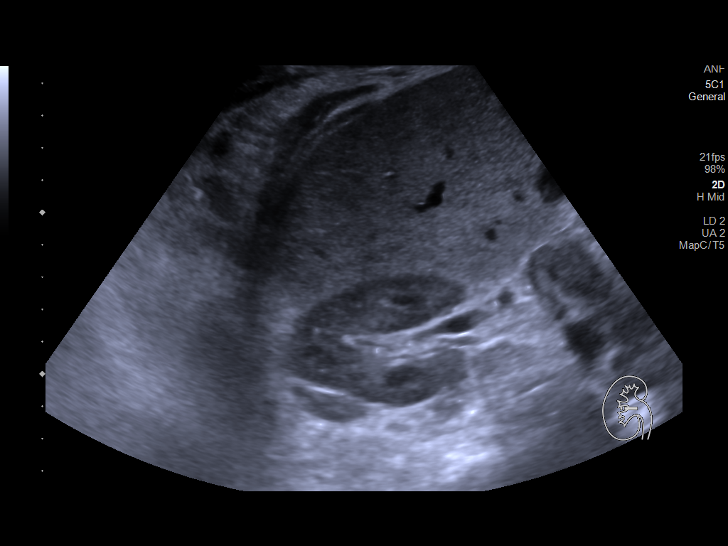
[im 10/28]
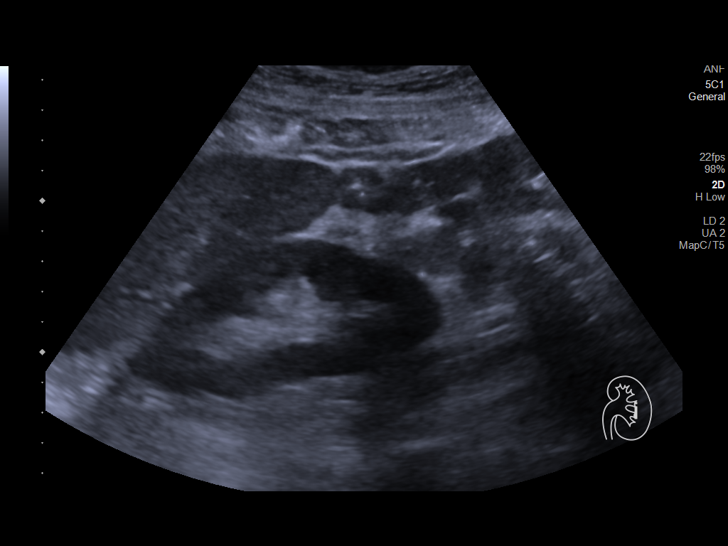
[im 11/28]
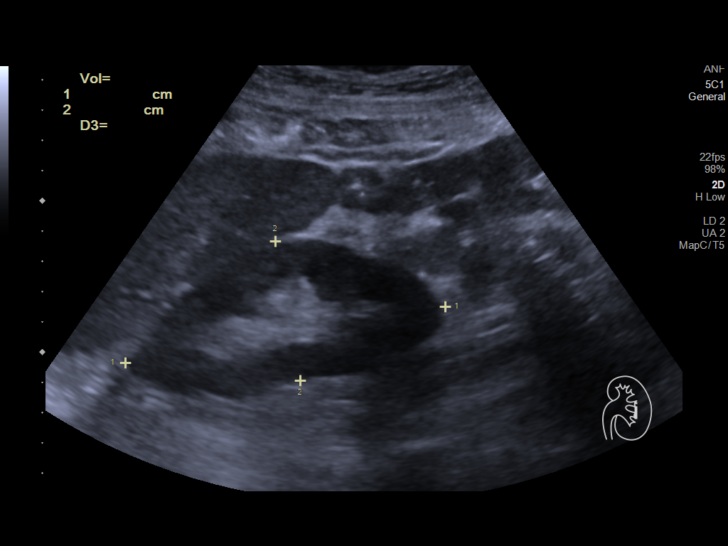
[im 13/28]
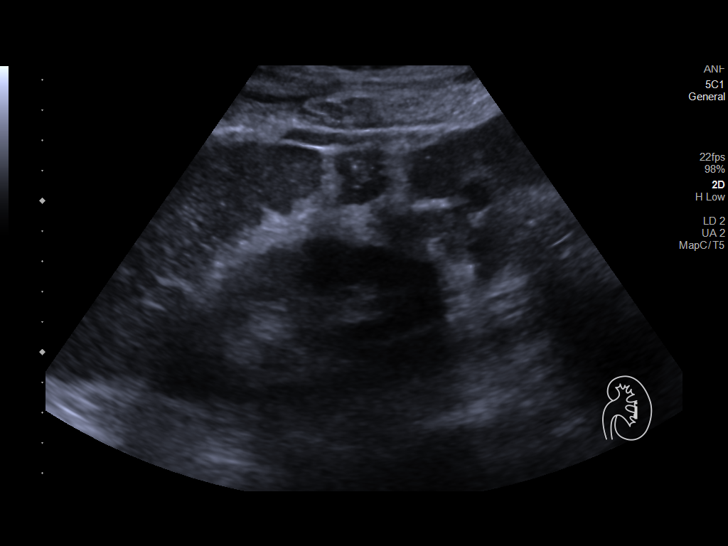
[im 15/28]
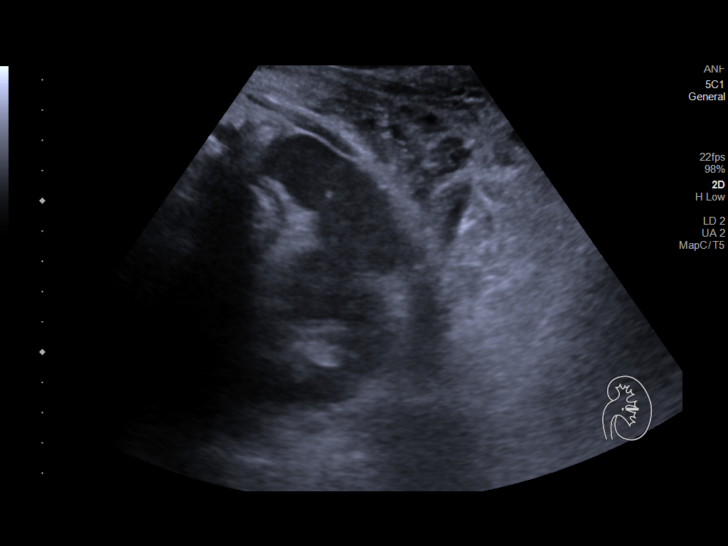
[im 17/28]
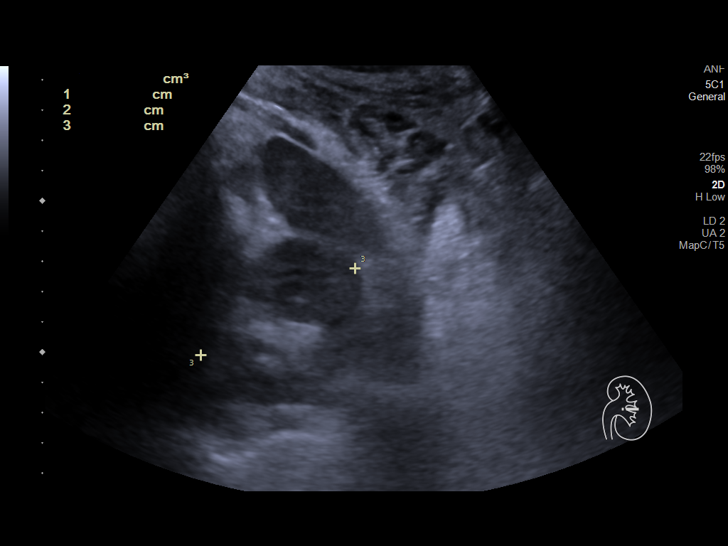
[im 19/28]
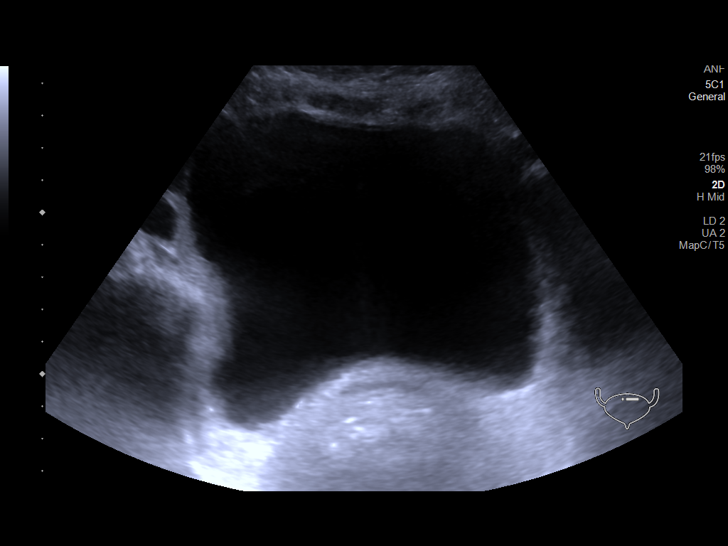
[im 21/28]
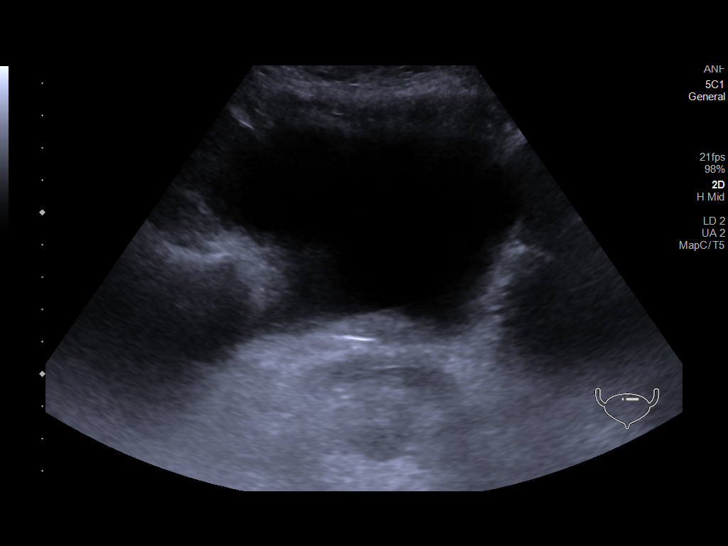
[im 23/28]
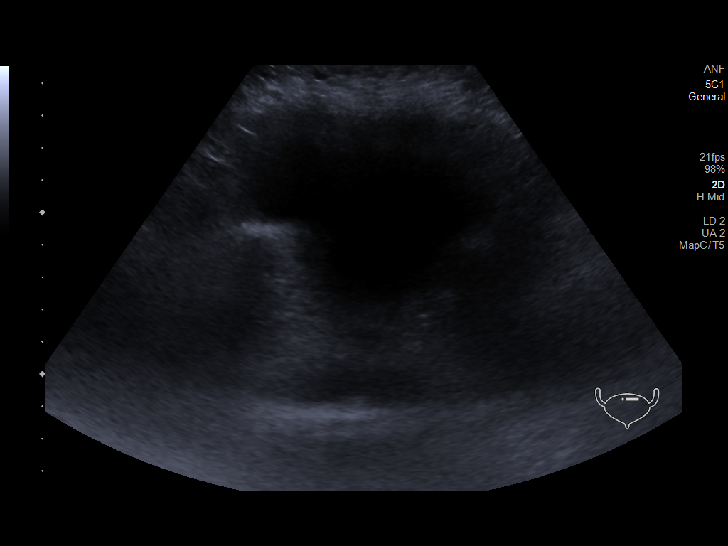
[im 25/28]
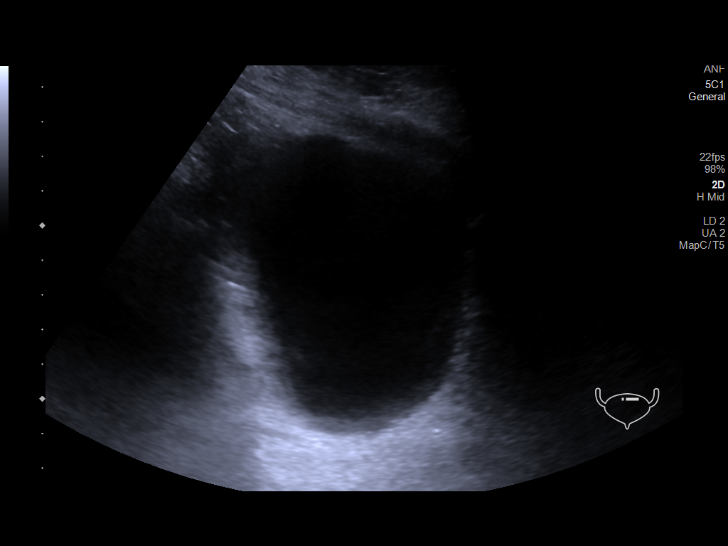
[im 28/28]
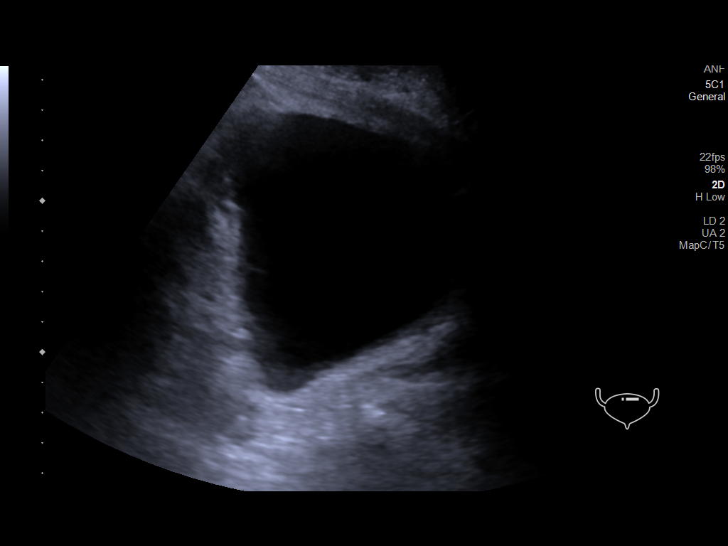

[14 of 25 positions shown; findings below may reference images not displayed]

FINDINGS: Right Kidney:

Renal measurements: 10.9 x 3.9 x 5.8 cm = volume: 128.7 mL .
Echogenicity within normal limits. No mass or hydronephrosis
visualized.

Left Kidney:

Renal measurements: 10.7 x 4.7 x 5.9 cm = volume: 154.0 mL.
Echogenicity within normal limits. No mass or hydronephrosis
visualized.

Bladder:

Appears normal for degree of bladder distention.

Other:

None.
IMPRESSION: Normal study.  No cause for acute renal injury identified.

## 2021-06-03 IMAGING — DX DG CHEST 1V PORT
1 series · 1 of 1 positions shown · non-contrast
Comparison: July 06, 2019

CLINICAL DATA: Pleural effusion

EXAM:
PORTABLE CHEST 1 VIEW

[chest ap]
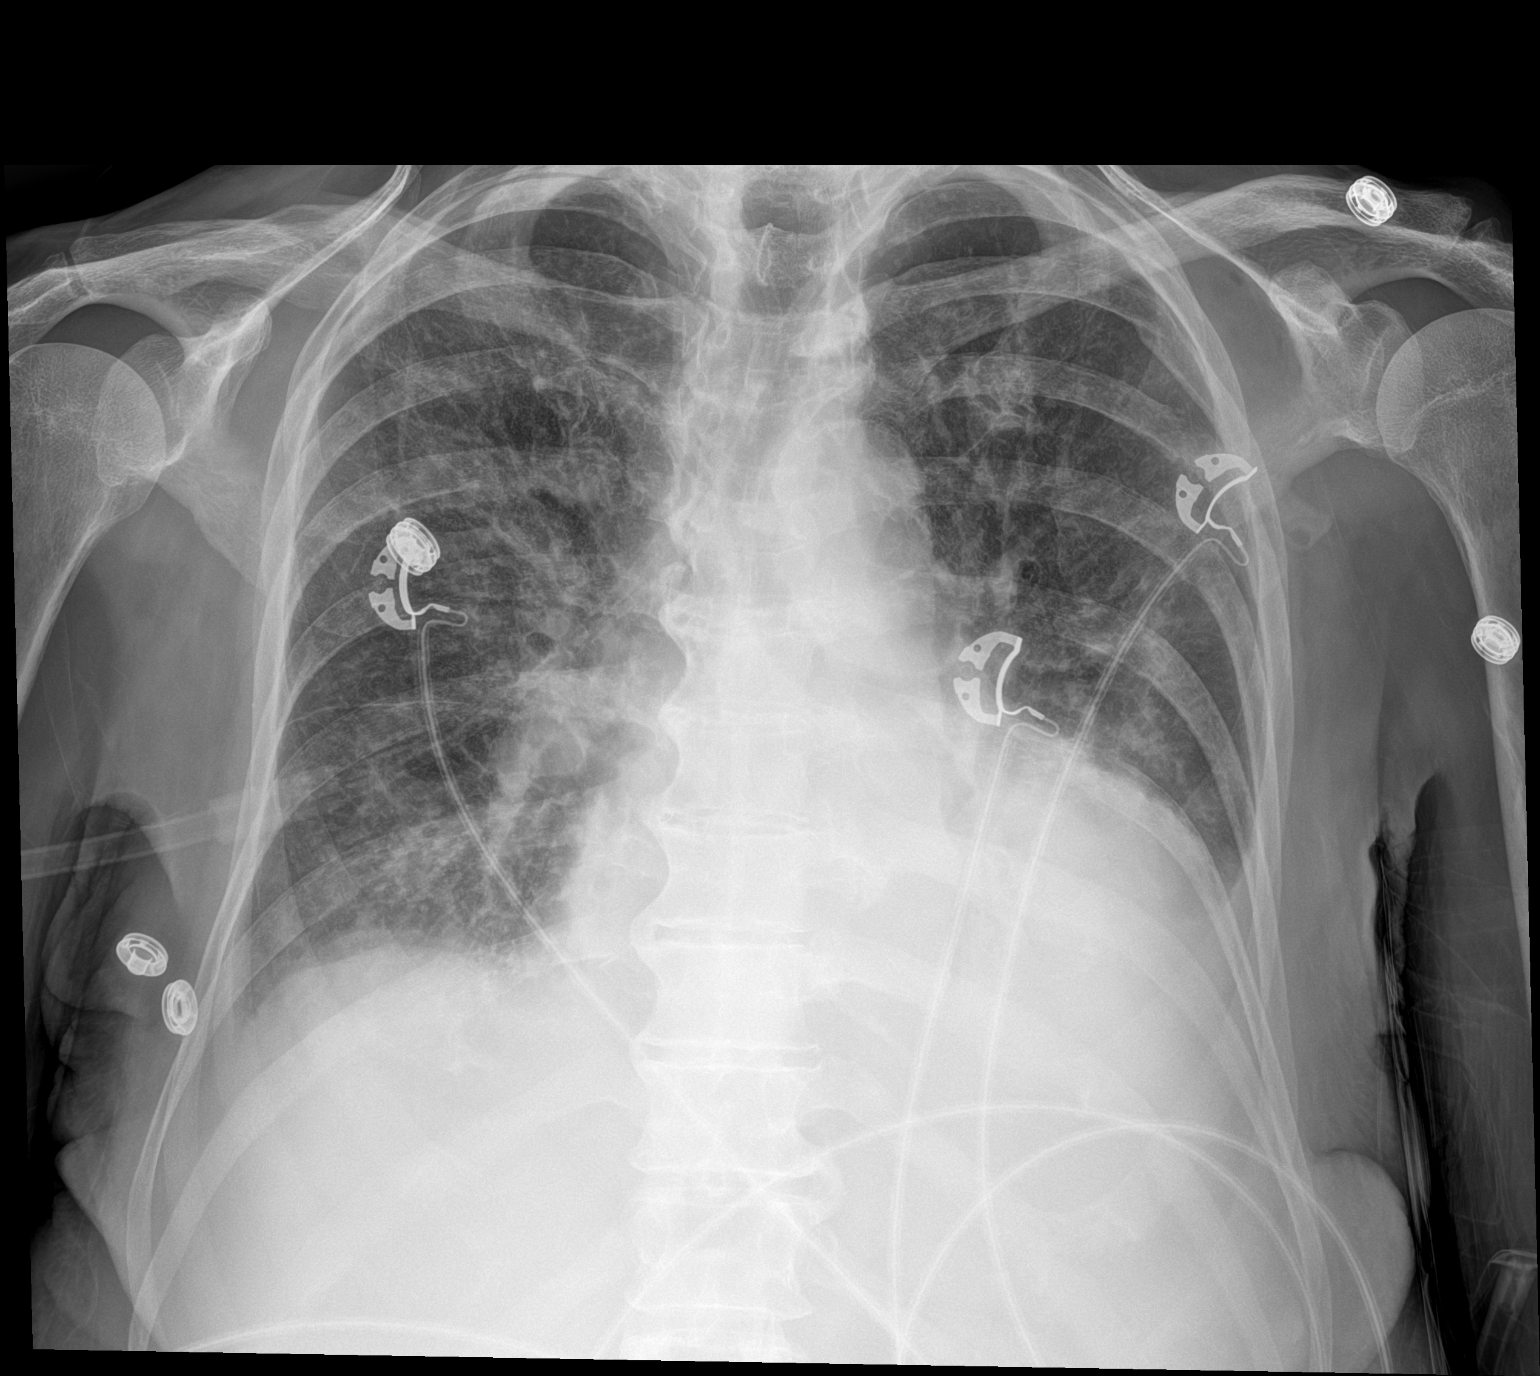

[1 of 1 positions shown; findings below may reference images not displayed]

FINDINGS: No pneumothorax. No change in cardiomegaly. The hila and mediastinum
are stable. Bilateral pulmonary opacities are stable. More focal
opacity in the left retrocardiac region with a probable small
associated effusion is stable. No other abnormalities.
IMPRESSION: 1. No interval change in the small left effusion, focal left
retrocardiac opacity, or diffuse interstitial opacities.

## 2021-07-01 IMAGING — DX DG CHEST 1V PORT
1 series · 1 of 1 positions shown · non-contrast
Comparison: August 01, 2019

CLINICAL DATA: Pleural effusion.

EXAM:
PORTABLE CHEST 1 VIEW

[chest]
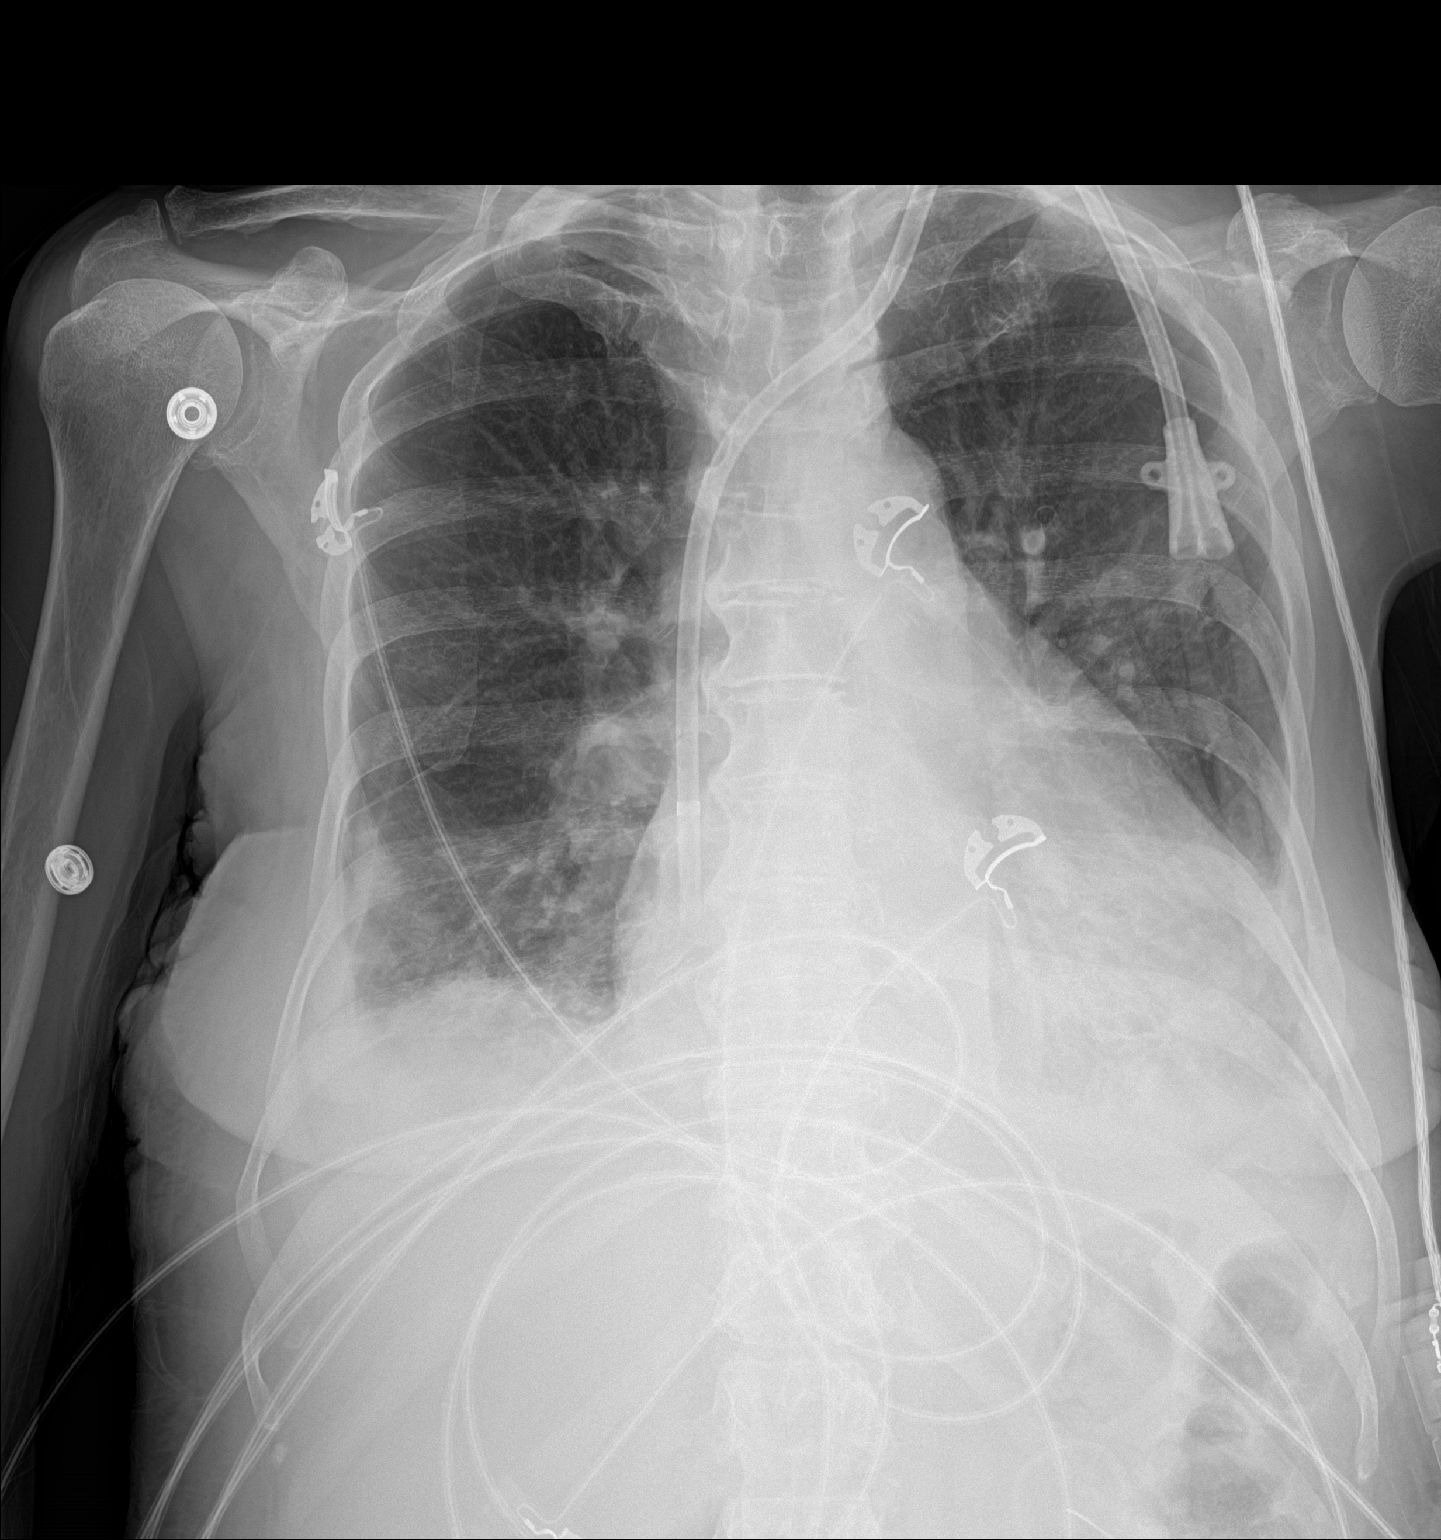

[1 of 1 positions shown; findings below may reference images not displayed]

FINDINGS: Unchanged dialysis catheter.

Persistent enlargement of the cardiac silhouette.

Bilateral pleural effusions. Bilateral lower lobe atelectasis versus
peribronchial airspace consolidation.

Osseous structures are without acute abnormality. Soft tissues are
grossly normal.
IMPRESSION: 1. Persistent enlargement of the cardiac silhouette.
2. Bilateral pleural effusions.
3. Bilateral lower lobe atelectasis versus peribronchial airspace
consolidation.

## 2021-07-03 IMAGING — CT CT CHEST W/O CM
2 of 4 series · 15 of 36 positions shown, 18 images · non-contrast
Comparison: 07/03/2019.

CLINICAL DATA: Evaluate for malignancy.

EXAM:
CT CHEST WITHOUT CONTRAST
TECHNIQUE: Multidetector CT imaging of the chest was performed following the
standard protocol without IV contrast.

[Series 3: chest wo · axial · 0.53mm/px · z∈[-206,+44]mm · 12 of 149 slices shown, 15 images]
[im 12/149  mediastinal]
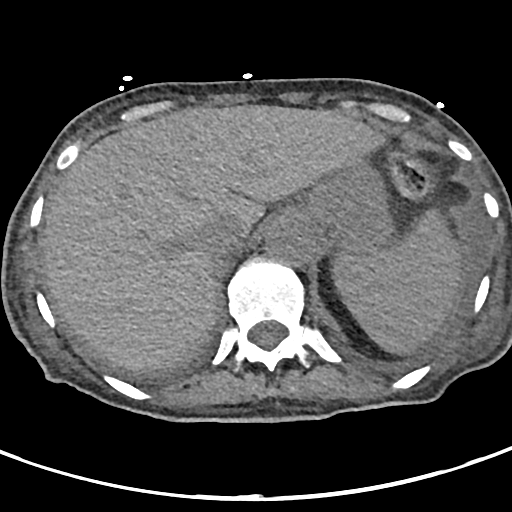
[im 12/149  lung]
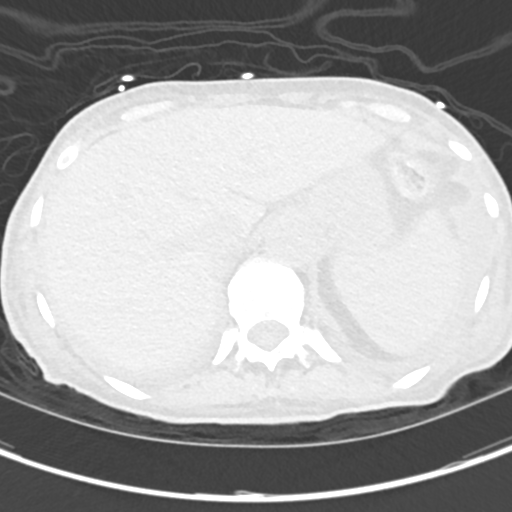
[im 23/149  lung]
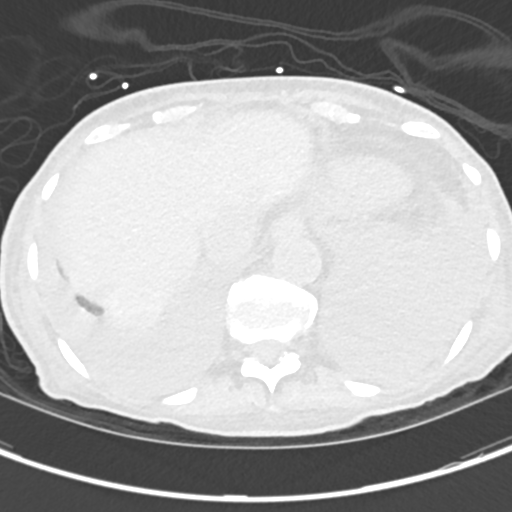
[im 35/149  lung]
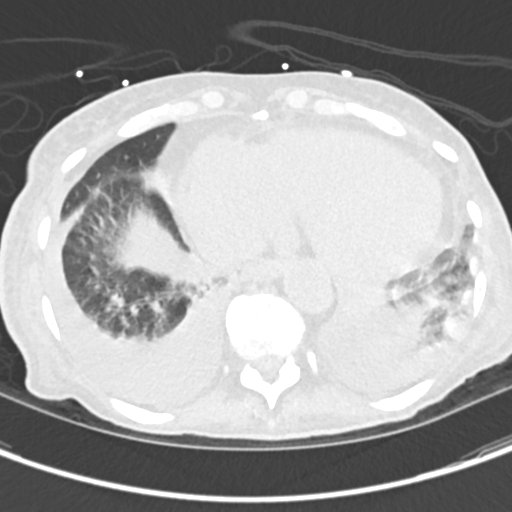
[im 46/149  lung]
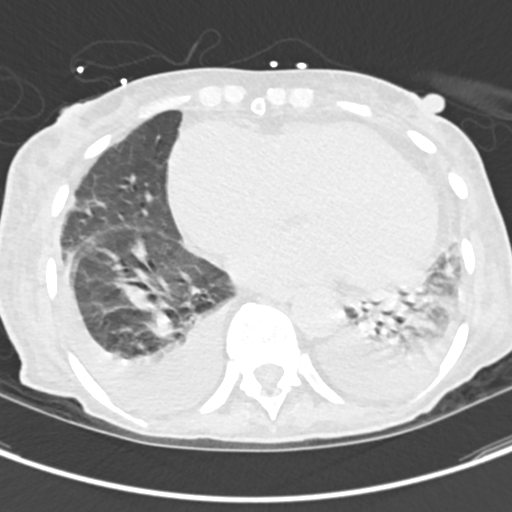
[im 57/149  mediastinal]
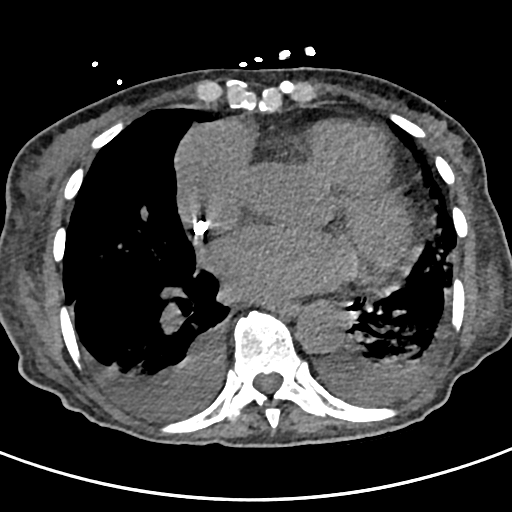
[im 57/149  lung]
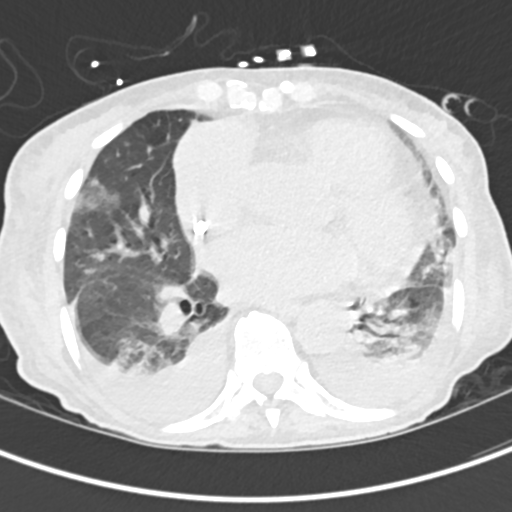
[im 69/149  lung]
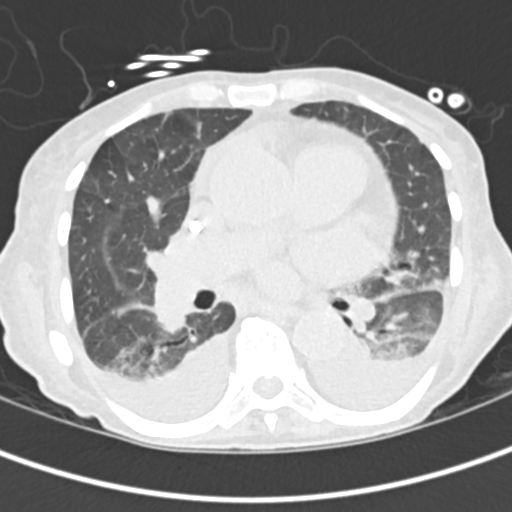
[im 80/149  lung]
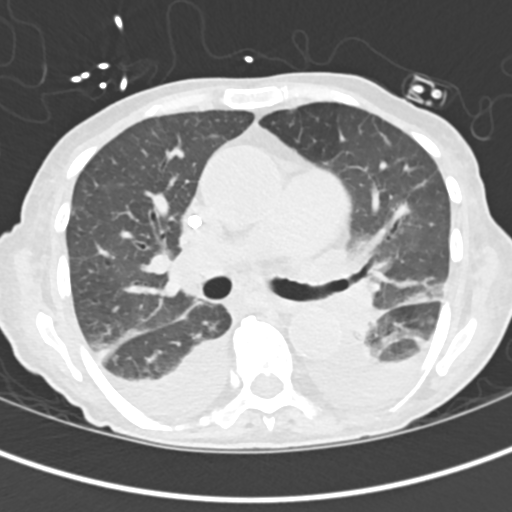
[im 92/149  lung]
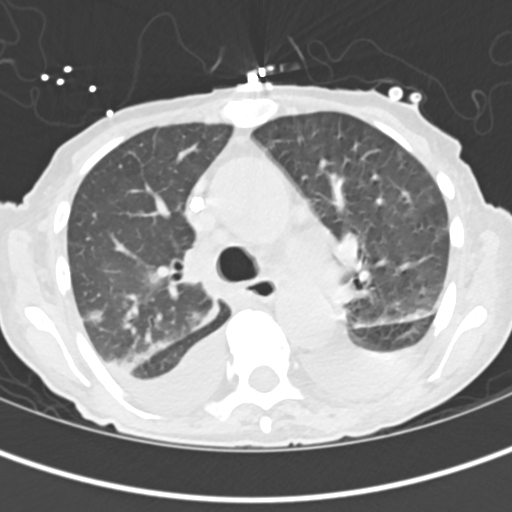
[im 103/149  mediastinal]
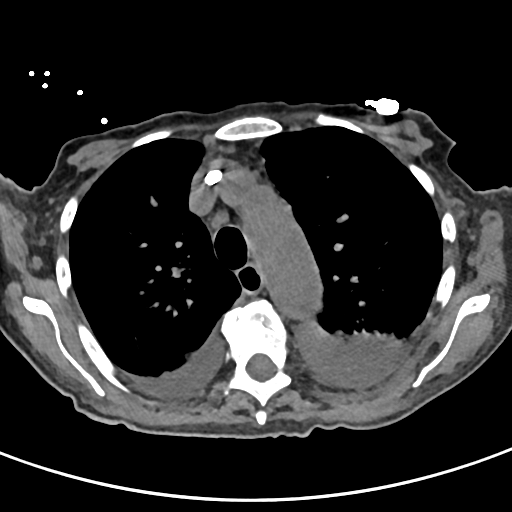
[im 103/149  lung]
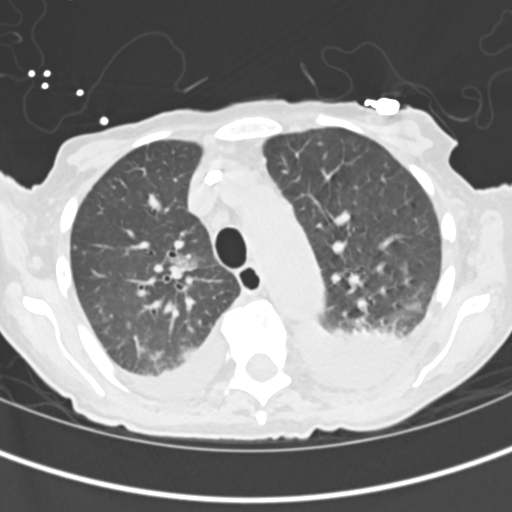
[im 114/149  lung]
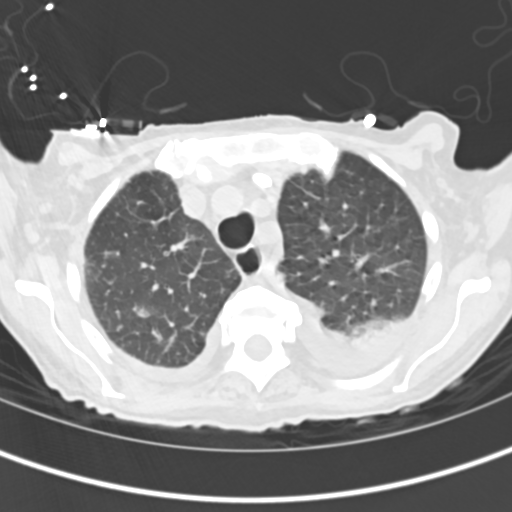
[im 126/149  lung]
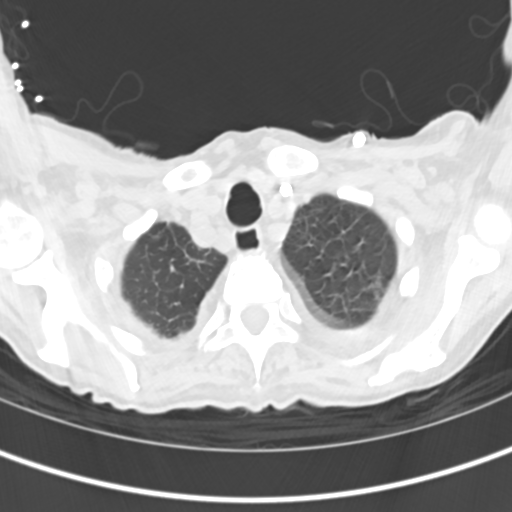
[im 137/149  lung]
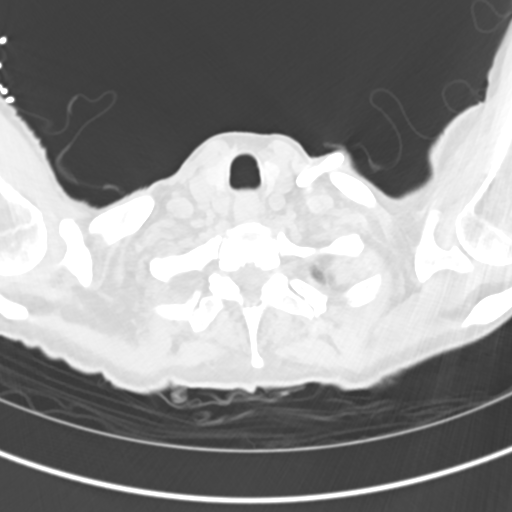

[Series 6: cor · coronal · 0.54mm/px · 3 of 104 slices shown]
[im 21/104  lung]
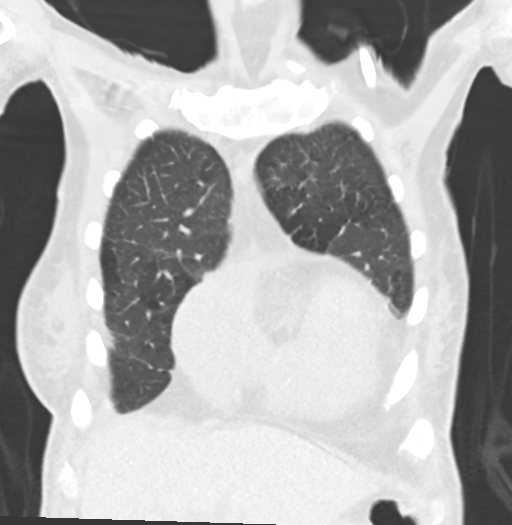
[im 42/104  lung]
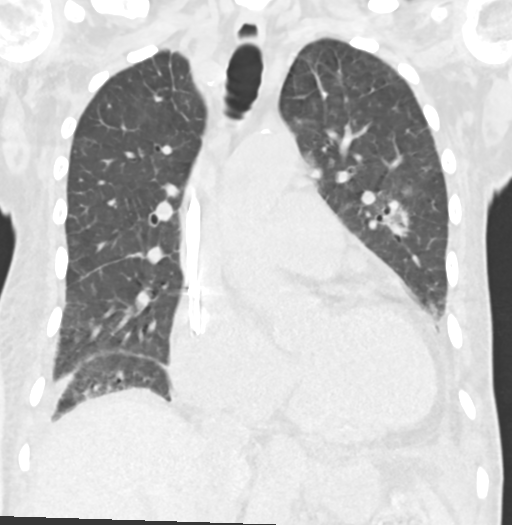
[im 62/104  lung]
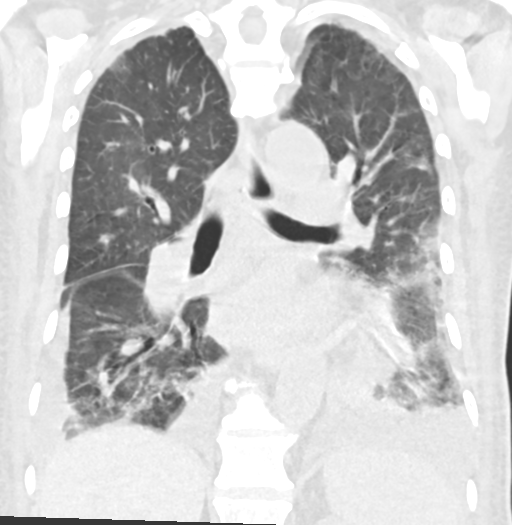

[15 of 36 positions shown; findings below may reference images not displayed]

Bilateral areas of interlobular septal
thickening also noted suggesting interstitial edema. Small solid
nodule within the superior segment of left lower lobe is nonspecific
measuring 4 mm.
FINDINGS: Cardiovascular: There is cardiac enlargement. No pericardial
effusion. Aortic atherosclerosis increase caliber of the ascending
thoracic aorta measures 4.1 cm, image 69/3.The main pulmonary artery
measures 3.5 cm. Prominent bilateral pulmonary arteries noted

Mediastinum/Nodes: Normal appearance of the thyroid gland. The
trachea appears patent and is midline. Normal appearance of the
esophagus. No enlarged mediastinal nodes. The hilar structures are
suboptimally evaluated due to lack of IV contrast material.

Lungs/Pleura: Moderate bilateral pleural effusions are identified.
Multifocal bilateral patchy areas of upper lobe ground-glass
attenuation are noted. This is similar to 07/03/2019.

Upper Abdomen: No acute abnormality.

Musculoskeletal: No chest wall mass or suspicious bone lesions
identified.
IMPRESSION: 1. There is cardiac enlargement, bilateral pleural effusions, and
interlobular septal thickening identified compatible with CHF.
2. Multifocal patchy areas of ground-glass attenuation noted in both
lungs. A similar finding was seen on study from 07/03/2019. Although
nonspecific findings are concerning for multifocal atypical
infection/inflammation.
3. Increased caliber of the main pulmonary artery suggestive of PA
hypertension.
4. Ascending thoracic aortic aneurysm identified. Recommend annual
imaging followup by CTA or MRA. This recommendation follows 9030
ACCF/AHA/AATS/ACR/ASA/SCA/JARRETT/ARISSA/VIN/GORENEC Guidelines for the
Diagnosis and Management of Patients with Thoracic Aortic Disease.
Circulation. 9030; 121: [id number]. Aortic aneurysm NOS (EHVXT-DWU.S)
5.  Aortic Atherosclerosis (EHVXT-9PA.A).
